# Patient Record
Sex: Female | Born: 1950 | Race: White | Hispanic: No | Smoking: Current every day smoker
Health system: Southern US, Community
[De-identification: ages and names within clinical notes are randomized; demographics above are authoritative.]

## PROBLEM LIST (undated history)

## (undated) ENCOUNTER — Emergency Department (HOSPITAL_COMMUNITY): Admission: EM | Payer: Medicare Other | Source: Home / Self Care

## (undated) DIAGNOSIS — Z9989 Dependence on other enabling machines and devices: Secondary | ICD-10-CM

## (undated) DIAGNOSIS — F319 Bipolar disorder, unspecified: Secondary | ICD-10-CM

## (undated) DIAGNOSIS — Z9289 Personal history of other medical treatment: Secondary | ICD-10-CM

## (undated) DIAGNOSIS — R413 Other amnesia: Secondary | ICD-10-CM

## (undated) DIAGNOSIS — G4733 Obstructive sleep apnea (adult) (pediatric): Secondary | ICD-10-CM

## (undated) DIAGNOSIS — F445 Conversion disorder with seizures or convulsions: Secondary | ICD-10-CM

## (undated) DIAGNOSIS — J189 Pneumonia, unspecified organism: Secondary | ICD-10-CM

## (undated) DIAGNOSIS — E039 Hypothyroidism, unspecified: Secondary | ICD-10-CM

## (undated) DIAGNOSIS — F329 Major depressive disorder, single episode, unspecified: Secondary | ICD-10-CM

## (undated) DIAGNOSIS — E785 Hyperlipidemia, unspecified: Secondary | ICD-10-CM

## (undated) DIAGNOSIS — M545 Low back pain, unspecified: Secondary | ICD-10-CM

## (undated) DIAGNOSIS — H3320 Serous retinal detachment, unspecified eye: Secondary | ICD-10-CM

## (undated) DIAGNOSIS — C55 Malignant neoplasm of uterus, part unspecified: Secondary | ICD-10-CM

## (undated) DIAGNOSIS — E119 Type 2 diabetes mellitus without complications: Secondary | ICD-10-CM

## (undated) DIAGNOSIS — F32A Depression, unspecified: Secondary | ICD-10-CM

## (undated) DIAGNOSIS — J42 Unspecified chronic bronchitis: Secondary | ICD-10-CM

## (undated) DIAGNOSIS — F419 Anxiety disorder, unspecified: Secondary | ICD-10-CM

## (undated) DIAGNOSIS — K219 Gastro-esophageal reflux disease without esophagitis: Secondary | ICD-10-CM

## (undated) DIAGNOSIS — B019 Varicella without complication: Secondary | ICD-10-CM

## (undated) DIAGNOSIS — G8929 Other chronic pain: Secondary | ICD-10-CM

## (undated) HISTORY — PX: EYE SURGERY: SHX253

## (undated) HISTORY — DX: Other amnesia: R41.3

## (undated) HISTORY — PX: VAGINAL HYSTERECTOMY: SUR661

## (undated) HISTORY — PX: DILATION AND CURETTAGE OF UTERUS: SHX78

## (undated) HISTORY — PX: BACK SURGERY: SHX140

## (undated) HISTORY — DX: Major depressive disorder, single episode, unspecified: F32.9

## (undated) HISTORY — PX: SALPINGOOPHORECTOMY: SHX82

## (undated) HISTORY — PX: TONSILLECTOMY: SUR1361

## (undated) HISTORY — DX: Varicella without complication: B01.9

## (undated) HISTORY — DX: Depression, unspecified: F32.A

---

## 2001-12-31 ENCOUNTER — Encounter: Admission: RE | Admit: 2001-12-31 | Discharge: 2001-12-31 | Payer: Self-pay | Admitting: Internal Medicine

## 2002-03-04 ENCOUNTER — Encounter: Admission: RE | Admit: 2002-03-04 | Discharge: 2002-03-04 | Payer: Self-pay | Admitting: Internal Medicine

## 2002-03-23 ENCOUNTER — Encounter: Admission: RE | Admit: 2002-03-23 | Discharge: 2002-03-23 | Payer: Self-pay | Admitting: Internal Medicine

## 2002-03-26 ENCOUNTER — Ambulatory Visit (HOSPITAL_COMMUNITY): Admission: RE | Admit: 2002-03-26 | Discharge: 2002-03-26 | Payer: Self-pay | Admitting: Internal Medicine

## 2002-03-26 ENCOUNTER — Encounter: Payer: Self-pay | Admitting: Internal Medicine

## 2002-03-30 ENCOUNTER — Encounter: Admission: RE | Admit: 2002-03-30 | Discharge: 2002-03-30 | Payer: Self-pay | Admitting: Internal Medicine

## 2002-04-15 ENCOUNTER — Encounter: Payer: Self-pay | Admitting: Neurosurgery

## 2002-04-19 ENCOUNTER — Inpatient Hospital Stay (HOSPITAL_COMMUNITY): Admission: RE | Admit: 2002-04-19 | Discharge: 2002-04-21 | Payer: Self-pay | Admitting: Neurosurgery

## 2002-04-19 ENCOUNTER — Encounter: Payer: Self-pay | Admitting: Neurosurgery

## 2002-05-02 ENCOUNTER — Encounter: Admission: RE | Admit: 2002-05-02 | Discharge: 2002-05-02 | Payer: Self-pay | Admitting: Internal Medicine

## 2002-05-02 ENCOUNTER — Ambulatory Visit (HOSPITAL_COMMUNITY): Admission: RE | Admit: 2002-05-02 | Discharge: 2002-05-02 | Payer: Self-pay | Admitting: Internal Medicine

## 2002-05-13 ENCOUNTER — Ambulatory Visit (HOSPITAL_COMMUNITY): Admission: RE | Admit: 2002-05-13 | Discharge: 2002-05-13 | Payer: Self-pay | Admitting: Internal Medicine

## 2002-05-13 ENCOUNTER — Encounter: Admission: RE | Admit: 2002-05-13 | Discharge: 2002-05-13 | Payer: Self-pay | Admitting: Internal Medicine

## 2002-05-16 ENCOUNTER — Encounter: Payer: Self-pay | Admitting: Internal Medicine

## 2002-05-16 ENCOUNTER — Ambulatory Visit (HOSPITAL_COMMUNITY): Admission: RE | Admit: 2002-05-16 | Discharge: 2002-05-16 | Payer: Self-pay | Admitting: Internal Medicine

## 2002-05-31 ENCOUNTER — Encounter: Admission: RE | Admit: 2002-05-31 | Discharge: 2002-08-25 | Payer: Self-pay | Admitting: Neurosurgery

## 2002-06-06 ENCOUNTER — Encounter: Admission: RE | Admit: 2002-06-06 | Discharge: 2002-06-06 | Payer: Self-pay | Admitting: Internal Medicine

## 2002-06-07 ENCOUNTER — Encounter: Admission: RE | Admit: 2002-06-07 | Discharge: 2002-06-07 | Payer: Self-pay | Admitting: Internal Medicine

## 2002-08-03 ENCOUNTER — Encounter: Admission: RE | Admit: 2002-08-03 | Discharge: 2002-08-03 | Payer: Self-pay | Admitting: Internal Medicine

## 2002-10-12 ENCOUNTER — Encounter: Admission: RE | Admit: 2002-10-12 | Discharge: 2002-10-12 | Payer: Self-pay | Admitting: Internal Medicine

## 2002-10-20 ENCOUNTER — Encounter: Admission: RE | Admit: 2002-10-20 | Discharge: 2002-10-20 | Payer: Self-pay | Admitting: Internal Medicine

## 2002-10-25 ENCOUNTER — Emergency Department (HOSPITAL_COMMUNITY): Admission: EM | Admit: 2002-10-25 | Discharge: 2002-10-26 | Payer: Self-pay | Admitting: Emergency Medicine

## 2002-10-26 ENCOUNTER — Encounter: Payer: Self-pay | Admitting: Emergency Medicine

## 2002-11-02 ENCOUNTER — Ambulatory Visit (HOSPITAL_COMMUNITY): Admission: RE | Admit: 2002-11-02 | Discharge: 2002-11-02 | Payer: Self-pay | Admitting: Internal Medicine

## 2002-11-02 ENCOUNTER — Encounter: Payer: Self-pay | Admitting: Cardiology

## 2002-11-02 ENCOUNTER — Encounter: Admission: RE | Admit: 2002-11-02 | Discharge: 2002-11-02 | Payer: Self-pay | Admitting: Internal Medicine

## 2002-11-16 ENCOUNTER — Emergency Department (HOSPITAL_COMMUNITY): Admission: EM | Admit: 2002-11-16 | Discharge: 2002-11-16 | Payer: Self-pay | Admitting: Emergency Medicine

## 2002-12-12 ENCOUNTER — Ambulatory Visit (HOSPITAL_BASED_OUTPATIENT_CLINIC_OR_DEPARTMENT_OTHER): Admission: RE | Admit: 2002-12-12 | Discharge: 2002-12-12 | Payer: Self-pay | Admitting: Internal Medicine

## 2002-12-16 ENCOUNTER — Encounter: Admission: RE | Admit: 2002-12-16 | Discharge: 2002-12-16 | Payer: Self-pay | Admitting: Internal Medicine

## 2003-02-20 ENCOUNTER — Emergency Department (HOSPITAL_COMMUNITY): Admission: EM | Admit: 2003-02-20 | Discharge: 2003-02-20 | Payer: Self-pay | Admitting: Emergency Medicine

## 2003-02-27 ENCOUNTER — Encounter: Admission: RE | Admit: 2003-02-27 | Discharge: 2003-02-27 | Payer: Self-pay | Admitting: Internal Medicine

## 2003-03-01 ENCOUNTER — Ambulatory Visit (HOSPITAL_COMMUNITY): Admission: RE | Admit: 2003-03-01 | Discharge: 2003-03-01 | Payer: Self-pay | Admitting: Internal Medicine

## 2003-03-08 ENCOUNTER — Encounter: Admission: RE | Admit: 2003-03-08 | Discharge: 2003-03-08 | Payer: Self-pay | Admitting: Internal Medicine

## 2003-03-09 ENCOUNTER — Ambulatory Visit (HOSPITAL_COMMUNITY): Admission: RE | Admit: 2003-03-09 | Discharge: 2003-03-09 | Payer: Self-pay | Admitting: Internal Medicine

## 2003-04-10 ENCOUNTER — Encounter: Admission: RE | Admit: 2003-04-10 | Discharge: 2003-04-10 | Payer: Self-pay | Admitting: Internal Medicine

## 2003-04-27 ENCOUNTER — Encounter: Admission: RE | Admit: 2003-04-27 | Discharge: 2003-04-27 | Payer: Self-pay | Admitting: Neurosurgery

## 2003-05-16 ENCOUNTER — Encounter: Admission: RE | Admit: 2003-05-16 | Discharge: 2003-05-16 | Payer: Self-pay | Admitting: Neurosurgery

## 2003-07-13 ENCOUNTER — Inpatient Hospital Stay (HOSPITAL_COMMUNITY): Admission: AD | Admit: 2003-07-13 | Discharge: 2003-07-16 | Payer: Self-pay | Admitting: Internal Medicine

## 2003-07-19 ENCOUNTER — Encounter: Admission: RE | Admit: 2003-07-19 | Discharge: 2003-07-19 | Payer: Self-pay | Admitting: Internal Medicine

## 2003-09-28 ENCOUNTER — Encounter: Admission: RE | Admit: 2003-09-28 | Discharge: 2003-09-28 | Payer: Self-pay | Admitting: Internal Medicine

## 2003-10-17 ENCOUNTER — Encounter: Admission: RE | Admit: 2003-10-17 | Discharge: 2003-10-17 | Payer: Self-pay | Admitting: Internal Medicine

## 2004-02-28 ENCOUNTER — Encounter: Admission: RE | Admit: 2004-02-28 | Discharge: 2004-02-28 | Payer: Self-pay | Admitting: Neurosurgery

## 2004-03-07 ENCOUNTER — Emergency Department (HOSPITAL_COMMUNITY): Admission: EM | Admit: 2004-03-07 | Discharge: 2004-03-07 | Payer: Self-pay | Admitting: Emergency Medicine

## 2004-03-23 ENCOUNTER — Ambulatory Visit: Payer: Self-pay | Admitting: Internal Medicine

## 2004-03-23 ENCOUNTER — Inpatient Hospital Stay (HOSPITAL_COMMUNITY): Admission: EM | Admit: 2004-03-23 | Discharge: 2004-03-26 | Payer: Self-pay | Admitting: Emergency Medicine

## 2004-05-14 ENCOUNTER — Ambulatory Visit: Payer: Self-pay | Admitting: Internal Medicine

## 2004-10-17 ENCOUNTER — Emergency Department (HOSPITAL_COMMUNITY): Admission: EM | Admit: 2004-10-17 | Discharge: 2004-10-18 | Payer: Self-pay | Admitting: Emergency Medicine

## 2004-10-22 ENCOUNTER — Ambulatory Visit: Payer: Self-pay | Admitting: Internal Medicine

## 2004-10-22 ENCOUNTER — Inpatient Hospital Stay (HOSPITAL_COMMUNITY): Admission: AD | Admit: 2004-10-22 | Discharge: 2004-10-23 | Payer: Self-pay | Admitting: Internal Medicine

## 2004-10-23 ENCOUNTER — Ambulatory Visit: Payer: Self-pay | Admitting: Internal Medicine

## 2004-10-23 ENCOUNTER — Ambulatory Visit: Payer: Self-pay | Admitting: Cardiovascular Disease

## 2004-10-29 ENCOUNTER — Ambulatory Visit: Payer: Self-pay | Admitting: Internal Medicine

## 2004-12-06 ENCOUNTER — Emergency Department (HOSPITAL_COMMUNITY): Admission: EM | Admit: 2004-12-06 | Discharge: 2004-12-06 | Payer: Self-pay | Admitting: Emergency Medicine

## 2005-02-18 ENCOUNTER — Encounter: Admission: RE | Admit: 2005-02-18 | Discharge: 2005-02-18 | Payer: Self-pay | Admitting: Neurosurgery

## 2005-03-07 ENCOUNTER — Inpatient Hospital Stay (HOSPITAL_COMMUNITY): Admission: EM | Admit: 2005-03-07 | Discharge: 2005-03-17 | Payer: Self-pay | Admitting: Psychiatry

## 2005-03-08 ENCOUNTER — Ambulatory Visit: Payer: Self-pay | Admitting: Psychiatry

## 2005-04-04 ENCOUNTER — Emergency Department (HOSPITAL_COMMUNITY): Admission: EM | Admit: 2005-04-04 | Discharge: 2005-04-04 | Payer: Self-pay | Admitting: Emergency Medicine

## 2005-04-18 ENCOUNTER — Emergency Department (HOSPITAL_COMMUNITY): Admission: EM | Admit: 2005-04-18 | Discharge: 2005-04-18 | Payer: Self-pay | Admitting: Family Medicine

## 2005-05-10 ENCOUNTER — Emergency Department (HOSPITAL_COMMUNITY): Admission: EM | Admit: 2005-05-10 | Discharge: 2005-05-11 | Payer: Self-pay | Admitting: Emergency Medicine

## 2005-07-02 ENCOUNTER — Emergency Department (HOSPITAL_COMMUNITY): Admission: EM | Admit: 2005-07-02 | Discharge: 2005-07-03 | Payer: Self-pay | Admitting: Emergency Medicine

## 2006-02-18 ENCOUNTER — Encounter: Admission: RE | Admit: 2006-02-18 | Discharge: 2006-02-18 | Payer: Self-pay | Admitting: Neurosurgery

## 2006-05-11 ENCOUNTER — Other Ambulatory Visit: Admission: RE | Admit: 2006-05-11 | Discharge: 2006-05-11 | Payer: Self-pay | Admitting: Obstetrics and Gynecology

## 2006-05-13 ENCOUNTER — Emergency Department (HOSPITAL_COMMUNITY): Admission: EM | Admit: 2006-05-13 | Discharge: 2006-05-14 | Payer: Self-pay | Admitting: Emergency Medicine

## 2006-05-22 ENCOUNTER — Ambulatory Visit: Payer: Self-pay | Admitting: *Deleted

## 2006-05-22 ENCOUNTER — Inpatient Hospital Stay (HOSPITAL_COMMUNITY): Admission: AD | Admit: 2006-05-22 | Discharge: 2006-05-26 | Payer: Self-pay | Admitting: *Deleted

## 2006-08-17 ENCOUNTER — Emergency Department (HOSPITAL_COMMUNITY): Admission: EM | Admit: 2006-08-17 | Discharge: 2006-08-17 | Payer: Self-pay | Admitting: Family Medicine

## 2007-06-14 ENCOUNTER — Encounter (INDEPENDENT_AMBULATORY_CARE_PROVIDER_SITE_OTHER): Payer: Self-pay | Admitting: Family Medicine

## 2007-06-14 ENCOUNTER — Ambulatory Visit: Payer: Self-pay | Admitting: Internal Medicine

## 2007-06-14 LAB — CONVERTED CEMR LAB
AST: 42 units/L — ABNORMAL HIGH (ref 0–37)
Albumin: 4.3 g/dL (ref 3.5–5.2)
Alkaline Phosphatase: 49 units/L (ref 39–117)
BUN: 25 mg/dL — ABNORMAL HIGH (ref 6–23)
Basophils Relative: 0 % (ref 0–1)
CO2: 23 meq/L (ref 19–32)
Cholesterol: 279 mg/dL — ABNORMAL HIGH (ref 0–200)
Creatinine, Ser: 0.66 mg/dL (ref 0.40–1.20)
Eosinophils Absolute: 0.1 10*3/uL (ref 0.0–0.7)
Glucose, Bld: 120 mg/dL — ABNORMAL HIGH (ref 70–99)
HCT: 38.8 % (ref 36.0–46.0)
Hemoglobin: 12.4 g/dL (ref 12.0–15.0)
LDL Cholesterol: 172 mg/dL — ABNORMAL HIGH (ref 0–99)
Lymphocytes Relative: 34 % (ref 12–46)
Monocytes Absolute: 0.5 10*3/uL (ref 0.1–1.0)
Neutro Abs: 4.4 10*3/uL (ref 1.7–7.7)
Neutrophils Relative %: 58 % (ref 43–77)
Platelets: 210 10*3/uL (ref 150–400)
RDW: 12.9 % (ref 11.5–15.5)
Sodium: 141 meq/L (ref 135–145)
Total Bilirubin: 0.3 mg/dL (ref 0.3–1.2)
Triglycerides: 276 mg/dL — ABNORMAL HIGH (ref <150)

## 2007-06-16 ENCOUNTER — Encounter (INDEPENDENT_AMBULATORY_CARE_PROVIDER_SITE_OTHER): Payer: Self-pay | Admitting: Family Medicine

## 2007-06-16 LAB — CONVERTED CEMR LAB
HCV Ab: NEGATIVE
Hepatitis B-Post: 348 milliintl units/mL

## 2007-06-18 ENCOUNTER — Encounter (INDEPENDENT_AMBULATORY_CARE_PROVIDER_SITE_OTHER): Payer: Self-pay | Admitting: Family Medicine

## 2007-08-03 ENCOUNTER — Ambulatory Visit: Payer: Self-pay | Admitting: Internal Medicine

## 2007-08-03 ENCOUNTER — Encounter (INDEPENDENT_AMBULATORY_CARE_PROVIDER_SITE_OTHER): Payer: Self-pay | Admitting: Family Medicine

## 2007-08-03 LAB — CONVERTED CEMR LAB
Total CHOL/HDL Ratio: 5.3
Triglycerides: 256 mg/dL — ABNORMAL HIGH (ref <150)

## 2007-08-18 ENCOUNTER — Encounter: Payer: Self-pay | Admitting: Family Medicine

## 2007-08-18 ENCOUNTER — Ambulatory Visit: Payer: Self-pay | Admitting: Internal Medicine

## 2007-08-18 LAB — CONVERTED CEMR LAB
Alkaline Phosphatase: 56 units/L (ref 39–117)
Basophils Relative: 0 % (ref 0–1)
CO2: 24 meq/L (ref 19–32)
Chloride: 102 meq/L (ref 96–112)
Creatinine, Ser: 0.84 mg/dL (ref 0.40–1.20)
Eosinophils Relative: 1 % (ref 0–5)
Glucose, Bld: 99 mg/dL (ref 70–99)
HCT: 42.7 % (ref 36.0–46.0)
MCV: 97.9 fL (ref 78.0–100.0)
Monocytes Relative: 10 % (ref 3–12)
RBC: 4.36 M/uL (ref 3.87–5.11)
Sed Rate: 2 mm/hr (ref 0–22)
Sodium: 140 meq/L (ref 135–145)
TSH: 2.571 microintl units/mL (ref 0.350–4.50)
Total Protein: 7.6 g/dL (ref 6.0–8.3)
Vitamin B-12: 550 pg/mL (ref 211–911)
WBC: 7 10*3/uL (ref 4.0–10.5)

## 2007-08-26 ENCOUNTER — Ambulatory Visit (HOSPITAL_COMMUNITY): Admission: RE | Admit: 2007-08-26 | Discharge: 2007-08-26 | Payer: Self-pay | Admitting: Family Medicine

## 2007-09-14 ENCOUNTER — Ambulatory Visit: Payer: Self-pay | Admitting: Internal Medicine

## 2007-09-14 ENCOUNTER — Encounter: Payer: Self-pay | Admitting: Family Medicine

## 2007-09-17 ENCOUNTER — Encounter: Admission: RE | Admit: 2007-09-17 | Discharge: 2007-09-17 | Payer: Self-pay | Admitting: Family Medicine

## 2008-01-30 ENCOUNTER — Inpatient Hospital Stay (HOSPITAL_COMMUNITY): Admission: EM | Admit: 2008-01-30 | Discharge: 2008-01-31 | Payer: Self-pay | Admitting: Emergency Medicine

## 2008-05-08 ENCOUNTER — Ambulatory Visit: Payer: Self-pay | Admitting: Internal Medicine

## 2008-05-08 ENCOUNTER — Encounter: Payer: Self-pay | Admitting: Family Medicine

## 2008-05-08 LAB — CONVERTED CEMR LAB
ALT: 42 units/L — ABNORMAL HIGH (ref 0–35)
AST: 29 units/L (ref 0–37)
BUN: 20 mg/dL (ref 6–23)
Basophils Relative: 0 % (ref 0–1)
Chloride: 105 meq/L (ref 96–112)
Creatinine, Ser: 0.65 mg/dL (ref 0.40–1.20)
Eosinophils Absolute: 0.1 10*3/uL (ref 0.0–0.7)
Eosinophils Relative: 2 % (ref 0–5)
HCT: 41.6 % (ref 36.0–46.0)
Lymphocytes Relative: 33 % (ref 12–46)
Lymphs Abs: 2.4 10*3/uL (ref 0.7–4.0)
MCV: 95.6 fL (ref 78.0–100.0)
Monocytes Absolute: 0.7 10*3/uL (ref 0.1–1.0)
Monocytes Relative: 9 % (ref 3–12)
Neutro Abs: 4.2 10*3/uL (ref 1.7–7.7)
Neutrophils Relative %: 57 % (ref 43–77)
Potassium: 4.8 meq/L (ref 3.5–5.3)
RDW: 13.5 % (ref 11.5–15.5)
Valproic Acid Lvl: 132.2 ug/mL — ABNORMAL HIGH (ref 50.0–100.0)

## 2008-07-21 ENCOUNTER — Ambulatory Visit: Payer: Self-pay | Admitting: Internal Medicine

## 2008-09-29 ENCOUNTER — Emergency Department (HOSPITAL_COMMUNITY): Admission: EM | Admit: 2008-09-29 | Discharge: 2008-09-29 | Payer: Self-pay | Admitting: Emergency Medicine

## 2009-03-19 ENCOUNTER — Emergency Department (HOSPITAL_COMMUNITY): Admission: EM | Admit: 2009-03-19 | Discharge: 2009-03-20 | Payer: Self-pay | Admitting: Emergency Medicine

## 2009-04-05 DIAGNOSIS — G4730 Sleep apnea, unspecified: Secondary | ICD-10-CM | POA: Insufficient documentation

## 2009-08-08 ENCOUNTER — Encounter: Admission: RE | Admit: 2009-08-08 | Discharge: 2009-08-08 | Payer: Self-pay | Admitting: Family Medicine

## 2009-09-13 DIAGNOSIS — H548 Legal blindness, as defined in USA: Secondary | ICD-10-CM | POA: Insufficient documentation

## 2010-03-10 ENCOUNTER — Encounter: Payer: Self-pay | Admitting: Obstetrics and Gynecology

## 2010-03-10 ENCOUNTER — Encounter: Payer: Self-pay | Admitting: Neurosurgery

## 2010-05-08 LAB — URINE CULTURE

## 2010-05-08 LAB — URINE MICROSCOPIC-ADD ON

## 2010-05-08 LAB — URINALYSIS, ROUTINE W REFLEX MICROSCOPIC
Hgb urine dipstick: NEGATIVE
Ketones, ur: 15 mg/dL — AB
Nitrite: NEGATIVE
Protein, ur: NEGATIVE mg/dL
Specific Gravity, Urine: 1.017 (ref 1.005–1.030)
Urobilinogen, UA: 0.2 mg/dL (ref 0.0–1.0)
pH: 6.5 (ref 5.0–8.0)
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
## 2010-07-05 NOTE — Op Note (Signed)
NAME:  Kelsey Hanna, Kelsey Hanna                         ACCOUNT NO.:  [id number]   MEDICAL RECORD NO.:  [id number]                   PATIENT TYPE:  INP   LOCATION:  2899                                 FACILITY:  [facility]   PHYSICIAN:  Hilda Lias, M.D.                DATE OF BIRTH:  Oct 08, 1950   DATE OF PROCEDURE:  04/19/2002  DATE OF DISCHARGE:                                 OPERATIVE REPORT   PREOPERATIVE DIAGNOSIS:  L5-S1 herniated disk bilaterally, left worse than  right with 1/5 to 2/5 weakness of dorsiflexion and plantar flexion of the  left foot.   POSTOPERATIVE DIAGNOSIS:  L5-S1 herniated disk bilaterally, left worse than  right with 1/5 to 2/5 weakness of dorsiflexion and plantar flexion of the  left foot.   PROCEDURE:  Bilateral L5-S1 diskectomy, foraminotomy, decompression of the  L4 and L5 nerve roots, microscope.   SURGEON:  Hilda Lias, M.D.   INDICATIONS FOR PROCEDURE:  The patient is admitted because of back pain  with radiation to both legs, left worse than right, associated with weakness  of dorsiflexion and plantar flexion.  MRI showed herniated disk compromising  the L5 and S1 nerve roots.  Surgery was advised and the risks were explained  in history and physical.   DESCRIPTION OF PROCEDURE:  The patient was taken to the OR and positioned in  a prone manner.  X-ray had to be taken because she is quite a heavy lady.  Indeed this showed that we were at the level of 5-1.  Then an incision in  the midline through the skin, subcutaneous tissue, and fascia was done.  Muscle was retracted bilaterally.  First on the right side, we were able to  see the L5-S1 space.  The procedure was done on the left side.  We brought  the drill and we drilled the lower lamina of L5 and the upper of S1.  Because the pain and weakness were worse on the right side, we started on  the right side.  The yellow ligament was also excised. This was done with  the microscope. We found that the  S1 nerve root and the L5 were conjoint.  Indeed there was a herniated disk at 5-1 compromising the takeoff of S1 and  also the takeoff of L5.  Retraction was done after lysis and incision was  made.  Large amounts of degenerative disk were removed. There was a lot of  osteophyte compromising the L5 nerve root.  Median and lateral diskectomy  was achieved. The same procedure was done on the left side when the disk was  also opened and total gross diskectomy was achieved. At the end we had good  decompression with plenty of room for the L5 and S1 nerve roots bilaterally.  Valsalva maneuver was negative.  From then on, the area was irrigated.  Depo-  Medrol was placed and the wound was closed with  Vicryl and Steri-Strips.                                               Hilda Lias, M.D.    EB/MEDQ  D:  04/19/2002  T:  04/19/2002  Job:  [id number]

## 2010-07-05 NOTE — H&P (Signed)
NAME:  Kelsey Hanna, Kelsey Hanna NO.:  [id number]   MEDICAL RECORD NO.:  [id number]          PATIENT TYPE:  IPS   LOCATION:  0401                          [facility]ILITY[facility]  [facility]   PHYSICIAN:  Jasmine Pang, M.D. DATE OF BIRTH:  April 09, 1950   DATE OF ADMISSION:  05/22/2006  DATE OF DISCHARGE:                       PSYCHIATRIC ADMISSION ASSESSMENT   IDENTIFYING INFORMATION:  This is a voluntary admission to the services  of Dr. Milford Cage.  This is a 60 year old divorced white female.  Apparently, she is at [facility] and was sent for a  medical clearance to the ED at [facility].  Apparently, she had been  calling her psychiatrist, Dr. Rosalia Hammers, stating that she could not rest, I  can hear everything, I can understand other languages with my hands.  The patient has a significant history for chronic back pain,  hypertension, hypothyroidism and is known to be bipolar.   PAST PSYCHIATRIC HISTORY:  She was last with [place] March 07, 2005 to  March 17, 2005.  At that time, she had become noncompliant with her  Depakote and was also manic at that time.   SOCIAL HISTORY:  She states that she went to the 12th grade.  She has  been married four times.  She has a son and a daughter.  She is not sure  of their ages.  She claims that she does not know anything.  According  to her past record here, it states that she had been living with her  daughter for up to three years and also had been living in a group home  and may be unable to continue living with her daughter and need to find  appropriate placement.  This was back in 2007.  Her hospitalization was  in [facility] at [facility] as far as we can tell and she was noted to be  allergic to LITHIUM.   FAMILY HISTORY:  She denies.   ALCOHOL/DRUG HISTORY:  She is in an assisted-living.  She does not have  access.   PRIMARY CARE PHYSICIAN:  Primary care Aadhya Bustamante is a Dr. Redmond School.  Her  psychiatrist is Dr. Rosalia Hammers.   MEDICAL  PROBLEMS:  Hypothyroidism and sleep apnea.   MEDICATIONS:  Numerous.  She is on tramadol 50 mg, 1 tablet t.i.d.,  meclizine 12.5 mg, 1 tablet t.i.d., Calcarb 600 with Vitamin D, 1 tablet  t.i.d., simvastatin 10 mg, 1 at h.s., Fiber-Lax 1 tab q.d. with 80  ounces of water, diphenhydramine 50 mg, take 1 tablet by mouth as needed  for nausea, fluoxetine 40 mg every morning, Levothroid 150 mcg, 1 tablet  by mouth q.d., Topamax 100 mg, take 1 b.i.d., daily-Vite 1 p.o. q.d.,  gabapentin 600 mg b.i.d., arthritis pain relief 650 mg, take 1 tablet by  mouth q.12h., diclofenac or Voltaren 75 mg b.i.d., docusate 100 mg  b.i.d.   ALLERGIES:  LITHIUM.   POSITIVE PHYSICAL FINDINGS:  She was medically cleared in the ED at  [facility].  There were no remarkable findings.  Her vital  signs on admission show she is 61 inches tall, she weighs 171  pounds,  temperature 97.3, blood pressure 149/92, pulse 99, respirations 20.  She  does have a lumbar area scar consistent with her history for back  surgery.   LABORATORY DATA:  Her labs were unremarkable.   MENTAL STATUS EXAM:  She is alert.  She is not oriented.  She is  appropriately groomed, dressed and nourished.  Her speech is rapid.  She  has some flight of ideas.  Her mood is labile.  Her thought processes  are not clear, rational and goal-oriented.  Judgment and insight are  poor.  Concentration and memory are poor.  Intelligence is at least  average.  She denies being suicidal or homicidal at the moment.  She is  denying any auditory or visual hallucinations.  She is not responding to  any hallucinations nor does she demonstrate tactile hallucinations at  the moment.   DIAGNOSES:  AXIS I:  Bipolar, manic with psychotic features, auditory  hallucinations and tactile hallucinations.  AXIS II:  Deferred.  AXIS III:  Hypothyroidism and sleep apnea.  AXIS IV:  Problems with primary support group.  AXIS V:  25.   PLAN:  We will adjust her  medications.  Toward that end, we will add  some Keppra as a mood stabilizer and continue with her Risperdal as  ordered.   ESTIMATED LENGTH OF STAY:  Four to five days.      Mickie Leonarda Salon, P.A.-C.      Jasmine Pang, M.D.  Electronically Signed    MD/MEDQ  D:  05/23/2006  T:  05/23/2006  Job:  [id number]

## 2010-07-29 ENCOUNTER — Emergency Department (HOSPITAL_COMMUNITY)
Admission: EM | Admit: 2010-07-29 | Discharge: 2010-07-29 | Disposition: A | Discharge status: Home / Self Care | Payer: Medicare Other | Attending: Emergency Medicine | Admitting: Emergency Medicine

## 2010-07-29 DIAGNOSIS — I10 Essential (primary) hypertension: Secondary | ICD-10-CM | POA: Insufficient documentation

## 2010-07-29 DIAGNOSIS — F3130 Bipolar disorder, current episode depressed, mild or moderate severity, unspecified: Secondary | ICD-10-CM | POA: Insufficient documentation

## 2010-07-29 DIAGNOSIS — IMO0001 Reserved for inherently not codable concepts without codable children: Secondary | ICD-10-CM | POA: Insufficient documentation

## 2010-07-29 DIAGNOSIS — Z8542 Personal history of malignant neoplasm of other parts of uterus: Secondary | ICD-10-CM | POA: Insufficient documentation

## 2010-07-29 DIAGNOSIS — G40909 Epilepsy, unspecified, not intractable, without status epilepticus: Secondary | ICD-10-CM | POA: Insufficient documentation

## 2010-07-29 DIAGNOSIS — E039 Hypothyroidism, unspecified: Secondary | ICD-10-CM | POA: Insufficient documentation

## 2010-07-29 LAB — POCT I-STAT, CHEM 8
BUN: 16 mg/dL (ref 6–23)
Creatinine, Ser: 0.6 mg/dL (ref 0.4–1.2)

## 2010-09-03 ENCOUNTER — Encounter: Payer: Medicare Other | Admitting: Vascular Surgery

## 2010-09-12 ENCOUNTER — Other Ambulatory Visit: Payer: Self-pay | Admitting: Neurology

## 2010-09-12 DIAGNOSIS — R209 Unspecified disturbances of skin sensation: Secondary | ICD-10-CM

## 2010-09-12 DIAGNOSIS — G319 Degenerative disease of nervous system, unspecified: Secondary | ICD-10-CM

## 2010-09-12 DIAGNOSIS — F319 Bipolar disorder, unspecified: Secondary | ICD-10-CM

## 2010-09-12 DIAGNOSIS — G252 Other specified forms of tremor: Secondary | ICD-10-CM

## 2010-09-24 ENCOUNTER — Other Ambulatory Visit: Payer: Medicare Other

## 2010-11-21 LAB — MAGNESIUM: Magnesium: 1.9 mg/dL (ref 1.5–2.5)

## 2010-11-21 LAB — RAPID URINE DRUG SCREEN, HOSP PERFORMED
Amphetamines: NOT DETECTED
Benzodiazepines: NOT DETECTED
Cocaine: NOT DETECTED
Opiates: NOT DETECTED
Tetrahydrocannabinol: NOT DETECTED

## 2010-11-21 LAB — BASIC METABOLIC PANEL
BUN: 17 mg/dL (ref 6–23)
BUN: 27 mg/dL — ABNORMAL HIGH (ref 6–23)
CO2: 26 mEq/L (ref 19–32)
CO2: 27 mEq/L (ref 19–32)
Calcium: 8.7 mg/dL (ref 8.4–10.5)
Chloride: 102 mEq/L (ref 96–112)
Creatinine, Ser: 0.76 mg/dL (ref 0.4–1.2)
Creatinine, Ser: 0.84 mg/dL (ref 0.4–1.2)
Creatinine, Ser: 0.99 mg/dL (ref 0.4–1.2)
GFR calc Af Amer: >60 mL/min (ref >60)
GFR calc non Af Amer: >60 mL/min (ref >60)
Glucose, Bld: 114 mg/dL — ABNORMAL HIGH (ref 70–99)
Potassium: 4.2 mEq/L (ref 3.5–5.1)

## 2010-11-21 LAB — CBC
HCT: 33.8 % — ABNORMAL LOW (ref 36.0–46.0)
Hemoglobin: 11.5 g/dL — ABNORMAL LOW (ref 12.0–15.0)
MCHC: 33.9 g/dL (ref 30.0–36.0)
MCV: 94.4 fL (ref 78.0–100.0)
Platelets: 210 10*3/uL (ref 150–400)
RBC: 3.58 MIL/uL — ABNORMAL LOW (ref 3.87–5.11)
WBC: 11.4 10*3/uL — ABNORMAL HIGH (ref 4.0–10.5)

## 2010-11-21 LAB — VALPROIC ACID LEVEL: Valproic Acid Lvl: 59.9 ug/mL (ref 50.0–100.0)

## 2010-11-21 LAB — CARDIAC PANEL(CRET KIN+CKTOT+MB+TROPI)
CK, MB: 1.2 ng/mL (ref 0.3–4.0)
CK, MB: 1.7 ng/mL (ref 0.3–4.0)
Relative Index: INVALID (ref 0.0–2.5)
Relative Index: INVALID (ref 0.0–2.5)
Relative Index: INVALID (ref 0.0–2.5)
Total CK: 48 U/L (ref 7–177)
Troponin I: <0.01 ng/mL (ref 0.00–0.06)
Troponin I: <0.01 ng/mL (ref 0.00–0.06)

## 2010-11-21 LAB — DIFFERENTIAL
Basophils Absolute: 0 10*3/uL (ref 0.0–0.1)
Eosinophils Absolute: 0 10*3/uL (ref 0.0–0.7)
Lymphocytes Relative: 19 % (ref 12–46)
Lymphs Abs: 2.2 10*3/uL (ref 0.7–4.0)
Neutrophils Relative %: 75 % (ref 43–77)

## 2010-11-21 LAB — VITAMIN B12: Vitamin B-12: 405 pg/mL (ref 211–911)

## 2010-11-21 LAB — CK TOTAL AND CKMB (NOT AT ARMC): CK, MB: 2 ng/mL (ref 0.3–4.0)

## 2010-12-01 ENCOUNTER — Emergency Department (HOSPITAL_COMMUNITY)
Admission: EM | Admit: 2010-12-01 | Discharge: 2010-12-02 | Disposition: A | Discharge status: Home / Self Care | Payer: Medicare Other | Attending: Emergency Medicine | Admitting: Emergency Medicine

## 2010-12-01 DIAGNOSIS — H548 Legal blindness, as defined in USA: Secondary | ICD-10-CM | POA: Insufficient documentation

## 2010-12-01 DIAGNOSIS — M545 Low back pain, unspecified: Secondary | ICD-10-CM | POA: Insufficient documentation

## 2010-12-01 DIAGNOSIS — F3130 Bipolar disorder, current episode depressed, mild or moderate severity, unspecified: Secondary | ICD-10-CM | POA: Insufficient documentation

## 2010-12-01 DIAGNOSIS — G40909 Epilepsy, unspecified, not intractable, without status epilepticus: Secondary | ICD-10-CM | POA: Insufficient documentation

## 2010-12-01 DIAGNOSIS — E039 Hypothyroidism, unspecified: Secondary | ICD-10-CM | POA: Insufficient documentation

## 2010-12-01 DIAGNOSIS — Z8542 Personal history of malignant neoplasm of other parts of uterus: Secondary | ICD-10-CM | POA: Insufficient documentation

## 2010-12-01 DIAGNOSIS — R109 Unspecified abdominal pain: Secondary | ICD-10-CM | POA: Insufficient documentation

## 2010-12-01 DIAGNOSIS — G8929 Other chronic pain: Secondary | ICD-10-CM | POA: Insufficient documentation

## 2010-12-02 ENCOUNTER — Emergency Department (HOSPITAL_COMMUNITY): Payer: Medicare Other

## 2010-12-02 LAB — URINALYSIS, ROUTINE W REFLEX MICROSCOPIC
Bilirubin Urine: NEGATIVE
Glucose, UA: NEGATIVE mg/dL
Ketones, ur: NEGATIVE mg/dL
Leukocytes, UA: NEGATIVE
Nitrite: NEGATIVE
Protein, ur: NEGATIVE mg/dL

## 2010-12-02 LAB — BASIC METABOLIC PANEL
BUN: 18 mg/dL (ref 6–23)
Calcium: 9.6 mg/dL (ref 8.4–10.5)
Chloride: 98 mEq/L (ref 96–112)
Creatinine, Ser: 0.59 mg/dL (ref 0.50–1.10)
GFR calc Af Amer: >90 mL/min (ref >90)
GFR calc non Af Amer: >90 mL/min (ref >90)

## 2010-12-02 LAB — DIFFERENTIAL
Eosinophils Absolute: 0.1 10*3/uL (ref 0.0–0.7)
Eosinophils Relative: 1 % (ref 0–5)
Lymphs Abs: 2.8 10*3/uL (ref 0.7–4.0)
Monocytes Absolute: 0.8 10*3/uL (ref 0.1–1.0)

## 2010-12-02 LAB — CBC
HCT: 38.7 % (ref 36.0–46.0)
MCHC: 32.3 g/dL (ref 30.0–36.0)
MCV: 93.5 fL (ref 78.0–100.0)
Platelets: 229 10*3/uL (ref 150–400)
RDW: 13.6 % (ref 11.5–15.5)
WBC: 12.9 10*3/uL — ABNORMAL HIGH (ref 4.0–10.5)

## 2011-01-01 ENCOUNTER — Other Ambulatory Visit: Payer: Self-pay | Admitting: Neurosurgery

## 2011-01-01 DIAGNOSIS — M549 Dorsalgia, unspecified: Secondary | ICD-10-CM

## 2011-01-06 ENCOUNTER — Other Ambulatory Visit: Payer: Medicare Other

## 2011-01-07 ENCOUNTER — Inpatient Hospital Stay
Admission: RE | Admit: 2011-01-07 | Discharge: 2011-01-07 | Payer: Medicare Other | Source: Ambulatory Visit | Attending: Neurosurgery | Admitting: Neurosurgery

## 2011-01-07 NOTE — Patient Instructions (Signed)

## 2011-03-05 ENCOUNTER — Encounter (HOSPITAL_COMMUNITY): Payer: Self-pay | Admitting: *Deleted

## 2011-03-05 ENCOUNTER — Emergency Department (HOSPITAL_COMMUNITY)
Admission: EM | Admit: 2011-03-05 | Discharge: 2011-03-06 | Disposition: A | Discharge status: Other Acute Inpatient Hospital | Payer: Medicare Other | Attending: Internal Medicine | Admitting: Internal Medicine

## 2011-03-05 DIAGNOSIS — E039 Hypothyroidism, unspecified: Secondary | ICD-10-CM | POA: Insufficient documentation

## 2011-03-05 DIAGNOSIS — F17200 Nicotine dependence, unspecified, uncomplicated: Secondary | ICD-10-CM | POA: Insufficient documentation

## 2011-03-05 DIAGNOSIS — E119 Type 2 diabetes mellitus without complications: Secondary | ICD-10-CM | POA: Insufficient documentation

## 2011-03-05 DIAGNOSIS — F312 Bipolar disorder, current episode manic severe with psychotic features: Secondary | ICD-10-CM | POA: Insufficient documentation

## 2011-03-05 DIAGNOSIS — E785 Hyperlipidemia, unspecified: Secondary | ICD-10-CM | POA: Insufficient documentation

## 2011-03-05 DIAGNOSIS — G4730 Sleep apnea, unspecified: Secondary | ICD-10-CM | POA: Insufficient documentation

## 2011-03-05 DIAGNOSIS — F309 Manic episode, unspecified: Secondary | ICD-10-CM

## 2011-03-05 HISTORY — DX: Hypothyroidism, unspecified: E03.9

## 2011-03-05 HISTORY — DX: Serous retinal detachment, unspecified eye: H33.20

## 2011-03-05 HISTORY — DX: Hyperlipidemia, unspecified: E78.5

## 2011-03-05 HISTORY — DX: Bipolar disorder, unspecified: F31.9

## 2011-03-05 LAB — COMPREHENSIVE METABOLIC PANEL
ALT: 54 U/L — ABNORMAL HIGH (ref 0–35)
AST: 66 U/L — ABNORMAL HIGH (ref 0–37)
Albumin: 4.5 g/dL (ref 3.5–5.2)
Alkaline Phosphatase: 82 U/L (ref 39–117)
BUN: 14 mg/dL (ref 6–23)
Chloride: 100 mEq/L (ref 96–112)
Potassium: 3.9 mEq/L (ref 3.5–5.1)
Sodium: 138 mEq/L (ref 135–145)
Total Bilirubin: 0.4 mg/dL (ref 0.3–1.2)
Total Protein: 8.1 g/dL (ref 6.0–8.3)

## 2011-03-05 LAB — CBC
HCT: 40.2 % (ref 36.0–46.0)
MCHC: 33.6 g/dL (ref 30.0–36.0)
Platelets: 286 10*3/uL (ref 150–400)
RDW: 14.1 % (ref 11.5–15.5)
WBC: 11.9 10*3/uL — ABNORMAL HIGH (ref 4.0–10.5)

## 2011-03-05 MED ORDER — LORAZEPAM 1 MG PO TABS
1.0000 mg | ORAL_TABLET | Freq: Three times a day (TID) | ORAL | Status: DC | PRN
  Administered 2011-03-05 – 2011-03-06 (×3): 1 mg via ORAL
  Filled 2011-03-05 (×3): qty 1

## 2011-03-05 MED ORDER — ZIPRASIDONE MESYLATE 20 MG IM SOLR
INTRAMUSCULAR | Status: AC
  Administered 2011-03-05: 20 mg via INTRAOCULAR
  Filled 2011-03-05: qty 20

## 2011-03-05 MED ORDER — ZIPRASIDONE MESYLATE 20 MG IM SOLR
20.0000 mg | Freq: Once | INTRAMUSCULAR | Status: AC
  Administered 2011-03-05: 20 mg via INTRAOCULAR

## 2011-03-05 NOTE — ED Notes (Signed)
Pt allowed administration of IM geodon 20 mg

## 2011-03-05 NOTE — BH Assessment (Signed)
Assessment Note   Kelsey Hanna is an 61 y.o. female. Presented to [facility] with her community ACT worker. Pt is manic and states she has not slept in about 4 days. Pt has been compliant with medications, but has grown more manic over the past week. Pt is able to answer some questions, but is tangential and has a flight of ideas. Pt reports that "the people in my community gossip and are two-faced, it's a [place] place" and then immediately began discussing how she is in the band. Pt reports no changes in her appetite. Denies SI or HI. Slight paranoia about others living in her assisted living. Pt does state she is not able to concentrate and is having difficulties focusing. Pt states she is also legally blind. Was able to perform ADLs independently with a few verbal directions. Pt not fully oriented, as she continued to ask to go to her room while being assessed in her room. Pt was cooperative with assessment.  Axis I: Bipolar, Manic Axis II: Deferred Axis III:  Past Medical History  Diagnosis Date  . Bipolar 1 disorder   . Hypothyroid   . Dyslipidemia   . Retinal detachment   . Sleep apnea   . Diabetes mellitus    Axis IV: other psychosocial or environmental problems Axis V: 21-30 behavior considerably influenced by delusions or hallucinations OR serious impairment in judgment, communication OR inability to function in almost all areas  Past Medical History:  Past Medical History  Diagnosis Date  . Bipolar 1 disorder   . Hypothyroid   . Dyslipidemia   . Retinal detachment   . Sleep apnea   . Diabetes mellitus     Past Surgical History  Procedure Date  . Abdominal hysterectomy   . Total abdominal hysterectomy w/ bilateral salpingoophorectomy     Family History: History reviewed. No pertinent family history.  Social History:  reports that she has been smoking Cigarettes.  She does not have any smokeless tobacco history on file. She reports that she drinks alcohol. She reports that  she does not use illicit drugs.  Additional Social History:  Alcohol / Drug Use Pain Medications: N/A Prescriptions: See PTA listing Over the Counter: n/a History of alcohol / drug use?: No history of alcohol / drug abuse Allergies:  Allergies  Allergen Reactions  . Lithium     Home Medications:  Medications Prior to Admission  Medication Dose Route Frequency Provider Last Rate Last Dose  . LORazepam (ATIVAN) tablet 1 mg  1 mg Oral Q8H PRN Suzi Roots, MD   1 mg at 03/05/11 1615   No current outpatient prescriptions on file as of 03/05/2011.    OB/GYN Status:  No LMP recorded. Patient has had a hysterectomy.  General Assessment Data Location of Assessment: [facility] Living Arrangements: Assisted Living Facility Can pt return to current living arrangement?: Yes Admission Status: Voluntary Is patient capable of signing voluntary admission?: Yes Transfer from: Other (Comment) (ALF) Referral Source: Other Arts development officer)  Education Status Is patient currently in school?: No  Risk to self Suicidal Ideation: No Suicidal Intent: No Is patient at risk for suicide?: No Suicidal Plan?: No Access to Means: No What has been your use of drugs/alcohol within the last 12 months?: pt denies Previous Attempts/Gestures: No How many times?: 0  Other Self Harm Risks: n/a Triggers for Past Attempts: None known Intentional Self Injurious Behavior: None Family Suicide History: Unknown Recent stressful life event(s): Other (Comment) (Manic - Rx changes) Persecutory  voices/beliefs?: Yes (Believes others in her living community talk about her) Depression: Yes Depression Symptoms: Insomnia Substance abuse history and/or treatment for substance abuse?: No Suicide prevention information given to non-admitted patients: Not applicable  Risk to Others Homicidal Ideation: No Thoughts of Harm to Others: No Current Homicidal Intent: No Current Homicidal Plan: No Access to Homicidal  Means: No Identified Victim: n/a History of harm to others?: No Assessment of Violence: None Noted Does patient have access to weapons?: No Criminal Charges Pending?: No Does patient have a court date: No  Psychosis Hallucinations: None noted Delusions: Persecutory  Mental Status Report Appear/Hygiene: Disheveled Eye Contact: Fair Motor Activity: Freedom of movement Speech: Tangential Level of Consciousness: Restless Mood: Irritable;Anxious;Labile Affect: Irritable;Labile Anxiety Level: Minimal Thought Processes: Tangential;Flight of Ideas Judgement: Impaired Orientation: Person (not fully oriented - kept asking to go to her room while in) Obsessive Compulsive Thoughts/Behaviors: None  Cognitive Functioning Concentration: Decreased Memory: Recent Impaired;Remote Impaired IQ: Average Insight: Poor Impulse Control: Poor Appetite: Good Weight Loss: 0  Weight Gain: 0  Sleep: Decreased (Not slept in 4 days) Total Hours of Sleep: 0  Vegetative Symptoms: None  Prior Inpatient Therapy Prior Inpatient Therapy: No (Unknown)  Prior Outpatient Therapy Prior Outpatient Therapy: Yes Prior Therapy Dates: Current Prior Therapy Facilty/Provider(s): Community [facility] - not sure of name Reason for Treatment: Bipolar  ADL Screening (condition at time of admission) Patient's cognitive ability adequate to safely complete daily activities?: Yes (Pt states she is legally blind) Patient able to express need for assistance with ADLs?: Yes Independently performs ADLs?: Yes (Pt states is legally blind - needs some assistance but mostl) Communication: Independent Dressing (OT): Independent Grooming: Independent Feeding: Independent Bathing: Independent Toileting: Independent In/Out Bed: Independent Walks in Home: Independent       Abuse/Neglect Assessment (Assessment to be complete while patient is alone) Physical Abuse: Denies Verbal Abuse: Denies Sexual Abuse: Denies Exploitation  of patient/patient's resources: Denies Self-Neglect: Denies Values / Beliefs Cultural Requests During Hospitalization: None Spiritual Requests During Hospitalization: None   Advance Directives (For Healthcare) Advance Directive: Patient does not have advance directive;Patient would not like information Pre-existing out of facility DNR order (yellow form or pink MOST form): No    Additional Information 1:1 In Past 12 Months?: No CIRT Risk: No Elopement Risk: No Does patient have medical clearance?: Yes     Disposition:  Disposition Disposition of Patient: Referred to [facility]) Patient referred to: Other (Comment) ([facility], [facility], [facility])  On Site Evaluation by:   Reviewed with Physician:     Romeo Apple 03/05/2011 6:34 PM

## 2011-03-05 NOTE — ED Notes (Addendum)
Pts home health Act Team: Kelsey Hanna- cell [phone] 24hr crisis [phone]
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
## 2011-03-05 NOTE — ED Notes (Signed)
Faxed assessment to the following hospitals for IPT review: [facility] [place] [facility]

## 2011-03-05 NOTE — ED Notes (Signed)
pts behavior starting to escalate, pt loud, disruptive, wandering, agitated, MD called Geodon 20 mg IM ordered

## 2011-03-05 NOTE — ED Notes (Signed)
Coming up to nursing station, asked to go back to her room, got upset and screamed she had hit her foot and it was "bleeding". No blood visible on the floor, asked to put her socks on to protect her feet, skin is ok on feet, no open areas, no bleeding noted

## 2011-03-05 NOTE — ED Provider Notes (Addendum)
History     CSN: [id number]  Arrival date & time 03/05/11  1359   First MD Initiated Contact with Patient 03/05/11 1411      Chief Complaint  Patient presents with  . Manic Behavior    Pts home health psych team member reports that pt has been acting manic. reports not sleeping at night and pressured sleep.     (Consider location/radiation/quality/duration/timing/severity/associated sxs/prior treatment) The history is provided by the patient.  pt w hx bipolar disorder with 'manic behavior' per beh health caregiver. Hx same x months/years. Symptoms worse in past week. Rapidly moves from one thought process to next. Says cant stay focused. Is very difficult historian. States is compliant w meds. Denies recent physical illness or other c/o. Is unaware of exacerbating or alleviating symptoms. Denies suicidal thoughts.   Past Medical History  Diagnosis Date  . Bipolar 1 disorder   . Hypothyroid   . Dyslipidemia   . Retinal detachment   . Sleep apnea   . Diabetes mellitus     Past Surgical History  Procedure Date  . Abdominal hysterectomy   . Total abdominal hysterectomy w/ bilateral salpingoophorectomy     History reviewed. No pertinent family history.  History  Substance Use Topics  . Smoking status: Current Everyday Smoker    Types: Cigarettes  . Smokeless tobacco: Not on file  . Alcohol Use: Yes     socially    OB History    Grav Para Term Preterm Abortions TAB SAB Ect Mult Living                  Review of Systems  Constitutional: Negative for fever and chills.  HENT: Negative for neck pain.   Eyes: Negative for redness.  Respiratory: Negative for shortness of breath.   Cardiovascular: Negative for chest pain.  Gastrointestinal: Negative for abdominal pain.  Genitourinary: Negative for flank pain.  Musculoskeletal: Negative for back pain.  Skin: Negative for rash.  Neurological: Negative for headaches.  Hematological: Does not bruise/bleed easily.    Psychiatric/Behavioral: Positive for agitation.    Allergies  Lithium  Home Medications  No current outpatient prescriptions on file.  BP 155/79  Pulse 95  Temp(Src) 97.4 F (36.3 C) (Oral)  Resp 20  SpO2 95%  Physical Exam  Nursing note and vitals reviewed. Constitutional: She appears well-developed and well-nourished. No distress.  HENT:  Head: Atraumatic.  Eyes: Conjunctivae are normal. Pupils are equal, round, and reactive to light. No scleral icterus.  Neck: Neck supple. No tracheal deviation present.  Cardiovascular: Normal rate, regular rhythm, normal heart sounds and intact distal pulses.   Pulmonary/Chest: Effort normal and breath sounds normal. No respiratory distress.  Abdominal: Soft. Normal appearance. She exhibits no distension. There is no tenderness.  Musculoskeletal: She exhibits no tenderness.  Neurological: She is alert.       Alert, fast, pressure speech, motor intact bil.   Skin: Skin is warm and dry. No rash noted.  Psychiatric:       Pt rapidly moves from one unconnected thought process to next. Pressure speech. Delusional.     ED Course  Procedures (including critical care time)   Labs Reviewed  CBC  COMPREHENSIVE METABOLIC PANEL  URINE RAPID DRUG SCREEN (HOSP PERFORMED)  ETHANOL  URINALYSIS, ROUTINE W REFLEX MICROSCOPIC  VALPROIC ACID LEVEL    Results for orders placed during the hospital encounter of 03/05/11  CBC      Component Value Range   WBC 11.9 (*) 4.0 -  10.5 (K/uL)   RBC 4.53  3.87 - 5.11 (MIL/uL)   Hemoglobin 13.5  12.0 - 15.0 (g/dL)   HCT 16.1  09.6 - 04.5 (%)   MCV 88.7  78.0 - 100.0 (fL)   MCH 29.8  26.0 - 34.0 (pg)   MCHC 33.6  30.0 - 36.0 (g/dL)   RDW 40.9  81.1 - 91.4 (%)   Platelets 286  150 - 400 (K/uL)  COMPREHENSIVE METABOLIC PANEL      Component Value Range   Sodium 138  135 - 145 (mEq/L)   Potassium 3.9  3.5 - 5.1 (mEq/L)   Chloride 100  96 - 112 (mEq/L)   CO2 23  19 - 32 (mEq/L)   Glucose, Bld 107 (*)  70 - 99 (mg/dL)   BUN 14  6 - 23 (mg/dL)   Creatinine, Ser 7.82  0.50 - 1.10 (mg/dL)   Calcium 95.6  8.4 - 10.5 (mg/dL)   Total Protein 8.1  6.0 - 8.3 (g/dL)   Albumin 4.5  3.5 - 5.2 (g/dL)   AST 66 (*) 0 - 37 (U/L)   ALT 54 (*) 0 - 35 (U/L)   Alkaline Phosphatase 82  39 - 117 (U/L)   Total Bilirubin 0.4  0.3 - 1.2 (mg/dL)   GFR calc non Af Amer >90  >90 (mL/min)   GFR calc Af Amer >90  >90 (mL/min)  ETHANOL      Component Value Range   Alcohol, Ethyl (B) <11  0 - 11 (mg/dL)        MDM  Labs. beh health team called.   Additional hx from pts act team caregiver.   Reviewed prior charts.   Mild elev ast/alt. Ndenies tylenol use. No od. Mild elev lft prior. Pt on lipitor, will hold.   Temp psych orders.   Signed out to Dr Ranae Palms that pt likely will need psych admit/placement. Also that lfts mildly elev, hold lipitor, labs back x depakote pending - also discussed need to f/u pending labs, and w telepsych and beh health eval. .  Will order telepsych consult.        Suzi Roots, MD 03/05/11 1549  Suzi Roots, MD 03/05/11 1551  [facility] considering accepting patient and is requesting a chest x-ray and EKG which were ordered. EKG reviewed as below   Date: 03/06/2011  Rate: 74  Rhythm: normal sinus rhythm  QRS Axis: normal  Intervals: normal  ST/T Wave abnormalities: nonspecific ST changes  Conduction Disutrbances:nonspecific intraventricular conduction delay  Narrative Interpretation:   Old EKG Reviewed: No significant changes    Sunnie Nielsen, MD 03/06/11 [id number]

## 2011-03-05 NOTE — ED Notes (Signed)
Care assumed

## 2011-03-05 NOTE — ED Notes (Signed)
Found in another room on the bed, assisted patient back to her room, put to bed and covered up for the night, sleeping soundly and snoring at times

## 2011-03-05 NOTE — ED Notes (Addendum)
Pt's O2 Sat 89% , finger probe reattached to obtain another assessment and pt asked to calm down and remain quiet, O2 sat went up to 95% however dropped back down to 88%. Dr.Yelverton informed who states d/t pt being talkative, restless, and all over the place he is not concerned at this time and that geodon 20 mg IM admin should help calm pt down. Writer continuing to monitor.

## 2011-03-05 NOTE — ED Notes (Signed)
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 Thanks nursing for the med and started working w/her remote control.
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
## 2011-03-06 ENCOUNTER — Other Ambulatory Visit: Payer: Self-pay

## 2011-03-06 ENCOUNTER — Emergency Department (HOSPITAL_COMMUNITY): Payer: Medicare Other

## 2011-03-06 MED ORDER — DIVALPROEX SODIUM ER 500 MG PO TB24
750.0000 mg | ORAL_TABLET | Freq: Every day | ORAL | Status: DC
  Filled 2011-03-06: qty 1

## 2011-03-06 MED ORDER — FUROSEMIDE 20 MG PO TABS
20.0000 mg | ORAL_TABLET | Freq: Every day | ORAL | Status: DC
  Administered 2011-03-06: 20 mg via ORAL
  Filled 2011-03-06: qty 1

## 2011-03-06 MED ORDER — BENZTROPINE MESYLATE 1 MG PO TABS
0.5000 mg | ORAL_TABLET | Freq: Every day | ORAL | Status: DC
  Administered 2011-03-06: 0.5 mg via ORAL
  Filled 2011-03-06: qty 1

## 2011-03-06 MED ORDER — LEVOTHYROXINE SODIUM 150 MCG PO TABS
150.0000 ug | ORAL_TABLET | Freq: Every day | ORAL | Status: DC
  Administered 2011-03-06: 150 ug via ORAL
  Filled 2011-03-06: qty 1

## 2011-03-06 MED ORDER — SERTRALINE HCL 50 MG PO TABS
100.0000 mg | ORAL_TABLET | ORAL | Status: DC
  Administered 2011-03-06: 100 mg via ORAL
  Filled 2011-03-06 (×2): qty 1

## 2011-03-06 MED ORDER — METFORMIN HCL 500 MG PO TABS
500.0000 mg | ORAL_TABLET | Freq: Two times a day (BID) | ORAL | Status: DC
  Administered 2011-03-06: 500 mg via ORAL
  Filled 2011-03-06 (×2): qty 1

## 2011-03-06 MED ORDER — ARIPIPRAZOLE 2 MG PO TABS
2.0000 mg | ORAL_TABLET | Freq: Every day | ORAL | Status: DC
  Administered 2011-03-06: 2 mg via ORAL
  Filled 2011-03-06: qty 1

## 2011-03-06 NOTE — ED Notes (Signed)
Pt remains restless, disorganized, confused, and needs constant redirection back to room, using purell and wiping all over her body including her mouth, constant structure required.

## 2011-03-06 NOTE — ED Notes (Signed)
Breakfast tray given. °

## 2011-03-06 NOTE — Discharge Planning (Signed)
Patient has been accepted to [facility], Dr. Betti Cruz. Patient is being made IVC due manic behaviors and lack of capacity to consent for treatment in that state.  EDP and patient's nurse notified.  Pt's nurse to call report to [phone]. Patient going to [employer].  Ileene Hutchinson , MSW, LCSWA 03/06/2011 9:50 AM [phone]

## 2011-03-06 NOTE — ED Notes (Signed)
Pt daughter called and pt came to window to talk to same.

## 2011-03-06 NOTE — ED Notes (Signed)
Per, [place], Pt denied at [facility] due to facility acuity.

## 2011-03-06 NOTE — ED Provider Notes (Signed)
Pt accepted to [facility].  Will transfer stable.   Laray Anger, DO 03/06/11 1019

## 2011-03-06 NOTE — ED Notes (Signed)
CBG 193. 

## 2011-04-16 ENCOUNTER — Other Ambulatory Visit: Payer: Self-pay | Admitting: Orthopedic Surgery

## 2011-04-16 DIAGNOSIS — M25572 Pain in left ankle and joints of left foot: Secondary | ICD-10-CM

## 2011-04-19 ENCOUNTER — Ambulatory Visit
Admission: RE | Admit: 2011-04-19 | Discharge: 2011-04-19 | Disposition: A | Discharge status: Home / Self Care | Payer: Medicare Other | Source: Ambulatory Visit | Attending: Orthopedic Surgery | Admitting: Orthopedic Surgery

## 2011-04-19 DIAGNOSIS — M25572 Pain in left ankle and joints of left foot: Secondary | ICD-10-CM

## 2011-05-28 ENCOUNTER — Emergency Department (HOSPITAL_COMMUNITY)
Admission: EM | Admit: 2011-05-28 | Discharge: 2011-05-28 | Disposition: A | Discharge status: Home / Self Care | Payer: Medicare Other | Attending: Emergency Medicine | Admitting: Emergency Medicine

## 2011-05-28 ENCOUNTER — Encounter (HOSPITAL_COMMUNITY): Payer: Self-pay

## 2011-05-28 ENCOUNTER — Emergency Department (HOSPITAL_COMMUNITY): Payer: Medicare Other

## 2011-05-28 DIAGNOSIS — E039 Hypothyroidism, unspecified: Secondary | ICD-10-CM | POA: Insufficient documentation

## 2011-05-28 DIAGNOSIS — M62838 Other muscle spasm: Secondary | ICD-10-CM | POA: Insufficient documentation

## 2011-05-28 DIAGNOSIS — IMO0002 Reserved for concepts with insufficient information to code with codable children: Secondary | ICD-10-CM | POA: Insufficient documentation

## 2011-05-28 DIAGNOSIS — T148XXA Other injury of unspecified body region, initial encounter: Secondary | ICD-10-CM

## 2011-05-28 DIAGNOSIS — M5416 Radiculopathy, lumbar region: Secondary | ICD-10-CM

## 2011-05-28 DIAGNOSIS — E119 Type 2 diabetes mellitus without complications: Secondary | ICD-10-CM | POA: Insufficient documentation

## 2011-05-28 DIAGNOSIS — Z79899 Other long term (current) drug therapy: Secondary | ICD-10-CM | POA: Insufficient documentation

## 2011-05-28 DIAGNOSIS — F319 Bipolar disorder, unspecified: Secondary | ICD-10-CM | POA: Insufficient documentation

## 2011-05-28 LAB — CBC
Hemoglobin: 13.8 g/dL (ref 12.0–15.0)
MCV: 89.8 fL (ref 78.0–100.0)
Platelets: 209 10*3/uL (ref 150–400)
RBC: 4.61 MIL/uL (ref 3.87–5.11)
WBC: 8.4 10*3/uL (ref 4.0–10.5)

## 2011-05-28 LAB — BASIC METABOLIC PANEL
CO2: 25 mEq/L (ref 19–32)
Chloride: 100 mEq/L (ref 96–112)
GFR calc non Af Amer: >90 mL/min (ref >90)
Glucose, Bld: 105 mg/dL — ABNORMAL HIGH (ref 70–99)
Potassium: 3.8 mEq/L (ref 3.5–5.1)
Sodium: 137 mEq/L (ref 135–145)

## 2011-05-28 LAB — DIFFERENTIAL
Eosinophils Relative: 0 % (ref 0–5)
Lymphocytes Relative: 20 % (ref 12–46)
Lymphs Abs: 1.7 10*3/uL (ref 0.7–4.0)

## 2011-05-28 MED ORDER — CYCLOBENZAPRINE HCL 10 MG PO TABS
5.0000 mg | ORAL_TABLET | Freq: Once | ORAL | Status: DC
  Filled 2011-05-28: qty 1

## 2011-05-28 MED ORDER — CYCLOBENZAPRINE HCL 10 MG PO TABS: 10.0000 mg | ORAL_TABLET | Freq: Two times a day (BID) | ORAL | Status: AC | PRN

## 2011-05-28 MED ORDER — HYDROCODONE-ACETAMINOPHEN 5-325 MG PO TABS
1.0000 | ORAL_TABLET | Freq: Once | ORAL | Status: AC
  Administered 2011-05-28: 1 via ORAL
  Filled 2011-05-28: qty 1

## 2011-05-28 MED ORDER — DIAZEPAM 5 MG PO TABS
5.0000 mg | ORAL_TABLET | Freq: Once | ORAL | Status: AC
  Administered 2011-05-28: 5 mg via ORAL
  Filled 2011-05-28: qty 1

## 2011-05-28 MED ORDER — OXYCODONE-ACETAMINOPHEN 5-325 MG PO TABS: 2.0000 | ORAL_TABLET | ORAL | Status: AC | PRN

## 2011-05-28 MED ORDER — OXYCODONE-ACETAMINOPHEN 5-325 MG PO TABS
1.0000 | ORAL_TABLET | Freq: Once | ORAL | Status: AC
  Administered 2011-05-28: 1 via ORAL
  Filled 2011-05-28: qty 1

## 2011-05-28 NOTE — ED Notes (Signed)
Patient transported to X-ray 

## 2011-05-28 NOTE — ED Notes (Signed)
Fell from standing position 2 days ago after "knee gave way". Has had constant pain and difficulty ambulating since fall.

## 2011-05-28 NOTE — Discharge Instructions (Signed)
Lumbosacral Radiculopathy Lumbosacral radiculopathy is a pinched nerve or nerves in the low back (lumbosacral area). When this happens you may have weakness in your legs and may not be able to stand on your toes. You may have pain going down into your legs. There may be difficulties with walking normally. There are many causes of this problem. Sometimes this may happen from an injury, or simply from arthritis or boney problems. It may also be caused by other illnesses such as diabetes. If there is no improvement after treatment, further studies may be done to find the exact cause. DIAGNOSIS  X-rays may be needed if the problems become long standing. Electromyograms may be done. This study is one in which the working of nerves and muscles is studied. HOME CARE INSTRUCTIONS   Applications of ice packs may be helpful. Ice can be used in a plastic bag with a towel around it to prevent frostbite to skin. This may be used every 2 hours for 20 to 30 minutes, or as needed, while awake, or as directed by your caregiver.   Only take over-the-counter or prescription medicines for pain, discomfort, or fever as directed by your caregiver.   If physical therapy was prescribed, follow your caregiver's directions.  SEEK IMMEDIATE MEDICAL CARE IF:   You have pain not controlled with medications.   You seem to be getting worse rather than better.   You develop increasing weakness in your legs.   You develop loss of bowel or bladder control.   You have difficulty with walking or balance, or develop clumsiness in the use of your legs.   You have a fever.  MAKE SURE YOU:   Understand these instructions.   Will watch your condition.   Will get help right away if you are not doing well or get worse.  Document Released: 02/03/2005 Document Revised: 01/23/2011 Document Reviewed: 09/24/2007 [facility] Patient Information 2012 [place], [place].Muscle Strain A muscle strain (pulled muscle) happens when a muscle is  over-stretched. Recovery usually takes 5 to 6 weeks.  HOME CARE   Put ice on the injured area.   Put ice in a plastic bag.   Place a towel between your skin and the bag.   Leave the ice on for 15 to 20 minutes at a time, every hour for the first 2 days.   Do not use the muscle for several days or until your doctor says you can. Do not use the muscle if you have pain.   Wrap the injured area with an elastic bandage for comfort. Do not put it on too tightly.   Only take medicine as told by your doctor.   Warm up before exercise. This helps prevent muscle strains.  GET HELP RIGHT AWAY IF:  There is increased pain or puffiness (swelling) in the affected area. MAKE SURE YOU:   Understand these instructions.   Will watch your condition.   Will get help right away if you are not doing well or get worse.  Document Released: 11/13/2007 Document Revised: 01/23/2011 Document Reviewed: 11/13/2007 [facility] Patient Information 2012 [place], [place].  Use the medicines prescribed for pain and muscle spasm.  Use caution as these medicines may make you drowsy.  Please call Dr. Jeral Fruit for recheck of your symptoms if not improving over the next several days.

## 2011-05-28 NOTE — ED Notes (Signed)
[id number]> Expected date:<BR> Expected time:<BR> Means of arrival:<BR> Comments:<BR> Ems/ hip pain fall for a few days ago 61

## 2011-05-28 NOTE — ED Provider Notes (Signed)
History     CSN: [id number]  Arrival date & time 05/28/11  4098   First MD Initiated Contact with Patient 05/28/11 1012      Chief Complaint  Patient presents with  . Fall    (Consider location/radiation/quality/duration/timing/severity/associated sxs/prior treatment) HPI Comments: Patient describes a 2 day history of muscle spasm that starts in her left hip and radiates to her left upper foot.  Was attempting to walk 2 days ago and her left knee collapsed and she sustained a controlled "gentle" fall,  Per patient.  She denies any new injury sustained in the fall.  She has persistent weakness in her left leg,  With shaking tremor when she attempts to weight bear.  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  Patient is a 61 y.o. female presenting with fall. The history is provided by the patient.  Fall The accident occurred 2 days ago. She landed on carpet. The pain is at a severity of 10/10. The pain is severe. Patient ambulatory at scene: Patient has been ambulatory after the event, but this has been difficult for her. Pertinent negatives include no fever, no numbness, no abdominal pain, no bowel incontinence, no nausea, no vomiting, no hematuria, no headaches, no loss of consciousness and no tingling. Associated symptoms comments: She has had no problems with urinary or bowel retention or incontinence.. The symptoms are aggravated by activity. She has tried nothing for the symptoms.    Past Medical History  Diagnosis Date  . Bipolar 1 disorder   . Hypothyroid   . Dyslipidemia   . Retinal detachment   . Sleep apnea   . Diabetes mellitus     Past Surgical History  Procedure Date  . Abdominal hysterectomy   . Total abdominal hysterectomy w/ bilateral salpingoophorectomy     No family history on file.  History  Substance Use Topics  . Smoking status: Current Everyday Smoker    Types: Cigarettes  . Smokeless tobacco: Not on file  . Alcohol Use: Yes     socially     OB History    Grav Para Term Preterm Abortions TAB SAB Ect Mult Living                  Review of Systems  Constitutional: Negative for fever.  HENT: Negative for congestion, sore throat and neck pain.   Eyes: Negative.   Respiratory: Negative for chest tightness and shortness of breath.   Cardiovascular: Negative for chest pain.  Gastrointestinal: Negative for nausea, vomiting, abdominal pain and bowel incontinence.  Genitourinary: Negative.  Negative for hematuria.  Musculoskeletal: Positive for myalgias, back pain, arthralgias and gait problem. Negative for joint swelling.  Skin: Negative.  Negative for rash and wound.  Neurological: Negative for dizziness, tingling, loss of consciousness, weakness, light-headedness, numbness and headaches.  Hematological: Negative.   Psychiatric/Behavioral: Negative.     Allergies  Lithium  Home Medications   Current Outpatient Rx  Name Route Sig Dispense Refill  . ARIPIPRAZOLE 2 MG PO TABS Oral Take 2 mg by mouth daily.    . ATORVASTATIN CALCIUM 40 MG PO TABS Oral Take 40 mg by mouth daily.    Marland Kitchen BENZTROPINE MESYLATE 0.5 MG PO TABS Oral Take 0.5 mg by mouth daily.    Marland Kitchen DIVALPROEX SODIUM ER 250 MG PO TB24 Oral Take 750 mg by mouth 2 (two) times daily.     Marland Kitchen ESZOPICLONE 1 MG PO TABS Oral Take 3 mg by mouth at bedtime.  Take immediately before bedtime    . FUROSEMIDE 20 MG PO TABS Oral Take 20 mg by mouth daily.    Marland Kitchen LEVOTHYROXINE SODIUM 150 MCG PO TABS Oral Take 150 mcg by mouth daily.    Marland Kitchen METFORMIN HCL 500 MG PO TABS Oral Take 500 mg by mouth 2 (two) times daily with a meal.    . NYSTATIN 100000 UNIT/GM EX CREA Topical Apply 1 application topically 3 (three) times daily as needed. For groin area    . SERTRALINE HCL 100 MG PO TABS Oral Take 100 mg by mouth every morning.    . CYCLOBENZAPRINE HCL 10 MG PO TABS Oral Take 1 tablet (10 mg total) by mouth 2 (two) times daily as needed for muscle spasms. 20 tablet 0  . OXYCODONE-ACETAMINOPHEN  5-325 MG PO TABS Oral Take 2 tablets by mouth every 4 (four) hours as needed for pain. 15 tablet 0    BP 126/67  Pulse 96  Temp(Src) 98.3 F (36.8 C) (Oral)  Resp 20  SpO2 95%  Physical Exam  Nursing note and vitals reviewed. Constitutional: She is oriented to person, place, and time. She appears well-developed and well-nourished.  HENT:  Head: Normocephalic and atraumatic.  Eyes: Conjunctivae are normal.  Neck: Normal range of motion. Neck supple.  Cardiovascular: Normal rate, regular rhythm, normal heart sounds and intact distal pulses.        Pedal pulses normal.  Pulmonary/Chest: Effort normal and breath sounds normal. She has no wheezes.  Abdominal: Soft. Bowel sounds are normal. She exhibits no distension and no mass. There is no tenderness.  Musculoskeletal: Normal range of motion. She exhibits no edema.       Lumbar back: She exhibits tenderness. She exhibits no swelling, no edema and no spasm.  Neurological: She is alert and oriented to person, place, and time. She has normal strength. She displays no atrophy and no tremor. No sensory deficit. Gait normal.  Reflex Scores:      Patellar reflexes are 1+ on the right side and 1+ on the left side.      Achilles reflexes are 1+ on the right side and 1+ on the left side.      No strength deficit noted in hip and knee flexor and extensor muscle groups.  Ankle flexion and extension intact.  Skin: Skin is warm and dry.  Psychiatric: She has a normal mood and affect.    ED Course  Procedures (including critical care time)  Labs Reviewed  BASIC METABOLIC PANEL - Abnormal; Notable for the following:    Glucose, Bld 105 (*)    All other components within normal limits  VALPROIC ACID LEVEL  CBC  DIFFERENTIAL   Dg Hip Complete Left  05/28/2011  *RADIOLOGY REPORT*  Clinical Data: Fall, left hip pain and the pain.  LEFT HIP - COMPLETE 2+ VIEW  Comparison: None.  Findings: Degenerative changes in the hips bilaterally. No acute bony  abnormality.  Specifically, no fracture, subluxation, or dislocation.  Soft tissues are intact.  IMPRESSION: No acute bony abnormality.  Degenerative changes in the hips.  Original Report Authenticated By: Cyndie Chime, M.D.   Dg Knee Complete 4 Views Left  05/28/2011  *RADIOLOGY REPORT*  Clinical Data: Pain, difficulty walking.  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 No acute bony abnormality.  Specifically, no fracture, subluxation,  or dislocation.  Soft tissues are intact. No joint effusion.  Joint spaces are maintained.  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  No acute findings.  Original Report Authenticated By: Cyndie Chime, M.D.     1. Lumbar radiculopathy   2. Muscle strain       MDM  Patient has received mild relief of pain with hydrocodone, reports Valium has not relieved the muscle spasm.  Reports she sees Dr. Jeral Fruit for her chronic back pain and Flexeril and Percocet has been helpful in the past.  We will give a dose of each of these medicines prior to discharge home.  Patient encouraged to followup with Dr. Jeral Fruit if her symptoms persist.    The patient appears reasonably screened and/or stabilized for discharge and I doubt any other medical condition or other [facility] requiring further screening, evaluation, or treatment in the ED at this time prior to discharge. No neuro deficit on exam or by history to suggest emergent or surgical presentation.          Candis Musa, PA 05/29/11 [phone]

## 2011-05-29 NOTE — ED Provider Notes (Signed)
Medical screening examination/treatment/procedure(s) were performed by non-physician practitioner and as supervising physician I was immediately available for consultation/collaboration.   Dannelle Rhymes, MD 05/29/11 1619 

## 2011-05-30 ENCOUNTER — Emergency Department (HOSPITAL_COMMUNITY)
Admission: EM | Admit: 2011-05-30 | Discharge: 2011-05-30 | Disposition: A | Discharge status: Home / Self Care | Payer: Medicare Other | Attending: Emergency Medicine | Admitting: Emergency Medicine

## 2011-05-30 ENCOUNTER — Encounter (HOSPITAL_COMMUNITY): Payer: Self-pay | Admitting: Emergency Medicine

## 2011-05-30 DIAGNOSIS — R42 Dizziness and giddiness: Secondary | ICD-10-CM | POA: Insufficient documentation

## 2011-05-30 DIAGNOSIS — M5432 Sciatica, left side: Secondary | ICD-10-CM

## 2011-05-30 DIAGNOSIS — M545 Low back pain, unspecified: Secondary | ICD-10-CM | POA: Insufficient documentation

## 2011-05-30 DIAGNOSIS — Z79899 Other long term (current) drug therapy: Secondary | ICD-10-CM | POA: Insufficient documentation

## 2011-05-30 DIAGNOSIS — F319 Bipolar disorder, unspecified: Secondary | ICD-10-CM | POA: Insufficient documentation

## 2011-05-30 DIAGNOSIS — E119 Type 2 diabetes mellitus without complications: Secondary | ICD-10-CM | POA: Insufficient documentation

## 2011-05-30 DIAGNOSIS — E785 Hyperlipidemia, unspecified: Secondary | ICD-10-CM | POA: Insufficient documentation

## 2011-05-30 DIAGNOSIS — E039 Hypothyroidism, unspecified: Secondary | ICD-10-CM | POA: Insufficient documentation

## 2011-05-30 DIAGNOSIS — M79609 Pain in unspecified limb: Secondary | ICD-10-CM | POA: Insufficient documentation

## 2011-05-30 DIAGNOSIS — F17200 Nicotine dependence, unspecified, uncomplicated: Secondary | ICD-10-CM | POA: Insufficient documentation

## 2011-05-30 DIAGNOSIS — M5430 Sciatica, unspecified side: Secondary | ICD-10-CM | POA: Insufficient documentation

## 2011-05-30 LAB — URINALYSIS, ROUTINE W REFLEX MICROSCOPIC
Glucose, UA: NEGATIVE mg/dL
Ketones, ur: 15 mg/dL — AB
Leukocytes, UA: NEGATIVE
pH: 6.5 (ref 5.0–8.0)

## 2011-05-30 LAB — POCT I-STAT, CHEM 8
BUN: 20 mg/dL (ref 6–23)
Chloride: 104 mEq/L (ref 96–112)
Sodium: 140 mEq/L (ref 135–145)

## 2011-05-30 MED ORDER — CYCLOBENZAPRINE HCL 10 MG PO TABS
5.0000 mg | ORAL_TABLET | Freq: Once | ORAL | Status: AC
  Administered 2011-05-30: 5 mg via ORAL
  Filled 2011-05-30: qty 1

## 2011-05-30 MED ORDER — CYCLOBENZAPRINE HCL 5 MG PO TABS: 5.0000 mg | ORAL_TABLET | Freq: Three times a day (TID) | ORAL | Status: AC | PRN

## 2011-05-30 MED ORDER — OXYCODONE-ACETAMINOPHEN 5-325 MG PO TABS
1.0000 | ORAL_TABLET | Freq: Once | ORAL | Status: AC
  Administered 2011-05-30: 1 via ORAL
  Filled 2011-05-30: qty 1

## 2011-05-30 MED ORDER — OXYCODONE-ACETAMINOPHEN 5-325 MG PO TABS: 1.0000 | ORAL_TABLET | Freq: Four times a day (QID) | ORAL | Status: AC | PRN

## 2011-05-30 MED ORDER — SODIUM CHLORIDE 0.9 % IV BOLUS (SEPSIS)
500.0000 mL | Freq: Once | INTRAVENOUS | Status: AC
  Administered 2011-05-30: 500 mL via INTRAVENOUS

## 2011-05-30 MED ORDER — OXYCODONE-ACETAMINOPHEN 5-325 MG PO TABS
2.0000 | ORAL_TABLET | Freq: Once | ORAL | Status: AC
  Administered 2011-05-30: 2 via ORAL
  Filled 2011-05-30: qty 2

## 2011-05-30 NOTE — Discharge Instructions (Signed)
Return to the ED with any concerns including weakness of leg, not able to urinate, loss of control of bowel or bladder, fever, fainting, chest pain, difficulty breathing, vomiting and not able to keep down liquids, or any other alarming symptoms

## 2011-05-30 NOTE — ED Notes (Signed)
Pt dtr called back, states will take 1-2 hours for her to arrive. Pt moved to hall G.

## 2011-05-30 NOTE — ED Notes (Signed)
JXB:[id number]<GN> Expected date:05/30/11<BR> Expected time:<BR> Means of arrival:<BR> Comments:<BR> EMS 51 GC - leg cramps/dizziness

## 2011-05-30 NOTE — ED Notes (Signed)
Per EMS, pt CNA took pt to MD for c/o L leg cramps x several days, upon transfer pt became dizzy and CNA called EMS. EMS reports CBG 109 and BP 150/100. Pt currently denies CP/SOB or dizziness. Pt states L leg pain is severe 8/10. During assess, staff sat pt up to assist with gown and pt became dizzy again.

## 2011-05-30 NOTE — ED Notes (Signed)
RN called pt dtr x 2, left message to return call. RN notified Emma at [facility] of pt d/c orders and of inability to reach pt dtr.

## 2011-05-30 NOTE — ED Provider Notes (Signed)
History     CSN: [id number]  Arrival date & time 05/30/11  1029   First MD Initiated Contact with Patient 05/30/11 1043      Chief Complaint  Patient presents with  . Leg Pain    (Consider location/radiation/quality/duration/timing/severity/associated sxs/prior treatment) HPI Pt presents with c/o pain in left leg- pt describes pain as a cramping feeling- starts in left lower back and radiates down to left lower leg.  She states that pain is briefly improved upon changing positions, worse with certain movements.  She was seen in the ED several days ago for same symptoms.  She was being taken to her PMD follow up appointment today by a CNA and was dizzy upon leaving the house.  Pt atttributes this to not eating yet today and feeling hungry.  She denies any weakness of legs, no urinary retention, no incontinence of bowel or bladder.  No fever.  No chest pain or shortness of breath, no fainting.  There are no other associated systemic symptoms, there are no other alleviating or modifying factors.   Past Medical History  Diagnosis Date  . Bipolar 1 disorder   . Hypothyroid   . Dyslipidemia   . Retinal detachment   . Sleep apnea   . Diabetes mellitus     Past Surgical History  Procedure Date  . Abdominal hysterectomy   . Total abdominal hysterectomy w/ bilateral salpingoophorectomy     No family history on file.  History  Substance Use Topics  . Smoking status: Current Everyday Smoker    Types: Cigarettes  . Smokeless tobacco: Not on file  . Alcohol Use: Yes     socially    OB History    Grav Para Term Preterm Abortions TAB SAB Ect Mult Living                  Review of Systems ROS reviewed and all otherwise negative except for mentioned in HPI  Allergies  Lithium  Home Medications   Current Outpatient Rx  Name Route Sig Dispense Refill  . ARIPIPRAZOLE 2 MG PO TABS Oral Take 2 mg by mouth daily.    . ATORVASTATIN CALCIUM 40 MG PO TABS Oral Take 40 mg by mouth  daily.    Marland Kitchen BENZTROPINE MESYLATE 0.5 MG PO TABS Oral Take 0.5 mg by mouth daily.    . CYCLOBENZAPRINE HCL 10 MG PO TABS Oral Take 1 tablet (10 mg total) by mouth 2 (two) times daily as needed for muscle spasms. 20 tablet 0  . DIVALPROEX SODIUM ER 250 MG PO TB24 Oral Take 750 mg by mouth 2 (two) times daily.     Marland Kitchen ESZOPICLONE 1 MG PO TABS Oral Take 3 mg by mouth at bedtime. Take immediately before bedtime    . FUROSEMIDE 20 MG PO TABS Oral Take 20 mg by mouth daily.    Marland Kitchen LEVOTHYROXINE SODIUM 150 MCG PO TABS Oral Take 150 mcg by mouth daily.    Marland Kitchen METFORMIN HCL 500 MG PO TABS Oral Take 500 mg by mouth 2 (two) times daily with a meal.    . NYSTATIN 100000 UNIT/GM EX CREA Topical Apply 1 application topically 3 (three) times daily as needed. For groin area    . OXYCODONE-ACETAMINOPHEN 5-325 MG PO TABS Oral Take 2 tablets by mouth every 4 (four) hours as needed for pain. 15 tablet 0  . SERTRALINE HCL 100 MG PO TABS Oral Take 100 mg by mouth every morning.    . CYCLOBENZAPRINE HCL  5 MG PO TABS Oral Take 1 tablet (5 mg total) by mouth 3 (three) times daily as needed for muscle spasms. 20 tablet 0  . OXYCODONE-ACETAMINOPHEN 5-325 MG PO TABS Oral Take 1-2 tablets by mouth every 6 (six) hours as needed for pain. 15 tablet 0    BP 120/83  Pulse 98  Temp(Src) 98 F (36.7 C) (Oral)  Resp 18 Vitals reviewed Physical Exam Physical Examination: General appearance - alert, well appearing, and in no distress Mental status - alert, oriented to person, place, and time Mouth - mucous membranes moist, pharynx normal without lesions Chest - clear to auscultation, no wheezes, rales or rhonchi, symmetric air entry Heart - normal rate, regular rhythm, normal S1, S2, no murmurs, rubs, clicks or gallops Abdomen - soft, nontender, nondistended, no masses or organomegaly, nabs Back exam - full range of motion, no tenderness, palpable spasm or pain on motion Neurological - alert, oriented, normal speech, strength 5/5  in extremities x 4, sensation intact Musculoskeletal - no joint tenderness, deformity or swelling Extremities - peripheral pulses normal, no pedal edema, no clubbing or cyanosis Skin - normal coloration and turgor, no rashes  ED Course  Procedures (including critical care time)   Date: 05/30/2011  Rate: 86  Rhythm: normal sinus rhythm  QRS Axis: normal  Intervals: normal  ST/T Wave abnormalities: normal  Conduction Disutrbances:none  Narrative Interpretation:   Old EKG Reviewed: none available    Labs Reviewed  URINALYSIS, ROUTINE W REFLEX MICROSCOPIC - Abnormal; Notable for the following:    Ketones, ur 15 (*)    All other components within normal limits  POCT I-STAT, CHEM 8 - Abnormal; Notable for the following:    Glucose, Bld 105 (*)    Hemoglobin 15.6 (*)    All other components within normal limits   No results found.   1. Low back pain   2. Sciatica of left side       MDM  Pt presents with pain in left leg radiating from left lower back.  She was on her way to PMD appointment today with a CNA and felt some lightheadedness, so CNA referred her to the ED for evaluation.  Pt states she has not eaten today, she is hungry and that is why she feels lightheaded.  States she feels that her leg pain is a muscle cramp that will not release.  No weakness of extremities, no fever or signs or symptoms of cauda equina. Labs reassuring, pt not orthostatic on vital signs.  Pain treated with percocet and flexeril in ED which is what she states has helped her pain in the past.  Discharged with strict return precautions.  Pt agreeable with plan.        Ethelda Chick, MD 06/02/11 [phone]

## 2011-06-18 ENCOUNTER — Other Ambulatory Visit: Payer: Self-pay | Admitting: Orthopedic Surgery

## 2011-06-26 ENCOUNTER — Encounter (HOSPITAL_COMMUNITY): Payer: Self-pay | Admitting: Respiratory Therapy

## 2011-07-01 ENCOUNTER — Inpatient Hospital Stay (HOSPITAL_COMMUNITY): Admission: RE | Admit: 2011-07-01 | Discharge: 2011-07-01 | Payer: Medicare Other | Source: Ambulatory Visit

## 2011-07-01 NOTE — Progress Notes (Signed)
I called pt's cas worker, Wynona Canes from Pscho/therpy services. I called Christine around 1950. Christine's number was on a post it note on the front of Ms. Wilkerson's chart  Wynona Canes said that she had been expecting a call from Short Stay department today before 1700.   Wynona Canes said she had called Carla at [facility] [employer] office several times and was assured that short stay had her number.  Wynona Canes reported that Ms Mom has short term memory and sounds clear on the phone, however she doesn't remember after she hangs up the phone.  Wynona Canes said the her company has worked with Ms Pfahler for years and that they are her trasnsportation to appointments.  "I don't know that she will be able to have surgery tomorrow now, we haven't made arrangements for her transportation and we do not begin work until 0900."  I asked Wynona Canes if I should call patient and she said that pt will not remember the conversation.  I paged PA for Dr Yevette Edwards to inform her of this situation and did not receive a call back from her.  I spoke with Crystal in OR and informed her of the situation.  I left information for [facility], care coordinator in Short Stay A.

## 2011-07-01 NOTE — Pre-Procedure Instructions (Signed)
20 Kelsey Hanna  07/01/2011   Your procedure is scheduled on:  07/02/11  Report to [facility] at 900 AM.  Call this number if you have problems the morning of surgery: [phone]   Remember:   Do not eat food:After Midnight.  May have clear liquids: up to 4 Hours before arrival.  Clear liquids include soda, tea, black coffee, apple or grape juice, broth.  Take these medicines the morning of surgery with A SIP OF WATER: depakote,synthroid,zoloft, cogentin   Do not wear jewelry, make-up or nail polish.  Do not wear lotions, powders, or perfumes. You may wear deodorant.  Do not shave 48 hours prior to surgery. Men may shave face and neck.  Do not bring valuables to the hospital.  Contacts, dentures or bridgework may not be worn into surgery.  Leave suitcase in the car. After surgery it may be brought to your room.  For patients admitted to the hospital, checkout time is 11:00 AM the day of discharge.   Patients discharged the day of surgery will not be allowed to drive home.  Name and phone number of your driver: family  Special Instructions: CHG Shower Use Special Wash: 1/2 bottle night before surgery and 1/2 bottle morning of surgery.   Please read over the following fact sheets that you were given: Pain Booklet, Coughing and Deep Breathing, Blood Transfusion Information, MRSA Information and Surgical Site Infection Prevention

## 2011-07-02 ENCOUNTER — Encounter (HOSPITAL_COMMUNITY): Payer: Self-pay | Admitting: Certified Registered"

## 2011-07-02 ENCOUNTER — Inpatient Hospital Stay (HOSPITAL_COMMUNITY): Admission: RE | Admit: 2011-07-02 | Payer: Medicare Other | Source: Ambulatory Visit | Admitting: Orthopedic Surgery

## 2011-07-02 ENCOUNTER — Encounter (HOSPITAL_COMMUNITY): Admission: RE | Payer: Self-pay | Source: Ambulatory Visit

## 2011-07-02 SURGERY — LUMBAR LAMINECTOMY/DECOMPRESSION MICRODISCECTOMY
Anesthesia: General | Laterality: Left

## 2011-07-02 NOTE — Anesthesia Preprocedure Evaluation (Deleted)
Anesthesia Evaluation    Airway       Dental   Pulmonary sleep apnea ,          Cardiovascular     Neuro/Psych Bipolar Disorder    GI/Hepatic   Endo/Other  Diabetes mellitus-Hypothyroidism   Renal/GU      Musculoskeletal   Abdominal   Peds  Hematology   Anesthesia Other Findings   Reproductive/Obstetrics                           Anesthesia Physical Anesthesia Plan Anesthesia Quick Evaluation

## 2011-07-02 NOTE — H&P (Signed)
PREOPERATIVE H&P  Chief Complaint: left leg pain  HPI: Kelsey Hanna is a 61 y.o. female who presents with left leg pain  Past Medical History  Diagnosis Date  . Bipolar 1 disorder   . Hypothyroid   . Dyslipidemia   . Retinal detachment   . Sleep apnea   . Diabetes mellitus    Past Surgical History  Procedure Date  . Abdominal hysterectomy   . Total abdominal hysterectomy w/ bilateral salpingoophorectomy    History   Social History  . Marital Status: Divorced    Spouse Name: N/A    Number of Children: N/A  . Years of Education: N/A   Social History Main Topics  . Smoking status: Current Everyday Smoker    Types: Cigarettes  . Smokeless tobacco: Not on file  . Alcohol Use: Yes     socially  . Drug Use: No  . Sexually Active:    Other Topics Concern  . Not on file   Social History Narrative  . No narrative on file   No family history on file. Allergies  Allergen Reactions  . Lithium     Reaction=per patient spaces me out   Prior to Admission medications   Medication Sig Start Date End Date Taking? Authorizing Provider  atorvastatin (LIPITOR) 40 MG tablet Take 40 mg by mouth daily.   Yes Historical Provider, MD  benztropine (COGENTIN) 0.5 MG tablet Take 0.5 mg by mouth daily.   Yes Historical Provider, MD  divalproex (DEPAKOTE) 500 MG DR tablet Take 500-1,000 mg by mouth 2 (two) times daily. Take 1 tablet in the morning and 2 tablets in the evening   Yes Historical Provider, MD  Eszopiclone (ESZOPICLONE) 3 MG TABS Take 3 mg by mouth at bedtime. Take immediately before bedtime   Yes Historical Provider, MD  furosemide (LASIX) 20 MG tablet Take 20 mg by mouth daily.   Yes Historical Provider, MD  levothyroxine (SYNTHROID, LEVOTHROID) 150 MCG tablet Take 150 mcg by mouth daily.   Yes Historical Provider, MD  metFORMIN (GLUCOPHAGE) 500 MG tablet Take 500 mg by mouth 2 (two) times daily with a meal.   Yes Historical Provider, MD  nystatin cream (MYCOSTATIN)  Apply 1 application topically 3 (three) times daily as needed. For groin area   Yes Historical Provider, MD  OVER THE COUNTER MEDICATION Take 200 mg by mouth daily. Sodium Chloride   Yes Historical Provider, MD  Paliperidone Palmitate (INVEGA SUSTENNA) 156 MG/ML SUSP Inject 156 mg into the muscle every 30 (thirty) days.   Yes Historical Provider, MD  potassium chloride (K-DUR) 10 MEQ tablet Take 10 mEq by mouth daily.   Yes Historical Provider, MD  sertraline (ZOLOFT) 100 MG tablet Take 100 mg by mouth every morning.   Yes Historical Provider, MD     All other systems have been reviewed and were otherwise negative with the exception of those mentioned in the HPI and as above.  Physical Exam: There were no vitals filed for this visit.  General: Alert, no acute distress Cardiovascular: No pedal edema Respiratory: No cyanosis, no use of accessory musculature GI: No organomegaly, abdomen is soft and non-tender Skin: No lesions in the area of chief complaint Neurologic: Sensation intact distally Psychiatric: Patient is competent for consent with normal mood and affect Lymphatic: No axillary or cervical lymphadenopathy  MUSCULOSKELETAL: + SLR on left  Assessment/Plan: Left leg pain Plan for Procedure(s): LUMBAR LAMINECTOMY/DECOMPRESSION MICRODISCECTOMY, left L4/5   Emilee Hero, MD 07/02/2011 7:24 AM

## 2011-07-02 NOTE — Preoperative (Signed)
Beta Blockers   Reason not to administer Beta Blockers:Not Applicable 

## 2011-07-09 ENCOUNTER — Inpatient Hospital Stay (HOSPITAL_COMMUNITY): Admission: RE | Admit: 2011-07-09 | Discharge: 2011-07-09 | Payer: Medicare Other | Source: Ambulatory Visit

## 2011-07-09 ENCOUNTER — Encounter (HOSPITAL_COMMUNITY): Payer: Self-pay

## 2011-07-09 NOTE — Progress Notes (Signed)
I  HAVE REQUESTED NOTES AND EKG N CXR  FROM HER PCP---?? (NOT SURE I'M RIGHT-- DR RANDALL HARRIS @ [facility])

## 2011-07-10 ENCOUNTER — Encounter (HOSPITAL_COMMUNITY): Payer: Self-pay | Admitting: Pharmacy Technician

## 2011-07-11 ENCOUNTER — Encounter (HOSPITAL_COMMUNITY)
Admission: RE | Admit: 2011-07-11 | Discharge: 2011-07-11 | Disposition: A | Discharge status: Home / Self Care | Payer: Medicare Other | Source: Ambulatory Visit | Attending: Orthopedic Surgery | Admitting: Orthopedic Surgery

## 2011-07-11 LAB — DIFFERENTIAL
Eosinophils Relative: 2 % (ref 0–5)
Lymphocytes Relative: 37 % (ref 12–46)
Lymphs Abs: 3.3 10*3/uL (ref 0.7–4.0)
Monocytes Relative: 8 % (ref 3–12)
Neutrophils Relative %: 53 % (ref 43–77)

## 2011-07-11 LAB — COMPREHENSIVE METABOLIC PANEL
ALT: 17 U/L (ref 0–35)
Albumin: 3.6 g/dL (ref 3.5–5.2)
Calcium: 9.8 mg/dL (ref 8.4–10.5)
GFR calc Af Amer: >90 mL/min (ref >90)
Glucose, Bld: 103 mg/dL — ABNORMAL HIGH (ref 70–99)
Sodium: 137 mEq/L (ref 135–145)
Total Protein: 7.2 g/dL (ref 6.0–8.3)

## 2011-07-11 LAB — URINE MICROSCOPIC-ADD ON

## 2011-07-11 LAB — CBC
Hemoglobin: 14 g/dL (ref 12.0–15.0)
MCH: 29.9 pg (ref 26.0–34.0)
MCHC: 33.5 g/dL (ref 30.0–36.0)
RDW: 13.8 % (ref 11.5–15.5)

## 2011-07-11 LAB — ABO/RH: ABO/RH(D): A POS

## 2011-07-11 LAB — TYPE AND SCREEN: Antibody Screen: NEGATIVE

## 2011-07-11 LAB — URINALYSIS, ROUTINE W REFLEX MICROSCOPIC
Glucose, UA: NEGATIVE mg/dL
Hgb urine dipstick: NEGATIVE
Protein, ur: NEGATIVE mg/dL
Urobilinogen, UA: 1 mg/dL (ref 0.0–1.0)

## 2011-07-11 LAB — PROTIME-INR
INR: 0.92 (ref 0.00–1.49)
Prothrombin Time: 12.6 seconds (ref 11.6–15.2)

## 2011-07-11 LAB — SURGICAL PCR SCREEN
MRSA, PCR: POSITIVE — AB
Staphylococcus aureus: POSITIVE — AB

## 2011-07-11 LAB — APTT: aPTT: 27 seconds (ref 24–37)

## 2011-07-11 NOTE — Pre-Procedure Instructions (Signed)
20 Kelsey Hanna  07/11/2011   Your procedure is scheduled on:  May 30  Report to [facility] at 1100 AM.  Call this number if you have problems the morning of surgery: [phone]   Remember:   Do not eat food:After Midnight.  May have clear liquids: up to 4 Hours before arrival.  Clear liquids include soda, tea, black coffee, apple or grape juice, broth.  Take these medicines the morning of surgery with A SIP OF WATER: depakote,synthroid,zoloft, cogentin   Do not wear jewelry, make-up or nail polish.  Do not wear lotions, powders, or perfumes. You may wear deodorant.  Do not shave 48 hours prior to surgery. Men may shave face and neck.  Do not bring valuables to the hospital.  Contacts, dentures or bridgework may not be worn into surgery.  Leave suitcase in the car. After surgery it may be brought to your room.  For patients admitted to the hospital, checkout time is 11:00 AM the day of discharge.   Patients discharged the day of surgery will not be allowed to drive home.  Name and phone number of your driver: family  Special Instructions: CHG Shower Use Special Wash: 1/2 bottle night before surgery and 1/2 bottle morning of surgery.   Please read over the following fact sheets that you were given: Pain Booklet, Coughing and Deep Breathing, Blood Transfusion Information, MRSA Information and Surgical Site Infection Prevention

## 2011-07-16 MED ORDER — POVIDONE-IODINE 7.5 % EX SOLN
Freq: Once | CUTANEOUS | Status: DC
  Filled 2011-07-16: qty 118

## 2011-07-17 ENCOUNTER — Encounter (HOSPITAL_COMMUNITY): Payer: Self-pay | Admitting: Anesthesiology

## 2011-07-17 ENCOUNTER — Encounter (HOSPITAL_COMMUNITY)
Admission: RE | Disposition: A | Discharge status: Home / Self Care | Payer: Self-pay | Source: Ambulatory Visit | Attending: Orthopedic Surgery

## 2011-07-17 ENCOUNTER — Inpatient Hospital Stay (HOSPITAL_COMMUNITY): Payer: Medicare Other | Admitting: Anesthesiology

## 2011-07-17 ENCOUNTER — Encounter (HOSPITAL_COMMUNITY): Payer: Self-pay | Admitting: *Deleted

## 2011-07-17 ENCOUNTER — Inpatient Hospital Stay (HOSPITAL_COMMUNITY): Payer: Medicare Other

## 2011-07-17 ENCOUNTER — Inpatient Hospital Stay (HOSPITAL_COMMUNITY)
Admission: RE | Admit: 2011-07-17 | Discharge: 2011-07-18 | DRG: 491 | Disposition: A | Discharge status: Home / Self Care | Payer: Medicare Other | Source: Ambulatory Visit | Attending: Orthopedic Surgery | Admitting: Orthopedic Surgery

## 2011-07-17 DIAGNOSIS — E119 Type 2 diabetes mellitus without complications: Secondary | ICD-10-CM | POA: Diagnosis present

## 2011-07-17 DIAGNOSIS — G4730 Sleep apnea, unspecified: Secondary | ICD-10-CM | POA: Diagnosis present

## 2011-07-17 DIAGNOSIS — E039 Hypothyroidism, unspecified: Secondary | ICD-10-CM | POA: Diagnosis present

## 2011-07-17 DIAGNOSIS — IMO0002 Reserved for concepts with insufficient information to code with codable children: Secondary | ICD-10-CM | POA: Diagnosis present

## 2011-07-17 DIAGNOSIS — Z87891 Personal history of nicotine dependence: Secondary | ICD-10-CM

## 2011-07-17 DIAGNOSIS — M5126 Other intervertebral disc displacement, lumbar region: Principal | ICD-10-CM | POA: Diagnosis present

## 2011-07-17 DIAGNOSIS — E785 Hyperlipidemia, unspecified: Secondary | ICD-10-CM | POA: Diagnosis present

## 2011-07-17 DIAGNOSIS — I959 Hypotension, unspecified: Secondary | ICD-10-CM | POA: Diagnosis present

## 2011-07-17 DIAGNOSIS — Z79899 Other long term (current) drug therapy: Secondary | ICD-10-CM

## 2011-07-17 DIAGNOSIS — F319 Bipolar disorder, unspecified: Secondary | ICD-10-CM | POA: Diagnosis present

## 2011-07-17 DIAGNOSIS — M5410 Radiculopathy, site unspecified: Secondary | ICD-10-CM

## 2011-07-17 DIAGNOSIS — Z888 Allergy status to other drugs, medicaments and biological substances status: Secondary | ICD-10-CM

## 2011-07-17 DIAGNOSIS — Z01812 Encounter for preprocedural laboratory examination: Secondary | ICD-10-CM

## 2011-07-17 DIAGNOSIS — Z8542 Personal history of malignant neoplasm of other parts of uterus: Secondary | ICD-10-CM

## 2011-07-17 HISTORY — PX: LUMBAR LAMINECTOMY/DECOMPRESSION MICRODISCECTOMY: SHX5026

## 2011-07-17 LAB — GLUCOSE, CAPILLARY: Glucose-Capillary: 98 mg/dL (ref 70–99)

## 2011-07-17 SURGERY — LUMBAR LAMINECTOMY/DECOMPRESSION MICRODISCECTOMY
Anesthesia: General | Site: Spine Lumbar | Laterality: Left | Wound class: Clean

## 2011-07-17 MED ORDER — PALIPERIDONE PALMITATE 156 MG/ML IM SUSP: 156.0000 mg | INTRAMUSCULAR | Status: DC

## 2011-07-17 MED ORDER — ATORVASTATIN CALCIUM 40 MG PO TABS
40.0000 mg | ORAL_TABLET | Freq: Every day | ORAL | Status: DC
  Administered 2011-07-18: 40 mg via ORAL
  Filled 2011-07-17: qty 1

## 2011-07-17 MED ORDER — ONDANSETRON HCL 4 MG/2ML IJ SOLN
INTRAMUSCULAR | Status: DC | PRN
  Administered 2011-07-17: 4 mg via INTRAVENOUS

## 2011-07-17 MED ORDER — ZOLPIDEM TARTRATE 5 MG PO TABS
5.0000 mg | ORAL_TABLET | Freq: Every evening | ORAL | Status: DC | PRN
  Administered 2011-07-17: 5 mg via ORAL
  Filled 2011-07-17: qty 1

## 2011-07-17 MED ORDER — ACETAMINOPHEN 650 MG RE SUPP: 650.0000 mg | RECTAL | Status: DC | PRN

## 2011-07-17 MED ORDER — PHENYLEPHRINE HCL 10 MG/ML IJ SOLN
INTRAMUSCULAR | Status: DC | PRN
  Administered 2011-07-17 (×2): 80 ug via INTRAVENOUS
  Administered 2011-07-17 (×3): 40 ug via INTRAVENOUS

## 2011-07-17 MED ORDER — POTASSIUM CHLORIDE IN NACL 20-0.9 MEQ/L-% IV SOLN
INTRAVENOUS | Status: DC
  Administered 2011-07-17: 17:00:00 via INTRAVENOUS
  Filled 2011-07-17 (×6): qty 1000

## 2011-07-17 MED ORDER — CEFAZOLIN SODIUM 1-5 GM-% IV SOLN
INTRAVENOUS | Status: DC | PRN
  Administered 2011-07-17 (×2): 1 g via INTRAVENOUS

## 2011-07-17 MED ORDER — THROMBIN 20000 UNITS EX KIT
PACK | CUTANEOUS | Status: DC | PRN
  Administered 2011-07-17: 14:00:00 via TOPICAL

## 2011-07-17 MED ORDER — MIDAZOLAM HCL 5 MG/5ML IJ SOLN
INTRAMUSCULAR | Status: DC | PRN
  Administered 2011-07-17: 2 mg via INTRAVENOUS

## 2011-07-17 MED ORDER — BACITRACIN ZINC 500 UNIT/GM EX OINT
TOPICAL_OINTMENT | CUTANEOUS | Status: DC | PRN
  Administered 2011-07-17: 1 via TOPICAL

## 2011-07-17 MED ORDER — PROMETHAZINE HCL 25 MG/ML IJ SOLN: 6.2500 mg | INTRAMUSCULAR | Status: DC | PRN

## 2011-07-17 MED ORDER — HYDROMORPHONE HCL PF 1 MG/ML IJ SOLN
0.2500 mg | INTRAMUSCULAR | Status: DC | PRN
  Administered 2011-07-17 (×4): 0.5 mg via INTRAVENOUS

## 2011-07-17 MED ORDER — MENTHOL 3 MG MT LOZG: 1.0000 | LOZENGE | OROMUCOSAL | Status: DC | PRN

## 2011-07-17 MED ORDER — ONDANSETRON HCL 4 MG/2ML IJ SOLN
INTRAMUSCULAR | Status: AC
  Administered 2011-07-17: 4 mg via INTRAVENOUS
  Filled 2011-07-17: qty 2

## 2011-07-17 MED ORDER — SODIUM CHLORIDE 0.9 % IV SOLN: 250.0000 mL | INTRAVENOUS | Status: DC

## 2011-07-17 MED ORDER — METFORMIN HCL 500 MG PO TABS
500.0000 mg | ORAL_TABLET | Freq: Two times a day (BID) | ORAL | Status: DC
  Administered 2011-07-18: 500 mg via ORAL
  Filled 2011-07-17 (×4): qty 1

## 2011-07-17 MED ORDER — ROCURONIUM BROMIDE 100 MG/10ML IV SOLN
INTRAVENOUS | Status: DC | PRN
  Administered 2011-07-17: 10 mg via INTRAVENOUS
  Administered 2011-07-17: 20 mg via INTRAVENOUS
  Administered 2011-07-17: 40 mg via INTRAVENOUS

## 2011-07-17 MED ORDER — LACTATED RINGERS IV SOLN
INTRAVENOUS | Status: DC | PRN
  Administered 2011-07-17 (×2): via INTRAVENOUS

## 2011-07-17 MED ORDER — DOCUSATE SODIUM 100 MG PO CAPS
100.0000 mg | ORAL_CAPSULE | Freq: Two times a day (BID) | ORAL | Status: DC
  Administered 2011-07-17 – 2011-07-18 (×2): 100 mg via ORAL
  Filled 2011-07-17 (×3): qty 1

## 2011-07-17 MED ORDER — BENZTROPINE MESYLATE 0.5 MG PO TABS
0.5000 mg | ORAL_TABLET | Freq: Every day | ORAL | Status: DC
  Administered 2011-07-18: 0.5 mg via ORAL
  Filled 2011-07-17 (×2): qty 1

## 2011-07-17 MED ORDER — MEPERIDINE HCL 25 MG/ML IJ SOLN: 6.2500 mg | INTRAMUSCULAR | Status: DC | PRN

## 2011-07-17 MED ORDER — OXYCODONE-ACETAMINOPHEN 5-325 MG PO TABS
1.0000 | ORAL_TABLET | ORAL | Status: DC | PRN
  Administered 2011-07-17 – 2011-07-18 (×4): 2 via ORAL
  Filled 2011-07-17 (×4): qty 2

## 2011-07-17 MED ORDER — BUPIVACAINE-EPINEPHRINE 0.25% -1:200000 IJ SOLN
INTRAMUSCULAR | Status: DC | PRN
  Administered 2011-07-17: 5 mL

## 2011-07-17 MED ORDER — NEOSTIGMINE METHYLSULFATE 1 MG/ML IJ SOLN
INTRAMUSCULAR | Status: DC | PRN
  Administered 2011-07-17: 4 mg via INTRAVENOUS

## 2011-07-17 MED ORDER — MORPHINE SULFATE 2 MG/ML IJ SOLN: 2.0000 mg | INTRAMUSCULAR | Status: DC | PRN

## 2011-07-17 MED ORDER — SERTRALINE HCL 100 MG PO TABS
100.0000 mg | ORAL_TABLET | Freq: Every day | ORAL | Status: DC
  Administered 2011-07-18: 100 mg via ORAL
  Filled 2011-07-17 (×2): qty 1

## 2011-07-17 MED ORDER — SODIUM CHLORIDE 0.9 % IJ SOLN: 3.0000 mL | Freq: Two times a day (BID) | INTRAMUSCULAR | Status: DC

## 2011-07-17 MED ORDER — SODIUM CHLORIDE 0.9 % IJ SOLN: 3.0000 mL | INTRAMUSCULAR | Status: DC | PRN

## 2011-07-17 MED ORDER — ONDANSETRON HCL 4 MG/2ML IJ SOLN
4.0000 mg | INTRAMUSCULAR | Status: DC | PRN
  Administered 2011-07-17 (×2): 4 mg via INTRAVENOUS
  Filled 2011-07-17: qty 2

## 2011-07-17 MED ORDER — METHYLPREDNISOLONE ACETATE 40 MG/ML IJ SUSP
INTRAMUSCULAR | Status: DC | PRN
  Administered 2011-07-17: 40 mg via INTRA_ARTICULAR

## 2011-07-17 MED ORDER — LIDOCAINE HCL (CARDIAC) 20 MG/ML IV SOLN
INTRAVENOUS | Status: DC | PRN
  Administered 2011-07-17: 100 mg via INTRAVENOUS

## 2011-07-17 MED ORDER — DIVALPROEX SODIUM 500 MG PO DR TAB
1000.0000 mg | DELAYED_RELEASE_TABLET | Freq: Every day | ORAL | Status: DC
  Administered 2011-07-17: 1000 mg via ORAL
  Filled 2011-07-17 (×2): qty 2

## 2011-07-17 MED ORDER — LACTATED RINGERS IV SOLN
INTRAVENOUS | Status: DC
  Administered 2011-07-17: 13:00:00 via INTRAVENOUS

## 2011-07-17 MED ORDER — FUROSEMIDE 20 MG PO TABS
20.0000 mg | ORAL_TABLET | Freq: Every day | ORAL | Status: DC
  Filled 2011-07-17 (×2): qty 1

## 2011-07-17 MED ORDER — GLYCOPYRROLATE 0.2 MG/ML IJ SOLN
INTRAMUSCULAR | Status: DC | PRN
  Administered 2011-07-17: .6 mg via INTRAVENOUS

## 2011-07-17 MED ORDER — SUFENTANIL CITRATE 50 MCG/ML IV SOLN
INTRAVENOUS | Status: DC | PRN
  Administered 2011-07-17: 5 ug via INTRAVENOUS
  Administered 2011-07-17 (×2): 10 ug via INTRAVENOUS

## 2011-07-17 MED ORDER — DIVALPROEX SODIUM 500 MG PO DR TAB
500.0000 mg | DELAYED_RELEASE_TABLET | Freq: Every day | ORAL | Status: DC
  Administered 2011-07-18: 500 mg via ORAL
  Filled 2011-07-17: qty 1

## 2011-07-17 MED ORDER — ACETAMINOPHEN 325 MG PO TABS: 650.0000 mg | ORAL_TABLET | ORAL | Status: DC | PRN

## 2011-07-17 MED ORDER — THROMBIN 20000 UNITS EX KIT
PACK | CUTANEOUS | Status: DC | PRN
  Administered 2011-07-17: 20000 [IU] via TOPICAL

## 2011-07-17 MED ORDER — PROPOFOL 10 MG/ML IV EMUL
INTRAVENOUS | Status: DC | PRN
  Administered 2011-07-17: 150 mg via INTRAVENOUS

## 2011-07-17 MED ORDER — CEFAZOLIN SODIUM 1-5 GM-% IV SOLN
INTRAVENOUS | Status: AC
  Filled 2011-07-17: qty 100

## 2011-07-17 MED ORDER — SENNA 8.6 MG PO TABS
1.0000 | ORAL_TABLET | Freq: Two times a day (BID) | ORAL | Status: DC
  Administered 2011-07-17 – 2011-07-18 (×2): 8.6 mg via ORAL
  Filled 2011-07-17 (×3): qty 1

## 2011-07-17 MED ORDER — PHENOL 1.4 % MT LIQD: 1.0000 | OROMUCOSAL | Status: DC | PRN

## 2011-07-17 MED ORDER — PROMETHAZINE HCL 25 MG/ML IJ SOLN
12.5000 mg | INTRAMUSCULAR | Status: DC | PRN
  Administered 2011-07-17: 12.5 mg via INTRAVENOUS
  Filled 2011-07-17 (×2): qty 1

## 2011-07-17 MED ORDER — DIAZEPAM 5 MG PO TABS
5.0000 mg | ORAL_TABLET | Freq: Four times a day (QID) | ORAL | Status: DC | PRN
  Administered 2011-07-17: 5 mg via ORAL
  Filled 2011-07-17: qty 1

## 2011-07-17 MED ORDER — ZOLPIDEM TARTRATE 10 MG PO TABS: 10.0000 mg | ORAL_TABLET | Freq: Every evening | ORAL | Status: DC | PRN

## 2011-07-17 MED ORDER — CEFAZOLIN SODIUM 1-5 GM-% IV SOLN
1.0000 g | Freq: Three times a day (TID) | INTRAVENOUS | Status: AC
  Administered 2011-07-17 – 2011-07-18 (×2): 1 g via INTRAVENOUS
  Filled 2011-07-17 (×2): qty 50

## 2011-07-17 MED ORDER — POTASSIUM CHLORIDE ER 10 MEQ PO TBCR
10.0000 meq | EXTENDED_RELEASE_TABLET | Freq: Every day | ORAL | Status: DC
  Administered 2011-07-18: 10 meq via ORAL
  Filled 2011-07-17 (×2): qty 1

## 2011-07-17 MED ORDER — LEVOTHYROXINE SODIUM 150 MCG PO TABS
150.0000 ug | ORAL_TABLET | Freq: Every day | ORAL | Status: DC
  Administered 2011-07-18: 150 ug via ORAL
  Filled 2011-07-17 (×2): qty 1

## 2011-07-17 MED ORDER — LACTATED RINGERS IV SOLN: INTRAVENOUS | Status: DC

## 2011-07-17 MED ORDER — ALUM & MAG HYDROXIDE-SIMETH 200-200-20 MG/5ML PO SUSP: 30.0000 mL | Freq: Four times a day (QID) | ORAL | Status: DC | PRN

## 2011-07-17 SURGICAL SUPPLY — 60 items
APL SKNCLS STERI-STRIP NONHPOA (GAUZE/BANDAGES/DRESSINGS)
BENZOIN TINCTURE PRP APPL 2/3 (GAUZE/BANDAGES/DRESSINGS) IMPLANT
BUR ROUND PRECISION 4.0 (BURR) ×2 IMPLANT
CANISTER SUCTION 2500CC (MISCELLANEOUS) ×2 IMPLANT
CLOTH BEACON ORANGE TIMEOUT ST (SAFETY) ×2 IMPLANT
CONT SPEC STER OR (MISCELLANEOUS) ×2 IMPLANT
CORDS BIPOLAR (ELECTRODE) ×2 IMPLANT
COVER SURGICAL LIGHT HANDLE (MISCELLANEOUS) ×2 IMPLANT
DRAIN CHANNEL 10F 3/8 F FF (DRAIN) IMPLANT
DRAPE POUCH INSTRU U-SHP 10X18 (DRAPES) ×4 IMPLANT
DRAPE SURG 17X23 STRL (DRAPES) ×8 IMPLANT
DURAPREP 26ML APPLICATOR (WOUND CARE) ×2 IMPLANT
ELECT BLADE 4.0 EZ CLEAN MEGAD (MISCELLANEOUS)
ELECT BLADE 6.5 EXT (BLADE) IMPLANT
ELECT CAUTERY BLADE 6.4 (BLADE) ×2 IMPLANT
ELECT REM PT RETURN 9FT ADLT (ELECTROSURGICAL) ×2
ELECTRODE BLDE 4.0 EZ CLN MEGD (MISCELLANEOUS) IMPLANT
ELECTRODE REM PT RTRN 9FT ADLT (ELECTROSURGICAL) ×1 IMPLANT
EVACUATOR SILICONE 100CC (DRAIN) IMPLANT
FILTER STRAW FLUID ASPIR (MISCELLANEOUS) ×2 IMPLANT
GAUZE SPONGE 4X4 16PLY XRAY LF (GAUZE/BANDAGES/DRESSINGS) ×4 IMPLANT
GLOVE BIO SURGEON STRL SZ8 (GLOVE) ×2 IMPLANT
GLOVE BIOGEL PI IND STRL 8 (GLOVE) ×1 IMPLANT
GLOVE BIOGEL PI INDICATOR 8 (GLOVE) ×1
GOWN STRL NON-REIN LRG LVL3 (GOWN DISPOSABLE) ×4 IMPLANT
IV CATH 14GX2 1/4 (CATHETERS) ×2 IMPLANT
KIT BASIN OR (CUSTOM PROCEDURE TRAY) ×2 IMPLANT
KIT ROOM TURNOVER OR (KITS) ×2 IMPLANT
NDL 18GX1X1/2 (RX/OR ONLY) (NEEDLE) ×1 IMPLANT
NDL SPNL 18GX3.5 QUINCKE PK (NEEDLE) ×2 IMPLANT
NEEDLE 18GX1X1/2 (RX/OR ONLY) (NEEDLE) ×2 IMPLANT
NEEDLE HYPO 25GX1X1/2 BEV (NEEDLE) ×2 IMPLANT
NEEDLE SPNL 18GX3.5 QUINCKE PK (NEEDLE) ×4 IMPLANT
NS IRRIG 1000ML POUR BTL (IV SOLUTION) ×2 IMPLANT
PACK LAMINECTOMY ORTHO (CUSTOM PROCEDURE TRAY) ×2 IMPLANT
PACK UNIVERSAL I (CUSTOM PROCEDURE TRAY) ×2 IMPLANT
PAD ARMBOARD 7.5X6 YLW CONV (MISCELLANEOUS) ×4 IMPLANT
PATTIES SURGICAL .5 X.5 (GAUZE/BANDAGES/DRESSINGS) IMPLANT
PATTIES SURGICAL .5 X1 (DISPOSABLE) ×2 IMPLANT
PATTIES SURGICAL 1X1 (DISPOSABLE) IMPLANT
SPONGE GAUZE 4X4 12PLY (GAUZE/BANDAGES/DRESSINGS) ×2 IMPLANT
STRIP CLOSURE SKIN 1/2X4 (GAUZE/BANDAGES/DRESSINGS) IMPLANT
SURGIFLO TRUKIT (HEMOSTASIS) IMPLANT
SUT ETHILON 3 0 FSL (SUTURE) IMPLANT
SUT VIC AB 0 CT1 27 (SUTURE)
SUT VIC AB 0 CT1 27XBRD ANBCTR (SUTURE) IMPLANT
SUT VIC AB 0 CT2 27 (SUTURE) ×2 IMPLANT
SUT VIC AB 1 CT1 18XCR BRD 8 (SUTURE) ×1 IMPLANT
SUT VIC AB 1 CT1 8-18 (SUTURE) ×2
SUT VIC AB 2-0 CT2 18 VCP726D (SUTURE) ×2 IMPLANT
SYR 20CC LL (SYRINGE) IMPLANT
SYR BULB IRRIGATION 50ML (SYRINGE) ×2 IMPLANT
SYR CONTROL 10ML LL (SYRINGE) ×2 IMPLANT
SYR TB 1ML 26GX3/8 SAFETY (SYRINGE) ×4 IMPLANT
SYR TB 1ML LUER SLIP (SYRINGE) ×4 IMPLANT
TAPE CLOTH SURG 4X10 WHT LF (GAUZE/BANDAGES/DRESSINGS) ×2 IMPLANT
TOWEL OR 17X24 6PK STRL BLUE (TOWEL DISPOSABLE) ×2 IMPLANT
TOWEL OR 17X26 10 PK STRL BLUE (TOWEL DISPOSABLE) ×2 IMPLANT
WATER STERILE IRR 1000ML POUR (IV SOLUTION) ×2 IMPLANT
YANKAUER SUCT BULB TIP NO VENT (SUCTIONS) ×2 IMPLANT

## 2011-07-17 NOTE — Plan of Care (Signed)
Problem: Consults Goal: Diagnosis - Spinal Surgery Outcome: Completed/Met Date Met:  07/17/11 Microdiscectomy

## 2011-07-17 NOTE — Transfer of Care (Signed)
Immediate Anesthesia Transfer of Care Note  Patient: Kelsey Hanna  Procedure(s) Performed: Procedure(s) (LRB): LUMBAR LAMINECTOMY/DECOMPRESSION MICRODISCECTOMY (Left)  Patient Location: PACU  Anesthesia Type: General  Level of Consciousness: awake and alert   Airway & Oxygen Therapy: Patient Spontanous Breathing and Patient connected to nasal cannula oxygen  Post-op Assessment: Report given to PACU RN, Post -op Vital signs reviewed and stable and Patient moving all extremities  Post vital signs: Reviewed and stable  Complications: No apparent anesthesia complications

## 2011-07-17 NOTE — Op Note (Signed)
NAME:  Kelsey Hanna, Kelsey Hanna NO.:  [id number]  MEDICAL RECORD NO.:  [id number]  LOCATION:  5015                         FACILITY:  [facility]  PHYSICIAN:  Estill Bamberg, MD      DATE OF BIRTH:  1950/03/18  DATE OF PROCEDURE:  07/17/2011 DATE OF DISCHARGE:                              OPERATIVE REPORT   PREOPERATIVE DIAGNOSES: 1. Left-sided L5 radiculopathy. 2. Left-sided L4-5 disk extrusion with migration behind the L4     vertebral body resulting in L5 radiculopathy on the left.  POSTOPERATIVE DIAGNOSES: 1. Left-sided L5 radiculopathy. 2. Left-sided L4-5 disk extrusion with migration behind the L4     vertebral body resulting in L5 radiculopathy on the left.  PROCEDURES:  Left-sided L4-5 laminotomy, partial facetectomy, foraminotomy with complex removal of herniated and extruded L4-5 disk fragment behind the L4 vertebral body.  SURGEON:  Estill Bamberg, MD  ASSISTANT:  None.  ANESTHESIA:  General endotracheal anesthesia.  COMPLICATIONS:  None.  DISPOSITION:  Stable.  ESTIMATED BLOOD LOSS:  Minimal.  INDICATIONS FOR PROCEDURE:  Briefly, Ms. Murley is a 61 year old female who presented to me with severe pain in the left leg.  I did ultimately review an MRI, which was notable for a left-sided extruded fragment located behind the L4 vertebral body.  The patient's pain was very much consistent with left-sided L5 radiculopathy.  Given the severe of the pain and her failure of nonoperative measures, the patient and myself did make a decision to go forward with a microdiskectomy procedure.  The patient fully understood the risks and limitations of the procedure as outlined in my preoperative note.  OPERATIVE DETAILS:  On Jul 17, 2011, the patient was brought to Surgery and general endotracheal anesthesia was administered.  The patient was placed prone on a well-padded flat Jackson bed with a Wilson frame.  All bony prominences were meticulously padded.   Antibiotics were given. Time-out procedure was performed.  I then made an incision just superior to the patient's previous incision for her L5-S1 decompression.  I then identified the L4 and L5 lamina.  I did obtain a lateral intraoperative radiograph to confirm the appropriate operative level.  I then used a high-speed bur to remove the medial and inferior aspect of the L4 lamina.  The ligamentum flavum was identified and removed.  The traversing L5 nerve was readily identified and noted to be under compression.  I was ultimately able to retract the nerve medially.  I then identified a protruded disk fragment at the L4-5 level and it was clear that there was an extruded fragment located superiorly behind the L4 vertebral body.  Removing the compression was a complex and rather involved procedure.  There was extensive epidural bleeding and the disk fragment was very much adherent to the bone, both above and below the actual disk itself.  With an assistant continuing to hold medial retraction, I did use a series of curettes, including reverse angled curettes to displace the adherent fragments into the intervertebral space.  The fragments were then removed using a series of micropituitary rongeurs.  This was a rather difficult and challenging aspect of the procedure.  Normally, this part of the procedure takes approximately 5-  10 minutes, but in this particular case, it took approximately 60 minutes, given the complex extrusion and the fact that it was very much adherent to the bone.  However, at the termination of the procedure, I did feel that an adequate decompression was performed.  I was able to mobilize the L5 nerve both medially and laterally, and there was no undue pressure noted.  I then went forward with exploring the epidural space to control epidural bleeding.  I did use both bipolar electrocautery in addition to FloSeal.  Period of bleeding was mostly controlled; however, I did  make a decision to place a #15 Blake drain in the epidural space to prevent the occurrence of cauda equina syndrome and an epidural hematoma.  The wound was then copiously irrigated.  I then infiltrated 40 mg Depo-Medrol about the epidural space in the area of the traversing L5 nerve.  The fascia was then closed using 0 Vicryl, and the deep subcutaneous layer was closed using #1 Vicryl.  The superficial subcutaneous layer was closed using 2-0 Vicryl and the skin was closed using 3-0 nylon.  All instrument counts were correct at the termination of the procedure.     Estill Bamberg, MD     MD/MEDQ  D:  07/17/2011  T:  07/17/2011  Job:  [id number]

## 2011-07-17 NOTE — Anesthesia Procedure Notes (Signed)
Procedure Name: Intubation Date/Time: 07/17/2011 1:05 PM Performed by: Brien Mates DOBSON Pre-anesthesia Checklist: Patient identified and Timeout performed Patient Re-evaluated:Patient Re-evaluated prior to inductionOxygen Delivery Method: Circle system utilized Preoxygenation: Pre-oxygenation with 100% oxygen Intubation Type: IV induction Ventilation: Mask ventilation without difficulty Laryngoscope Size: Miller and 2 Grade View: Grade I Tube type: Oral Tube size: 7.0 mm Number of attempts: 1 Airway Equipment and Method: Stylet Placement Confirmation: ETT inserted through vocal cords under direct vision,  positive ETCO2 and breath sounds checked- equal and bilateral Secured at: 21 cm Tube secured with: Tape Dental Injury: Teeth and Oropharynx as per pre-operative assessment

## 2011-07-17 NOTE — Anesthesia Postprocedure Evaluation (Signed)
  Anesthesia Post-op Note  Patient: Kelsey Hanna  Procedure(s) Performed: Procedure(s) (LRB): LUMBAR LAMINECTOMY/DECOMPRESSION MICRODISCECTOMY (Left)  Patient Location: PACU  Anesthesia Type: General  Level of Consciousness: awake  Airway and Oxygen Therapy: Patient Spontanous Breathing  Post-op Pain: mild  Post-op Assessment: Post-op Vital signs reviewed  Post-op Vital Signs: Reviewed  Complications: No apparent anesthesia complications

## 2011-07-17 NOTE — Preoperative (Signed)
Beta Blockers   Reason not to administer Beta Blockers:Not Applicable 

## 2011-07-17 NOTE — Progress Notes (Signed)
Support person accompanied pt. For [employer].  She reports pt. will not have any support person with her in her home Sat. Or Sun. 6/1 & 6/02

## 2011-07-17 NOTE — Progress Notes (Signed)
Pt having unrelieved N/V despite Zofran given at 1745.  Gus Puma, PA on-call notified and order received for phenergan PRN.

## 2011-07-17 NOTE — Anesthesia Preprocedure Evaluation (Addendum)
Anesthesia Evaluation  Patient identified by MRN, date of birth, ID band Patient awake and Patient confused    Reviewed: Allergy & Precautions, H&P , NPO status , Patient's Chart, lab work & pertinent test results, reviewed documented beta blocker date and time   Airway Mallampati: II TM Distance: >3 FB Neck ROM: Full    Dental No notable dental hx. (+) Edentulous Lower and Edentulous Upper   Pulmonary neg pulmonary ROS, sleep apnea ,  breath sounds clear to auscultation  Pulmonary exam normal       Cardiovascular negative cardio ROS  Rhythm:Regular Rate:Normal     Neuro/Psych Bipolar Disorder negative neurological ROS  negative psych ROS   GI/Hepatic negative GI ROS, Neg liver ROS,   Endo/Other  negative endocrine ROSDiabetes mellitus-, Type 2, Oral Hypoglycemic AgentsHypothyroidism   Renal/GU negative Renal ROS  negative genitourinary   Musculoskeletal negative musculoskeletal ROS (+)   Abdominal   Peds negative pediatric ROS (+)  Hematology negative hematology ROS (+)   Anesthesia Other Findings   Reproductive/Obstetrics negative OB ROS                         Anesthesia Physical Anesthesia Plan  ASA: III  Anesthesia Plan: General   Post-op Pain Management:    Induction: Intravenous  Airway Management Planned: Oral ETT  Additional Equipment:   Intra-op Plan:   Post-operative Plan: Extubation in OR  Informed Consent: I have reviewed the patients History and Physical, chart, labs and discussed the procedure including the risks, benefits and alternatives for the proposed anesthesia with the patient or authorized representative who has indicated his/her understanding and acceptance.   Dental advisory given  Plan Discussed with: CRNA, Anesthesiologist and Surgeon  Anesthesia Plan Comments:        Anesthesia Quick Evaluation

## 2011-07-17 NOTE — H&P (Signed)
PREOPERATIVE H&P  Chief Complaint: left leg pain  HPI: Kelsey Hanna is a 61 y.o. female who presents with left leg pain  Past Medical History  Diagnosis Date  . Bipolar 1 disorder   . Hypothyroid   . Dyslipidemia   . Retinal detachment   . Sleep apnea   . Diabetes mellitus     dx within last yr....just takes pills  . Cancer     cancer of uterus...no chemo or radiation   Past Surgical History  Procedure Date  . Abdominal hysterectomy   . Total abdominal hysterectomy w/ bilateral salpingoophorectomy   . Tonsillectomy    History   Social History  . Marital Status: Divorced    Spouse Name: N/A    Number of Children: N/A  . Years of Education: N/A   Social History Main Topics  . Smoking status: Former Smoker -- 2.0 packs/day for 35 years    Types: Cigarettes    Quit date: 07/09/1999  . Smokeless tobacco: Not on file  . Alcohol Use: Yes     socially  . Drug Use: No  . Sexually Active:    Other Topics Concern  . Not on file   Social History Narrative  . No narrative on file   No family history on file. Allergies  Allergen Reactions  . Lithium     Reaction=per patient spaces me out   Prior to Admission medications   Medication Sig Start Date End Date Taking? Authorizing Provider  atorvastatin (LIPITOR) 40 MG tablet Take 40 mg by mouth daily.    Historical Provider, MD  benztropine (COGENTIN) 0.5 MG tablet Take 0.5 mg by mouth daily.    Historical Provider, MD  divalproex (DEPAKOTE) 500 MG DR tablet Take 500-1,000 mg by mouth 2 (two) times daily. Take 1 tablet in the morning and 2 tablets in the evening    Historical Provider, MD  Eszopiclone (ESZOPICLONE) 3 MG TABS Take 3 mg by mouth at bedtime. Take immediately before bedtime    Historical Provider, MD  furosemide (LASIX) 20 MG tablet Take 20 mg by mouth daily.    Historical Provider, MD  levothyroxine (SYNTHROID, LEVOTHROID) 150 MCG tablet Take 150 mcg by mouth daily.    Historical Provider, MD  metFORMIN  (GLUCOPHAGE) 500 MG tablet Take 500 mg by mouth 2 (two) times daily with a meal.    Historical Provider, MD  nystatin cream (MYCOSTATIN) Apply 1 application topically 3 (three) times daily as needed. For groin area    Historical Provider, MD  OVER THE COUNTER MEDICATION Take 200 mg by mouth daily. Sodium Chloride    Historical Provider, MD  Paliperidone Palmitate (INVEGA SUSTENNA) 156 MG/ML SUSP Inject 156 mg into the muscle every 30 (thirty) days.    Historical Provider, MD  potassium chloride (K-DUR) 10 MEQ tablet Take 10 mEq by mouth daily.    Historical Provider, MD  sertraline (ZOLOFT) 100 MG tablet Take 100 mg by mouth every morning.    Historical Provider, MD     All other systems have been reviewed and were otherwise negative with the exception of those mentioned in the HPI and as above.  Physical Exam: There were no vitals filed for this visit.  General: Alert, no acute distress Cardiovascular: No pedal edema Respiratory: No cyanosis, no use of accessory musculature GI: No organomegaly, abdomen is soft and non-tender Skin: No lesions in the area of chief complaint Neurologic: Sensation intact distally Psychiatric: Patient is competent for consent with normal mood  and affect Lymphatic: No axillary or cervical lymphadenopathy  MUSCULOSKELETAL: + SLR on left  Assessment/Plan: Low back pain, left leg pain Plan for Procedure(s): LUMBAR LAMINECTOMY/DECOMPRESSION MICRODISCECTOMY   Emilee Hero, MD 07/17/2011 6:22 AM

## 2011-07-18 ENCOUNTER — Encounter (HOSPITAL_COMMUNITY): Payer: Self-pay | Admitting: Orthopedic Surgery

## 2011-07-18 LAB — GLUCOSE, CAPILLARY
Glucose-Capillary: 160 mg/dL — ABNORMAL HIGH (ref 70–99)
Glucose-Capillary: 85 mg/dL (ref 70–99)

## 2011-07-18 MED ORDER — SODIUM CHLORIDE 0.9 % IV BOLUS (SEPSIS)
1000.0000 mL | Freq: Once | INTRAVENOUS | Status: AC
  Administered 2011-07-18: 1000 mL via INTRAVENOUS

## 2011-07-18 NOTE — Progress Notes (Signed)
UR COMPLETED  

## 2011-07-18 NOTE — Discharge Summary (Signed)
NAME:  Kelsey Hanna, Kelsey Hanna             ACCOUNT NO.:  [id number]  MEDICAL RECORD NO.:  [id number]  LOCATION:  5015                         FACILITY:  [facility]  PHYSICIAN:  Estill Bamberg, MD      DATE OF BIRTH:  May 08, 1950  DATE OF ADMISSION:  07/17/2011 DATE OF DISCHARGE:  07/18/2011                              DISCHARGE SUMMARY   PREOPERATIVE DIAGNOSIS:  Left-sided L5 radiculopathy secondary to left- sided L4-5 disk extrusion, compressing the traversing L5 nerve on the left side.  DISCHARGE DIAGNOSIS:  Left-sided L5 radiculopathy secondary to left- sided L4-5 disk extrusion, compressing the traversing L5 nerve on the left side.  ADMISSION HISTORY:  Briefly, Ms. Valenta is a pleasant 61 year old female, who initially presented to me with severe pain in the left leg. I did review an MRI, which was clearly notable for an extruded large disk fragment which did correlate to the patient's symptomatology. Given the ongoing and long-standing pain, we did make a decision to go forward with a left-sided L5-S1 microdiskectomy procedure.  The patient was therefore admitted on Jul 17, 2011 for the procedure outlined above.  HOSPITAL COURSE:  On Jul 17, 2011, the patient was brought to surgery and underwent the procedure reflected above.  The patient tolerated the procedure well and was transferred to recovery in stable condition.  The patient was ultimately transferred to the floor and was evaluated by me on the morning of postoperative day #1.  On the morning of postop day #1, the patient's back pain was minimal.  She did note complete resolution of her previous left-sided leg pain and she was very pleased with the results.  The patient was therefore uneventfully discharged home on the morning of postoperative day #1.  DISCHARGE INSTRUCTIONS:  The patient will adhere to back precautions at all times.  She will take Percocet for pain and Valium for spasms.  She will follow up in my office in  approximately 2 weeks after her procedure for additional evaluation.     Estill Bamberg, MD     MD/MEDQ  D:  07/18/2011  T:  07/18/2011  Job:  [id number]

## 2011-07-18 NOTE — Progress Notes (Signed)
Patient reports resolved left leg pain.  States that she hasn't felt this great in "years".  BP 81/50  Pulse 74  Temp(Src) 98.2 F (36.8 C) (Oral)  Resp 18  Ht 5\' 4"  (1.626 m)  Wt 85.004 kg (187 lb 6.4 oz)  BMI 32.17 kg/m2  SpO2 100%  NVI Dressing CDI  POD #1 after L4/5 microdisc with postop hypotension  Will bolus 1 L NS and reassess vitals D/c drain Up ad lib Plan is to d/c home if BP increases to adequate level

## 2011-07-18 NOTE — Progress Notes (Signed)
Patient was discharged home with Dr. Marshell Levan own pre-printed/office instructions. Arnoldo Morale RN

## 2012-01-28 ENCOUNTER — Ambulatory Visit: Payer: Medicare Other | Admitting: Physical Therapy

## 2012-02-03 ENCOUNTER — Ambulatory Visit: Payer: Medicare Other | Attending: Pain Medicine | Admitting: Physical Therapy

## 2012-02-03 DIAGNOSIS — M25676 Stiffness of unspecified foot, not elsewhere classified: Secondary | ICD-10-CM | POA: Insufficient documentation

## 2012-02-03 DIAGNOSIS — M25673 Stiffness of unspecified ankle, not elsewhere classified: Secondary | ICD-10-CM | POA: Insufficient documentation

## 2012-02-03 DIAGNOSIS — M545 Low back pain, unspecified: Secondary | ICD-10-CM | POA: Insufficient documentation

## 2012-02-03 DIAGNOSIS — M25579 Pain in unspecified ankle and joints of unspecified foot: Secondary | ICD-10-CM | POA: Insufficient documentation

## 2012-02-03 DIAGNOSIS — IMO0001 Reserved for inherently not codable concepts without codable children: Secondary | ICD-10-CM | POA: Insufficient documentation

## 2012-11-14 ENCOUNTER — Emergency Department (HOSPITAL_COMMUNITY)
Admission: EM | Admit: 2012-11-14 | Discharge: 2012-11-14 | Disposition: A | Discharge status: Home / Self Care | Payer: PRIVATE HEALTH INSURANCE | Attending: Emergency Medicine | Admitting: Emergency Medicine

## 2012-11-14 ENCOUNTER — Emergency Department (HOSPITAL_COMMUNITY): Payer: PRIVATE HEALTH INSURANCE

## 2012-11-14 ENCOUNTER — Encounter (HOSPITAL_COMMUNITY): Payer: Self-pay | Admitting: *Deleted

## 2012-11-14 DIAGNOSIS — Y9301 Activity, walking, marching and hiking: Secondary | ICD-10-CM | POA: Diagnosis not present

## 2012-11-14 DIAGNOSIS — E785 Hyperlipidemia, unspecified: Secondary | ICD-10-CM | POA: Diagnosis not present

## 2012-11-14 DIAGNOSIS — F319 Bipolar disorder, unspecified: Secondary | ICD-10-CM | POA: Diagnosis not present

## 2012-11-14 DIAGNOSIS — Z79899 Other long term (current) drug therapy: Secondary | ICD-10-CM | POA: Insufficient documentation

## 2012-11-14 DIAGNOSIS — E119 Type 2 diabetes mellitus without complications: Secondary | ICD-10-CM | POA: Diagnosis not present

## 2012-11-14 DIAGNOSIS — Y9289 Other specified places as the place of occurrence of the external cause: Secondary | ICD-10-CM | POA: Insufficient documentation

## 2012-11-14 DIAGNOSIS — Z8542 Personal history of malignant neoplasm of other parts of uterus: Secondary | ICD-10-CM | POA: Diagnosis not present

## 2012-11-14 DIAGNOSIS — G8929 Other chronic pain: Secondary | ICD-10-CM | POA: Insufficient documentation

## 2012-11-14 DIAGNOSIS — E039 Hypothyroidism, unspecified: Secondary | ICD-10-CM | POA: Diagnosis not present

## 2012-11-14 DIAGNOSIS — S8990XA Unspecified injury of unspecified lower leg, initial encounter: Secondary | ICD-10-CM | POA: Diagnosis present

## 2012-11-14 DIAGNOSIS — Z87891 Personal history of nicotine dependence: Secondary | ICD-10-CM | POA: Insufficient documentation

## 2012-11-14 DIAGNOSIS — S93409A Sprain of unspecified ligament of unspecified ankle, initial encounter: Secondary | ICD-10-CM | POA: Insufficient documentation

## 2012-11-14 DIAGNOSIS — X500XXA Overexertion from strenuous movement or load, initial encounter: Secondary | ICD-10-CM | POA: Insufficient documentation

## 2012-11-14 DIAGNOSIS — S93401A Sprain of unspecified ligament of right ankle, initial encounter: Secondary | ICD-10-CM

## 2012-11-14 MED ORDER — NAPROXEN 500 MG PO TABS: 500.0000 mg | ORAL_TABLET | Freq: Two times a day (BID) | ORAL | Status: DC

## 2012-11-14 NOTE — ED Provider Notes (Signed)
Medical screening examination/treatment/procedure(s) were performed by non-physician practitioner and as supervising physician I was immediately available for consultation/collaboration.  Sunnie Nielsen, MD 11/14/12 [phone]

## 2012-11-14 NOTE — ED Notes (Signed)
Pt twisted right ankle, On Friday, pt also wrapped in ace bandage

## 2012-11-14 NOTE — ED Provider Notes (Signed)
CSN: [id number]     Arrival date & time 11/14/12  0108 History   First MD Initiated Contact with Patient 11/14/12 0117     Chief Complaint  Patient presents with  . Ankle Injury   HPI  History provided to patient. Patient is a 62 year old female with history of diabetes, bipolar disorder, left retinal detachment who presents with complaints of right ankle and foot pain and injury. Patient states that she was walking outside her to transport vehicle for her regular appointments in while going over the knee grass twisted and injured her right ankle and foot. This did not cause a complete fall to the ground. She was able to regain her balance but has had significant pain and discomfort since that time. Patient is on chronic pain medication and normally uses oxycodone 10 mg which she last took at 8 PM last evening. This seemed to help some but she is continued to be concerned of pain in her ankle and foot whenever she moves or bears weight. She has not used any additional treatments for the symptoms. Denies any weakness or numbness in the foot or toes. No other aggravating or alleviating factors. No other associated symptoms.     Past Medical History  Diagnosis Date  . Bipolar 1 disorder   . Hypothyroid   . Dyslipidemia   . Retinal detachment   . Sleep apnea   . Diabetes mellitus     dx within last yr....just takes pills  . Cancer     cancer of uterus...no chemo or radiation   Past Surgical History  Procedure Laterality Date  . Abdominal hysterectomy    . Total abdominal hysterectomy w/ bilateral salpingoophorectomy    . Tonsillectomy    . Lumbar laminectomy/decompression microdiscectomy  07/17/2011    Procedure: LUMBAR LAMINECTOMY/DECOMPRESSION MICRODISCECTOMY;  Surgeon: Emilee Hero, MD;  Location: [facility] OR;  Service: Orthopedics;  Laterality: Left;  Left sided lumbar 4-5 microdisectomy   History reviewed. No pertinent family history. History  Substance Use Topics  . Smoking  status: Former Smoker -- 2.00 packs/day for 35 years    Types: Cigarettes    Quit date: 07/09/1999  . Smokeless tobacco: Not on file  . Alcohol Use: Yes     Comment: socially   OB History   Grav Para Term Preterm Abortions TAB SAB Ect Mult Living                 Review of Systems  Neurological: Negative for weakness and numbness.  All other systems reviewed and are negative.    Allergies  Lithium  Home Medications   Current Outpatient Rx  Name  Route  Sig  Dispense  Refill  . atorvastatin (LIPITOR) 40 MG tablet   Oral   Take 40 mg by mouth every evening.          . benztropine (COGENTIN) 0.5 MG tablet   Oral   Take 0.5 mg by mouth every morning.          . cyclobenzaprine (FLEXERIL) 10 MG tablet   Oral   Take 10 mg by mouth 3 (three) times daily as needed for muscle spasms.         . divalproex (DEPAKOTE) 500 MG DR tablet   Oral   Take 500-1,000 mg by mouth 2 (two) times daily. Take 1 tablet in the morning and 2 tablets in the evening         . Eszopiclone (ESZOPICLONE) 3 MG TABS   Oral  Take 3 mg by mouth at bedtime as needed (sleep). Take immediately before bedtime         . furosemide (LASIX) 40 MG tablet   Oral   Take 40 mg by mouth 2 (two) times daily.         Marland Kitchen gabapentin (NEURONTIN) 300 MG capsule   Oral   Take 300 mg by mouth 3 (three) times daily.         . hydrochlorothiazide (HYDRODIURIL) 25 MG tablet   Oral   Take 25 mg by mouth every morning.         Marland Kitchen levothyroxine (SYNTHROID, LEVOTHROID) 150 MCG tablet   Oral   Take 150 mcg by mouth every morning.          . metFORMIN (GLUCOPHAGE) 500 MG tablet   Oral   Take 500 mg by mouth 2 (two) times daily with a meal.         . oxyCODONE-acetaminophen (PERCOCET) 10-325 MG per tablet   Oral   Take 1 tablet by mouth every 6 (six) hours as needed for pain.         . Paliperidone Palmitate (INVEGA SUSTENNA) 156 MG/ML SUSP   Intramuscular   Inject 156 mg into the muscle every  30 (thirty) days.         . potassium chloride (K-DUR) 10 MEQ tablet   Oral   Take 10 mEq by mouth every morning.           BP 129/84  Pulse 84  Temp(Src) 98.7 F (37.1 C) (Oral)  Resp 18  SpO2 100% Physical Exam  Nursing note and vitals reviewed. Constitutional: She is oriented to person, place, and time. She appears well-developed and well-nourished. No distress.  HENT:  Head: Normocephalic.  Eyes: Conjunctivae are normal.  Cardiovascular: Normal rate and regular rhythm.   Pulmonary/Chest: Effort normal and breath sounds normal. No respiratory distress. She has no wheezes.  Musculoskeletal: She exhibits edema.  Reduced range of motion of the right ankle secondary to pain and comfort.  Mild swelling of the right dorsal foot and ankle area. There is no significant tenderness over the lateral or medial malleolus. The proximal fifth metatarsal tenderness. Normal dorsal pedal pulses sensations to light touch and capillary refill.  Neurological: She is alert and oriented to person, place, and time.  Skin: Skin is warm and dry. No rash noted.  Psychiatric: She has a normal mood and affect. Her behavior is normal.    ED Course  Procedures    Imaging Review Dg Ankle Complete Right  11/14/2012   *RADIOLOGY REPORT*  Clinical Data: Pain  RIGHT ANKLE - COMPLETE 3+ VIEW  Comparison: None available at time of study interpretation.  Findings: No acute fracture deformity.  Ankle mortise appears congruent and tibiofibular syndesmosis intact.  No suspicious bony lesions. Soft tissue swelling without subcutaneous gas nor radiopaque foreign bodies.  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  IMPRESSION: No acute fracture deformity or dislocation.  Soft tissue swelling.   Original Report Authenticated By: Awilda Metro   Dg Foot Complete Right  11/14/2012   *RADIOLOGY REPORT*  Clinical Data: Pain at heel.  RIGHT FOOT COMPLETE - 3+ VIEW  Comparison: None  available at time of study interpretation.  Findings: Status post first metatarsal head osteotomy with wire fixation consistent with bunionectomy.  Postsurgical changes of the fifth ray.  No acute fracture deformity dislocation.  No destructive bony lesions.  Soft tissue swelling without subcutaneous  gas nor radiopaque foreign bodies.  IMPRESSION: No acute fracture deformity or dislocation.  Status post remote bunionectomy and postsurgical changes of the fifth ray.   Original Report Authenticated By: Awilda Metro    MDM   1. Ankle sprain, right, initial encounter       1:25 AM patient seen and evaluated. The patient resting in bed appears comfortable no acute distress. Mild swelling around the foot and ankle without gross deformity. Normal dorsal pedal pulses sensations. Plan to obtain x-rays apply ice and treat pain.  X-rays reviewed. No signs of broken bones other concerning injuries. I discussed findings with the patient. She has been instructed to continue RICE therapy. She will followup with her PCP for continued evaluation and treatment.  Angus Seller, PA-C 11/14/12 [phone]

## 2012-11-16 ENCOUNTER — Telehealth (HOSPITAL_COMMUNITY): Payer: Self-pay | Admitting: Emergency Medicine

## 2012-11-16 NOTE — ED Notes (Signed)
Pt called AVS summary given to pharmacy when she went to have prescriptions filled and left behind.  Pt wanting copy of AVS faxed to her.  AVS faxed to pts attn to [phone]

## 2013-07-20 ENCOUNTER — Other Ambulatory Visit: Payer: Self-pay | Admitting: Pain Medicine

## 2013-07-20 DIAGNOSIS — M545 Low back pain, unspecified: Secondary | ICD-10-CM

## 2013-07-28 ENCOUNTER — Ambulatory Visit
Admission: RE | Admit: 2013-07-28 | Discharge: 2013-07-28 | Disposition: A | Discharge status: Home / Self Care | Payer: Medicare Other | Source: Ambulatory Visit | Attending: Pain Medicine | Admitting: Pain Medicine

## 2013-07-28 DIAGNOSIS — M545 Low back pain, unspecified: Secondary | ICD-10-CM

## 2013-07-28 MED ORDER — GADOBENATE DIMEGLUMINE 529 MG/ML IV SOLN
17.0000 mL | Freq: Once | INTRAVENOUS | Status: AC | PRN
  Administered 2013-07-28: 17 mL via INTRAVENOUS

## 2013-08-14 ENCOUNTER — Emergency Department (HOSPITAL_COMMUNITY)
Admission: EM | Admit: 2013-08-14 | Discharge: 2013-08-14 | Disposition: A | Discharge status: Home / Self Care | Payer: Medicare Other | Attending: Emergency Medicine | Admitting: Emergency Medicine

## 2013-08-14 ENCOUNTER — Emergency Department (HOSPITAL_COMMUNITY): Payer: Medicare Other

## 2013-08-14 ENCOUNTER — Encounter (HOSPITAL_COMMUNITY): Payer: Self-pay | Admitting: Emergency Medicine

## 2013-08-14 DIAGNOSIS — Z79899 Other long term (current) drug therapy: Secondary | ICD-10-CM | POA: Insufficient documentation

## 2013-08-14 DIAGNOSIS — Z791 Long term (current) use of non-steroidal anti-inflammatories (NSAID): Secondary | ICD-10-CM | POA: Insufficient documentation

## 2013-08-14 DIAGNOSIS — Z8542 Personal history of malignant neoplasm of other parts of uterus: Secondary | ICD-10-CM | POA: Diagnosis not present

## 2013-08-14 DIAGNOSIS — E785 Hyperlipidemia, unspecified: Secondary | ICD-10-CM | POA: Diagnosis not present

## 2013-08-14 DIAGNOSIS — E039 Hypothyroidism, unspecified: Secondary | ICD-10-CM | POA: Diagnosis not present

## 2013-08-14 DIAGNOSIS — F319 Bipolar disorder, unspecified: Secondary | ICD-10-CM | POA: Diagnosis not present

## 2013-08-14 DIAGNOSIS — M109 Gout, unspecified: Secondary | ICD-10-CM | POA: Insufficient documentation

## 2013-08-14 DIAGNOSIS — Z87891 Personal history of nicotine dependence: Secondary | ICD-10-CM | POA: Diagnosis not present

## 2013-08-14 DIAGNOSIS — Z8669 Personal history of other diseases of the nervous system and sense organs: Secondary | ICD-10-CM | POA: Diagnosis not present

## 2013-08-14 DIAGNOSIS — M79609 Pain in unspecified limb: Secondary | ICD-10-CM

## 2013-08-14 DIAGNOSIS — M25579 Pain in unspecified ankle and joints of unspecified foot: Secondary | ICD-10-CM | POA: Diagnosis present

## 2013-08-14 DIAGNOSIS — E119 Type 2 diabetes mellitus without complications: Secondary | ICD-10-CM | POA: Diagnosis not present

## 2013-08-14 DIAGNOSIS — M7989 Other specified soft tissue disorders: Secondary | ICD-10-CM

## 2013-08-14 LAB — I-STAT CHEM 8, ED
BUN: 12 mg/dL (ref 6–23)
CALCIUM ION: 1.12 mmol/L — AB (ref 1.13–1.30)
CREATININE: 0.8 mg/dL (ref 0.50–1.10)
Chloride: 94 mEq/L — ABNORMAL LOW (ref 96–112)
GLUCOSE: 111 mg/dL — AB (ref 70–99)
HEMATOCRIT: 47 % — AB (ref 36.0–46.0)
HEMOGLOBIN: 16 g/dL — AB (ref 12.0–15.0)
Potassium: 3.7 mEq/L (ref 3.7–5.3)
Sodium: 137 mEq/L (ref 137–147)
TCO2: 31 mmol/L (ref 0–100)

## 2013-08-14 LAB — URIC ACID: Uric Acid, Serum: 11.9 mg/dL — ABNORMAL HIGH (ref 2.4–7.0)

## 2013-08-14 MED ORDER — NAPROXEN 500 MG PO TABS: 500.0000 mg | ORAL_TABLET | Freq: Two times a day (BID) | ORAL | Status: DC

## 2013-08-14 NOTE — ED Provider Notes (Signed)
CSN: [id number]     Arrival date & time 08/14/13  1431 History   First MD Initiated Contact with Patient 08/14/13 1433     Chief Complaint  Patient presents with  . Ankle Pain     Patient is a 63 y.o. female presenting with ankle pain. The history is provided by the patient. No language interpreter was used.  Ankle Pain Location:  Ankle and foot Time since incident:  4 days Injury: no   Ankle location:  R ankle Foot location:  R foot and dorsum of R foot Pain details:    Quality:  Aching, burning and sharp   Radiates to:  Does not radiate   Severity:  Severe   Onset quality:  Gradual   Duration:  5 days   Timing:  Constant   Progression:  Worsening Chronicity:  New Dislocation: no   Foreign body present:  No foreign bodies Tetanus status:  Up to date Prior injury to area:  Yes Relieved by:  Compression, ice, heat, movement, rest and elevation Worsened by:  Bearing weight, exercise, activity, flexion, extension and rotation Ineffective treatments:  Compression, elevation, heat, ice, immobilization, movement and rest Associated symptoms: decreased ROM, stiffness and swelling   Associated symptoms: no back pain, no fatigue, no fever, no itching, no muscle weakness, no neck pain, no numbness and no tingling   Risk factors: obesity   Risk factors: no concern for non-accidental trauma, no frequent fractures, no known bone disorder and no recent illness     Past Medical History  Diagnosis Date  . Bipolar 1 disorder   . Hypothyroid   . Dyslipidemia   . Retinal detachment   . Sleep apnea   . Diabetes mellitus     dx within last yr....just takes pills  . Cancer     cancer of uterus...no chemo or radiation   Past Surgical History  Procedure Laterality Date  . Abdominal hysterectomy    . Total abdominal hysterectomy w/ bilateral salpingoophorectomy    . Tonsillectomy    . Lumbar laminectomy/decompression microdiscectomy  07/17/2011    Procedure: LUMBAR  LAMINECTOMY/DECOMPRESSION MICRODISCECTOMY;  Surgeon: Sinclair Ship, MD;  Location: [place];  Service: Orthopedics;  Laterality: Left;  Left sided lumbar 4-5 microdisectomy   History reviewed. No pertinent family history. History  Substance Use Topics  . Smoking status: Former Smoker -- 2.00 packs/day for 35 years    Types: Cigarettes    Quit date: 07/09/1999  . Smokeless tobacco: Not on file  . Alcohol Use: No     Comment: socially   OB History   Grav Para Term Preterm Abortions TAB SAB Ect Mult Living                 Review of Systems  Constitutional: Negative for fever, chills, diaphoresis and fatigue.  Respiratory: Negative for chest tightness and shortness of breath.   Cardiovascular: Negative for chest pain, palpitations and leg swelling.  Musculoskeletal: Positive for arthralgias, gait problem, joint swelling and stiffness. Negative for back pain, myalgias and neck pain.  Skin: Negative for itching.  All other systems reviewed and are negative.     Allergies  Lithium  Home Medications   Prior to Admission medications   Medication Sig Start Date End Date Taking? Authorizing Chaley Castellanos  atorvastatin (LIPITOR) 40 MG tablet Take 40 mg by mouth every evening.    Yes Historical Marilea Gwynne, MD  cyclobenzaprine (FLEXERIL) 10 MG tablet Take 10 mg by mouth 3 (three) times daily as needed  for muscle spasms.   Yes Historical Camille Thau, MD  divalproex (DEPAKOTE) 500 MG DR tablet Take 500-1,000 mg by mouth 2 (two) times daily. Take 1 tablet in the morning and 2 tablets in the evening   Yes Historical Richie Vadala, MD  fentaNYL (DURAGESIC - DOSED MCG/HR) 25 MCG/HR patch Place 1 patch onto the skin every other day.  08/10/13  Yes Historical Garv Kuechle, MD  furosemide (LASIX) 80 MG tablet Take 80 mg by mouth daily.   Yes Historical Endiya Klahr, MD  gabapentin (NEURONTIN) 300 MG capsule Take 300 mg by mouth 3 (three) times daily.   Yes Historical Shelbee Apgar, MD  hydrochlorothiazide (HYDRODIURIL) 25  MG tablet Take 25 mg by mouth every morning.   Yes Historical Quill Grinder, MD  levothyroxine (SYNTHROID, LEVOTHROID) 175 MCG tablet Take 175 mcg by mouth daily before breakfast.   Yes Historical Amrutha Avera, MD  lidocaine (XYLOCAINE) 5 % ointment Apply 1 application topically daily. 06/29/13  Yes Historical Johntavious Francom, MD  metFORMIN (GLUCOPHAGE) 500 MG tablet Take 500 mg by mouth 2 (two) times daily with a meal.   Yes Historical Zunaira Lamy, MD  oxyCODONE-acetaminophen (PERCOCET) 10-325 MG per tablet Take 1 tablet by mouth every 6 (six) hours as needed for pain.   Yes Historical Damyn Weitzel, MD  potassium chloride (K-DUR) 10 MEQ tablet Take 10 mEq by mouth every morning.    Yes Historical Fabiana Dromgoole, MD  VOLTAREN 1 % GEL Apply 1 application topically daily. 06/29/13  Yes Historical Avary Eichenberger, MD  zolpidem (AMBIEN) 10 MG tablet Take 10 mg by mouth at bedtime as needed for sleep.   Yes Historical Lakeyn Dokken, MD  naproxen (NAPROSYN) 500 MG tablet Take 1 tablet (500 mg total) by mouth 2 (two) times daily with a meal. 08/14/13   Courtney A Forucci, PA-C   BP 100/46  Pulse 92  Temp(Src) 99.1 F (37.3 C) (Oral)  Resp 16  SpO2 94% Physical Exam  Nursing note and vitals reviewed. Constitutional: She appears well-developed and well-nourished. No distress.  HENT:  Head: Normocephalic and atraumatic.  Mouth/Throat: Oropharynx is clear and moist. No oropharyngeal exudate.  Eyes: No scleral icterus.  Left conjunctiva is opaque  Neck: Normal range of motion. Neck supple. No thyromegaly present.  Cardiovascular: Normal rate, regular rhythm, normal heart sounds and intact distal pulses.  Exam reveals no gallop and no friction rub.   No murmur heard. Pulmonary/Chest: Effort normal and breath sounds normal. No respiratory distress. She has no wheezes. She has no rales. She exhibits no tenderness.  Musculoskeletal:       Right ankle: She exhibits decreased range of motion and swelling. She exhibits no ecchymosis, no deformity  and normal pulse. Tenderness. No lateral malleolus, no medial malleolus, no AITFL, no CF ligament, no posterior TFL, no head of 5th metatarsal and no proximal fibula tenderness found. Achilles tendon exhibits normal Thompson's test results.  Right dorsum of the foot is edematous with minimal erythema.  Mildly warm to touch.  Patient has tenderness to palpation over the right 1st MTP joint, and pain with the anterior drawer.  No increased joint laxity.  Full passive ROM is demonstrated with pain.    Lymphadenopathy:    She has no cervical adenopathy.  Skin: Skin is warm and dry. She is not diaphoretic.  Psychiatric: She has a normal mood and affect. Her behavior is normal. Judgment and thought content normal.    ED Course  Procedures (including critical care time) Labs Review Labs Reviewed  URIC ACID - Abnormal; Notable for the following:  Uric Acid, Serum 11.9 (*)    All other components within normal limits  I-STAT CHEM 8, ED - Abnormal; Notable for the following:    Chloride 94 (*)    Glucose, Bld 111 (*)    Calcium, Ion 1.12 (*)    Hemoglobin 16.0 (*)    HCT 47.0 (*)    All other components within normal limits    Imaging Review Dg Ankle Complete Right  08/14/2013   CLINICAL DATA:  Diffuse right ankle pain and swelling for 5 days.  EXAM: RIGHT ANKLE - COMPLETE 3+ VIEW  COMPARISON:  11/14/2012  FINDINGS: There is moderate, diffuse soft tissue swelling about the ankle, less than on the prior study. Small sclerotic focus in the distal tibia is unchanged, likely a bone island. There is no evidence of acute fracture or dislocation. Mild-to-moderate tibiotalar spurring is unchanged. Fixation pin is noted in the first metatarsal.  IMPRESSION: Soft tissue swelling without acute osseous abnormality identified.   Electronically Signed   By: Logan Bores   On: 08/14/2013 16:10     EKG Interpretation None      MDM   Final diagnoses:  Acute gout of right ankle, unspecified cause    Patient presents to the ED for right ankle and leg pain x 5 days.  Physical exam shows an edematous right ankle.  Patient is afebrile here in the ED.  There is no history of injury to the ankle at this time.  Plain film x ray shows no acute bony abnormality.  Venous doppler of the leg which was ordered due to calf tenderness is also negative for DVT.  Patient does have an elevated Uric acid level at this time.  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  SCr at this time is 0.8.  Will treat with naproxen BID with food.  Patient is already taking percocet daily for another condition so I will not prescribe a narcotic at this time.  I have placed her in a CAM walker at this time.  Patient will follow-up with her PCP this week to check for clearing.  Doubt septic joint at this time.  Patient agrees to the above plan.  She was given strict return precautions of fever and a hot red warm joint.  She states understanding.      Kenard Gower, PA-C 08/14/13 1825

## 2013-08-14 NOTE — ED Notes (Addendum)
Ortho tech paged  

## 2013-08-14 NOTE — Discharge Instructions (Signed)

## 2013-08-14 NOTE — ED Notes (Signed)
Patient returned from radiology

## 2013-08-14 NOTE — Progress Notes (Signed)
VASCULAR LAB PRELIMINARY  PRELIMINARY  PRELIMINARY  PRELIMINARY  Right lower extremity venous Doppler completed.    Preliminary report:  There is no DVT or SVT noted in the right lower extremity.  Capri Raben, RVT 08/14/2013, 4:07 PM

## 2013-08-14 NOTE — ED Notes (Signed)
Right ankle pain shooting up right leg starting on Wednesday. Has tried ice, heat, ankle wrapping, and elevation, but pain increasing by the day and swelling increasing by the day.

## 2013-08-14 NOTE — Progress Notes (Signed)
Orthopedic Tech Progress Note Patient Details:  JANAL HAAK 1950/08/21 [id number]  Ortho Devices Type of Ortho Device: CAM walker Ortho Device/Splint Location: RLE Ortho Device/Splint Interventions: Ordered;Application   Braulio Bosch 08/14/2013, 5:54 PM

## 2013-08-14 NOTE — ED Notes (Signed)
Patient transported to X-ray 

## 2013-08-16 NOTE — ED Provider Notes (Signed)
Medical screening examination/treatment/procedure(s) were performed by non-physician practitioner and as supervising physician I was immediately available for consultation/collaboration.   EKG Interpretation None       Virgel Manifold, MD 08/16/13 [phone]

## 2013-08-25 ENCOUNTER — Emergency Department (HOSPITAL_COMMUNITY): Payer: Medicare Other

## 2013-08-25 ENCOUNTER — Emergency Department (HOSPITAL_COMMUNITY)
Admission: EM | Admit: 2013-08-25 | Discharge: 2013-08-25 | Disposition: A | Discharge status: Home / Self Care | Payer: Medicare Other | Attending: Emergency Medicine | Admitting: Emergency Medicine

## 2013-08-25 ENCOUNTER — Encounter (HOSPITAL_COMMUNITY): Payer: Self-pay | Admitting: Emergency Medicine

## 2013-08-25 DIAGNOSIS — E860 Dehydration: Secondary | ICD-10-CM

## 2013-08-25 DIAGNOSIS — G4730 Sleep apnea, unspecified: Secondary | ICD-10-CM | POA: Insufficient documentation

## 2013-08-25 DIAGNOSIS — E039 Hypothyroidism, unspecified: Secondary | ICD-10-CM | POA: Insufficient documentation

## 2013-08-25 DIAGNOSIS — Z8669 Personal history of other diseases of the nervous system and sense organs: Secondary | ICD-10-CM | POA: Diagnosis not present

## 2013-08-25 DIAGNOSIS — F319 Bipolar disorder, unspecified: Secondary | ICD-10-CM | POA: Diagnosis not present

## 2013-08-25 DIAGNOSIS — R4182 Altered mental status, unspecified: Secondary | ICD-10-CM | POA: Diagnosis present

## 2013-08-25 DIAGNOSIS — Z87891 Personal history of nicotine dependence: Secondary | ICD-10-CM | POA: Insufficient documentation

## 2013-08-25 DIAGNOSIS — E785 Hyperlipidemia, unspecified: Secondary | ICD-10-CM | POA: Diagnosis not present

## 2013-08-25 DIAGNOSIS — Z791 Long term (current) use of non-steroidal anti-inflammatories (NSAID): Secondary | ICD-10-CM | POA: Insufficient documentation

## 2013-08-25 DIAGNOSIS — Z79899 Other long term (current) drug therapy: Secondary | ICD-10-CM | POA: Insufficient documentation

## 2013-08-25 DIAGNOSIS — E876 Hypokalemia: Secondary | ICD-10-CM | POA: Diagnosis not present

## 2013-08-25 DIAGNOSIS — Z8542 Personal history of malignant neoplasm of other parts of uterus: Secondary | ICD-10-CM | POA: Diagnosis not present

## 2013-08-25 DIAGNOSIS — E119 Type 2 diabetes mellitus without complications: Secondary | ICD-10-CM | POA: Insufficient documentation

## 2013-08-25 DIAGNOSIS — M109 Gout, unspecified: Secondary | ICD-10-CM | POA: Insufficient documentation

## 2013-08-25 LAB — CBC WITH DIFFERENTIAL/PLATELET
BASOS ABS: 0 10*3/uL (ref 0.0–0.1)
BASOS PCT: 0 % (ref 0–1)
EOS ABS: 0.1 10*3/uL (ref 0.0–0.7)
Eosinophils Relative: 0 % (ref 0–5)
HCT: 36.8 % (ref 36.0–46.0)
Hemoglobin: 12.1 g/dL (ref 12.0–15.0)
Lymphocytes Relative: 19 % (ref 12–46)
Lymphs Abs: 2.4 10*3/uL (ref 0.7–4.0)
MCH: 29.7 pg (ref 26.0–34.0)
MCHC: 32.9 g/dL (ref 30.0–36.0)
MCV: 90.2 fL (ref 78.0–100.0)
Monocytes Absolute: 0.9 10*3/uL (ref 0.1–1.0)
Monocytes Relative: 7 % (ref 3–12)
NEUTROS ABS: 9.3 10*3/uL — AB (ref 1.7–7.7)
Neutrophils Relative %: 74 % (ref 43–77)
Platelets: 231 10*3/uL (ref 150–400)
RBC: 4.08 MIL/uL (ref 3.87–5.11)
RDW: 13.8 % (ref 11.5–15.5)
WBC: 12.6 10*3/uL — ABNORMAL HIGH (ref 4.0–10.5)

## 2013-08-25 LAB — ETHANOL

## 2013-08-25 LAB — COMPREHENSIVE METABOLIC PANEL
ALBUMIN: 3.9 g/dL (ref 3.5–5.2)
ALT: 25 U/L (ref 0–35)
ANION GAP: 17 — AB (ref 5–15)
AST: 26 U/L (ref 0–37)
Alkaline Phosphatase: 68 U/L (ref 39–117)
BILIRUBIN TOTAL: 0.2 mg/dL — AB (ref 0.3–1.2)
BUN: 31 mg/dL — AB (ref 6–23)
CALCIUM: 9.4 mg/dL (ref 8.4–10.5)
CHLORIDE: 91 meq/L — AB (ref 96–112)
CO2: 29 mEq/L (ref 19–32)
Creatinine, Ser: 0.92 mg/dL (ref 0.50–1.10)
GFR calc Af Amer: 75 mL/min — ABNORMAL LOW (ref >90)
GFR calc non Af Amer: 64 mL/min — ABNORMAL LOW (ref >90)
Glucose, Bld: 118 mg/dL — ABNORMAL HIGH (ref 70–99)
Potassium: 3.4 mEq/L — ABNORMAL LOW (ref 3.7–5.3)
Sodium: 137 mEq/L (ref 137–147)
Total Protein: 7.2 g/dL (ref 6.0–8.3)

## 2013-08-25 LAB — URINALYSIS, ROUTINE W REFLEX MICROSCOPIC
Bilirubin Urine: NEGATIVE
GLUCOSE, UA: NEGATIVE mg/dL
Hgb urine dipstick: NEGATIVE
Ketones, ur: NEGATIVE mg/dL
LEUKOCYTES UA: NEGATIVE
Nitrite: NEGATIVE
PH: 7 (ref 5.0–8.0)
Protein, ur: NEGATIVE mg/dL
Specific Gravity, Urine: 1.013 (ref 1.005–1.030)
Urobilinogen, UA: 0.2 mg/dL (ref 0.0–1.0)

## 2013-08-25 LAB — RAPID URINE DRUG SCREEN, HOSP PERFORMED
AMPHETAMINES: NOT DETECTED
Barbiturates: NOT DETECTED
Benzodiazepines: NOT DETECTED
COCAINE: NOT DETECTED
OPIATES: NOT DETECTED
Tetrahydrocannabinol: NOT DETECTED

## 2013-08-25 LAB — SALICYLATE LEVEL: Salicylate Lvl: <2 mg/dL — ABNORMAL LOW (ref 2.8–20.0)

## 2013-08-25 LAB — ACETAMINOPHEN LEVEL: Acetaminophen (Tylenol), Serum: <15 ug/mL (ref 10–30)

## 2013-08-25 MED ORDER — SODIUM CHLORIDE 0.9 % IV BOLUS (SEPSIS)
1000.0000 mL | Freq: Once | INTRAVENOUS | Status: AC
  Administered 2013-08-25: 1000 mL via INTRAVENOUS

## 2013-08-25 MED ORDER — POTASSIUM CHLORIDE CRYS ER 20 MEQ PO TBCR: 20.0000 meq | EXTENDED_RELEASE_TABLET | Freq: Two times a day (BID) | ORAL | Status: DC

## 2013-08-25 MED ORDER — POTASSIUM CHLORIDE CRYS ER 20 MEQ PO TBCR
40.0000 meq | EXTENDED_RELEASE_TABLET | Freq: Once | ORAL | Status: AC
  Administered 2013-08-25: 40 meq via ORAL
  Filled 2013-08-25: qty 2

## 2013-08-25 NOTE — ED Notes (Signed)
Pt presents via [facility] from DR office. Dr office reported that patient was having delusional thoughts. Pt a/o x 4 at this time. NAD noted. Pt reports that she thinks she is dehydrated. Reports feeling nauseous and faint. Denies any auditory or visual hallucinations. NAD noted.

## 2013-08-25 NOTE — ED Provider Notes (Signed)
CSN: [id number]     Arrival date & time 08/25/13  1630 History   First MD Initiated Contact with Patient 08/25/13 1706     Chief Complaint  Patient presents with  . Altered Mental Status     (Consider location/radiation/quality/duration/timing/severity/associated sxs/prior Treatment) HPI Patient states she had her regular 3 month appointment today with her physician. She states she is not had anything to eat or drink today and they thought she was dehydrated. She thought she was just going to have a medication check today. However they sent her to the ED. Per EMS she was "having delusional thoughts" She states she fractured her left ankle 3 years ago and is still having problems with it. She states she sprained her right ankle couple weeks ago and also was diagnosed with gout. She states she has a dry mouth. She states she feels short of breath because her mouth is dry. After a long time in our interview she then states she felt like she was passing out the whole time during her visit at her doctor's office. She denies nausea, vomiting, diarrhea, abdominal pain, or chest pain. She does speak about a neighbor who comes to her house daily. She states they use to be friendly neighbors but she felt like a neighbor stole some money from her. She states she has to call the police every day because the neighbor comes to her door. She however states that neighbor is being evicted so will be leaving soon. Patient complaining of has to urinate on arrival to the ED but states she can't urinate.   PCP Dr Luetta Nutting  [facility]  Past Medical History  Diagnosis Date  . Bipolar 1 disorder   . Hypothyroid   . Dyslipidemia   . Retinal detachment   . Sleep apnea   . Diabetes mellitus     dx within last yr....just takes pills  . Cancer     cancer of uterus...no chemo or radiation   Past Surgical History  Procedure Laterality Date  . Abdominal hysterectomy    . Total abdominal hysterectomy w/ bilateral  salpingoophorectomy    . Tonsillectomy    . Lumbar laminectomy/decompression microdiscectomy  07/17/2011    Procedure: LUMBAR LAMINECTOMY/DECOMPRESSION MICRODISCECTOMY;  Surgeon: Sinclair Ship, MD;  Location: [place];  Service: Orthopedics;  Laterality: Left;  Left sided lumbar 4-5 microdisectomy   History reviewed. No pertinent family history. History  Substance Use Topics  . Smoking status: Former Smoker -- 2.00 packs/day for 35 years    Types: Cigarettes    Quit date: 07/09/1999  . Smokeless tobacco: Not on file  . Alcohol Use: No     Comment: socially   Lives at home Lives alone Has a CNA come 7 days a week  OB History   Grav Para Term Preterm Abortions TAB SAB Ect Mult Living                 Review of Systems  All other systems reviewed and are negative.     Allergies  Lithium  Home Medications   Prior to Admission medications   Medication Sig Start Date End Date Taking? Authorizing Provider  atorvastatin (LIPITOR) 40 MG tablet Take 40 mg by mouth every evening.    Yes Historical Provider, MD  cyclobenzaprine (FLEXERIL) 10 MG tablet Take 10 mg by mouth 3 (three) times daily.   Yes Historical Provider, MD  divalproex (DEPAKOTE) 500 MG DR tablet Take 500-1,000 mg by mouth 2 (two) times daily. Take  1 tablet in the morning and 2 tablets in the evening   Yes Historical Provider, MD  docusate sodium (COLACE) 100 MG capsule Take 100 mg by mouth daily as needed for mild constipation.   Yes Historical Provider, MD  fentaNYL (DURAGESIC - DOSED MCG/HR) 25 MCG/HR patch Place 1 patch onto the skin every other day.  08/10/13  Yes Historical Provider, MD  furosemide (LASIX) 80 MG tablet Take 80 mg by mouth daily.   Yes Historical Provider, MD  gabapentin (NEURONTIN) 300 MG capsule Take 300 mg by mouth 3 (three) times daily.   Yes Historical Provider, MD  hydrochlorothiazide (HYDRODIURIL) 25 MG tablet Take 25 mg by mouth every morning.   Yes Historical Provider, MD   levothyroxine (SYNTHROID, LEVOTHROID) 175 MCG tablet Take 175 mcg by mouth daily before breakfast.   Yes Historical Provider, MD  lidocaine (XYLOCAINE) 5 % ointment Apply 1 application topically daily. Apply to ankles and heels 06/29/13  Yes Historical Provider, MD  metFORMIN (GLUCOPHAGE) 500 MG tablet Take 500 mg by mouth 2 (two) times daily with a meal.   Yes Historical Provider, MD  naproxen (NAPROSYN) 500 MG tablet Take 1 tablet (500 mg total) by mouth 2 (two) times daily with a meal. 08/14/13  Yes Courtney A Forcucci, PA-C  oxyCODONE-acetaminophen (PERCOCET) 10-325 MG per tablet Take 1 tablet by mouth 4 (four) times daily.   Yes Historical Provider, MD  potassium chloride (K-DUR) 10 MEQ tablet Take 10 mEq by mouth every morning.    Yes Historical Provider, MD  VOLTAREN 1 % GEL Apply 1 application topically daily. 06/29/13  Yes Historical Provider, MD  zolpidem (AMBIEN) 10 MG tablet Take 10 mg by mouth at bedtime.    Yes Historical Provider, MD   BP 106/60  Pulse 94  Temp(Src) 97.6 F (36.4 C) (Oral)  Resp 16  SpO2 95%  Vital signs normal   Physical Exam  Nursing note and vitals reviewed. Constitutional: She is oriented to person, place, and time. She appears well-developed and well-nourished.  Non-toxic appearance. She does not appear ill. No distress.  HENT:  Head: Normocephalic and atraumatic.  Right Ear: External ear normal.  Left Ear: External ear normal.  Nose: Nose normal. No mucosal edema or rhinorrhea.  Mouth/Throat: Oropharynx is clear and moist and mucous membranes are normal. No dental abscesses or uvula swelling.  Eyes: Conjunctivae and EOM are normal.  Patient has disconjugate gaze, her right eye deviates laterally. 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  Neck: Normal range of motion and full passive range of motion without pain. Neck supple.  Cardiovascular: Normal rate, regular rhythm and normal heart sounds.  Exam reveals no gallop and no  friction rub.   No murmur heard. Pulmonary/Chest: Effort normal and breath sounds normal. No respiratory distress. She has no wheezes. She has no rhonchi. She has no rales. She exhibits no tenderness and no crepitus.  Abdominal: Soft. Normal appearance and bowel sounds are normal. She exhibits no distension. There is no tenderness. There is no rebound and no guarding.  Musculoskeletal: Normal range of motion. She exhibits no edema and no tenderness.  Moves all extremities well.   Neurological: She is alert and oriented to person, place, and time. She has normal strength. No cranial nerve deficit.  Skin: Skin is warm, dry and intact. No rash noted. No erythema. No pallor.  Psychiatric: She has a normal mood and affect. Her speech is normal and behavior is normal.  Her mood appears not anxious.    ED Course  Procedures (including critical care time) Medications  potassium chloride SA (K-DUR,KLOR-CON) CR tablet 40 mEq (not administered)  sodium chloride 0.9 % bolus 1,000 mL (0 mLs Intravenous Stopped 08/25/13 2059)    Pt c/o needing to urinate, demanding to have a catheter done.  Bladder scan shows 500 cc or urine. In and out cath done. Pt is very particular with how her bed is arranged and the equipment attached to her.   19:30 Dr Georges Lynch, [place], states Dr Luetta Nutting has left the [facility] program and is now in practice in [facility], [place]  Pt has had no obvious delusions, hallucinations, SI, HI, or symptoms of depression. She has been able to drink fluids and is eating and pt seems more energetic and alert. Pt is now smiling. States she is ready to go home.    Labs Review Results for orders placed during the hospital encounter of 08/25/13  CBC WITH DIFFERENTIAL      Result Value Ref Range   WBC 12.6 (*) 4.0 - 10.5 K/uL   RBC 4.08  3.87 - 5.11 MIL/uL   Hemoglobin 12.1  12.0 - 15.0 g/dL   HCT 36.8  36.0 - 46.0 %   MCV 90.2  78.0 - 100.0 fL   MCH 29.7  26.0 - 34.0 pg   MCHC 32.9  30.0 -  36.0 g/dL   RDW 13.8  11.5 - 15.5 %   Platelets 231  150 - 400 K/uL   Neutrophils Relative % 74  43 - 77 %   Neutro Abs 9.3 (*) 1.7 - 7.7 K/uL   Lymphocytes Relative 19  12 - 46 %   Lymphs Abs 2.4  0.7 - 4.0 K/uL   Monocytes Relative 7  3 - 12 %   Monocytes Absolute 0.9  0.1 - 1.0 K/uL   Eosinophils Relative 0  0 - 5 %   Eosinophils Absolute 0.1  0.0 - 0.7 K/uL   Basophils Relative 0  0 - 1 %   Basophils Absolute 0.0  0.0 - 0.1 K/uL  COMPREHENSIVE METABOLIC PANEL      Result Value Ref Range   Sodium 137  137 - 147 mEq/L   Potassium 3.4 (*) 3.7 - 5.3 mEq/L   Chloride 91 (*) 96 - 112 mEq/L   CO2 29  19 - 32 mEq/L   Glucose, Bld 118 (*) 70 - 99 mg/dL   BUN 31 (*) 6 - 23 mg/dL   Creatinine, Ser 0.92  0.50 - 1.10 mg/dL   Calcium 9.4  8.4 - 10.5 mg/dL   Total Protein 7.2  6.0 - 8.3 g/dL   Albumin 3.9  3.5 - 5.2 g/dL   AST 26  0 - 37 U/L   ALT 25  0 - 35 U/L   Alkaline Phosphatase 68  39 - 117 U/L   Total Bilirubin 0.2 (*) 0.3 - 1.2 mg/dL   GFR calc non Af Amer 64 (*) >90 mL/min   GFR calc Af Amer 75 (*) >90 mL/min   Anion gap 17 (*) 5 - 15  ETHANOL      Result Value Ref Range   Alcohol, Ethyl (B) <11  0 - 11 mg/dL  URINE RAPID DRUG SCREEN (HOSP PERFORMED)      Result Value Ref Range   Opiates NONE DETECTED  NONE DETECTED   Cocaine NONE DETECTED  NONE DETECTED   Benzodiazepines NONE DETECTED  NONE DETECTED  Amphetamines NONE DETECTED  NONE DETECTED   Tetrahydrocannabinol NONE DETECTED  NONE DETECTED   Barbiturates NONE DETECTED  NONE DETECTED  URINALYSIS, ROUTINE W REFLEX MICROSCOPIC      Result Value Ref Range   Color, Urine YELLOW  YELLOW   APPearance CLEAR  CLEAR   Specific Gravity, Urine 1.013  1.005 - 1.030   pH 7.0  5.0 - 8.0   Glucose, UA NEGATIVE  NEGATIVE mg/dL   Hgb urine dipstick NEGATIVE  NEGATIVE   Bilirubin Urine NEGATIVE  NEGATIVE   Ketones, ur NEGATIVE  NEGATIVE mg/dL   Protein, ur NEGATIVE  NEGATIVE mg/dL   Urobilinogen, UA 0.2  0.0 - 1.0 mg/dL    Nitrite NEGATIVE  NEGATIVE   Leukocytes, UA NEGATIVE  NEGATIVE  ACETAMINOPHEN LEVEL      Result Value Ref Range   Acetaminophen (Tylenol), Serum <15.0  10 - 30 ug/mL  SALICYLATE LEVEL      Result Value Ref Range   Salicylate Lvl <6.2 (*) 2.8 - 20.0 mg/dL   Laboratory interpretation all normal except for leukocytosis, elevation of BUN consistent with dehydration   Dg Ankle Complete Right  08/14/2013   CLINICAL DATA:  Diffuse right ankle pain and swelling for 5 days.   IMPRESSION: Soft tissue swelling without acute osseous abnormality identified.   Electronically Signed   By: Logan Bores   On: 08/14/2013 16:10   Mr Lumbar Spine W Wo Contrast  07/28/2013   CLINICAL DATA:  Low back pain with leg tingling and numbness.    IMPRESSION: 1. Small disc protrusion central and to the left at L4-5 without neural impingement. Extruded disc fragment has been removed. 2. 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   Electronically Signed   By: Rozetta Nunnery M.D.   On: 07/28/2013 14:13      Imaging Review Dg Chest Portable 1 View  08/25/2013   CLINICAL DATA:  Altered mental status.  Former smoker.  EXAM: PORTABLE CHEST - 1 VIEW  COMPARISON:  07/11/2011  FINDINGS: Cardiac silhouette is normal in size and configuration. Normal mediastinal and hilar contours. Clear lungs. No pleural effusion or pneumothorax. Bony thorax is demineralized but intact.  IMPRESSION: No active disease.   Electronically Signed   By: Lajean Manes M.D.   On: 08/25/2013 18:52     EKG Interpretation   Date/Time:  Thursday August 25 2013 16:44:31 EDT Ventricular Rate:  91 PR Interval:  155 QRS Duration: 106 QT Interval:  375 QTC Calculation: 461 R Axis:   -4 Text Interpretation:  Age not entered, assumed to be  63 years old for  purpose of ECG interpretation Sinus rhythm Consider left atrial  enlargement RSR' in V1 or V2, right VCD or RVH Nonspecific T  abnormalities, lateral leads Confirmed by Lathon Adan  MD-I, Cecilia Nishikawa ([id number]) on   08/25/2013 6:17:58 PM      MDM   Final diagnoses:  Dehydration  Hypokalemia    New Prescriptions   POTASSIUM CHLORIDE SA (K-DUR,KLOR-CON) 20 MEQ TABLET    Take 1 tablet (20 mEq total) by mouth 2 (two) times daily.    Plan discharge  Rolland Porter, MD, Alanson Aly, MD 08/25/13 2150

## 2013-08-25 NOTE — Discharge Instructions (Signed)
Drink plenty of fluids. Take the potassium pills until gone. Contact Dr Zigmund Daniel office to have them refill your medications. Return to the ED if you feel worse.  Dehydration, Adult Dehydration means your body does not have as much fluid as it needs. Your kidneys, brain, and heart will not work properly without the right amount of fluids and salt.  HOME CARE  Ask your doctor how to replace body fluid losses (rehydrate).  Drink enough fluids to keep your pee (urine) clear or pale yellow.  Drink small amounts of fluids often if you feel sick to your stomach (nauseous) or throw up (vomit).  Eat like you normally do.  Avoid:  Foods or drinks high in sugar.  Bubbly (carbonated) drinks.  Juice.  Very hot or cold fluids.  Drinks with caffeine.  Fatty, greasy foods.  Alcohol.  Tobacco.  Eating too much.  Gelatin desserts.  Wash your hands to avoid spreading germs (bacteria, viruses).  Only take medicine as told by your doctor.  Keep all doctor visits as told. GET HELP RIGHT AWAY IF:   You cannot drink something without throwing up.  You get worse even with treatment.  Your vomit has blood in it or looks greenish.  Your poop (stool) has blood in it or looks black and tarry.  You have not peed in 6 to 8 hours.  You pee a small amount of very dark pee.  You have a fever.  You pass out (faint).  You have belly (abdominal) pain that gets worse or stays in one spot (localizes).  You have a rash, stiff neck, or bad headache.  You get easily annoyed, sleepy, or are hard to wake up.  You feel weak, dizzy, or very thirsty. MAKE SURE YOU:   Understand these instructions.  Will watch your condition.  Will get help right away if you are not doing well or get worse. Document Released: 11/30/2008 Document Revised: 04/28/2011 Document Reviewed: 09/23/2010 [facility] Patient Information 2015 [place], [place]. This information is not intended to replace advice given to you  by your health care provider. Make sure you discuss any questions you have with your health care provider.  Hypokalemia Hypokalemia means that the amount of potassium in the blood is lower than normal.Potassium is a chemical, called an electrolyte, that helps regulate the amount of fluid in the body. It also stimulates muscle contraction and helps nerves function properly.Most of the body's potassium is inside of cells, and only a very small amount is in the blood. Because the amount in the blood is so small, minor changes can be life-threatening. CAUSES  Antibiotics.  Diarrhea or vomiting.  Using laxatives too much, which can cause diarrhea.  Chronic kidney disease.  Water pills (diuretics).  Eating disorders (bulimia).  Low magnesium level.  Sweating a lot. SIGNS AND SYMPTOMS  Weakness.  Constipation.  Fatigue.  Muscle cramps.  Mental confusion.  Skipped heartbeats or irregular heartbeat (palpitations).  Tingling or numbness. DIAGNOSIS  Your health care provider can diagnose hypokalemia with blood tests. In addition to checking your potassium level, your health care provider may also check other lab tests. TREATMENT Hypokalemia can be treated with potassium supplements taken by mouth or adjustments in your current medicines. If your potassium level is very low, you may need to get potassium through a vein (IV) and be monitored in the hospital. A diet high in potassium is also helpful. Foods high in potassium are:  Nuts, such as peanuts and pistachios.  Seeds, such as  sunflower seeds and pumpkin seeds.  Peas, lentils, and lima beans.  Whole grain and bran cereals and breads.  Fresh fruit and vegetables, such as apricots, avocado, bananas, cantaloupe, kiwi, oranges, tomatoes, asparagus, and potatoes.  Orange and tomato juices.  Red meats.  Fruit yogurt. HOME CARE INSTRUCTIONS  Take all medicines as prescribed by your health care provider.  Maintain a  healthy diet by including nutritious food, such as fruits, vegetables, nuts, whole grains, and lean meats.  If you are taking a laxative, be sure to follow the directions on the label. SEEK MEDICAL CARE IF:  Your weakness gets worse.  You feel your heart pounding or racing.  You are vomiting or having diarrhea.  You are diabetic and having trouble keeping your blood glucose in the normal range. SEEK IMMEDIATE MEDICAL CARE IF:  You have chest pain, shortness of breath, or dizziness.  You are vomiting or having diarrhea for more than 2 days.  You faint. MAKE SURE YOU:   Understand these instructions.  Will watch your condition.  Will get help right away if you are not doing well or get worse. Document Released: 02/03/2005 Document Revised: 11/24/2012 Document Reviewed: 08/06/2012 [facility] Patient Information 2015 [place], [place]. This information is not intended to replace advice given to you by your health care provider. Make sure you discuss any questions you have with your health care provider.

## 2013-08-25 NOTE — ED Notes (Signed)
Pt given [place] sandwich, graham crackers, and milk.

## 2013-08-25 NOTE — ED Notes (Signed)
Put patient on nasal canula.

## 2013-08-25 NOTE — ED Notes (Signed)
Unable to obtain enough blood for bloodwork, Philippa Chester Phlebotomist made aware.

## 2013-08-25 NOTE — ED Notes (Signed)
X-ray at bedside

## 2013-11-24 ENCOUNTER — Emergency Department (HOSPITAL_COMMUNITY)
Admission: EM | Admit: 2013-11-24 | Discharge: 2013-11-24 | Disposition: A | Discharge status: Home / Self Care | Payer: Medicare Other | Attending: Emergency Medicine | Admitting: Emergency Medicine

## 2013-11-24 ENCOUNTER — Emergency Department (HOSPITAL_COMMUNITY): Payer: Medicare Other

## 2013-11-24 ENCOUNTER — Encounter (HOSPITAL_COMMUNITY): Payer: Self-pay | Admitting: Emergency Medicine

## 2013-11-24 DIAGNOSIS — E119 Type 2 diabetes mellitus without complications: Secondary | ICD-10-CM | POA: Diagnosis not present

## 2013-11-24 DIAGNOSIS — Z791 Long term (current) use of non-steroidal anti-inflammatories (NSAID): Secondary | ICD-10-CM | POA: Insufficient documentation

## 2013-11-24 DIAGNOSIS — Z8669 Personal history of other diseases of the nervous system and sense organs: Secondary | ICD-10-CM | POA: Diagnosis not present

## 2013-11-24 DIAGNOSIS — Z79899 Other long term (current) drug therapy: Secondary | ICD-10-CM | POA: Diagnosis not present

## 2013-11-24 DIAGNOSIS — R4182 Altered mental status, unspecified: Secondary | ICD-10-CM | POA: Diagnosis present

## 2013-11-24 DIAGNOSIS — Y929 Unspecified place or not applicable: Secondary | ICD-10-CM | POA: Insufficient documentation

## 2013-11-24 DIAGNOSIS — Z8542 Personal history of malignant neoplasm of other parts of uterus: Secondary | ICD-10-CM | POA: Insufficient documentation

## 2013-11-24 DIAGNOSIS — Z043 Encounter for examination and observation following other accident: Secondary | ICD-10-CM | POA: Insufficient documentation

## 2013-11-24 DIAGNOSIS — W01198A Fall on same level from slipping, tripping and stumbling with subsequent striking against other object, initial encounter: Secondary | ICD-10-CM | POA: Diagnosis not present

## 2013-11-24 DIAGNOSIS — E785 Hyperlipidemia, unspecified: Secondary | ICD-10-CM | POA: Diagnosis not present

## 2013-11-24 DIAGNOSIS — Y939 Activity, unspecified: Secondary | ICD-10-CM | POA: Diagnosis not present

## 2013-11-24 DIAGNOSIS — R42 Dizziness and giddiness: Secondary | ICD-10-CM | POA: Diagnosis not present

## 2013-11-24 DIAGNOSIS — E039 Hypothyroidism, unspecified: Secondary | ICD-10-CM | POA: Insufficient documentation

## 2013-11-24 LAB — CBC
HCT: 44 % (ref 36.0–46.0)
Hemoglobin: 14.2 g/dL (ref 12.0–15.0)
MCH: 30.3 pg (ref 26.0–34.0)
MCHC: 32.3 g/dL (ref 30.0–36.0)
MCV: 93.8 fL (ref 78.0–100.0)
Platelets: 247 10*3/uL (ref 150–400)
RBC: 4.69 MIL/uL (ref 3.87–5.11)
RDW: 14.7 % (ref 11.5–15.5)
WBC: 12.5 10*3/uL — ABNORMAL HIGH (ref 4.0–10.5)

## 2013-11-24 LAB — COMPREHENSIVE METABOLIC PANEL
ALT: 19 U/L (ref 0–35)
AST: 23 U/L (ref 0–37)
Albumin: 3.9 g/dL (ref 3.5–5.2)
Alkaline Phosphatase: 72 U/L (ref 39–117)
Anion gap: 16 — ABNORMAL HIGH (ref 5–15)
BUN: 11 mg/dL (ref 6–23)
CO2: 27 mEq/L (ref 19–32)
Calcium: 9.9 mg/dL (ref 8.4–10.5)
Chloride: 97 mEq/L (ref 96–112)
Creatinine, Ser: 0.75 mg/dL (ref 0.50–1.10)
GFR calc Af Amer: >90 mL/min (ref >90)
GFR calc non Af Amer: 88 mL/min — ABNORMAL LOW (ref >90)
Glucose, Bld: 92 mg/dL (ref 70–99)
Potassium: 4.4 mEq/L (ref 3.7–5.3)
Sodium: 140 mEq/L (ref 137–147)
Total Bilirubin: 0.3 mg/dL (ref 0.3–1.2)
Total Protein: 7.8 g/dL (ref 6.0–8.3)

## 2013-11-24 LAB — URINALYSIS, ROUTINE W REFLEX MICROSCOPIC
Bilirubin Urine: NEGATIVE
Glucose, UA: NEGATIVE mg/dL
Hgb urine dipstick: NEGATIVE
Ketones, ur: NEGATIVE mg/dL
Leukocytes, UA: NEGATIVE
Nitrite: NEGATIVE
Protein, ur: NEGATIVE mg/dL
Specific Gravity, Urine: 1.012 (ref 1.005–1.030)
Urobilinogen, UA: 0.2 mg/dL (ref 0.0–1.0)
pH: 6 (ref 5.0–8.0)

## 2013-11-24 MED ORDER — SODIUM CHLORIDE 0.9 % IV BOLUS (SEPSIS)
500.0000 mL | Freq: Once | INTRAVENOUS | Status: AC
  Administered 2013-11-24: 500 mL via INTRAVENOUS

## 2013-11-24 NOTE — ED Provider Notes (Signed)
CSN: [id number]     Arrival date & time 11/24/13  1445 History   First MD Initiated Contact with Patient 11/24/13 1508     Chief Complaint  Patient presents with  . Fall  . Altered Mental Status     (Consider location/radiation/quality/duration/timing/severity/associated sxs/prior Treatment) HPI  63yF presenting after fall. She reports fall 2 days ago, hit back of head on concrete. No LOC at time of fall, not on blood thinners. Pt tripped on curb. Pt states she woke up this am with nausea and tremors. Pt reports she is also very sleepy today. Also reports decreased urinary output over the past 2 days, no burning with urination, odor or strange color to urine.    Past Medical History  Diagnosis Date  . Bipolar 1 disorder   . Hypothyroid   . Dyslipidemia   . Retinal detachment   . Sleep apnea   . Diabetes mellitus     dx within last yr....just takes pills  . Cancer     cancer of uterus...no chemo or radiation   Past Surgical History  Procedure Laterality Date  . Abdominal hysterectomy    . Total abdominal hysterectomy w/ bilateral salpingoophorectomy    . Tonsillectomy    . Lumbar laminectomy/decompression microdiscectomy  07/17/2011    Procedure: LUMBAR LAMINECTOMY/DECOMPRESSION MICRODISCECTOMY;  Surgeon: Sinclair Ship, MD;  Location: [place];  Service: Orthopedics;  Laterality: Left;  Left sided lumbar 4-5 microdisectomy   History reviewed. No pertinent family history. History  Substance Use Topics  . Smoking status: Former Smoker -- 2.00 packs/day for 35 years    Types: Cigarettes    Quit date: 07/09/1999  . Smokeless tobacco: Not on file  . Alcohol Use: No     Comment: socially   OB History   Grav Para Term Preterm Abortions TAB SAB Ect Mult Living                 Review of Systems  All systems reviewed and negative, other than as noted in HPI.   Allergies  Lithium  Home Medications   Prior to Admission medications   Medication Sig Start Date End  Date Taking? Authorizing Provider  atorvastatin (LIPITOR) 40 MG tablet Take 40 mg by mouth every evening.     Historical Provider, MD  cyclobenzaprine (FLEXERIL) 10 MG tablet Take 10 mg by mouth 3 (three) times daily.    Historical Provider, MD  divalproex (DEPAKOTE) 500 MG DR tablet Take 500-1,000 mg by mouth 2 (two) times daily. Take 1 tablet in the morning and 2 tablets in the evening    Historical Provider, MD  docusate sodium (COLACE) 100 MG capsule Take 100 mg by mouth daily as needed for mild constipation.    Historical Provider, MD  fentaNYL (DURAGESIC - DOSED MCG/HR) 25 MCG/HR patch Place 1 patch onto the skin every other day.  08/10/13   Historical Provider, MD  furosemide (LASIX) 80 MG tablet Take 80 mg by mouth daily.    Historical Provider, MD  gabapentin (NEURONTIN) 300 MG capsule Take 300 mg by mouth 3 (three) times daily.    Historical Provider, MD  hydrochlorothiazide (HYDRODIURIL) 25 MG tablet Take 25 mg by mouth every morning.    Historical Provider, MD  levothyroxine (SYNTHROID, LEVOTHROID) 175 MCG tablet Take 175 mcg by mouth daily before breakfast.    Historical Provider, MD  lidocaine (XYLOCAINE) 5 % ointment Apply 1 application topically daily. Apply to ankles and heels 06/29/13   Historical Provider, MD  metFORMIN (GLUCOPHAGE) 500 MG tablet Take 500 mg by mouth 2 (two) times daily with a meal.    Historical Provider, MD  naproxen (NAPROSYN) 500 MG tablet Take 1 tablet (500 mg total) by mouth 2 (two) times daily with a meal. 08/14/13   Courtney A Forcucci, PA-C  oxyCODONE-acetaminophen (PERCOCET) 10-325 MG per tablet Take 1 tablet by mouth 4 (four) times daily.    Historical Provider, MD  potassium chloride (K-DUR) 10 MEQ tablet Take 10 mEq by mouth every morning.     Historical Provider, MD  potassium chloride SA (K-DUR,KLOR-CON) 20 MEQ tablet Take 1 tablet (20 mEq total) by mouth 2 (two) times daily. 08/25/13   Janice Norrie, MD  VOLTAREN 1 % GEL Apply 1 application topically  daily. 06/29/13   Historical Provider, MD  zolpidem (AMBIEN) 10 MG tablet Take 10 mg by mouth at bedtime.     Historical Provider, MD   BP 100/62  Pulse 88  Temp(Src) 97.8 F (36.6 C) (Oral)  Resp 20  SpO2 96% Physical Exam  Nursing note and vitals reviewed. Constitutional: She appears well-developed and well-nourished. No distress.  HENT:  Head: Normocephalic and atraumatic.  Eyes: Conjunctivae and EOM are normal. Pupils are equal, round, and reactive to light. Right eye exhibits no discharge. Left eye exhibits no discharge.  Neck: Neck supple.  Cardiovascular: Normal rate, regular rhythm and normal heart sounds.  Exam reveals no gallop and no friction rub.   No murmur heard. Pulmonary/Chest: Effort normal and breath sounds normal. No respiratory distress.  Abdominal: Soft. She exhibits no distension. There is no tenderness.  Musculoskeletal: She exhibits no edema and no tenderness.  No midline spinal tenderness  Neurological: She is alert. No cranial nerve deficit. She exhibits normal muscle tone. Coordination normal.  Good finger to nose b/l  Skin: Skin is warm and dry. She is not diaphoretic.  Psychiatric: She has a normal mood and affect. Her behavior is normal. Thought content normal.    ED Course  Procedures (including critical care time) Labs Review Labs Reviewed  COMPREHENSIVE METABOLIC PANEL  CBC  URINALYSIS, ROUTINE W REFLEX MICROSCOPIC    Imaging Review Ct Head Wo Contrast  11/24/2013   CLINICAL DATA:  Fall 2 days ago striking the back of the head on concrete. Nausea and tremors. Somnolence.  EXAM: CT HEAD WITHOUT CONTRAST  TECHNIQUE: Contiguous axial images were obtained from the base of the skull through the vertex without intravenous contrast.  COMPARISON:  Multiple exams, including 01/30/2008  FINDINGS: The brainstem, cerebellum, cerebral peduncles, thalamus, basal ganglia, basilar cisterns, and ventricular system appear within normal limits. No intracranial  hemorrhage, mass lesion, or acute CVA. Periventricular white matter and corona radiata hypodensities favor chronic ischemic microvascular white matter disease.  IMPRESSION: 1. No acute intracranial findings. 2. Periventricular white matter and corona radiata hypodensities favor chronic ischemic microvascular white matter disease.   Electronically Signed   By: Sherryl Barters M.D.   On: 11/24/2013 16:03     EKG Interpretation None      MDM   Final diagnoses:  Dizziness and giddiness        Virgel Manifold, MD 12/01/13 [phone]

## 2013-11-24 NOTE — ED Notes (Addendum)
Pt reports fall 2 days ago, hit back of head on concrete. No LOC at time of fall, not on blood thinners. Pt tripped on curb. Pt states she woke up this am with nausea and tremors. Pt reports she is also very sleepy today. Also reports decreased urinary output over the past 2 days, no burning with urination, odor or strange color to urine. Pt disoriented to time. Pt also reports L leg pain, pt ambulatory.

## 2013-11-24 NOTE — ED Notes (Signed)
Patient is alert and oriented x3.  She was given DC instructions and follow up visit instructions.  Patient gave verbal understanding. She was DC ambulatory under her own power to home.  V/S stable.  He was not showing any signs of distress on DC 

## 2013-11-24 NOTE — ED Notes (Signed)
Bed: WA10 Expected date:  Expected time:  Means of arrival:  Comments: ems 

## 2013-11-24 NOTE — Discharge Instructions (Signed)
Dizziness   Dizziness means you feel unsteady or lightheaded. You might feel like you are going to pass out (faint).  HOME CARE   · Drink enough fluids to keep your pee (urine) clear or pale yellow.  · Take your medicines exactly as told by your doctor. If you take blood pressure medicine, always stand up slowly from the lying or sitting position. Hold on to something to steady yourself.  · If you need to stand in one place for a long time, move your legs often. Tighten and relax your leg muscles.  · Have someone stay with you until you feel okay.  · Do not drive or use heavy machinery if you feel dizzy.  · Do not drink alcohol.  GET HELP RIGHT AWAY IF:   · You feel dizzy or lightheaded and it gets worse.  · You feel sick to your stomach (nauseous), or you throw up (vomit).  · You have trouble talking or walking.  · You feel weak or have trouble using your arms, hands, or legs.  · You cannot think clearly or have trouble forming sentences.  · You have chest pain, belly (abdominal) pain, sweating, or you are short of breath.  · Your vision changes.  · You are bleeding.  · You have problems from your medicine that seem to be getting worse.  MAKE SURE YOU:   · Understand these instructions.  · Will watch your condition.  · Will get help right away if you are not doing well or get worse.  Document Released: 01/23/2011 Document Revised: 04/28/2011 Document Reviewed: 01/23/2011  [employer]® Patient Information ©2015 [employer]. This information is not intended to replace advice given to you by your health care provider. Make sure you discuss any questions you have with your health care provider.

## 2013-11-24 NOTE — ED Notes (Signed)
Pt ambulated for 50 feet.  Pt showed no signs of respiratory distress.  Pt maintained a steady gait.   Pt vital signs were within normal limits.

## 2014-04-01 ENCOUNTER — Emergency Department (HOSPITAL_COMMUNITY): Payer: Medicare Other

## 2014-04-01 ENCOUNTER — Emergency Department (HOSPITAL_COMMUNITY)
Admission: EM | Admit: 2014-04-01 | Discharge: 2014-04-02 | Disposition: A | Discharge status: Home / Self Care | Payer: Medicare Other | Attending: Emergency Medicine | Admitting: Emergency Medicine

## 2014-04-01 ENCOUNTER — Encounter (HOSPITAL_COMMUNITY): Payer: Self-pay | Admitting: Emergency Medicine

## 2014-04-01 DIAGNOSIS — E039 Hypothyroidism, unspecified: Secondary | ICD-10-CM | POA: Diagnosis not present

## 2014-04-01 DIAGNOSIS — F319 Bipolar disorder, unspecified: Secondary | ICD-10-CM | POA: Diagnosis not present

## 2014-04-01 DIAGNOSIS — E785 Hyperlipidemia, unspecified: Secondary | ICD-10-CM | POA: Diagnosis not present

## 2014-04-01 DIAGNOSIS — Z8542 Personal history of malignant neoplasm of other parts of uterus: Secondary | ICD-10-CM | POA: Diagnosis not present

## 2014-04-01 DIAGNOSIS — Z791 Long term (current) use of non-steroidal anti-inflammatories (NSAID): Secondary | ICD-10-CM | POA: Diagnosis not present

## 2014-04-01 DIAGNOSIS — E119 Type 2 diabetes mellitus without complications: Secondary | ICD-10-CM | POA: Diagnosis not present

## 2014-04-01 DIAGNOSIS — L03115 Cellulitis of right lower limb: Secondary | ICD-10-CM | POA: Diagnosis not present

## 2014-04-01 DIAGNOSIS — Z79899 Other long term (current) drug therapy: Secondary | ICD-10-CM | POA: Diagnosis not present

## 2014-04-01 DIAGNOSIS — H539 Unspecified visual disturbance: Secondary | ICD-10-CM | POA: Insufficient documentation

## 2014-04-01 DIAGNOSIS — H578 Other specified disorders of eye and adnexa: Secondary | ICD-10-CM | POA: Insufficient documentation

## 2014-04-01 DIAGNOSIS — Z87891 Personal history of nicotine dependence: Secondary | ICD-10-CM | POA: Insufficient documentation

## 2014-04-01 DIAGNOSIS — R51 Headache: Secondary | ICD-10-CM | POA: Insufficient documentation

## 2014-04-01 DIAGNOSIS — R600 Localized edema: Secondary | ICD-10-CM

## 2014-04-01 DIAGNOSIS — H5711 Ocular pain, right eye: Secondary | ICD-10-CM

## 2014-04-01 DIAGNOSIS — R531 Weakness: Secondary | ICD-10-CM | POA: Insufficient documentation

## 2014-04-01 LAB — I-STAT TROPONIN, ED: Troponin i, poc: 0 ng/mL (ref 0.00–0.08)

## 2014-04-01 LAB — URINALYSIS, ROUTINE W REFLEX MICROSCOPIC
BILIRUBIN URINE: NEGATIVE
Glucose, UA: NEGATIVE mg/dL
HGB URINE DIPSTICK: NEGATIVE
Ketones, ur: NEGATIVE mg/dL
Nitrite: NEGATIVE
PH: 6.5 (ref 5.0–8.0)
Protein, ur: NEGATIVE mg/dL
Specific Gravity, Urine: 1.008 (ref 1.005–1.030)
UROBILINOGEN UA: 1 mg/dL (ref 0.0–1.0)

## 2014-04-01 LAB — BASIC METABOLIC PANEL
Anion gap: 6 (ref 5–15)
BUN: 11 mg/dL (ref 6–23)
CHLORIDE: 100 mmol/L (ref 96–112)
CO2: 31 mmol/L (ref 19–32)
Calcium: 9 mg/dL (ref 8.4–10.5)
Creatinine, Ser: 0.72 mg/dL (ref 0.50–1.10)
GFR calc Af Amer: >90 mL/min (ref >90)
GFR, EST NON AFRICAN AMERICAN: 88 mL/min — AB (ref >90)
GLUCOSE: 88 mg/dL (ref 70–99)
POTASSIUM: 4.5 mmol/L (ref 3.5–5.1)
SODIUM: 137 mmol/L (ref 135–145)

## 2014-04-01 LAB — CBC
HCT: 38.5 % (ref 36.0–46.0)
Hemoglobin: 12.3 g/dL (ref 12.0–15.0)
MCH: 31.1 pg (ref 26.0–34.0)
MCHC: 31.9 g/dL (ref 30.0–36.0)
MCV: 97.5 fL (ref 78.0–100.0)
PLATELETS: 304 10*3/uL (ref 150–400)
RBC: 3.95 MIL/uL (ref 3.87–5.11)
RDW: 14.7 % (ref 11.5–15.5)
WBC: 9.7 10*3/uL (ref 4.0–10.5)

## 2014-04-01 LAB — BRAIN NATRIURETIC PEPTIDE: B NATRIURETIC PEPTIDE 5: 37.7 pg/mL (ref 0.0–100.0)

## 2014-04-01 LAB — URINE MICROSCOPIC-ADD ON

## 2014-04-01 MED ORDER — HYDROCODONE-ACETAMINOPHEN 5-325 MG PO TABS: 1.0000 | ORAL_TABLET | Freq: Four times a day (QID) | ORAL | Status: DC | PRN

## 2014-04-01 MED ORDER — FLUORESCEIN SODIUM 1 MG OP STRP
1.0000 | ORAL_STRIP | Freq: Once | OPHTHALMIC | Status: DC
  Filled 2014-04-01: qty 1

## 2014-04-01 MED ORDER — CLINDAMYCIN HCL 150 MG PO CAPS: 450.0000 mg | ORAL_CAPSULE | Freq: Four times a day (QID) | ORAL | Status: DC

## 2014-04-01 MED ORDER — TETRACAINE HCL 0.5 % OP SOLN
1.0000 [drp] | Freq: Once | OPHTHALMIC | Status: DC
  Filled 2014-04-01: qty 2

## 2014-04-01 MED ORDER — CLINDAMYCIN PHOSPHATE 600 MG/50ML IV SOLN
600.0000 mg | Freq: Once | INTRAVENOUS | Status: AC
  Administered 2014-04-01: 600 mg via INTRAVENOUS
  Filled 2014-04-01: qty 50

## 2014-04-01 NOTE — Discharge Instructions (Signed)
Call for a follow up appointment with a Family or Primary Care Provider.  Call for a follow-up appointment with a eye specialist for further evaluation of your eye discomfort. Return if Symptoms worsen.   Take medication as prescribed.  Do not take extra tylenol.

## 2014-04-01 NOTE — ED Provider Notes (Signed)
CSN: [id number]     Arrival date & time 04/01/14  1810 History   First MD Initiated Contact with Patient 04/01/14 1854     Chief Complaint  Patient presents with  . Leg Swelling  . Eye Pain     (Consider location/radiation/quality/duration/timing/severity/associated sxs/prior Treatment) HPI Comments: The patient is a 64 year old female with a mass medical history of bipolar, hypothyroidism, dyslipidemia, legally blind, diabetes present emergency room chief complaint of bilateral feet edema for 7 days. She reports right lower extremity edema slightly greater than left. Patient denies chest pain or shortness of breath. She reports associated pain and weakness in the legs. The patient reports edema of abdomen, denies weight gain, reports losing weight. The patient also reports decreased urine output approximating it at 100 mL did not measure she also reports it is concentrated. She reports compliance with her Lasix, is unable to quantify urine output prior. Denies recent medication change. She reports a decreased oral intake of fluids and solids. The patient reports multiple falls over the last week due to lower extremity weakness. She reports hitting her head twice denies loss of consciousness. She reports last fall 2 days ago. The patient also complains of right eye pain she reports she scratched her right eye earlier today. She reports she is legally blind but has some sight remaining.  PCP: Horald Pollen  Patient is a 64 y.o. female presenting with eye pain. The history is provided by the patient. No language interpreter was used.  Eye Pain Associated symptoms include arthralgias, headaches and weakness. Pertinent negatives include no abdominal pain, chest pain, chills, fever or nausea.    Past Medical History  Diagnosis Date  . Bipolar 1 disorder   . Hypothyroid   . Dyslipidemia   . Retinal detachment   . Sleep apnea   . Diabetes mellitus     dx within last yr....just takes pills  .  Cancer     cancer of uterus...no chemo or radiation   Past Surgical History  Procedure Laterality Date  . Abdominal hysterectomy    . Total abdominal hysterectomy w/ bilateral salpingoophorectomy    . Tonsillectomy    . Lumbar laminectomy/decompression microdiscectomy  07/17/2011    Procedure: LUMBAR LAMINECTOMY/DECOMPRESSION MICRODISCECTOMY;  Surgeon: Sinclair Ship, MD;  Location: [facility];  Service: Orthopedics;  Laterality: Left;  Left sided lumbar 4-5 microdisectomy   History reviewed. No pertinent family history. History  Substance Use Topics  . Smoking status: Former Smoker -- 2.00 packs/day for 35 years    Types: Cigarettes    Quit date: 07/09/1999  . Smokeless tobacco: Not on file  . Alcohol Use: No     Comment: socially   OB History    No data available     Review of Systems  Constitutional: Negative for fever and chills.  Eyes: Positive for pain and visual disturbance. Negative for discharge and itching.  Respiratory: Negative for chest tightness and shortness of breath.   Cardiovascular: Positive for leg swelling. Negative for chest pain and palpitations.  Gastrointestinal: Negative for nausea and abdominal pain.  Musculoskeletal: Positive for arthralgias.  Skin: Positive for color change.  Neurological: Positive for weakness, light-headedness and headaches.      Allergies  Lithium  Home Medications   Prior to Admission medications   Medication Sig Start Date End Date Taking? Authorizing Provider  atorvastatin (LIPITOR) 40 MG tablet Take 40 mg by mouth every evening.     Historical Provider, MD  cyclobenzaprine (FLEXERIL) 10 MG tablet Take  10 mg by mouth 3 (three) times daily.    Historical Provider, MD  divalproex (DEPAKOTE) 500 MG DR tablet Take 500-1,000 mg by mouth 2 (two) times daily. Take 1 tablet in the morning and 2 tablets in the evening    Historical Provider, MD  docusate sodium (COLACE) 100 MG capsule Take 100 mg by mouth daily as needed for  mild constipation (constipation).     Historical Provider, MD  fentaNYL (DURAGESIC - DOSED MCG/HR) 25 MCG/HR patch Place 1 patch onto the skin every other day.  08/10/13   Historical Provider, MD  furosemide (LASIX) 80 MG tablet Take 80 mg by mouth daily.    Historical Provider, MD  gabapentin (NEURONTIN) 300 MG capsule Take 300 mg by mouth 3 (three) times daily.    Historical Provider, MD  hydrochlorothiazide (HYDRODIURIL) 25 MG tablet Take 25 mg by mouth every morning.    Historical Provider, MD  levothyroxine (SYNTHROID, LEVOTHROID) 175 MCG tablet Take 175 mcg by mouth daily before breakfast.    Historical Provider, MD  lidocaine (XYLOCAINE) 5 % ointment Apply 1 application topically daily. Apply to ankles and heels 06/29/13   Historical Provider, MD  metFORMIN (GLUCOPHAGE) 500 MG tablet Take 500 mg by mouth 2 (two) times daily with a meal.    Historical Provider, MD  naproxen (NAPROSYN) 500 MG tablet Take 1 tablet (500 mg total) by mouth 2 (two) times daily with a meal. 08/14/13   Courtney A Forcucci, PA-C  oxyCODONE-acetaminophen (PERCOCET) 10-325 MG per tablet Take 1 tablet by mouth 4 (four) times daily.    Historical Provider, MD  potassium chloride (K-DUR) 10 MEQ tablet Take 10 mEq by mouth every morning.     Historical Provider, MD  potassium chloride SA (K-DUR,KLOR-CON) 20 MEQ tablet Take 1 tablet (20 mEq total) by mouth 2 (two) times daily. 08/25/13   Janice Norrie, MD  VOLTAREN 1 % GEL Apply 1 application topically daily. 06/29/13   Historical Provider, MD  zolpidem (AMBIEN) 10 MG tablet Take 10 mg by mouth at bedtime.     Historical Provider, MD   BP 122/68 mmHg  Pulse 90  Temp(Src) 98.5 F (36.9 C) (Oral)  Resp 16  SpO2 96% Physical Exam  Constitutional: She is oriented to person, place, and time. She appears well-developed and well-nourished. No distress.  HENT:  Head: Normocephalic and atraumatic.  Eyes: EOM are normal. Left eye exhibits no chemosis and no discharge. Left conjunctiva  is not injected.  Slit lamp exam:      The right eye shows no fluorescein uptake.       The left eye shows no corneal flare, no corneal ulcer, no foreign body and no fluorescein uptake.  Left eye hazy appearance.  Neck: Normal range of motion. Neck supple.  Cardiovascular: Normal rate and regular rhythm.   No murmur heard. Bilateral pitting edema extending into proximal tibia.  Pulmonary/Chest: Effort normal. No respiratory distress. She has no wheezes. She has no rales.  Patient is able to speak in complete sentences.   Abdominal: Soft. Bowel sounds are normal. There is no tenderness. There is no rebound and no guarding.  Musculoskeletal: Normal range of motion.       Right foot: There is tenderness. There is no crepitus and no deformity.       Feet:  Small abrasion to second toe, no drainage. Abrasion to second 2. Dorsum of foot swollen, and erythema extending to distal tibia with associated increase in warmth to touch.  Neurological: She is alert and oriented to person, place, and time.  Normal sensation and strength to lower extremities.  Speech is clear and goal oriented, follows commands II-Visual fields were normal.   III/IV/VI- Extraocular movements were full and conjugate.  V/VII-no facial droop.   VIII-normal.   Motor: Strength 5/5 to upper and lower extremities bilaterally. Moves all 4 extremities equally. Sensory: normal sensation to upper and lower extremities.   Skin: Skin is warm and dry. She is not diaphoretic. There is erythema.  Psychiatric: She has a normal mood and affect. Her behavior is normal.  Nursing note and vitals reviewed.   ED Course  Procedures (including critical care time) Labs Review Labs Reviewed  BASIC METABOLIC PANEL - Abnormal; Notable for the following:    GFR calc non Af Amer 88 (*)    All other components within normal limits  URINALYSIS, ROUTINE W REFLEX MICROSCOPIC - Abnormal; Notable for the following:    Leukocytes, UA TRACE (*)     All other components within normal limits  URINE MICROSCOPIC-ADD ON - Abnormal; Notable for the following:    Bacteria, UA FEW (*)    All other components within normal limits  URINE CULTURE  CBC  BRAIN NATRIURETIC PEPTIDE  I-STAT TROPOININ, ED    Imaging Review Dg Chest 2 View  04/01/2014   CLINICAL DATA:  64 year old female with lower extremity swelling, fall, pain. Initial encounter.  EXAM: CHEST  2 VIEW  COMPARISON:  08/25/2013 and earlier.  FINDINGS: Upright AP and lateral views. Chronic multilevel left posterior rib deformities. Show low lung volumes on the lateral. Normal cardiac size and mediastinal contours. No pneumothorax, pulmonary edema, pleural effusion or consolidation. Basilar atelectasis. Please visible exostosis of the left humerus shaft. No acute osseous abnormality identified.  IMPRESSION: Low lung volumes with atelectasis, otherwise no acute cardiopulmonary abnormality.   Electronically Signed   By: Genevie Ann M.D.   On: 04/01/2014 19:43     EKG Interpretation   Date/Time:  Saturday April 01 2014 19:51:24 EST Ventricular Rate:  78 PR Interval:  144 QRS Duration: 100 QT Interval:  382 QTC Calculation: 435 R Axis:   -8 Text Interpretation:  Sinus rhythm Low voltage, precordial leads RSR' in  V1 or V2, probably normal variant Baseline wander in lead(s) V2 No  significant change since last tracing Confirmed by YAO  MD, DAVID ([id number])  on 04/01/2014 10:18:37 PM      MDM   Final diagnoses:  Cellulitis of foot, right  Bilateral leg edema  Eye discomfort, right   Patient presents with bilateral lower shin the edema, right greater than left with associated erythema and increase in warmth to touch. Likely cellulitis. Patient is afebrile patient had a negative chest x-ray, EKG without significant changes. No leukocytosis, negative troponin, negative BNP CBC without concerning abnormalities BMP without concerning abnormalities. Patient also complains of urinary symptoms,  UA negative for infection, culture sent. Patient also complains of right eye discomfort after a external scratch, no pleural uptake. Plan to discharge home with clindamycin and an eye specialist follow-up.  Re-eval pt resting in room, discussed results and treatment plan with the patient and patein's family. Discussed patient history, condition and workup with Dr. Darl Householder. After his encounter with the patient agrees with plan. Discussed lab results, imaging results, and treatment plan with the patient. Return precautions given. Reports understanding and no other concerns at this time.  Patient is stable for discharge at this time.     Harvie Heck, PA-C  04/01/14 [place] Yao, MD 04/01/14 [phone]

## 2014-04-01 NOTE — ED Notes (Addendum)
Pt reports bilateral leg swelling for the past 10 days along with generalized leg pain. Pt also reports she scratched her R eye today, pt legally blind. Pt reports she fell 10x today, complaining of pain all over. Pt reports dizziness for past three days that is different each day. Pt states "I feel like I could sleep forever." Pt states she did hit her head, no LOC. Pt drinking coffee and eating a muffin in the lobby against repeat instruction.

## 2014-04-01 NOTE — ED Notes (Addendum)
EKG given to EDP Miller for review 

## 2014-04-01 NOTE — ED Notes (Signed)
Pt ambulatory to bathroom with no assistance

## 2014-04-03 LAB — URINE CULTURE
CULTURE: NO GROWTH
Colony Count: NO GROWTH

## 2014-04-18 ENCOUNTER — Emergency Department (HOSPITAL_COMMUNITY): Payer: Medicare Other

## 2014-04-18 ENCOUNTER — Inpatient Hospital Stay (HOSPITAL_COMMUNITY): Payer: Medicare Other

## 2014-04-18 ENCOUNTER — Encounter (HOSPITAL_COMMUNITY): Payer: Self-pay

## 2014-04-18 ENCOUNTER — Inpatient Hospital Stay (HOSPITAL_COMMUNITY)
Admission: EM | Admit: 2014-04-18 | Discharge: 2014-04-24 | DRG: 871 | Disposition: A | Discharge status: Skilled Nursing Facility | Payer: Medicare Other | Attending: Internal Medicine | Admitting: Internal Medicine

## 2014-04-18 ENCOUNTER — Other Ambulatory Visit (HOSPITAL_COMMUNITY): Payer: Self-pay

## 2014-04-18 DIAGNOSIS — J9602 Acute respiratory failure with hypercapnia: Secondary | ICD-10-CM | POA: Diagnosis present

## 2014-04-18 DIAGNOSIS — I471 Supraventricular tachycardia: Secondary | ICD-10-CM | POA: Diagnosis present

## 2014-04-18 DIAGNOSIS — F419 Anxiety disorder, unspecified: Secondary | ICD-10-CM | POA: Diagnosis not present

## 2014-04-18 DIAGNOSIS — Z4659 Encounter for fitting and adjustment of other gastrointestinal appliance and device: Secondary | ICD-10-CM | POA: Insufficient documentation

## 2014-04-18 DIAGNOSIS — L03116 Cellulitis of left lower limb: Secondary | ICD-10-CM | POA: Diagnosis present

## 2014-04-18 DIAGNOSIS — T6591XA Toxic effect of unspecified substance, accidental (unintentional), initial encounter: Secondary | ICD-10-CM | POA: Diagnosis present

## 2014-04-18 DIAGNOSIS — I5020 Unspecified systolic (congestive) heart failure: Secondary | ICD-10-CM | POA: Diagnosis not present

## 2014-04-18 DIAGNOSIS — A419 Sepsis, unspecified organism: Principal | ICD-10-CM | POA: Diagnosis present

## 2014-04-18 DIAGNOSIS — I480 Paroxysmal atrial fibrillation: Secondary | ICD-10-CM | POA: Diagnosis present

## 2014-04-18 DIAGNOSIS — G9340 Encephalopathy, unspecified: Secondary | ICD-10-CM | POA: Diagnosis not present

## 2014-04-18 DIAGNOSIS — I10 Essential (primary) hypertension: Secondary | ICD-10-CM | POA: Diagnosis present

## 2014-04-18 DIAGNOSIS — Z79899 Other long term (current) drug therapy: Secondary | ICD-10-CM | POA: Diagnosis not present

## 2014-04-18 DIAGNOSIS — Z87891 Personal history of nicotine dependence: Secondary | ICD-10-CM

## 2014-04-18 DIAGNOSIS — R05 Cough: Secondary | ICD-10-CM | POA: Diagnosis not present

## 2014-04-18 DIAGNOSIS — G4730 Sleep apnea, unspecified: Secondary | ICD-10-CM | POA: Diagnosis present

## 2014-04-18 DIAGNOSIS — I272 Other secondary pulmonary hypertension: Secondary | ICD-10-CM | POA: Diagnosis present

## 2014-04-18 DIAGNOSIS — R404 Transient alteration of awareness: Secondary | ICD-10-CM | POA: Diagnosis present

## 2014-04-18 DIAGNOSIS — Z22322 Carrier or suspected carrier of Methicillin resistant Staphylococcus aureus: Secondary | ICD-10-CM

## 2014-04-18 DIAGNOSIS — E119 Type 2 diabetes mellitus without complications: Secondary | ICD-10-CM | POA: Diagnosis present

## 2014-04-18 DIAGNOSIS — R5381 Other malaise: Secondary | ICD-10-CM | POA: Diagnosis not present

## 2014-04-18 DIAGNOSIS — R0600 Dyspnea, unspecified: Secondary | ICD-10-CM | POA: Diagnosis not present

## 2014-04-18 DIAGNOSIS — D751 Secondary polycythemia: Secondary | ICD-10-CM | POA: Diagnosis present

## 2014-04-18 DIAGNOSIS — J690 Pneumonitis due to inhalation of food and vomit: Secondary | ICD-10-CM | POA: Diagnosis present

## 2014-04-18 DIAGNOSIS — G40909 Epilepsy, unspecified, not intractable, without status epilepticus: Secondary | ICD-10-CM | POA: Diagnosis present

## 2014-04-18 DIAGNOSIS — F319 Bipolar disorder, unspecified: Secondary | ICD-10-CM | POA: Diagnosis present

## 2014-04-18 DIAGNOSIS — J9600 Acute respiratory failure, unspecified whether with hypoxia or hypercapnia: Secondary | ICD-10-CM

## 2014-04-18 DIAGNOSIS — E876 Hypokalemia: Secondary | ICD-10-CM | POA: Diagnosis not present

## 2014-04-18 DIAGNOSIS — E785 Hyperlipidemia, unspecified: Secondary | ICD-10-CM | POA: Diagnosis present

## 2014-04-18 DIAGNOSIS — R059 Cough, unspecified: Secondary | ICD-10-CM

## 2014-04-18 DIAGNOSIS — Z8542 Personal history of malignant neoplasm of other parts of uterus: Secondary | ICD-10-CM | POA: Diagnosis not present

## 2014-04-18 DIAGNOSIS — J9601 Acute respiratory failure with hypoxia: Secondary | ICD-10-CM | POA: Insufficient documentation

## 2014-04-18 DIAGNOSIS — R401 Stupor: Secondary | ICD-10-CM

## 2014-04-18 DIAGNOSIS — R918 Other nonspecific abnormal finding of lung field: Secondary | ICD-10-CM

## 2014-04-18 DIAGNOSIS — R0602 Shortness of breath: Secondary | ICD-10-CM | POA: Insufficient documentation

## 2014-04-18 DIAGNOSIS — E039 Hypothyroidism, unspecified: Secondary | ICD-10-CM | POA: Diagnosis present

## 2014-04-18 DIAGNOSIS — I4891 Unspecified atrial fibrillation: Secondary | ICD-10-CM | POA: Diagnosis not present

## 2014-04-18 DIAGNOSIS — I959 Hypotension, unspecified: Secondary | ICD-10-CM | POA: Diagnosis present

## 2014-04-18 DIAGNOSIS — E872 Acidosis: Secondary | ICD-10-CM | POA: Diagnosis present

## 2014-04-18 DIAGNOSIS — R4182 Altered mental status, unspecified: Secondary | ICD-10-CM

## 2014-04-18 DIAGNOSIS — R4189 Other symptoms and signs involving cognitive functions and awareness: Secondary | ICD-10-CM | POA: Insufficient documentation

## 2014-04-18 DIAGNOSIS — Z87898 Personal history of other specified conditions: Secondary | ICD-10-CM

## 2014-04-18 LAB — CBC WITH DIFFERENTIAL/PLATELET
BASOS ABS: 0 10*3/uL (ref 0.0–0.1)
Basophils Relative: 0 % (ref 0–1)
EOS ABS: 0 10*3/uL (ref 0.0–0.7)
Eosinophils Relative: 0 % (ref 0–5)
HEMATOCRIT: 47.4 % — AB (ref 36.0–46.0)
HEMOGLOBIN: 15 g/dL (ref 12.0–15.0)
Lymphocytes Relative: 16 % (ref 12–46)
Lymphs Abs: 2 10*3/uL (ref 0.7–4.0)
MCH: 30.7 pg (ref 26.0–34.0)
MCHC: 31.6 g/dL (ref 30.0–36.0)
MCV: 96.9 fL (ref 78.0–100.0)
Monocytes Absolute: 1.6 10*3/uL — ABNORMAL HIGH (ref 0.1–1.0)
Monocytes Relative: 13 % — ABNORMAL HIGH (ref 3–12)
Neutro Abs: 9.1 10*3/uL — ABNORMAL HIGH (ref 1.7–7.7)
Neutrophils Relative %: 71 % (ref 43–77)
PLATELETS: 283 10*3/uL (ref 150–400)
RBC: 4.89 MIL/uL (ref 3.87–5.11)
RDW: 13.7 % (ref 11.5–15.5)
WBC: 12.8 10*3/uL — ABNORMAL HIGH (ref 4.0–10.5)

## 2014-04-18 LAB — I-STAT ARTERIAL BLOOD GAS, ED
Acid-base deficit: 3 mmol/L — ABNORMAL HIGH (ref 0.0–2.0)
Bicarbonate: 25.6 mEq/L — ABNORMAL HIGH (ref 20.0–24.0)
O2 Saturation: 100 %
TCO2: 27 mmol/L (ref 0–100)
pCO2 arterial: 57.9 mmHg (ref 35.0–45.0)
pH, Arterial: 7.253 — ABNORMAL LOW (ref 7.350–7.450)
pO2, Arterial: 332 mmHg — ABNORMAL HIGH (ref 80.0–100.0)

## 2014-04-18 LAB — RAPID URINE DRUG SCREEN, HOSP PERFORMED
Amphetamines: NOT DETECTED
BARBITURATES: NOT DETECTED
BENZODIAZEPINES: POSITIVE — AB
COCAINE: NOT DETECTED
OPIATES: POSITIVE — AB
Tetrahydrocannabinol: NOT DETECTED

## 2014-04-18 LAB — COMPREHENSIVE METABOLIC PANEL
ALK PHOS: 62 U/L (ref 39–117)
ALT: 17 U/L (ref 0–35)
AST: 26 U/L (ref 0–37)
Albumin: 3.6 g/dL (ref 3.5–5.2)
Anion gap: 10 (ref 5–15)
BILIRUBIN TOTAL: 0.4 mg/dL (ref 0.3–1.2)
BUN: 13 mg/dL (ref 6–23)
CO2: 24 mmol/L (ref 19–32)
Calcium: 9.7 mg/dL (ref 8.4–10.5)
Chloride: 105 mmol/L (ref 96–112)
Creatinine, Ser: 0.93 mg/dL (ref 0.50–1.10)
GFR, EST AFRICAN AMERICAN: 73 mL/min — AB (ref >90)
GFR, EST NON AFRICAN AMERICAN: 63 mL/min — AB (ref >90)
Glucose, Bld: 151 mg/dL — ABNORMAL HIGH (ref 70–99)
Potassium: 4.3 mmol/L (ref 3.5–5.1)
Sodium: 139 mmol/L (ref 135–145)
Total Protein: 7 g/dL (ref 6.0–8.3)

## 2014-04-18 LAB — URINALYSIS, ROUTINE W REFLEX MICROSCOPIC
Bilirubin Urine: NEGATIVE
Glucose, UA: NEGATIVE mg/dL
Hgb urine dipstick: NEGATIVE
KETONES UR: NEGATIVE mg/dL
Leukocytes, UA: NEGATIVE
Nitrite: NEGATIVE
PH: 6 (ref 5.0–8.0)
PROTEIN: NEGATIVE mg/dL
Specific Gravity, Urine: 1.01 (ref 1.005–1.030)
Urobilinogen, UA: 0.2 mg/dL (ref 0.0–1.0)

## 2014-04-18 LAB — MRSA PCR SCREENING: MRSA by PCR: POSITIVE — AB

## 2014-04-18 LAB — PROTIME-INR
INR: 1.02 (ref 0.00–1.49)
Prothrombin Time: 13.6 seconds (ref 11.6–15.2)

## 2014-04-18 LAB — I-STAT CHEM 8, ED
BUN: 16 mg/dL (ref 6–23)
CREATININE: 0.9 mg/dL (ref 0.50–1.10)
Calcium, Ion: 1.26 mmol/L (ref 1.13–1.30)
Chloride: 103 mmol/L (ref 96–112)
GLUCOSE: 155 mg/dL — AB (ref 70–99)
HCT: 52 % — ABNORMAL HIGH (ref 36.0–46.0)
HEMOGLOBIN: 17.7 g/dL — AB (ref 12.0–15.0)
POTASSIUM: 4.3 mmol/L (ref 3.5–5.1)
Sodium: 140 mmol/L (ref 135–145)
TCO2: 23 mmol/L (ref 0–100)

## 2014-04-18 LAB — PHOSPHORUS: Phosphorus: 2.9 mg/dL (ref 2.3–4.6)

## 2014-04-18 LAB — MAGNESIUM
MAGNESIUM: 1.8 mg/dL (ref 1.5–2.5)
Magnesium: 2 mg/dL (ref 1.5–2.5)

## 2014-04-18 LAB — I-STAT TROPONIN, ED: Troponin i, poc: 0 ng/mL (ref 0.00–0.08)

## 2014-04-18 LAB — CBG MONITORING, ED: Glucose-Capillary: 69 mg/dL — ABNORMAL LOW (ref 70–99)

## 2014-04-18 LAB — I-STAT CG4 LACTIC ACID, ED
Lactic Acid, Venous: 6.28 mmol/L (ref 0.5–2.0)
Lactic Acid, Venous: 3.24 mmol/L (ref 0.5–2.0)

## 2014-04-18 LAB — TROPONIN I: Troponin I: <0.03 ng/mL (ref <0.031)

## 2014-04-18 LAB — GLUCOSE, CAPILLARY
GLUCOSE-CAPILLARY: 90 mg/dL (ref 70–99)
GLUCOSE-CAPILLARY: 104 mg/dL — AB (ref 70–99)

## 2014-04-18 LAB — VALPROIC ACID LEVEL: Valproic Acid Lvl: 85.8 ug/mL (ref 50.0–100.0)

## 2014-04-18 LAB — SALICYLATE LEVEL: Salicylate Lvl: <4 mg/dL (ref 2.8–20.0)

## 2014-04-18 LAB — TSH: TSH: 0.03 u[IU]/mL — ABNORMAL LOW (ref 0.350–4.500)

## 2014-04-18 LAB — CK
CK TOTAL: 611 U/L — AB (ref 7–177)
Total CK: 2342 U/L — ABNORMAL HIGH (ref 7–177)

## 2014-04-18 LAB — BRAIN NATRIURETIC PEPTIDE: B Natriuretic Peptide: 146.4 pg/mL — ABNORMAL HIGH (ref 0.0–100.0)

## 2014-04-18 LAB — ACETAMINOPHEN LEVEL

## 2014-04-18 MED ORDER — BISACODYL 10 MG RE SUPP: 10.0000 mg | Freq: Every day | RECTAL | Status: DC | PRN

## 2014-04-18 MED ORDER — MIDAZOLAM HCL 2 MG/2ML IJ SOLN
INTRAMUSCULAR | Status: AC
  Administered 2014-04-18: 2 mg via INTRAVENOUS
  Filled 2014-04-18: qty 2

## 2014-04-18 MED ORDER — LEVOTHYROXINE SODIUM 175 MCG PO TABS
175.0000 ug | ORAL_TABLET | Freq: Every day | ORAL | Status: DC
  Administered 2014-04-19: 175 ug
  Filled 2014-04-18 (×2): qty 1

## 2014-04-18 MED ORDER — MIDAZOLAM HCL 2 MG/2ML IJ SOLN
INTRAMUSCULAR | Status: AC
  Filled 2014-04-18: qty 2

## 2014-04-18 MED ORDER — MIDAZOLAM HCL 2 MG/2ML IJ SOLN
INTRAMUSCULAR | Status: AC
  Filled 2014-04-18: qty 4

## 2014-04-18 MED ORDER — MIDAZOLAM HCL 2 MG/2ML IJ SOLN: 4.0000 mg | INTRAMUSCULAR | Status: DC

## 2014-04-18 MED ORDER — DILTIAZEM HCL 100 MG IV SOLR
5.0000 mg/h | INTRAVENOUS | Status: DC
  Administered 2014-04-18: 5 mg/h via INTRAVENOUS

## 2014-04-18 MED ORDER — SODIUM CHLORIDE 0.9 % IV SOLN
3.0000 g | Freq: Three times a day (TID) | INTRAVENOUS | Status: AC
  Administered 2014-04-19 – 2014-04-22 (×12): 3 g via INTRAVENOUS
  Filled 2014-04-18 (×12): qty 3

## 2014-04-18 MED ORDER — FENTANYL CITRATE 0.05 MG/ML IJ SOLN
50.0000 ug | INTRAMUSCULAR | Status: AC | PRN
  Administered 2014-04-18 (×3): 50 ug via INTRAVENOUS
  Filled 2014-04-18: qty 2

## 2014-04-18 MED ORDER — PIPERACILLIN-TAZOBACTAM 3.375 G IVPB 30 MIN
3.3750 g | INTRAVENOUS | Status: DC
  Filled 2014-04-18: qty 50

## 2014-04-18 MED ORDER — IOHEXOL 350 MG/ML SOLN
80.0000 mL | Freq: Once | INTRAVENOUS | Status: AC | PRN
  Administered 2014-04-18: 71 mL via INTRAVENOUS

## 2014-04-18 MED ORDER — PIPERACILLIN-TAZOBACTAM 3.375 G IVPB: 3.3750 g | Freq: Three times a day (TID) | INTRAVENOUS | Status: DC

## 2014-04-18 MED ORDER — ASPIRIN 300 MG RE SUPP: 300.0000 mg | RECTAL | Status: AC

## 2014-04-18 MED ORDER — PANTOPRAZOLE SODIUM 40 MG IV SOLR
40.0000 mg | Freq: Every day | INTRAVENOUS | Status: DC
  Administered 2014-04-19 – 2014-04-22 (×4): 40 mg via INTRAVENOUS
  Filled 2014-04-18 (×5): qty 40

## 2014-04-18 MED ORDER — FENTANYL CITRATE 0.05 MG/ML IJ SOLN
INTRAMUSCULAR | Status: AC
  Filled 2014-04-18: qty 2

## 2014-04-18 MED ORDER — SENNOSIDES 8.8 MG/5ML PO SYRP
5.0000 mL | ORAL_SOLUTION | Freq: Two times a day (BID) | ORAL | Status: DC | PRN
  Filled 2014-04-18: qty 5

## 2014-04-18 MED ORDER — BISACODYL 5 MG PO TBEC: 5.0000 mg | DELAYED_RELEASE_TABLET | Freq: Every day | ORAL | Status: DC | PRN

## 2014-04-18 MED ORDER — CETYLPYRIDINIUM CHLORIDE 0.05 % MT LIQD
7.0000 mL | Freq: Four times a day (QID) | OROMUCOSAL | Status: DC
  Administered 2014-04-19 – 2014-04-22 (×12): 7 mL via OROMUCOSAL

## 2014-04-18 MED ORDER — SODIUM CHLORIDE 0.9 % IV BOLUS (SEPSIS)
1000.0000 mL | INTRAVENOUS | Status: AC
  Administered 2014-04-18: 1000 mL via INTRAVENOUS

## 2014-04-18 MED ORDER — SODIUM CHLORIDE 0.9 % IV BOLUS (SEPSIS)
1000.0000 mL | Freq: Once | INTRAVENOUS | Status: AC
  Administered 2014-04-18: 1000 mL via INTRAVENOUS

## 2014-04-18 MED ORDER — SODIUM CHLORIDE 0.9 % IV BOLUS (SEPSIS)
500.0000 mL | Freq: Once | INTRAVENOUS | Status: AC
  Administered 2014-04-18: 500 mL via INTRAVENOUS

## 2014-04-18 MED ORDER — ASPIRIN 81 MG PO CHEW: 324.0000 mg | CHEWABLE_TABLET | ORAL | Status: AC

## 2014-04-18 MED ORDER — MIDAZOLAM HCL 2 MG/2ML IJ SOLN
INTRAMUSCULAR | Status: AC
Start: 2014-04-18 — End: 2014-04-18
  Filled 2014-04-18: qty 2

## 2014-04-18 MED ORDER — VALPROIC ACID 250 MG/5ML PO SYRP: 500.0000 mg | ORAL_SOLUTION | Freq: Two times a day (BID) | ORAL | Status: DC

## 2014-04-18 MED ORDER — VALPROIC ACID 250 MG/5ML PO SYRP
500.0000 mg | ORAL_SOLUTION | Freq: Every day | ORAL | Status: DC
  Filled 2014-04-18 (×2): qty 10

## 2014-04-18 MED ORDER — HEPARIN SODIUM (PORCINE) 5000 UNIT/ML IJ SOLN
5000.0000 [IU] | Freq: Three times a day (TID) | INTRAMUSCULAR | Status: DC
  Administered 2014-04-18 – 2014-04-22 (×11): 5000 [IU] via SUBCUTANEOUS
  Filled 2014-04-18 (×14): qty 1

## 2014-04-18 MED ORDER — INSULIN ASPART 100 UNIT/ML ~~LOC~~ SOLN
2.0000 [IU] | SUBCUTANEOUS | Status: DC
  Administered 2014-04-19 – 2014-04-21 (×3): 2 [IU] via SUBCUTANEOUS

## 2014-04-18 MED ORDER — SODIUM CHLORIDE 0.9 % IV SOLN
3.0000 g | INTRAVENOUS | Status: AC
  Administered 2014-04-18: 3 g via INTRAVENOUS
  Filled 2014-04-18: qty 3

## 2014-04-18 MED ORDER — VANCOMYCIN HCL 10 G IV SOLR
1250.0000 mg | INTRAVENOUS | Status: DC
  Filled 2014-04-18: qty 1250

## 2014-04-18 MED ORDER — DEXTROSE-NACL 5-0.45 % IV SOLN
INTRAVENOUS | Status: DC
  Administered 2014-04-18: 125 mL/h via INTRAVENOUS
  Administered 2014-04-19 – 2014-04-22 (×5): via INTRAVENOUS

## 2014-04-18 MED ORDER — VALPROIC ACID 250 MG/5ML PO SYRP
1000.0000 mg | ORAL_SOLUTION | Freq: Every day | ORAL | Status: DC
  Administered 2014-04-18: 1000 mg via ORAL
  Filled 2014-04-18 (×2): qty 20

## 2014-04-18 MED ORDER — MIDAZOLAM HCL 2 MG/2ML IJ SOLN
2.0000 mg | INTRAMUSCULAR | Status: DC | PRN
  Administered 2014-04-18: 1 mg via INTRAVENOUS

## 2014-04-18 MED ORDER — CHLORHEXIDINE GLUCONATE 0.12 % MT SOLN
15.0000 mL | Freq: Two times a day (BID) | OROMUCOSAL | Status: DC
  Administered 2014-04-18 – 2014-04-22 (×7): 15 mL via OROMUCOSAL
  Filled 2014-04-18 (×6): qty 15

## 2014-04-18 MED ORDER — HALOPERIDOL LACTATE 5 MG/ML IJ SOLN
2.0000 mg | INTRAMUSCULAR | Status: DC | PRN
  Administered 2014-04-18 – 2014-04-19 (×3): 2 mg via INTRAVENOUS
  Filled 2014-04-18 (×3): qty 1

## 2014-04-18 MED ORDER — VITAL HIGH PROTEIN PO LIQD
1000.0000 mL | ORAL | Status: DC
  Administered 2014-04-19: 1000 mL
  Filled 2014-04-18 (×5): qty 1000

## 2014-04-18 MED ORDER — MIDAZOLAM HCL 2 MG/2ML IJ SOLN
2.0000 mg | INTRAMUSCULAR | Status: DC | PRN
  Administered 2014-04-18 – 2014-04-20 (×12): 2 mg via INTRAVENOUS
  Filled 2014-04-18 (×12): qty 2

## 2014-04-18 MED ORDER — VANCOMYCIN HCL 10 G IV SOLR
1250.0000 mg | Freq: Two times a day (BID) | INTRAVENOUS | Status: DC
  Filled 2014-04-18: qty 1250

## 2014-04-18 MED ORDER — MIDAZOLAM HCL 2 MG/2ML IJ SOLN: 4.0000 mg | INTRAMUSCULAR | Status: DC | PRN

## 2014-04-18 MED ORDER — PRO-STAT SUGAR FREE PO LIQD
30.0000 mL | Freq: Two times a day (BID) | ORAL | Status: DC
  Administered 2014-04-19: 30 mL
  Filled 2014-04-18 (×5): qty 30

## 2014-04-18 MED ORDER — FENTANYL CITRATE 0.05 MG/ML IJ SOLN
50.0000 ug | INTRAMUSCULAR | Status: DC | PRN
  Administered 2014-04-18 – 2014-04-21 (×3): 50 ug via INTRAVENOUS
  Filled 2014-04-18 (×4): qty 2

## 2014-04-18 MED ORDER — FENTANYL CITRATE 0.05 MG/ML IJ SOLN
INTRAMUSCULAR | Status: AC
  Administered 2014-04-18: 50 ug via INTRAVENOUS
  Filled 2014-04-18: qty 2

## 2014-04-18 MED ORDER — FENTANYL CITRATE 0.05 MG/ML IJ SOLN
50.0000 ug | INTRAMUSCULAR | Status: DC | PRN
  Administered 2014-04-18 (×2): 50 ug via INTRAVENOUS

## 2014-04-18 MED ORDER — DILTIAZEM LOAD VIA INFUSION
20.0000 mg | Freq: Once | INTRAVENOUS | Status: AC
  Administered 2014-04-18: 20 mg via INTRAVENOUS
  Filled 2014-04-18: qty 20

## 2014-04-18 NOTE — ED Notes (Signed)
Critical Care paged re: d/c abx

## 2014-04-18 NOTE — ED Notes (Signed)
EEG being completed at this time

## 2014-04-18 NOTE — Progress Notes (Signed)
[facility] Progress Note Patient Name: Kelsey Hanna DOB: March 22, 1950 MRN: [id number]   Date of Service  04/18/2014  HPI/Events of Note  Called d/t patient anxiety in spite of Fentanyl IV PRN and BP = 94/67.   eICU Interventions  Will order: 1. 0.9 NaCl 500 mL IV over 30 minutes now.  2. Versed 2 mg IV Q 2 hours PRN anxiety.     Intervention Category Minor Interventions: Agitation / anxiety - evaluation and management  [name] 04/18/2014, 9:08 PM

## 2014-04-18 NOTE — ED Notes (Signed)
Last seen normal was last night.  Pt. S daughter spoke with her on the phone at 22:00 and pt. Was fine.  Pt. Was found this morning on the floor in her bedroom by staff of [employer].  Pt. Was oriented to her name en route, however her LOC progressively became worse.  Pt. Is unresponsive upon arrival to Trauma A.  SVT on the monitor.  Pt. Received 6mg  IV and 12mg  IV of Adenosine en route without any change in rate of 230s.  Pt. Also reported to Paramedics en route that both of her legs had pain.  No deformity noted or swelling.  Both legs arrived with splints intact.

## 2014-04-18 NOTE — ED Notes (Signed)
Lactic acid results given to Dr. Darl Householder

## 2014-04-18 NOTE — H&P (Signed)
PULMONARY / CRITICAL CARE MEDICINE   Name: Kelsey Hanna MRN: [id number] DOB: 03/19/1950    ADMISSION DATE:  04/18/2014 CONSULTATION DATE:  04/18/2014  REFERRING MD :  Ellender Hose   CHIEF COMPLAINT:    INITIAL PRESENTATION: 64 year old female with h/o bipolar disorder presented to the ED via EMS after being found on floor at home, last seen normal at 2200 on 2/29. En route to ED became unresponsive. On arrival to ED was intubated for airway protection, found to be in  SVT 230s. Converted w/ CCB, but hypotensive after. PCCM asked to admit.   STUDIES:  CT head 3/1 - chronic atrophic and ischemic changes without acute abnormality.  CT cervical spine 3/1 - multilevel degenerative changes without acute abnormality CTA chest 3/1 - no pulmonary embolus. Left lower lobe airspace consolidation concerning for aspiration. Small pleural effusions bilaterally. Mild right base atelectasis.  Left ankle x-ray 3/1 - chronic changes in talus. Generalized soft tissue swelling.    SIGNIFICANT EVENTS: Intubated 04/18/2014     HISTORY OF PRESENT ILLNESS:   64 year old female with h/o bipolar disorder presented to the ED via EMS after being found on floor at home by [facility] [facility] [employer] staff. Last seen normal per patient's daughter at 2200 on 2/29. Patient oriented to self in ambulance then progressively became more unresponsive. Upon arrival to ED, patient with SVT rate 230s - given Adenosine 6mg  and 12mg  without change in rate. Received a total of 40 mg diltiazem boluses followed by diltiazem infusion with a decrease in rate to 90s. Diltiazem stopped when became hypotensive. Patient intubated in ED, given 33ml/kg fluid bolus, and started on empiric antibiotics for possible sepsis    PAST MEDICAL HISTORY :   has a past medical history of Bipolar 1 disorder; Hypothyroid; Dyslipidemia; Retinal detachment; Sleep apnea; Diabetes mellitus; and Cancer.  has past surgical history that includes Abdominal hysterectomy; Total  abdominal hysterectomy w/ bilateral salpingoophorectomy; Tonsillectomy; and Lumbar laminectomy/decompression microdiscectomy (07/17/2011). Prior to Admission medications   Medication Sig Start Date End Date Taking? Authorizing Provider  atorvastatin (LIPITOR) 40 MG tablet Take 40 mg by mouth every evening.     Historical Provider, MD  clindamycin (CLEOCIN) 150 MG capsule Take 3 capsules (450 mg total) by mouth 4 (four) times daily. 04/01/14   Harvie Heck, PA-C  colchicine 0.6 MG tablet Take 0.6 mg by mouth daily.    Historical Provider, MD  cyclobenzaprine (FLEXERIL) 10 MG tablet Take 10 mg by mouth 3 (three) times daily.    Historical Provider, MD  divalproex (DEPAKOTE) 500 MG DR tablet Take 500-1,000 mg by mouth 2 (two) times daily. Take 1 tablet in the morning and 2 tablets in the evening    Historical Provider, MD  furosemide (LASIX) 80 MG tablet Take 80 mg by mouth daily.    Historical Provider, MD  gabapentin (NEURONTIN) 300 MG capsule Take 300 mg by mouth 3 (three) times daily.    Historical Provider, MD  hydrochlorothiazide (HYDRODIURIL) 25 MG tablet Take 25 mg by mouth every morning.    Historical Provider, MD  HYDROcodone-acetaminophen (NORCO/VICODIN) 5-325 MG per tablet Take 1 tablet by mouth every 6 (six) hours as needed for moderate pain or severe pain. 04/01/14   Harvie Heck, PA-C  levothyroxine (SYNTHROID, LEVOTHROID) 175 MCG tablet Take 175 mcg by mouth daily before breakfast.    Historical Provider, MD  lidocaine (XYLOCAINE) 5 % ointment Apply 1 application topically daily. Apply to ankles and heels 06/29/13   Historical Provider,  MD  metFORMIN (GLUCOPHAGE) 500 MG tablet Take 500 mg by mouth 2 (two) times daily with a meal.    Historical Provider, MD  oxyCODONE-acetaminophen (PERCOCET) 10-325 MG per tablet Take 1 tablet by mouth 4 (four) times daily.    Historical Provider, MD  potassium chloride (K-DUR) 10 MEQ tablet Take 10 mEq by mouth every morning.     Historical Provider, MD   VOLTAREN 1 % GEL Apply 1 application topically daily. 06/29/13   Historical Provider, MD  zolpidem (AMBIEN) 10 MG tablet Take 10 mg by mouth at bedtime.     Historical Provider, MD   Allergies  Allergen Reactions  . Lithium Other (See Comments)    "makes me feel spaced out" slurred speech    FAMILY HISTORY:  has no family status information on file.  SOCIAL HISTORY:  reports that she quit smoking about 14 years ago. Her smoking use included Cigarettes. She has a 70 pack-year smoking history. She does not have any smokeless tobacco history on file. She reports that she does not drink alcohol or use illicit drugs.  REVIEW OF SYSTEMS: unable to assess.   SUBJECTIVE: unable to assess.   VITAL SIGNS: Pulse Rate:  [74-98] 98 (03/01 1404) Resp:  [14-22] 22 (03/01 1510) BP: (84-133)/(42-109) 92/58 mmHg (03/01 1510) SpO2:  [78 %] 78 % (03/01 1155) FiO2 (%):  [50 %-100 %] 50 % (03/01 1236) Weight:  [86.183 kg (190 lb)-97.523 kg (215 lb)] 97.523 kg (215 lb) (03/01 1242) HEMODYNAMICS:   VENTILATOR SETTINGS: Vent Mode:  [-] PRVC FiO2 (%):  [50 %-100 %] 50 % Set Rate:  [16 bmp-22 bmp] 22 bmp Vt Set:  [490 mL] 490 mL PEEP:  [5 cmH20] 5 cmH20 Plateau Pressure:  [14 cmH20] 14 cmH20 INTAKE / OUTPUT:  Intake/Output Summary (Last 24 hours) at 04/18/14 1523 Last data filed at 04/18/14 1452  Gross per 24 hour  Intake      0 ml  Output    925 ml  Net   -925 ml    PHYSICAL EXAMINATION: General:  Chronically ill-appearing female, sedated.  Neuro:  Movement of BUE noted to pain, R>L  HEENT:  jvd low Cardiovascular:  S1S2, no MGR. Distal pulses intact.  Lungs:  Scattered rhonchi throughout.  Abdomen:  Soft, bowel sounds present.  Musculoskeletal: left ankle splinted.  Skin:  BUE mottled. BLE with chronic edema.   LABS:  CBC  Recent Labs Lab 04/18/14 1148 04/18/14 1151  WBC 12.8*  --   HGB 15.0 17.7*  HCT 47.4* 52.0*  PLT 283  --    Coag's  Recent Labs Lab 04/18/14 1148   INR 1.02   BMET  Recent Labs Lab 04/18/14 1148 04/18/14 1151  NA 139 140  K 4.3 4.3  CL 105 103  CO2 24  --   BUN 13 16  CREATININE 0.93 0.90  GLUCOSE 151* 155*   Electrolytes  Recent Labs Lab 04/18/14 1148  CALCIUM 9.7  MG 2.0   Sepsis Markers  Recent Labs Lab 04/18/14 1450  LATICACIDVEN 6.28*   ABG  Recent Labs Lab 04/18/14 1229  PHART 7.253*  PCO2ART 57.9*  PO2ART 332.0*   Liver Enzymes  Recent Labs Lab 04/18/14 1148  AST 26  ALT 17  ALKPHOS 62  BILITOT 0.4  ALBUMIN 3.6   Cardiac Enzymes  Recent Labs Lab 04/18/14 1148  TROPONINI <0.03   Glucose No results for input(s): GLUCAP in the last 168 hours.  Imaging No results found.   ASSESSMENT /  PLAN:  PULMONARY OETT 04/18/2014  A: Acute  hypercarbic respiratory failure: etiology unclear. D-dx: pharmacological vs also consider post-ictal. LLL airspace disease: presume aspiration PNA. Doubt this was cause of decompensation.  P:   44ml/kg TV; adjusted Ve; f/u ABG VAP prophylaxis  See ID section  CARDIOVASCULAR A:  SVT - resolved SIRS vs sepsis w/ elevated lactic acidosis  Hypotension   P:  Diltiazem infusion stopped.  D/c propofol. Cycle cardiac enzymes  Volume resuscitation-->30 ml/kg bolus then repeat lactic acid  RENAL A:  No active issues P:   Trend chem.  Trend CK -->would be at risk for rhabdo if indeed seizure related.   GASTROINTESTINAL A:  No active issues  P:   Stress ulcer prophylaxis - famotidine  Start tubefeeds  HEMATOLOGIC A:   Polycythemia  P:  Trend CBC DVT prophylaxis - Arthur heparin   INFECTIOUS A:   SIRS/sepsis  Aspiration PNA  Recent LLE cellulitis  P:  Trend procalcitonin.  BCx2 3/1 >> Sputum 3/1>>> Abx:  unasyn 3/1>>> vanc 3/1>>>  ENDOCRINE A:  hyperglycemia   H/o hypothyroidism P:   Trend CBG SSI  Evaluate TSH Continue home levothyroxine.   NEUROLOGIC A:   Acute Encephalopathy of unknown cause. ? Seizure, ? Drug overdose ?   P:   RASS goal: -1 EEG to r/o seizures Send tox screen.   Muscular skeletal  A: Chronic Left ankle fracture w/ malformation  P: Supportive   FAMILY  - Updates:   - Inter-disciplinary family meet or Palliative Care meeting due by: 3/8    TODAY'S SUMMARY:  64 year old female with h/o bipolar disorder now intubated in ED for decreased LOC. Did have SVT this has since converted. Etiology of her encephalopathy is unclear. Concerned that this may be medication related, also consider other toxic overdose or even possible seizure and post-ictal state. Also recently has cellulitis of LLE and is on clinda. ? Sepsis in general??? Valproic acid level within normal range. Looks like she has  Had LLL aspiration event. Doubt this was etiology of her decompensation.   Pulmonary and [place] Pager: [phone]  04/18/2014, 3:23 PM   STAFF NOTE: I, Merrie Roof, MD FACP have personally reviewed patient's available data, including medical history, events of note, physical examination and test results as part of my evaluation. I have discussed with resident/NP and other care providers such as pharmacist, RN and RRT. In addition, I personally evaluated patient and elicited key findings of: Unclear etiology of being found down, ensure 30 cc/kg, lactic, tox screen, eeg for seizure focus, may need neuro input, empiric unasyn, vanc, abg to follow The patient is critically ill with multiple organ systems failure and requires high complexity decision making for assessment and support, frequent evaluation and titration of therapies, application of advanced monitoring technologies and extensive interpretation of multiple databases.   Critical Care Time devoted to patient care services described in this note is 30  Minutes. This time reflects time of care of this signee: Merrie Roof, MD FACP. This critical care time does not reflect procedure time, or teaching time or  supervisory time of PA/NP/Med student/Med Resident etc but could involve care discussion time. Rest per NP/medical resident whose note is outlined above and that I agree with   Lavon Paganini. Titus Mould, MD, [place] Pgr: [facility] 04/18/2014 4:16 PM

## 2014-04-18 NOTE — ED Notes (Signed)
Critical Care repaged

## 2014-04-18 NOTE — ED Notes (Signed)
  Updated pt. On plan of care 

## 2014-04-18 NOTE — Progress Notes (Addendum)
ANTIBIOTIC CONSULT NOTE - INITIAL  Pharmacy Consult for Unasyn Indication: pneumonia  Allergies  Allergen Reactions  . Lithium Other (See Comments)    "makes me feel spaced out" slurred speech    Patient Measurements: Height: 5\' 7"  (170.2 cm) Weight: 215 lb (97.523 kg) IBW/kg (Calculated) : 61.6 Adjusted Body Weight:    Vital Signs: BP: 92/58 mmHg (03/01 1510) Pulse Rate: 98 (03/01 1404) Intake/Output from previous day:   Intake/Output from this shift: Total I/O In: -  Out: 925 [Urine:925]  Labs:  Recent Labs  04/18/14 1148 04/18/14 1151  WBC 12.8*  --   HGB 15.0 17.7*  PLT 283  --   CREATININE 0.93 0.90   Estimated Creatinine Clearance: 74.8 mL/min (by C-G formula based on Cr of 0.9). No results for input(s): VANCOTROUGH, VANCOPEAK, VANCORANDOM, GENTTROUGH, GENTPEAK, GENTRANDOM, TOBRATROUGH, TOBRAPEAK, TOBRARND, AMIKACINPEAK, AMIKACINTROU, AMIKACIN in the last 72 hours.   Microbiology: Recent Results (from the past 720 hour(s))  Urine culture     Status: None   Collection Time: 04/01/14  8:49 PM  Result Value Ref Range Status   Specimen Description URINE, CLEAN CATCH  Final   Special Requests NONE  Final   Colony Count NO GROWTH Performed at [facility]   Final   Culture NO GROWTH Performed at [facility]   Final   Report Status 04/03/2014 FINAL  Final    Medical History: Past Medical History  Diagnosis Date  . Bipolar 1 disorder   . Hypothyroid   . Dyslipidemia   . Retinal detachment   . Sleep apnea   . Diabetes mellitus     dx within last yr....just takes pills  . Cancer     cancer of uterus...no chemo or radiation    Medications:  See med rec  Assessment: AMS 64 y/o F found down in her home with AMS. Found in SVT. BMET WNL (Scr 0.9), WBC 12.8, proBNP 146, CK 611, LA 6.28 elevated.  Goal of Therapy:  Vancomycin trough level 15-20 mcg/ml  Plan:  Change Vanco/Zosyn to Unasyn 3g IV q8hr    Geraldyne Barraclough S. Alford Highland,  PharmD, BCPS Clinical Staff Pharmacist Pager [phone]  Eilene Ghazi Stillinger 04/18/2014,3:20 PM

## 2014-04-18 NOTE — ED Notes (Signed)
Kelsey Hanna, pts daughter & POA states, "she is to be resuscitated if something happens. I have a living will at home I have to go get."

## 2014-04-18 NOTE — Progress Notes (Signed)
EEG Completed; Results Pending  

## 2014-04-18 NOTE — Progress Notes (Signed)
[place] Progress Note Patient Name: CHUNDRA SAUERWEIN DOB: April 01, 1950 MRN: [id number]   Date of Service  04/18/2014  HPI/Events of Note  Review of Chest CT Angio r/o PE - 04/18/2014: No demonstrable pulmonary embolus.  Areas of airspace consolidation in the left lower lobe. Given the clinical history, aspiration must be of concern.  Small pleural effusions bilaterally. Mild right base atelectasis.  No adenopathy. Mild underlying emphysematous change. Old healed rib fractures on the left. Patient intubated with endotracheal tube tip above the carina.  eICU Interventions  Patient is currently on Unasyn which should cover to possible aspiration.      Intervention Category Intermediate Interventions: Diagnostic test evaluation  Lysle Dingwall 04/18/2014, 10:31 PM

## 2014-04-18 NOTE — Procedures (Signed)
ELECTROENCEPHALOGRAM REPORT  Patient: [name] #: [id number] EEG No. ID: [id number] Age: 64 y.o.        Sex: female Referring Physician: Titus Mould, D Report Date:  04/18/2014        Interpreting Physician: Anthony Sar  History: Kelsey Hanna is an 64 y.o. female with bipolar disorder intubated following onset of reduced level of consciousness and responsiveness. Etiology for patient's encephalopathic state is unclear.  Indications for study:  Assess severity of encephalopathy; rule out seizure activity.  Technique: This is an 18 channel routine scalp EEG performed at the bedside with bipolar and monopolar montages arranged in accordance to the international 10/20 system of electrode placement.   Description: Patient was intubated and on mechanical ventilation at the time of this EEG recording. She was responsive to verbal commands with eye opening. Predominant cerebral activity consisted of low amplitude diffuse 1-2 Hz symmetrical delta activity with superimposed 6-7 Hz diffuse theta activity. Photic stimulation and hyperventilation were not performed. No epileptiform discharges were recorded. There were no areas of disproportionate slowing of cerebral activity.  Interpretation: This EEG is abnormal with mild generalized continuous nonspecific slowing of cerebral activity. This pattern of slowing can be seen with metabolic as well as toxic etiology. No evidence of an epileptic disorder was demonstrated.   Rush Farmer M.D. [facility] [phone]

## 2014-04-18 NOTE — ED Notes (Signed)
Pt accompanied to CT scan with RN & RT

## 2014-04-18 NOTE — Progress Notes (Addendum)
[place] Progress Note Patient Name: Kelsey Hanna DOB: Jan 25, 1951 MRN: [id number]   Date of Service  04/18/2014  HPI/Events of Note  Remains anxious and agitated in spite of present Rx. BP =   Sats= 100% and HR = 98.  eICU Interventions  Will add Haldol 2 mg IV Q 4 hours PRN agitation/anxiety.      Intervention Category Minor Interventions: Agitation / anxiety - evaluation and management  Lysle Dingwall 04/18/2014, 9:51 PM

## 2014-04-18 NOTE — Progress Notes (Signed)
[place] Progress Note Patient Name: Kelsey Hanna DOB: 1950/02/24 MRN: [id number]   Date of Service  04/18/2014  HPI/Events of Note  Review of Head and C-spine CT Scans reveals: CT of the head: Chronic atrophic and ischemic changes without acute abnormality.  CT of the cervical spine: Multilevel degenerative change without acute abnormality.  eICU Interventions  Continue present management.     Intervention Category Intermediate Interventions: Diagnostic test evaluation  Aolanis Crispen Cornelia Copa 04/18/2014, 10:35 PM

## 2014-04-18 NOTE — ED Provider Notes (Signed)
CSN: [id number]     Arrival date & time 04/18/14  1126 History   First MD Initiated Contact with Patient 04/18/14 1147     Chief Complaint  Patient presents with  . Altered Mental Status    unresponsive/ SVT     (Consider location/radiation/quality/duration/timing/severity/associated sxs/prior Treatment) HPI  64 year old female with past medical history of bipolar disorder, hypothyroidism, hyperlipidemia, and diabetes who presents via EMS with altered mental status. Per EMS report, the patient was last seen normal last night at which time she spoke with her daughter at approximately 2200. The patient was then found down on her floor by the staff of [facility] this morning. She was confused and oriented to name only but has had progressive worsening of mental status. EMS was called to the scene and states the patient's mental status has deteriorated and she is now minimally responsive. She was placed on a nonrebreather. She was noted to have a narrow complex tachycardia to the rate of 220-230 by EMS and she received 6 and 12 mg of adenosine without any improvement. On arrival, patient is unresponsive.  Level 5 Exception: AMS, critical illness  Past Medical History  Diagnosis Date  . Bipolar 1 disorder   . Hypothyroid   . Dyslipidemia   . Retinal detachment   . Sleep apnea   . Diabetes mellitus     dx within last yr....just takes pills  . Cancer     cancer of uterus...no chemo or radiation   Past Surgical History  Procedure Laterality Date  . Abdominal hysterectomy    . Total abdominal hysterectomy w/ bilateral salpingoophorectomy    . Tonsillectomy    . Lumbar laminectomy/decompression microdiscectomy  07/17/2011    Procedure: LUMBAR LAMINECTOMY/DECOMPRESSION MICRODISCECTOMY;  Surgeon: Sinclair Ship, MD;  Location: [place];  Service: Orthopedics;  Laterality: Left;  Left sided lumbar 4-5 microdisectomy   No family history on file. History  Substance Use Topics  .  Smoking status: Former Smoker -- 2.00 packs/day for 35 years    Types: Cigarettes    Quit date: 07/09/1999  . Smokeless tobacco: Not on file  . Alcohol Use: No     Comment: socially   OB History    No data available     Review of Systems  Unable to perform ROS: Patient unresponsive      Allergies  Lithium  Home Medications   Prior to Admission medications   Medication Sig Start Date End Date Taking? Authorizing Provider  atorvastatin (LIPITOR) 40 MG tablet Take 40 mg by mouth every evening.     Historical Provider, MD  clindamycin (CLEOCIN) 150 MG capsule Take 3 capsules (450 mg total) by mouth 4 (four) times daily. 04/01/14   Harvie Heck, PA-C  colchicine 0.6 MG tablet Take 0.6 mg by mouth daily.    Historical Provider, MD  cyclobenzaprine (FLEXERIL) 10 MG tablet Take 10 mg by mouth 3 (three) times daily.    Historical Provider, MD  divalproex (DEPAKOTE) 500 MG DR tablet Take 500-1,000 mg by mouth 2 (two) times daily. Take 1 tablet in the morning and 2 tablets in the evening    Historical Provider, MD  furosemide (LASIX) 80 MG tablet Take 80 mg by mouth daily.    Historical Provider, MD  gabapentin (NEURONTIN) 300 MG capsule Take 300 mg by mouth 3 (three) times daily.    Historical Provider, MD  hydrochlorothiazide (HYDRODIURIL) 25 MG tablet Take 25 mg by mouth every morning.    Historical Provider,  MD  HYDROcodone-acetaminophen (NORCO/VICODIN) 5-325 MG per tablet Take 1 tablet by mouth every 6 (six) hours as needed for moderate pain or severe pain. 04/01/14   Harvie Heck, PA-C  levothyroxine (SYNTHROID, LEVOTHROID) 175 MCG tablet Take 175 mcg by mouth daily before breakfast.    Historical Provider, MD  lidocaine (XYLOCAINE) 5 % ointment Apply 1 application topically daily. Apply to ankles and heels 06/29/13   Historical Provider, MD  metFORMIN (GLUCOPHAGE) 500 MG tablet Take 500 mg by mouth 2 (two) times daily with a meal.    Historical Provider, MD  oxyCODONE-acetaminophen  (PERCOCET) 10-325 MG per tablet Take 1 tablet by mouth 4 (four) times daily.    Historical Provider, MD  potassium chloride (K-DUR) 10 MEQ tablet Take 10 mEq by mouth every morning.     Historical Provider, MD  VOLTAREN 1 % GEL Apply 1 application topically daily. 06/29/13   Historical Provider, MD  zolpidem (AMBIEN) 10 MG tablet Take 10 mg by mouth at bedtime.     Historical Provider, MD   BP 114/91 mmHg  Pulse 74  Resp 22  Ht 5\' 7"  (1.702 m)  Wt 215 lb (97.523 kg)  BMI 33.67 kg/m2  SpO2 78% Physical Exam  Constitutional: She appears lethargic. She has a sickly appearance. She appears ill. Face mask in place.  HENT:  Head: Normocephalic and atraumatic.  Mouth/Throat: No oropharyngeal exudate.  Eyes: Conjunctivae are normal. Pupils are equal, round, and reactive to light.  Neck: Normal range of motion. Neck supple.  Cardiovascular: Normal heart sounds and intact distal pulses.  An irregularly irregular rhythm present. Tachycardia present.  Exam reveals no friction rub.   No murmur heard. Pulmonary/Chest: Effort normal and breath sounds normal. No respiratory distress. She has no wheezes. She has no rales.  Abdominal: Soft. She exhibits no distension. There is no tenderness.  Musculoskeletal: She exhibits no edema.  Neurological: She appears lethargic. She is disoriented. GCS eye subscore is 3. GCS verbal subscore is 2. GCS motor subscore is 6.  CN limited 2/2 AMS. Face symmetric. PERRL. No nystagmus. MAE. Mild tremor noted throughout UE and LE.   Skin: Skin is warm. No rash noted.  Nursing note and vitals reviewed.   ED Course  INTUBATION Date/Time: 04/18/2014 8:08 PM Performed by: Duffy Bruce Authorized by: Duffy Bruce Consent: The procedure was performed in an emergent situation. Required items: required blood products, implants, devices, and special equipment available Time out: Immediately prior to procedure a "time out" was called to verify the correct patient,  procedure, equipment, support staff and site/side marked as required. Indications: respiratory distress,  respiratory failure and  airway protection Intubation method: video-assisted Patient status: paralyzed (RSI) Preoxygenation: nonrebreather mask and BVM Sedatives: etomidate Paralytic: rocuronium Laryngoscope size: Glidescope 3. Tube size: 7.5 mm Number of attempts: 1 Cricoid pressure: yes Cords visualized: yes Post-procedure assessment: chest rise,  ETCO2 monitor and CO2 detector Breath sounds: equal Cuff inflated: yes Tube secured with: ETT holder Chest x-ray interpreted by me, other physician and radiologist. Chest x-ray findings: endotracheal tube too low Tube repositioned: tube repositioned successfully Patient tolerance: Patient tolerated the procedure well with no immediate complications   (including critical care time)  Labs Review Labs Reviewed  CBC WITH DIFFERENTIAL/PLATELET - Abnormal; Notable for the following:    WBC 12.8 (*)    HCT 47.4 (*)    Neutro Abs 9.1 (*)    Monocytes Relative 13 (*)    Monocytes Absolute 1.6 (*)    All other components  within normal limits  COMPREHENSIVE METABOLIC PANEL - Abnormal; Notable for the following:    Glucose, Bld 151 (*)    GFR calc non Af Amer 63 (*)    GFR calc Af Amer 73 (*)    All other components within normal limits  BRAIN NATRIURETIC PEPTIDE - Abnormal; Notable for the following:    B Natriuretic Peptide 146.4 (*)    All other components within normal limits  ACETAMINOPHEN LEVEL - Abnormal; Notable for the following:    Acetaminophen (Tylenol), Serum <10.0 (*)    All other components within normal limits  CK - Abnormal; Notable for the following:    Total CK 611 (*)    All other components within normal limits  I-STAT CHEM 8, ED - Abnormal; Notable for the following:    Glucose, Bld 155 (*)    Hemoglobin 17.7 (*)    HCT 52.0 (*)    All other components within normal limits  I-STAT ARTERIAL BLOOD GAS, ED -  Abnormal; Notable for the following:    pH, Arterial 7.253 (*)    pCO2 arterial 57.9 (*)    pO2, Arterial 332.0 (*)    Bicarbonate 25.6 (*)    Acid-base deficit 3.0 (*)    All other components within normal limits  TROPONIN I  SALICYLATE LEVEL  URINALYSIS, ROUTINE W REFLEX MICROSCOPIC  MAGNESIUM  PROTIME-INR  TSH  VALPROIC ACID LEVEL  I-STAT TROPOININ, ED  I-STAT CG4 LACTIC ACID, ED    Imaging Review Dg Ankle Complete Left  04/18/2014   CLINICAL DATA:  Found unresponsive with redness surrounding the ankle and swelling  EXAM: LEFT ANKLE COMPLETE - 3+ VIEW  COMPARISON:  10/17/2004, 04/19/2011  FINDINGS: There are changes consistent with the large osteochondral defect an subchondral fractures in the talus seen on prior MRI examination. No definitive fracture in the talus or calcaneus is noted. Generalized soft tissue swelling is seen. No other bony abnormality is noted.  IMPRESSION: Chronic changes in the talus similar to that seen on the prior MRI  Generalized soft tissue swelling is noted.   Electronically Signed   By: Inez Catalina M.D.   On: 04/18/2014 12:53   Dg Chest Portable 1 View  04/18/2014   CLINICAL DATA:  Unresponsive, status post endotracheal intubation  EXAM: PORTABLE CHEST - 1 VIEW  COMPARISON:  04/01/2014  FINDINGS: Cardiac shadow is stable. An endotracheal tube is noted at the level of the carina and should be withdrawn 2 cm. The lungs are clear. No acute bony abnormality is noted.  IMPRESSION: No acute abnormality is noted. The endotracheal tube is at the level of the carina and should be withdrawn 2 cm.  These results were called by telephone at the time of interpretation on 04/18/2014 at 12:39 pm to Dr. Shirlyn Goltz , who verbally acknowledged these results.   Electronically Signed   By: Inez Catalina M.D.   On: 04/18/2014 12:40     EKG Interpretation   Date/Time:  Tuesday April 18 2014 11:29:14 EST Ventricular Rate:  230 PR Interval:    QRS Duration: 94 QT Interval:   255 QTC Calculation: 499 R Axis:   -85 Text Interpretation:  Atrial fibrillation with rapid V-rate Ventricular  premature complex Left anterior fascicular block ST depression, probably  rate related afib new since previous Confirmed by YAO  MD, DAVID ([id number])  on 04/18/2014 12:01:06 PM       Angiocath insertion Performed by: Duffy Bruce  Consent: Verbal consent obtained. Risks and benefits:  risks, benefits and alternatives were discussed Time out: Immediately prior to procedure a "time out" was called to verify the correct patient, procedure, equipment, support staff and site/side marked as required.  Preparation: Patient was prepped and draped in the usual sterile fashion.  Vein Location: Left brachial  Ultrasound Guided  Gauge: 20  Normal blood return and flush without difficulty Patient tolerance: Patient tolerated the procedure well with no immediate complications.  MDM   65 year old female with past medical history of bipolar disorder, hypothyroidism, hyperlipidemia, and diabetes who presents via EMS with altered mental status. Pt with HR 230s per EMS, s/p adenosine 6 mg then 12 mg without improvement. On arrival, airway guarded with poor GCS, but satting well. Breath sounds equal BL. Left EJ placed by Dr. Darl Householder at bedside. BP 585I systoilic/80s, but with HR 180s. Pulses weak but symmetric. GCS 9-10. EKG suggestive of AFIb with RVR.  Myself and Dr. Darl Householder immediately at bedside. Diltiazem 10 mg x 2 then 20 mg given with improvement in HR to 140s, BP stable to 120s. GCS remains poor and pt subsequently intubated for airway protection as above. Second PIV placed by myself at bedside under U/S guidance.  DDx is broad. Primary consideration includes PE, primary AFib with RVR, ACS, medication effect, acute renal failure with acidosis, hyperK, or other metabolic derangement. No PTX on CXR; ET tube withdrawn 2 cm per Radiology. No obvious PNA on CXR. Pt AF, but sepsis must also be  considered. Will give gentle fluids, continue dilt gtt, and send for stat CT Head and CT Angio. Broad labs and cultures sent.   CBC with WBC 12.8, o/w unremarkable. CMP largely normal. Normal LFTs. BNP minimally elevated. Troponin negative. Tox labs unremarkable with neg APAP, salicylate. CK mildly elevated at 611, will continue IVF. CT head and Angio pending. HR improving on dilt, now 120s.  HR now 90s with conversion to NSR on dilt gtt. Will hold dilt gtt given BP 77-824M systolic and conversion. CT Head negative. CTA PE neg for PE but c/f PNA. WIll start broad spectrum ABX. Lactate 6.3, likely 2/2 poor perfusion in setting of AFib with RVR but will continue fluids and trend. TSH pending. Critical care has been consulted and will admit. VSS. Family updated at bedside.  Clinical Impression: 1. Rapid atrial fibrillation   2. Shortness of breath   3. Altered mental status, unspecified altered mental status type   4. Acute respiratory failure     Disposition: Admit  Condition: Critical  Pt seen in conjunction with Dr. Geraldo Pitter, MD 04/18/14 3536  Duffy Bruce, MD 04/18/14 1443  Wandra Arthurs, MD 04/20/14 [phone]

## 2014-04-19 ENCOUNTER — Inpatient Hospital Stay (HOSPITAL_COMMUNITY): Payer: Medicare Other

## 2014-04-19 DIAGNOSIS — R4182 Altered mental status, unspecified: Secondary | ICD-10-CM

## 2014-04-19 LAB — GLUCOSE, CAPILLARY
GLUCOSE-CAPILLARY: 108 mg/dL — AB (ref 70–99)
GLUCOSE-CAPILLARY: 126 mg/dL — AB (ref 70–99)
GLUCOSE-CAPILLARY: 21 mg/dL — AB (ref 70–99)
Glucose-Capillary: 53 mg/dL — ABNORMAL LOW (ref 70–99)
Glucose-Capillary: 75 mg/dL (ref 70–99)
Glucose-Capillary: 88 mg/dL (ref 70–99)
Glucose-Capillary: 117 mg/dL — ABNORMAL HIGH (ref 70–99)
Glucose-Capillary: 123 mg/dL — ABNORMAL HIGH (ref 70–99)

## 2014-04-19 LAB — POCT I-STAT 3, ART BLOOD GAS (G3+)
Acid-base deficit: 3 mmol/L — ABNORMAL HIGH (ref 0.0–2.0)
BICARBONATE: 23.3 meq/L (ref 20.0–24.0)
O2 SAT: 98 %
PO2 ART: 112 mmHg — AB (ref 80.0–100.0)
TCO2: 25 mmol/L (ref 0–100)
pCO2 arterial: 46 mmHg — ABNORMAL HIGH (ref 35.0–45.0)
pH, Arterial: 7.312 — ABNORMAL LOW (ref 7.350–7.450)

## 2014-04-19 LAB — BASIC METABOLIC PANEL
Anion gap: 4 — ABNORMAL LOW (ref 5–15)
BUN: 7 mg/dL (ref 6–23)
CHLORIDE: 113 mmol/L — AB (ref 96–112)
CO2: 23 mmol/L (ref 19–32)
Calcium: 8.5 mg/dL (ref 8.4–10.5)
Creatinine, Ser: 0.61 mg/dL (ref 0.50–1.10)
GFR calc Af Amer: >90 mL/min (ref >90)
GFR calc non Af Amer: >90 mL/min (ref >90)
GLUCOSE: 110 mg/dL — AB (ref 70–99)
POTASSIUM: 3.8 mmol/L (ref 3.5–5.1)
Sodium: 140 mmol/L (ref 135–145)

## 2014-04-19 LAB — CK
CK TOTAL: 3741 U/L — AB (ref 7–177)
CK TOTAL: 3912 U/L — AB (ref 7–177)

## 2014-04-19 LAB — CBC
HCT: 34.9 % — ABNORMAL LOW (ref 36.0–46.0)
Hemoglobin: 11.3 g/dL — ABNORMAL LOW (ref 12.0–15.0)
MCH: 30.5 pg (ref 26.0–34.0)
MCHC: 32.4 g/dL (ref 30.0–36.0)
MCV: 94.3 fL (ref 78.0–100.0)
PLATELETS: 193 10*3/uL (ref 150–400)
RBC: 3.7 MIL/uL — ABNORMAL LOW (ref 3.87–5.11)
RDW: 13.9 % (ref 11.5–15.5)
WBC: 8.8 10*3/uL (ref 4.0–10.5)

## 2014-04-19 LAB — PHOSPHORUS
PHOSPHORUS: 2.1 mg/dL — AB (ref 2.3–4.6)
Phosphorus: 4.9 mg/dL — ABNORMAL HIGH (ref 2.3–4.6)

## 2014-04-19 LAB — MAGNESIUM
Magnesium: 1.8 mg/dL (ref 1.5–2.5)
Magnesium: 2 mg/dL (ref 1.5–2.5)

## 2014-04-19 LAB — PROCALCITONIN: Procalcitonin: <0.1 ng/mL

## 2014-04-19 MED ORDER — MAGNESIUM SULFATE 2 GM/50ML IV SOLN
2.0000 g | Freq: Once | INTRAVENOUS | Status: AC
  Administered 2014-04-19: 2 g via INTRAVENOUS
  Filled 2014-04-19: qty 50

## 2014-04-19 MED ORDER — POTASSIUM PHOSPHATES 15 MMOLE/5ML IV SOLN
10.0000 mmol | Freq: Once | INTRAVENOUS | Status: AC
  Administered 2014-04-19: 10 mmol via INTRAVENOUS
  Filled 2014-04-19: qty 3.33

## 2014-04-19 MED ORDER — VALPROATE SODIUM 500 MG/5ML IV SOLN
500.0000 mg | Freq: Every day | INTRAVENOUS | Status: DC
  Administered 2014-04-19 – 2014-04-22 (×4): 500 mg via INTRAVENOUS
  Filled 2014-04-19 (×4): qty 5

## 2014-04-19 MED ORDER — LEVOTHYROXINE SODIUM 100 MCG PO TABS
100.0000 ug | ORAL_TABLET | Freq: Every day | ORAL | Status: DC
  Filled 2014-04-19 (×2): qty 1

## 2014-04-19 MED ORDER — DEXMEDETOMIDINE HCL IN NACL 200 MCG/50ML IV SOLN
0.4000 ug/kg/h | INTRAVENOUS | Status: DC
  Administered 2014-04-19: 0.4 ug/kg/h via INTRAVENOUS
  Administered 2014-04-19: 0.9 ug/kg/h via INTRAVENOUS
  Administered 2014-04-19: 0.4 ug/kg/h via INTRAVENOUS
  Administered 2014-04-19: 0.7 ug/kg/h via INTRAVENOUS
  Administered 2014-04-20: 0.5 ug/kg/h via INTRAVENOUS
  Administered 2014-04-20: 0.7 ug/kg/h via INTRAVENOUS
  Administered 2014-04-20 (×2): 0.8 ug/kg/h via INTRAVENOUS
  Administered 2014-04-21: 0.5 ug/kg/h via INTRAVENOUS
  Filled 2014-04-19 (×12): qty 50

## 2014-04-19 MED ORDER — VALPROATE SODIUM 500 MG/5ML IV SOLN
1000.0000 mg | Freq: Every day | INTRAVENOUS | Status: DC
  Administered 2014-04-19 – 2014-04-21 (×3): 1000 mg via INTRAVENOUS
  Filled 2014-04-19 (×6): qty 10

## 2014-04-19 NOTE — Progress Notes (Signed)
Pt blood sugar was taken multiple times due to differing results. The blood sugar taken at 0431 was 75 previous blood sugar results were much lower. Pt's hands were cold during previous blood sugar attempts. Pt did not present with signs of hypoglycemia. MD Nelda Marseille was notified about situation. Pt also is receiving dextrose in fluids and also tube feedings. MD Nelda Marseille ordered to continue to monitor and assess patient.

## 2014-04-19 NOTE — Care Management Note (Signed)
  Page 2 of 2   04/21/2014     2:06:48 PM CARE MANAGEMENT NOTE 04/21/2014  Patient:  ADDELINE, CALARCO   Account Number:  [id number]  Date Initiated:  04/19/2014  Documentation initiated by:  [facility]  Subjective/Objective Assessment:   Admitted with AMS - found down, SVT - cardioverted, septic, intubated on arrival.     Action/Plan:   Anticipated DC Date:  04/26/2014   Anticipated DC Plan:  [place]  CM consult      [facility] Choice  Resumption Of Svcs/PTA Provider   Choice offered to / List presented to:          [facility] arranged  [place]      [facility] agency  [place]   Status of service:  In process, will continue to follow Medicare Important Message given?  YES (If response is "NO", the following Medicare IM given date fields will be blank) Date Medicare IM given:  04/21/2014 Medicare IM given by:  Elissa Hefty Date Additional Medicare IM given:   Additional Medicare IM given by:    Discharge Disposition:    Per UR Regulation:  Reviewed for med. necessity/level of care/duration of stay  If discussed at Long Length of Stay Meetings, dates discussed:    Comments:  Contact:  Felt,Michelle Daughter [phone] 3/4 1405 debbie Tanveer Dobberstein rn,bsn pt act w [employer] private duty for nsg assist at home. they will resume care at disch. Jonte tucker w [employer] to ck on hours that pt gets services.  04-19-14 4pm Luz Lex, RNBSN [phone] Talked with daughter on phone on above listed number. Patient has sitter, is restrained and agitated.  Patient lives in [place] apts.  These are apt for certain people that her case worker got her into from [facility].  They have bathroom pulls and initially checked her daily and assisted her with meds, but as she got better, she now does not have a daily person checking meds but 3x/week CNA with maybe a weekly nurse that comes in.  daughter feels that patient is not taking her meds  correctly.  Also states that has had increased pain in L foot/ankle and has needed increased pain meds and has been less mobile.  Would like PT her when able, aware that may need rehab prior to going back to this apt on her own.  Also would like to have a daily nurse or assistant that could help with her meds.  CM will consult with SW on any outside sources and possible rehab on discharge.  CM will continue to follow.

## 2014-04-19 NOTE — H&P (Signed)
PULMONARY / CRITICAL CARE MEDICINE   Name: Kelsey Hanna MRN: [id number] DOB: April 29, 1950    ADMISSION DATE:  04/18/2014 CONSULTATION DATE:  04/18/2014  REFERRING MD :  Ellender Hose   CHIEF COMPLAINT:    INITIAL PRESENTATION: 64 year old female with h/o bipolar disorder presented to the ED via EMS after being found on floor at home, last seen normal at 2200 on 2/29. En route to ED became unresponsive. On arrival to ED was intubated for airway protection, found to be in  SVT 230s. Converted w/ CCB, but hypotensive after. PCCM asked to admit.   STUDIES:  CT head 3/1 - chronic atrophic and ischemic changes without acute abnormality.  CT cervical spine 3/1 - multilevel degenerative changes without acute abnormality CTA chest 3/1 - no pulmonary embolus. Left lower lobe airspace consolidation concerning for aspiration. Small pleural effusions bilaterally. Mild right base atelectasis.  Left ankle x-ray 3/1 - chronic changes in talus. Generalized soft tissue swelling.    SIGNIFICANT EVENTS: Intubated 04/18/2014   3/2- improved neuro status  SUBJECTIVE: cam pos , but more awake  VITAL SIGNS: Temp:  [99.2 F (37.3 C)-101.2 F (38.4 C)] 101 F (38.3 C) (03/02 0830) Pulse Rate:  [41-117] 102 (03/02 0830) Resp:  [14-40] 20 (03/02 0830) BP: (71-133)/(24-109) 92/53 mmHg (03/02 0830) SpO2:  [78 %-100 %] 100 % (03/02 0830) FiO2 (%):  [50 %-100 %] 50 % (03/02 0821) Weight:  [85.821 kg (189 lb 3.2 oz)-97.523 kg (215 lb)] 85.821 kg (189 lb 3.2 oz) (03/02 0500) HEMODYNAMICS:   VENTILATOR SETTINGS: Vent Mode:  [-] PSV;CPAP FiO2 (%):  [50 %-100 %] 50 % Set Rate:  [16 bmp-22 bmp] 22 bmp Vt Set:  [490 mL] 490 mL PEEP:  [5 cmH20] 5 cmH20 Pressure Support:  [5 cmH20] 5 cmH20 Plateau Pressure:  [14 cmH20-17 cmH20] 17 cmH20 INTAKE / OUTPUT:  Intake/Output Summary (Last 24 hours) at 04/19/14 0848 Last data filed at 04/19/14 0800  Gross per 24 hour  Intake 2202.91 ml  Output   1375 ml  Net 827.91 ml     PHYSICAL EXAMINATION: General:  Chronically ill-appearing female, more awake Neuro:  Cam pos, follows commands, moves all ext equal HEENT:  jvd wnl Cardiovascular:  S1S2, no MGR. Distal pulses intact.  Lungs:  CTa anterior Abdomen:  Soft, bowel sounds present.  Musculoskeletal: left ankle splinted.  Skin:  BUE mottled. BLE with chronic edema.   LABS:  CBC  Recent Labs Lab 04/18/14 1148 04/18/14 1151  WBC 12.8*  --   HGB 15.0 17.7*  HCT 47.4* 52.0*  PLT 283  --    Coag's  Recent Labs Lab 04/18/14 1148  INR 1.02   BMET  Recent Labs Lab 04/18/14 1148 04/18/14 1151  NA 139 140  K 4.3 4.3  CL 105 103  CO2 24  --   BUN 13 16  CREATININE 0.93 0.90  GLUCOSE 151* 155*   Electrolytes  Recent Labs Lab 04/18/14 1148 04/18/14 1655 04/19/14 0300  CALCIUM 9.7  --   --   MG 2.0 1.8 1.8  PHOS  --  2.9 2.1*   Sepsis Markers  Recent Labs Lab 04/18/14 1450 04/18/14 1711  LATICACIDVEN 6.28* 3.24*   ABG  Recent Labs Lab 04/18/14 1229  PHART 7.253*  PCO2ART 57.9*  PO2ART 332.0*   Liver Enzymes  Recent Labs Lab 04/18/14 1148  AST 26  ALT 17  ALKPHOS 62  BILITOT 0.4  ALBUMIN 3.6   Cardiac Enzymes  Recent Labs Lab  04/18/14 1148  TROPONINI <0.03   Glucose  Recent Labs Lab 04/18/14 1823 04/18/14 1937 04/19/14 0005 04/19/14 0424 04/19/14 0429 04/19/14 0431  GLUCAP 90 104* 126* 53* 21* 75    Imaging Dg Ankle Complete Left  04/18/2014   CLINICAL DATA:  Found unresponsive with redness surrounding the ankle and swelling  EXAM: LEFT ANKLE COMPLETE - 3+ VIEW  COMPARISON:  10/17/2004, 04/19/2011  FINDINGS: There are changes consistent with the large osteochondral defect an subchondral fractures in the talus seen on prior MRI examination. No definitive fracture in the talus or calcaneus is noted. Generalized soft tissue swelling is seen. No other bony abnormality is noted.  IMPRESSION: Chronic changes in the talus similar to that seen on the  prior MRI  Generalized soft tissue swelling is noted.   Electronically Signed   By: Inez Catalina M.D.   On: 04/18/2014 12:53   Ct Head Wo Contrast  04/18/2014   CLINICAL DATA:  Found on floor, decreasing level of consciousness  EXAM: CT HEAD WITHOUT CONTRAST  CT CERVICAL SPINE WITHOUT CONTRAST  TECHNIQUE: Multidetector CT imaging of the head and cervical spine was performed following the standard protocol without intravenous contrast. Multiplanar CT image reconstructions of the cervical spine were also generated.  COMPARISON:  11/24/2013  FINDINGS: CT HEAD FINDINGS  The bony calvarium is intact. No gross soft tissue abnormality is noted. Atrophic changes are seen similar to that noted on the prior exam. Mild chronic ischemic changes are noted as well. A cavum septum pellucidum is.7 no findings to suggest acute hemorrhage, acute infarction or space-occupying mass lesion are noted.  CT CERVICAL SPINE FINDINGS  Seven cervical segments are well visualized. Vertebral body height is well maintained. Osteophytic changes are noted from C3-C7. No acute fracture or acute facet abnormality is noted. An endotracheal tube is noted in place. Some fluid is noted in the posterior oropharynx which may be related to the tube placement.  IMPRESSION: CT of the head: Chronic atrophic and ischemic changes without acute abnormality.  CT of the cervical spine: Multilevel degenerative change without acute abnormality.   Electronically Signed   By: Inez Catalina M.D.   On: 04/18/2014 14:31   Ct Angio Chest Pe W/cm &/or Wo Cm  04/18/2014   CLINICAL DATA:  Difficulty breathing and altered mental status  EXAM: CT ANGIOGRAPHY CHEST WITH CONTRAST  TECHNIQUE: Multidetector CT imaging of the chest was performed using the standard protocol during bolus administration of intravenous contrast. Multiplanar CT image reconstructions and MIPs were obtained to evaluate the vascular anatomy.  CONTRAST:  44mL OMNIPAQUE IOHEXOL 350 MG/ML SOLN  COMPARISON:   Chest radiograph April 18, 2014  FINDINGS: There is no demonstrable pulmonary embolus. There is no thoracic aortic aneurysm or dissection.  Endotracheal tube tip is above carina.  No pneumothorax.  There is underlying central emphysematous change. There is airspace consolidation in the superior segment of the left lower lobe. There is also airspace consolidation in portions the lateral and posterior segments of the left lower lobe. There are small pleural effusions bilaterally with mild right base atelectatic change.  There is no appreciable thoracic adenopathy. Pericardium is not thickened.  In the visualized upper abdomen, there is atherosclerotic change in the aorta. There are several old healed posterior left rib fractures. No blastic or lytic bone lesions are identified.  Review of the MIP images confirms the above findings.  IMPRESSION: No demonstrable pulmonary embolus.  Areas of airspace consolidation in the left lower lobe. Given the clinical  history, aspiration must be of concern.  Small pleural effusions bilaterally.  Mild right base atelectasis.  No adenopathy. Mild underlying emphysematous change. Old healed rib fractures on the left. Patient intubated with endotracheal tube tip above the carina.   Electronically Signed   By: Lowella Grip III M.D.   On: 04/18/2014 14:32   Ct Cervical Spine Wo Contrast  04/18/2014   CLINICAL DATA:  Found on floor, decreasing level of consciousness  EXAM: CT HEAD WITHOUT CONTRAST  CT CERVICAL SPINE WITHOUT CONTRAST  TECHNIQUE: Multidetector CT imaging of the head and cervical spine was performed following the standard protocol without intravenous contrast. Multiplanar CT image reconstructions of the cervical spine were also generated.  COMPARISON:  11/24/2013  FINDINGS: CT HEAD FINDINGS  The bony calvarium is intact. No gross soft tissue abnormality is noted. Atrophic changes are seen similar to that noted on the prior exam. Mild chronic ischemic changes are noted  as well. A cavum septum pellucidum is.7 no findings to suggest acute hemorrhage, acute infarction or space-occupying mass lesion are noted.  CT CERVICAL SPINE FINDINGS  Seven cervical segments are well visualized. Vertebral body height is well maintained. Osteophytic changes are noted from C3-C7. No acute fracture or acute facet abnormality is noted. An endotracheal tube is noted in place. Some fluid is noted in the posterior oropharynx which may be related to the tube placement.  IMPRESSION: CT of the head: Chronic atrophic and ischemic changes without acute abnormality.  CT of the cervical spine: Multilevel degenerative change without acute abnormality.   Electronically Signed   By: Inez Catalina M.D.   On: 04/18/2014 14:31   Dg Chest Portable 1 View  04/18/2014   CLINICAL DATA:  Unresponsive, status post endotracheal intubation  EXAM: PORTABLE CHEST - 1 VIEW  COMPARISON:  04/01/2014  FINDINGS: Cardiac shadow is stable. An endotracheal tube is noted at the level of the carina and should be withdrawn 2 cm. The lungs are clear. No acute bony abnormality is noted.  IMPRESSION: No acute abnormality is noted. The endotracheal tube is at the level of the carina and should be withdrawn 2 cm.  These results were called by telephone at the time of interpretation on 04/18/2014 at 12:39 pm to Dr. Shirlyn Goltz , who verbally acknowledged these results.   Electronically Signed   By: Inez Catalina M.D.   On: 04/18/2014 12:40   Dg Abd Portable 1v  04/18/2014   CLINICAL DATA:  Feeding tube placement  EXAM: PORTABLE ABDOMEN - 1 VIEW  COMPARISON:  None.  FINDINGS: There is a nasogastric tube with tip overlapping the proximal stomach, with side port near the GE junction.  Bowel gas pattern is nonobstructive.  Retrocardiac atelectasis, known from chest CT earlier the same day.  IMPRESSION: Nasogastric tube tip at the proximal stomach, with side port near the GE junction.   Electronically Signed   By: Monte Fantasia M.D.   On:  04/18/2014 23:05     ASSESSMENT / PLAN:  PULMONARY OETT 04/18/2014  A: Acute  hypercarbic respiratory failure: etiology unclear. D-dx: pharmacological vs also consider post-ictal. LLL airspace disease: presume aspiration PNA. Doubt this was cause of decompensation.  P:   60ml/kg TV; adjusted Ve; f/u ABG once back on rest if not extubated VAP prophylaxis  See ID section Wean this am noted, cpap 5 ps 5, goal 30 min , will require ABg assessment CT chest unimpressive  CARDIOVASCULAR A:  SVT - resolved SIRS vs sepsis w/ elevated lactic acidosis  P:  Allow pos balance Correct al lytes Tele maintain Echo assessment See endo, reduce synthroid  RENAL A:  Hypophos, hypomag P:   Chem in am replace phos once k or NA known  Mag supp to goal greater 2 Reduce volume slight  GASTROINTESTINAL A:  No active issues  P:   Stress ulcer prophylaxis - famotidine  Start tubefeeds if not extubated  HEMATOLOGIC A:   Polycythemia  P:  Trend CBC this am awaited DVT prophylaxis - Lithium heparin   INFECTIOUS A:   SIRS/sepsis  Aspiration PNA (unimpressed) Recent LLE cellulitis  P:  Trend procalcitonin to limit abx BCx2 3/1 >> Sputum 3/1>>> Abx:  unasyn 3/1>>> vanc 3/1>>>3/2  Maintain unasyn, pcxr in am   ENDOCRINE A:  hyperglycemia   H/o hypothyroidism P:   Trend CBG SSI  Evaluate TSH - .03 reduce synthroid   NEUROLOGIC A:   Acute Encephalopathy of unknown cause. ? Seizure, ? Drug overdose ?  P:   RASS goal: -1 EEG negative Send tox screen - reviewed, unsure if was on home benzo Continued to support Valproic wnl  Muscular skeletal  A: Chronic Left ankle fracture w/ malformation  P: Supportive   FAMILY  - Updates:   - Inter-disciplinary family meet or Palliative Care meeting due by: 3/8  Ccm time 30 min   Lavon Paganini. Titus Mould, MD, [place] Pgr: [facility]

## 2014-04-19 NOTE — Progress Notes (Signed)
[facility] Progress Note Patient Name: Kelsey Hanna DOB: 16-Apr-1950 MRN: [id number]   Date of Service  04/19/2014  HPI/Events of Note  Patient unable to void since foley removed earlier today. Bladder scan reveals a residual = 371 mL.   eICU Interventions  Will order I and O cath X 1.      Intervention Category Minor Interventions: Routine modifications to care plan (e.g. PRN medications for pain, fever)  Kelsey Hanna 04/19/2014, 8:52 PM

## 2014-04-19 NOTE — Progress Notes (Signed)
Nutrition Consult Brief Note  Received MD Consult for TF initiation and management. Patient was extubated this morning. TF off and no enteral access at this time. No plans to resume TF. No nutrition interventions indicated at this time. Please re-consult dietitian if needed.  Molli Barrows, RD, LDN, [place] Pager [phone] After Hours Pager [phone]

## 2014-04-19 NOTE — Progress Notes (Signed)
Pt became increasingly agitated trying to pull lines, ET tube, and trying to get out of bed. Given Fentanyl 50 mcg IV pushes, but patient still agitated. Notified MD Boone Master and ordered Versed 2mg  IV push.

## 2014-04-19 NOTE — Progress Notes (Addendum)
Pt still agitated, and trying to get out of bed. Notified MD Boone Master, and placed ordered for Haldol IV 2mg . Will continue to monitor and assess.

## 2014-04-19 NOTE — Progress Notes (Signed)
UR Completed.  [phone]  

## 2014-04-19 NOTE — Procedures (Signed)
Extubation Procedure Note  Patient Details:   Name: SHUNTEL FISHBURN DOB: 1951-02-02 MRN: [id number]   Airway Documentation:   patient had an audible leak prior to extubation.  Tolerated well. Good cough and talking. Placed on 6lpm [place] sat 97%.  Evaluation  O2 sats: stable throughout Complications: No apparent complications Patient did tolerate procedure well. Bilateral Breath Sounds: Clear, Diminished Suctioning: Airway Yes   Ned Grace 04/19/2014, 9:33 AM

## 2014-04-19 NOTE — Progress Notes (Signed)
Pt still agitated and notified MD for obtaining a safety sitter. MD Nelda Marseille placed order for sitter. Sitter now at bedside with patient.

## 2014-04-19 NOTE — Progress Notes (Signed)
[facility] Progress Note Patient Name: Kelsey Hanna DOB: 03/22/1950 MRN: [id number]   Date of Service  04/19/2014  HPI/Events of Note  Called d/t blood glucose = 123. Patient on Novolog SSI coverage. However is no longer on enteral nutrition.   eICU Interventions  Hold insulin coverage for blood glucose = 123.     Intervention Category Minor Interventions: Routine modifications to care plan (e.g. PRN medications for pain, fever)  Sommer,Steven Eugene 04/19/2014, 8:50 PM

## 2014-04-20 DIAGNOSIS — F319 Bipolar disorder, unspecified: Secondary | ICD-10-CM

## 2014-04-20 DIAGNOSIS — R0600 Dyspnea, unspecified: Secondary | ICD-10-CM

## 2014-04-20 LAB — PHOSPHORUS
Phosphorus: 4.4 mg/dL (ref 2.3–4.6)
Phosphorus: 4.4 mg/dL (ref 2.3–4.6)

## 2014-04-20 LAB — CBC WITH DIFFERENTIAL/PLATELET
BASOS ABS: 0 10*3/uL (ref 0.0–0.1)
BASOS PCT: 0 % (ref 0–1)
Eosinophils Absolute: 0.1 10*3/uL (ref 0.0–0.7)
Eosinophils Relative: 1 % (ref 0–5)
HEMATOCRIT: 38.6 % (ref 36.0–46.0)
Hemoglobin: 12.3 g/dL (ref 12.0–15.0)
Lymphocytes Relative: 26 % (ref 12–46)
Lymphs Abs: 2.1 10*3/uL (ref 0.7–4.0)
MCH: 30.1 pg (ref 26.0–34.0)
MCHC: 31.9 g/dL (ref 30.0–36.0)
MCV: 94.4 fL (ref 78.0–100.0)
Monocytes Absolute: 1 10*3/uL (ref 0.1–1.0)
Monocytes Relative: 13 % — ABNORMAL HIGH (ref 3–12)
Neutro Abs: 4.9 10*3/uL (ref 1.7–7.7)
Neutrophils Relative %: 60 % (ref 43–77)
Platelets: 179 10*3/uL (ref 150–400)
RBC: 4.09 MIL/uL (ref 3.87–5.11)
RDW: 13.5 % (ref 11.5–15.5)
WBC: 8.2 10*3/uL (ref 4.0–10.5)

## 2014-04-20 LAB — COMPREHENSIVE METABOLIC PANEL
ALK PHOS: 47 U/L (ref 39–117)
ALT: 39 U/L — ABNORMAL HIGH (ref 0–35)
AST: 75 U/L — AB (ref 0–37)
Albumin: 2.5 g/dL — ABNORMAL LOW (ref 3.5–5.2)
Anion gap: 6 (ref 5–15)
BILIRUBIN TOTAL: 0.5 mg/dL (ref 0.3–1.2)
CO2: 27 mmol/L (ref 19–32)
CREATININE: 0.46 mg/dL — AB (ref 0.50–1.10)
Calcium: 8.5 mg/dL (ref 8.4–10.5)
Chloride: 107 mmol/L (ref 96–112)
GFR calc Af Amer: >90 mL/min (ref >90)
GFR calc non Af Amer: >90 mL/min (ref >90)
Glucose, Bld: 107 mg/dL — ABNORMAL HIGH (ref 70–99)
Potassium: 3.8 mmol/L (ref 3.5–5.1)
Sodium: 140 mmol/L (ref 135–145)
Total Protein: 5.2 g/dL — ABNORMAL LOW (ref 6.0–8.3)

## 2014-04-20 LAB — URINALYSIS, ROUTINE W REFLEX MICROSCOPIC
Bilirubin Urine: NEGATIVE
Glucose, UA: NEGATIVE mg/dL
Ketones, ur: NEGATIVE mg/dL
LEUKOCYTES UA: NEGATIVE
Nitrite: NEGATIVE
Protein, ur: NEGATIVE mg/dL
Specific Gravity, Urine: 1.018 (ref 1.005–1.030)
Urobilinogen, UA: 0.2 mg/dL (ref 0.0–1.0)
pH: 6 (ref 5.0–8.0)

## 2014-04-20 LAB — GLUCOSE, CAPILLARY
GLUCOSE-CAPILLARY: 96 mg/dL (ref 70–99)
Glucose-Capillary: 147 mg/dL — ABNORMAL HIGH (ref 70–99)
Glucose-Capillary: 102 mg/dL — ABNORMAL HIGH (ref 70–99)
Glucose-Capillary: 121 mg/dL — ABNORMAL HIGH (ref 70–99)
Glucose-Capillary: 119 mg/dL — ABNORMAL HIGH (ref 70–99)
Glucose-Capillary: 126 mg/dL — ABNORMAL HIGH (ref 70–99)

## 2014-04-20 LAB — URINE MICROSCOPIC-ADD ON

## 2014-04-20 LAB — PROCALCITONIN: Procalcitonin: <0.1 ng/mL

## 2014-04-20 LAB — MAGNESIUM
Magnesium: 1.7 mg/dL (ref 1.5–2.5)
Magnesium: 2.3 mg/dL (ref 1.5–2.5)

## 2014-04-20 MED ORDER — QUETIAPINE FUMARATE 50 MG PO TABS
50.0000 mg | ORAL_TABLET | Freq: Two times a day (BID) | ORAL | Status: DC
  Administered 2014-04-20 – 2014-04-21 (×2): 50 mg via ORAL
  Filled 2014-04-20 (×4): qty 1

## 2014-04-20 MED ORDER — HALOPERIDOL LACTATE 5 MG/ML IJ SOLN
2.0000 mg | Freq: Four times a day (QID) | INTRAMUSCULAR | Status: DC
  Administered 2014-04-20 – 2014-04-21 (×5): 2 mg via INTRAVENOUS
  Filled 2014-04-20: qty 1
  Filled 2014-04-20: qty 0.4
  Filled 2014-04-20 (×3): qty 1
  Filled 2014-04-20 (×3): qty 0.4
  Filled 2014-04-20: qty 1

## 2014-04-20 MED ORDER — LEVOTHYROXINE SODIUM 100 MCG IV SOLR
50.0000 ug | Freq: Every day | INTRAVENOUS | Status: DC
  Administered 2014-04-20 – 2014-04-22 (×3): 50 ug via INTRAVENOUS
  Filled 2014-04-20 (×3): qty 5

## 2014-04-20 MED ORDER — NICOTINE 14 MG/24HR TD PT24
14.0000 mg | MEDICATED_PATCH | Freq: Every day | TRANSDERMAL | Status: DC
  Administered 2014-04-20 – 2014-04-24 (×5): 14 mg via TRANSDERMAL
  Filled 2014-04-20 (×5): qty 1

## 2014-04-20 MED ORDER — MAGNESIUM SULFATE 2 GM/50ML IV SOLN
2.0000 g | Freq: Once | INTRAVENOUS | Status: AC
  Administered 2014-04-20: 2 g via INTRAVENOUS
  Filled 2014-04-20: qty 50

## 2014-04-20 NOTE — Progress Notes (Signed)
  Echocardiogram 2D Echocardiogram has been performed.  Kelsey Hanna 04/20/2014, 10:12 AM

## 2014-04-20 NOTE — Evaluation (Signed)
Clinical/Bedside Swallow Evaluation Patient Details  Name: Kelsey Hanna MRN: [id number] Date of Birth: 01/05/1951  Today's Date: 04/20/2014 Time: SLP Start Time (ACUTE ONLY): 1505 SLP Stop Time (ACUTE ONLY): 1529 SLP Time Calculation (min) (ACUTE ONLY): 24 min  Past Medical History:  Past Medical History  Diagnosis Date  . Bipolar 1 disorder   . Hypothyroid   . Dyslipidemia   . Retinal detachment   . Sleep apnea   . Diabetes mellitus     dx within last yr....just takes pills  . Cancer     cancer of uterus...no chemo or radiation   Past Surgical History:  Past Surgical History  Procedure Laterality Date  . Abdominal hysterectomy    . Total abdominal hysterectomy w/ bilateral salpingoophorectomy    . Tonsillectomy    . Lumbar laminectomy/decompression microdiscectomy  07/17/2011    Procedure: LUMBAR LAMINECTOMY/DECOMPRESSION MICRODISCECTOMY;  Surgeon: Sinclair Ship, MD;  Location: [facility];  Service: Orthopedics;  Laterality: Left;  Left sided lumbar 4-5 microdisectomy   HPI:  64 year old female with h/o bipolar disorder presented to the ED via EMS after being found on floor at home, last seen normal at 2200 on 2/29. En route to ED became unresponsive. On arrival to ED was intubated for airway protection, found to be in SVT 230s. CT Head without acute abnormality, and CT Chest with LLL consolidation concerning for aspiration. Pt was extubated 3/2.   Assessment / Plan / Recommendation Clinical Impression  SLP provided Mod cues and stimulation to increase level of alertness for safe PO intake. Pt is edentulous, with missing dentition impacting her ability to masticate solids in a comfortable and timely manner. No overt signs of difficulty or aspiration are observed with Dys 2 textures or thin liquids across challenging. Will initiate Dys 2 diet and thin liquids with full supervision given pt's current mentation.    Aspiration Risk  Moderate    Diet Recommendation Dysphagia  2 (Fine chop);Thin liquid   Liquid Administration via: Cup;Straw Medication Administration: Whole meds with puree Supervision: Patient able to self feed;Full supervision/cueing for compensatory strategies Compensations: Slow rate;Small sips/bites Postural Changes and/or Swallow Maneuvers: Seated upright 90 degrees    Other  Recommendations Oral Care Recommendations: Oral care BID   Follow Up Recommendations   (tba (none anticipated for swallow))    Frequency and Duration min 2x/week  1 week   Pertinent Vitals/Pain [facility]    SLP Swallow Goals     Swallow Study Prior Functional Status       General Date of Onset: 04/18/14 HPI: 64 year old female with h/o bipolar disorder presented to the ED via EMS after being found on floor at home, last seen normal at 2200 on 2/29. En route to ED became unresponsive. On arrival to ED was intubated for airway protection, found to be in SVT 230s. CT Head without acute abnormality, and CT Chest with LLL consolidation concerning for aspiration. Pt was extubated 3/2. Type of Study: Bedside swallow evaluation Previous Swallow Assessment: none in chart Diet Prior to this Study: NPO Temperature Spikes Noted: Yes Respiratory Status: Nasal cannula History of Recent Intubation: Yes Length of Intubations (days): 1 days Date extubated: 04/19/14 Behavior/Cognition: Alert;Cooperative;Pleasant mood;Confused;Requires cueing Oral Cavity - Dentition: Edentulous Patient Positioning: Upright in bed Baseline Vocal Quality: Other (comment) (mild rough quality) Volitional Cough: Strong    Oral/Motor/Sensory Function Overall Oral Motor/Sensory Function: Appears within functional limits for tasks assessed   Ice Chips Ice chips: Within functional limits Presentation: Spoon  Thin Liquid Thin Liquid: Within functional limits Presentation: Cup;Straw    Nectar Thick Nectar Thick Liquid: Not tested   Honey Thick Honey Thick Liquid: Not tested   Puree Puree: Within  functional limits Presentation: Spoon   Solid    Solid: Impaired Oral Phase Impairments: Impaired mastication (secondary to missing dentition)      Germain Osgood, M.A. CCC-SLP [phone]  Germain Osgood 04/20/2014,3:45 PM

## 2014-04-20 NOTE — Progress Notes (Signed)
Pt at 0700 was trying to get out of bed, pulling at IV tubing and being verbally aggressive. This was witnessed by the day shift nurse as well. 2 mg of versed was given. Will continue to monitor and assess.

## 2014-04-20 NOTE — Consult Note (Signed)
[facility] Face-to-Face Psychiatry Consult   Reason for Consult:  Questionable overdose on medication. Bipolar disorder, seizure and AMS Referring Physician:  Dr. Titus Mould Patient Identification: Kelsey Hanna MRN:  [id number] Principal Diagnosis: Bipolar I disorder, most recent episode (or current) unspecified Diagnosis:   Patient Active Problem List   Diagnosis Date Noted  . Bipolar I disorder, most recent episode (or current) unspecified [F31.9] 04/20/2014  . Acute encephalopathy [G93.40] 04/18/2014  . Altered mental status [R41.82]   . Acute respiratory failure [J96.00]   . Encounter for feeding tube placement [Z87.898]   . Rapid atrial fibrillation [I48.91]   . Shortness of breath [R06.02]     Total Time spent with patient: 45 minutes  Subjective:   Kelsey Hanna is a 64 y.o. female patient admitted with Bipolar disorder, seizure and AMS.  HPI:  Kelsey Hanna is a 64 year old female seen and chart reviewed for psychiatric consultation and evaluation. Patient was admitted to [facility] intensive care unit secondary to altered mental status. Patient has been diagnosed with bipolar disorder and also history of seizure disorder. Patient was not able to participate in psychiatric evaluation today because of increased sedation secondary to her current medication management. Case was discussed with the Dr. Titus Mould and and staff RN recommended appropriate medication regimen for controlling agitation and aggressive behaviors. Staff RN reported that patient received intramuscular medication for increased agitation and aggressive behavior this morning. Patient was required intubation for airway protection in emergency department and also has SVT 230's which required correction. Patient has no family members at bedside will ask psychiatric social service to contact family members when possible. Patient urine drug screen is positive for benzodiazepines and opiates. Blood alcohol level is not  significant. Patient previous acute psychiatric hospitalization at behavioral health hospitalist 2007 and 2008.  Medical history: Patient with h/o bipolar disorder presented to the ED via EMS after being found on floor at home, last seen normal at 2200 on 2/29. En route to ED became unresponsive. On arrival to ED was intubated for airway protection, found to be in SVT 230s. Converted w/ CCB, but hypotensive after. PCCM asked to admit  Past Medical History:  Past Medical History  Diagnosis Date  . Bipolar 1 disorder   . Hypothyroid   . Dyslipidemia   . Retinal detachment   . Sleep apnea   . Diabetes mellitus     dx within last yr....just takes pills  . Cancer     cancer of uterus...no chemo or radiation    Past Surgical History  Procedure Laterality Date  . Abdominal hysterectomy    . Total abdominal hysterectomy w/ bilateral salpingoophorectomy    . Tonsillectomy    . Lumbar laminectomy/decompression microdiscectomy  07/17/2011    Procedure: LUMBAR LAMINECTOMY/DECOMPRESSION MICRODISCECTOMY;  Surgeon: Sinclair Ship, MD;  Location: [facility];  Service: Orthopedics;  Laterality: Left;  Left sided lumbar 4-5 microdisectomy   Family History: No family history on file. Social History:  History  Alcohol Use No    Comment: socially     History  Drug Use No    History   Social History  . Marital Status: Divorced    Spouse Name: N/A  . Number of Children: N/A  . Years of Education: N/A   Social History Main Topics  . Smoking status: Former Smoker -- 2.00 packs/day for 35 years    Types: Cigarettes    Quit date: 07/09/1999  . Smokeless tobacco: Not on file  . Alcohol Use:  No     Comment: socially  . Drug Use: No  . Sexual Activity: Not on file   Other Topics Concern  . None   Social History Narrative   Additional Social History:   Allergies:   Allergies  Allergen Reactions  . Lithium Other (See Comments)    "makes me feel spaced out" slurred speech     Vitals: Blood pressure 165/74, pulse 65, temperature 98.4 F (36.9 C), temperature source Oral, resp. rate 28, height 5\' 7"  (1.702 m), weight 85.6 kg (188 lb 11.4 oz), SpO2 100 %.  Risk to Self:   Risk to Others:   Prior Inpatient Therapy:   Prior Outpatient Therapy:    Current Facility-Administered Medications  Medication Dose Route Frequency Provider Last Rate Last Dose  . Ampicillin-Sulbactam (UNASYN) 3 g in sodium chloride 0.9 % 100 mL IVPB  3 g Intravenous [address] [place], RPH   3 g at 04/20/14 0737  . antiseptic oral rinse (CPC / CETYLPYRIDINIUM CHLORIDE 0.05%) solution 7 mL  7 mL Mouth Rinse QID Erick Colace, NP   7 mL at 04/20/14 0400  . bisacodyl (DULCOLAX) EC tablet 5 mg  5 mg Oral Daily PRN Erick Colace, NP       Or  . bisacodyl (DULCOLAX) suppository 10 mg  10 mg Rectal Daily PRN Erick Colace, NP      . chlorhexidine (PERIDEX) 0.12 % solution 15 mL  15 mL Mouth Rinse BID Erick Colace, NP   15 mL at 04/19/14 1941  . dexmedetomidine (PRECEDEX) 200 MCG/50ML (4 mcg/mL) infusion  0.4-1.2 mcg/kg/hr Intravenous Titrated Raylene Miyamoto, MD 15 mL/hr at 04/20/14 0915 0.7 mcg/kg/hr at 04/20/14 0915  . dextrose 5 %-0.45 % sodium chloride infusion   Intravenous Continuous Raylene Miyamoto, MD 75 mL/hr at 04/19/14 2333    . feeding supplement (PRO-STAT SUGAR FREE 64) liquid 30 mL  30 mL Per Tube BID Erick Colace, NP   30 mL at 04/19/14 0900  . feeding supplement (VITAL HIGH PROTEIN) liquid 1,000 mL  1,000 mL Per Tube Q24H Erick Colace, NP   1,000 mL at 04/19/14 0102  . fentaNYL (SUBLIMAZE) injection 50 mcg  50 mcg Intravenous Q2H PRN Erick Colace, NP   50 mcg at 04/19/14 0450  . haloperidol lactate (HALDOL) injection 2 mg  2 mg Intravenous Q4H PRN Lysle Dingwall, MD   2 mg at 04/19/14 1123  . heparin injection 5,000 Units  5,000 Units Subcutaneous 3 times per day Erick Colace, NP   5,000 Units at 04/20/14 0540  . insulin aspart (novoLOG)  injection 2-6 Units  2-6 Units Subcutaneous 6 times per day Erick Colace, NP   2 Units at 04/20/14 0048  . levothyroxine (SYNTHROID, LEVOTHROID) tablet 100 mcg  100 mcg Per Tube QAC breakfast Raylene Miyamoto, MD      . midazolam (VERSED) injection 2 mg  2 mg Intravenous Q2H PRN Lysle Dingwall, MD   2 mg at 04/20/14 0943  . pantoprazole (PROTONIX) injection 40 mg  40 mg Intravenous Daily Erick Colace, NP   40 mg at 04/20/14 0943  . sennosides (SENOKOT) 8.8 MG/5ML syrup 5 mL  5 mL Oral BID PRN Erick Colace, NP       Or  . sennosides (SENOKOT) 8.8 MG/5ML syrup 5 mL  5 mL Per Tube BID PRN Erick Colace, NP      . valproate (DEPACON) 1,000 mg  in dextrose 5 % 50 mL IVPB  1,000 mg Intravenous QHS Rande Lawman Rumbarger, RPH   1,000 mg at 04/19/14 2330  . valproate (DEPACON) 500 mg in dextrose 5 % 50 mL IVPB  500 mg Intravenous Daily Rande Lawman Rumbarger, RPH   500 mg at 04/20/14 7824    Musculoskeletal: Strength & Muscle Tone: decreased Gait & Station: unable to stand Patient leans: N/A  Psychiatric Specialty Exam: Physical Exam  ROS  Blood pressure 165/74, pulse 65, temperature 98.4 F (36.9 C), temperature source Oral, resp. rate 28, height 5\' 7"  (1.702 m), weight 85.6 kg (188 lb 11.4 oz), SpO2 100 %.Body mass index is 29.55 kg/(m^2).  General Appearance: Guarded  Eye Contact::  NA  Speech:  NA  Volume:  Decreased  Mood:  NA  Affect:  NA  Thought Process:  NA  Orientation:  NA  Thought Content:  NA  Suicidal Thoughts:  No  Homicidal Thoughts:  No  Memory:  NA  Judgement:  NA  Insight:  NA  Psychomotor Activity:  NA  Concentration:  NA  Recall:  NA  Fund of Knowledge:NA  Language: NA  Akathisia:  NA  Handed:  Right  AIMS (if indicated):     Assets:  Others:  Unable to assess at this time  ADL's:  Impaired  Cognition: Impaired,  Moderate  Sleep:      Medical Decision Making: New problem, with additional work up planned, Review of Psycho-Social Stressors  (1), Review or order clinical lab tests (1), Established Problem, Worsening (2), Review or order medicine tests (1), Review of Medication Regimen & Side Effects (2) and Review of New Medication or Change in Dosage (2)  Treatment Plan Summary: Daily contact with patient to assess and evaluate symptoms and progress in treatment and Medication management  Plan:  May use Haldol 2 mg IM every 6 hours for acute agitation and aggressive behavior with psychosis May use Seroquel 50 mg twice daily if she is cooperative to to swallow the medication  Patient does not meet criteria for psychiatric inpatient admission. Supportive therapy provided about ongoing stressors. Appreciate psychiatric consultation and follow up as clinically required Please contact [phone] or [phone] if needs further assistance  Disposition: We will reevaluate when patient is able to participate in psychiatric evaluation.  Tineka Uriegas,JANARDHAHA R. 04/20/2014 10:22 AM

## 2014-04-20 NOTE — H&P (Signed)
PULMONARY / CRITICAL CARE MEDICINE   Name: Kelsey Hanna MRN: [id number] DOB: 1950/05/27    ADMISSION DATE:  04/18/2014 CONSULTATION DATE:  04/18/2014  REFERRING MD :  Ellender Hose   CHIEF COMPLAINT:    INITIAL PRESENTATION: 64 year old female with h/o bipolar disorder presented to the ED via EMS after being found on floor at home, last seen normal at 2200 on 2/29. En route to ED became unresponsive. On arrival to ED was intubated for airway protection, found to be in  SVT 230s. Converted w/ CCB, but hypotensive after. PCCM asked to admit.   STUDIES:  CT head 3/1 - chronic atrophic and ischemic changes without acute abnormality.  CT cervical spine 3/1 - multilevel degenerative changes without acute abnormality CTA chest 3/1 - no pulmonary embolus. Left lower lobe airspace consolidation concerning for aspiration. Small pleural effusions bilaterally. Mild right base atelectasis.  Left ankle x-ray 3/1 - chronic changes in talus. Generalized soft tissue swelling.    SIGNIFICANT EVENTS: Intubated 04/18/2014   3/2- improved neuro status 3/2- extubated  SUBJECTIVE: cam pos , but more awake  VITAL SIGNS: Temp:  [97.7 F (36.5 C)-99.4 F (37.4 C)] 98.4 F (36.9 C) (03/03 0833) Pulse Rate:  [43-107] 65 (03/03 0930) Resp:  [17-33] 28 (03/03 0930) BP: (84-165)/(40-111) 165/74 mmHg (03/03 0930) SpO2:  [84 %-100 %] 100 % (03/03 0930) Weight:  [85.6 kg (188 lb 11.4 oz)] 85.6 kg (188 lb 11.4 oz) (03/03 0435) HEMODYNAMICS:   VENTILATOR SETTINGS:   INTAKE / OUTPUT:  Intake/Output Summary (Last 24 hours) at 04/20/14 1209 Last data filed at 04/20/14 0915  Gross per 24 hour  Intake 2303.86 ml  Output   1200 ml  Net 1103.86 ml    PHYSICAL EXAMINATION: General:  Chronically ill-appearing female, awake, agitation better Neuro:  Cam pos, follows commands HEENT:  jvd wnl Cardiovascular:  S1S2, no MGR. Distal pulses intact.  Lungs:  CTA Abdomen:  Soft, bowel sounds present.  Musculoskeletal:  left ankle splinted.  Skin:  BUE mottled. BLE with chronic edema.   LABS:  CBC  Recent Labs Lab 04/18/14 1148 04/18/14 1151 04/19/14 0945 04/20/14 0449  WBC 12.8*  --  8.8 8.2  HGB 15.0 17.7* 11.3* 12.3  HCT 47.4* 52.0* 34.9* 38.6  PLT 283  --  193 179   Coag's  Recent Labs Lab 04/18/14 1148  INR 1.02   BMET  Recent Labs Lab 04/18/14 1148 04/18/14 1151 04/19/14 0945 04/20/14 0449  NA 139 140 140 140  K 4.3 4.3 3.8 3.8  CL 105 103 113* 107  CO2 24  --  23 27  BUN 13 16 7  <5*  CREATININE 0.93 0.90 0.61 0.46*  GLUCOSE 151* 155* 110* 107*   Electrolytes  Recent Labs Lab 04/18/14 1148  04/19/14 0300 04/19/14 0945 04/19/14 1753 04/20/14 0449  CALCIUM 9.7  --   --  8.5  --  8.5  MG 2.0  < > 1.8  --  2.0 1.7  PHOS  --   < > 2.1*  --  4.9* 4.4  < > = values in this interval not displayed. Sepsis Markers  Recent Labs Lab 04/18/14 1450 04/18/14 1711 04/19/14 0945 04/20/14 0449  LATICACIDVEN 6.28* 3.24*  --   --   PROCALCITON  --   --  <0.10 <0.10   ABG  Recent Labs Lab 04/18/14 1229 04/19/14 0921  PHART 7.253* 7.312*  PCO2ART 57.9* 46.0*  PO2ART 332.0* 112.0*   Liver Enzymes  Recent Labs Lab  04/18/14 1148 04/20/14 0449  AST 26 75*  ALT 17 39*  ALKPHOS 62 47  BILITOT 0.4 0.5  ALBUMIN 3.6 2.5*   Cardiac Enzymes  Recent Labs Lab 04/18/14 1148  TROPONINI <0.03   Glucose  Recent Labs Lab 04/19/14 1149 04/19/14 1607 04/19/14 1939 04/20/14 0011 04/20/14 0430 04/20/14 0836  GLUCAP 88 117* 123* 147* 102* 121*    Imaging Portable Chest Xray  04/19/2014   CLINICAL DATA:  Respiratory failure.  EXAM: PORTABLE CHEST - 1 VIEW  COMPARISON:  CT 04/18/2014.  Chest x-ray 04/18/2014 .  FINDINGS: Endotracheal tube and NG tube in good anatomic position. Mediastinum and hilar structures are normal. Left lower lobe atelectasis and/or infiltrate. Small left pleural effusion cannot be excluded. Stable mild cardiomegaly. Pulmonary vascularity  normal. No acute osseous abnormality .  IMPRESSION: 1. Lines and tubes in good anatomic position. 2. Left lower lobe atelectasis and/or infiltrate again noted. Small left pleural effusion cannot be excluded.   Electronically Signed   By: Marcello Moores  Register   On: 04/19/2014 07:14     ASSESSMENT / PLAN:  PULMONARY OETT 04/18/2014  A: Acute  hypercarbic respiratory failure: etiology unclear. D-dx: pharmacological vs also consider post-ictal. LLL airspace disease: presume aspiration PNA. Doubt this was cause of decompensation.  P:  IS when able  CARDIOVASCULAR A:  SVT - resolved SIRS vs sepsis w/ elevated lactic acidosis   P:  Tele maintain Echo assessment -pending  RENAL A:  Hypomagnesemia P:   Chem in am  Replace mag kvo  GASTROINTESTINAL A:  R/o dysphagia P:   Stress ulcer prophylaxis - famotidine  Will need slp  HEMATOLOGIC A:   Polycythemia, dvt prevention P:  DVT prophylaxis - [place] heparin   INFECTIOUS A:   SIRS/sepsis  Aspiration PNA (unimpressed) Recent LLE cellulitis  P:  Trend procalcitonin to limit abx BCx2 3/1 >> Sputum 3/1>>> Abx:  unasyn 3/1>>> vanc 3/1>>>3/2  Add stop date unasyn 5 days  ENDOCRINE A:  hyperglycemia   H/o hypothyroidism P:   Trend CBG SSI  Evaluate TSH - .03 reduce synthroid   NEUROLOGIC A:   Acute Encephalopathy of unknown cause. ? Seizure, ? Drug overdose ?  P:   RASS goal: -1 EEG negative Continued to support Valproic wnl, maintain precedex on going, goal to dc by adding haldol 2 mg scheduled, may need to increase dose Add Seroquel  pscyh appreciated   Muscular skeletal  A: Chronic Left ankle fracture w/ malformation  P: Supportive    Social work active  FAMILY  - Updates:   - Inter-disciplinary family meet or Palliative Care meeting due by: 3/8  Ccm time 30 min   Lavon Paganini. Titus Mould, MD, [place] Pgr: [facility]

## 2014-04-20 NOTE — Clinical Social Work Note (Signed)
CSW consult acknowledged:  Clinical Social Worker has received a consult for SNF placement. CSW awaiting PT/OT evaluation once patient is appropriate to participate. CSW will continue to follow for PT/OT recommendations.   Glendon Axe, MSW, LCSWA [phone] 04/20/2014 2:34 PM

## 2014-04-20 NOTE — Progress Notes (Signed)
Pt original foley had been removed on 04/19/11 at 1500. Pt did not void within 6 hours. Pt had approximately 371 mls of urine in bladder resulted upon bladder scanning. Notified MD Sommers and ordered in and out cath. Within the following 6 hours, the pt did not void again. Pt had approximately 400-600 mls of urine in bladder at 0200. Notified MD Nelda Marseille and ordered to place a foley cath. Will continue to assess and monitor.

## 2014-04-21 LAB — GLUCOSE, CAPILLARY
GLUCOSE-CAPILLARY: 139 mg/dL — AB (ref 70–99)
GLUCOSE-CAPILLARY: 66 mg/dL — AB (ref 70–99)
GLUCOSE-CAPILLARY: 112 mg/dL — AB (ref 70–99)
Glucose-Capillary: 111 mg/dL — ABNORMAL HIGH (ref 70–99)
Glucose-Capillary: 58 mg/dL — ABNORMAL LOW (ref 70–99)
Glucose-Capillary: 75 mg/dL (ref 70–99)
Glucose-Capillary: 76 mg/dL (ref 70–99)
Glucose-Capillary: 77 mg/dL (ref 70–99)
Glucose-Capillary: 84 mg/dL (ref 70–99)

## 2014-04-21 LAB — BASIC METABOLIC PANEL
ANION GAP: 9 (ref 5–15)
CALCIUM: 8.7 mg/dL (ref 8.4–10.5)
CO2: 30 mmol/L (ref 19–32)
CREATININE: 0.48 mg/dL — AB (ref 0.50–1.10)
Chloride: 102 mmol/L (ref 96–112)
Glucose, Bld: 122 mg/dL — ABNORMAL HIGH (ref 70–99)
Potassium: 3.7 mmol/L (ref 3.5–5.1)
Sodium: 141 mmol/L (ref 135–145)

## 2014-04-21 LAB — PROCALCITONIN

## 2014-04-21 LAB — MAGNESIUM: Magnesium: 1.8 mg/dL (ref 1.5–2.5)

## 2014-04-21 LAB — PHOSPHORUS: Phosphorus: 4.2 mg/dL (ref 2.3–4.6)

## 2014-04-21 MED ORDER — MUPIROCIN 2 % EX OINT
1.0000 | TOPICAL_OINTMENT | Freq: Two times a day (BID) | CUTANEOUS | Status: DC
Start: 2014-04-21 — End: 2014-04-24
  Administered 2014-04-21 – 2014-04-24 (×7): 1 via NASAL
  Filled 2014-04-21 (×4): qty 22

## 2014-04-21 MED ORDER — MAGNESIUM SULFATE 2 GM/50ML IV SOLN
2.0000 g | Freq: Once | INTRAVENOUS | Status: AC
  Administered 2014-04-21: 2 g via INTRAVENOUS
  Filled 2014-04-21: qty 50

## 2014-04-21 MED ORDER — CHLORHEXIDINE GLUCONATE CLOTH 2 % EX PADS
6.0000 | MEDICATED_PAD | Freq: Every day | CUTANEOUS | Status: DC
Start: 2014-04-21 — End: 2014-04-24
  Administered 2014-04-21 – 2014-04-24 (×3): 6 via TOPICAL

## 2014-04-21 MED ORDER — POTASSIUM CHLORIDE CRYS ER 20 MEQ PO TBCR
40.0000 meq | EXTENDED_RELEASE_TABLET | Freq: Once | ORAL | Status: AC
  Administered 2014-04-21: 40 meq via ORAL
  Filled 2014-04-21: qty 2

## 2014-04-21 MED ORDER — LISINOPRIL 5 MG PO TABS
5.0000 mg | ORAL_TABLET | Freq: Every day | ORAL | Status: DC
  Administered 2014-04-21 – 2014-04-24 (×4): 5 mg via ORAL
  Filled 2014-04-21 (×4): qty 1

## 2014-04-21 MED ORDER — QUETIAPINE FUMARATE 100 MG PO TABS
100.0000 mg | ORAL_TABLET | Freq: Two times a day (BID) | ORAL | Status: DC
  Administered 2014-04-21 – 2014-04-24 (×6): 100 mg via ORAL
  Filled 2014-04-21 (×6): qty 1

## 2014-04-21 NOTE — Consult Note (Signed)
Psychiatry Consult follow-up note  Reason for Consult:  Questionable overdose on medication. Bipolar disorder, seizure and AMS Referring Physician:  Dr. Titus Mould Patient Identification: Kelsey Hanna MRN:  [id number] Principal Diagnosis: Bipolar I disorder, most recent episode (or current) unspecified Diagnosis:   Patient Active Problem List   Diagnosis Date Noted  . Bipolar I disorder, most recent episode (or current) unspecified [F31.9] 04/20/2014  . Acute encephalopathy [G93.40] 04/18/2014  . Altered mental status [R41.82]   . Acute respiratory failure [J96.00]   . Encounter for feeding tube placement [Z87.898]   . Rapid atrial fibrillation [I48.91]   . Shortness of breath [R06.02]     Total Time spent with patient: 30 minutes  Subjective:   Kelsey Hanna is a 64 y.o. female patient admitted with Bipolar disorder, seizure and AMS.  HPI:  Kelsey Hanna is a 64 year old female seen and chart reviewed for psychiatric consultation and evaluation. Patient was admitted to [facility] intensive care unit secondary to altered mental status. Patient has been diagnosed with bipolar disorder and also history of seizure disorder. Patient was not able to participate in psychiatric evaluation today because of increased sedation secondary to her current medication management. Case was discussed with the Dr. Titus Mould and and staff RN recommended appropriate medication regimen for controlling agitation and aggressive behaviors. Staff RN reported that patient received intramuscular medication for increased agitation and aggressive behavior this morning. Patient was required intubation for airway protection in emergency department and also has SVT 230's which required correction. Patient has no family members at bedside will ask psychiatric social service to contact family members when possible. Patient urine drug screen is positive for benzodiazepines and opiates. Blood alcohol level is not  significant. Patient previous acute psychiatric hospitalization at behavioral health hospitalist 2007 and 2008.  Interval history: Patient daughter was at bedside stated that she was receiving outpatient psychiatric services from the [facility] with [facility] services, which were transferred to the [facility]. Patient lives alone in an independent apartment and CNA will check on her medication monitoring from time to time. Patient daughter is also supportive and check on her from time to time. Patient is suicidal medication and a bubble pack. Reportedly she was receiving a lot of pain medication which may have confused her and might have taken overdose unintentionally. Patient does not endorse suicidal, homicidal ideation, intentions or plans. Patient is a poor historian and does not report any specific symptoms but agrees that she needed to stay in hospital for care. Patient daughter is also stated patient has some kind of personality issues. Will review patient home medications and restart when she is more stable patient daughter does not want to change her psychiatric medication because she is doing well at home before coming to the hospital. Case discussed with the psychiatric social service who will follow up with them regarding disposition plans.  Medical history: Patient with h/o bipolar disorder presented to the ED via EMS after being found on floor at home, last seen normal at 2200 on 2/29. En route to ED became unresponsive. On arrival to ED was intubated for airway protection, found to be in SVT 230s. Converted w/ CCB, but hypotensive after. PCCM asked to admit  Past Medical History:  Past Medical History  Diagnosis Date  . Bipolar 1 disorder   . Hypothyroid   . Dyslipidemia   . Retinal detachment   . Sleep apnea   . Diabetes mellitus     dx within  last yr....just takes pills  . Cancer     cancer of uterus...no chemo or radiation    Past Surgical History  Procedure  Laterality Date  . Abdominal hysterectomy    . Total abdominal hysterectomy w/ bilateral salpingoophorectomy    . Tonsillectomy    . Lumbar laminectomy/decompression microdiscectomy  07/17/2011    Procedure: LUMBAR LAMINECTOMY/DECOMPRESSION MICRODISCECTOMY;  Surgeon: Sinclair Ship, MD;  Location: [place];  Service: Orthopedics;  Laterality: Left;  Left sided lumbar 4-5 microdisectomy   Family History: No family history on file. Social History:  History  Alcohol Use No    Comment: socially     History  Drug Use No    History   Social History  . Marital Status: Divorced    Spouse Name: N/A  . Number of Children: N/A  . Years of Education: N/A   Social History Main Topics  . Smoking status: Former Smoker -- 2.00 packs/day for 35 years    Types: Cigarettes    Quit date: 07/09/1999  . Smokeless tobacco: Not on file  . Alcohol Use: No     Comment: socially  . Drug Use: No  . Sexual Activity: Not on file   Other Topics Concern  . None   Social History Narrative   Additional Social History:   Allergies:   Allergies  Allergen Reactions  . Lithium Other (See Comments)    "makes me feel spaced out" slurred speech    Vitals: Blood pressure 109/57, pulse 31, temperature 97.7 F (36.5 C), temperature source Oral, resp. rate 26, height 5\' 7"  (1.702 m), weight 88.6 kg (195 lb 5.2 oz), SpO2 99 %.  Risk to Self:   Risk to Others:   Prior Inpatient Therapy:   Prior Outpatient Therapy:    Current Facility-Administered Medications  Medication Dose Route Frequency Provider Last Rate Last Dose  . Ampicillin-Sulbactam (UNASYN) 3 g in sodium chloride 0.9 % 100 mL IVPB  3 g Intravenous Q8H Raylene Miyamoto, MD   3 g at 04/21/14 0749  . antiseptic oral rinse (CPC / CETYLPYRIDINIUM CHLORIDE 0.05%) solution 7 mL  7 mL Mouth Rinse QID Erick Colace, NP   7 mL at 04/21/14 0400  . bisacodyl (DULCOLAX) EC tablet 5 mg  5 mg Oral Daily PRN Erick Colace, NP       Or  .  bisacodyl (DULCOLAX) suppository 10 mg  10 mg Rectal Daily PRN Erick Colace, NP      . chlorhexidine (PERIDEX) 0.12 % solution 15 mL  15 mL Mouth Rinse BID Erick Colace, NP   15 mL at 04/21/14 0752  . Chlorhexidine Gluconate Cloth 2 % PADS 6 each  6 each Topical Q0600 Raylene Miyamoto, MD   6 each at 04/21/14 [phone]  . dexmedetomidine (PRECEDEX) 200 MCG/50ML (4 mcg/mL) infusion  0.4-1.2 mcg/kg/hr Intravenous Titrated Raylene Miyamoto, MD   Stopped at 04/21/14 0745  . dextrose 5 %-0.45 % sodium chloride infusion   Intravenous Continuous Raylene Miyamoto, MD 75 mL/hr at 04/21/14 0259    . fentaNYL (SUBLIMAZE) injection 50 mcg  50 mcg Intravenous Q2H PRN Erick Colace, NP   50 mcg at 04/21/14 0109  . haloperidol lactate (HALDOL) injection 2 mg  2 mg Intravenous Q6H Corky Sox, MD   2 mg at 04/21/14 3235  . heparin injection 5,000 Units  5,000 Units Subcutaneous 3 times per day Erick Colace, NP   5,000 Units at 04/21/14 [phone]  .  insulin aspart (novoLOG) injection 2-6 Units  2-6 Units Subcutaneous 6 times per day Erick Colace, NP   2 Units at 04/21/14 [phone]  . levothyroxine (SYNTHROID, LEVOTHROID) injection 50 mcg  50 mcg Intravenous Daily Rande Lawman Rumbarger, RPH   50 mcg at 04/21/14 9604  . midazolam (VERSED) injection 2 mg  2 mg Intravenous Q2H PRN Lysle Dingwall, MD   2 mg at 04/20/14 1928  . mupirocin ointment (BACTROBAN) 2 % 1 application  1 application Nasal BID Raylene Miyamoto, MD   1 application at 54/09/81 [phone]  . nicotine (NICODERM CQ - dosed in mg/24 hours) patch 14 mg  14 mg Transdermal Daily Corky Sox, MD   14 mg at 04/21/14 0930  . pantoprazole (PROTONIX) injection 40 mg  40 mg Intravenous Daily Erick Colace, NP   40 mg at 04/21/14 0930  . QUEtiapine (SEROQUEL) tablet 50 mg  50 mg Oral BID Corky Sox, MD   50 mg at 04/21/14 0930  . sennosides (SENOKOT) 8.8 MG/5ML syrup 5 mL  5 mL Oral BID PRN Erick Colace, NP       Or  . sennosides (SENOKOT) 8.8  MG/5ML syrup 5 mL  5 mL Per Tube BID PRN Erick Colace, NP      . valproate (DEPACON) 1,000 mg in dextrose 5 % 50 mL IVPB  1,000 mg Intravenous QHS Rande Lawman Rumbarger, RPH   1,000 mg at 04/20/14 2159  . valproate (DEPACON) 500 mg in dextrose 5 % 50 mL IVPB  500 mg Intravenous Daily Rande Lawman Rumbarger, RPH   500 mg at 04/21/14 0932    Musculoskeletal: Strength & Muscle Tone: decreased Gait & Station: unable to stand Patient leans: N/A  Psychiatric Specialty Exam: Physical Exam as per history and physical :  ROS: Mood swings, agitation and both physical and verbal aggression   Blood pressure 109/57, pulse 31, temperature 97.7 F (36.5 C), temperature source Oral, resp. rate 26, height 5\' 7"  (1.702 m), weight 88.6 kg (195 lb 5.2 oz), SpO2 99 %.Body mass index is 30.59 kg/(m^2).  General Appearance: Guarded  Eye Contact::  Good  Speech:  Clear and Coherent and Slow  Volume:  Decreased  Mood:  Anxious and Depressed  Affect:  Appropriate and Congruent  Thought Process:  Coherent and Goal Directed  Orientation:  Full (Time, Place, and Person)  Thought Content:  WDL  Suicidal Thoughts:  No  Homicidal Thoughts:  No  Memory:  Immediate;   Fair Recent;   Poor  Judgement:  Impaired  Insight:  Lacking  Psychomotor Activity:  Decreased  Concentration:  Fair  Recall:  [employer] of Knowledge:Fair  Language: Good  Akathisia:  Negative  Handed:  Right  AIMS (if indicated):     Assets:  Communication Skills Desire for Improvement Financial Resources/Insurance Housing Intimacy Leisure Time Resilience Social Support  ADL's:  Impaired  Cognition: Impaired,  Mild  Sleep:      Medical Decision Making: New problem, with additional work up planned, Review of Psycho-Social Stressors (1), Review or order clinical lab tests (1), Established Problem, Worsening (2), Review or order medicine tests (1), Review of Medication Regimen & Side Effects (2) and Review of New Medication or Change  in Dosage (2)  Treatment Plan Summary: Daily contact with patient to assess and evaluate symptoms and progress in treatment and Medication management  Plan:  Continue Depacon 500 mg daily morning and thousand milligrams at bedtime which can be  switched to oral medication and she is able to swallow Continue Haldol 2 mg IM every 6 hours for acute agitation and aggressive behavior with psychosis Continue Seroquel 50 mg twice daily if she is cooperative to to swallow the medication  Patient does not meet criteria for psychiatric inpatient admission. Supportive therapy provided about ongoing stressors. Appreciate psychiatric consultation and follow up as clinically required Please contact [phone] or [phone] if needs further assistance  Disposition: We will reevaluate when patient is able to participate in psychiatric evaluation.  Kelsey Hanna,JANARDHAHA R. 04/21/2014 11:59 AM

## 2014-04-21 NOTE — Progress Notes (Signed)
Speech Language Pathology Treatment: Dysphagia  Patient Details Name: Kelsey Hanna MRN: [id number] DOB: [birth year] Today's Date: 04/21/2014 Time: 1683-8706 SLP Time Calculation (min) (ACUTE ONLY): 12 min  Assessment / Plan / Recommendation Clinical Impression  Pt very alert today, and required Min cues for redirection to consume PO trials of Dys 2 textures and thin liquids with no overt s/s of aspiration observed. Sitter and RN both report no overt difficulties with meals and/or medications today. She is at her baseline diet level per patient report given lack of dentition. No further acute SLP needs identified, will sign off at this time.    HPI HPI: 64 year old female with h/o bipolar disorder presented to the ED via EMS after being found on floor at home, last seen normal at 2200 on 2/29. En route to ED became unresponsive. On arrival to ED was intubated for airway protection, found to be in SVT 230s. CT Head without acute abnormality, and CT Chest with LLL consolidation concerning for aspiration. Pt was extubated 3/2.   Pertinent Vitals Pain Assessment: No/denies pain  SLP Plan  All goals met    Recommendations Diet recommendations: Dysphagia 2 (fine chop);Thin liquid Liquids provided via: Cup;Straw Medication Administration: Whole meds with puree Supervision: Patient able to self feed;Full supervision/cueing for compensatory strategies Compensations: Slow rate;Small sips/bites Postural Changes and/or Swallow Maneuvers: Seated upright 90 degrees       Oral Care Recommendations: Oral care BID Follow up Recommendations: None Plan: All goals met       Germain Osgood, M.A. CCC-SLP [phone]  Germain Osgood 04/21/2014, 2:54 PM

## 2014-04-21 NOTE — Progress Notes (Signed)
[place] Physician Progress Note and Electrolyte Replacement  Patient Name: Kelsey Hanna DOB: [birth year] MRN: [id number]  Date of Service  04/21/2014   HPI/Events of Note    Recent Labs Lab 04/18/14 1148 04/18/14 1151  04/19/14 0300 04/19/14 0945 04/19/14 1753 04/20/14 0449 04/20/14 1655 04/21/14 0243  NA 139 140  --   --  140  --  140  --  141  K 4.3 4.3  --   --  3.8  --  3.8  --  3.7  CL 105 103  --   --  113*  --  107  --  102  CO2 24  --   --   --  23  --  27  --  30  GLUCOSE 151* 155*  --   --  110*  --  107*  --  122*  BUN 13 16  --   --  7  --  <5*  --  <5*  CREATININE 0.93 0.90  --   --  0.61  --  0.46*  --  0.48*  CALCIUM 9.7  --   --   --  8.5  --  8.5  --  8.7  MG 2.0  --   < > 1.8  --  2.0 1.7 2.3 1.8  PHOS  --   --   < > 2.1*  --  4.9* 4.4 4.4 4.2  < > = values in this interval not displayed.  Estimated Creatinine Clearance: 80.1 mL/min (by C-G formula based on Cr of 0.48).  Intake/Output      03/03 0701 - 03/04 0700   I.V. (mL/kg) 1158.6 (13.1)   IV Piggyback 160   Total Intake(mL/kg) 1318.6 (14.9)   Urine (mL/kg/hr) 1830 (0.9)   Total Output 1830   Net -511.4        - I/O DETAILED x 24h    Total I/O In: 1126.2 [I.V.:966.2; IV Piggyback:160] Out: 1550 [Urine:1550] - I/O THIS SHIFT    ASSESSMENT Low mag Low K  eICURN Interventions  kcl 40 Mag 2gm   ASSESSMENT: MAJOR ELECTROLYTE      Dr. Brand Males, M.D., [facility]C.P Pulmonary and Critical Care Medicine Staff Physician [place] [facility] Pager: [phone], If no answer or between  15:00h - 7:00h: call [phone]  04/21/2014 5:33 AM

## 2014-04-21 NOTE — Evaluation (Signed)
Physical Therapy Evaluation Patient Details Name: Kelsey Hanna MRN: [id number] DOB: 1950/10/13 Today's Date: 04/21/2014   History of Present Illness  64 year old female with h/o bipolar disorder presented to the ED via EMS after being found on floor at home, last seen normal at 2200 on 2/29. En route to ED became unresponsive. On arrival to ED was intubated for airway protection, found to be in SVT 230s. Converted w/ CCB, but hypotensive after.  Clinical Impression  Pt admitted with the above complications. Pt currently with functional limitations due to the deficits listed below (see PT Problem List). Currently requires min assist for mobility, ambulating up to 65 feet with a rolling walker, SpO2 down to 89-91% on room air HR 120s while ambulating. Needs cues for safety and currently lives alone. Pt will benefit from skilled PT to increase their independence and safety with mobility to allow discharge to the venue listed below.       Follow Up Recommendations SNF    Equipment Recommendations  None recommended by PT    Recommendations for Other Services       Precautions / Restrictions Precautions Precautions: Fall Restrictions Weight Bearing Restrictions: No      Mobility  Bed Mobility Overal bed mobility: Needs Assistance Bed Mobility: Supine to Sit     Supine to sit: Supervision;HOB elevated     General bed mobility comments: Supervision for safety. Cues for technique. Relies heavily on rail, requires exra time  Transfers Overall transfer level: Needs assistance Equipment used: Rolling walker (2 wheeled) Transfers: Sit to/from Stand Sit to Stand: Min assist         General transfer comment: Min assist for stability to rise from lowest bed setting. Cues for hand placement. Performed x2. Leans posteriorly upon standing.  Ambulation/Gait Ambulation/Gait assistance: Min assist Ambulation Distance (Feet): 65 Feet Assistive device: Rolling walker (2  wheeled) Gait Pattern/deviations: Step-through pattern;Decreased stride length;Shuffle;Trunk flexed;Drifts right/left;Narrow base of support   Gait velocity interpretation: Below normal speed for age/gender General Gait Details: Very slow and guarded. Cues to increase stride length. Pt shuffling steps. Min assist for walker control as she drifts into objects in hall. No buckling noted. Pt reported dizziness during bout. HR to 122, SpO2 down to 89-91% on room air.  Stairs            Wheelchair Mobility    Modified Rankin (Stroke Patients Only)       Balance Overall balance assessment: Needs assistance Sitting-balance support: No upper extremity supported;Feet supported Sitting balance-Leahy Scale: Fair     Standing balance support: Bilateral upper extremity supported Standing balance-Leahy Scale: Poor                               Pertinent Vitals/Pain Pain Assessment: No/denies pain    Home Living Family/patient expects to be discharged to:: Private residence Living Arrangements: Alone Available Help at Discharge:  (States she lives alone, does not have help available) Type of Home: House Home Access: Stairs to enter Entrance Stairs-Rails: Left Entrance Stairs-Number of Steps: 4 Home Layout: Two level;Able to live on main level with bedroom/bathroom Home Equipment: Walker - 4 wheels;Walker - standard;Cane - quad;Bedside commode;Shower seat;Wheelchair - manual      Prior Function Level of Independence: Independent               Hand Dominance   Dominant Hand: Left    Extremity/Trunk Assessment   Upper Extremity Assessment:  Defer to OT evaluation           Lower Extremity Assessment: Generalized weakness         Communication   Communication: No difficulties  Cognition Arousal/Alertness: Awake/alert Behavior During Therapy: WFL for tasks assessed/performed Overall Cognitive Status: Impaired/Different from baseline Area of  Impairment: Orientation Orientation Level: Disoriented to;Time;Place             General Comments: Unsure of location, thinks it is june. Re-oriented patient.    General Comments General comments (skin integrity, edema, etc.): start of therapy BP 146/66 HR 90s. End of therapy HR 96, SpO2 91% on room air, BP 147/75. - Ambulating HR into 120s, SpO2 89% on room air.    Exercises General Exercises - Lower Extremity Long Arc Quad: AROM;Strengthening;Both;10 reps;Seated      Assessment/Plan    PT Assessment Patient needs continued PT services  PT Diagnosis Difficulty walking;Abnormality of gait;Generalized weakness   PT Problem List Decreased strength;Decreased activity tolerance;Decreased balance;Decreased mobility;Decreased cognition;Decreased knowledge of use of DME;Decreased safety awareness;Cardiopulmonary status limiting activity  PT Treatment Interventions DME instruction;Gait training;Functional mobility training;Therapeutic activities;Therapeutic exercise;Balance training;Neuromuscular re-education;Cognitive remediation;Patient/family education   PT Goals (Current goals can be found in the Care Plan section) Acute Rehab PT Goals Patient Stated Goal: none stated PT Goal Formulation: With patient Potential to Achieve Goals: Good    Frequency Min 3X/week   Barriers to discharge Decreased caregiver support lives alone    Co-evaluation               End of Session Equipment Utilized During Treatment: Gait belt Activity Tolerance: Patient tolerated treatment well Patient left: in chair;with call bell/phone within reach;with nursing/sitter in room Nurse Communication: Mobility status         Time: 6195-0932 PT Time Calculation (min) (ACUTE ONLY): 25 min   Charges:   PT Evaluation $Initial PT Evaluation Tier I: 1 Procedure PT Treatments $Gait Training: 8-22 mins   PT G CodesEllouise Newer 04/21/2014, 4:56 PM Camille Bal [place],  [place]

## 2014-04-21 NOTE — Progress Notes (Signed)
Report given to Darla RN 6N. PT transported via wheelchair to 6N. RN at bedside to receive pt at handoff.

## 2014-04-21 NOTE — Clinical Social Work Psych Note (Signed)
Psychiatry has been consulted regarding questionable overdose attempt.  Patient is alert, though only oriented to self at present time.   Psych CSW attempted to contact patient's daughter, Adrian Prows at [phone].  Psych CSW left message and is awaiting a return call.  Nonnie Done, [place] [phone]  Psychiatric & Orthopedics (5N 1-16) Clinical Social Worker

## 2014-04-21 NOTE — Progress Notes (Signed)
ANTIBIOTIC CONSULT NOTE - Follow-up  Pharmacy Consult for Unasyn Indication: pneumonia  Allergies  Allergen Reactions  . Lithium Other (See Comments)    "makes me feel spaced out" slurred speech    Patient Measurements: Height: 5\' 7"  (170.2 cm) Weight: 195 lb 5.2 oz (88.6 kg) IBW/kg (Calculated) : 61.6   Vital Signs: Temp: 97.7 F (36.5 C) (03/04 0754) Temp Source: Oral (03/04 0754) BP: 148/71 mmHg (03/04 0800) Pulse Rate: 69 (03/04 0800) Intake/Output from previous day: 03/03 0701 - 03/04 0700 In: 1462.9 [I.V.:1252.9; IV Piggyback:210] Out: 2130 [Urine:2130] Intake/Output from this shift:    Labs:  Recent Labs  04/18/14 1148 04/18/14 1151 04/19/14 0945 04/20/14 0449 04/21/14 0243  WBC 12.8*  --  8.8 8.2  --   HGB 15.0 17.7* 11.3* 12.3  --   PLT 283  --  193 179  --   CREATININE 0.93 0.90 0.61 0.46* 0.48*   Estimated Creatinine Clearance: 80.1 mL/min (by C-G formula based on Cr of 0.48). No results for input(s): VANCOTROUGH, VANCOPEAK, VANCORANDOM, GENTTROUGH, GENTPEAK, GENTRANDOM, TOBRATROUGH, TOBRAPEAK, TOBRARND, AMIKACINPEAK, AMIKACINTROU, AMIKACIN in the last 72 hours.   Microbiology: Recent Results (from the past 720 hour(s))  Urine culture     Status: None   Collection Time: 04/01/14  8:49 PM  Result Value Ref Range Status   Specimen Description URINE, CLEAN CATCH  Final   Special Requests NONE  Final   Colony Count NO GROWTH Performed at [facility]   Final   Culture NO GROWTH Performed at [facility]   Final   Report Status 04/03/2014 FINAL  Final  Culture, blood (routine x 2)     Status: None (Preliminary result)   Collection Time: 04/18/14  3:42 PM  Result Value Ref Range Status   Specimen Description BLOOD LEFT FOREARM  Final   Special Requests BOTTLES DRAWN AEROBIC AND ANAEROBIC 3CC  Final   Culture   Final           BLOOD CULTURE RECEIVED NO GROWTH TO DATE CULTURE WILL BE HELD FOR 5 DAYS BEFORE ISSUING A FINAL NEGATIVE  REPORT Performed at [facility]    Report Status PENDING  Incomplete  Culture, blood (routine x 2)     Status: None (Preliminary result)   Collection Time: 04/18/14  3:59 PM  Result Value Ref Range Status   Specimen Description BLOOD HAND LEFT  Final   Special Requests BOTTLES DRAWN AEROBIC ONLY 2CC  Final   Culture   Final           BLOOD CULTURE RECEIVED NO GROWTH TO DATE CULTURE WILL BE HELD FOR 5 DAYS BEFORE ISSUING A FINAL NEGATIVE REPORT Performed at [facility]    Report Status PENDING  Incomplete  MRSA PCR Screening     Status: Abnormal   Collection Time: 04/18/14  6:38 PM  Result Value Ref Range Status   MRSA by PCR POSITIVE (A) NEGATIVE Final    Comment:        The GeneXpert MRSA Assay (FDA approved for NASAL specimens only), is one component of a comprehensive MRSA colonization surveillance program. It is not intended to diagnose MRSA infection nor to guide or monitor treatment for MRSA infections. RESULT CALLED TO, READ BACK BY AND VERIFIED WITH: Bethann Humble RN [id number] 04/18/14 A BROWNING     Assessment: AMS 64 y/o F found down in her home with AMS. Initiated on unasyn for possible aspiration pneumonia. Pt is currently afebrile and WBC is  WNL. SCr is also stable and WNL. Dose remains appropriate. Unasyn scheduled to stop tomorrow after 5 days of therapy.   Unasyn 3/1>> (3/5)  3/1 Blood - NGTD  Goal of Therapy:  Eradication of infection  Plan:  1. Continue unasyn 3gm IV Q8H through tomorrow 2. F/u renal fxn, C&S, clinical status  Salome Arnt, PharmD, BCPS Pager # [phone] 04/21/2014 8:14 AM

## 2014-04-21 NOTE — Progress Notes (Signed)
PULMONARY / CRITICAL CARE MEDICINE   Name: Kelsey Hanna MRN: [id number] DOB: Apr 07, 1950    ADMISSION DATE:  04/18/2014 CONSULTATION DATE:  04/18/2014  REFERRING MD :  Ellender Hose   CHIEF COMPLAINT:    INITIAL PRESENTATION: 64 year old female with h/o bipolar disorder presented to the ED via EMS after being found on floor at home, last seen normal at 2200 on 2/29. En route to ED became unresponsive. On arrival to ED was intubated for airway protection, found to be in  SVT 230s. Converted w/ CCB, but hypotensive after. PCCM asked to admit.   STUDIES:  CT head 3/1 - chronic atrophic and ischemic changes without acute abnormality.  CT cervical spine 3/1 - multilevel degenerative changes without acute abnormality CTA chest 3/1 - no pulmonary embolus. Left lower lobe airspace consolidation concerning for aspiration. Small pleural effusions bilaterally. Mild right base atelectasis.  Left ankle x-ray 3/1 - chronic changes in talus. Generalized soft tissue swelling.   SIGNIFICANT EVENTS: Intubated 04/18/2014   3/2- improved neuro status 3/2- extubated 3/3- haldol, Seroquel added, off precedex  SUBJECTIVE: cam neg  VITAL SIGNS: Temp:  [97.4 F (36.3 C)-98.5 F (36.9 C)] 97.9 F (36.6 C) (03/04 1208) Pulse Rate:  [31-89] 89 (03/04 1400) Resp:  [17-33] 20 (03/04 1400) BP: (103-166)/(54-122) 107/54 mmHg (03/04 1400) SpO2:  [92 %-100 %] 100 % (03/04 1400) Weight:  [88.6 kg (195 lb 5.2 oz)] 88.6 kg (195 lb 5.2 oz) (03/04 0438) HEMODYNAMICS:   VENTILATOR SETTINGS:   INTAKE / OUTPUT:  Intake/Output Summary (Last 24 hours) at 04/21/14 1450 Last data filed at 04/21/14 1100  Gross per 24 hour  Intake 2291.73 ml  Output   2400 ml  Net -108.27 ml    PHYSICAL EXAMINATION: General:  Chronically ill-appearing female, awake, agitation resolved Neuro:  Cam pneg, follows commands HEENT:  jvd wnl Cardiovascular:  S1S2, no MGR. Distal pulses intact.  Lungs:  CTA Abdomen:  Soft, bowel sounds  present.  Musculoskeletal: left ankle splinted.  Skin:  BUE mottled. BLE with chronic edema.   LABS:  CBC  Recent Labs Lab 04/18/14 1148 04/18/14 1151 04/19/14 0945 04/20/14 0449  WBC 12.8*  --  8.8 8.2  HGB 15.0 17.7* 11.3* 12.3  HCT 47.4* 52.0* 34.9* 38.6  PLT 283  --  193 179   Coag's  Recent Labs Lab 04/18/14 1148  INR 1.02   BMET  Recent Labs Lab 04/19/14 0945 04/20/14 0449 04/21/14 0243  NA 140 140 141  K 3.8 3.8 3.7  CL 113* 107 102  CO2 23 27 30   BUN 7 <5* <5*  CREATININE 0.61 0.46* 0.48*  GLUCOSE 110* 107* 122*   Electrolytes  Recent Labs Lab 04/19/14 0945  04/20/14 0449 04/20/14 1655 04/21/14 0243  CALCIUM 8.5  --  8.5  --  8.7  MG  --   < > 1.7 2.3 1.8  PHOS  --   < > 4.4 4.4 4.2  < > = values in this interval not displayed. Sepsis Markers  Recent Labs Lab 04/18/14 1450 04/18/14 1711 04/19/14 0945 04/20/14 0449 04/21/14 0243  LATICACIDVEN 6.28* 3.24*  --   --   --   PROCALCITON  --   --  <0.10 <0.10 <0.10   ABG  Recent Labs Lab 04/18/14 1229 04/19/14 0921  PHART 7.253* 7.312*  PCO2ART 57.9* 46.0*  PO2ART 332.0* 112.0*   Liver Enzymes  Recent Labs Lab 04/18/14 1148 04/20/14 0449  AST 26 75*  ALT 17 39*  ALKPHOS 62 47  BILITOT 0.4 0.5  ALBUMIN 3.6 2.5*   Cardiac Enzymes  Recent Labs Lab 04/18/14 1148  TROPONINI <0.03   Glucose  Recent Labs Lab 04/20/14 1607 04/20/14 1929 04/21/14 0012 04/21/14 0423 04/21/14 0720 04/21/14 1128  GLUCAP 96 126* 111* 139* 77 84    Imaging No results found.   ASSESSMENT / PLAN:  PULMONARY OETT 04/18/2014  A: Acute  hypercarbic respiratory failure: etiology unclear. D-dx: pharmacological vs also consider post-ictal. LLL airspace disease: presume aspiration PNA. Doubt this was cause of decompensation.  P:  IS when able ambulate PT keep negalance  CARDIOVASCULAR A:  SVT - resolved SIRS vs sepsis w/ elevated lactic acidosis  SysCHF Pa hTN P:  Tele dc Echo  45%, MR mod, PA 48 Allow neg balance on own Will need cardiology follow up Start low dose ACEI BB in future  RENAL A:  Hypomagnesemia, HypoK P:   Chem in am  Replace mag, k with EF noted kvo  GASTROINTESTINAL A:  R/o dysphagia P:   Stress ulcer prophylaxis - famotidine  diet  HEMATOLOGIC A:   Polycythemia, dvt prevention autodiuresis - hemoconcetration P:  DVT prophylaxis - [place] heparin keep until walking  INFECTIOUS A:   SIRS/sepsis  Aspiration PNA (unimpressed) Recent LLE cellulitis  P:  Trend procalcitonin to limit abx BCx2 3/1 >> Sputum 3/1>>> Abx:  unasyn 3/1>>>stop date 3/5 vanc 3/1>>>3/2  ENDOCRINE A:  hyperglycemia   H/o hypothyroidism P:   Trend CBG SSI  Evaluate TSH - .03 reduce synthroid   NEUROLOGIC A:   Acute Encephalopathy of unknown cause. ? Seizure, ? Drug overdose ?  P:   RASS goal:0 Continued to support Valproic wnl, maintain Did well with haldol dc and increase Seroquel. maintain  pscyh following  Muscular skeletal  A: Chronic Left ankle fracture w/ malformation  P: Supportive    Social work active  FAMILY  - Updates:   - Inter-disciplinary family meet or Palliative Care meeting due by: 3/8  To [facility]ad, med floor, sitter  Lavon Paganini. Titus Mould, MD, [place] Pgr: [facility]

## 2014-04-22 ENCOUNTER — Inpatient Hospital Stay (HOSPITAL_COMMUNITY): Payer: Medicare Other

## 2014-04-22 DIAGNOSIS — I10 Essential (primary) hypertension: Secondary | ICD-10-CM

## 2014-04-22 DIAGNOSIS — R413 Other amnesia: Secondary | ICD-10-CM

## 2014-04-22 DIAGNOSIS — R5381 Other malaise: Secondary | ICD-10-CM

## 2014-04-22 DIAGNOSIS — R05 Cough: Secondary | ICD-10-CM

## 2014-04-22 LAB — GLUCOSE, CAPILLARY
GLUCOSE-CAPILLARY: 56 mg/dL — AB (ref 70–99)
GLUCOSE-CAPILLARY: 57 mg/dL — AB (ref 70–99)
GLUCOSE-CAPILLARY: 80 mg/dL (ref 70–99)
Glucose-Capillary: 63 mg/dL — ABNORMAL LOW (ref 70–99)
Glucose-Capillary: 75 mg/dL (ref 70–99)
Glucose-Capillary: 71 mg/dL (ref 70–99)
Glucose-Capillary: 83 mg/dL (ref 70–99)
Glucose-Capillary: 96 mg/dL (ref 70–99)

## 2014-04-22 MED ORDER — INSULIN ASPART 100 UNIT/ML ~~LOC~~ SOLN: 2.0000 [IU] | Freq: Three times a day (TID) | SUBCUTANEOUS | Status: DC

## 2014-04-22 MED ORDER — DIVALPROEX SODIUM 500 MG PO DR TAB: 500.0000 mg | DELAYED_RELEASE_TABLET | Freq: Two times a day (BID) | ORAL | Status: DC

## 2014-04-22 MED ORDER — DIVALPROEX SODIUM 500 MG PO DR TAB
500.0000 mg | DELAYED_RELEASE_TABLET | Freq: Every morning | ORAL | Status: DC
  Administered 2014-04-23 – 2014-04-24 (×2): 500 mg via ORAL
  Filled 2014-04-22 (×2): qty 1

## 2014-04-22 MED ORDER — ASPIRIN EC 325 MG PO TBEC
325.0000 mg | DELAYED_RELEASE_TABLET | Freq: Every day | ORAL | Status: DC
  Administered 2014-04-22 – 2014-04-24 (×3): 325 mg via ORAL
  Filled 2014-04-22 (×3): qty 1

## 2014-04-22 MED ORDER — METOPROLOL TARTRATE 12.5 MG HALF TABLET
12.5000 mg | ORAL_TABLET | Freq: Two times a day (BID) | ORAL | Status: DC
  Administered 2014-04-22: 12.5 mg via ORAL
  Filled 2014-04-22: qty 1

## 2014-04-22 MED ORDER — IPRATROPIUM-ALBUTEROL 0.5-2.5 (3) MG/3ML IN SOLN: 3.0000 mL | Freq: Four times a day (QID) | RESPIRATORY_TRACT | Status: DC

## 2014-04-22 MED ORDER — GUAIFENESIN ER 600 MG PO TB12
600.0000 mg | ORAL_TABLET | Freq: Two times a day (BID) | ORAL | Status: DC
  Administered 2014-04-22 – 2014-04-24 (×5): 600 mg via ORAL
  Filled 2014-04-22 (×5): qty 1

## 2014-04-22 MED ORDER — IPRATROPIUM-ALBUTEROL 0.5-2.5 (3) MG/3ML IN SOLN
3.0000 mL | Freq: Three times a day (TID) | RESPIRATORY_TRACT | Status: DC
  Filled 2014-04-22 (×2): qty 3

## 2014-04-22 MED ORDER — DIVALPROEX SODIUM 500 MG PO DR TAB
1000.0000 mg | DELAYED_RELEASE_TABLET | Freq: Every day | ORAL | Status: DC
  Administered 2014-04-22 – 2014-04-23 (×2): 1000 mg via ORAL
  Filled 2014-04-22 (×3): qty 2

## 2014-04-22 MED ORDER — METOPROLOL TARTRATE 25 MG PO TABS
25.0000 mg | ORAL_TABLET | Freq: Two times a day (BID) | ORAL | Status: DC
  Administered 2014-04-23 (×2): 25 mg via ORAL
  Filled 2014-04-22 (×3): qty 1

## 2014-04-22 NOTE — Progress Notes (Signed)
Hypoglycemic Event  CBG: 63  Treatment: 15 GM carbohydrate snack  Symptoms: None  Follow-up CBG: Time:0346 CBG Result:75  Possible Reasons for Event: Inadequate meal intake  Comments/MD notified:M.Meriel Flavors, Leroy Libman C  Remember to initiate Hypoglycemia Order Set & complete

## 2014-04-22 NOTE — Progress Notes (Signed)
PROGRESS NOTE  Kelsey Hanna TSV:[id number] DOB: 1950-10-14 DOA: 04/18/2014 PCP: Pcp Not In System  HPI/Recap of past 24 hours: Patient is poor historian, confabulate, only oriented to person, but very pleasant, refer the female sitting in room is her "boy friend" reported feel "fabulous". Denies pain.  Noted intermittent nonproductive cough during encounter.  Assessment/Plan: Principal Problem:   Bipolar I disorder, most recent episode (or current) unspecified Active Problems:   Acute encephalopathy   Altered mental status   Acute respiratory failure   Encounter for feeding tube placement   Rapid atrial fibrillation   Shortness of breath  Brief summary of hospital stay prior to transfer to hospitalist service: patient was found on floor for unclear duration on 3/1, intubated by EMS, send to ER found to be in rapid afib vs SVT, converted to sinus rhythm after cardizem. however has persistent hypotension, patient was admitted to ICU. Patient was extubated on 3/2, remain stayed in ICU due to difficulty to control agitation. Psychiatry consulted in the ICU recommended add seroquel/haldol. Patient agitation seems improved and patient is transferred to hospitalist service.  Unresponsiveness:  Resolved. likely secondary to drug overdose. Patient was intubated on 3/1 and extubated on 3/2. EEG no seizure. Diet modified to dysphagia diet.   Cough: nonproductive. aspiration pna? Chronic smoker. Lung exam diminished left base. CTA on admission with LLL infiltrate and mild bilateral pleural effusion, patient received vancx1day? Per icu note, unasyn x 4days from admission. Will repeat cxr pa/lateral on 3/5, add mucinex/duoneb for now. No fever, no leukocytosis. May consider lasix if pleural effusion. On exam, no edema. Recent echo adequate ef.  Brief Afib vs SVT? CHADsVASC score 4. Will consult cardiology for recommendation of anticoagulation. Start lopressor for now. Low tsh, will check free t4,  hold synthyroid for now. Keep K>4, mag>2. Start high dose asa for now.  NIDDM2, a1c pending. Hold metformin. On ssi here.  HTN: now on lopressor and lisinopril.   Hypothyroidism: tsh low, free t 4 pending, hold synthroid supplement for now.  Psych: h/o bipolar, currently confabulate , oriented only to person, but no agitation this am, denies SI/HI. Sitter in room. Change to oral depakote at home dose, now that patient able to eat. Continue Seroquel. Monitor QTc.  Deconditioning: continue PT/OT/speech. SNF placement.  Smoker: on nicotine patch.  MRSA colonization: contact precuation, bactroban/chlorhexidine    Code Status: full  Family Communication: patient  Disposition Plan: SNF   Consultants:  Cardiology  Psychiatry  PCCM  Procedures:  Intubated 3/1, extubation 3/1  Antibiotics:  vanc on admissionx1  unasyn from 3/1 to 3/5   Objective: BP 151/47 mmHg  Pulse 88  Temp(Src) 97.8 F (36.6 C) (Oral)  Resp 18  Ht 5\' 7"  (1.702 m)  Wt 83.008 kg (183 lb)  BMI 28.66 kg/m2  SpO2 99%  Intake/Output Summary (Last 24 hours) at 04/22/14 1203 Last data filed at 04/22/14 0300  Gross per 24 hour  Intake   1135 ml  Output   2800 ml  Net  -1665 ml   Filed Weights   04/20/14 0435 04/21/14 0438 04/22/14 0514  Weight: 85.6 kg (188 lb 11.4 oz) 88.6 kg (195 lb 5.2 oz) 83.008 kg (183 lb)    Exam:  General: Chronically ill-appearing female, awake, agitation resolved Neuro: oriented to person only, follows commands, no focal deficit HEENT: jvd wnl Cardiovascular: S1S2, no MGR. Distal pulses intact.  Lungs: decreased breath sound, more at left base. Abdomen: Soft, bowel sounds present.  Musculoskeletal: left ankle  splinted.  Skin: BUE mottled. BLE with chronic edema? nonpitting    Data Reviewed: Basic Metabolic Panel:  Recent Labs Lab 04/18/14 1148 04/18/14 1151  04/19/14 0300 04/19/14 0945 04/19/14 1753 04/20/14 0449 04/20/14 1655  04/21/14 0243  NA 139 140  --   --  140  --  140  --  141  K 4.3 4.3  --   --  3.8  --  3.8  --  3.7  CL 105 103  --   --  113*  --  107  --  102  CO2 24  --   --   --  23  --  27  --  30  GLUCOSE 151* 155*  --   --  110*  --  107*  --  122*  BUN 13 16  --   --  7  --  <5*  --  <5*  CREATININE 0.93 0.90  --   --  0.61  --  0.46*  --  0.48*  CALCIUM 9.7  --   --   --  8.5  --  8.5  --  8.7  MG 2.0  --   < > 1.8  --  2.0 1.7 2.3 1.8  PHOS  --   --   < > 2.1*  --  4.9* 4.4 4.4 4.2  < > = values in this interval not displayed. Liver Function Tests:  Recent Labs Lab 04/18/14 1148 04/20/14 0449  AST 26 75*  ALT 17 39*  ALKPHOS 62 47  BILITOT 0.4 0.5  PROT 7.0 5.2*  ALBUMIN 3.6 2.5*   No results for input(s): LIPASE, AMYLASE in the last 168 hours. No results for input(s): AMMONIA in the last 168 hours. CBC:  Recent Labs Lab 04/18/14 1148 04/18/14 1151 04/19/14 0945 04/20/14 0449  WBC 12.8*  --  8.8 8.2  NEUTROABS 9.1*  --   --  4.9  HGB 15.0 17.7* 11.3* 12.3  HCT 47.4* 52.0* 34.9* 38.6  MCV 96.9  --  94.3 94.4  PLT 283  --  193 179   Cardiac Enzymes:    Recent Labs Lab 04/18/14 1148 04/18/14 2016 04/19/14 0300 04/19/14 0945  CKTOTAL 611* 6283* 3741* 3912*  TROPONINI <0.03  --   --   --    BNP (last 3 results)  Recent Labs  04/01/14 2018 04/18/14 1148  BNP 37.7 146.4*    ProBNP (last 3 results) No results for input(s): PROBNP in the last 8760 hours.  CBG:  Recent Labs Lab 04/22/14 0745 04/22/14 0817 04/22/14 0854 04/22/14 0931 04/22/14 1146  GLUCAP 56* 57* 71 80 83    Recent Results (from the past 240 hour(s))  Culture, blood (routine x 2)     Status: None (Preliminary result)   Collection Time: 04/18/14  3:42 PM  Result Value Ref Range Status   Specimen Description BLOOD LEFT FOREARM  Final   Special Requests BOTTLES DRAWN AEROBIC AND ANAEROBIC 3CC  Final   Culture   Final           BLOOD CULTURE RECEIVED NO GROWTH TO DATE CULTURE WILL BE  HELD FOR 5 DAYS BEFORE ISSUING A FINAL NEGATIVE REPORT Performed at [facility]    Report Status PENDING  Incomplete  Culture, blood (routine x 2)     Status: None (Preliminary result)   Collection Time: 04/18/14  3:59 PM  Result Value Ref Range Status   Specimen Description BLOOD HAND LEFT  Final  Special Requests BOTTLES DRAWN AEROBIC ONLY 2CC  Final   Culture   Final           BLOOD CULTURE RECEIVED NO GROWTH TO DATE CULTURE WILL BE HELD FOR 5 DAYS BEFORE ISSUING A FINAL NEGATIVE REPORT Performed at [facility]    Report Status PENDING  Incomplete  MRSA PCR Screening     Status: Abnormal   Collection Time: 04/18/14  6:38 PM  Result Value Ref Range Status   MRSA by PCR POSITIVE (A) NEGATIVE Final    Comment:        The GeneXpert MRSA Assay (FDA approved for NASAL specimens only), is one component of a comprehensive MRSA colonization surveillance program. It is not intended to diagnose MRSA infection nor to guide or monitor treatment for MRSA infections. RESULT CALLED TO, READ BACK BY AND VERIFIED WITH: Bethann Humble RN [id number] 04/18/14 A BROWNING      Studies: No results found.  Scheduled Meds: . ampicillin-sulbactam (UNASYN) IV  3 g Intravenous Q8H  . Chlorhexidine Gluconate Cloth  6 each Topical Q0600  . guaiFENesin  600 mg Oral BID  . insulin aspart  2-6 Units Subcutaneous TID WC  . ipratropium-albuterol  3 mL Nebulization Q6H  . lisinopril  5 mg Oral Daily  . metoprolol tartrate  12.5 mg Oral BID WC  . mupirocin ointment  1 application Nasal BID  . nicotine  14 mg Transdermal Daily  . QUEtiapine  100 mg Oral BID    Continuous Infusions:  stopped on 3/5, patient now passed swallow eval, able to eat.     Otilio Groleau  [facility] Pager [phone]. If 7PM-7AM, please contact night-coverage at [email or web address], password [facility] 04/22/2014, 12:03 PM  LOS: 4 days

## 2014-04-22 NOTE — Clinical Social Work Psychosocial (Signed)
Clinical Social Work Department BRIEF PSYCHOSOCIAL ASSESSMENT 04/22/2014  Patient:  Kelsey Hanna, Kelsey Hanna     Account Number:  [id number]     Admit date:  04/18/2014  Clinical Social Worker:  Hubert Azure  Date/Time:  04/22/2014 06:11 PM  Referred by:  Physician  Date Referred:  04/22/2014 Referred for  SNF Placement   Other Referral:   Interview type:  Family Other interview type:   CSW spoke with patient's daughter Adrian Prows by phone.    PSYCHOSOCIAL DATA Living Status:  OTHER Admitted from facility:   Level of care:   Primary support name:  Adrian Prows ([phone]) Primary support relationship to patient:  CHILD, ADULT Degree of support available:   Good    CURRENT CONCERNS Current Concerns  Post-Acute Placement  Other - See comment   Other Concerns:   Assistance with resources or services assisting with medication management.    SOCIAL WORK ASSESSMENT / PLAN CSW contacted Cl[place] by phone. CSW introduced self and explained role. CSW explained SNF placement process. Per Sharyn Lull, she does not desire patient to d/c to a SNF. Sharyn Lull states patient resides in an apartment and she does not want to risk her losing her spot if she has to go to a SNF. 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 Sharyn Lull states she really needs support with making sure patient "takes all of her medication on time, at the right time." Sharyn Lull states medication compliance is not the issue with patient, it is making sure she takes it at the right time and does not take more or less than she should. CSW educated daughter on private duty RN who could provide the supervision patient needs. Sharyn Lull states she cannot afford a Tax inspector and has conducted researched on a medication box that is locked and comes with compartments that only unlock at scheduled times. Sharyn Lull states her fear is patient  will not take all of the medication in the compartments by accident. Sharyn Lull states she really "wished there was something out there to help." CSW acknowledged daughter's frustration and educated her that [facility] agencies can only provide a limited amount of CNA and RN hours a week, in order for her to guarantee supervision, she would need additional assistance. Sharyn Lull states she believes patient is able to manage and would not want to limit her independence. CSW attempted to discuss benefits of SNF with Sharyn Lull, but Sharyn Lull declined SNF discussion.   Assessment/plan status:  No Further Intervention Required Other assessment/ plan:   RNCM to be made aware of daughter's request for PT services with [facility]. No further needs. CSW signing off.   Information/referral to community resources:    PATIENT'S/FAMILY'S RESPONSE TO PLAN OF CARE: Sharyn Lull has declined SNF placement and wants to ensure patient can receive PT once she is discharged home with [facility].    Rossie, [place] [facility] Clinical Social Worker [phone]

## 2014-04-22 NOTE — Plan of Care (Signed)
Problem: Phase I Progression Outcomes Goal: OOB as tolerated unless otherwise ordered Outcome: Completed/Met Date Met:  04/22/14 Up in room for several hours today.

## 2014-04-22 NOTE — Evaluation (Signed)
Occupational Therapy Evaluation and Discharge Patient Details Name: Kelsey Hanna MRN: [id number] DOB: Dec 30, 1950 Today's Date: 04/22/2014    History of Present Illness 64 year old female with h/o bipolar disorder presented to the ED via EMS after being found on floor at home, last seen normal at 2200 on 2/29. En route to ED became unresponsive. On arrival to ED was intubated for airway protection, found to be in SVT 230s. Converted w/ CCB, but hypotensive after.   Clinical Impression   This 64 yo female admitted with above presents to acute OT at a min guard A level when up on her feet and a set/S level when she is seated. She will benefit from a short SNF stay. Acute OT will sign off.    Follow Up Recommendations  SNF    Equipment Recommendations   (TBD next venue)       Precautions / Restrictions Precautions Precautions: Fall Restrictions Weight Bearing Restrictions: No      Mobility Bed Mobility Overal bed mobility: Needs Assistance Bed Mobility: Supine to Sit     Supine to sit: Supervision        Transfers Overall transfer level: Needs assistance Equipment used: None Transfers: Sit to/from Stand Sit to Stand: Min guard                   ADL Overall ADL's : Needs assistance/impaired Eating/Feeding: Independent;Sitting   Grooming: Supervision/safety;Set up;Sitting   Upper Body Bathing: Set up;Supervision/ safety;Sitting   Lower Body Bathing: Min guard;Sit to/from stand   Upper Body Dressing : Set up;Supervision/safety;Sitting   Lower Body Dressing: Min guard;Sit to/from stand   Toilet Transfer: Min guard;Ambulation;Comfort height toilet;Grab bars   Toileting- Clothing Manipulation and Hygiene: Min guard;Sit to/from stand         General ADL Comments: overall at set up/S with min guard A when she is up on her feet for BADLs     Vision Additional Comments: Left eye cataract--blind          Pertinent Vitals/Pain Pain Assessment:  No/denies pain     Hand Dominance Left   Extremity/Trunk Assessment Upper Extremity Assessment Upper Extremity Assessment: Overall WFL for tasks assessed           Communication Communication Communication: No difficulties   Cognition Arousal/Alertness: Awake/alert Behavior During Therapy: WFL for tasks assessed/performed Overall Cognitive Status: No family/caregiver present to determine baseline cognitive functioning                                Home Living Family/patient expects to be discharged to:: Skilled nursing facility Living Arrangements: Alone Available Help at Discharge:  (none; although says some family lives near by) Type of Home: House Home Access: Stairs to enter [employer] of Steps: 4 Entrance Stairs-Rails: Left Home Layout: Two level;Able to live on main level with bedroom/bathroom               Home Equipment: Walker - 4 wheels;Walker - standard;Cane - quad;Bedside commode;Shower seat;Wheelchair - manual          Prior Functioning/Environment Level of Independence: Independent             OT Diagnosis: Generalized weakness;Cognitive deficits   OT Problem List: Decreased cognition;Impaired balance (sitting and/or standing)      OT Goals(Current goals can be found in the care plan section) Acute Rehab OT Goals Patient Stated Goal: agreeable to short term rehab then  home  OT Frequency:                End of Session    Activity Tolerance: Patient tolerated treatment well Patient left: in chair;with call bell/phone within reach;with nursing/sitter in room   Time: 1121-1143 OT Time Calculation (min): 22 min Charges:  OT General Charges $OT Visit: 1 Procedure OT Evaluation $Initial OT Evaluation Tier I: 1 Procedure  Almon Register [phone] 04/22/2014, 11:48 AM

## 2014-04-22 NOTE — Consult Note (Signed)
Primary cardiologist: Dr Eden Emms  HPI: 64 yo female with PMH of bipolar disorder, diabetes mellitus, uterine cancer for evaluation of atrial fibrillation. Patient apparently presented on March 1 after being found down on the floor. She was in atrial fibrillation and converted with calcium channel blocker. She was intubated for airway protection. Patient treated for SIRS. Patient at present denies dyspnea, chest pain, palpitations, syncope. She is somewhat confused.  Medications Prior to Admission  Medication Sig Dispense Refill  . cyclobenzaprine (FLEXERIL) 10 MG tablet Take 10 mg by mouth 3 (three) times daily.    . divalproex (DEPAKOTE) 500 MG DR tablet Take 500-1,000 mg by mouth 2 (two) times daily. Take 1 tablet in the morning and 2 tablets in the evening    . DULoxetine (CYMBALTA) 30 MG capsule Take 30 mg by mouth daily.    Marland Kitchen gabapentin (NEURONTIN) 300 MG capsule Take 300 mg by mouth 3 (three) times daily.    Marland Kitchen levothyroxine (SYNTHROID, LEVOTHROID) 175 MCG tablet Take 175 mcg by mouth daily before breakfast.    . metFORMIN (GLUCOPHAGE) 500 MG tablet Take 500 mg by mouth 2 (two) times daily with a meal.    . morphine (MSIR) 15 MG tablet Take 15 mg by mouth every 6 (six) hours as needed for severe pain.    Marland Kitchen morphine (MSIR) 30 MG tablet Take 30 mg by mouth every 6 (six) hours as needed for severe pain.    Marland Kitchen PRESCRIPTION MEDICATION Take 1 tablet by mouth at bedtime as needed (for sleep).    . tapentadol (NUCYNTA) 50 MG TABS tablet Take 50 mg by mouth every 6 (six) hours as needed.    . VOLTAREN 1 % GEL Apply 1 application topically daily.    Marland Kitchen atorvastatin (LIPITOR) 40 MG tablet Take 40 mg by mouth every evening.     . clindamycin (CLEOCIN) 150 MG capsule Take 3 capsules (450 mg total) by mouth 4 (four) times daily. 60 capsule 0  . colchicine 0.6 MG tablet Take 0.6 mg by mouth daily.    . furosemide (LASIX) 80 MG tablet Take 80 mg by mouth daily.    . hydrochlorothiazide (HYDRODIURIL) 25  MG tablet Take 25 mg by mouth every morning.    Marland Kitchen HYDROcodone-acetaminophen (NORCO/VICODIN) 5-325 MG per tablet Take 1 tablet by mouth every 6 (six) hours as needed for moderate pain or severe pain. 10 tablet 0    Allergies  Allergen Reactions  . Lithium Other (See Comments)    "makes me feel spaced out" slurred speech    Past Medical History  Diagnosis Date  . Bipolar 1 disorder   . Hypothyroid   . Dyslipidemia   . Retinal detachment   . Sleep apnea   . Diabetes mellitus     dx within last yr....just takes pills  . Cancer     cancer of uterus...no chemo or radiation    Past Surgical History  Procedure Laterality Date  . Abdominal hysterectomy    . Total abdominal hysterectomy w/ bilateral salpingoophorectomy    . Tonsillectomy    . Lumbar laminectomy/decompression microdiscectomy  07/17/2011    Procedure: LUMBAR LAMINECTOMY/DECOMPRESSION MICRODISCECTOMY;  Surgeon: Emilee Hero, MD;  Location: [facility] OR;  Service: Orthopedics;  Laterality: Left;  Left sided lumbar 4-5 microdisectomy    History   Social History  . Marital Status: Divorced    Spouse Name: N/A  . Number of Children: N/A  . Years of Education: N/A   Occupational History  .  Not on file.   Social History Main Topics  . Smoking status: Former Smoker -- 2.00 packs/day for 35 years    Types: Cigarettes    Quit date: 07/09/1999  . Smokeless tobacco: Not on file  . Alcohol Use: No     Comment: socially  . Drug Use: No  . Sexual Activity: Not on file   Other Topics Concern  . Not on file   Social History Narrative    No family history on file.  ROS:  no fevers or chills, productive cough, hemoptysis, dysphasia, odynophagia, melena, hematochezia, dysuria, hematuria, rash, seizure activity, orthopnea, PND, pedal edema, claudication. Remaining systems are negative.  Physical Exam:   Blood pressure 140/65, pulse 96, temperature 98.7 F (37.1 C), temperature source Oral, resp. rate 19, height 5'  7" (1.702 m), weight 183 lb (83.008 kg), SpO2 100 %.  General:  Well developed/obese in NAD Skin warm/dry Patient not depressed No peripheral clubbing Back-normal HEENT-normal/normal eyelids Neck supple/normal carotid upstroke bilaterally; no bruits; no JVD; no thyromegaly chest - CTA/ normal expansion CV - RRR/normal S1 and S2; no murmurs, rubs or gallops;  PMI nondisplaced Abdomen -NT/ND, no HSM, no mass, + bowel sounds, no bruit 2+ femoral pulses, no bruits Ext-no edema, chords, 2+ DP Neuro-grossly nonfocal  ECG on arrival atrial fibrillation with a rapid ventricular response. Follow-up electrocardiogram showed sinus rhythm with no ST changes.  Results for orders placed or performed during the hospital encounter of 04/18/14 (from the past 48 hour(s))  Glucose, capillary     Status: Abnormal   Collection Time: 04/20/14  7:29 PM  Result Value Ref Range   Glucose-Capillary 126 (H) 70 - 99 mg/dL  Glucose, capillary     Status: Abnormal   Collection Time: 04/21/14 12:12 AM  Result Value Ref Range   Glucose-Capillary 111 (H) 70 - 99 mg/dL   Comment 1 Notify RN    Comment 2 Documented in Char   Magnesium     Status: None   Collection Time: 04/21/14  2:43 AM  Result Value Ref Range   Magnesium 1.8 1.5 - 2.5 mg/dL  Phosphorus     Status: None   Collection Time: 04/21/14  2:43 AM  Result Value Ref Range   Phosphorus 4.2 2.3 - 4.6 mg/dL  Procalcitonin     Status: None   Collection Time: 04/21/14  2:43 AM  Result Value Ref Range   Procalcitonin <0.10 ng/mL    Comment:        Interpretation: PCT (Procalcitonin) <= 0.5 ng/mL: Systemic infection (sepsis) is not likely. Local bacterial infection is possible. (NOTE)         ICU PCT Algorithm               Non ICU PCT Algorithm    ----------------------------     ------------------------------         PCT < 0.25 ng/mL                 PCT < 0.1 ng/mL     Stopping of antibiotics            Stopping of antibiotics       strongly  encouraged.               strongly encouraged.    ----------------------------     ------------------------------       PCT level decrease by               PCT < 0.25 ng/mL       >=  80% from peak PCT       OR PCT 0.25 - 0.5 ng/mL          Stopping of antibiotics                                             encouraged.     Stopping of antibiotics           encouraged.    ----------------------------     ------------------------------       PCT level decrease by              PCT >= 0.25 ng/mL       < 80% from peak PCT        AND PCT >= 0.5 ng/mL            Continuin g antibiotics                                              encouraged.       Continuing antibiotics            encouraged.    ----------------------------     ------------------------------     PCT level increase compared          PCT > 0.5 ng/mL         with peak PCT AND          PCT >= 0.5 ng/mL             Escalation of antibiotics                                          strongly encouraged.      Escalation of antibiotics        strongly encouraged.   Basic metabolic panel     Status: Abnormal   Collection Time: 04/21/14  2:43 AM  Result Value Ref Range   Sodium 141 135 - 145 mmol/L   Potassium 3.7 3.5 - 5.1 mmol/L   Chloride 102 96 - 112 mmol/L   CO2 30 19 - 32 mmol/L   Glucose, Bld 122 (H) 70 - 99 mg/dL   BUN <5 (L) 6 - 23 mg/dL   Creatinine, Ser 9.70 (L) 0.50 - 1.10 mg/dL   Calcium 8.7 8.4 - 45.3 mg/dL   GFR calc non Af Amer >90 >90 mL/min   GFR calc Af Amer >90 >90 mL/min    Comment: (NOTE) The eGFR has been calculated using the CKD EPI equation. This calculation has not been validated in all clinical situations. eGFR's persistently <90 mL/min signify possible Chronic Kidney Disease.    Anion gap 9 5 - 15  Glucose, capillary     Status: Abnormal   Collection Time: 04/21/14  4:23 AM  Result Value Ref Range   Glucose-Capillary 139 (H) 70 - 99 mg/dL  Glucose, capillary     Status: None   Collection Time:  04/21/14  7:20 AM  Result Value Ref Range   Glucose-Capillary 77 70 - 99 mg/dL  Glucose, capillary     Status: None   Collection Time: 04/21/14 11:28 AM  Result Value Ref Range  Glucose-Capillary 84 70 - 99 mg/dL  Glucose, capillary     Status: Abnormal   Collection Time: 04/21/14  3:52 PM  Result Value Ref Range   Glucose-Capillary 66 (L) 70 - 99 mg/dL  Glucose, capillary     Status: Abnormal   Collection Time: 04/21/14  4:09 PM  Result Value Ref Range   Glucose-Capillary 58 (L) 70 - 99 mg/dL  Glucose, capillary     Status: None   Collection Time: 04/21/14  4:54 PM  Result Value Ref Range   Glucose-Capillary 75 70 - 99 mg/dL  Glucose, capillary     Status: None   Collection Time: 04/21/14  8:12 PM  Result Value Ref Range   Glucose-Capillary 76 70 - 99 mg/dL  Glucose, capillary     Status: Abnormal   Collection Time: 04/21/14 11:20 PM  Result Value Ref Range   Glucose-Capillary 112 (H) 70 - 99 mg/dL  Glucose, capillary     Status: Abnormal   Collection Time: 04/22/14  3:19 AM  Result Value Ref Range   Glucose-Capillary 63 (L) 70 - 99 mg/dL  Glucose, capillary     Status: None   Collection Time: 04/22/14  3:46 AM  Result Value Ref Range   Glucose-Capillary 75 70 - 99 mg/dL  Glucose, capillary     Status: Abnormal   Collection Time: 04/22/14  7:45 AM  Result Value Ref Range   Glucose-Capillary 56 (L) 70 - 99 mg/dL  Glucose, capillary     Status: Abnormal   Collection Time: 04/22/14  8:17 AM  Result Value Ref Range   Glucose-Capillary 57 (L) 70 - 99 mg/dL  Glucose, capillary     Status: None   Collection Time: 04/22/14  8:54 AM  Result Value Ref Range   Glucose-Capillary 71 70 - 99 mg/dL  Glucose, capillary     Status: None   Collection Time: 04/22/14  9:31 AM  Result Value Ref Range   Glucose-Capillary 80 70 - 99 mg/dL  Glucose, capillary     Status: None   Collection Time: 04/22/14 11:46 AM  Result Value Ref Range   Glucose-Capillary 83 70 - 99 mg/dL    No  results found.  Assessment/Plan 1 paroxysmal atrial fibrillation- CHADs vasc 4. Her initial atrial fibrillation may have been related to the stress of her fall. Certainly she would benefit from anticoagulation given her multiple embolic risk factors. However she is bipolar and is confused. It is not clear to me whether she would be a good candidate. I would not begin anticoagulation at this point but instead continue aspirin. I will see her back in the office and we can make a decision when she recovers from this event. TSH was low. Question contribution to atrial fibrillation. They also have been euthyroid sick. Would check free T3 and free T4. If hyperthyroid she would need therapy. Change metoprolol to 25 mg by mouth twice a day. 2 mildly reduced LV function-continue beta blocker. We will plan to repeat in the future when she recovers from her present event. 3 bipolar disorder-management per primary care. 4 hypertension-increase metoprolol to 25 mg by mouth twice a day. Patient can follow-up with me following discharge for cardiac issues. We will sign off. Please call with questions. Kirk Ruths MD 04/22/2014, 5:09 PM

## 2014-04-22 NOTE — Plan of Care (Signed)
Problem: Phase I Progression Outcomes Goal: Voiding-avoid urinary catheter unless indicated Outcome: Completed/Met Date Met:  04/22/14 Voiding frequently today.

## 2014-04-23 ENCOUNTER — Inpatient Hospital Stay (HOSPITAL_COMMUNITY): Payer: Medicare Other

## 2014-04-23 LAB — COMPREHENSIVE METABOLIC PANEL
ALT: 40 U/L — ABNORMAL HIGH (ref 0–35)
AST: 33 U/L (ref 0–37)
Albumin: 2.7 g/dL — ABNORMAL LOW (ref 3.5–5.2)
Alkaline Phosphatase: 56 U/L (ref 39–117)
Anion gap: 11 (ref 5–15)
BILIRUBIN TOTAL: 0.5 mg/dL (ref 0.3–1.2)
BUN: 5 mg/dL — ABNORMAL LOW (ref 6–23)
CO2: 24 mmol/L (ref 19–32)
Calcium: 9.4 mg/dL (ref 8.4–10.5)
Chloride: 109 mmol/L (ref 96–112)
Creatinine, Ser: 0.57 mg/dL (ref 0.50–1.10)
GFR calc Af Amer: >90 mL/min (ref >90)
Glucose, Bld: 97 mg/dL (ref 70–99)
POTASSIUM: 3.3 mmol/L — AB (ref 3.5–5.1)
Sodium: 144 mmol/L (ref 135–145)
Total Protein: 6 g/dL (ref 6.0–8.3)

## 2014-04-23 LAB — GLUCOSE, CAPILLARY
GLUCOSE-CAPILLARY: 82 mg/dL (ref 70–99)
Glucose-Capillary: 91 mg/dL (ref 70–99)
Glucose-Capillary: 87 mg/dL (ref 70–99)
Glucose-Capillary: 106 mg/dL — ABNORMAL HIGH (ref 70–99)

## 2014-04-23 LAB — CBC
HCT: 37.9 % (ref 36.0–46.0)
HEMOGLOBIN: 12.4 g/dL (ref 12.0–15.0)
MCH: 30 pg (ref 26.0–34.0)
MCHC: 32.7 g/dL (ref 30.0–36.0)
MCV: 91.5 fL (ref 78.0–100.0)
Platelets: 278 10*3/uL (ref 150–400)
RBC: 4.14 MIL/uL (ref 3.87–5.11)
RDW: 13.5 % (ref 11.5–15.5)
WBC: 7 10*3/uL (ref 4.0–10.5)

## 2014-04-23 LAB — T4, FREE: FREE T4: 1.43 ng/dL (ref 0.80–1.80)

## 2014-04-23 MED ORDER — POTASSIUM CHLORIDE CRYS ER 20 MEQ PO TBCR
40.0000 meq | EXTENDED_RELEASE_TABLET | Freq: Once | ORAL | Status: AC
  Administered 2014-04-23: 40 meq via ORAL
  Filled 2014-04-23: qty 2

## 2014-04-23 MED ORDER — IPRATROPIUM-ALBUTEROL 0.5-2.5 (3) MG/3ML IN SOLN: 3.0000 mL | RESPIRATORY_TRACT | Status: DC | PRN

## 2014-04-23 MED ORDER — FUROSEMIDE 10 MG/ML IJ SOLN
40.0000 mg | Freq: Once | INTRAMUSCULAR | Status: AC
  Administered 2014-04-23: 40 mg via INTRAVENOUS
  Filled 2014-04-23: qty 4

## 2014-04-23 MED ORDER — LEVOTHYROXINE SODIUM 50 MCG PO TABS
175.0000 ug | ORAL_TABLET | Freq: Every day | ORAL | Status: DC
  Administered 2014-04-24: 175 ug
  Filled 2014-04-23 (×3): qty 1

## 2014-04-23 NOTE — Plan of Care (Signed)
Problem: Phase II Progression Outcomes Goal: Progress activity as tolerated unless otherwise ordered Outcome: Progressing Walking with assistance well.

## 2014-04-23 NOTE — Plan of Care (Signed)
Problem: Phase II Progression Outcomes Goal: IV changed to normal saline lock Outcome: Completed/Met Date Met:  04/23/14 Changed to saline lock.

## 2014-04-23 NOTE — Progress Notes (Signed)
CSW discussed the patient with the Attending who stated patient was not ready for discharge, but patient recommended by PT and OT for SNF and then return home.  CSW contacted the patient's daughter who was dubious about a SNF placement.  CSW explained what a SNF was and that it would only be a short-term rehab placement in order to get her mother stronger so she could return to independent living.  Patient's daughter visited with patient and admitted that her mother was not strong enough to return home currently, but has concerns about where the placement would be.    CSW completed and faxed off FL-2 to [place] requested but not returned at this time.  Due to being the weekend and the patient having a Bipolar I diagnosis the [facility] will not be returned until Monday.  Also, due to patient having [facility] Medicare a prior authorization will be needed that will not be granted till Monday as well.  CSW let patient's daughter know that as soon as bed offers are available they will be shared with her and the patient.  CSW available as needed.  [facility] Berda Shelvin [facility] CSW [phone]

## 2014-04-23 NOTE — Progress Notes (Signed)
PROGRESS NOTE  Kelsey Hanna GYF:[id number] DOB: 12/28/1950 DOA: 04/18/2014 PCP: Pcp Not In System  HPI/Recap of past 24 hours: Patient is poor historian, confabulate, only oriented to person, denies pain.  seems to have paranoia, does report intermittent ankle edema, when asked what she takes for edema, she replies "any thing" when pressed for answer of specific medicine , she replies she "take tylenol" for ankle edema.   Again Noted intermittent nonproductive cough during encounter.  Assessment/Plan: Principal Problem:   Bipolar I disorder, most recent episode (or current) unspecified Active Problems:   Acute encephalopathy   Altered mental status   Acute respiratory failure   Encounter for feeding tube placement   Rapid atrial fibrillation   Shortness of breath  Brief summary of hospital stay prior to transfer to hospitalist service: patient was found on floor for unclear duration on 3/1, intubated by EMS, send to ER found to be in rapid afib vs SVT, converted to sinus rhythm after cardizem. however has persistent hypotension, patient was admitted to ICU. Patient was extubated on 3/2, remain stayed in ICU due to difficulty to control agitation. Psychiatry consulted in the ICU recommended add seroquel/haldol. Patient agitation seems improved and patient is transferred to hospitalist service.  Unresponsiveness:  Resolved. likely secondary to drug overdose. Patient was intubated on 3/1 and extubated on 3/2. EEG no seizure. Diet modified to dysphagia diet.   Cough: nonproductive. aspiration pna? Chronic smoker. Lung exam diminished left base. CTA on admission with LLL infiltrate and mild bilateral pleural effusion, patient received vancx1day? Per icu note, unasyn x 4days from admission. Will repeat cxr pa/lateral on 3/5, add mucinex/duoneb for now. No fever, no leukocytosis.  Ct chest shower mild bilateral pleural effusion, iv lasix today, will need to be discharged on oral  lasix.  Brief Afib vs SVT? CHADsVASC score 4. Appreciate cardiology recommendation. Continue lopressor/high dose asa.  Free t4 wnl. Keep K>4, mag>2. Cardiology signed off, recommend Outpatient cardiology follow up.  NIDDM2, a1c pending. Hold metformin. On ssi here.  HTN:  Titrate lopressor and lisinopril. Start lasix on 3/6  Hypothyroidism: tsh low, free t 4 wnl, resume synthroid supplement.  Psych: h/o bipolar, currently confabulate , oriented only to person, but no agitation this am, denies SI/HI. Sitter in room. Change to oral depakote at home dose, now that patient able to eat. Continue Seroquel. Monitor QTc.  Deconditioning: continue PT/OT/speech. SNF placement.  Smoker: on nicotine patch.  MRSA colonization: contact precuation, bactroban/chlorhexidine  Hypok, replace k.    Code Status: full  Family Communication: patient  Disposition Plan: SNF   Consultants:  Cardiology  Psychiatry  PCCM  Procedures:  Intubated 3/1, extubation 3/1  Antibiotics:  vanc on admissionx1  unasyn from 3/1 to 3/5   Objective: BP 170/80 mmHg  Pulse 85  Temp(Src) 98.2 F (36.8 C) (Oral)  Resp 18  Ht 5\' 7"  (1.702 m)  Wt 80.74 kg (178 lb)  BMI 27.87 kg/m2  SpO2 93%  Intake/Output Summary (Last 24 hours) at 04/23/14 1455 Last data filed at 04/23/14 1418  Gross per 24 hour  Intake    360 ml  Output   1000 ml  Net   -640 ml   Filed Weights   04/21/14 0438 04/22/14 0514 04/23/14 0500  Weight: 88.6 kg (195 lb 5.2 oz) 83.008 kg (183 lb) 80.74 kg (178 lb)    Exam:  General: Chronically ill-appearing female, awake, agitation resolved Neuro: oriented to person only, follows commands, no focal deficit HEENT: jvd wnl  Cardiovascular: S1S2, no MGR. Distal pulses intact.  Lungs: decreased breath sound, more at left base. Abdomen: Soft, bowel sounds present.  Musculoskeletal: left ankle splinted.  Skin: BUE mottled. BLE with chronic edema? nonpitting    Data  Reviewed: Basic Metabolic Panel:  Recent Labs Lab 04/18/14 1148 04/18/14 1151  04/19/14 0300 04/19/14 0945 04/19/14 1753 04/20/14 0449 04/20/14 1655 04/21/14 0243 04/23/14 0400  NA 139 140  --   --  140  --  140  --  141 144  K 4.3 4.3  --   --  3.8  --  3.8  --  3.7 3.3*  CL 105 103  --   --  113*  --  107  --  102 109  CO2 24  --   --   --  23  --  27  --  30 24  GLUCOSE 151* 155*  --   --  110*  --  107*  --  122* 97  BUN 13 16  --   --  7  --  <5*  --  <5* 5*  CREATININE 0.93 0.90  --   --  0.61  --  0.46*  --  0.48* 0.57  CALCIUM 9.7  --   --   --  8.5  --  8.5  --  8.7 9.4  MG 2.0  --   < > 1.8  --  2.0 1.7 2.3 1.8  --   PHOS  --   --   < > 2.1*  --  4.9* 4.4 4.4 4.2  --   < > = values in this interval not displayed. Liver Function Tests:  Recent Labs Lab 04/18/14 1148 04/20/14 0449 04/23/14 0400  AST 26 75* 33  ALT 17 39* 40*  ALKPHOS 62 47 56  BILITOT 0.4 0.5 0.5  PROT 7.0 5.2* 6.0  ALBUMIN 3.6 2.5* 2.7*   No results for input(s): LIPASE, AMYLASE in the last 168 hours. No results for input(s): AMMONIA in the last 168 hours. CBC:  Recent Labs Lab 04/18/14 1148 04/18/14 1151 04/19/14 0945 04/20/14 0449 04/23/14 0400  WBC 12.8*  --  8.8 8.2 7.0  NEUTROABS 9.1*  --   --  4.9  --   HGB 15.0 17.7* 11.3* 12.3 12.4  HCT 47.4* 52.0* 34.9* 38.6 37.9  MCV 96.9  --  94.3 94.4 91.5  PLT 283  --  193 179 278   Cardiac Enzymes:    Recent Labs Lab 04/18/14 1148 04/18/14 2016 04/19/14 0300 04/19/14 0945  CKTOTAL 611* 1610* 3741* 3912*  TROPONINI <0.03  --   --   --    BNP (last 3 results)  Recent Labs  04/01/14 2018 04/18/14 1148  BNP 37.7 146.4*    ProBNP (last 3 results) No results for input(s): PROBNP in the last 8760 hours.  CBG:  Recent Labs Lab 04/22/14 0931 04/22/14 1146 04/22/14 1620 04/23/14 0734 04/23/14 1315  GLUCAP 80 83 96 91 87    Recent Results (from the past 240 hour(s))  Culture, blood (routine x 2)     Status: None  (Preliminary result)   Collection Time: 04/18/14  3:42 PM  Result Value Ref Range Status   Specimen Description BLOOD LEFT FOREARM  Final   Special Requests BOTTLES DRAWN AEROBIC AND ANAEROBIC 3CC  Final   Culture   Final           BLOOD CULTURE RECEIVED NO GROWTH TO DATE CULTURE WILL BE HELD FOR  5 DAYS BEFORE ISSUING A FINAL NEGATIVE REPORT Performed at [facility]    Report Status PENDING  Incomplete  Culture, blood (routine x 2)     Status: None (Preliminary result)   Collection Time: 04/18/14  3:59 PM  Result Value Ref Range Status   Specimen Description BLOOD HAND LEFT  Final   Special Requests BOTTLES DRAWN AEROBIC ONLY 2CC  Final   Culture   Final           BLOOD CULTURE RECEIVED NO GROWTH TO DATE CULTURE WILL BE HELD FOR 5 DAYS BEFORE ISSUING A FINAL NEGATIVE REPORT Performed at [facility]    Report Status PENDING  Incomplete  MRSA PCR Screening     Status: Abnormal   Collection Time: 04/18/14  6:38 PM  Result Value Ref Range Status   MRSA by PCR POSITIVE (A) NEGATIVE Final    Comment:        The GeneXpert MRSA Assay (FDA approved for NASAL specimens only), is one component of a comprehensive MRSA colonization surveillance program. It is not intended to diagnose MRSA infection nor to guide or monitor treatment for MRSA infections. RESULT CALLED TO, READ BACK BY AND VERIFIED WITH: Bethann Humble RN [id number] 04/18/14 A BROWNING      Studies: Dg Chest 2 View  04/22/2014   CLINICAL DATA:  Dry cough for 2 weeks.  EXAM: CHEST  2 VIEW  COMPARISON:  04/19/2014 and multiple prior chest radiographs. 04/18/2014 chest CT.  FINDINGS: The cardiomediastinal silhouette is unchanged.  Increasing left lower lung atelectasis/ airspace disease/ consolidation noted.  There is no evidence of pneumothorax or pulmonary edema.  No other significant changes identified.  IMPRESSION: Increasing left lower lobe atelectasis versus airspace disease/ consolidation.   Electronically Signed   By:  Margarette Canada M.D.   On: 04/22/2014 18:50    Scheduled Meds: . aspirin EC  325 mg Oral Daily  . Chlorhexidine Gluconate Cloth  6 each Topical Q0600  . divalproex  1,000 mg Oral QHS  . divalproex  500 mg Oral q morning - 10a  . furosemide  40 mg Intravenous Once  . guaiFENesin  600 mg Oral BID  . insulin aspart  2-6 Units Subcutaneous TID WC  . [START ON 04/24/2014] levothyroxine  175 mcg Per Tube QAC breakfast  . lisinopril  5 mg Oral Daily  . metoprolol tartrate  25 mg Oral BID WC  . mupirocin ointment  1 application Nasal BID  . nicotine  14 mg Transdermal Daily  . potassium chloride  40 mEq Oral Once  . QUEtiapine  100 mg Oral BID    Continuous Infusions:  stopped on 3/5, patient now passed swallow eval, able to eat.     Forney Kleinpeter  [facility] Pager [phone]. If 7PM-7AM, please contact night-coverage at [email or web address], password [facility] 04/23/2014, 2:55 PM  LOS: 5 days

## 2014-04-24 LAB — CULTURE, BLOOD (ROUTINE X 2)
Culture: NO GROWTH
Culture: NO GROWTH

## 2014-04-24 LAB — CBC
HCT: 39.5 % (ref 36.0–46.0)
Hemoglobin: 13.1 g/dL (ref 12.0–15.0)
MCH: 30.3 pg (ref 26.0–34.0)
MCHC: 33.2 g/dL (ref 30.0–36.0)
MCV: 91.2 fL (ref 78.0–100.0)
PLATELETS: 304 10*3/uL (ref 150–400)
RBC: 4.33 MIL/uL (ref 3.87–5.11)
RDW: 13.5 % (ref 11.5–15.5)
WBC: 7.3 10*3/uL (ref 4.0–10.5)

## 2014-04-24 LAB — COMPREHENSIVE METABOLIC PANEL
ALT: 33 U/L (ref 0–35)
AST: 23 U/L (ref 0–37)
Albumin: 2.8 g/dL — ABNORMAL LOW (ref 3.5–5.2)
Alkaline Phosphatase: 54 U/L (ref 39–117)
Anion gap: 7 (ref 5–15)
BILIRUBIN TOTAL: 0.5 mg/dL (ref 0.3–1.2)
BUN: 10 mg/dL (ref 6–23)
CO2: 25 mmol/L (ref 19–32)
Calcium: 9.3 mg/dL (ref 8.4–10.5)
Chloride: 110 mmol/L (ref 96–112)
Creatinine, Ser: 0.66 mg/dL (ref 0.50–1.10)
GFR calc Af Amer: >90 mL/min (ref >90)
GFR calc non Af Amer: >90 mL/min (ref >90)
GLUCOSE: 91 mg/dL (ref 70–99)
Potassium: 3.5 mmol/L (ref 3.5–5.1)
Sodium: 142 mmol/L (ref 135–145)
Total Protein: 6.2 g/dL (ref 6.0–8.3)

## 2014-04-24 LAB — GLUCOSE, CAPILLARY
Glucose-Capillary: 73 mg/dL (ref 70–99)
Glucose-Capillary: 82 mg/dL (ref 70–99)
Glucose-Capillary: 93 mg/dL (ref 70–99)

## 2014-04-24 LAB — HEMOGLOBIN A1C
HEMOGLOBIN A1C: 5.4 % (ref 4.8–5.6)
MEAN PLASMA GLUCOSE: 108 mg/dL

## 2014-04-24 LAB — MAGNESIUM: Magnesium: 1.7 mg/dL (ref 1.5–2.5)

## 2014-04-24 MED ORDER — POTASSIUM CHLORIDE ER 10 MEQ PO TBCR: 20.0000 meq | EXTENDED_RELEASE_TABLET | Freq: Every day | ORAL | Status: DC

## 2014-04-24 MED ORDER — CHLORHEXIDINE GLUCONATE CLOTH 2 % EX PADS: 6.0000 | MEDICATED_PAD | Freq: Every day | CUTANEOUS | Status: AC

## 2014-04-24 MED ORDER — METOPROLOL TARTRATE 50 MG PO TABS: 50.0000 mg | ORAL_TABLET | Freq: Two times a day (BID) | ORAL | Status: DC

## 2014-04-24 MED ORDER — ASPIRIN 325 MG PO TBEC: 325.0000 mg | DELAYED_RELEASE_TABLET | Freq: Every day | ORAL | Status: DC

## 2014-04-24 MED ORDER — METOPROLOL TARTRATE 50 MG PO TABS
50.0000 mg | ORAL_TABLET | Freq: Two times a day (BID) | ORAL | Status: DC
  Administered 2014-04-24: 50 mg via ORAL
  Filled 2014-04-24: qty 1

## 2014-04-24 MED ORDER — DULOXETINE HCL 30 MG PO CPEP: 30.0000 mg | ORAL_CAPSULE | Freq: Every day | ORAL | Status: DC

## 2014-04-24 MED ORDER — MUPIROCIN 2 % EX OINT
1.0000 | TOPICAL_OINTMENT | Freq: Two times a day (BID) | CUTANEOUS | Status: DC
Start: 2014-04-24 — End: 2014-08-30

## 2014-04-24 MED ORDER — QUETIAPINE FUMARATE 100 MG PO TABS: 100.0000 mg | ORAL_TABLET | Freq: Two times a day (BID) | ORAL | Status: DC

## 2014-04-24 MED ORDER — LISINOPRIL 5 MG PO TABS: 5.0000 mg | ORAL_TABLET | Freq: Every day | ORAL | Status: DC

## 2014-04-24 MED ORDER — CYCLOBENZAPRINE HCL 10 MG PO TABS: 10.0000 mg | ORAL_TABLET | Freq: Three times a day (TID) | ORAL | Status: DC | PRN

## 2014-04-24 NOTE — Consult Note (Signed)
Psychiatry Consult follow-up note  Reason for Consult:  Bipolar disorder, seizure and AMS Referring Physician:  Dr. Erlinda Hong Patient Identification: Kelsey Hanna MRN:  [id number] Principal Diagnosis: Bipolar I disorder, most recent episode (or current) unspecified Diagnosis:   Patient Active Problem List   Diagnosis Date Noted  . Bipolar I disorder, most recent episode (or current) unspecified [F31.9] 04/20/2014  . Acute encephalopathy [G93.40] 04/18/2014  . Altered mental status [R41.82]   . Acute respiratory failure [J96.00]   . Encounter for feeding tube placement [Z87.898]   . Rapid atrial fibrillation [I48.91]   . Shortness of breath [R06.02]     Total Time spent with patient: 30 minutes  Subjective:   Kelsey Hanna is a 64 y.o. female patient admitted with Bipolar disorder, seizure and AMS.  HPI:  Kelsey Hanna is a 64 year old female seen and chart reviewed for psychiatric consultation and evaluation. Patient was admitted to [facility] intensive care unit secondary to altered mental status. Patient has been diagnosed with bipolar disorder and also history of seizure disorder. Patient was not able to participate in psychiatric evaluation today because of increased sedation secondary to her current medication management. Case was discussed with the Dr. Titus Mould and and staff RN recommended appropriate medication regimen for controlling agitation and aggressive behaviors. Staff RN reported that patient received intramuscular medication for increased agitation and aggressive behavior this morning. Patient was required intubation for airway protection in emergency department and also has SVT 230's which required correction. Patient has no family members at bedside will ask psychiatric social service to contact family members when possible. Patient urine drug screen is positive for benzodiazepines and opiates. Blood alcohol level is not significant. Patient previous acute psychiatric  hospitalization at behavioral health hospitalist 2007 and 2008.  04/24/14 Interval history: Patient seen, chart reviewed for psychiatric consultation follow-up. Patient daughter daughter was not at bedside today. Patient stated she wasn't happy and denies symptoms of depression and anxiety today. Patient has a poor cognitions and unable to understand that she was in hospital and she needed a treatment. Patient is able to eat and drink fine without problems and also reportedly sleeping good. Patient appeared somewhat grandiose during this evaluation and talks about school and education. She was receiving outpatient psychiatric services from the [facility] with [facility], which were transferred to the [facility]. Patient lives alone in an independent apartment and CNA will check on her medication monitoring from time to time. Patient daughter is also supportive and check on her from time to time. Reportedly she was receiving a lot of pain medication which may have confused her. Patient denied suicidal, homicidal ideation, intentions or plans. Patient is a poor historian. Patient daughter is also stated patient has some kind of personality issues. Patient daughter does not want to change her home psychiatric medication because she is doing well at home before coming to the hospital.   Medical history: Patient with h/o bipolar disorder presented to the ED via EMS after being found on floor at home, last seen normal at 2200 on 2/29. En route to ED became unresponsive. On arrival to ED was intubated for airway protection, found to be in SVT 230s. Converted w/ CCB, but hypotensive after. PCCM asked to admit  Past Medical History:  Past Medical History  Diagnosis Date  . Bipolar 1 disorder   . Hypothyroid   . Dyslipidemia   . Retinal detachment   . Sleep apnea   . Diabetes mellitus  dx within last yr....just takes pills  . Cancer     cancer of uterus...no chemo or radiation     Past Surgical History  Procedure Laterality Date  . Abdominal hysterectomy    . Total abdominal hysterectomy w/ bilateral salpingoophorectomy    . Tonsillectomy    . Lumbar laminectomy/decompression microdiscectomy  07/17/2011    Procedure: LUMBAR LAMINECTOMY/DECOMPRESSION MICRODISCECTOMY;  Surgeon: Sinclair Ship, MD;  Location: [facility];  Service: Orthopedics;  Laterality: Left;  Left sided lumbar 4-5 microdisectomy   Family History: No family history on file. Social History:  History  Alcohol Use No    Comment: socially     History  Drug Use No    History   Social History  . Marital Status: Divorced    Spouse Name: N/A  . Number of Children: N/A  . Years of Education: N/A   Social History Main Topics  . Smoking status: Former Smoker -- 2.00 packs/day for 35 years    Types: Cigarettes    Quit date: 07/09/1999  . Smokeless tobacco: Not on file  . Alcohol Use: No     Comment: socially  . Drug Use: No  . Sexual Activity: Not on file   Other Topics Concern  . None   Social History Narrative   Additional Social History:   Allergies:   Allergies  Allergen Reactions  . Lithium Other (See Comments)    "makes me feel spaced out" slurred speech    Vitals: Blood pressure 146/101, pulse 88, temperature 97.7 F (36.5 C), temperature source Oral, resp. rate 24, height 5\' 7"  (1.702 m), weight 80.74 kg (178 lb), SpO2 100 %.  Risk to Self:   Risk to Others:   Prior Inpatient Therapy:   Prior Outpatient Therapy:    Current Facility-Administered Medications  Medication Dose Route Frequency Provider Last Rate Last Dose  . aspirin EC tablet 325 mg  325 mg Oral Daily Florencia Reasons, MD   325 mg at 04/24/14 0941  . Chlorhexidine Gluconate Cloth 2 % PADS 6 each  6 each Topical Q0600 Raylene Miyamoto, MD   6 each at 04/24/14 0645  . divalproex (DEPAKOTE) DR tablet 1,000 mg  1,000 mg Oral QHS Florencia Reasons, MD   1,000 mg at 04/23/14 2156  . divalproex (DEPAKOTE) DR tablet 500 mg  500  mg Oral q morning - 10a Florencia Reasons, MD   500 mg at 04/24/14 0939  . guaiFENesin (MUCINEX) 12 hr tablet 600 mg  600 mg Oral BID Florencia Reasons, MD   600 mg at 04/24/14 0941  . insulin aspart (novoLOG) injection 2-6 Units  2-6 Units Subcutaneous TID WC Florencia Reasons, MD   2 Units at 04/22/14 1200  . ipratropium-albuterol (DUONEB) 0.5-2.5 (3) MG/3ML nebulizer solution 3 mL  3 mL Nebulization Q4H PRN Florencia Reasons, MD      . levothyroxine (SYNTHROID, LEVOTHROID) tablet 175 mcg  175 mcg Per Tube QAC breakfast Florencia Reasons, MD   175 mcg at 04/24/14 0806  . lisinopril (PRINIVIL,ZESTRIL) tablet 5 mg  5 mg Oral Daily Corky Sox, MD   5 mg at 04/24/14 0941  . metoprolol (LOPRESSOR) tablet 50 mg  50 mg Oral BID WC Florencia Reasons, MD   50 mg at 04/24/14 0807  . mupirocin ointment (BACTROBAN) 2 % 1 application  1 application Nasal BID Raylene Miyamoto, MD   1 application at 09/[birth year]/45 [phone]  . nicotine (NICODERM CQ - dosed in mg/24 hours) patch 14 mg  14  mg Transdermal Daily Corky Sox, MD   14 mg at 04/24/14 0940  . QUEtiapine (SEROQUEL) tablet 100 mg  100 mg Oral BID Corky Sox, MD   100 mg at 04/24/14 0941    Musculoskeletal: Strength & Muscle Tone: decreased Gait & Station: unable to stand Patient leans: N/A  Psychiatric Specialty Exam: Physical Exam as per history and physical :  ROS: Mood swings, agitation and both physical and verbal aggression   Blood pressure 146/101, pulse 88, temperature 97.7 F (36.5 C), temperature source Oral, resp. rate 24, height 5\' 7"  (1.702 m), weight 80.74 kg (178 lb), SpO2 100 %.Body mass index is 27.87 kg/(m^2).  General Appearance: Guarded  Eye Contact::  Good  Speech:  Clear and Coherent and Slow  Volume:  Decreased  Mood:  Anxious and Depressed  Affect:  Appropriate and Congruent  Thought Process:  Coherent and Goal Directed  Orientation:  Full (Time, Place, and Person)  Thought Content:  WDL  Suicidal Thoughts:  No  Homicidal Thoughts:  No  Memory:  Immediate;   Fair Recent;   Poor   Judgement:  Impaired  Insight:  Lacking  Psychomotor Activity:  Decreased  Concentration:  Fair  Recall:  [place] of Knowledge:Fair  Language: Good  Akathisia:  Negative  Handed:  Right  AIMS (if indicated):     Assets:  Communication Skills Desire for Improvement Financial Resources/Insurance Housing Intimacy Leisure Time Resilience Social Support  ADL's:  Impaired  Cognition: Impaired,  Mild  Sleep:      Medical Decision Making: New problem, with additional work up planned, Review of Psycho-Social Stressors (1), Review or order clinical lab tests (1), Established Problem, Worsening (2), Review or order medicine tests (1), Review of Medication Regimen & Side Effects (2) and Review of New Medication or Change in Dosage (2)  Treatment Plan Summary: Daily contact with patient to assess and evaluate symptoms and progress in treatment and Medication management  Plan:  Continue Depakote ER 500 mg daily morning and 1000 mgs at bedtime  Continue Seroquel 100 mg twice daily We will start Cymbalta 30 mg daily by mouth  Patient does not meet criteria for psychiatric inpatient admission. Supportive therapy provided about ongoing stressors. Appreciate psychiatric consultation and follow up as clinically required Please contact [phone] or [phone] if needs further assistance  Disposition: Patient benefit from skilled nursing facility and medically stable  Eveline Sauve,JANARDHAHA R. 04/24/2014 12:17 PM

## 2014-04-24 NOTE — Clinical Social Work Placement (Signed)
Clinical Social Work Department CLINICAL SOCIAL WORK PLACEMENT NOTE 04/24/2014  Patient:  Kelsey Hanna, Kelsey Hanna  Account Number:  [id number] Admit date:  04/18/2014  Clinical Social Worker:  Pang Robers, LCSWA  Date/time:  04/24/2014 05:27 PM  Clinical Social Work is seeking post-discharge placement for this patient at the following level of care:   SKILLED NURSING   (*CSW will update this form in Epic as items are completed)   04/24/2014  Patient/family provided with [place] Department of Clinical Social Work's list of facilities offering this level of care within the geographic area requested by the patient (or if unable, by the patient's family).  04/24/2014  Patient/family informed of their freedom to choose among providers that offer the needed level of care, that participate in Medicare, Medicaid or managed care program needed by the patient, have an available bed and are willing to accept the patient.  04/24/2014  Patient/family informed of [facility]' ownership interest in [facility], as well as of the fact that they are under no obligation to receive care at this facility.  PASARR submitted to EDS on 04/22/2014 PASARR number received on 04/24/2014  FL2 transmitted to all facilities in geographic area requested by pt/family on  04/24/2014 FL2 transmitted to all facilities within larger geographic area on 04/24/2014  Patient informed that his/her managed care company has contracts with or will negotiate with  certain facilities, including the following:     Patient/family informed of bed offers received:  04/24/2014 Patient chooses bed at [facility] Physician recommends and patient chooses bed at    Patient to be transferred to [place] on  04/24/2014 Patient to be transferred to facility by [facility] Patient and family notified of transfer on 04/24/2014 Name of family member notified:  Daughter Adrian Prows  The following physician request were entered in Epic:   Additional Comments:   Jones Broom. [place], MSW, [place] 04/24/2014 5:29 PM

## 2014-04-24 NOTE — Clinical Social Work Note (Signed)
Patient to be d/c'ed today to [facility].  Patient and family agreeable to plans will transport via ems RN to call report.  Notified patient, her daughter and facility.  Evette Cristal, MSW, [place]

## 2014-04-24 NOTE — Progress Notes (Signed)
Physical Therapy Treatment Patient Details Name: Kelsey Hanna MRN: [id number] DOB: Oct 19, 1950 Today's Date: 04/24/2014    History of Present Illness 64 year old female with h/o bipolar disorder presented to the ED via EMS after being found on floor at home, last seen normal at 2200 on 2/29. En route to ED became unresponsive. On arrival to ED was intubated for airway protection, found to be in SVT 230s. Converted w/ CCB, but hypotensive after.    PT Comments    Patient progressing well with mobility. Improved ambulation distance. Continues to require Min A for ambulation and RW management. Fatigues easily however no reports of dizziness or knee buckling. Continues to be confused. Appropriate for ST SNF as pt not safe to be home alone. Will continue to follow per POC.   Follow Up Recommendations  SNF     Equipment Recommendations  None recommended by PT    Recommendations for Other Services       Precautions / Restrictions Precautions Precautions: Fall Restrictions Weight Bearing Restrictions: No    Mobility  Bed Mobility               General bed mobility comments: Received sitting in chair upon PT arrival.   Transfers Overall transfer level: Needs assistance Equipment used: Rolling walker (2 wheeled) Transfers: Sit to/from Stand Sit to Stand: Min guard         General transfer comment: Min guard for safety. Stood from Youth worker.  Ambulation/Gait Ambulation/Gait assistance: Min assist Ambulation Distance (Feet): 150 Feet Assistive device: Rolling walker (2 wheeled) Gait Pattern/deviations: Step-through pattern;Decreased stride length;Trunk flexed;Shuffle;Staggering right;Narrow base of support   Gait velocity interpretation: Below normal speed for age/gender General Gait Details: Cues to increase stride length and for RW management as pt running into objects/doors on right side. Difficulty problem solving how to free RW. No dizziness reported.  Unsteady.   Stairs            Wheelchair Mobility    Modified Rankin (Stroke Patients Only)       Balance Overall balance assessment: Needs assistance Sitting-balance support: Feet supported;No upper extremity supported Sitting balance-Leahy Scale: Good     Standing balance support: During functional activity Standing balance-Leahy Scale: Poor                      Cognition Arousal/Alertness: Awake/alert Behavior During Therapy: WFL for tasks assessed/performed Overall Cognitive Status: No family/caregiver present to determine baseline cognitive functioning Area of Impairment: Orientation Orientation Level: Disoriented to;Place;Time;Situation                  Exercises      General Comments        Pertinent Vitals/Pain Pain Assessment: Faces Pain Score: 3  Pain Location: hip Pain Descriptors / Indicators: Sore Pain Intervention(s): Monitored during session    Home Living                      Prior Function            PT Goals (current goals can now be found in the care plan section) Progress towards PT goals: Progressing toward goals    Frequency  Min 3X/week    PT Plan Current plan remains appropriate    Co-evaluation             End of Session Equipment Utilized During Treatment: Gait belt Activity Tolerance: Patient tolerated treatment well Patient left: in chair;with call bell/phone within  reach;with chair alarm set     Time: 7614-7092 PT Time Calculation (min) (ACUTE ONLY): 17 min  Charges:  $Gait Training: 8-22 mins                    G CodesCandy Sledge A 05/15/14, 11:17 AM  Candy Sledge, PT, DPT [phone]

## 2014-04-24 NOTE — Progress Notes (Signed)
Called report to [facility]. Pt will be ready for discharge when non-emergent ambulance transport arrives.

## 2014-04-24 NOTE — Discharge Summary (Addendum)
Discharge Summary  Kelsey Hanna XLK:[id number] DOB: Sep 26, 1950  PCP: Pcp Not In System  Admit date: 04/18/2014 Discharge date: 04/24/2014  Time spent: >2mins  Recommendations for Outpatient Follow-up:  1. F/u with pcp Dr. Horald Pollen in one week, pcp to repeat cbc/bmp, further adjust bp meds/lasix dose. pcp to f/u on a1c result which is pending at time of discharge. 2. F/u with cardiology Dr. Stanford Breed for proxysmal Afib 3. F/u with psychiatry Dr. Louretta Shorten in one week for bipolar disorder.  Discharge Diagnoses:  Active Hospital Problems   Diagnosis Date Noted  . Bipolar I disorder, most recent episode (or current) unspecified 04/20/2014  . Acute encephalopathy 04/18/2014  . Altered mental status   . Acute respiratory failure   . Encounter for feeding tube placement   . Rapid atrial fibrillation   . Shortness of breath     Resolved Hospital Problems   Diagnosis Date Noted Date Resolved  No resolved problems to display.    Discharge Condition: stable, improved  Diet recommendation: dysphagia 2 diet/heart healthy   Filed Weights   04/21/14 0438 04/22/14 0514 04/23/14 0500  Weight: 88.6 kg (195 lb 5.2 oz) 83.008 kg (183 lb) 80.74 kg (178 lb)    History of present illness:  64 year old female with h/o bipolar disorder presented to the ED via EMS after being found on floor at home, last seen normal at 2200 on 2/29. En route to ED became unresponsive. On arrival to ED was intubated for airway protection, found to be in SVT/afibRVR 230s. Converted w/ CCB, but hypotensive after. PCCM asked to admit.   Hospital Course:  Principal Problem:   Bipolar I disorder, most recent episode (or current) unspecified Active Problems:   Acute encephalopathy   Altered mental status   Acute respiratory failure   Encounter for feeding tube placement   Rapid atrial fibrillation   Shortness of breath  Brief summary of hospital stay prior to transfer to hospitalist service: patient  was found on floor for unclear duration on 3/1, intubated by EMS, send to ER found to be in rapid afib vs SVT, converted to sinus rhythm after cardizem. however has persistent hypotension, patient was admitted to ICU. Patient was extubated on 3/2, remain stayed in ICU due to difficulty to control agitation. Psychiatry consulted in the ICU recommended add seroquel/haldol. Patient agitation seems improved and patient is transferred to hospitalist service.  Unresponsiveness: Resolved. likely secondary to drug overdose. Patient was intubated on 3/1 and extubated on 3/2. EEG no seizure. Diet modified to dysphagia diet.   nonproductive Cough:  -- CTA on admission with LLL infiltrate and mild bilateral pleural effusion, patient received vancx1day? Per icu note, unasyn x 4days from admission for possible aspiration pneumonia, patient did have mild fever, mild leukocytosis and elevated lactic acid, which could be due to sepsis secondary to possible aspiration pneumonia.  --No fever, no leukocytosis at time of discharge. Repeat Ct chest 3/6 showed mild bilateral pleural effusion, iv lasix on 3/6/ 3/7, discharged on oral lasix. Cough resolved at time of discharge.  Brief Afib vs SVT? CHADsVASC score 4. Appreciate cardiology recommendation. Continue lopressor/high dose asa. Free t4 wnl. Keep K>4, mag>2. Outpatient cardiology follow up.  NIDDM2, a1c pending. On ssi in the hospital, metformin held in the hospital. Resumed at time of discharge.  HTN: Titrate lopressor and lisinopril. Start lasix on 3/6  Hypothyroidism: tsh low, free t 4 wnl, resume synthroid supplement.  Psych: h/o bipolar,  confabulate , oriented only to person, very  poor insight. but no agitation this am, denies SI/HI.  Continue oral depakote at home dose,  Seroquel started from this admission per psychiatry recommendation. Monitor QTc. Continue outpatient psychiatry follow up.  Deconditioning: continue PT/OT/speech. SNF  placement.  Smoker: on nicotine patch in the hospital  MRSA colonization: contact precuation, bactroban/chlorhexidine  Hypok, replace k.     Code Status: full  Family Communication: patient  Disposition Plan: SNF   Consultants:  Cardiology  Psychiatry  PCCM  Procedures:  Intubated 3/1, extubation 3/1  Antibiotics:  vanc on admissionx1  unasyn from 3/1 to 3/5  Discharge Exam: BP 146/101 mmHg  Pulse 88  Temp(Src) 97.7 F (36.5 C) (Oral)  Resp 24  Ht 5\' 7"  (1.702 m)  Wt 80.74 kg (178 lb)  BMI 27.87 kg/m2  SpO2 100%  General: Chronically ill-appearing female, awake, agitation resolved Neuro: oriented to person only, follows commands, no focal deficit HEENT: jvd wnl Cardiovascular: S1S2, no MGR. Distal pulses intact.  Lungs: decreased breath sound, more at left base. Abdomen: Soft, bowel sounds present.  Musculoskeletal: left ankle splinted.  Skin: BUE mottled. BLE with chronic edema? nonpitting    Discharge Instructions You were cared for by a hospitalist during your hospital stay. If you have any questions about your discharge medications or the care you received while you were in the hospital after you are discharged, you can call the unit and asked to speak with the hospitalist on call if the hospitalist that took care of you is not available. Once you are discharged, your primary care physician will handle any further medical issues. Please note that NO REFILLS for any discharge medications will be authorized once you are discharged, as it is imperative that you return to your primary care physician (or establish a relationship with a primary care physician if you do not have one) for your aftercare needs so that they can reassess your need for medications and monitor your lab values.      Discharge Instructions    Diet - low sodium heart healthy    Complete by:  As directed      Increase activity slowly    Complete by:  As directed              Medication List    STOP taking these medications        clindamycin 150 MG capsule  Commonly known as:  CLEOCIN     hydrochlorothiazide 25 MG tablet  Commonly known as:  HYDRODIURIL     HYDROcodone-acetaminophen 5-325 MG per tablet  Commonly known as:  NORCO/VICODIN     morphine 15 MG tablet  Commonly known as:  MSIR     morphine 30 MG tablet  Commonly known as:  MSIR      TAKE these medications        aspirin 325 MG EC tablet  Take 1 tablet (325 mg total) by mouth daily.     atorvastatin 40 MG tablet  Commonly known as:  LIPITOR  Take 40 mg by mouth every evening.     Chlorhexidine Gluconate Cloth 2 % Pads  Apply 6 each topically daily at 6 (six) AM.     colchicine 0.6 MG tablet  Take 0.6 mg by mouth daily.     cyclobenzaprine 10 MG tablet  Commonly known as:  FLEXERIL  Take 1 tablet (10 mg total) by mouth 3 (three) times daily as needed for muscle spasms.     divalproex 500 MG DR tablet  Commonly  known as:  DEPAKOTE  Take 500-1,000 mg by mouth 2 (two) times daily. Take 1 tablet in the morning and 2 tablets in the evening     DULoxetine 30 MG capsule  Commonly known as:  CYMBALTA  Take 30 mg by mouth daily.     furosemide 80 MG tablet  Commonly known as:  LASIX  Take 80 mg by mouth daily.     gabapentin 300 MG capsule  Commonly known as:  NEURONTIN  Take 300 mg by mouth 3 (three) times daily.     levothyroxine 175 MCG tablet  Commonly known as:  SYNTHROID, LEVOTHROID  Take 175 mcg by mouth daily before breakfast.     lisinopril 5 MG tablet  Commonly known as:  PRINIVIL,ZESTRIL  Take 1 tablet (5 mg total) by mouth daily.     metFORMIN 500 MG tablet  Commonly known as:  GLUCOPHAGE  Take 500 mg by mouth 2 (two) times daily with a meal.     metoprolol 50 MG tablet  Commonly known as:  LOPRESSOR  Take 1 tablet (50 mg total) by mouth 2 (two) times daily with a meal.     mupirocin ointment 2 %  Commonly known as:  BACTROBAN  Place 1  application into the nose 2 (two) times daily.     potassium chloride 10 MEQ tablet  Commonly known as:  K-DUR  Take 2 tablets (20 mEq total) by mouth daily.     PRESCRIPTION MEDICATION  Take 1 tablet by mouth at bedtime as needed (for sleep).     QUEtiapine 100 MG tablet  Commonly known as:  SEROQUEL  Take 1 tablet (100 mg total) by mouth 2 (two) times daily.     tapentadol 50 MG Tabs tablet  Commonly known as:  NUCYNTA  Take 50 mg by mouth every 6 (six) hours as needed.     VOLTAREN 1 % Gel  Generic drug:  diclofenac sodium  Apply 1 application topically daily.       Allergies  Allergen Reactions  . Lithium Other (See Comments)    "makes me feel spaced out" slurred speech   Follow-up Information    Follow up with Kirk Ruths, MD.   Specialty:  Cardiology   Why:  the office will call with date and time-  Cardiology   Contact information:   [address] [place] [address] [phone]       Follow up with Hayden Rasmussen., MD In 1 week.   Specialty:  Family Medicine   Contact information:   [place] [address] [phone]       Follow up with Durward Parcel., MD In 1 week.   Specialty:  Psychiatry   Contact information:   [address] [place] [address] [phone]        The results of significant diagnostics from this hospitalization (including imaging, microbiology, ancillary and laboratory) are listed below for reference.    Significant Diagnostic Studies: Dg Chest 2 View  04/22/2014   CLINICAL DATA:  Dry cough for 2 weeks.  EXAM: CHEST  2 VIEW  COMPARISON:  04/19/2014 and multiple prior chest radiographs. 04/18/2014 chest CT.  FINDINGS: The cardiomediastinal silhouette is unchanged.  Increasing left lower lung atelectasis/ airspace disease/ consolidation noted.  There is no evidence of pneumothorax or pulmonary edema.  No other significant changes identified.  IMPRESSION: Increasing left  lower lobe atelectasis versus airspace disease/ consolidation.   Electronically Signed   By:  Margarette Canada M.D.   On: 04/22/2014 18:50   Dg Chest 2 View  04/01/2014   CLINICAL DATA:  64 year old female with lower extremity swelling, fall, pain. Initial encounter.  EXAM: CHEST  2 VIEW  COMPARISON:  08/25/2013 and earlier.  FINDINGS: Upright AP and lateral views. Chronic multilevel left posterior rib deformities. Show low lung volumes on the lateral. Normal cardiac size and mediastinal contours. No pneumothorax, pulmonary edema, pleural effusion or consolidation. Basilar atelectasis. Please visible exostosis of the left humerus shaft. No acute osseous abnormality identified.  IMPRESSION: Low lung volumes with atelectasis, otherwise no acute cardiopulmonary abnormality.   Electronically Signed   By: Genevie Ann M.D.   On: 04/01/2014 19:43   Dg Ankle Complete Left  04/18/2014   CLINICAL DATA:  Found unresponsive with redness surrounding the ankle and swelling  EXAM: LEFT ANKLE COMPLETE - 3+ VIEW  COMPARISON:  10/17/2004, 04/19/2011  FINDINGS: There are changes consistent with the large osteochondral defect an subchondral fractures in the talus seen on prior MRI examination. No definitive fracture in the talus or calcaneus is noted. Generalized soft tissue swelling is seen. No other bony abnormality is noted.  IMPRESSION: Chronic changes in the talus similar to that seen on the prior MRI  Generalized soft tissue swelling is noted.   Electronically Signed   By: Inez Catalina M.D.   On: 04/18/2014 12:53   Ct Head Wo Contrast  04/18/2014   CLINICAL DATA:  Found on floor, decreasing level of consciousness  EXAM: CT HEAD WITHOUT CONTRAST  CT CERVICAL SPINE WITHOUT CONTRAST  TECHNIQUE: Multidetector CT imaging of the head and cervical spine was performed following the standard protocol without intravenous contrast. Multiplanar CT image reconstructions of the cervical spine were also generated.  COMPARISON:  11/24/2013   FINDINGS: CT HEAD FINDINGS  The bony calvarium is intact. No gross soft tissue abnormality is noted. Atrophic changes are seen similar to that noted on the prior exam. Mild chronic ischemic changes are noted as well. A cavum septum pellucidum is.7 no findings to suggest acute hemorrhage, acute infarction or space-occupying mass lesion are noted.  CT CERVICAL SPINE FINDINGS  Seven cervical segments are well visualized. Vertebral body height is well maintained. Osteophytic changes are noted from C3-C7. No acute fracture or acute facet abnormality is noted. An endotracheal tube is noted in place. Some fluid is noted in the posterior oropharynx which may be related to the tube placement.  IMPRESSION: CT of the head: Chronic atrophic and ischemic changes without acute abnormality.  CT of the cervical spine: Multilevel degenerative change without acute abnormality.   Electronically Signed   By: Inez Catalina M.D.   On: 04/18/2014 14:31   Ct Chest Wo Contrast  04/23/2014   CLINICAL DATA:  Short of breath. Lung infiltrates. Initial encounter.  EXAM: CT CHEST WITHOUT CONTRAST  TECHNIQUE: Multidetector CT imaging of the chest was performed following the standard protocol without IV contrast.  COMPARISON:  Chest radiograph 04/22/2014.  FINDINGS: Musculoskeletal: No aggressive osseous lesions. Thoracic vertebral body height preserved. Faint sclerotic lesion is present in the T5 vertebra, likely representing hemangioma or bone island. Old LEFT-sided rib fractures.  Lungs: Atelectasis is present associated with pleural effusions. Collapse/ consolidation in the posterior LEFT lower lobe. Scattered areas of atelectasis. No convincing evidence of pneumonia.  Central airways: Patent.  Vasculature: Atherosclerosis. No acute abnormality accounting for noncontrast technique. Coronary artery atherosclerosis is present. If office based assessment of coronary risk factors has not been performed, it is  now recommended.  Effusions: Small  bilateral dependently layering pleural effusions.  Lymphadenopathy: No axillary adenopathy. No mediastinal adenopathy. Hilar adenopathy poorly evaluated in the absence of contrast.  Esophagus: Normal.  Upper abdomen: Irregular contour or of the LEFT kidney which may be due to respiratory motion or renal scarring. This appears similar to the prior pulmonary embolism study. Renal mass is difficult to exclude. Consider follow-up ultrasound.  Other: None.  IMPRESSION: 1. Small bilateral pleural effusions with associated atelectasis. 2. Atherosclerosis and coronary artery disease.   Electronically Signed   By: Dereck Ligas M.D.   On: 04/23/2014 09:50   Ct Angio Chest Pe W/cm &/or Wo Cm  04/18/2014   CLINICAL DATA:  Difficulty breathing and altered mental status  EXAM: CT ANGIOGRAPHY CHEST WITH CONTRAST  TECHNIQUE: Multidetector CT imaging of the chest was performed using the standard protocol during bolus administration of intravenous contrast. Multiplanar CT image reconstructions and MIPs were obtained to evaluate the vascular anatomy.  CONTRAST:  63mL OMNIPAQUE IOHEXOL 350 MG/ML SOLN  COMPARISON:  Chest radiograph April 18, 2014  FINDINGS: There is no demonstrable pulmonary embolus. There is no thoracic aortic aneurysm or dissection.  Endotracheal tube tip is above carina.  No pneumothorax.  There is underlying central emphysematous change. There is airspace consolidation in the superior segment of the left lower lobe. There is also airspace consolidation in portions the lateral and posterior segments of the left lower lobe. There are small pleural effusions bilaterally with mild right base atelectatic change.  There is no appreciable thoracic adenopathy. Pericardium is not thickened.  In the visualized upper abdomen, there is atherosclerotic change in the aorta. There are several old healed posterior left rib fractures. No blastic or lytic bone lesions are identified.  Review of the MIP images confirms the above  findings.  IMPRESSION: No demonstrable pulmonary embolus.  Areas of airspace consolidation in the left lower lobe. Given the clinical history, aspiration must be of concern.  Small pleural effusions bilaterally.  Mild right base atelectasis.  No adenopathy. Mild underlying emphysematous change. Old healed rib fractures on the left. Patient intubated with endotracheal tube tip above the carina.   Electronically Signed   By: Lowella Grip III M.D.   On: 04/18/2014 14:32   Ct Cervical Spine Wo Contrast  04/18/2014   CLINICAL DATA:  Found on floor, decreasing level of consciousness  EXAM: CT HEAD WITHOUT CONTRAST  CT CERVICAL SPINE WITHOUT CONTRAST  TECHNIQUE: Multidetector CT imaging of the head and cervical spine was performed following the standard protocol without intravenous contrast. Multiplanar CT image reconstructions of the cervical spine were also generated.  COMPARISON:  11/24/2013  FINDINGS: CT HEAD FINDINGS  The bony calvarium is intact. No gross soft tissue abnormality is noted. Atrophic changes are seen similar to that noted on the prior exam. Mild chronic ischemic changes are noted as well. A cavum septum pellucidum is.7 no findings to suggest acute hemorrhage, acute infarction or space-occupying mass lesion are noted.  CT CERVICAL SPINE FINDINGS  Seven cervical segments are well visualized. Vertebral body height is well maintained. Osteophytic changes are noted from C3-C7. No acute fracture or acute facet abnormality is noted. An endotracheal tube is noted in place. Some fluid is noted in the posterior oropharynx which may be related to the tube placement.  IMPRESSION: CT of the head: Chronic atrophic and ischemic changes without acute abnormality.  CT of the cervical spine: Multilevel degenerative change without acute abnormality.   Electronically Signed   By:  Inez Catalina M.D.   On: 04/18/2014 14:31   Portable Chest Xray  04/19/2014   CLINICAL DATA:  Respiratory failure.  EXAM: PORTABLE CHEST  - 1 VIEW  COMPARISON:  CT 04/18/2014.  Chest x-ray 04/18/2014 .  FINDINGS: Endotracheal tube and NG tube in good anatomic position. Mediastinum and hilar structures are normal. Left lower lobe atelectasis and/or infiltrate. Small left pleural effusion cannot be excluded. Stable mild cardiomegaly. Pulmonary vascularity normal. No acute osseous abnormality .  IMPRESSION: 1. Lines and tubes in good anatomic position. 2. Left lower lobe atelectasis and/or infiltrate again noted. Small left pleural effusion cannot be excluded.   Electronically Signed   By: Marcello Moores  Register   On: 04/19/2014 07:14   Dg Chest Portable 1 View  04/18/2014   CLINICAL DATA:  Unresponsive, status post endotracheal intubation  EXAM: PORTABLE CHEST - 1 VIEW  COMPARISON:  04/01/2014  FINDINGS: Cardiac shadow is stable. An endotracheal tube is noted at the level of the carina and should be withdrawn 2 cm. The lungs are clear. No acute bony abnormality is noted.  IMPRESSION: No acute abnormality is noted. The endotracheal tube is at the level of the carina and should be withdrawn 2 cm.  These results were called by telephone at the time of interpretation on 04/18/2014 at 12:39 pm to Dr. Shirlyn Goltz , who verbally acknowledged these results.   Electronically Signed   By: Inez Catalina M.D.   On: 04/18/2014 12:40   Dg Abd Portable 1v  04/18/2014   CLINICAL DATA:  Feeding tube placement  EXAM: PORTABLE ABDOMEN - 1 VIEW  COMPARISON:  None.  FINDINGS: There is a nasogastric tube with tip overlapping the proximal stomach, with side port near the GE junction.  Bowel gas pattern is nonobstructive.  Retrocardiac atelectasis, known from chest CT earlier the same day.  IMPRESSION: Nasogastric tube tip at the proximal stomach, with side port near the GE junction.   Electronically Signed   By: Monte Fantasia M.D.   On: 04/18/2014 23:05    Microbiology: Recent Results (from the past 240 hour(s))  Culture, blood (routine x 2)     Status: None   Collection  Time: 04/18/14  3:42 PM  Result Value Ref Range Status   Specimen Description BLOOD LEFT FOREARM  Final   Special Requests BOTTLES DRAWN AEROBIC AND ANAEROBIC 3CC  Final   Culture   Final    NO GROWTH 5 DAYS Performed at [facility]    Report Status 04/24/2014 FINAL  Final  Culture, blood (routine x 2)     Status: None   Collection Time: 04/18/14  3:59 PM  Result Value Ref Range Status   Specimen Description BLOOD HAND LEFT  Final   Special Requests BOTTLES DRAWN AEROBIC ONLY 2CC  Final   Culture   Final    NO GROWTH 5 DAYS Performed at [facility]    Report Status 04/24/2014 FINAL  Final  MRSA PCR Screening     Status: Abnormal   Collection Time: 04/18/14  6:38 PM  Result Value Ref Range Status   MRSA by PCR POSITIVE (A) NEGATIVE Final    Comment:        The GeneXpert MRSA Assay (FDA approved for NASAL specimens only), is one component of a comprehensive MRSA colonization surveillance program. It is not intended to diagnose MRSA infection nor to guide or monitor treatment for MRSA infections. RESULT CALLED TO, READ BACK BY AND VERIFIED WITH: Bethann Humble RN  2020 04/18/14 A BROWNING      Labs: Basic Metabolic Panel:  Recent Labs Lab 04/19/14 0300 04/19/14 0945 04/19/14 1753 04/20/14 0449 04/20/14 1655 04/21/14 0243 04/23/14 0400 04/24/14 0530  NA  --  140  --  140  --  141 144 142  K  --  3.8  --  3.8  --  3.7 3.3* 3.5  CL  --  113*  --  107  --  102 109 110  CO2  --  23  --  27  --  30 24 25   GLUCOSE  --  110*  --  107*  --  122* 97 91  BUN  --  7  --  <5*  --  <5* 5* 10  CREATININE  --  0.61  --  0.46*  --  0.48* 0.57 0.66  CALCIUM  --  8.5  --  8.5  --  8.7 9.4 9.3  MG 1.8  --  2.0 1.7 2.3 1.8  --  1.7  PHOS 2.1*  --  4.9* 4.4 4.4 4.2  --   --    Liver Function Tests:  Recent Labs Lab 04/18/14 1148 04/20/14 0449 04/23/14 0400 04/24/14 0530  AST 26 75* 33 23  ALT 17 39* 40* 33  ALKPHOS 62 47 56 54  BILITOT 0.4 0.5 0.5 0.5  PROT  7.0 5.2* 6.0 6.2  ALBUMIN 3.6 2.5* 2.7* 2.8*   No results for input(s): LIPASE, AMYLASE in the last 168 hours. No results for input(s): AMMONIA in the last 168 hours. CBC:  Recent Labs Lab 04/18/14 1148 04/18/14 1151 04/19/14 0945 04/20/14 0449 04/23/14 0400 04/24/14 0530  WBC 12.8*  --  8.8 8.2 7.0 7.3  NEUTROABS 9.1*  --   --  4.9  --   --   HGB 15.0 17.7* 11.3* 12.3 12.4 13.1  HCT 47.4* 52.0* 34.9* 38.6 37.9 39.5  MCV 96.9  --  94.3 94.4 91.5 91.2  PLT 283  --  193 179 278 304   Cardiac Enzymes:  Recent Labs Lab 04/18/14 1148 04/18/14 2016 04/19/14 0300 04/19/14 0945  CKTOTAL 611* 9381* 3741* 3912*  TROPONINI <0.03  --   --   --    BNP: BNP (last 3 results)  Recent Labs  04/01/14 2018 04/18/14 1148  BNP 37.7 146.4*    ProBNP (last 3 results) No results for input(s): PROBNP in the last 8760 hours.  CBG:  Recent Labs Lab 04/23/14 0734 04/23/14 1315 04/23/14 1738 04/23/14 2150 04/24/14 0739  GLUCAP 91 87 82 106* 73       Signed:  Adil Tugwell  [facility] 04/24/2014, 9:48 AM

## 2014-04-24 NOTE — Clinical Social Work Note (Addendum)
Spoke to patient and her daughter to discuss bed offers.  Bed offers were presented to her, the daughter is deciding which SNF she would like patient to go and will inform Education officer, museum.  Awaiting call back from patient's daughter.  Patient's daughter returned call and she agreed to have patient go to [facility], informed patient and SNF.  Jones Broom. [place], MSW, [place] 04/24/2014 12:42 PM

## 2014-04-25 ENCOUNTER — Non-Acute Institutional Stay (SKILLED_NURSING_FACILITY): Payer: Medicare Other | Admitting: Internal Medicine

## 2014-04-25 DIAGNOSIS — I4891 Unspecified atrial fibrillation: Secondary | ICD-10-CM | POA: Diagnosis not present

## 2014-04-25 DIAGNOSIS — J690 Pneumonitis due to inhalation of food and vomit: Secondary | ICD-10-CM | POA: Diagnosis not present

## 2014-04-25 DIAGNOSIS — E119 Type 2 diabetes mellitus without complications: Secondary | ICD-10-CM

## 2014-04-25 DIAGNOSIS — E038 Other specified hypothyroidism: Secondary | ICD-10-CM

## 2014-04-25 DIAGNOSIS — R404 Transient alteration of awareness: Secondary | ICD-10-CM

## 2014-04-25 DIAGNOSIS — Z720 Tobacco use: Secondary | ICD-10-CM

## 2014-04-25 DIAGNOSIS — I10 Essential (primary) hypertension: Secondary | ICD-10-CM | POA: Diagnosis not present

## 2014-04-25 DIAGNOSIS — F319 Bipolar disorder, unspecified: Secondary | ICD-10-CM | POA: Diagnosis not present

## 2014-04-25 DIAGNOSIS — E034 Atrophy of thyroid (acquired): Secondary | ICD-10-CM

## 2014-04-25 DIAGNOSIS — R4189 Other symptoms and signs involving cognitive functions and awareness: Secondary | ICD-10-CM

## 2014-04-25 NOTE — Progress Notes (Signed)
MRN: [id number] Name: Kelsey Hanna  Sex: female Age: 64 y.o. DOB: 14-Feb-1951  [place] #: [facility] Facility/Room:107 Level Of Care: SNF Provider: Inocencio Homes D Emergency Contacts: Extended Emergency Contact Information Primary Emergency Contact: Felt,Michelle Address: [address], [place] [address] [place] Phone: [phone] Relation: Daughter  Code Status: FULL  Allergies: Lithium  Chief Complaint  Patient presents with  . New Admit To SNF    HPI: Patient is 64 y.o. female with h/o bipolar who was admitted to [facility] for unresponsive hypotension felt 2/2 Afib with RVR and drug OD (narcotics and benzodiazapines) admitted to SNF for OT/PT for generalized weakness.  Past Medical History  Diagnosis Date  . Bipolar 1 disorder   . Hypothyroid   . Dyslipidemia   . Retinal detachment   . Sleep apnea   . Diabetes mellitus     dx within last yr....just takes pills  . Cancer     cancer of uterus...no chemo or radiation    Past Surgical History  Procedure Laterality Date  . Abdominal hysterectomy    . Total abdominal hysterectomy w/ bilateral salpingoophorectomy    . Tonsillectomy    . Lumbar laminectomy/decompression microdiscectomy  07/17/2011    Procedure: LUMBAR LAMINECTOMY/DECOMPRESSION MICRODISCECTOMY;  Surgeon: Sinclair Ship, MD;  Location: [place];  Service: Orthopedics;  Laterality: Left;  Left sided lumbar 4-5 microdisectomy      Medication List       This list is accurate as of: 04/25/14 11:59 PM.  Always use your most recent med list.               aspirin 325 MG EC tablet  Take 1 tablet (325 mg total) by mouth daily.     atorvastatin 40 MG tablet  Commonly known as:  LIPITOR  Take 40 mg by mouth every evening.     Chlorhexidine Gluconate Cloth 2 % Pads  Apply 6 each topically daily at 6 (six) AM.     colchicine 0.6 MG tablet  Take 0.6 mg by mouth daily.     cyclobenzaprine 10 MG tablet  Commonly  known as:  FLEXERIL  Take 1 tablet (10 mg total) by mouth 3 (three) times daily as needed for muscle spasms.     divalproex 500 MG DR tablet  Commonly known as:  DEPAKOTE  Take 500-1,000 mg by mouth 2 (two) times daily. Take 1 tablet in the morning and 2 tablets in the evening     DULoxetine 30 MG capsule  Commonly known as:  CYMBALTA  Take 30 mg by mouth daily.     furosemide 80 MG tablet  Commonly known as:  LASIX  Take 80 mg by mouth daily.     gabapentin 300 MG capsule  Commonly known as:  NEURONTIN  Take 300 mg by mouth 3 (three) times daily.     levothyroxine 175 MCG tablet  Commonly known as:  SYNTHROID, LEVOTHROID  Take 175 mcg by mouth daily before breakfast.     lisinopril 5 MG tablet  Commonly known as:  PRINIVIL,ZESTRIL  Take 1 tablet (5 mg total) by mouth daily.     metFORMIN 500 MG tablet  Commonly known as:  GLUCOPHAGE  Take 500 mg by mouth 2 (two) times daily with a meal.     metoprolol 50 MG tablet  Commonly known as:  LOPRESSOR  Take 1 tablet (50 mg total) by mouth 2 (two) times  daily with a meal.     mupirocin ointment 2 %  Commonly known as:  BACTROBAN  Place 1 application into the nose 2 (two) times daily.     potassium chloride 10 MEQ tablet  Commonly known as:  K-DUR  Take 2 tablets (20 mEq total) by mouth daily.     PRESCRIPTION MEDICATION  Take 1 tablet by mouth at bedtime as needed (for sleep).     QUEtiapine 100 MG tablet  Commonly known as:  SEROQUEL  Take 1 tablet (100 mg total) by mouth 2 (two) times daily.     tapentadol 50 MG Tabs tablet  Commonly known as:  NUCYNTA  Take 50 mg by mouth every 6 (six) hours as needed.     VOLTAREN 1 % Gel  Generic drug:  diclofenac sodium  Apply 1 application topically daily.        No orders of the defined types were placed in this encounter.     There is no immunization history on file for this patient.  History  Substance Use Topics  . Smoking status: Former Smoker -- 2.00  packs/day for 35 years    Types: Cigarettes    Quit date: 07/09/1999  . Smokeless tobacco: Not on file  . Alcohol Use: No     Comment: socially    Family history is noncontributory    Review of Systems  - unable to get meaningful from pt; nursing voice no concerns other than pt is confused    Filed Vitals:   04/25/14 2212  BP: 156/96  Pulse: 82  Temp: 97.9 F (36.6 C)  Resp: 20    Physical Exam  GENERAL APPEARANCE: Alert, conversant,  No acute distress.  SKIN: No diaphoresis rash HEAD: Normocephalic, atraumatic  EYES: Conjunctiva/lids clear. Pupils round, reactive. EOMs intact.  EARS: External exam WNL, canals clear. Hearing grossly normal.  NOSE: No deformity or discharge.  MOUTH/THROAT: Lips w/o lesions  RESPIRATORY: Breathing is even, unlabored. Lung sounds are clear   CARDIOVASCULAR: Heart RRR no murmurs, rubs or gallops. 1+ peripheral edema.   GASTROINTESTINAL: Abdomen is soft, non-tender, not distended w/ normal bowel sounds. GENITOURINARY: Bladder non tender, not distended  MUSCULOSKELETAL: No abnormal joints or musculature NEUROLOGIC:  Cranial nerves 2-12 grossly intact. Moves all extremities  PSYCHIATRIC: oriented to self, no behavioral issues  Patient Active Problem List   Diagnosis Date Noted  . PNA (pneumonia) 04/27/2014  . Type 2 diabetes mellitus 04/27/2014  . Essential hypertension 04/27/2014  . Hypothyroidism 04/27/2014  . Tobacco abuse 04/27/2014  . Bipolar I disorder, most recent episode (or current) unspecified 04/20/2014  . Acute encephalopathy 04/18/2014  . Unresponsiveness   . Acute respiratory failure   . Encounter for feeding tube placement   . Rapid atrial fibrillation   . Shortness of breath     CBC    Component Value Date/Time   WBC 7.3 04/24/2014 0530   RBC 4.33 04/24/2014 0530   HGB 13.1 04/24/2014 0530   HCT 39.5 04/24/2014 0530   PLT 304 04/24/2014 0530   MCV 91.2 04/24/2014 0530   LYMPHSABS 2.1 04/20/2014 0449   MONOABS  1.0 04/20/2014 0449   EOSABS 0.1 04/20/2014 0449   BASOSABS 0.0 04/20/2014 0449    CMP     Component Value Date/Time   NA 142 04/24/2014 0530   K 3.5 04/24/2014 0530   CL 110 04/24/2014 0530   CO2 25 04/24/2014 0530   GLUCOSE 91 04/24/2014 0530   BUN 10 04/24/2014 0530  CREATININE 0.66 04/24/2014 0530   CALCIUM 9.3 04/24/2014 0530   PROT 6.2 04/24/2014 0530   ALBUMIN 2.8* 04/24/2014 0530   AST 23 04/24/2014 0530   ALT 33 04/24/2014 0530   ALKPHOS 54 04/24/2014 0530   BILITOT 0.5 04/24/2014 0530   GFRNONAA >90 04/24/2014 0530   GFRAA >90 04/24/2014 0530    Assessment and Plan  Rapid atrial fibrillation patient was found on floor for unclear duration on 3/1, intubated by EMS, send to ER found to be in rapid afib vs SVT, converted to sinus rhythm after cardizem. however has persistent hypotension   Unresponsiveness Unresponsiveness;: Resolved. likely secondary to drug overdose, as pt continued hypotension after chemical cardioversion; Pt was + for opiates and benzodiazepines; Patient was intubated on 3/1 and extubated on 3/2. EEG no seizure.     PNA (pneumonia) CTA on admission with LLL infiltrate and mild bilateral pleural effusion, patient received vancx1day? Per icu note, unasyn x 4days from admission for possible aspiration pneumonia, patient did have mild fever, mild leukocytosis and elevated lactic acid, which could be due to sepsis secondary to possible aspiration pneumonia.  --No fever, no leukocytosis at time of discharge. Repeat Ct chest 3/6 showed mild bilateral pleural effusion, iv lasix on 3/6/ 3/7, discharged on oral lasix. Cough resolved at time of discharge.   Type 2 diabetes mellitus On ssi in the hospital, metformin held in the hospital. Resumed at time of discharge.    Bipolar I disorder, most recent episode (or current) unspecified , confabulate , oriented only to person, very poor insight.stayed in ICU due to difficulty to control agitation.  Psychiatry consulted in the ICU recommended add seroquel/haldol; improved, not gone   Essential hypertension Not controlled on metoprolol, lisinopril and lasix;Plan -Increase lisinopril to 10 mg   Hypothyroidism Free T4 1.43 in 175 mcg synthroid; TSH very low at 0.03; source of tachycardia? Trial dec to 150 mg with TSH in 6 weeks   Tobacco abuse Nicotine patch     Hennie Duos, MD

## 2014-04-27 ENCOUNTER — Encounter: Payer: Self-pay | Admitting: Internal Medicine

## 2014-04-27 DIAGNOSIS — J189 Pneumonia, unspecified organism: Secondary | ICD-10-CM | POA: Insufficient documentation

## 2014-04-27 DIAGNOSIS — E039 Hypothyroidism, unspecified: Secondary | ICD-10-CM | POA: Insufficient documentation

## 2014-04-27 DIAGNOSIS — I10 Essential (primary) hypertension: Secondary | ICD-10-CM | POA: Insufficient documentation

## 2014-04-27 DIAGNOSIS — Z720 Tobacco use: Secondary | ICD-10-CM | POA: Insufficient documentation

## 2014-04-27 DIAGNOSIS — E119 Type 2 diabetes mellitus without complications: Secondary | ICD-10-CM | POA: Insufficient documentation

## 2014-04-27 NOTE — Assessment & Plan Note (Signed)
CTA on admission with LLL infiltrate and mild bilateral pleural effusion, patient received vancx1day? Per icu note, unasyn x 4days from admission for possible aspiration pneumonia, patient did have mild fever, mild leukocytosis and elevated lactic acid, which could be due to sepsis secondary to possible aspiration pneumonia.  --No fever, no leukocytosis at time of discharge. Repeat Ct chest 3/6 showed mild bilateral pleural effusion, iv lasix on 3/6/ 3/7, discharged on oral lasix. Cough resolved at time of discharge.

## 2014-04-27 NOTE — Assessment & Plan Note (Signed)
,   confabulate , oriented only to person, very poor insight.stayed in ICU due to difficulty to control agitation. Psychiatry consulted in the ICU recommended add seroquel/haldol; improved, not gone

## 2014-04-27 NOTE — Assessment & Plan Note (Signed)
On ssi in the hospital, metformin held in the hospital. Resumed at time of discharge.

## 2014-04-27 NOTE — Assessment & Plan Note (Signed)
patient was found on floor for unclear duration on 3/1, intubated by EMS, send to ER found to be in rapid afib vs SVT, converted to sinus rhythm after cardizem. however has persistent hypotension

## 2014-04-27 NOTE — Assessment & Plan Note (Signed)
-  Nicotine patch 

## 2014-04-27 NOTE — Assessment & Plan Note (Signed)
Free T4 1.43 in 175 mcg synthroid; TSH very low at 0.03; source of tachycardia? Trial dec to 150 mg with TSH in 6 weeks

## 2014-04-27 NOTE — Assessment & Plan Note (Addendum)
Not controlled on metoprolol, lisinopril and lasix;Plan -Increase lisinopril to 10 mg

## 2014-04-27 NOTE — Assessment & Plan Note (Addendum)
Unresponsiveness;: Resolved. likely secondary to drug overdose, as pt continued hypotension after chemical cardioversion; Pt was + for opiates and benzodiazepines; Patient was intubated on 3/1 and extubated on 3/2. EEG no seizure.

## 2014-04-28 ENCOUNTER — Telehealth: Payer: Self-pay | Admitting: Cardiology

## 2014-04-28 NOTE — Telephone Encounter (Signed)
Closed encounter °

## 2014-05-10 ENCOUNTER — Non-Acute Institutional Stay (SKILLED_NURSING_FACILITY): Payer: Medicare Other | Admitting: Internal Medicine

## 2014-05-10 ENCOUNTER — Encounter: Payer: Self-pay | Admitting: Internal Medicine

## 2014-05-10 DIAGNOSIS — I4891 Unspecified atrial fibrillation: Secondary | ICD-10-CM | POA: Diagnosis not present

## 2014-05-10 DIAGNOSIS — E034 Atrophy of thyroid (acquired): Secondary | ICD-10-CM | POA: Diagnosis not present

## 2014-05-10 DIAGNOSIS — E038 Other specified hypothyroidism: Secondary | ICD-10-CM | POA: Diagnosis not present

## 2014-05-10 DIAGNOSIS — Z720 Tobacco use: Secondary | ICD-10-CM

## 2014-05-10 DIAGNOSIS — Z4659 Encounter for fitting and adjustment of other gastrointestinal appliance and device: Secondary | ICD-10-CM

## 2014-05-10 DIAGNOSIS — I10 Essential (primary) hypertension: Secondary | ICD-10-CM

## 2014-05-10 DIAGNOSIS — F319 Bipolar disorder, unspecified: Secondary | ICD-10-CM | POA: Diagnosis not present

## 2014-05-10 DIAGNOSIS — R4189 Other symptoms and signs involving cognitive functions and awareness: Secondary | ICD-10-CM

## 2014-05-10 DIAGNOSIS — Z87898 Personal history of other specified conditions: Secondary | ICD-10-CM | POA: Diagnosis not present

## 2014-05-10 DIAGNOSIS — R404 Transient alteration of awareness: Secondary | ICD-10-CM

## 2014-05-10 DIAGNOSIS — E119 Type 2 diabetes mellitus without complications: Secondary | ICD-10-CM

## 2014-05-10 NOTE — Progress Notes (Signed)
MRN: [id number] Name: Kelsey Hanna  Sex: female Age: 64 y.o. DOB: 30-Jul-1950  [place] [facility]: [facility] Facility/Room:107 Level Of Care: SNF Provider: Inocencio Homes D Emergency Contacts: Extended Emergency Contact Information Primary Emergency Contact: Felt,Michelle Address: [address], [place] [address] [place] Phone: [phone] Relation: Daughter  Code Status: FULL  Allergies: Lithium  Chief Complaint  Patient presents with  . Discharge Note    HPI: Patient is 64 y.o. female who was admitted to SNF for generalized weakness after being hospitalized for Afib with RVR and drug OD (benzo and narcotics) who is now ready to be d/c to home.  Past Medical History  Diagnosis Date  . Bipolar 1 disorder   . Hypothyroid   . Dyslipidemia   . Retinal detachment   . Sleep apnea   . Diabetes mellitus     dx within last yr....just takes pills  . Cancer     cancer of uterus...no chemo or radiation    Past Surgical History  Procedure Laterality Date  . Abdominal hysterectomy    . Total abdominal hysterectomy w/ bilateral salpingoophorectomy    . Tonsillectomy    . Lumbar laminectomy/decompression microdiscectomy  07/17/2011    Procedure: LUMBAR LAMINECTOMY/DECOMPRESSION MICRODISCECTOMY;  Surgeon: Sinclair Ship, MD;  Location: [place];  Service: Orthopedics;  Laterality: Left;  Left sided lumbar 4-5 microdisectomy      Medication List       This list is accurate as of: 05/10/14  2:40 PM.  Always use your most recent med list.               aspirin 325 MG EC tablet  Take 1 tablet (325 mg total) by mouth daily.     atorvastatin 40 MG tablet  Commonly known as:  LIPITOR  Take 40 mg by mouth every evening.     colchicine 0.6 MG tablet  Take 0.6 mg by mouth daily.     cyclobenzaprine 10 MG tablet  Commonly known as:  FLEXERIL  Take 1 tablet (10 mg total) by mouth 3 (three) times daily as needed for muscle spasms.      divalproex 500 MG DR tablet  Commonly known as:  DEPAKOTE  Take 500-1,000 mg by mouth 2 (two) times daily. Take 1 tablet in the morning and 2 tablets in the evening     DULoxetine 30 MG capsule  Commonly known as:  CYMBALTA  Take 30 mg by mouth daily.     furosemide 80 MG tablet  Commonly known as:  LASIX  Take 80 mg by mouth daily.     gabapentin 300 MG capsule  Commonly known as:  NEURONTIN  Take 300 mg by mouth 3 (three) times daily.     levothyroxine 175 MCG tablet  Commonly known as:  SYNTHROID, LEVOTHROID  Take 175 mcg by mouth daily before breakfast.     lisinopril 5 MG tablet  Commonly known as:  PRINIVIL,ZESTRIL  Take 1 tablet (5 mg total) by mouth daily.     metFORMIN 500 MG tablet  Commonly known as:  GLUCOPHAGE  Take 500 mg by mouth 2 (two) times daily with a meal.     metoprolol 50 MG tablet  Commonly known as:  LOPRESSOR  Take 1 tablet (50 mg total) by mouth 2 (two) times daily with a meal.     mupirocin ointment 2 %  Commonly known as:  BACTROBAN  Place 1  application into the nose 2 (two) times daily.     potassium chloride 10 MEQ tablet  Commonly known as:  K-DUR  Take 2 tablets (20 mEq total) by mouth daily.     PRESCRIPTION MEDICATION  Take 1 tablet by mouth at bedtime as needed (for sleep).     QUEtiapine 100 MG tablet  Commonly known as:  SEROQUEL  Take 1 tablet (100 mg total) by mouth 2 (two) times daily.     tapentadol 50 MG Tabs tablet  Commonly known as:  NUCYNTA  Take 50 mg by mouth every 6 (six) hours as needed.     VOLTAREN 1 % Gel  Generic drug:  diclofenac sodium  Apply 1 application topically daily.        No orders of the defined types were placed in this encounter.     There is no immunization history on file for this patient.  History  Substance Use Topics  . Smoking status: Former Smoker -- 2.00 packs/day for 35 years    Types: Cigarettes    Quit date: 07/09/1999  . Smokeless tobacco: Not on file  . Alcohol Use:  No     Comment: socially    Filed Vitals:   05/10/14 1434  BP: 118/65  Pulse: 83  Temp: 96.8 F (36 C)  Resp: 18    Physical Exam  GENERAL APPEARANCE: Alert, conversant. No acute distress.  HEENT: Unremarkable. RESPIRATORY: Breathing is even, unlabored. Lung sounds are clear   CARDIOVASCULAR: Heart RRR no murmurs, rubs or gallops. No peripheral edema.  GASTROINTESTINAL: Abdomen is soft, non-tender, not distended w/ normal bowel sounds.  NEUROLOGIC: Cranial nerves 2-12 grossly intact  Patient Active Problem List   Diagnosis Date Noted  . PNA (pneumonia) 04/27/2014  . Type 2 diabetes mellitus 04/27/2014  . Essential hypertension 04/27/2014  . Hypothyroidism 04/27/2014  . Tobacco abuse 04/27/2014  . Bipolar I disorder, most recent episode (or current) unspecified 04/20/2014  . Acute encephalopathy 04/18/2014  . Unresponsiveness   . Acute respiratory failure   . Encounter for feeding tube placement   . Rapid atrial fibrillation   . Shortness of breath     CBC    Component Value Date/Time   WBC 7.3 04/24/2014 0530   RBC 4.33 04/24/2014 0530   HGB 13.1 04/24/2014 0530   HCT 39.5 04/24/2014 0530   PLT 304 04/24/2014 0530   MCV 91.2 04/24/2014 0530   LYMPHSABS 2.1 04/20/2014 0449   MONOABS 1.0 04/20/2014 0449   EOSABS 0.1 04/20/2014 0449   BASOSABS 0.0 04/20/2014 0449    CMP     Component Value Date/Time   NA 142 04/24/2014 0530   K 3.5 04/24/2014 0530   CL 110 04/24/2014 0530   CO2 25 04/24/2014 0530   GLUCOSE 91 04/24/2014 0530   BUN 10 04/24/2014 0530   CREATININE 0.66 04/24/2014 0530   CALCIUM 9.3 04/24/2014 0530   PROT 6.2 04/24/2014 0530   ALBUMIN 2.8* 04/24/2014 0530   AST 23 04/24/2014 0530   ALT 33 04/24/2014 0530   ALKPHOS 54 04/24/2014 0530   BILITOT 0.5 04/24/2014 0530   GFRNONAA >90 04/24/2014 0530   GFRAA >90 04/24/2014 0530    Assessment and Plan  Pt is ready to be d/c to home with HH/OT/PT/CNA/nursing. DME-rolling  walker.  Hennie Duos, MD

## 2014-05-17 NOTE — Progress Notes (Signed)
HPI: 64 year old female for follow-up of atrial fibrillation. Patient recently found down and on admission noted to be in atrial fibrillation. She converted to sinus rhythm with a calcium blocker. Echocardiogram March 2016 showed an ejection fraction of 45-50%. There was mild to moderate mitral regurgitation and mild to moderate elevation in pulmonary pressures. She was treated for SIRS. TSH was low but free T4 was normal. Anticoagulation was not administered because of recent fall and she was confused as well. Since discharge,   Current Outpatient Prescriptions  Medication Sig Dispense Refill  . aspirin EC 325 MG EC tablet Take 1 tablet (325 mg total) by mouth daily. 30 tablet 0  . atorvastatin (LIPITOR) 40 MG tablet Take 40 mg by mouth every evening.     . Chlorhexidine Gluconate Cloth 2 % PADS Apply 6 each topically daily at 6 (six) AM.    . colchicine 0.6 MG tablet Take 0.6 mg by mouth daily.    . cyclobenzaprine (FLEXERIL) 10 MG tablet Take 1 tablet (10 mg total) by mouth 3 (three) times daily as needed for muscle spasms. 30 tablet 0  . divalproex (DEPAKOTE) 500 MG DR tablet Take 500-1,000 mg by mouth 2 (two) times daily. Take 1 tablet in the morning and 2 tablets in the evening    . DULoxetine (CYMBALTA) 30 MG capsule Take 30 mg by mouth daily.    . furosemide (LASIX) 80 MG tablet Take 80 mg by mouth daily.    Marland Kitchen gabapentin (NEURONTIN) 300 MG capsule Take 300 mg by mouth 3 (three) times daily.    Marland Kitchen levothyroxine (SYNTHROID, LEVOTHROID) 175 MCG tablet Take 175 mcg by mouth daily before breakfast.    . lisinopril (PRINIVIL,ZESTRIL) 5 MG tablet Take 1 tablet (5 mg total) by mouth daily. (Patient taking differently: Take 10 mg by mouth daily. ) 30 tablet 0  . metFORMIN (GLUCOPHAGE) 500 MG tablet Take 500 mg by mouth 2 (two) times daily with a meal.    . metoprolol (LOPRESSOR) 50 MG tablet Take 1 tablet (50 mg total) by mouth 2 (two) times daily with a meal. 60 tablet 0  . mupirocin  ointment (BACTROBAN) 2 % Place 1 application into the nose 2 (two) times daily. 22 g 0  . potassium chloride (K-DUR) 10 MEQ tablet Take 2 tablets (20 mEq total) by mouth daily. 60 tablet 0  . PRESCRIPTION MEDICATION Take 1 tablet by mouth at bedtime as needed (for sleep).    . QUEtiapine (SEROQUEL) 100 MG tablet Take 1 tablet (100 mg total) by mouth 2 (two) times daily. 60 tablet 0  . tapentadol (NUCYNTA) 50 MG TABS tablet Take 50 mg by mouth every 6 (six) hours as needed.    . VOLTAREN 1 % GEL Apply 1 application topically daily.     No current facility-administered medications for this visit.     Past Medical History  Diagnosis Date  . Bipolar 1 disorder   . Hypothyroid   . Dyslipidemia   . Retinal detachment   . Sleep apnea   . Diabetes mellitus     dx within last yr....just takes pills  . Cancer     cancer of uterus...no chemo or radiation    Past Surgical History  Procedure Laterality Date  . Abdominal hysterectomy    . Total abdominal hysterectomy w/ bilateral salpingoophorectomy    . Tonsillectomy    . Lumbar laminectomy/decompression microdiscectomy  07/17/2011    Procedure: LUMBAR LAMINECTOMY/DECOMPRESSION MICRODISCECTOMY;  Surgeon: Sinclair Ship,  MD;  Location: [place];  Service: Orthopedics;  Laterality: Left;  Left sided lumbar 4-5 microdisectomy    History   Social History  . Marital Status: Divorced    Spouse Name: N/A  . Number of Children: N/A  . Years of Education: N/A   Occupational History  . Not on file.   Social History Main Topics  . Smoking status: Former Smoker -- 2.00 packs/day for 35 years    Types: Cigarettes    Quit date: 07/09/1999  . Smokeless tobacco: Not on file  . Alcohol Use: No     Comment: socially  . Drug Use: No  . Sexual Activity: Not on file   Other Topics Concern  . Not on file   Social History Narrative    ROS: no fevers or chills, productive cough, hemoptysis, dysphasia, odynophagia, melena, hematochezia,  dysuria, hematuria, rash, seizure activity, orthopnea, PND, pedal edema, claudication. Remaining systems are negative.  Physical Exam: Well-developed well-nourished in no acute distress.  Skin is warm and dry.  HEENT is normal.  Neck is supple.  Chest is clear to auscultation with normal expansion.  Cardiovascular exam is regular rate and rhythm.  Abdominal exam nontender or distended. No masses palpated. Extremities show no edema. neuro grossly intact  ECG     This encounter was created in error - please disregard.

## 2014-05-19 ENCOUNTER — Telehealth: Payer: Self-pay | Admitting: Cardiology

## 2014-05-19 NOTE — Telephone Encounter (Signed)
Patient needs to speak with you regarding her condition.

## 2014-05-19 NOTE — Telephone Encounter (Signed)
This encounter was created in error - please disregard.

## 2014-05-19 NOTE — Addendum Note (Signed)
Addended by: Cristopher Estimable on: 05/19/2014 01:40 PM   Modules accepted: Level of Service, SmartSet

## 2014-05-22 ENCOUNTER — Encounter: Payer: Self-pay | Admitting: Cardiology

## 2014-05-23 DIAGNOSIS — I480 Paroxysmal atrial fibrillation: Secondary | ICD-10-CM | POA: Insufficient documentation

## 2014-05-23 DIAGNOSIS — I10 Essential (primary) hypertension: Secondary | ICD-10-CM | POA: Insufficient documentation

## 2014-06-06 ENCOUNTER — Other Ambulatory Visit: Payer: Self-pay | Admitting: Internal Medicine

## 2014-06-20 ENCOUNTER — Ambulatory Visit: Payer: Self-pay | Admitting: Cardiology

## 2014-07-04 ENCOUNTER — Emergency Department (HOSPITAL_COMMUNITY): Payer: Medicare Other

## 2014-07-04 ENCOUNTER — Emergency Department (HOSPITAL_COMMUNITY)
Admission: EM | Admit: 2014-07-04 | Discharge: 2014-07-04 | Disposition: A | Discharge status: Home / Self Care | Payer: Medicare Other | Attending: Emergency Medicine | Admitting: Emergency Medicine

## 2014-07-04 ENCOUNTER — Encounter (HOSPITAL_COMMUNITY): Payer: Self-pay | Admitting: *Deleted

## 2014-07-04 DIAGNOSIS — Z792 Long term (current) use of antibiotics: Secondary | ICD-10-CM | POA: Diagnosis not present

## 2014-07-04 DIAGNOSIS — Z79899 Other long term (current) drug therapy: Secondary | ICD-10-CM | POA: Insufficient documentation

## 2014-07-04 DIAGNOSIS — H5442 Blindness, left eye, normal vision right eye: Secondary | ICD-10-CM | POA: Insufficient documentation

## 2014-07-04 DIAGNOSIS — E039 Hypothyroidism, unspecified: Secondary | ICD-10-CM | POA: Insufficient documentation

## 2014-07-04 DIAGNOSIS — Z87891 Personal history of nicotine dependence: Secondary | ICD-10-CM | POA: Insufficient documentation

## 2014-07-04 DIAGNOSIS — R42 Dizziness and giddiness: Secondary | ICD-10-CM | POA: Insufficient documentation

## 2014-07-04 DIAGNOSIS — Z7982 Long term (current) use of aspirin: Secondary | ICD-10-CM | POA: Diagnosis not present

## 2014-07-04 DIAGNOSIS — E785 Hyperlipidemia, unspecified: Secondary | ICD-10-CM | POA: Insufficient documentation

## 2014-07-04 DIAGNOSIS — R1110 Vomiting, unspecified: Secondary | ICD-10-CM | POA: Insufficient documentation

## 2014-07-04 DIAGNOSIS — E119 Type 2 diabetes mellitus without complications: Secondary | ICD-10-CM | POA: Insufficient documentation

## 2014-07-04 DIAGNOSIS — Z8542 Personal history of malignant neoplasm of other parts of uterus: Secondary | ICD-10-CM | POA: Insufficient documentation

## 2014-07-04 LAB — COMPREHENSIVE METABOLIC PANEL
ALT: 25 U/L (ref 14–54)
AST: 29 U/L (ref 15–41)
Albumin: 4.5 g/dL (ref 3.5–5.0)
Alkaline Phosphatase: 73 U/L (ref 38–126)
Anion gap: 13 (ref 5–15)
BUN: 16 mg/dL (ref 6–20)
CO2: 23 mmol/L (ref 22–32)
Calcium: 9.6 mg/dL (ref 8.9–10.3)
Chloride: 103 mmol/L (ref 101–111)
Creatinine, Ser: 0.76 mg/dL (ref 0.44–1.00)
Glucose, Bld: 114 mg/dL — ABNORMAL HIGH (ref 65–99)
Potassium: 4.8 mmol/L (ref 3.5–5.1)
SODIUM: 139 mmol/L (ref 135–145)
Total Bilirubin: 1.7 mg/dL — ABNORMAL HIGH (ref 0.3–1.2)
Total Protein: 7.3 g/dL (ref 6.5–8.1)

## 2014-07-04 LAB — CBC WITH DIFFERENTIAL/PLATELET
BASOS ABS: 0 10*3/uL (ref 0.0–0.1)
BASOS PCT: 0 % (ref 0–1)
Eosinophils Absolute: 0.1 10*3/uL (ref 0.0–0.7)
Eosinophils Relative: 1 % (ref 0–5)
HCT: 42.7 % (ref 36.0–46.0)
Hemoglobin: 14.1 g/dL (ref 12.0–15.0)
Lymphocytes Relative: 30 % (ref 12–46)
Lymphs Abs: 3.2 10*3/uL (ref 0.7–4.0)
MCH: 30.4 pg (ref 26.0–34.0)
MCHC: 33 g/dL (ref 30.0–36.0)
MCV: 92 fL (ref 78.0–100.0)
MONOS PCT: 6 % (ref 3–12)
Monocytes Absolute: 0.7 10*3/uL (ref 0.1–1.0)
Neutro Abs: 6.8 10*3/uL (ref 1.7–7.7)
Neutrophils Relative %: 63 % (ref 43–77)
Platelets: 304 10*3/uL (ref 150–400)
RBC: 4.64 MIL/uL (ref 3.87–5.11)
RDW: 14.2 % (ref 11.5–15.5)
WBC: 10.8 10*3/uL — ABNORMAL HIGH (ref 4.0–10.5)

## 2014-07-04 LAB — I-STAT TROPONIN, ED: TROPONIN I, POC: 0 ng/mL (ref 0.00–0.08)

## 2014-07-04 MED ORDER — LORAZEPAM 2 MG/ML IJ SOLN
1.0000 mg | Freq: Once | INTRAMUSCULAR | Status: AC
Start: 2014-07-04 — End: 2014-07-04
  Administered 2014-07-04: 1 mg via INTRAVENOUS
  Filled 2014-07-04: qty 1

## 2014-07-04 MED ORDER — MECLIZINE HCL 12.5 MG PO TABS: 12.5000 mg | ORAL_TABLET | Freq: Three times a day (TID) | ORAL | Status: DC | PRN

## 2014-07-04 MED ORDER — PROMETHAZINE HCL 25 MG/ML IJ SOLN
12.5000 mg | Freq: Once | INTRAMUSCULAR | Status: AC
  Administered 2014-07-04: 12.5 mg via INTRAVENOUS
  Filled 2014-07-04: qty 1

## 2014-07-04 MED ORDER — MECLIZINE HCL 25 MG PO TABS
25.0000 mg | ORAL_TABLET | Freq: Once | ORAL | Status: AC
  Administered 2014-07-04: 25 mg via ORAL
  Filled 2014-07-04: qty 1

## 2014-07-04 MED ORDER — SODIUM CHLORIDE 0.9 % IV BOLUS (SEPSIS)
1000.0000 mL | Freq: Once | INTRAVENOUS | Status: AC
  Administered 2014-07-04: 1000 mL via INTRAVENOUS

## 2014-07-04 NOTE — Discharge Instructions (Signed)
Take meclizine as needed for dizziness.  Follow up with a neurologist.   Return to ER if you have worse dizziness, vomiting, headaches.

## 2014-07-04 NOTE — ED Provider Notes (Signed)
CSN: [id number]     Arrival date & time 07/04/14  1506 History   First MD Initiated Contact with Patient 07/04/14 1536     Chief Complaint  Patient presents with  . Dizziness     (Consider location/radiation/quality/duration/timing/severity/associated sxs/prior Treatment) The history is provided by the patient.  Kelsey Hanna is a 64 y.o. female hx of bipolar, diabetes, uterine cancer here presenting with dizziness, headache, hypersensitivity. Patient states that she has frequent episodes of vertigo. She has not been taking meclizine recently. She also noticed that she has some hypersensitivity where her senses are heightened. Denies any weakness. States that she's been in the ED several times for the same complaint but nobody ever finds out why she has vertigo. Denies any headaches but does have some vomiting. She states that she feels like she is having a migraine without the headache. f note, she was admitted 2 months ago for drug overdose and was intubated and went to the ICU. Denies any drug overdose this time. In particular, she denies taking more of her depakote.   Past Medical History  Diagnosis Date  . Bipolar 1 disorder   . Hypothyroid   . Dyslipidemia   . Retinal detachment   . Sleep apnea   . Diabetes mellitus     dx within last yr....just takes pills  . Cancer     cancer of uterus...no chemo or radiation   Past Surgical History  Procedure Laterality Date  . Abdominal hysterectomy    . Total abdominal hysterectomy w/ bilateral salpingoophorectomy    . Tonsillectomy    . Lumbar laminectomy/decompression microdiscectomy  07/17/2011    Procedure: LUMBAR LAMINECTOMY/DECOMPRESSION MICRODISCECTOMY;  Surgeon: Sinclair Ship, MD;  Location: [place];  Service: Orthopedics;  Laterality: Left;  Left sided lumbar 4-5 microdisectomy   No family history on file. History  Substance Use Topics  . Smoking status: Former Smoker -- 2.00 packs/day for 35 years    Types:  Cigarettes    Quit date: 07/09/1999  . Smokeless tobacco: Not on file  . Alcohol Use: No     Comment: socially   OB History    No data available     Review of Systems  Gastrointestinal: Positive for vomiting.  All other systems reviewed and are negative.     Allergies  Lithium  Home Medications   Prior to Admission medications   Medication Sig Start Date End Date Taking? Authorizing Provider  aspirin EC 325 MG EC tablet Take 1 tablet (325 mg total) by mouth daily. 04/24/14  Yes Florencia Reasons, MD  atorvastatin (LIPITOR) 40 MG tablet Take 40 mg by mouth every evening.    Yes Historical Provider, MD  colchicine 0.6 MG tablet Take 0.6 mg by mouth daily.   Yes Historical Provider, MD  cyclobenzaprine (FLEXERIL) 10 MG tablet Take 1 tablet (10 mg total) by mouth 3 (three) times daily as needed for muscle spasms. 04/24/14  Yes Florencia Reasons, MD  divalproex (DEPAKOTE) 500 MG DR tablet Take 500 mg by mouth every morning. Take 1 tablet in the morning and 2 tablets in the evening   Yes Historical Provider, MD  DULoxetine (CYMBALTA) 30 MG capsule Take 30 mg by mouth daily.   Yes Historical Provider, MD  furosemide (LASIX) 80 MG tablet Take 80 mg by mouth daily.   Yes Historical Provider, MD  gabapentin (NEURONTIN) 300 MG capsule Take 300 mg by mouth 3 (three) times daily.   Yes Historical Provider, MD  levothyroxine (SYNTHROID, LEVOTHROID)  137 MCG tablet Take 137 mcg by mouth daily before breakfast.   Yes Historical Provider, MD  lisinopril (PRINIVIL,ZESTRIL) 10 MG tablet Take 10 mg by mouth daily. 05/11/14  Yes Historical Provider, MD  metFORMIN (GLUCOPHAGE) 500 MG tablet Take 500 mg by mouth 2 (two) times daily with a meal.   Yes Historical Provider, MD  metoprolol (LOPRESSOR) 50 MG tablet Take 1 tablet (50 mg total) by mouth 2 (two) times daily with a meal. 04/24/14  Yes Florencia Reasons, MD  morphine (MSIR) 15 MG tablet Take 1 tablet by mouth 4 (four) times daily as needed. 07/03/14  Yes Historical Provider, MD   potassium chloride (K-DUR) 10 MEQ tablet Take 2 tablets (20 mEq total) by mouth daily. 04/24/14  Yes Florencia Reasons, MD  QUEtiapine (SEROQUEL) 100 MG tablet Take 1 tablet (100 mg total) by mouth 2 (two) times daily. 04/24/14  Yes Florencia Reasons, MD  Chlorhexidine Gluconate Cloth 2 % PADS Apply 6 each topically daily at 6 (six) AM.    Historical Provider, MD  levothyroxine (SYNTHROID, LEVOTHROID) 175 MCG tablet Take 175 mcg by mouth daily before breakfast.    Historical Provider, MD  lisinopril (PRINIVIL,ZESTRIL) 5 MG tablet Take 1 tablet (5 mg total) by mouth daily. Patient not taking: Reported on 07/04/2014 04/24/14   Florencia Reasons, MD  mupirocin ointment (BACTROBAN) 2 % Place 1 application into the nose 2 (two) times daily. Patient not taking: Reported on 07/04/2014 04/24/14   Florencia Reasons, MD  PRESCRIPTION MEDICATION Take 1 tablet by mouth at bedtime as needed (for sleep).    Historical Provider, MD  tapentadol (NUCYNTA) 50 MG TABS tablet Take 50 mg by mouth every 6 (six) hours as needed.    Historical Provider, MD  VOLTAREN 1 % GEL Apply 1 application topically daily. 06/29/13   Historical Provider, MD   BP 148/85 mmHg  Pulse 67  Temp(Src) 98.5 F (36.9 C) (Oral)  Resp 14  SpO2 98% Physical Exam  Constitutional: She is oriented to person, place, and time.  Anxious   HENT:  Head: Normocephalic.  Mouth/Throat: Oropharynx is clear and moist.  Eyes:  L eye blind (chronic). No obvious nystagmus   Neck: Normal range of motion. Neck supple.  Cardiovascular: Normal rate, regular rhythm and normal heart sounds.   Pulmonary/Chest: Effort normal and breath sounds normal. No respiratory distress. She has no wheezes. She exhibits no tenderness.  Abdominal: Soft. Bowel sounds are normal. She exhibits no distension. There is no tenderness. There is no rebound.  Musculoskeletal: Normal range of motion. She exhibits no edema or tenderness.  Neurological: She is alert and oriented to person, place, and time. No cranial nerve  deficit. Coordination normal.  CN 2-12 intact. Nl strength and sensation throughout.   Skin: Skin is warm and dry.  Psychiatric: She has a normal mood and affect. Her behavior is normal. Judgment and thought content normal.  Nursing note and vitals reviewed.   ED Course  Procedures (including critical care time) Labs Review Labs Reviewed  CBC WITH DIFFERENTIAL/PLATELET - Abnormal; Notable for the following:    WBC 10.8 (*)    All other components within normal limits  COMPREHENSIVE METABOLIC PANEL - Abnormal; Notable for the following:    Glucose, Bld 114 (*)    Total Bilirubin 1.7 (*)    All other components within normal limits  I-STAT TROPOININ, ED    Imaging Review Ct Head Wo Contrast  07/04/2014   CLINICAL DATA:  worsening dizziness and nausea for the  past 2 weeks. Pt denies headache, emesis. Pt states she has a history of vertigo.  EXAM: CT HEAD WITHOUT CONTRAST  TECHNIQUE: Contiguous axial images were obtained from the base of the skull through the vertex without intravenous contrast.  COMPARISON:  04/18/2014  FINDINGS: Atherosclerotic and physiologic intracranial calcifications. Cavum septum pellucidum. Diffuse parenchymal atrophy. Patchy areas of hypoattenuation in deep and periventricular white matter bilaterally. Negative for acute intracranial hemorrhage, mass lesion, acute infarction, midline shift, or mass-effect. Acute infarct may be inapparent on noncontrast CT. Ventricles and sulci symmetric. Bone windows demonstrate no focal lesion.  IMPRESSION: 1. Negative for bleed or other acute intracranial process. 2. Atrophy and nonspecific white matter changes.   Electronically Signed   By: Lucrezia Europe M.D.   On: 07/04/2014 17:38     EKG Interpretation   Date/Time:  Tuesday Jul 04 2014 15:52:50 EDT Ventricular Rate:  63 PR Interval:  149 QRS Duration: 93 QT Interval:  450 QTC Calculation: 461 R Axis:   1 Text Interpretation:  Sinus rhythm Probable left atrial enlargement  RSR'  in V1 or V2, probably normal variant No significant change since last  tracing Confirmed by Marjarie Irion  MD, Umar Patmon ([id number]) on 07/04/2014 3:56:53 PM      MDM   Final diagnoses:  None   ANUREET BRUINGTON is a 64 y.o. female here with hypertensitivity, vomiting. Can be migraine. Denies overdose. Will check basic labs. Will get CT head. I think likely peripheral vertigo exacerbated by anxiety. Will give migraine cocktail, ativan.   5:49 PM Labs and CT head unremarkable. Vertigo improved with migraine cocktail, ativan, and meclizine. Will dc home with prn meclizine, neuro referral.      Wandra Arthurs, MD 07/04/14 1750

## 2014-07-04 NOTE — Progress Notes (Signed)
CSW was notified by Nurse CM that the pt has issues with transportation.  CSW met with pt at bedside, who confirms that she does not have transportation home. CSW provided the pt with a taxi voucher and called [employer]. CSW made nurse aware.  Willette Brace [phone] ED CSW 07/04/2014 6:54 PM

## 2014-07-04 NOTE — ED Notes (Signed)
RN at bedside attempting to start IV, will draw blood with start.

## 2014-07-04 NOTE — ED Notes (Signed)
Pt complains of worsening dizziness and nausea for the past 2 weeks. Pt denies headache, emesis. Pt states she has a history of vertigo.

## 2014-07-27 DIAGNOSIS — R42 Dizziness and giddiness: Secondary | ICD-10-CM | POA: Insufficient documentation

## 2014-07-28 ENCOUNTER — Telehealth: Payer: Self-pay | Admitting: Cardiology

## 2014-07-28 NOTE — Telephone Encounter (Signed)
Received records from [facility] for appointment with Dr Stanford Breed on 09/3014.  Records given to [facility] (medical records) for Dr Jacalyn Lefevre schedule on 09/3014. lp

## 2014-08-03 ENCOUNTER — Other Ambulatory Visit: Payer: Self-pay | Admitting: Internal Medicine

## 2014-08-09 ENCOUNTER — Telehealth: Payer: Self-pay | Admitting: *Deleted

## 2014-08-09 ENCOUNTER — Ambulatory Visit: Payer: Medicare Other | Admitting: Neurology

## 2014-08-09 NOTE — Telephone Encounter (Signed)
No showed new patient appt 

## 2014-08-10 ENCOUNTER — Encounter: Payer: Self-pay | Admitting: Neurology

## 2014-08-16 ENCOUNTER — Ambulatory Visit: Payer: Medicare Other | Admitting: Neurology

## 2014-08-23 ENCOUNTER — Other Ambulatory Visit: Payer: Self-pay | Admitting: Internal Medicine

## 2014-08-23 ENCOUNTER — Encounter (HOSPITAL_COMMUNITY): Payer: Self-pay | Admitting: Emergency Medicine

## 2014-08-23 ENCOUNTER — Emergency Department (HOSPITAL_COMMUNITY)
Admission: EM | Admit: 2014-08-23 | Discharge: 2014-08-23 | Disposition: A | Discharge status: Home / Self Care | Payer: Medicare Other | Attending: Emergency Medicine | Admitting: Emergency Medicine

## 2014-08-23 DIAGNOSIS — Z79899 Other long term (current) drug therapy: Secondary | ICD-10-CM | POA: Insufficient documentation

## 2014-08-23 DIAGNOSIS — Z7982 Long term (current) use of aspirin: Secondary | ICD-10-CM | POA: Insufficient documentation

## 2014-08-23 DIAGNOSIS — Z87891 Personal history of nicotine dependence: Secondary | ICD-10-CM | POA: Diagnosis not present

## 2014-08-23 DIAGNOSIS — E785 Hyperlipidemia, unspecified: Secondary | ICD-10-CM | POA: Insufficient documentation

## 2014-08-23 DIAGNOSIS — F419 Anxiety disorder, unspecified: Secondary | ICD-10-CM | POA: Insufficient documentation

## 2014-08-23 DIAGNOSIS — E039 Hypothyroidism, unspecified: Secondary | ICD-10-CM | POA: Insufficient documentation

## 2014-08-23 DIAGNOSIS — Z8669 Personal history of other diseases of the nervous system and sense organs: Secondary | ICD-10-CM | POA: Insufficient documentation

## 2014-08-23 DIAGNOSIS — F319 Bipolar disorder, unspecified: Secondary | ICD-10-CM | POA: Insufficient documentation

## 2014-08-23 DIAGNOSIS — E119 Type 2 diabetes mellitus without complications: Secondary | ICD-10-CM | POA: Insufficient documentation

## 2014-08-23 DIAGNOSIS — Z8541 Personal history of malignant neoplasm of cervix uteri: Secondary | ICD-10-CM | POA: Diagnosis not present

## 2014-08-23 NOTE — ED Notes (Signed)
Per EMS- c/o anxiety. Says "I'm on a bunch of medications and I don't think I should be on them." Takes Metoprolol (she thinks) which is making her feel anxious and nauseous. Pt is angry at her doctor for prescribing this medication. Is legally blind and unable to make it to the PCP office. VS: BP 120/70 HR 78. Has not taken morning meds. Recently taken off Flexeril, Cymbalta, Aspirin, Lasix, Gabapentin and she would like to be back on these medications. Concerned she is taking Lisinopril and Metoprolol today. Patient reports excessive diaphoresis. Skin clean dry intact upon arrival. Ambulatory with steady gait.

## 2014-08-23 NOTE — ED Provider Notes (Signed)
CSN: [id number]     Arrival date & time 08/23/14  1417 History   First MD Initiated Contact with Patient 08/23/14 1459     Chief Complaint  Patient presents with  . Anxiety     (Consider location/radiation/quality/duration/timing/severity/associated sxs/prior Treatment) HPI  Kelsey Hanna is a 64 y.o. female presents for evaluation of anxiety. She is concerned that her primary care doctor put her on metoprolol in an anxiety medicine. This upsets her. She is not suicidal or homicidal. She saw her psychiatrist at [facility] several times within the last week. Yesterday she was prescribed Depakote, but has not started taking yet. She denies recent illnesses, such as cough, shortness of breath, chest pain, or dizziness. There are no other known modifying factors.   Past Medical History  Diagnosis Date  . Bipolar 1 disorder   . Hypothyroid   . Dyslipidemia   . Retinal detachment   . Sleep apnea   . Diabetes mellitus     dx within last yr....just takes pills  . Cancer     cancer of uterus...no chemo or radiation   Past Surgical History  Procedure Laterality Date  . Abdominal hysterectomy    . Total abdominal hysterectomy w/ bilateral salpingoophorectomy    . Tonsillectomy    . Lumbar laminectomy/decompression microdiscectomy  07/17/2011    Procedure: LUMBAR LAMINECTOMY/DECOMPRESSION MICRODISCECTOMY;  Surgeon: Sinclair Ship, MD;  Location: [place];  Service: Orthopedics;  Laterality: Left;  Left sided lumbar 4-5 microdisectomy   History reviewed. No pertinent family history. History  Substance Use Topics  . Smoking status: Former Smoker -- 2.00 packs/day for 35 years    Types: Cigarettes    Quit date: 07/09/1999  . Smokeless tobacco: Not on file  . Alcohol Use: No     Comment: socially   OB History    No data available     Review of Systems  All other systems reviewed and are negative.     Allergies  Lithium  Home Medications   Prior to Admission medications    Medication Sig Start Date End Date Taking? Authorizing Provider  aspirin EC 325 MG EC tablet Take 1 tablet (325 mg total) by mouth daily. 04/24/14   Florencia Reasons, MD  atorvastatin (LIPITOR) 40 MG tablet Take 40 mg by mouth every evening.     Historical Provider, MD  Chlorhexidine Gluconate Cloth 2 % PADS Apply 6 each topically daily at 6 (six) AM.    Historical Provider, MD  colchicine 0.6 MG tablet Take 0.6 mg by mouth daily.    Historical Provider, MD  cyclobenzaprine (FLEXERIL) 10 MG tablet Take 1 tablet (10 mg total) by mouth 3 (three) times daily as needed for muscle spasms. 04/24/14   Florencia Reasons, MD  divalproex (DEPAKOTE) 500 MG DR tablet Take 500 mg by mouth every morning. Take 1 tablet in the morning and 2 tablets in the evening    Historical Provider, MD  DULoxetine (CYMBALTA) 30 MG capsule Take 30 mg by mouth daily.    Historical Provider, MD  furosemide (LASIX) 80 MG tablet Take 80 mg by mouth daily.    Historical Provider, MD  gabapentin (NEURONTIN) 300 MG capsule Take 300 mg by mouth 3 (three) times daily.    Historical Provider, MD  levothyroxine (SYNTHROID, LEVOTHROID) 137 MCG tablet Take 137 mcg by mouth daily before breakfast.    Historical Provider, MD  levothyroxine (SYNTHROID, LEVOTHROID) 175 MCG tablet Take 175 mcg by mouth daily before breakfast.    Historical  Provider, MD  lisinopril (PRINIVIL,ZESTRIL) 10 MG tablet Take 10 mg by mouth daily. 05/11/14   Historical Provider, MD  lisinopril (PRINIVIL,ZESTRIL) 5 MG tablet Take 1 tablet (5 mg total) by mouth daily. Patient not taking: Reported on 07/04/2014 04/24/14   Florencia Reasons, MD  meclizine (ANTIVERT) 12.5 MG tablet Take 1 tablet (12.5 mg total) by mouth 3 (three) times daily as needed for dizziness. 07/04/14   Wandra Arthurs, MD  metFORMIN (GLUCOPHAGE) 500 MG tablet Take 500 mg by mouth 2 (two) times daily with a meal.    Historical Provider, MD  metoprolol (LOPRESSOR) 50 MG tablet Take 1 tablet (50 mg total) by mouth 2 (two) times daily with a  meal. 04/24/14   Florencia Reasons, MD  morphine (MSIR) 15 MG tablet Take 1 tablet by mouth 4 (four) times daily as needed. 07/03/14   Historical Provider, MD  mupirocin ointment (BACTROBAN) 2 % Place 1 application into the nose 2 (two) times daily. Patient not taking: Reported on 07/04/2014 04/24/14   Florencia Reasons, MD  potassium chloride (K-DUR) 10 MEQ tablet Take 2 tablets (20 mEq total) by mouth daily. 04/24/14   Florencia Reasons, MD  PRESCRIPTION MEDICATION Take 1 tablet by mouth at bedtime as needed (for sleep).    Historical Provider, MD  QUEtiapine (SEROQUEL) 100 MG tablet Take 1 tablet (100 mg total) by mouth 2 (two) times daily. 04/24/14   Florencia Reasons, MD  tapentadol (NUCYNTA) 50 MG TABS tablet Take 50 mg by mouth every 6 (six) hours as needed.    Historical Provider, MD  VOLTAREN 1 % GEL Apply 1 application topically daily. 06/29/13   Historical Provider, MD   BP 148/78 mmHg  Pulse 80  Temp(Src) 98.2 F (36.8 C) (Oral)  Resp 16  SpO2 98% Physical Exam  Constitutional: She is oriented to person, place, and time. She appears well-developed and well-nourished.  HENT:  Head: Normocephalic and atraumatic.  Right Ear: External ear normal.  Left Ear: External ear normal.  Eyes: Conjunctivae and EOM are normal. Pupils are equal, round, and reactive to light.  Neck: Normal range of motion and phonation normal. Neck supple.  Cardiovascular: Normal rate, regular rhythm and normal heart sounds.   Pulmonary/Chest: Effort normal and breath sounds normal. She exhibits no bony tenderness.  Abdominal: Soft. There is no tenderness.  Musculoskeletal: Normal range of motion.  Neurological: She is alert and oriented to person, place, and time. No cranial nerve deficit or sensory deficit. She exhibits normal muscle tone. Coordination normal.  Skin: Skin is warm, dry and intact.  Psychiatric: Her behavior is normal.  Anxious. Rambling thought process. She is able to understand and appreciate the reasons she is here, and the type of care  that we can give her.  Nursing note and vitals reviewed.   ED Course  Procedures (including critical care time)  Findings discussed with patient, all questions answered.  Labs Review Labs Reviewed - No data to display  Imaging Review No results found.   EKG Interpretation None      MDM   Final diagnoses:  Anxiety    Anxiety without acute delusions or psychosis. No indication for further intervention at this time. The patient is well connected at the local  psychiatric treatment facility where she can get further care, as needed. There is no indication for acute infectious or metabolic processes.  Nursing Notes Reviewed/ Care Coordinated Applicable Imaging Reviewed Interpretation of Laboratory Data incorporated into ED treatment  The patient appears reasonably screened  and/or stabilized for discharge and I doubt any other medical condition or other [facility] requiring further screening, evaluation, or treatment in the ED at this time prior to discharge.  Plan: Home Medications- usual; Home Treatments- rest; return here if the recommended treatment, does not improve the symptoms; Recommended follow up- PCP prn   Daleen Bo, MD 08/23/14 1526

## 2014-08-27 ENCOUNTER — Emergency Department (HOSPITAL_COMMUNITY): Payer: Medicare Other

## 2014-08-27 ENCOUNTER — Emergency Department (HOSPITAL_COMMUNITY)
Admission: EM | Admit: 2014-08-27 | Discharge: 2014-08-30 | Disposition: A | Discharge status: Home / Self Care | Payer: Medicare Other | Attending: Emergency Medicine | Admitting: Emergency Medicine

## 2014-08-27 ENCOUNTER — Encounter (HOSPITAL_COMMUNITY): Payer: Self-pay | Admitting: Emergency Medicine

## 2014-08-27 DIAGNOSIS — E785 Hyperlipidemia, unspecified: Secondary | ICD-10-CM | POA: Diagnosis not present

## 2014-08-27 DIAGNOSIS — Z01818 Encounter for other preprocedural examination: Secondary | ICD-10-CM | POA: Insufficient documentation

## 2014-08-27 DIAGNOSIS — F329 Major depressive disorder, single episode, unspecified: Secondary | ICD-10-CM | POA: Diagnosis not present

## 2014-08-27 DIAGNOSIS — F319 Bipolar disorder, unspecified: Secondary | ICD-10-CM | POA: Diagnosis present

## 2014-08-27 DIAGNOSIS — Z859 Personal history of malignant neoplasm, unspecified: Secondary | ICD-10-CM | POA: Insufficient documentation

## 2014-08-27 DIAGNOSIS — E119 Type 2 diabetes mellitus without complications: Secondary | ICD-10-CM | POA: Insufficient documentation

## 2014-08-27 DIAGNOSIS — R45851 Suicidal ideations: Secondary | ICD-10-CM | POA: Diagnosis not present

## 2014-08-27 DIAGNOSIS — Z8669 Personal history of other diseases of the nervous system and sense organs: Secondary | ICD-10-CM | POA: Diagnosis not present

## 2014-08-27 DIAGNOSIS — E039 Hypothyroidism, unspecified: Secondary | ICD-10-CM | POA: Diagnosis not present

## 2014-08-27 DIAGNOSIS — F309 Manic episode, unspecified: Secondary | ICD-10-CM | POA: Diagnosis not present

## 2014-08-27 DIAGNOSIS — F419 Anxiety disorder, unspecified: Secondary | ICD-10-CM | POA: Diagnosis present

## 2014-08-27 LAB — URINALYSIS W MICROSCOPIC (NOT AT ARMC)
BILIRUBIN URINE: NEGATIVE
Glucose, UA: NEGATIVE mg/dL
Hgb urine dipstick: NEGATIVE
Ketones, ur: NEGATIVE mg/dL
Leukocytes, UA: NEGATIVE
Nitrite: NEGATIVE
Protein, ur: NEGATIVE mg/dL
SPECIFIC GRAVITY, URINE: 1.008 (ref 1.005–1.030)
UROBILINOGEN UA: 1 mg/dL (ref 0.0–1.0)
pH: 6 (ref 5.0–8.0)

## 2014-08-27 LAB — CBC WITH DIFFERENTIAL/PLATELET
Basophils Absolute: 0 10*3/uL (ref 0.0–0.1)
Basophils Relative: 0 % (ref 0–1)
Eosinophils Absolute: 0 10*3/uL (ref 0.0–0.7)
Eosinophils Relative: 0 % (ref 0–5)
HCT: 37.7 % (ref 36.0–46.0)
HEMOGLOBIN: 12.4 g/dL (ref 12.0–15.0)
Lymphocytes Relative: 47 % — ABNORMAL HIGH (ref 12–46)
Lymphs Abs: 4.4 10*3/uL — ABNORMAL HIGH (ref 0.7–4.0)
MCH: 31.2 pg (ref 26.0–34.0)
MCHC: 32.9 g/dL (ref 30.0–36.0)
MCV: 94.7 fL (ref 78.0–100.0)
MONOS PCT: 8 % (ref 3–12)
Monocytes Absolute: 0.8 10*3/uL (ref 0.1–1.0)
Neutro Abs: 4.2 10*3/uL (ref 1.7–7.7)
Neutrophils Relative %: 45 % (ref 43–77)
Platelets: 250 10*3/uL (ref 150–400)
RBC: 3.98 MIL/uL (ref 3.87–5.11)
RDW: 13.3 % (ref 11.5–15.5)
WBC: 9.4 10*3/uL (ref 4.0–10.5)

## 2014-08-27 LAB — BASIC METABOLIC PANEL
ANION GAP: 12 (ref 5–15)
BUN: 18 mg/dL (ref 6–20)
CALCIUM: 9.3 mg/dL (ref 8.9–10.3)
CO2: 22 mmol/L (ref 22–32)
Chloride: 105 mmol/L (ref 101–111)
Creatinine, Ser: 0.69 mg/dL (ref 0.44–1.00)
GFR calc Af Amer: >60 mL/min (ref >60)
Glucose, Bld: 90 mg/dL (ref 65–99)
Potassium: 4.3 mmol/L (ref 3.5–5.1)
SODIUM: 139 mmol/L (ref 135–145)

## 2014-08-27 LAB — T4, FREE: Free T4: 2.1 ng/dL — ABNORMAL HIGH (ref 0.61–1.12)

## 2014-08-27 LAB — TSH: TSH: 0.078 u[IU]/mL — AB (ref 0.350–4.500)

## 2014-08-27 LAB — VALPROIC ACID LEVEL: VALPROIC ACID LVL: 34 ug/mL — AB (ref 50.0–100.0)

## 2014-08-27 MED ORDER — METOPROLOL TARTRATE 25 MG PO TABS
50.0000 mg | ORAL_TABLET | Freq: Two times a day (BID) | ORAL | Status: DC
  Administered 2014-08-27 – 2014-08-28 (×4): 50 mg via ORAL
  Filled 2014-08-27 (×7): qty 2

## 2014-08-27 MED ORDER — METFORMIN HCL 500 MG PO TABS
500.0000 mg | ORAL_TABLET | Freq: Two times a day (BID) | ORAL | Status: DC
  Administered 2014-08-27 – 2014-08-29 (×5): 500 mg via ORAL
  Filled 2014-08-27 (×9): qty 1

## 2014-08-27 MED ORDER — QUETIAPINE FUMARATE 100 MG PO TABS
100.0000 mg | ORAL_TABLET | Freq: Two times a day (BID) | ORAL | Status: DC
  Administered 2014-08-27 – 2014-08-30 (×7): 100 mg via ORAL
  Filled 2014-08-27 (×7): qty 1

## 2014-08-27 MED ORDER — LISINOPRIL 5 MG PO TABS
5.0000 mg | ORAL_TABLET | Freq: Every day | ORAL | Status: DC
  Administered 2014-08-27 – 2014-08-29 (×3): 5 mg via ORAL
  Filled 2014-08-27 (×4): qty 1

## 2014-08-27 MED ORDER — LORAZEPAM 1 MG PO TABS
1.0000 mg | ORAL_TABLET | Freq: Three times a day (TID) | ORAL | Status: DC | PRN
  Administered 2014-08-27 – 2014-08-30 (×4): 1 mg via ORAL
  Filled 2014-08-27 (×4): qty 1

## 2014-08-27 MED ORDER — CYCLOBENZAPRINE HCL 10 MG PO TABS
10.0000 mg | ORAL_TABLET | Freq: Three times a day (TID) | ORAL | Status: DC | PRN
  Administered 2014-08-27 – 2014-08-30 (×9): 10 mg via ORAL
  Filled 2014-08-27 (×8): qty 1

## 2014-08-27 MED ORDER — MORPHINE SULFATE 15 MG PO TABS
15.0000 mg | ORAL_TABLET | Freq: Four times a day (QID) | ORAL | Status: DC | PRN
  Administered 2014-08-27 – 2014-08-30 (×7): 15 mg via ORAL
  Filled 2014-08-27 (×7): qty 1

## 2014-08-27 MED ORDER — ACETAMINOPHEN 325 MG PO TABS
650.0000 mg | ORAL_TABLET | ORAL | Status: DC | PRN
  Filled 2014-08-27: qty 2

## 2014-08-27 MED ORDER — ZOLPIDEM TARTRATE 5 MG PO TABS
5.0000 mg | ORAL_TABLET | Freq: Every evening | ORAL | Status: DC | PRN
  Administered 2014-08-28 (×2): 5 mg via ORAL
  Filled 2014-08-27 (×3): qty 1

## 2014-08-27 MED ORDER — ONDANSETRON HCL 4 MG PO TABS: 4.0000 mg | ORAL_TABLET | Freq: Three times a day (TID) | ORAL | Status: DC | PRN

## 2014-08-27 MED ORDER — ASPIRIN EC 325 MG PO TBEC
325.0000 mg | DELAYED_RELEASE_TABLET | Freq: Every day | ORAL | Status: DC
  Administered 2014-08-27 – 2014-08-30 (×4): 325 mg via ORAL
  Filled 2014-08-27 (×4): qty 1

## 2014-08-27 MED ORDER — DIVALPROEX SODIUM ER 500 MG PO TB24
500.0000 mg | ORAL_TABLET | Freq: Every day | ORAL | Status: DC
  Administered 2014-08-27 – 2014-08-29 (×3): 500 mg via ORAL
  Filled 2014-08-27 (×4): qty 1

## 2014-08-27 NOTE — ED Notes (Signed)
Daughter's number is [phone].  Patient has signed releases of information to her Sharyn Lull Felt).

## 2014-08-27 NOTE — BH Assessment (Signed)
0630:  Consulted with Dr. Kathrynn Humble about Patient.  Reports Patient has a history of Bipolar is manic and has not slept in days, no appetite, weight loss, and pressured speech.  Patient is seen at [facility], was not seen by Psychiatrist at last visit.    3888:  Start assessment  0645:  Complete assessment.  7579:  Consulted with Extender Serena Colonel, NP.  Per Extender; Patient be evaluated by psychiatry this morning to start medication regimen and schedule follow up appointment at [facility].  0652:  Provided Patient disposition to Dr. Kathrynn Humble.

## 2014-08-27 NOTE — Consult Note (Signed)
[place] Psychiatry Consult   Reason for Consult: Depressed, suicidal ideation, manic  Referring Physician:  EDP Patient Identification: Kelsey Hanna MRN:  [id number] Principal Diagnosis: <principal problem not specified> Diagnosis:   Patient Active Problem List   Diagnosis Date Noted  . PNA (pneumonia) [J18.9] 04/27/2014  . Type 2 diabetes mellitus [E11.9] 04/27/2014  . Essential hypertension [I10] 04/27/2014  . Hypothyroidism [E03.9] 04/27/2014  . Tobacco abuse [Z72.0] 04/27/2014  . Bipolar I disorder, most recent episode (or current) unspecified [F31.9] 04/20/2014  . Acute encephalopathy [G93.40] 04/18/2014  . Unresponsiveness [R40.4]   . Acute respiratory failure [J96.00]   . Encounter for feeding tube placement [Z87.898]   . Rapid atrial fibrillation [I48.91]   . Shortness of breath [R06.02]     Total Time spent with patient: 30 minutes  Subjective:   Kelsey Hanna is a 64 y.o. female patient admitted with depression and manic behavior.  HPI:  This patient is a 64 year old white female with history of bipolar disorder. She states that she lives in an retirement community in her own home. She claims that her family doctor change a lot of her medications but there is no evidence for this in the record. She states she hasn't slept for days that she is very depressed and is thinking about dying. She does not have any specific plan to kill herself but feels that she would be better off dead. She states that she has no purpose for living. She also claims that her thoughts are racing.  In reviewing the old records indicates that she is supposed to be on Depakote and Seroquel. She was hospitalized in March and saw Dr. Christinia Gully in consultation and these medications were continued then. She did not receive outpatient follow-up until last week when she went to [facility] and Depakote was reinitiated. She is very anxious today and still feels quite depressed. HPI Elements:    Location:  global. Quality:  severe. Severity:  severe. Timing:  several weeks. Duration:  years. Context:  medication noncompliance.  Past Medical History:  Past Medical History  Diagnosis Date  . Bipolar 1 disorder   . Hypothyroid   . Dyslipidemia   . Retinal detachment   . Sleep apnea   . Diabetes mellitus     dx within last yr....just takes pills  . Cancer     cancer of uterus...no chemo or radiation    Past Surgical History  Procedure Laterality Date  . Abdominal hysterectomy    . Total abdominal hysterectomy w/ bilateral salpingoophorectomy    . Tonsillectomy    . Lumbar laminectomy/decompression microdiscectomy  07/17/2011    Procedure: LUMBAR LAMINECTOMY/DECOMPRESSION MICRODISCECTOMY;  Surgeon: Sinclair Ship, MD;  Location: [place];  Service: Orthopedics;  Laterality: Left;  Left sided lumbar 4-5 microdisectomy   Family History: No family history on file. Social History:  History  Alcohol Use No    Comment: socially     History  Drug Use No    History   Social History  . Marital Status: Divorced    Spouse Name: N/A  . Number of Children: N/A  . Years of Education: N/A   Social History Main Topics  . Smoking status: Former Smoker -- 2.00 packs/day for 35 years    Types: Cigarettes    Quit date: 07/09/1999  . Smokeless tobacco: Not on file  . Alcohol Use: No     Comment: socially  . Drug Use: No  . Sexual Activity: Not on file  Other Topics Concern  . None   Social History Narrative   Additional Social History:                          Allergies:   Allergies  Allergen Reactions  . Lithium Other (See Comments)    "makes me feel spaced out" slurred speech    Labs:  Results for orders placed or performed during the hospital encounter of 08/27/14 (from the past 48 hour(s))  Valproic acid level     Status: Abnormal   Collection Time: 08/27/14  4:21 AM  Result Value Ref Range   Valproic Acid Lvl 34 (L) 50.0 - 100.0 ug/mL   Basic metabolic panel     Status: None   Collection Time: 08/27/14  4:32 AM  Result Value Ref Range   Sodium 139 135 - 145 mmol/L   Potassium 4.3 3.5 - 5.1 mmol/L   Chloride 105 101 - 111 mmol/L   CO2 22 22 - 32 mmol/L   Glucose, Bld 90 65 - 99 mg/dL   BUN 18 6 - 20 mg/dL   Creatinine, Ser 0.69 0.44 - 1.00 mg/dL   Calcium 9.3 8.9 - 10.3 mg/dL   GFR calc non Af Amer >60 >60 mL/min   GFR calc Af Amer >60 >60 mL/min    Comment: (NOTE) The eGFR has been calculated using the CKD EPI equation. This calculation has not been validated in all clinical situations. eGFR's persistently <60 mL/min signify possible Chronic Kidney Disease.    Anion gap 12 5 - 15  CBC with Differential/Platelet     Status: Abnormal   Collection Time: 08/27/14  6:00 AM  Result Value Ref Range   WBC 9.4 4.0 - 10.5 K/uL   RBC 3.98 3.87 - 5.11 MIL/uL   Hemoglobin 12.4 12.0 - 15.0 g/dL   HCT 37.7 36.0 - 46.0 %   MCV 94.7 78.0 - 100.0 fL   MCH 31.2 26.0 - 34.0 pg   MCHC 32.9 30.0 - 36.0 g/dL   RDW 13.3 11.5 - 15.5 %   Platelets 250 150 - 400 K/uL   Neutrophils Relative % 45 43 - 77 %   Neutro Abs 4.2 1.7 - 7.7 K/uL   Lymphocytes Relative 47 (H) 12 - 46 %   Lymphs Abs 4.4 (H) 0.7 - 4.0 K/uL   Monocytes Relative 8 3 - 12 %   Monocytes Absolute 0.8 0.1 - 1.0 K/uL   Eosinophils Relative 0 0 - 5 %   Eosinophils Absolute 0.0 0.0 - 0.7 K/uL   Basophils Relative 0 0 - 1 %   Basophils Absolute 0.0 0.0 - 0.1 K/uL  TSH     Status: Abnormal   Collection Time: 08/27/14  9:59 AM  Result Value Ref Range   TSH 0.078 (L) 0.350 - 4.500 uIU/mL    Vitals: Blood pressure 153/90, pulse 91, temperature 98.8 F (37.1 C), temperature source Oral, resp. rate 18, height $RemoveBe'5\' 3"'fxuYDVjck$  (1.6 m), SpO2 95 %.  Risk to Self: Suicidal Ideation: No Suicidal Intent: No-Not Currently/Within Last 6 Months Is patient at risk for suicide?: No Suicidal Plan?: No Access to Means: No What has been your use of drugs/alcohol within the last 12  months?: Patient denied How many times?: 1 Other Self Harm Risks: No Triggers for Past Attempts: Other (Comment) (Living situation and mental health issues) Intentional Self Injurious Behavior: None Risk to Others: Homicidal Ideation: No Thoughts of Harm to Others: No Current  Homicidal Intent: No Current Homicidal Plan: No Access to Homicidal Means: No Identified Victim: N/A History of harm to others?: No Assessment of Violence: None Noted Violent Behavior Description: N/A Does patient have access to weapons?: No Criminal Charges Pending?: No Does patient have a court date: No Prior Inpatient Therapy: Prior Inpatient Therapy: Yes Prior Therapy Dates: March 2016 Prior Therapy Facilty/Provider(s): [facility] Reason for Treatment: Overdose Prior Outpatient Therapy: Prior Outpatient Therapy: Yes Prior Therapy Dates: Current Prior Therapy Facilty/Provider(s): [facility] Reason for Treatment: Bipolar Does patient have an ACCT team?: No Does patient have Intensive In-House Services?  : No Does patient have [facility] services? : Yes Does patient have P4CC services?: No  Current Facility-Administered Medications  Medication Dose Route Frequency Provider Last Rate Last Dose  . acetaminophen (TYLENOL) tablet 650 mg  650 mg Oral Q4H PRN Varney Biles, MD      . aspirin EC tablet 325 mg  325 mg Oral Daily Ankit Nanavati, MD   325 mg at 08/27/14 1004  . lisinopril (PRINIVIL,ZESTRIL) tablet 5 mg  5 mg Oral Daily Ankit Nanavati, MD   5 mg at 08/27/14 1004  . LORazepam (ATIVAN) tablet 1 mg  1 mg Oral Q8H PRN Varney Biles, MD   1 mg at 08/27/14 0829  . metFORMIN (GLUCOPHAGE) tablet 500 mg  500 mg Oral BID WC Ankit Nanavati, MD   500 mg at 08/27/14 1003  . metoprolol tartrate (LOPRESSOR) tablet 50 mg  50 mg Oral BID WC Varney Biles, MD   50 mg at 08/27/14 0829  . ondansetron (ZOFRAN) tablet 4 mg  4 mg Oral Q8H PRN Varney Biles, MD      . QUEtiapine (SEROQUEL) tablet 100 mg  100 mg Oral BID Varney Biles, MD   100 mg at 08/27/14 1022  . zolpidem (AMBIEN) tablet 5 mg  5 mg Oral QHS PRN Varney Biles, MD       Current Outpatient Prescriptions  Medication Sig Dispense Refill  . acetic acid-hydrocortisone (VOSOL-HC) otic solution Place 3 drops into the left ear daily as needed. For dizziness  0  . atorvastatin (LIPITOR) 40 MG tablet Take 40 mg by mouth every evening.     . colchicine 0.6 MG tablet Take 0.6 mg by mouth daily.    . divalproex (DEPAKOTE) 500 MG DR tablet Take 500 mg by mouth.     . fluticasone (FLONASE) 50 MCG/ACT nasal spray Place 2 sprays into both nostrils 2 (two) times daily.    Marland Kitchen levothyroxine (SYNTHROID, LEVOTHROID) 137 MCG tablet Take 137 mcg by mouth daily before breakfast.    . meclizine (ANTIVERT) 12.5 MG tablet Take 1 tablet (12.5 mg total) by mouth 3 (three) times daily as needed for dizziness. 20 tablet 0  . metFORMIN (GLUCOPHAGE) 500 MG tablet Take 500 mg by mouth 2 (two) times daily with a meal.    . morphine (MSIR) 15 MG tablet Take 1 tablet by mouth 4 (four) times daily as needed (for pain.).     Marland Kitchen oxyCODONE-acetaminophen (PERCOCET) 10-325 MG per tablet Take 1 tablet by mouth 4 (four) times daily as needed. For pain.    . potassium chloride (K-DUR) 10 MEQ tablet Take 2 tablets (20 mEq total) by mouth daily. 60 tablet 0  . QUEtiapine (SEROQUEL) 100 MG tablet Take 1 tablet (100 mg total) by mouth 2 (two) times daily. 60 tablet 0  . VOLTAREN 1 % GEL Apply 1 application topically daily.    Marland Kitchen aspirin EC 325 MG EC tablet Take  1 tablet (325 mg total) by mouth daily. (Patient not taking: Reported on 08/27/2014) 30 tablet 0  . cyclobenzaprine (FLEXERIL) 10 MG tablet Take 1 tablet (10 mg total) by mouth 3 (three) times daily as needed for muscle spasms. 30 tablet 0  . gabapentin (NEURONTIN) 300 MG capsule Take 300 mg by mouth 2 (two) times daily.     Marland Kitchen lisinopril (PRINIVIL,ZESTRIL) 5 MG tablet Take 1 tablet (5 mg total) by mouth daily. (Patient not taking: Reported on  07/04/2014) 30 tablet 0  . metoprolol (LOPRESSOR) 50 MG tablet Take 1 tablet (50 mg total) by mouth 2 (two) times daily with a meal. (Patient not taking: Reported on 08/27/2014) 60 tablet 0  . mupirocin ointment (BACTROBAN) 2 % Place 1 application into the nose 2 (two) times daily. (Patient not taking: Reported on 07/04/2014) 22 g 0    Musculoskeletal: Strength & Muscle Tone: decreased Gait & Station: unsteady Patient leans: N/A  Psychiatric Specialty Exam: Physical Exam  ROS  Blood pressure 153/90, pulse 91, temperature 98.8 F (37.1 C), temperature source Oral, resp. rate 18, height $RemoveBe'5\' 3"'vSdGENHVE$  (1.6 m), SpO2 95 %.There is no weight on file to calculate BMI.  General Appearance: Disheveled  Eye Sport and exercise psychologist::  Fair  Speech:  Pressured  Volume:  Normal  Mood:  Anxious, Depressed and Hopeless  Affect:  Constricted, Depressed and Labile  Thought Process:  Circumstantial and Disorganized  Orientation:  Full (Time, Place, and Person)  Thought Content:  Obsessions and Rumination  Suicidal Thoughts:  Yes.  without intent/plan  Homicidal Thoughts:  No  Memory:  Immediate;   Fair Recent;   Poor Remote;   Poor  Judgement:  Poor  Insight:  Lacking  Psychomotor Activity:  Decreased  Concentration:  Poor  Recall:  [place] of Knowledge:Good  Language: Good  Akathisia:  No  Handed:  Right  AIMS (if indicated):     Assets:  Communication Skills Desire for Improvement Social Support  ADL's:  Impaired  Cognition: WNL  Sleep:   poor   Medical Decision Making: Review of Psycho-Social Stressors (1), Review or order clinical lab tests (1), Review and summation of old records (2), Established Problem, Worsening (2), Review or order medicine tests (1) and Review of Medication Regimen & Side Effects (2)  Treatment Plan Summary: Daily contact with patient to assess and evaluate symptoms and progress in treatment and Medication management  Plan:  Recommend psychiatric Inpatient admission when medically  cleared. Disposition: Patient will be referred to inpatient geropsychiatry unit. In the interim her Seroquel and Depakote will be reinitiated.  Elis Rawlinson, [facility] 08/27/2014 11:51 AM

## 2014-08-27 NOTE — BH Assessment (Signed)
Assessment Note  Kelsey Hanna is an 64 y.o. female who was brought to [facility] by EMS.  The Patient presents orientated x3, mood "depressed", affect manic with pressured speech.  The Patient denied SI, HI, and AVH.  Patient reports not being medication adherent to all psychotropic medications.  Patient reports not sleeping in days and poor appetite and losing 30 lbs.  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  Patient also reports not receiving any services for the blind.  Patient reports needing assistance with ADLs and not having any adaptive equipment to read her mail, make telephone calls, and other sight needed activities.  Patient denied any substance use.  Patient reports receiving services from [facility], but not being able to get all psychotropic medications.  Patient reports previously having her Ambien precription filled by her PCP at [facility].  Patient reports being back on Depakote for the past week.  Patient was seen by [facility] in March and May 2016.          Axis I: Bipolar, Manic Axis II: Deferred Axis IV: other psychosocial or environmental problems, problems with access to health care services and problems with primary support group Axis V: 41-50 serious symptoms  Past Medical History:  Past Medical History  Diagnosis Date  . Bipolar 1 disorder   . Hypothyroid   . Dyslipidemia   . Retinal detachment   . Sleep apnea   . Diabetes mellitus     dx within last yr....just takes pills  . Cancer     cancer of uterus...no chemo or radiation    Past Surgical History  Procedure Laterality Date  . Abdominal hysterectomy    . Total abdominal hysterectomy w/ bilateral salpingoophorectomy    . Tonsillectomy    . Lumbar laminectomy/decompression microdiscectomy  07/17/2011    Procedure: LUMBAR LAMINECTOMY/DECOMPRESSION MICRODISCECTOMY;  Surgeon: Sinclair Ship,  MD;  Location: [place];  Service: Orthopedics;  Laterality: Left;  Left sided lumbar 4-5 microdisectomy    Family History: No family history on file.  Social History:  reports that she quit smoking about 15 years ago. Her smoking use included Cigarettes. She has a 70 pack-year smoking history. She does not have any smokeless tobacco history on file. She reports that she does not drink alcohol or use illicit drugs.  Additional Social History:     CIWA: CIWA-Ar BP: 153/90 mmHg Pulse Rate: 91 COWS:    Allergies:  Allergies  Allergen Reactions  . Lithium Other (See Comments)    "makes me feel spaced out" slurred speech    Home Medications:  (Not in a hospital admission)  OB/GYN Status:  No LMP recorded. Patient has had a hysterectomy.  General Assessment Data Location of Assessment: [facility] TTS Assessment: In system Is this a Tele or Face-to-Face Assessment?: Face-to-Face Is this an Initial Assessment or a Re-assessment for this encounter?: Initial Assessment Marital status: Single Maiden name: Sirmons Is patient pregnant?: No Pregnancy Status: No Living Arrangements: Alone Can pt return to current living arrangement?: Yes Admission Status: Voluntary Is patient capable of signing voluntary admission?: Yes Referral Source: Self/Family/Friend Insurance type: [place] Screening Exam ([place]) Medical Exam completed: Yes  Crisis Care Plan Living Arrangements: Alone Name of Psychiatrist: [name] Name of Therapist: [name]  Education Status Is patient currently in school?: No Current Grade: N/A Highest grade of school patient has completed:  12th Name of school: N/A Contact person: N/A  Risk to self with the past 6 months Suicidal Ideation: No Has patient been a risk to self within the past 6 months prior to admission? : Yes Suicidal Intent: No-Not Currently/Within Last 6 Months Has patient had any suicidal intent within the past 6 months  prior to admission? : No Is patient at risk for suicide?: No Suicidal Plan?: No Has patient had any suicidal plan within the past 6 months prior to admission? : No Access to Means: No What has been your use of drugs/alcohol within the last 12 months?: Patient denied Previous Attempts/Gestures: Yes How many times?: 1 Other Self Harm Risks: No Triggers for Past Attempts: Other (Comment) (Living situation and mental health issues) Intentional Self Injurious Behavior: None Family Suicide History: Unknown Recent stressful life event(s): Conflict (Comment), Trauma (Comment) (Estranged from adult children, not receiving in home service) Persecutory voices/beliefs?: No Depression: Yes Depression Symptoms: Loss of interest in usual pleasures (Does not ha e needed in-home services) Substance abuse history and/or treatment for substance abuse?: No Suicide prevention information given to non-admitted patients: Not applicable  Risk to Others within the past 6 months Homicidal Ideation: No Does patient have any lifetime risk of violence toward others beyond the six months prior to admission? : No Thoughts of Harm to Others: No Current Homicidal Intent: No Current Homicidal Plan: No Access to Homicidal Means: No Identified Victim: N/A History of harm to others?: No Assessment of Violence: None Noted Violent Behavior Description: N/A Does patient have access to weapons?: No Criminal Charges Pending?: No Does patient have a court date: No Is patient on probation?: No  Psychosis Hallucinations: None noted Delusions: None noted  Mental Status Report Appearance/Hygiene: In hospital gown, Disheveled Eye Contact: Poor Motor Activity: Restlessness Speech: Pressured, Logical/coherent Level of Consciousness: Alert Mood: Depressed Affect: Other (Comment) (manic) Anxiety Level: Moderate Thought Processes: Coherent, Relevant, Circumstantial Judgement: Partial Orientation: Person, Place,  Time Obsessive Compulsive Thoughts/Behaviors: None  Cognitive Functioning Concentration: Decreased Memory: Recent Intact, Remote Intact IQ: Average Insight: Fair Impulse Control: Poor Appetite: Poor Weight Loss: 30 Weight Gain: 0 Sleep: Decreased Total Hours of Sleep: 2 Vegetative Symptoms: Decreased grooming (Need assistance for ADLs)  ADLScreening [facility] Assessment Services) Patient's cognitive ability adequate to safely complete daily activities?: Yes Patient able to express need for assistance with ADLs?: Yes Independently performs ADLs?: No  Prior Inpatient Therapy Prior Inpatient Therapy: Yes Prior Therapy Dates: March 2016 Prior Therapy Facilty/Provider([facility]): [facility] Reason for Treatment: Overdose  Prior Outpatient Therapy Prior Outpatient Therapy: Yes Prior Therapy Dates: Current Prior Therapy Facilty/Provider(s): [facility] Reason for Treatment: Bipolar Does patient have an ACCT team?: No Does patient have Intensive In-House Services?  : No Does patient have [facility] services? 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, pin point vision.) Does the patient have difficulty concentrating, remembering, or making decisions?: No Patient able to express need for assistance with ADLs?: Yes Does the patient have difficulty dressing or bathing?: Yes (Patient has difficulty seeing.) Independently performs ADLs?: No Communication: Independent Dressing (OT): Needs assistance Grooming: Needs assistance Feeding: Needs assistance Bathing: Needs assistance Toileting: Needs assistance In/Out Bed: Needs assistance Walks in Home: Needs assistance Does the patient have difficulty walking or  climbing stairs?: Yes (  Patient reports using a cane and walker.) Weakness of Legs: Both Weakness of Arms/Hands: None  Home Assistive Devices/Equipment Home Assistive Devices/Equipment: Cane (specify quad or straight), Walker (specify type)    Abuse/Neglect Assessment (Assessment to be complete while patient is alone) Physical Abuse: Denies Verbal Abuse: Denies Sexual Abuse: Denies Exploitation of patient/patient's resources: Denies Self-Neglect: Denies Values / Beliefs Cultural Requests During Hospitalization: None Spiritual Requests During Hospitalization: None   Advance Directives (For Healthcare) Does patient have an advance directive?: No Would patient like information on creating an advanced directive?: No - patient declined information    Additional Information 1:1 In Past 12 Months?: Yes CIRT Risk: No Elopement Risk: No Does patient have medical clearance?: Yes     Disposition:  Disposition Initial Assessment Completed for this Encounter: Yes Disposition of Patient: Inpatient treatment program, Other dispositions (Evaluate by psychiatry) Type of inpatient treatment program: Adult Other disposition(s): Referred to outside facility (follow up appt at [facility])  On Site Evaluation by:   Reviewed with Physician:    Dey-Johnson,Jeanne Diefendorf 08/27/2014 7:13 AM

## 2014-08-27 NOTE — ED Notes (Addendum)
Pt from home via EMS. States she has "palpitations and pain all over.  Pt states she also has "dizziness, profuse diaphoresis, and burning in her feet". Pt did not appear diaphoretic to this RN She currently denies pain in her chest.

## 2014-08-27 NOTE — ED Notes (Signed)
Bed: LG49 Expected date:  Expected time:  Means of arrival:  Comments: EMS anxiety

## 2014-08-27 NOTE — ED Notes (Signed)
Bed: WA30 Expected date:  Expected time:  Means of arrival:  Comments: 

## 2014-08-27 NOTE — Progress Notes (Signed)
Disposition CSW completed referrals to the following inpatient Geri-Psych facilities:  [facility]  CSW will continue to follow patient for placement needs.  [facility] Disposition CSW [phone]

## 2014-08-27 NOTE — ED Notes (Signed)
Pt arrived to TCU cooperative but slightly anxious. Pt awaiting consult.

## 2014-08-27 NOTE — ED Notes (Signed)
Call to provider for requested pain medications.  Instructed to call back.

## 2014-08-27 NOTE — ED Notes (Signed)
Pt sleeping. Awaiting Metphormin from pharmacy.

## 2014-08-27 NOTE — ED Notes (Signed)
Patient refused lopressor.  States "It will make me too dizzy."

## 2014-08-28 ENCOUNTER — Ambulatory Visit: Payer: Medicare Other | Admitting: Neurology

## 2014-08-28 ENCOUNTER — Telehealth: Payer: Self-pay | Admitting: *Deleted

## 2014-08-28 DIAGNOSIS — F319 Bipolar disorder, unspecified: Secondary | ICD-10-CM | POA: Diagnosis not present

## 2014-08-28 DIAGNOSIS — Z01818 Encounter for other preprocedural examination: Secondary | ICD-10-CM | POA: Diagnosis not present

## 2014-08-28 NOTE — ED Notes (Signed)
Update POC to daughter Sharyn Lull).

## 2014-08-28 NOTE — Progress Notes (Signed)
Barbara at [facility] states pt is declined due to "not meeting inpatient criteria, needs do not fall under acute psych umbrella."  Sharren Bridge, MSW, [place] Work, Disposition  08/28/2014 [phone]

## 2014-08-28 NOTE — Progress Notes (Signed)
Seeking gero psych placement for pt.  Referral made: [facility]- per Vivien Rota- per [facility]- per [facility] -per Lattie Haw (for waitlist, no beds currently) [facility]- per Zollie Scale- per Carlyon Shadow  All other gero psych facilities contacted were at capacity today 08/28/14.  Sharren Bridge, MSW, [place] Clinical Social Work, Disposition  08/28/2014 [phone]

## 2014-08-28 NOTE — Consult Note (Signed)
[place] Psychiatry Consult   Reason for Consult: Depressed, suicidal ideation, manic  Referring Physician:  EDP Patient Identification: TALLI KIMMER MRN:  [id number] Principal Diagnosis: Bipolar I disorder, most recent episode (or current) unspecified Diagnosis:   Patient Active Problem List   Diagnosis Date Noted  . Bipolar I disorder, most recent episode (or current) unspecified [F31.9] 04/20/2014    Priority: High  . PNA (pneumonia) [J18.9] 04/27/2014  . Type 2 diabetes mellitus [E11.9] 04/27/2014  . Essential hypertension [I10] 04/27/2014  . Hypothyroidism [E03.9] 04/27/2014  . Tobacco abuse [Z72.0] 04/27/2014  . Acute encephalopathy [G93.40] 04/18/2014  . Unresponsiveness [R40.4]   . Acute respiratory failure [J96.00]   . Encounter for feeding tube placement [Z87.898]   . Rapid atrial fibrillation [I48.91]   . Shortness of breath [R06.02]     Total Time spent with patient: 25 minutes  Subjective:   KAMILYA WAKEMAN is a 64 y.o. female patient admitted with depression and manic behavior.  On 08/28/14, pt seen and chart reviewed with this NP and Dr. Darleene Cleaver. Pt continues to present as manic, tangential, and unstable. Pt reports that she ran out of 7 of her 14 meds about 4 months ago and that she feels this has contributed to her decline. Pt denies suicidal and homicidal ideation but is clearly manic; warrants inpatient admission.   HPI:  This patient is a 64 year old white female with history of bipolar disorder. She states that she lives in an [place] community in her own home. She claims that her family doctor change a lot of her medications but there is no evidence for this in the record. She states she hasn't slept for days that she is very depressed and is thinking about dying. She does not have any specific plan to kill herself but feels that she would be better off dead. She states that she has no purpose for living. She also claims that her thoughts are  racing.  In reviewing the old records indicates that she is supposed to be on Depakote and Seroquel. She was hospitalized in March and saw Dr. Christinia Gully in consultation and these medications were continued then. She did not receive outpatient follow-up until last week when she went to [facility] and Depakote was reinitiated. She is very anxious today and still feels quite depressed.  HPI Elements:   Location:  global. Quality:  severe. Severity:  severe. Timing:  several weeks. Duration:  years. Context:  medication noncompliance.  Past Medical History:  Past Medical History  Diagnosis Date  . Bipolar 1 disorder   . Hypothyroid   . Dyslipidemia   . Retinal detachment   . Sleep apnea   . Diabetes mellitus     dx within last yr....just takes pills  . Cancer     cancer of uterus...no chemo or radiation    Past Surgical History  Procedure Laterality Date  . Abdominal hysterectomy    . Total abdominal hysterectomy w/ bilateral salpingoophorectomy    . Tonsillectomy    . Lumbar laminectomy/decompression microdiscectomy  07/17/2011    Procedure: LUMBAR LAMINECTOMY/DECOMPRESSION MICRODISCECTOMY;  Surgeon: Sinclair Ship, MD;  Location: [place];  Service: Orthopedics;  Laterality: Left;  Left sided lumbar 4-5 microdisectomy   Family History: No family history on file. Social History:  History  Alcohol Use No    Comment: socially     History  Drug Use No    History   Social History  . Marital Status: Divorced    Spouse Name:  N/A  . Number of Children: N/A  . Years of Education: N/A   Social History Main Topics  . Smoking status: Former Smoker -- 2.00 packs/day for 35 years    Types: Cigarettes    Quit date: 07/09/1999  . Smokeless tobacco: Not on file  . Alcohol Use: No     Comment: socially  . Drug Use: No  . Sexual Activity: Not on file   Other Topics Concern  . None   Social History Narrative   Additional Social History:                           Allergies:   Allergies  Allergen Reactions  . Lithium Other (See Comments)    "makes me feel spaced out" slurred speech    Labs:  Results for orders placed or performed during the hospital encounter of 08/27/14 (from the past 48 hour(s))  Valproic acid level     Status: Abnormal   Collection Time: 08/27/14  4:21 AM  Result Value Ref Range   Valproic Acid Lvl 34 (L) 50.0 - 100.0 ug/mL  Basic metabolic panel     Status: None   Collection Time: 08/27/14  4:32 AM  Result Value Ref Range   Sodium 139 135 - 145 mmol/L   Potassium 4.3 3.5 - 5.1 mmol/L   Chloride 105 101 - 111 mmol/L   CO2 22 22 - 32 mmol/L   Glucose, Bld 90 65 - 99 mg/dL   BUN 18 6 - 20 mg/dL   Creatinine, Ser 0.69 0.44 - 1.00 mg/dL   Calcium 9.3 8.9 - 10.3 mg/dL   GFR calc non Af Amer >60 >60 mL/min   GFR calc Af Amer >60 >60 mL/min    Comment: (NOTE) The eGFR has been calculated using the CKD EPI equation. This calculation has not been validated in all clinical situations. eGFR's persistently <60 mL/min signify possible Chronic Kidney Disease.    Anion gap 12 5 - 15  CBC with Differential/Platelet     Status: Abnormal   Collection Time: 08/27/14  6:00 AM  Result Value Ref Range   WBC 9.4 4.0 - 10.5 K/uL   RBC 3.98 3.87 - 5.11 MIL/uL   Hemoglobin 12.4 12.0 - 15.0 g/dL   HCT 37.7 36.0 - 46.0 %   MCV 94.7 78.0 - 100.0 fL   MCH 31.2 26.0 - 34.0 pg   MCHC 32.9 30.0 - 36.0 g/dL   RDW 13.3 11.5 - 15.5 %   Platelets 250 150 - 400 K/uL   Neutrophils Relative % 45 43 - 77 %   Neutro Abs 4.2 1.7 - 7.7 K/uL   Lymphocytes Relative 47 (H) 12 - 46 %   Lymphs Abs 4.4 (H) 0.7 - 4.0 K/uL   Monocytes Relative 8 3 - 12 %   Monocytes Absolute 0.8 0.1 - 1.0 K/uL   Eosinophils Relative 0 0 - 5 %   Eosinophils Absolute 0.0 0.0 - 0.7 K/uL   Basophils Relative 0 0 - 1 %   Basophils Absolute 0.0 0.0 - 0.1 K/uL  TSH     Status: Abnormal   Collection Time: 08/27/14  9:59 AM  Result Value Ref Range   TSH 0.078 (L) 0.350 -  4.500 uIU/mL  T4, free     Status: Abnormal   Collection Time: 08/27/14 12:26 PM  Result Value Ref Range   Free T4 2.10 (H) 0.61 - 1.12 ng/dL    Comment:  Performed at [facility]  Urinalysis with microscopic     Status: None   Collection Time: 08/27/14  6:05 PM  Result Value Ref Range   Color, Urine YELLOW YELLOW   APPearance CLEAR CLEAR   Specific Gravity, Urine 1.008 1.005 - 1.030   pH 6.0 5.0 - 8.0   Glucose, UA NEGATIVE NEGATIVE mg/dL   Hgb urine dipstick NEGATIVE NEGATIVE   Bilirubin Urine NEGATIVE NEGATIVE   Ketones, ur NEGATIVE NEGATIVE mg/dL   Protein, ur NEGATIVE NEGATIVE mg/dL   Urobilinogen, UA 1.0 0.0 - 1.0 mg/dL   Nitrite NEGATIVE NEGATIVE   Leukocytes, UA NEGATIVE NEGATIVE   WBC, UA 3-6 <3 WBC/hpf    Vitals: Blood pressure 121/73, pulse 80, temperature 97.7 F (36.5 C), temperature source Oral, resp. rate 16, height _0  (1.6 m), SpO2 100 %.  Risk to Self: Suicidal Ideation: No Suicidal Intent: No-Not Currently/Within Last 6 Months Is patient at risk for suicide?: No Suicidal Plan?: No Access to Means: No What has been your use of drugs/alcohol within the last 12 months?: Patient denied How many times?: 1 Other Self Harm Risks: No Triggers for Past Attempts: Other (Comment) (Living situation and mental health issues) Intentional Self Injurious Behavior: None Risk to Others: Homicidal Ideation: No Thoughts of Harm to Others: No Current Homicidal Intent: No Current Homicidal Plan: No Access to Homicidal Means: No Identified Victim: N/A History of harm to others?: No Assessment of Violence: None Noted Violent Behavior Description: N/A Does patient have access to weapons?: No Criminal Charges Pending?: No Does patient have a court date: No Prior Inpatient Therapy: Prior Inpatient Therapy: Yes Prior Therapy Dates: March 2016 Prior Therapy Facilty/Provider([facility] Reason for Treatment: Overdose Prior Outpatient Therapy: Prior Outpatient Therapy:  Yes Prior Therapy Dates: Current Prior Therapy Facilty/Provider(s): [facility] Reason for Treatment: Bipolar Does patient have an ACCT team?: No Does patient have Intensive In-House Services?  : No Does patient have [facility] services? : Yes Does patient have P4[facility] services?: No  Current Facility-Administered Medications  Medication Dose Route Frequency Provider Last Rate Last Dose  . acetaminophen (TYLENOL) tablet 650 mg  650 mg Oral Q4H PRN Varney Biles, MD      . aspirin EC tablet 325 mg  325 mg Oral Daily Varney Biles, MD   325 mg at 08/28/14 0915  . cyclobenzaprine (FLEXERIL) tablet 10 mg  10 mg Oral TID PRN Ernestina Patches, MD   10 mg at 08/28/14 1535  . divalproex (DEPAKOTE ER) 24 hr tablet 500 mg  500 mg Oral QHS Cloria Spring, MD   500 mg at 08/27/14 2121  . lisinopril (PRINIVIL,ZESTRIL) tablet 5 mg  5 mg Oral Daily Varney Biles, MD   5 mg at 08/28/14 0915  . LORazepam (ATIVAN) tablet 1 mg  1 mg Oral Q8H PRN Varney Biles, MD   1 mg at 08/27/14 0829  . metFORMIN (GLUCOPHAGE) tablet 500 mg  500 mg Oral BID WC Varney Biles, MD   500 mg at 08/28/14 0724  . metoprolol tartrate (LOPRESSOR) tablet 50 mg  50 mg Oral BID WC Varney Biles, MD   50 mg at 08/28/14 0724  . morphine (MSIR) tablet 15 mg  15 mg Oral QID PRN Ernestina Patches, MD   15 mg at 08/28/14 1415  . ondansetron (ZOFRAN) tablet 4 mg  4 mg Oral Q8H PRN Ankit Nanavati, MD      . QUEtiapine (SEROQUEL) tablet 100 mg  100 mg Oral BID Varney Biles, MD   100  mg at 08/28/14 0915  . zolpidem (AMBIEN) tablet 5 mg  5 mg Oral QHS PRN Varney Biles, MD   5 mg at 08/28/14 0008   Current Outpatient Prescriptions  Medication Sig Dispense Refill  . acetic acid-hydrocortisone (VOSOL-HC) otic solution Place 3 drops into the left ear daily as needed. For dizziness  0  . atorvastatin (LIPITOR) 40 MG tablet Take 40 mg by mouth every evening.     . colchicine 0.6 MG tablet Take 0.6 mg by mouth daily.    . divalproex (DEPAKOTE) 500 MG DR  tablet Take 500 mg by mouth.     . fluticasone (FLONASE) 50 MCG/ACT nasal spray Place 2 sprays into both nostrils 2 (two) times daily.    Marland Kitchen levothyroxine (SYNTHROID, LEVOTHROID) 137 MCG tablet Take 137 mcg by mouth daily before breakfast.    . meclizine (ANTIVERT) 12.5 MG tablet Take 1 tablet (12.5 mg total) by mouth 3 (three) times daily as needed for dizziness. 20 tablet 0  . metFORMIN (GLUCOPHAGE) 500 MG tablet Take 500 mg by mouth 2 (two) times daily with a meal.    . morphine (MSIR) 15 MG tablet Take 1 tablet by mouth 4 (four) times daily as needed (for pain.).     Marland Kitchen oxyCODONE-acetaminophen (PERCOCET) 10-325 MG per tablet Take 1 tablet by mouth 4 (four) times daily as needed. For pain.    . potassium chloride (K-DUR) 10 MEQ tablet Take 2 tablets (20 mEq total) by mouth daily. 60 tablet 0  . QUEtiapine (SEROQUEL) 100 MG tablet Take 1 tablet (100 mg total) by mouth 2 (two) times daily. 60 tablet 0  . VOLTAREN 1 % GEL Apply 1 application topically daily.    Marland Kitchen aspirin EC 325 MG EC tablet Take 1 tablet (325 mg total) by mouth daily. (Patient not taking: Reported on 08/27/2014) 30 tablet 0  . cyclobenzaprine (FLEXERIL) 10 MG tablet Take 1 tablet (10 mg total) by mouth 3 (three) times daily as needed for muscle spasms. 30 tablet 0  . gabapentin (NEURONTIN) 300 MG capsule Take 300 mg by mouth 2 (two) times daily.     Marland Kitchen lisinopril (PRINIVIL,ZESTRIL) 5 MG tablet Take 1 tablet (5 mg total) by mouth daily. (Patient not taking: Reported on 07/04/2014) 30 tablet 0  . metoprolol (LOPRESSOR) 50 MG tablet Take 1 tablet (50 mg total) by mouth 2 (two) times daily with a meal. (Patient not taking: Reported on 08/27/2014) 60 tablet 0  . mupirocin ointment (BACTROBAN) 2 % Place 1 application into the nose 2 (two) times daily. (Patient not taking: Reported on 07/04/2014) 22 g 0    Musculoskeletal: Strength & Muscle Tone: decreased Gait & Station: unsteady Patient leans: N/A  Psychiatric Specialty Exam: Physical  Exam  Review of Systems  Psychiatric/Behavioral: Positive for depression and hallucinations (manic at this time). Negative for suicidal ideas. The patient is nervous/anxious and has insomnia.   All other systems reviewed and are negative.   Blood pressure 121/73, pulse 80, temperature 97.7 F (36.5 C), temperature source Oral, resp. rate 16, height _0  (1.6 m), SpO2 100 %.There is no weight on file to calculate BMI.  General Appearance: Disheveled  Eye Sport and exercise psychologist::  Fair  Speech:  Pressured  Volume:  Normal  Mood:  Anxious, Depressed and Hopeless  Affect:  Constricted, Depressed and Labile  Thought Process:  Circumstantial and Disorganized  Orientation:  Full (Time, Place, and Person)  Thought Content:  Obsessions and Rumination  Suicidal Thoughts:  No  Homicidal Thoughts:  No  Memory:  Immediate;   Fair Recent;   Poor Remote;   Poor  Judgement:  Poor  Insight:  Lacking  Psychomotor Activity:  Decreased  Concentration:  Poor  Recall:  [place] of Knowledge:Good  Language: Good  Akathisia:  No  Handed:  Right  AIMS (if indicated):     Assets:  Communication Skills Desire for Improvement Social Support  ADL's:  Impaired  Cognition: WNL  Sleep:   poor   Medical Decision Making: Review of Psycho-Social Stressors (1), Review or order clinical lab tests (1), Review and summation of old records (2), Established Problem, Worsening (2), Review or order medicine tests (1) and Review of Medication Regimen & Side Effects (2)  Treatment Plan Summary: Daily contact with patient to assess and evaluate symptoms and progress in treatment and Medication management  Plan:  Recommend psychiatric Inpatient admission when medically cleared.  Disposition:  -Seek inpatient psychiatric hospitalization for safety and stabilization  Benjamine Mola, FNP-BC 08/28/2014 3:40 PM Patient seen face-to-face for psychiatric evaluation, chart reviewed and case discussed with the physician extender and  developed treatment plan. Reviewed the information documented and agree with the treatment plan. Corena Pilgrim, MD

## 2014-08-28 NOTE — Telephone Encounter (Signed)
No showed new patient appointment. 

## 2014-08-29 ENCOUNTER — Ambulatory Visit: Payer: Self-pay | Admitting: Internal Medicine

## 2014-08-29 ENCOUNTER — Encounter: Payer: Self-pay | Admitting: Neurology

## 2014-08-29 DIAGNOSIS — F319 Bipolar disorder, unspecified: Secondary | ICD-10-CM

## 2014-08-29 DIAGNOSIS — Z01818 Encounter for other preprocedural examination: Secondary | ICD-10-CM | POA: Diagnosis not present

## 2014-08-29 LAB — CBG MONITORING, ED
Glucose-Capillary: 75 mg/dL (ref 65–99)
Glucose-Capillary: 85 mg/dL (ref 65–99)

## 2014-08-29 MED ORDER — FLUTICASONE PROPIONATE 50 MCG/ACT NA SUSP
2.0000 | Freq: Two times a day (BID) | NASAL | Status: DC
  Administered 2014-08-29 – 2014-08-30 (×3): 2 via NASAL
  Filled 2014-08-29: qty 16

## 2014-08-29 NOTE — BH Assessment (Signed)
Per Wyatt Portela, pt is declined at [facility] due to needing assistance with ADLs.  Lear Ng, [facility] Triage Specialist 08/29/2014 4:00 AM

## 2014-08-29 NOTE — BHH Counselor (Signed)
[place] Assessment Progress Note  Pt information faxed to [place], [place] for IP consideration, as they all said they had geri beds available today.   Kenna Gilbert. Lovena Le, [place], [place] Disposition Counselor

## 2014-08-29 NOTE — ED Notes (Signed)
Patient repeatedly comes out of room requesting assistance with one task or another (reading lotion bottles, getting something to drink).  She demands that medications be given at specific times even when rationale explained for scheduled times.  Redirected frequently by staff.

## 2014-08-29 NOTE — Progress Notes (Signed)
Followed up on efforts to secure inpatient gero psych placement.  [facility]- per Carlyon Shadow, possibly one gero bed, working through referrals presently [place] per Francesca Jewett, reviewing referrals presently, unsure of number of beds available today  At capacity for gero psych units: [facility]- per Roderic Palau, at capacity today [facility]- per Nunzio Cory, at [place]- per [place]- per [facility]- per Add[facility]- per [place]- per [facility]- per [facility] Litter- per [facility]- per [place]  Declined: [facility]- due to needing assistance with ADLS and legally blind status- "too medically acute for our unit" [facility]- per Pamala Hurry, due to "lack of admission criteria"  Sharren Bridge, MSW, [place] Work, Disposition  08/29/2014 [phone]

## 2014-08-29 NOTE — ED Notes (Signed)
Patient found to be standing nude in the bathroom, flushing milk and broccoli down the toilet.  She refuses offers for staff to throw any cups away that are currently on her bedside table.  She becomes defensive when the offer is made.

## 2014-08-29 NOTE — ED Notes (Signed)
Pt c/o that she was miss treated by [facility].  Pt requesting to file a complaint and to "speak with the D.O.N."!   Made Charge RN aware of request, who referred me to contact the [facility].  Spoke with [facility], unable to come speak with pt at this time, and advised by her to get Santiago Glad or Corene Cornea to come speak with pt "since they are in their offices".

## 2014-08-29 NOTE — ED Notes (Signed)
Ralene Bathe MD at bedside.  Per Dr Ralene Bathe pt doesn't need a sling for her right shoulder pain.

## 2014-08-29 NOTE — ED Provider Notes (Signed)
Pt complaining of 6 wks of right shoulder pain.  Pt able to fully range shoulder without difficulty, can cross midline without difficulty.  Pt does not need imaging or sling at this time.    Quintella Reichert, MD 08/29/14 1100

## 2014-08-29 NOTE — ED Notes (Signed)
Pt has water basin filled with water soaking her feet as she sits in recliner.

## 2014-08-29 NOTE — Consult Note (Signed)
[place] [facility] Consult   Reason for Consult: Depressed, suicidal ideation, manic  Referring Physician:  EDP Patient Identification: Kelsey Hanna MRN:  [id number] Principal Diagnosis: Bipolar I disorder, most recent episode (or current) unspecified Diagnosis:   Patient Active Problem List   Diagnosis Date Noted  . PNA (pneumonia) [J18.9] 04/27/2014  . Type 2 diabetes mellitus [E11.9] 04/27/2014  . Essential hypertension [I10] 04/27/2014  . Hypothyroidism [E03.9] 04/27/2014  . Tobacco abuse [Z72.0] 04/27/2014  . Bipolar I disorder, most recent episode (or current) unspecified [F31.9] 04/20/2014  . Acute encephalopathy [G93.40] 04/18/2014  . Unresponsiveness [R40.4]   . Acute respiratory failure [J96.00]   . Encounter for feeding tube placement [Z87.898]   . Rapid atrial fibrillation [I48.91]   . Shortness of breath [R06.02]     Total Time spent with patient: 25 minutes  Subjective:   Kelsey Hanna is a 64 y.o. female patient admitted with depression and manic behavior.  On 08/28/14, pt seen and chart reviewed with this NP and Dr. Darleene Cleaver. Pt continues to present as manic, tangential, and unstable. Pt reports that she ran out of 7 of her 14 meds about 4 months ago and that she feels this has contributed to her decline. Pt denies suicidal and homicidal ideation but is clearly manic; warrants inpatient admission.   HPI:  This patient is a 64 year old white female with history of bipolar disorder. She states that she lives in an retirement community in her own home. She claims that her family doctor change a lot of her medications but there is no evidence for this in the record. She states she hasn't slept for days that she is very depressed and is thinking about dying. She does not have any specific plan to kill herself but feels that she would be better off dead. She states that she has no purpose for living. She also claims that her thoughts are racing.  In  reviewing the old records indicates that she is supposed to be on Depakote and Seroquel. She was hospitalized in March and saw Dr. Christinia Gully in consultation and these medications were continued then. She did not receive outpatient follow-up until last week when she went to [facility] and Depakote was reinitiated. She is very anxious today and still feels quite depressed.  Reviewed above note with update from today.  Patient remains disorganized but pleasant at times.  Patient is compliant with her medications. We will continue with our plan of care, seeking placement at a gero-psychiatric unit.  HPI Elements:   Location:  global. Quality:  severe. Severity:  severe. Timing:  several weeks. Duration:  years. Context:  medication noncompliance.  Past Medical History:  Past Medical History  Diagnosis Date  . Bipolar 1 disorder   . Hypothyroid   . Dyslipidemia   . Retinal detachment   . Sleep apnea   . Diabetes mellitus     dx within last yr....just takes pills  . Cancer     cancer of uterus...no chemo or radiation    Past Surgical History  Procedure Laterality Date  . Abdominal hysterectomy    . Total abdominal hysterectomy w/ bilateral salpingoophorectomy    . Tonsillectomy    . Lumbar laminectomy/decompression microdiscectomy  07/17/2011    Procedure: LUMBAR LAMINECTOMY/DECOMPRESSION MICRODISCECTOMY;  Surgeon: Sinclair Ship, MD;  Location: [facility];  Service: Orthopedics;  Laterality: Left;  Left sided lumbar 4-5 microdisectomy   Family History: No family history on file. Social History:  History  Alcohol Use No  Comment: socially     History  Drug Use No    History   Social History  . Marital Status: Divorced    Spouse Name: N/A  . Number of Children: N/A  . Years of Education: N/A   Social History Main Topics  . Smoking status: Former Smoker -- 2.00 packs/day for 35 years    Types: Cigarettes    Quit date: 07/09/1999  . Smokeless tobacco: Not on file  .  Alcohol Use: No     Comment: socially  . Drug Use: No  . Sexual Activity: Not on file   Other Topics Concern  . None   Social History Narrative   Additional Social History:                          Allergies:   Allergies  Allergen Reactions  . Lithium Other (See Comments)    "makes me feel spaced out" slurred speech    Labs:  Results for orders placed or performed during the hospital encounter of 08/27/14 (from the past 48 hour(s))  Urinalysis with microscopic     Status: None   Collection Time: 08/27/14  6:05 PM  Result Value Ref Range   Color, Urine YELLOW YELLOW   APPearance CLEAR CLEAR   Specific Gravity, Urine 1.008 1.005 - 1.030   pH 6.0 5.0 - 8.0   Glucose, UA NEGATIVE NEGATIVE mg/dL   Hgb urine dipstick NEGATIVE NEGATIVE   Bilirubin Urine NEGATIVE NEGATIVE   Ketones, ur NEGATIVE NEGATIVE mg/dL   Protein, ur NEGATIVE NEGATIVE mg/dL   Urobilinogen, UA 1.0 0.0 - 1.0 mg/dL   Nitrite NEGATIVE NEGATIVE   Leukocytes, UA NEGATIVE NEGATIVE   WBC, UA 3-6 <3 WBC/hpf  CBG monitoring, ED     Status: None   Collection Time: 08/29/14  8:53 AM  Result Value Ref Range   Glucose-Capillary 75 65 - 99 mg/dL    Vitals: Blood pressure 146/84, pulse 94, temperature 98.6 F (37 C), temperature source Oral, resp. rate 18, height 5\' 3"  (1.6 m), SpO2 94 %.  Risk to Self: Suicidal Ideation: No Suicidal Intent: No-Not Currently/Within Last 6 Months Is patient at risk for suicide?: No Suicidal Plan?: No Access to Means: No What has been your use of drugs/alcohol within the last 12 months?: Patient denied How many times?: 1 Other Self Harm Risks: No Triggers for Past Attempts: Other (Comment) (Living situation and mental health issues) Intentional Self Injurious Behavior: None Risk to Others: Homicidal Ideation: No Thoughts of Harm to Others: No Current Homicidal Intent: No Current Homicidal Plan: No Access to Homicidal Means: No Identified Victim: N/A History of  harm to others?: No Assessment of Violence: None Noted Violent Behavior Description: N/A Does patient have access to weapons?: No Criminal Charges Pending?: No Does patient have a court date: No Prior Inpatient Therapy: Prior Inpatient Therapy: Yes Prior Therapy Dates: March 2016 Prior Therapy Facilty/Provider(s): [facility] Reason for Treatment: Overdose Prior Outpatient Therapy: Prior Outpatient Therapy: Yes Prior Therapy Dates: Current Prior Therapy Facilty/Provider(s): [facility] Reason for Treatment: Bipolar Does patient have an ACCT team?: No Does patient have Intensive In-House Services?  : No Does patient have [facility] services? : Yes Does patient have P4CC services?: No  Current Facility-Administered Medications  Medication Dose Route Frequency Provider Last Rate Last Dose  . acetaminophen (TYLENOL) tablet 650 mg  650 mg Oral Q4H PRN Varney Biles, MD      . aspirin EC tablet 325 mg  325 mg Oral Daily Varney Biles, MD   325 mg at 08/29/14 0948  . cyclobenzaprine (FLEXERIL) tablet 10 mg  10 mg Oral TID PRN Ernestina Patches, MD   10 mg at 08/29/14 0947  . divalproex (DEPAKOTE ER) 24 hr tablet 500 mg  500 mg Oral QHS Cloria Spring, MD   500 mg at 08/28/14 2138  . fluticasone (FLONASE) 50 MCG/ACT nasal spray 2 spray  2 spray Each Nare BID Quintella Reichert, MD   2 spray at 08/29/14 0950  . lisinopril (PRINIVIL,ZESTRIL) tablet 5 mg  5 mg Oral Daily Varney Biles, MD   5 mg at 08/29/14 0948  . LORazepam (ATIVAN) tablet 1 mg  1 mg Oral Q8H PRN Varney Biles, MD   1 mg at 08/28/14 1804  . metFORMIN (GLUCOPHAGE) tablet 500 mg  500 mg Oral BID WC Varney Biles, MD   500 mg at 08/29/14 0749  . metoprolol tartrate (LOPRESSOR) tablet 50 mg  50 mg Oral BID WC Varney Biles, MD   50 mg at 08/28/14 1804  . morphine (MSIR) tablet 15 mg  15 mg Oral QID PRN Ernestina Patches, MD   15 mg at 08/29/14 1358  . ondansetron (ZOFRAN) tablet 4 mg  4 mg Oral Q8H PRN Ankit Nanavati, MD      . QUEtiapine (SEROQUEL)  tablet 100 mg  100 mg Oral BID Varney Biles, MD   100 mg at 08/29/14 0948  . zolpidem (AMBIEN) tablet 5 mg  5 mg Oral QHS PRN Varney Biles, MD   5 mg at 08/28/14 2346   Current Outpatient Prescriptions  Medication Sig Dispense Refill  . acetic acid-hydrocortisone (VOSOL-HC) otic solution Place 3 drops into the left ear daily as needed. For dizziness  0  . atorvastatin (LIPITOR) 40 MG tablet Take 40 mg by mouth every evening.     . colchicine 0.6 MG tablet Take 0.6 mg by mouth daily.    . divalproex (DEPAKOTE) 500 MG DR tablet Take 500 mg by mouth.     . fluticasone (FLONASE) 50 MCG/ACT nasal spray Place 2 sprays into both nostrils 2 (two) times daily.    Marland Kitchen levothyroxine (SYNTHROID, LEVOTHROID) 137 MCG tablet Take 137 mcg by mouth daily before breakfast.    . meclizine (ANTIVERT) 12.5 MG tablet Take 1 tablet (12.5 mg total) by mouth 3 (three) times daily as needed for dizziness. 20 tablet 0  . metFORMIN (GLUCOPHAGE) 500 MG tablet Take 500 mg by mouth 2 (two) times daily with a meal.    . morphine (MSIR) 15 MG tablet Take 1 tablet by mouth 4 (four) times daily as needed (for pain.).     Marland Kitchen oxyCODONE-acetaminophen (PERCOCET) 10-325 MG per tablet Take 1 tablet by mouth 4 (four) times daily as needed. For pain.    . potassium chloride (K-DUR) 10 MEQ tablet Take 2 tablets (20 mEq total) by mouth daily. 60 tablet 0  . QUEtiapine (SEROQUEL) 100 MG tablet Take 1 tablet (100 mg total) by mouth 2 (two) times daily. 60 tablet 0  . VOLTAREN 1 % GEL Apply 1 application topically daily.    Marland Kitchen aspirin EC 325 MG EC tablet Take 1 tablet (325 mg total) by mouth daily. (Patient not taking: Reported on 08/27/2014) 30 tablet 0  . cyclobenzaprine (FLEXERIL) 10 MG tablet Take 1 tablet (10 mg total) by mouth 3 (three) times daily as needed for muscle spasms. 30 tablet 0  . gabapentin (NEURONTIN) 300 MG capsule Take 300 mg by mouth 2 (  two) times daily.     Marland Kitchen lisinopril (PRINIVIL,ZESTRIL) 5 MG tablet Take 1 tablet (5 mg  total) by mouth daily. (Patient not taking: Reported on 07/04/2014) 30 tablet 0  . metoprolol (LOPRESSOR) 50 MG tablet Take 1 tablet (50 mg total) by mouth 2 (two) times daily with a meal. (Patient not taking: Reported on 08/27/2014) 60 tablet 0  . mupirocin ointment (BACTROBAN) 2 % Place 1 application into the nose 2 (two) times daily. (Patient not taking: Reported on 07/04/2014) 22 g 0    Musculoskeletal: Strength & Muscle Tone: decreased Gait & Station: unsteady Patient leans: N/A  Psychiatric Specialty Exam: Physical Exam  Review of Systems  Psychiatric/Behavioral: Positive for depression and hallucinations (manic at this time). Negative for suicidal ideas. The patient is nervous/anxious and has insomnia.   All other systems reviewed and are negative.   Blood pressure 146/84, pulse 94, temperature 98.6 F (37 C), temperature source Oral, resp. rate 18, height 5\' 3"  (1.6 m), SpO2 94 %.There is no weight on file to calculate BMI.  General Appearance: Casual  Eye Contact::  Fair  Speech:  Pressured  Volume:  Normal  Mood:  Anxious, Depressed and Hopeless  Affect:  Constricted, Depressed and Labile  Thought Process:  Circumstantial and Disorganized  Orientation:  Full (Time, Place, and Person)  Thought Content:  Obsessions and Rumination  Suicidal Thoughts:  No  Homicidal Thoughts:  No  Memory:  Immediate;   Fair Recent;   Poor Remote;   Poor  Judgement:  Poor  Insight:  Lacking  Psychomotor Activity:  Decreased  Concentration:  Poor  Recall:  [place] of Knowledge:Good  Language: Good  Akathisia:  No  Handed:  Right  AIMS (if indicated):     Assets:  Communication Skills Desire for Improvement Social Support  ADL's:  Impaired  Cognition: WNL  Sleep:   poor   Medical Decision Making: Review of Psycho-Social Stressors (1), Review or order clinical lab tests (1), Review and summation of old records (2), Established Problem, Worsening (2), Review or order medicine tests  (1) and Review of Medication Regimen & Side Effects (2)  Treatment Plan Summary: Daily contact with patient to assess and evaluate symptoms and progress in treatment and Medication management  Plan:  Continue all home medications while we wait for bed placement at a Gero-psychiatric unit  Disposition:  -Seek inpatient psychiatric hospitalization for safety and stabilization  Delfin Gant, PMHNP-BC 08/29/2014 4:44 PM  Patient seen face-to-face for psychiatric evaluation, chart reviewed and case discussed with the physician extender and developed treatment plan. Reviewed the information documented and agree with the treatment plan. Corena Pilgrim, MD

## 2014-08-29 NOTE — Progress Notes (Signed)
Pt's united [employer] care response hx states pcp is Dr Colletta [place] powers EPI updated

## 2014-08-29 NOTE — ED Provider Notes (Signed)
Pt here with anxiety, paranoia.  She has chronic right shoulder pain.  She denies any chest pain, vomiting.  Pt has been medically cleared for psychiatric placement.    Quintella Reichert, MD 08/29/14 1601

## 2014-08-29 NOTE — ED Notes (Signed)
Kelsey Hanna took pt copy of her letter she wrote regarding pt's request to file complaint.

## 2014-08-29 NOTE — Progress Notes (Signed)
Pt cont to ask for large quantities of snacks, sodas, and coffee and requests to go to cafeteria to order food as well.  Pt has been provided with a significant amount of snacks throughout the evening.  Oriented pt to the time and encouraged pt that now is the time to rest not eat.  Discussed with pt need to stay on a good sleep/wake schedule.  Set limits with pt. Dimmed lights in room and informed pt that breakfast was usually served around 8am.  Pt requested a sign be made for her room to remind her of when breakfast is served.  Sign placed as pt requested.  Pt states she will try and go to sleep now.  Will cont to monitor. ~ Petra Kuba, RN

## 2014-08-29 NOTE — ED Notes (Signed)
Kelsey Hanna was making round through the department, so made aware of pt's complaint.  [name] at bedside with pt at this time.

## 2014-08-29 NOTE — ED Notes (Signed)
Pt come out to nursing desk stating that she needs a chopped plate bc she has trouble and that is what her diet is at home. Made Kelsey Hanna aware.

## 2014-08-30 DIAGNOSIS — Z01818 Encounter for other preprocedural examination: Secondary | ICD-10-CM | POA: Insufficient documentation

## 2014-08-30 LAB — CBG MONITORING, ED: Glucose-Capillary: 106 mg/dL — ABNORMAL HIGH (ref 65–99)

## 2014-08-30 MED ORDER — DIVALPROEX SODIUM ER 500 MG PO TB24: 500.0000 mg | ORAL_TABLET | Freq: Every day | ORAL | Status: DC

## 2014-08-30 MED ORDER — LISINOPRIL 5 MG PO TABS: 5.0000 mg | ORAL_TABLET | Freq: Every day | ORAL | Status: DC

## 2014-08-30 NOTE — ED Notes (Signed)
Patient repeatedly opens her door and asks for things.  She seems to forget what she asked for initially before she launches into a new round of questions.  Redirected frequently and encouraged to get some sleep.

## 2014-08-30 NOTE — Progress Notes (Signed)
CSW received phone call from daughter, Adrian Prows [phone]. Per Sharyn Lull, patient use to receive actt team services, and was discharged a few years ago for doing so well. Pt  Has been to [facility] for assessment and seen psychiatrist who stated that patient would benefit from actt. Pt daughter has talked to Herbie Baltimore from actt team regarding referral. Per daughter, Herbie Baltimore went to pt apartment last Friday to assess and needs an referral from the hosptial in order to get actt services authorized. Herbie Baltimore [phone]. CSW to discuss with patient to consent to release information to daughter and actt if interested. Pt lives in senior living apartment, independent. recievings meals on wheels. 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 Pt states she feels safe returning home but needs assistance. Pt states her daughter can come pick her up or she can go home by cab. Pt daughter in training well after 5pm. CSW to assist with cab voucher.   Belia Heman, [place] Work  [employer] [phone]

## 2014-08-30 NOTE — [facility] Suicide Risk Assessment (Cosign Needed)
Suicide Risk Assessment  Discharge Assessment   [facility] Discharge Suicide Risk Assessment   Demographic Factors:  Age 65 or older, Caucasian, Low socioeconomic status, Living alone and Unemployed  Total Time spent with patient: 20 minutes  Musculoskeletal: Strength & Muscle Tone: within normal limits Gait & Station: normal Patient leans: N/A  Psychiatric Specialty Exam:     Blood pressure 126/78, pulse 109, temperature 98.1 F (36.7 Hanna), temperature source Oral, resp. rate 18, height 5\' 3"  (1.6 m), SpO2 98 %.There is no weight on file to calculate BMI.  General Appearance: Casual and Fairly Groomed  Engineer, water::  Good  Speech:  Clear and Coherent and Normal Rate  Volume:  Normal  Mood:  Anxious  Affect:  Congruent  Thought Process:  Coherent  Orientation:  Full (Time, Place, and Person)  Thought Content:  Obsessions  Suicidal Thoughts:  No  Homicidal Thoughts:  No  Memory:  Immediate;   Good Recent;   Fair Remote;   Fair  Judgement:  Poor  Insight:  Fair  Psychomotor Activity:  Normal  Concentration:  Fair  Recall:  [employer] of Knowledge:Fair  Language: Good  Akathisia:  No  Handed:  Right  AIMS (if indicated):     Assets:  Communication Skills Desire for Improvement Social Support  ADL's:  Impaired  Cognition: WNL  Sleep:   poor         Has this patient used any form of tobacco in the last 30 days? (Cigarettes, Smokeless Tobacco, Cigars, and/or Pipes) N/A  Mental Status Per Nursing Assessment::   On Admission:     Current Mental Status by Physician: NA  Loss Factors: NA  Historical Factors: NA  Risk Reduction Factors:   Religious beliefs about death, Positive social support and Positive coping skills or problem solving skills  Continued Clinical Symptoms:  Bipolar Disorder:   Bipolar II Mixed State  Cognitive Features That Contribute To Risk:  Polarized thinking    Suicide Risk:  Minimal: No identifiable suicidal ideation.  Patients  presenting with no risk factors but with morbid ruminations; may be classified as minimal risk based on the severity of the depressive symptoms  Principal Problem: Bipolar I disorder, most recent episode (or current) unspecified Discharge Diagnoses:  Patient Active Problem List   Diagnosis Date Noted  . PNA (pneumonia) [J18.9] 04/27/2014  . Type 2 diabetes mellitus [E11.9] 04/27/2014  . Essential hypertension [I10] 04/27/2014  . Hypothyroidism [E03.9] 04/27/2014  . Tobacco abuse [Z72.0] 04/27/2014  . Bipolar I disorder, most recent episode (or current) unspecified [F31.9] 04/20/2014  . Acute encephalopathy [G93.40] 04/18/2014  . Unresponsiveness [R40.4]   . Acute respiratory failure [J96.00]   . Encounter for feeding tube placement [Z87.898]   . Rapid atrial fibrillation [I48.91]   . Shortness of breath [R06.02]       Plan Of Care/Follow-up recommendations:  Activity:  as tolerated Diet:  regular  Is patient on multiple antipsychotic therapies at discharge:  No   Has Patient had three or more failed trials of antipsychotic monotherapy by history:  No  Recommended Plan for Multiple Antipsychotic Therapies: NA    Kelsey Hanna   PMHNP-BC 08/30/2014, 3:08 PM
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
## 2014-08-30 NOTE — ED Notes (Signed)
Patient continues to be awake, shuffling around room.  She won't allow staff to discard any of her cups, though she has seven on her bedside table.  She becomes irritable when staff asks her to put a gown on

## 2014-08-30 NOTE — Consult Note (Signed)
[facility] Consult   Reason for Consult: Depressed, suicidal ideation, manic  Referring Physician:  EDP Patient Identification: DELPHA PERKO MRN:  [id number] Principal Diagnosis: Bipolar I disorder, most recent episode (or current) unspecified Diagnosis:   Patient Active Problem List   Diagnosis Date Noted  . PNA (pneumonia) [J18.9] 04/27/2014  . Type 2 diabetes mellitus [E11.9] 04/27/2014  . Essential hypertension [I10] 04/27/2014  . Hypothyroidism [E03.9] 04/27/2014  . Tobacco abuse [Z72.0] 04/27/2014  . Bipolar I disorder, most recent episode (or current) unspecified [F31.9] 04/20/2014  . Acute encephalopathy [G93.40] 04/18/2014  . Unresponsiveness [R40.4]   . Acute respiratory failure [J96.00]   . Encounter for feeding tube placement [Z87.898]   . Rapid atrial fibrillation [I48.91]   . Shortness of breath [R06.02]     Total Time spent with patient: 25 minutes  Subjective:   ELLORIE KINDALL is a 64 y.o. female patient admitted with depression and manic behavior.  On 08/28/14, pt seen and chart reviewed with this NP and Dr. Darleene Cleaver. Pt continues to present as manic, tangential, and unstable. Pt reports that she ran out of 7 of her 14 meds about 4 months ago and that she feels this has contributed to her decline. Pt denies suicidal and homicidal ideation but is clearly manic; warrants inpatient admission.   HPI:  This patient is a 64 year old white female with history of bipolar disorder. She states that she lives in an retirement community in her own home. She claims that her family doctor change a lot of her medications but there is no evidence for this in the record. She states she hasn't slept for days that she is very depressed and is thinking about dying. She does not have any specific plan to kill herself but feels that she would be better off dead. She states that she has no purpose for living. She also claims that her thoughts are racing.  In  reviewing the old records indicates that she is supposed to be on Depakote and Seroquel. She was hospitalized in March and saw Dr. Christinia Gully in consultation and these medications were continued then. She did not receive outpatient follow-up until last week when she went to [facility] and Depakote was reinitiated. She is very anxious today and still feels quite depressed.  Reviewed above note with update from today.  Patient remains disorganized but pleasant at times.  Patient is compliant with her medications. We will continue with our plan of care, seeking placement at a gero-psychiatric unit.  Today patient denies SI/HI/AVH.  She is eating, drinking and sleeping well.  Patient is at [place] at this time.  She is alert and oriented x 3.  Patient states that she does have home healthcare staff for 2 hours a day for 7 days.   Patient is discharged home now. HPI Elements:   Location:  global. Quality:  severe. Severity:  severe. Timing:  several weeks. Duration:  years. Context:  medication noncompliance.  Past Medical History:  Past Medical History  Diagnosis Date  . Bipolar 1 disorder   . Hypothyroid   . Dyslipidemia   . Retinal detachment   . Sleep apnea   . Diabetes mellitus     dx within last yr....just takes pills  . Cancer     cancer of uterus...no chemo or radiation    Past Surgical History  Procedure Laterality Date  . Abdominal hysterectomy    . Total abdominal hysterectomy w/ bilateral salpingoophorectomy    . Tonsillectomy    .  Lumbar laminectomy/decompression microdiscectomy  07/17/2011    Procedure: LUMBAR LAMINECTOMY/DECOMPRESSION MICRODISCECTOMY;  Surgeon: Sinclair Ship, MD;  Location: [place];  Service: Orthopedics;  Laterality: Left;  Left sided lumbar 4-5 microdisectomy   Family History: No family history on file. Social History:  History  Alcohol Use No    Comment: socially     History  Drug Use No    History   Social History  . Marital Status:  Divorced    Spouse Name: N/A  . Number of Children: N/A  . Years of Education: N/A   Social History Main Topics  . Smoking status: Former Smoker -- 2.00 packs/day for 35 years    Types: Cigarettes    Quit date: 07/09/1999  . Smokeless tobacco: Not on file  . Alcohol Use: No     Comment: socially  . Drug Use: No  . Sexual Activity: Not on file   Other Topics Concern  . None   Social History Narrative   Additional Social History:                          Allergies:   Allergies  Allergen Reactions  . Lithium Other (See Comments)    "makes me feel spaced out" slurred speech    Labs:  Results for orders placed or performed during the hospital encounter of 08/27/14 (from the past 48 hour(s))  CBG monitoring, ED     Status: None   Collection Time: 08/29/14  8:53 AM  Result Value Ref Range   Glucose-Capillary 75 65 - 99 mg/dL  CBG monitoring, ED     Status: None   Collection Time: 08/29/14  5:27 PM  Result Value Ref Range   Glucose-Capillary 85 65 - 99 mg/dL  CBG monitoring, ED     Status: Abnormal   Collection Time: 08/30/14  2:24 AM  Result Value Ref Range   Glucose-Capillary 106 (H) 65 - 99 mg/dL    Vitals: Blood pressure 126/78, pulse 109, temperature 98.1 F (36.7 C), temperature source Oral, resp. rate 18, height 5\' 3"  (1.6 m), SpO2 98 %.  Risk to Self: Suicidal Ideation: No Suicidal Intent: No-Not Currently/Within Last 6 Months Is patient at risk for suicide?: No Suicidal Plan?: No Access to Means: No What has been your use of drugs/alcohol within the last 12 months?: Patient denied How many times?: 1 Other Self Harm Risks: No Triggers for Past Attempts: Other (Comment) (Living situation and mental health issues) Intentional Self Injurious Behavior: None Risk to Others: Homicidal Ideation: No Thoughts of Harm to Others: No Current Homicidal Intent: No Current Homicidal Plan: No Access to Homicidal Means: No Identified Victim: N/A History of  harm to others?: No Assessment of Violence: None Noted Violent Behavior Description: N/A Does patient have access to weapons?: No Criminal Charges Pending?: No Does patient have a court date: No Prior Inpatient Therapy: Prior Inpatient Therapy: Yes Prior Therapy Dates: March 2016 Prior Therapy Facilty/Provider(s): [facility] Reason for Treatment: Overdose Prior Outpatient Therapy: Prior Outpatient Therapy: Yes Prior Therapy Dates: Current Prior Therapy Facilty/Provider(s): [facility] Reason for Treatment: Bipolar Does patient have an ACCT team?: No Does patient have Intensive In-House Services?  : No Does patient have [facility] services? : Yes Does patient have P4CC services?: No  Current Facility-Administered Medications  Medication Dose Route Frequency Provider Last Rate Last Dose  . acetaminophen (TYLENOL) tablet 650 mg  650 mg Oral Q4H PRN Varney Biles, MD      .  aspirin EC tablet 325 mg  325 mg Oral Daily Varney Biles, MD   325 mg at 08/30/14 0814  . cyclobenzaprine (FLEXERIL) tablet 10 mg  10 mg Oral TID PRN Ernestina Patches, MD   10 mg at 08/30/14 1329  . divalproex (DEPAKOTE ER) 24 hr tablet 500 mg  500 mg Oral QHS Cloria Spring, MD   500 mg at 08/29/14 2113  . fluticasone (FLONASE) 50 MCG/ACT nasal spray 2 spray  2 spray Each Nare BID Quintella Reichert, MD   2 spray at 08/30/14 [phone]  . lisinopril (PRINIVIL,ZESTRIL) tablet 5 mg  5 mg Oral Daily Varney Biles, MD   5 mg at 08/29/14 0948  . LORazepam (ATIVAN) tablet 1 mg  1 mg Oral Q8H PRN Varney Biles, MD   1 mg at 08/30/14 1329  . metFORMIN (GLUCOPHAGE) tablet 500 mg  500 mg Oral BID WC Varney Biles, MD   500 mg at 08/29/14 0749  . metoprolol tartrate (LOPRESSOR) tablet 50 mg  50 mg Oral BID WC Varney Biles, MD   50 mg at 08/28/14 1804  . morphine (MSIR) tablet 15 mg  15 mg Oral QID PRN Ernestina Patches, MD   15 mg at 08/30/14 0813  . ondansetron (ZOFRAN) tablet 4 mg  4 mg Oral Q8H PRN Ankit Nanavati, MD      . QUEtiapine (SEROQUEL)  tablet 100 mg  100 mg Oral BID Varney Biles, MD   100 mg at 08/30/14 0814  . zolpidem (AMBIEN) tablet 5 mg  5 mg Oral QHS PRN Varney Biles, MD   5 mg at 08/28/14 2346   Current Outpatient Prescriptions  Medication Sig Dispense Refill  . acetic acid-hydrocortisone (VOSOL-HC) otic solution Place 3 drops into the left ear daily as needed. For dizziness  0  . atorvastatin (LIPITOR) 40 MG tablet Take 40 mg by mouth every evening.     . colchicine 0.6 MG tablet Take 0.6 mg by mouth daily.    . divalproex (DEPAKOTE) 500 MG DR tablet Take 500 mg by mouth.     . fluticasone (FLONASE) 50 MCG/ACT nasal spray Place 2 sprays into both nostrils 2 (two) times daily.    Marland Kitchen levothyroxine (SYNTHROID, LEVOTHROID) 137 MCG tablet Take 137 mcg by mouth daily before breakfast.    . meclizine (ANTIVERT) 12.5 MG tablet Take 1 tablet (12.5 mg total) by mouth 3 (three) times daily as needed for dizziness. 20 tablet 0  . metFORMIN (GLUCOPHAGE) 500 MG tablet Take 500 mg by mouth 2 (two) times daily with a meal.    . morphine (MSIR) 15 MG tablet Take 1 tablet by mouth 4 (four) times daily as needed (for pain.).     Marland Kitchen oxyCODONE-acetaminophen (PERCOCET) 10-325 MG per tablet Take 1 tablet by mouth 4 (four) times daily as needed. For pain.    . potassium chloride (K-DUR) 10 MEQ tablet Take 2 tablets (20 mEq total) by mouth daily. 60 tablet 0  . QUEtiapine (SEROQUEL) 100 MG tablet Take 1 tablet (100 mg total) by mouth 2 (two) times daily. 60 tablet 0  . VOLTAREN 1 % GEL Apply 1 application topically daily.    Marland Kitchen aspirin EC 325 MG EC tablet Take 1 tablet (325 mg total) by mouth daily. (Patient not taking: Reported on 08/27/2014) 30 tablet 0  . cyclobenzaprine (FLEXERIL) 10 MG tablet Take 1 tablet (10 mg total) by mouth 3 (three) times daily as needed for muscle spasms. 30 tablet 0  . gabapentin (NEURONTIN) 300 MG capsule  Take 300 mg by mouth 2 (two) times daily.     Marland Kitchen lisinopril (PRINIVIL,ZESTRIL) 5 MG tablet Take 1 tablet (5 mg  total) by mouth daily. (Patient not taking: Reported on 07/04/2014) 30 tablet 0  . metoprolol (LOPRESSOR) 50 MG tablet Take 1 tablet (50 mg total) by mouth 2 (two) times daily with a meal. (Patient not taking: Reported on 08/27/2014) 60 tablet 0  . mupirocin ointment (BACTROBAN) 2 % Place 1 application into the nose 2 (two) times daily. (Patient not taking: Reported on 07/04/2014) 22 g 0    Musculoskeletal: Strength & Muscle Tone: within normal limits Gait & Station: normal Patient leans: N/A  Psychiatric Specialty Exam: Physical Exam  Review of Systems  Psychiatric/Behavioral: Positive for depression and hallucinations (manic at this time). Negative for suicidal ideas. The patient is nervous/anxious and has insomnia.   All other systems reviewed and are negative.   Blood pressure 126/78, pulse 109, temperature 98.1 F (36.7 C), temperature source Oral, resp. rate 18, height 5\' 3"  (1.6 m), SpO2 98 %.There is no weight on file to calculate BMI.  General Appearance: Casual and Fairly Groomed  Engineer, water::  Good  Speech:  Clear and Coherent and Normal Rate  Volume:  Normal  Mood:  Anxious  Affect:  Congruent  Thought Process:  Coherent  Orientation:  Full (Time, Place, and Person)  Thought Content:  Obsessions  Suicidal Thoughts:  No  Homicidal Thoughts:  No  Memory:  Immediate;   Good Recent;   Fair Remote;   Fair  Judgement:  Poor  Insight:  Fair  Psychomotor Activity:  Normal  Concentration:  Fair  Recall:  [employer] of Knowledge:Fair  Language: Good  Akathisia:  No  Handed:  Right  AIMS (if indicated):     Assets:  Communication Skills Desire for Improvement Social Support  ADL's:  Impaired  Cognition: WNL  Sleep:   poor   Medical Decision Making: Established Problem, Stable/Improving (1)  Disposition: Discharge home  Delfin Gant, PMHNP-BC 08/30/2014 2:32 PM  Patient seen face-to-face for psychiatric evaluation, chart reviewed and case discussed with the  physician extender and developed treatment plan. Reviewed the information documented and agree with the treatment plan. Corena Pilgrim, MD

## 2014-09-03 ENCOUNTER — Emergency Department (HOSPITAL_COMMUNITY)
Admission: EM | Admit: 2014-09-03 | Discharge: 2014-09-05 | Disposition: A | Discharge status: Other Acute Inpatient Hospital | Payer: Medicare Other | Attending: Emergency Medicine | Admitting: Emergency Medicine

## 2014-09-03 ENCOUNTER — Emergency Department (HOSPITAL_COMMUNITY): Payer: Medicare Other

## 2014-09-03 ENCOUNTER — Encounter (HOSPITAL_COMMUNITY): Payer: Self-pay | Admitting: Emergency Medicine

## 2014-09-03 DIAGNOSIS — Z79899 Other long term (current) drug therapy: Secondary | ICD-10-CM | POA: Diagnosis not present

## 2014-09-03 DIAGNOSIS — F3110 Bipolar disorder, current episode manic without psychotic features, unspecified: Secondary | ICD-10-CM | POA: Diagnosis not present

## 2014-09-03 DIAGNOSIS — Z8542 Personal history of malignant neoplasm of other parts of uterus: Secondary | ICD-10-CM | POA: Insufficient documentation

## 2014-09-03 DIAGNOSIS — Z049 Encounter for examination and observation for unspecified reason: Secondary | ICD-10-CM

## 2014-09-03 DIAGNOSIS — Z7951 Long term (current) use of inhaled steroids: Secondary | ICD-10-CM | POA: Insufficient documentation

## 2014-09-03 DIAGNOSIS — Z87891 Personal history of nicotine dependence: Secondary | ICD-10-CM | POA: Insufficient documentation

## 2014-09-03 DIAGNOSIS — E785 Hyperlipidemia, unspecified: Secondary | ICD-10-CM | POA: Insufficient documentation

## 2014-09-03 DIAGNOSIS — E039 Hypothyroidism, unspecified: Secondary | ICD-10-CM | POA: Diagnosis not present

## 2014-09-03 DIAGNOSIS — Z791 Long term (current) use of non-steroidal anti-inflammatories (NSAID): Secondary | ICD-10-CM | POA: Insufficient documentation

## 2014-09-03 DIAGNOSIS — F309 Manic episode, unspecified: Secondary | ICD-10-CM | POA: Diagnosis present

## 2014-09-03 DIAGNOSIS — E119 Type 2 diabetes mellitus without complications: Secondary | ICD-10-CM | POA: Insufficient documentation

## 2014-09-03 DIAGNOSIS — Z8669 Personal history of other diseases of the nervous system and sense organs: Secondary | ICD-10-CM | POA: Diagnosis not present

## 2014-09-03 DIAGNOSIS — Z9114 Patient's other noncompliance with medication regimen: Secondary | ICD-10-CM | POA: Diagnosis not present

## 2014-09-03 DIAGNOSIS — G4700 Insomnia, unspecified: Secondary | ICD-10-CM

## 2014-09-03 DIAGNOSIS — Z7982 Long term (current) use of aspirin: Secondary | ICD-10-CM | POA: Insufficient documentation

## 2014-09-03 DIAGNOSIS — R45851 Suicidal ideations: Secondary | ICD-10-CM | POA: Diagnosis not present

## 2014-09-03 LAB — CBG MONITORING, ED: Glucose-Capillary: 77 mg/dL (ref 65–99)

## 2014-09-03 MED ORDER — LORAZEPAM 2 MG/ML IJ SOLN
1.0000 mg | Freq: Once | INTRAMUSCULAR | Status: AC
  Administered 2014-09-03: 1 mg via INTRAMUSCULAR

## 2014-09-03 MED ORDER — POTASSIUM CHLORIDE ER 10 MEQ PO TBCR
20.0000 meq | EXTENDED_RELEASE_TABLET | Freq: Every day | ORAL | Status: DC
  Administered 2014-09-04 – 2014-09-05 (×2): 20 meq via ORAL
  Filled 2014-09-03 (×3): qty 2

## 2014-09-03 MED ORDER — MORPHINE SULFATE 15 MG PO TABS: 15.0000 mg | ORAL_TABLET | Freq: Four times a day (QID) | ORAL | Status: DC | PRN

## 2014-09-03 MED ORDER — COLCHICINE 0.6 MG PO TABS
0.6000 mg | ORAL_TABLET | Freq: Every day | ORAL | Status: DC
  Administered 2014-09-04 – 2014-09-05 (×2): 0.6 mg via ORAL
  Filled 2014-09-03 (×2): qty 1

## 2014-09-03 MED ORDER — MORPHINE SULFATE 15 MG PO TABS
15.0000 mg | ORAL_TABLET | ORAL | Status: DC | PRN
  Administered 2014-09-03 – 2014-09-04 (×3): 15 mg via ORAL
  Filled 2014-09-03 (×3): qty 1

## 2014-09-03 MED ORDER — MECLIZINE HCL 25 MG PO TABS: 12.5000 mg | ORAL_TABLET | Freq: Three times a day (TID) | ORAL | Status: DC | PRN

## 2014-09-03 MED ORDER — LORAZEPAM 0.5 MG PO TABS
1.0000 mg | ORAL_TABLET | Freq: Once | ORAL | Status: DC
  Filled 2014-09-03: qty 1

## 2014-09-03 MED ORDER — ATORVASTATIN CALCIUM 40 MG PO TABS
40.0000 mg | ORAL_TABLET | Freq: Every evening | ORAL | Status: DC
  Administered 2014-09-03 – 2014-09-05 (×3): 40 mg via ORAL
  Filled 2014-09-03 (×3): qty 1

## 2014-09-03 MED ORDER — QUETIAPINE FUMARATE 100 MG PO TABS
100.0000 mg | ORAL_TABLET | Freq: Two times a day (BID) | ORAL | Status: DC
  Administered 2014-09-03 – 2014-09-05 (×4): 100 mg via ORAL
  Filled 2014-09-03 (×5): qty 1

## 2014-09-03 MED ORDER — DIVALPROEX SODIUM ER 500 MG PO TB24
500.0000 mg | ORAL_TABLET | Freq: Every day | ORAL | Status: DC
  Administered 2014-09-03 – 2014-09-04 (×2): 500 mg via ORAL
  Filled 2014-09-03 (×3): qty 1

## 2014-09-03 MED ORDER — HALOPERIDOL LACTATE 5 MG/ML IJ SOLN
5.0000 mg | Freq: Once | INTRAMUSCULAR | Status: AC
  Administered 2014-09-03: 5 mg via INTRAMUSCULAR
  Filled 2014-09-03: qty 1

## 2014-09-03 MED ORDER — FLUTICASONE PROPIONATE 50 MCG/ACT NA SUSP
2.0000 | Freq: Two times a day (BID) | NASAL | Status: DC
  Administered 2014-09-03 – 2014-09-05 (×4): 2 via NASAL
  Filled 2014-09-03: qty 16

## 2014-09-03 MED ORDER — LEVOTHYROXINE SODIUM 137 MCG PO TABS
137.0000 ug | ORAL_TABLET | Freq: Every day | ORAL | Status: DC
  Administered 2014-09-04 – 2014-09-05 (×2): 137 ug via ORAL
  Filled 2014-09-03 (×3): qty 1

## 2014-09-03 MED ORDER — LISINOPRIL 5 MG PO TABS
5.0000 mg | ORAL_TABLET | Freq: Every day | ORAL | Status: DC
  Administered 2014-09-04 – 2014-09-05 (×2): 5 mg via ORAL
  Filled 2014-09-03 (×2): qty 1

## 2014-09-03 MED ORDER — METOPROLOL TARTRATE 25 MG PO TABS
50.0000 mg | ORAL_TABLET | Freq: Two times a day (BID) | ORAL | Status: DC
  Administered 2014-09-04 – 2014-09-05 (×3): 50 mg via ORAL
  Filled 2014-09-03 (×5): qty 2

## 2014-09-03 MED ORDER — LORAZEPAM 2 MG/ML IJ SOLN
INTRAMUSCULAR | Status: AC
  Filled 2014-09-03: qty 1

## 2014-09-03 MED ORDER — METFORMIN HCL 500 MG PO TABS
500.0000 mg | ORAL_TABLET | Freq: Two times a day (BID) | ORAL | Status: DC
  Filled 2014-09-03 (×6): qty 1

## 2014-09-03 MED ORDER — ASPIRIN EC 325 MG PO TBEC
325.0000 mg | DELAYED_RELEASE_TABLET | Freq: Every day | ORAL | Status: DC
  Administered 2014-09-04 – 2014-09-05 (×2): 325 mg via ORAL
  Filled 2014-09-03 (×2): qty 1
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
## 2014-09-03 NOTE — Progress Notes (Signed)
Seeking inpatient geriatric psychiatric placement for pt:  Referrals made: [place] per Nunzio Cory- will need to fax EKG results once complete, currently ordered [facility]- per Maudie Mercury  All other gero-psych facilities contacted ([place], [place], [place], [place], [place], [place], [facility], [place], [place], [place], [place], [place], [place]) at capacity for females.   Sharren Bridge, MSW, [place] [facility] Social Work, Disposition  09/03/2014 [phone]

## 2014-09-03 NOTE — ED Notes (Signed)
Report received from [place]. Pt. Alert in no distress denies SI, HI, AVH and pain.  Pt. Instructed to come to me with problems or concerns.Will continue to monitor for safety via security cameras and Q 15 minute checks.

## 2014-09-03 NOTE — BH Assessment (Signed)
[facility] Assessment Progress Note   Clinician called Alvina Chou, PA regarding need for TTS.  Patient reports not being able to sleep for 6 weeks.  Pt has tunnel vision, complains of arm pain from picking up dead animals.    Clinician requested to have teleassessment cart brought out and patient placed in conference room.  Was informed by nurse that Kriste Basque was doing face to face assessment of patient.

## 2014-09-03 NOTE — ED Notes (Signed)
Pt has been up wondering around, found in bathroom cleaning toilet,  Pt has had sheet tied around neck says she is trying to make a sling.
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
## 2014-09-03 NOTE — ED Notes (Signed)
Bed: [facility] Expected date:  Expected time:  Means of arrival:  Comments: Hold tcu 28
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
## 2014-09-03 NOTE — ED Notes (Addendum)
Pt began accusing sitter of various complaints such as not setting up her meal which sitter had done upon meal arrival. Pt had bacon crumbs in bed from when she was eating breakfast earlier. Pt became verbally abusive to sitter. Spoke with MD and PO ativan ordered. Charge nurse spoke with pt and security at bedside. Pt refused PO ativan because she said she was nauseous from getting upset. Changed order to IM. Gave pt medication. Returned side rails to upright position after medication administration. Pt then accused RN of trying to hurt her hand and pt began have a threatening posture in addition to continuing be verbally abusive.

## 2014-09-03 NOTE — BH Assessment (Signed)
Assessment completed. Consulted Darlyne Russian, PA-C who recommended inpatient treatment. TTS will contact other facilities for placement.

## 2014-09-03 NOTE — ED Notes (Signed)
Pt. Noted in hall. No complaints or concerns voiced. No distress or abnormal behavior noted. Will continue to monitor with security cameras. Q 15 minute rounds continue.

## 2014-09-03 NOTE — ED Notes (Signed)
Pt states she needs an arm sling,  She picks up dead animals and makes a pet cemetary, she hasn't slept in 6 weeks, talks about childhood and riding a merry go round and vomiting,  Pt is very irritable and demanding.

## 2014-09-03 NOTE — ED Notes (Signed)
Pt. Noted in room. No complaints or concerns voiced. No distress or abnormal behavior noted. Will continue to monitor with security cameras. Q 15 minute rounds continue. 

## 2014-09-03 NOTE — ED Notes (Signed)
Pt reports that she walks with a walker normally and will need assistance to get up to walk to the bathroom. EDP charge notified.  Per charge patient has been up and ambulatory w/o assistance and has not required assistance.  Pt ambulatory from TCU w/o assistance.

## 2014-09-03 NOTE — ED Notes (Signed)
Pt arrived to the ED with a complaint of being unable to sleep.  Pt states it has been six weeks since she has slept.  Pt has multiple complaints to include shoulder injury, cellulitis, hypotension and pain.  Pt was on ambien but she is not taking it anymore.
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
## 2014-09-03 NOTE — ED Notes (Signed)
Pt. Noted sleeping in room. No complaints or concerns voiced. No distress or abnormal behavior noted. Will continue to monitor with security cameras. Q 15 minute rounds continue. 

## 2014-09-04 DIAGNOSIS — R45851 Suicidal ideations: Secondary | ICD-10-CM

## 2014-09-04 MED ORDER — CYCLOBENZAPRINE HCL 10 MG PO TABS
10.0000 mg | ORAL_TABLET | Freq: Three times a day (TID) | ORAL | Status: DC | PRN
Start: 2014-09-04 — End: 2014-09-05
  Administered 2014-09-04 – 2014-09-05 (×3): 10 mg via ORAL
  Filled 2014-09-04 (×3): qty 1

## 2014-09-04 NOTE — ED Notes (Addendum)
Pt resting at present, no distress noted, watching TV at present.  Monitoring for safety, Q 15 min checks in effect. Pt ambulatory intermittently about the floor.

## 2014-09-04 NOTE — ED Notes (Signed)
Pt. Noted sleeping in room. No complaints or concerns voiced. No distress or abnormal behavior noted. Will continue to monitor with security cameras. Q 15 minute rounds continue. 

## 2014-09-04 NOTE — ED Notes (Signed)
Pt. Noted in room. No complaints or concerns voiced. No distress or abnormal behavior noted. Will continue to monitor with security cameras. Q 15 minute rounds continue. 
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
## 2014-09-04 NOTE — BH Assessment (Addendum)
[facility] Assessment Progress Note  The following facilities have been contacted to seek placement for this pt, with results as noted:  Beds available, information sent, decision pending:  [facility]   At capacity:  [facility] [place], [place] Triage Specialist [phone]

## 2014-09-04 NOTE — ED Notes (Signed)
Pt. Noted in room. No complaints or concerns voiced. No acute distress noted. Will continue to monitor with security cameras. Q 15 minute rounds continue.

## 2014-09-04 NOTE — ED Notes (Signed)
Pt is alert and oriented.  Denies SI, HI, and AVH.  She is very manic and wanting a lot of attention.  She has written a list of phone numbers from [employer] and wants the nurses to call them.  She is pleasant and cooperative.  Denies SI, HI, and AVH.  15 minute checks in place.

## 2014-09-05 ENCOUNTER — Encounter (HOSPITAL_COMMUNITY): Payer: Self-pay | Admitting: *Deleted

## 2014-09-05 ENCOUNTER — Inpatient Hospital Stay (HOSPITAL_COMMUNITY)
Admission: AD | Admit: 2014-09-05 | Discharge: 2014-09-12 | DRG: 885 | Disposition: A | Discharge status: Home / Self Care | Payer: 59 | Source: Intra-hospital | Attending: Psychiatry | Admitting: Psychiatry

## 2014-09-05 DIAGNOSIS — E039 Hypothyroidism, unspecified: Secondary | ICD-10-CM | POA: Diagnosis present

## 2014-09-05 DIAGNOSIS — F4310 Post-traumatic stress disorder, unspecified: Secondary | ICD-10-CM | POA: Diagnosis present

## 2014-09-05 DIAGNOSIS — G8929 Other chronic pain: Secondary | ICD-10-CM | POA: Diagnosis present

## 2014-09-05 DIAGNOSIS — I4891 Unspecified atrial fibrillation: Secondary | ICD-10-CM | POA: Diagnosis present

## 2014-09-05 DIAGNOSIS — F17210 Nicotine dependence, cigarettes, uncomplicated: Secondary | ICD-10-CM | POA: Diagnosis present

## 2014-09-05 DIAGNOSIS — E119 Type 2 diabetes mellitus without complications: Secondary | ICD-10-CM | POA: Diagnosis present

## 2014-09-05 DIAGNOSIS — F3113 Bipolar disorder, current episode manic without psychotic features, severe: Secondary | ICD-10-CM | POA: Diagnosis not present

## 2014-09-05 DIAGNOSIS — I10 Essential (primary) hypertension: Secondary | ICD-10-CM | POA: Diagnosis present

## 2014-09-05 DIAGNOSIS — F3110 Bipolar disorder, current episode manic without psychotic features, unspecified: Principal | ICD-10-CM | POA: Diagnosis present

## 2014-09-05 DIAGNOSIS — F419 Anxiety disorder, unspecified: Secondary | ICD-10-CM | POA: Diagnosis present

## 2014-09-05 DIAGNOSIS — F312 Bipolar disorder, current episode manic severe with psychotic features: Secondary | ICD-10-CM | POA: Diagnosis present

## 2014-09-05 DIAGNOSIS — G4700 Insomnia, unspecified: Secondary | ICD-10-CM | POA: Diagnosis present

## 2014-09-05 DIAGNOSIS — E780 Pure hypercholesterolemia: Secondary | ICD-10-CM | POA: Diagnosis present

## 2014-09-05 DIAGNOSIS — F319 Bipolar disorder, unspecified: Secondary | ICD-10-CM | POA: Diagnosis present

## 2014-09-05 DIAGNOSIS — E785 Hyperlipidemia, unspecified: Secondary | ICD-10-CM | POA: Diagnosis present

## 2014-09-05 DIAGNOSIS — M1990 Unspecified osteoarthritis, unspecified site: Secondary | ICD-10-CM | POA: Diagnosis present

## 2014-09-05 DIAGNOSIS — E038 Other specified hypothyroidism: Secondary | ICD-10-CM | POA: Diagnosis not present

## 2014-09-05 DIAGNOSIS — R600 Localized edema: Secondary | ICD-10-CM | POA: Diagnosis not present

## 2014-09-05 DIAGNOSIS — E034 Atrophy of thyroid (acquired): Secondary | ICD-10-CM | POA: Diagnosis not present

## 2014-09-05 DIAGNOSIS — Z720 Tobacco use: Secondary | ICD-10-CM | POA: Diagnosis present

## 2014-09-05 LAB — GLUCOSE, CAPILLARY: Glucose-Capillary: 110 mg/dL — ABNORMAL HIGH (ref 65–99)

## 2014-09-05 MED ORDER — LISINOPRIL 5 MG PO TABS: 5.0000 mg | ORAL_TABLET | Freq: Every day | ORAL | Status: DC

## 2014-09-05 MED ORDER — MECLIZINE HCL 12.5 MG PO TABS
12.5000 mg | ORAL_TABLET | Freq: Three times a day (TID) | ORAL | Status: DC | PRN
  Filled 2014-09-05: qty 1

## 2014-09-05 MED ORDER — ATORVASTATIN CALCIUM 40 MG PO TABS
40.0000 mg | ORAL_TABLET | Freq: Every evening | ORAL | Status: DC
  Administered 2014-09-06 – 2014-09-11 (×6): 40 mg via ORAL
  Filled 2014-09-05 (×9): qty 1

## 2014-09-05 MED ORDER — DIVALPROEX SODIUM ER 500 MG PO TB24: 500.0000 mg | ORAL_TABLET | Freq: Every day | ORAL | Status: DC

## 2014-09-05 MED ORDER — COLCHICINE 0.6 MG PO TABS: 0.6000 mg | ORAL_TABLET | Freq: Every day | ORAL | Status: DC

## 2014-09-05 MED ORDER — ALUM & MAG HYDROXIDE-SIMETH 200-200-20 MG/5ML PO SUSP
30.0000 mL | ORAL | Status: DC | PRN
Start: 2014-09-05 — End: 2014-09-12

## 2014-09-05 MED ORDER — MAGNESIUM HYDROXIDE 400 MG/5ML PO SUSP
30.0000 mL | Freq: Every day | ORAL | Status: DC | PRN
  Administered 2014-09-12: 30 mL via ORAL
  Filled 2014-09-05: qty 30

## 2014-09-05 MED ORDER — METFORMIN HCL 500 MG PO TABS: 500.0000 mg | ORAL_TABLET | Freq: Two times a day (BID) | ORAL | Status: DC

## 2014-09-05 MED ORDER — ATORVASTATIN CALCIUM 40 MG PO TABS: 40.0000 mg | ORAL_TABLET | Freq: Every evening | ORAL | Status: DC

## 2014-09-05 MED ORDER — QUETIAPINE FUMARATE 100 MG PO TABS: 100.0000 mg | ORAL_TABLET | Freq: Two times a day (BID) | ORAL | Status: DC

## 2014-09-05 MED ORDER — LEVOTHYROXINE SODIUM 137 MCG PO TABS: 137.0000 ug | ORAL_TABLET | Freq: Every day | ORAL | Status: DC

## 2014-09-05 MED ORDER — LEVOTHYROXINE SODIUM 137 MCG PO TABS
137.0000 ug | ORAL_TABLET | Freq: Every day | ORAL | Status: DC
  Administered 2014-09-06: 137 ug via ORAL
  Filled 2014-09-05 (×3): qty 1

## 2014-09-05 MED ORDER — QUETIAPINE FUMARATE 100 MG PO TABS
100.0000 mg | ORAL_TABLET | Freq: Two times a day (BID) | ORAL | Status: DC
  Administered 2014-09-05 – 2014-09-06 (×3): 100 mg via ORAL
  Filled 2014-09-05 (×7): qty 1

## 2014-09-05 MED ORDER — FLUTICASONE PROPIONATE 50 MCG/ACT NA SUSP
2.0000 | Freq: Two times a day (BID) | NASAL | Status: DC
  Administered 2014-09-06 – 2014-09-12 (×12): 2 via NASAL
  Filled 2014-09-05: qty 16

## 2014-09-05 MED ORDER — METOPROLOL TARTRATE 50 MG PO TABS: 50.0000 mg | ORAL_TABLET | Freq: Two times a day (BID) | ORAL | Status: DC

## 2014-09-05 MED ORDER — DIVALPROEX SODIUM ER 500 MG PO TB24
500.0000 mg | ORAL_TABLET | Freq: Every day | ORAL | Status: DC
  Administered 2014-09-05 – 2014-09-06 (×2): 500 mg via ORAL
  Filled 2014-09-05 (×5): qty 1

## 2014-09-05 MED ORDER — ZOLPIDEM TARTRATE 5 MG PO TABS
5.0000 mg | ORAL_TABLET | Freq: Once | ORAL | Status: AC
  Administered 2014-09-05: 5 mg via ORAL
  Filled 2014-09-05: qty 1

## 2014-09-05 MED ORDER — NICOTINE POLACRILEX 2 MG MT GUM: 2.0000 mg | CHEWING_GUM | OROMUCOSAL | Status: DC | PRN

## 2014-09-05 MED ORDER — POTASSIUM CHLORIDE ER 10 MEQ PO TBCR: 20.0000 meq | EXTENDED_RELEASE_TABLET | Freq: Every day | ORAL | Status: DC

## 2014-09-05 MED ORDER — DICLOFENAC SODIUM 1 % TD GEL
2.0000 g | Freq: Every day | TRANSDERMAL | Status: DC
  Administered 2014-09-06 – 2014-09-12 (×7): 2 g via TOPICAL
  Filled 2014-09-05 (×2): qty 100

## 2014-09-05 MED ORDER — IBUPROFEN 200 MG PO TABS
600.0000 mg | ORAL_TABLET | Freq: Four times a day (QID) | ORAL | Status: DC | PRN
  Administered 2014-09-05: 600 mg via ORAL
  Filled 2014-09-05: qty 3

## 2014-09-05 MED ORDER — FLUTICASONE PROPIONATE 50 MCG/ACT NA SUSP: 2.0000 | Freq: Two times a day (BID) | NASAL | Status: DC

## 2014-09-05 MED ORDER — CYCLOBENZAPRINE HCL 10 MG PO TABS
10.0000 mg | ORAL_TABLET | Freq: Three times a day (TID) | ORAL | Status: DC | PRN
  Administered 2014-09-05 – 2014-09-12 (×18): 10 mg via ORAL
  Filled 2014-09-05: qty 1
  Filled 2014-09-05 (×15): qty 2

## 2014-09-05 MED ORDER — COLCHICINE 0.6 MG PO TABS
0.6000 mg | ORAL_TABLET | Freq: Every day | ORAL | Status: DC
  Administered 2014-09-06 – 2014-09-12 (×7): 0.6 mg via ORAL
  Filled 2014-09-05 (×9): qty 1

## 2014-09-05 MED ORDER — ASPIRIN EC 325 MG PO TBEC
325.0000 mg | DELAYED_RELEASE_TABLET | Freq: Every day | ORAL | Status: DC
  Administered 2014-09-06 – 2014-09-12 (×7): 325 mg via ORAL
  Filled 2014-09-05 (×9): qty 1

## 2014-09-05 MED ORDER — POTASSIUM CHLORIDE ER 10 MEQ PO TBCR
20.0000 meq | EXTENDED_RELEASE_TABLET | Freq: Every day | ORAL | Status: DC
  Administered 2014-09-06 – 2014-09-12 (×7): 20 meq via ORAL
  Filled 2014-09-05 (×9): qty 2

## 2014-09-05 MED ORDER — METFORMIN HCL 500 MG PO TABS
500.0000 mg | ORAL_TABLET | Freq: Two times a day (BID) | ORAL | Status: DC
  Administered 2014-09-07 – 2014-09-08 (×3): 500 mg via ORAL
  Filled 2014-09-05 (×14): qty 1

## 2014-09-05 MED ORDER — ACETAMINOPHEN 325 MG PO TABS: 650.0000 mg | ORAL_TABLET | Freq: Four times a day (QID) | ORAL | Status: DC | PRN

## 2014-09-05 MED ORDER — CYCLOBENZAPRINE HCL 10 MG PO TABS: 10.0000 mg | ORAL_TABLET | Freq: Three times a day (TID) | ORAL | Status: DC | PRN

## 2014-09-05 MED ORDER — ALUM & MAG HYDROXIDE-SIMETH 200-200-20 MG/5ML PO SUSP: 30.0000 mL | ORAL | Status: DC | PRN

## 2014-09-05 MED ORDER — ACETAMINOPHEN 325 MG PO TABS
650.0000 mg | ORAL_TABLET | Freq: Four times a day (QID) | ORAL | Status: DC | PRN
  Filled 2014-09-05: qty 2

## 2014-09-05 MED ORDER — TRAZODONE HCL 50 MG PO TABS
50.0000 mg | ORAL_TABLET | Freq: Every evening | ORAL | Status: DC | PRN
Start: 2014-09-05 — End: 2014-09-06
  Administered 2014-09-05: 50 mg via ORAL
  Filled 2014-09-05 (×7): qty 1

## 2014-09-05 MED ORDER — NICOTINE 7 MG/24HR TD PT24
7.0000 mg | MEDICATED_PATCH | Freq: Once | TRANSDERMAL | Status: DC
  Administered 2014-09-05: 7 mg via TRANSDERMAL
  Filled 2014-09-05: qty 1

## 2014-09-05 MED ORDER — ASPIRIN EC 325 MG PO TBEC: 325.0000 mg | DELAYED_RELEASE_TABLET | Freq: Every day | ORAL | Status: DC

## 2014-09-05 MED ORDER — LORAZEPAM 1 MG PO TABS
1.0000 mg | ORAL_TABLET | Freq: Once | ORAL | Status: AC
  Administered 2014-09-05: 1 mg via ORAL
  Filled 2014-09-05: qty 1

## 2014-09-05 MED ORDER — INSULIN ASPART 100 UNIT/ML ~~LOC~~ SOLN: 0.0000 [IU] | Freq: Every day | SUBCUTANEOUS | Status: DC

## 2014-09-05 MED ORDER — LISINOPRIL 5 MG PO TABS
5.0000 mg | ORAL_TABLET | Freq: Every day | ORAL | Status: DC
  Administered 2014-09-06 – 2014-09-11 (×5): 5 mg via ORAL
  Filled 2014-09-05 (×11): qty 1

## 2014-09-05 MED ORDER — MAGNESIUM HYDROXIDE 400 MG/5ML PO SUSP: 30.0000 mL | Freq: Every day | ORAL | Status: DC | PRN

## 2014-09-05 MED ORDER — INSULIN ASPART 100 UNIT/ML ~~LOC~~ SOLN
0.0000 [IU] | Freq: Three times a day (TID) | SUBCUTANEOUS | Status: DC
  Administered 2014-09-06: 7 [IU] via SUBCUTANEOUS
  Administered 2014-09-06: 3 [IU] via SUBCUTANEOUS

## 2014-09-05 MED ORDER — IBUPROFEN 600 MG PO TABS
600.0000 mg | ORAL_TABLET | Freq: Four times a day (QID) | ORAL | Status: DC | PRN
  Administered 2014-09-05 – 2014-09-12 (×5): 600 mg via ORAL
  Filled 2014-09-05 (×5): qty 1

## 2014-09-05 MED ORDER — METOPROLOL TARTRATE 50 MG PO TABS
50.0000 mg | ORAL_TABLET | Freq: Two times a day (BID) | ORAL | Status: DC
  Administered 2014-09-06 – 2014-09-08 (×5): 50 mg via ORAL
  Filled 2014-09-05 (×13): qty 1

## 2014-09-05 NOTE — ED Notes (Signed)
Pt is hyperactive,  Asking for many items and hoarding them in her room.  She denies SI, HI, and AVH.  She has multiple medical complaints including constipation and shoulder and ankle pain which she reports as chronic. She doesn't appear to be in pain as she is up mingling in milieu.  15 minute checks remain in place.

## 2014-09-05 NOTE — Progress Notes (Signed)
64 year old female pt admitted on voluntary basis. Pt reports that she came into the hospital because she is unable to sleep. Pt reports that she has a PCP but currently looking for a new one as her current one will not prescribe her anything for sleep. Pt also reports various medical issues and cites problems with her ribs, her feet, her left eye and reports that she is in pain constantly. The pain prevents her from sleeping and this is causing her to have these suicidal thoughts. Pt reports that she did go to [facility] but they closed her case as she had not been there in the past 3 years so she presented as a walk-in recently and said she was prescribed medication but does not recall what it was. Pt also reports she did have a CNA check on her but it's been 6 weeks since that last happened and also she is working on getting an ACT team. Pt reports that she does not have a good support person but reports that she wants to get a condo that is close to her daughter when she gets discharged from here. Pt does report some passive SI on admission due in large part to her medical issues but able to contract for safety on the unit. Pt was oriented to unit and safety maintained.

## 2014-09-05 NOTE — ED Notes (Signed)
Patient appears calm, cooperative. Denies SI, HI, AVH. Reports having chronic pain.   Encouragement offered. Environment adjusted.  Q 15 safety checks continue.

## 2014-09-05 NOTE — Tx Team (Signed)
Initial Interdisciplinary Treatment Plan   PATIENT STRESSORS: Health problems   PATIENT STRENGTHS: Ability for insight Average or above average intelligence Communication skills General fund of knowledge Motivation for treatment/growth   PROBLEM LIST: Problem List/Patient Goals Date to be addressed Date deferred Reason deferred Estimated date of resolution  Depression 09/05/14     Suicidal Ideation 09/05/14     insomnia 09/05/14     "I wish to be in heaven" 09/05/14     "I don't like my existence" 09/05/14     "I want to feel like my old self" 09/05/14                        DISCHARGE CRITERIA:  Ability to meet basic life and health needs Improved stabilization in mood, thinking, and/or behavior Verbal commitment to aftercare and medication compliance  PRELIMINARY DISCHARGE PLAN: Attend aftercare/continuing care group  PATIENT/FAMIILY INVOLVEMENT: This treatment plan has been presented to and reviewed with the patient, RAND BOLLER, and/or family member, .  The patient and family have been given the opportunity to ask questions and make suggestions.  Hecker, Wink 09/05/2014, 9:34 PM

## 2014-09-05 NOTE — Consult Note (Signed)
[facility] Consult   Reason for Consult:  Mania Referring Physician:  EDP Patient Identification: Kelsey Hanna MRN:  [id number] Principal Diagnosis: Bipolar affective disorder, manic Diagnosis:   Patient Active Problem List   Diagnosis Date Noted  . Bipolar affective disorder, manic [F31.10] 09/03/2014    Priority: High  . Encounter for preadmission testing [Z01.818]   . PNA (pneumonia) [J18.9] 04/27/2014  . Type 2 diabetes mellitus [E11.9] 04/27/2014  . Essential hypertension [I10] 04/27/2014  . Hypothyroidism [E03.9] 04/27/2014  . Tobacco abuse [Z72.0] 04/27/2014  . Bipolar I disorder, most recent episode (or current) unspecified [F31.9] 04/20/2014  . Acute encephalopathy [G93.40] 04/18/2014  . Unresponsiveness [R40.4]   . Acute respiratory failure [J96.00]   . Encounter for feeding tube placement [Z87.898]   . Rapid atrial fibrillation [I48.91]   . Shortness of breath [R06.02]     Total Time spent with patient: 30 minutes  Subjective:   Kelsey Hanna is a 64 y.o. female patient admitted with mania.  HPI:  The patient remains manic with pressured speech and tangential though process.  Intrusive at times with frequent staff interruptions.  Pleasant and cooperative. HPI Elements:   Location:  generalized. Quality:  acute. Severity:  severe. Timing:  constant. Duration:  few days. Context:  medication adjustment.  Past Medical History:  Past Medical History  Diagnosis Date  . Bipolar 1 disorder   . Hypothyroid   . Dyslipidemia   . Retinal detachment   . Sleep apnea   . Diabetes mellitus     dx within last yr....just takes pills  . Cancer     cancer of uterus...no chemo or radiation    Past Surgical History  Procedure Laterality Date  . Abdominal hysterectomy    . Total abdominal hysterectomy w/ bilateral salpingoophorectomy    . Tonsillectomy    . Lumbar laminectomy/decompression microdiscectomy  07/17/2011    Procedure: LUMBAR  LAMINECTOMY/DECOMPRESSION MICRODISCECTOMY;  Surgeon: Sinclair Ship, MD;  Location: [facility];  Service: Orthopedics;  Laterality: Left;  Left sided lumbar 4-5 microdisectomy   Family History: History reviewed. No pertinent family history. Social History:  History  Alcohol Use No    Comment: socially     History  Drug Use No    History   Social History  . Marital Status: Divorced    Spouse Name: N/A  . Number of Children: N/A  . Years of Education: N/A   Social History Main Topics  . Smoking status: Former Smoker -- 2.00 packs/day for 35 years    Types: Cigarettes    Quit date: 07/09/1999  . Smokeless tobacco: Not on file  . Alcohol Use: No     Comment: socially  . Drug Use: No  . Sexual Activity: Not on file   Other Topics Concern  . None   Social History Narrative   Additional Social History:    History of alcohol / drug use?: No history of alcohol / drug abuse                     Allergies:   Allergies  Allergen Reactions  . Lithium Other (See Comments)    "makes me feel spaced out" slurred speech    Labs: No results found for this or any previous visit (from the past 48 hour(s)).  Vitals: Blood pressure 146/81, pulse 78, temperature 98.9 F (37.2 C), temperature source Oral, resp. rate 16, SpO2 98 %.  Risk to Self: Suicidal Ideation: No Suicidal Intent:  No Is patient at risk for suicide?: No Suicidal Plan?: No Access to Means: No What has been your use of drugs/alcohol within the last 12 months?: No drugs/alcohol use reported.  How many times?: 1 Other Self Harm Risks: No self harm risk identified.  Triggers for Past Attempts: Other (Comment) (Living situation and mental health issues) Intentional Self Injurious Behavior: None Risk to Others: Homicidal Ideation: No Thoughts of Harm to Others: No Current Homicidal Intent: No Current Homicidal Plan: No Access to Homicidal Means: No Identified Victim: N/A History of harm to others?:  No Assessment of Violence: None Noted Violent Behavior Description: No violent behaviors observed. Pt is calm and cooperative.  Does patient have access to weapons?: No Criminal Charges Pending?: No Does patient have a court date: No Prior Inpatient Therapy: Prior Inpatient Therapy: Yes Prior Therapy Dates: 2007, 2008 Prior Therapy Facilty/Provider(s): [facility] Reason for Treatment: Overdose Prior Outpatient Therapy: Prior Outpatient Therapy: Yes Prior Therapy Dates: Current Prior Therapy Facilty/Provider(s): [facility] Reason for Treatment: Bipolar Does patient have an ACCT team?: No Does patient have Intensive In-House Services?  : No Does patient have [facility] services? : Yes Does patient have [facility] services?: No  Current Facility-Administered Medications  Medication Dose Route Frequency Provider Last Rate Last Dose  . aspirin EC tablet 325 mg  325 mg Oral Daily Virgel Manifold, MD   325 mg at 09/05/14 1001  . atorvastatin (LIPITOR) tablet 40 mg  40 mg Oral QPM Virgel Manifold, MD   40 mg at 09/04/14 1822  . colchicine tablet 0.6 mg  0.6 mg Oral Daily Virgel Manifold, MD   0.6 mg at 09/05/14 1002  . cyclobenzaprine (FLEXERIL) tablet 10 mg  10 mg Oral TID PRN Noemi Chapel, MD   10 mg at 09/05/14 1554  . divalproex (DEPAKOTE ER) 24 hr tablet 500 mg  500 mg Oral QHS Virgel Manifold, MD   500 mg at 09/04/14 2159  . fluticasone (FLONASE) 50 MCG/ACT nasal spray 2 spray  2 spray Each Nare BID Virgel Manifold, MD   2 spray at 09/05/14 1001  . ibuprofen (ADVIL,MOTRIN) tablet 600 mg  600 mg Oral Q6H PRN Patrecia Pour, NP   600 mg at 09/05/14 1554  . levothyroxine (SYNTHROID, LEVOTHROID) tablet 137 mcg  137 mcg Oral QAC breakfast Virgel Manifold, MD   137 mcg at 09/05/14 0746  . lisinopril (PRINIVIL,ZESTRIL) tablet 5 mg  5 mg Oral Daily Virgel Manifold, MD   5 mg at 09/05/14 1002  . LORazepam (ATIVAN) tablet 1 mg  1 mg Oral Once Virgel Manifold, MD   1 mg at 09/03/14 0947  . meclizine (ANTIVERT) tablet 12.5 mg  12.5  mg Oral TID PRN Virgel Manifold, MD      . metFORMIN (GLUCOPHAGE) tablet 500 mg  500 mg Oral BID WC Virgel Manifold, MD   500 mg at 09/03/14 1812  . metoprolol tartrate (LOPRESSOR) tablet 50 mg  50 mg Oral BID WC Silvana Holecek   50 mg at 09/05/14 0746  . morphine (MSIR) tablet 15 mg  15 mg Oral Q4H PRN Janayia Burggraf   15 mg at 09/04/14 2200  . nicotine (NICODERM CQ - dosed in mg/24 hr) patch 7 mg  7 mg Transdermal Once Patrecia Pour, NP   7 mg at 09/05/14 1415  . potassium chloride (K-DUR) CR tablet 20 mEq  20 mEq Oral Daily Virgel Manifold, MD   20 mEq at 09/05/14 1002  . QUEtiapine (SEROQUEL) tablet 100 mg  100 mg Oral  BID Virgel Manifold, MD   100 mg at 09/05/14 1002   Current Outpatient Prescriptions  Medication Sig Dispense Refill  . acetic acid-hydrocortisone (VOSOL-HC) otic solution Place 3 drops into the left ear daily as needed. For dizziness  0  . aspirin EC 325 MG EC tablet Take 1 tablet (325 mg total) by mouth daily. 30 tablet 0  . atorvastatin (LIPITOR) 40 MG tablet Take 40 mg by mouth every evening.     . colchicine 0.6 MG tablet Take 0.6 mg by mouth daily.    . cyclobenzaprine (FLEXERIL) 10 MG tablet Take 1 tablet (10 mg total) by mouth 3 (three) times daily as needed for muscle spasms. 30 tablet 0  . divalproex (DEPAKOTE ER) 500 MG 24 hr tablet Take 1 tablet (500 mg total) by mouth at bedtime. 30 tablet 0  . fluticasone (FLONASE) 50 MCG/ACT nasal spray Place 2 sprays into both nostrils 2 (two) times daily.    Marland Kitchen levothyroxine (SYNTHROID, LEVOTHROID) 137 MCG tablet Take 137 mcg by mouth daily before breakfast.    . lisinopril (PRINIVIL,ZESTRIL) 5 MG tablet Take 1 tablet (5 mg total) by mouth daily. 30 tablet 0  . meclizine (ANTIVERT) 12.5 MG tablet Take 1 tablet (12.5 mg total) by mouth 3 (three) times daily as needed for dizziness. 20 tablet 0  . Melatonin 3 MG CAPS Take 6 mg by mouth at bedtime.    . metFORMIN (GLUCOPHAGE) 500 MG tablet Take 500 mg by mouth 2 (two) times daily with  a meal.    . metoprolol (LOPRESSOR) 50 MG tablet Take 1 tablet (50 mg total) by mouth 2 (two) times daily with a meal. 60 tablet 0  . morphine (MSIR) 15 MG tablet Take 1 tablet by mouth 4 (four) times daily as needed for moderate pain (for pain.).     Marland Kitchen oxyCODONE-acetaminophen (PERCOCET) 10-325 MG per tablet Take 1 tablet by mouth 4 (four) times daily as needed for pain. For pain.    . potassium chloride (K-DUR) 10 MEQ tablet Take 2 tablets (20 mEq total) by mouth daily. 60 tablet 0  . QUEtiapine (SEROQUEL) 100 MG tablet Take 1 tablet (100 mg total) by mouth 2 (two) times daily. 60 tablet 0  . VOLTAREN 1 % GEL Apply 1 application topically daily.      Musculoskeletal: Strength & Muscle Tone: within normal limits Gait & Station: normal Patient leans: N/A  Psychiatric Specialty Exam: Physical Exam  Review of Systems  Constitutional: Negative.   HENT: Negative.   Eyes: Negative.   Respiratory: Negative.   Cardiovascular: Negative.   Gastrointestinal: Negative.   Genitourinary: Negative.   Musculoskeletal: Negative.   Skin: Negative.   Neurological: Negative.   Endo/Heme/Allergies: Negative.   Psychiatric/Behavioral: The patient is nervous/anxious.        Manic behaviors    Blood pressure 146/81, pulse 78, temperature 98.9 F (37.2 C), temperature source Oral, resp. rate 16, SpO2 98 %.There is no weight on file to calculate BMI.  General Appearance: Casual  Eye Contact::  Fair  Speech:  Pressured  Volume:  Normal  Mood:  Euphoric  Affect:  Congruent  Thought Process:  Tangential  Orientation:  Full (Time, Place, and Person)  Thought Content:  WDL  Suicidal Thoughts:  No  Homicidal Thoughts:  No  Memory:  Immediate;   Fair Recent;   Fair Remote;   Fair  Judgement:  Impaired  Insight:  Lacking  Psychomotor Activity:  Increased  Concentration:  Poor  Recall:  Fair  Fund of Knowledge:Good  Language: Good  Akathisia:  No  Handed:  Right  AIMS (if indicated):      Assets:  Housing Leisure Time Resilience Social Support  ADL's:  Intact  Cognition: WNL  Sleep:      Medical Decision Making: Review of Psycho-Social Stressors (1) and Review of Medication Regimen & Side Effects (2)  Treatment Plan Summary: Bipolar Affective Disorder, current episode, manic Daily contact with patient to assess and evaluate symptoms and progress in treatment, Medication management and Plan continue current medications, admit to [facility] for stabilization  Plan:  Recommend psychiatric Inpatient admission when medically cleared. Disposition: Transfer to [facility]  Waylan Boga, [place] 09/05/2014 4:38 PM Patient seen face-to-face for psychiatric evaluation, chart reviewed and case discussed with the physician extender and developed treatment plan. Reviewed the information documented and agree with the treatment plan. Corena Pilgrim, MD

## 2014-09-06 DIAGNOSIS — F3113 Bipolar disorder, current episode manic without psychotic features, severe: Secondary | ICD-10-CM

## 2014-09-06 LAB — HEPATIC FUNCTION PANEL
ALT: 30 U/L (ref 14–54)
AST: 21 U/L (ref 15–41)
Albumin: 4.1 g/dL (ref 3.5–5.0)
Alkaline Phosphatase: 68 U/L (ref 38–126)
BILIRUBIN TOTAL: 0.4 mg/dL (ref 0.3–1.2)
Bilirubin, Direct: <0.1 mg/dL — ABNORMAL LOW (ref 0.1–0.5)
Total Protein: 6.8 g/dL (ref 6.5–8.1)

## 2014-09-06 LAB — GLUCOSE, CAPILLARY
GLUCOSE-CAPILLARY: 114 mg/dL — AB (ref 65–99)
Glucose-Capillary: 203 mg/dL — ABNORMAL HIGH (ref 65–99)
Glucose-Capillary: 138 mg/dL — ABNORMAL HIGH (ref 65–99)
Glucose-Capillary: 92 mg/dL (ref 65–99)

## 2014-09-06 LAB — LIPID PANEL
Cholesterol: 113 mg/dL (ref 0–200)
HDL: 52 mg/dL (ref >40)
LDL CALC: 38 mg/dL (ref 0–99)
TRIGLYCERIDES: 117 mg/dL (ref <150)
Total CHOL/HDL Ratio: 2.2 RATIO
VLDL: 23 mg/dL (ref 0–40)

## 2014-09-06 MED ORDER — QUETIAPINE FUMARATE 50 MG PO TABS
150.0000 mg | ORAL_TABLET | Freq: Every day | ORAL | Status: DC
  Administered 2014-09-06: 150 mg via ORAL
  Filled 2014-09-06 (×3): qty 1

## 2014-09-06 MED ORDER — LORAZEPAM 0.5 MG PO TABS
0.5000 mg | ORAL_TABLET | Freq: Four times a day (QID) | ORAL | Status: DC | PRN
  Administered 2014-09-06 – 2014-09-09 (×3): 0.5 mg via ORAL
  Filled 2014-09-06 (×4): qty 1
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
## 2014-09-06 NOTE — BHH Group Notes (Signed)
[facility] Mental Health Association Group Therapy  09/06/2014 , 1:29 PM    Type of Therapy:  Mental Health Association Presentation  Participation Level:  Active  Participation Quality:  Attentive  Affect:  Blunted  Cognitive:  Oriented  Insight:  Limited  Engagement in Therapy:  Engaged  Modes of Intervention:  Discussion, Education and Socialization  Summary of Progress/Problems:  Shanon Brow from [place] came to present his recovery story and play the guitar.  Came in and out several times during group.  Missed large sections.  Returned at the end and engaged with the speaker.  Roque Lias B 09/06/2014 , 1:29 PM

## 2014-09-06 NOTE — Progress Notes (Signed)
Pt was up most of the night   She looked like she was sleepy but she said she wasn't sleepy   She kept walking down the hall saying I cant sleep   As of present time pt has finally fallen asleep   She is intrusive has pressured speech

## 2014-09-06 NOTE — Clinical Social Work Note (Signed)
Patient has been referred to [facility] for ACTT services, referral has been sent to [place] from internal source at [facility] - will come to [facility] tomorrow for admission to ACT program w [name].  Edwyna Shell, LCSW Clinical Social Worker

## 2014-09-06 NOTE — Clinical Social Work Note (Signed)
Per daughter, patient has morphine in apartment, medications filled at [facility], did not give name of prescriber.  Concerned that patient may be abusing morphine.  Edwyna Shell, LCSW Clinical Social Worker

## 2014-09-06 NOTE — BHH Suicide Risk Assessment (Signed)
[facility] Admission Suicide Risk Assessment   Nursing information obtained from:    Demographic factors:   64 year old woman, lives alone , on disability Current Mental Status:   see below  Loss Factors:   poor social support system  Historical Factors:   history of bipolar disorder  Risk Reduction Factors:   resilience  Total Time spent with patient: 45 minutes Principal Problem: Bipolar affective disorder, current episode manic without psychotic symptoms Diagnosis:   Patient Active Problem List   Diagnosis Date Noted  . Bipolar affective disorder, current episode manic without psychotic symptoms [F31.10] 09/05/2014  . Bipolar affective disorder, current episode manic with psychotic symptoms [F31.2] 09/05/2014  . Bipolar affective disorder, manic [F31.10] 09/03/2014  . Encounter for preadmission testing [Z01.818]   . PNA (pneumonia) [J18.9] 04/27/2014  . Type 2 diabetes mellitus [E11.9] 04/27/2014  . Essential hypertension [I10] 04/27/2014  . Hypothyroidism [E03.9] 04/27/2014  . Tobacco abuse [Z72.0] 04/27/2014  . Bipolar I disorder, most recent episode (or current) unspecified [F31.9] 04/20/2014  . Acute encephalopathy [G93.40] 04/18/2014  . Unresponsiveness [R40.4]   . Acute respiratory failure [J96.00]   . Encounter for feeding tube placement [Z87.898]   . Rapid atrial fibrillation [I48.91]   . Shortness of breath [R06.02]      Continued Clinical Symptoms:  Alcohol Use Disorder Identification Test Final Score (AUDIT): 1 The "Alcohol Use Disorders Identification Test", Guidelines for Use in Primary Care, Second Edition.  World Pharmacologist [facility]). Score between 0-7:  no or low risk or alcohol related problems. Score between 8-15:  moderate risk of alcohol related problems. Score between 16-19:  high risk of alcohol related problems. Score 20 or above:  warrants further diagnostic evaluation for alcohol dependence and treatment.   CLINICAL FACTORS:  Patient is a 64 year  old female, lives alone, and states she has very little social support, due to which she mostly stays inside her house .  She has a history of Bipolar Disorder, and states " I am always manic, I am pretty up there right now". She presents with pressured speech, is jovial, and dances/ imitates [employer],  and jokes throughout the session. She states she has " hardly slept" for several days. She reports decreased PO intacke and significant weight loss. Denies alcohol/drug abuse. 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 Denies falls .  She has a history of " doing very well on Depakote", but had been off it for several months, until she restarted it a few days ago. She also has  A history of good response to Lithium in the past. She is not presenting with psychotic symptoms, denies hallucinaitons, no delusions expressed .  Dx- Bipolar Disorder , Manic   Plan-  On Depakote ER 500 mgrs QHS- obtain VAlproic Acid Serum level. On Seroquel- will change to 150 mgrs QHS, consider restarting LiC03, as she reports history of good response to this medication. Obtain Hospitalist consultation to address possible hyperthyroidism.  Musculoskeletal: Strength & Muscle Tone: within normal limits Gait & Station: normal Patient leans: N/A  Psychiatric Specialty Exam: Physical Exam  ROS  Blood pressure 142/79, pulse 81, temperature 97.9 F (36.6 C), temperature source Oral, resp. rate 16, height 5\' 1"  (1.549 m), weight 165 lb (74.844 kg).Body mass index is 31.19 kg/(m^2).  General Appearance: Fairly Groomed  Engineer, water::  Good  Speech:  Pressured  Volume:  Normal  Mood:  Euphoric and expansive   Affect:  expansive, jovial  Thought Process:  no flight of ideations , but does describe subjective sense of racing thoughts  Orientation:  Full (Time, Place, and Person)  Thought Content:  denies hallucinations, no delusions  Suicidal Thoughts:  No  Homicidal Thoughts:   No  Memory:  recent and remote grossly intact   Judgement:  Fair  Insight:  Fair  Psychomotor Activity:  Increased  Concentration:  Good  Recall:  Good  Fund of Knowledge:Good  Language: Good  Akathisia:  Negative  Handed:  Right  AIMS (if indicated):     Assets:  Desire for Improvement Resilience  Sleep:  Number of Hours: 0.75  Cognition: WNL  ADL's:  Impaired     COGNITIVE FEATURES THAT CONTRIBUTE TO RISK:  Closed-mindedness and Loss of executive function    SUICIDE RISK:   Moderate:  Frequent suicidal ideation with limited intensity, and duration, some specificity in terms of plans, no associated intent, good self-control, limited dysphoria/symptomatology, some risk factors present, and identifiable protective factors, including available and accessible social support.  PLAN OF CARE: Patient will be admitted to inpatient psychiatric unit for stabilization and safety. Will provide and encourage milieu participation. Provide medication management and maked adjustments as needed.  Will follow daily.      I certify that inpatient services furnished can reasonably be expected to improve the patient's condition.   COBOS, FERNANDO 09/06/2014, 7:09 PM

## 2014-09-06 NOTE — BHH Suicide Risk Assessment (Signed)
[facility] INPATIENT:  Family/Significant Other Suicide Prevention Education  Suicide Prevention Education:  Education Completed; Kelsey Hanna (son) [phone],  (name of family member/significant other) has been identified by the patient as the family member/significant other with whom the patient will be residing, and identified as the person(s) who will aid the patient in the event of a mental health crisis (suicidal ideations/suicide attempt).  With written consent from the patient, the family member/significant other has been provided the following suicide prevention education, prior to the and/or following the discharge of the patient.  The suicide prevention education provided includes the following:  Suicide risk factors  Suicide prevention and interventions  National Suicide Hotline telephone number  [facility] assessment telephone number  [facility] Emergency Assistance [place] and/or Residential Mobile Crisis Unit telephone number  Request made of family/significant other to:  Remove weapons (e.g., guns, rifles, knives), all items previously/currently identified as safety concern.    Remove drugs/medications (over-the-counter, prescriptions, illicit drugs), all items previously/currently identified as a safety concern.  The family member/significant other verbalizes understanding of the suicide prevention education information provided.  The family member/significant other agrees to remove the items of safety concern listed above.  Son expressed concern about patient overusing prescribed pain medications and possible access to unprescribed morphine.  Patient was hospitalized in March 2016 after being found unresponsive at home, MDs think that patient took unintentional overdose of pain medications in attempt to treat her chronic pain. .   Patient does not have access to firearms in home per son.  Beverely Pace 09/06/2014, 4:37 PM

## 2014-09-06 NOTE — Tx Team (Addendum)
Interdisciplinary Treatment Plan Update (Adult)  Date:  09/06/2014   Time Reviewed:  8:39 AM   Progress in Treatment: Attending groups: Yes. Participating in groups:  Yes. Taking medication as prescribed:  Yes. Tolerating medication:  Yes. Family/Significant othe contact made:  Yes Patient understands diagnosis:  No Limited insight Discussing patient identified problems/goals with staff:  Yes, see initial care plan. Medical problems stabilized or resolved:  Yes. Denies suicidal/homicidal ideation: Yes. Issues/concerns per patient self-inventory:  No. Other:  New problem(s) identified:  Discharge Plan or Barriers: return home, follow up outpt  Reason for Continuation of Hospitalization: Mania Medication stabilization  Comments:  64 year old female pt admitted on voluntary basis. Pt reports that she came into the hospital because she is unable to sleep. Pt reports that she has a PCP but currently looking for a new one as her current one will not prescribe her anything for sleep. Pt also reports various medical issues and cites problems with her ribs, her feet, her left eye and reports that she is in pain constantly. The pain prevents her from sleeping and this is causing her to have these suicidal thoughts. Pt reports that she did go to [facility] but they closed her case as she had not been there in the past 3 years so she presented as a walk-in recently and said she was prescribed medication but does not recall what it was. Pt also reports she did have a CNA check on her but it's been 6 weeks since that last happened and also she is working on getting an ACT team. Pt reports that she does not have a good support person but reports that she wants to get a condo that is close to her daughter when she gets discharged from here. Pt does report some passive SI on admission due in large part to her medical issues but able to contract for safety on the unit.  Depakote, Seroquel trial  Estimated  length of stay: 4-5 days  New goal(s):  Review of initial/current patient goals per problem list:     Attendees: Patient:  09/06/2014 8:39 AM   Family:   09/06/2014 8:39 AM   Physician:  Jake Bathe, MD 09/06/2014 8:39 AM   Nursing:   Eben Burow, RN 09/06/2014 8:39 AM   CSW:    Roque Lias, LCSW   09/06/2014 8:39 AM   Other:  09/06/2014 8:39 AM   Other:   09/06/2014 8:39 AM   Other:  Lars Pinks, Nurse CM 09/06/2014 8:39 AM   Other:  Lucinda Dell, [facility] 09/06/2014 8:39 AM   Other:  Norberto Sorenson, [place]  09/06/2014 8:39 AM   Other:  09/06/2014 8:39 AM   Other:  09/06/2014 8:39 AM   Other:  09/06/2014 8:39 AM   Other:  09/06/2014 8:39 AM   Other:  09/06/2014 8:39 AM   Other:   09/06/2014 8:39 AM    Scribe for Treatment Team:   Trish Mage, 09/06/2014 8:39 AM

## 2014-09-06 NOTE — Progress Notes (Signed)
D    Pt has been intrusive and demanding   She has loose boundaries and requested a massage this evening    She needs frequent redirection  A   Verbal support given   Medications administered and effectiveness monitored   Q 15 min checks  R   Pt safe at present

## 2014-09-06 NOTE — Clinical Social Work Note (Signed)
CSW spoke w son, Neria Procter [phone]).  Says sister has done the most for mother and has most information.  When on her medications, patient is usually "pretty quiet", been diagnosed w bipolar disorder, has history of this throughout her life, medication seems to "level everything out."  Pt goes through periods of time when medications are "off" - patient is stubborn, has "baby voice."  Has been to MDs re broken ankle, has had multiple somatic complaints.   Home health aide was discontinued approx 4 months ago - feels that patient may have been "demanding" and may have had unrealistic expectations of aide and complained about care received.  Per son, daughter is working on getting home health aide reinstated.  Son says daughter is very supportive but also making sure patient is doing all she can for herself.  Patient has refused help from daughter in past when medications need adjustment, feels that patient will allow contact w daughter when medications are adjusted.  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  Has been married and divorced 5 times during her lifetime.  Family is concerned about patient having access to medications, including morphine, patient was hospitalized in March 2016 due to potential accidental overdose of pain medications.  States she is vision impaired and does not receives services for this.  Per record, SNF placement was recommended at last hospitalization and pt and daughter declined placement, desiring return to home and independence. Son and daughter are both supportive of mother but cannot manage medications for mother - want her to be able to do this herself.     Edwyna Shell, LCSW Clinical Social Worker
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
## 2014-09-06 NOTE — Clinical Social Work Note (Signed)
Patient is newly working w ACT Team and Herbie [facility] [phone]), previously did well w ACT Team in past. Has declined since that time, daughter took patient to walk in clinic and got services reinstated.  Hopeful that once stabilized, ACT services will be in place and CNA will also be in place.  Gets [employer] and [employer] for transportation.  PCP has been difficult as patient has not reliably kept appts in past due to lack of transport, has not had any reliable long term medical or psychiatric care since August 2016.  Daughter feels that once ACT team services are in place, patient will have what she needs to remain independent and return home.  Edwyna Shell, LCSW Clinical Social Worker

## 2014-09-07 DIAGNOSIS — E034 Atrophy of thyroid (acquired): Secondary | ICD-10-CM

## 2014-09-07 DIAGNOSIS — E038 Other specified hypothyroidism: Secondary | ICD-10-CM

## 2014-09-07 LAB — GLUCOSE, CAPILLARY
GLUCOSE-CAPILLARY: 115 mg/dL — AB (ref 65–99)
Glucose-Capillary: 87 mg/dL (ref 65–99)
Glucose-Capillary: 86 mg/dL (ref 65–99)
Glucose-Capillary: 157 mg/dL — ABNORMAL HIGH (ref 65–99)

## 2014-09-07 LAB — COMPREHENSIVE METABOLIC PANEL
ALBUMIN: 4.3 g/dL (ref 3.5–5.0)
ALK PHOS: 70 U/L (ref 38–126)
ALT: 33 U/L (ref 14–54)
AST: 27 U/L (ref 15–41)
Anion gap: 9 (ref 5–15)
BILIRUBIN TOTAL: 0.4 mg/dL (ref 0.3–1.2)
BUN: 12 mg/dL (ref 6–20)
CHLORIDE: 108 mmol/L (ref 101–111)
CO2: 24 mmol/L (ref 22–32)
Calcium: 9.7 mg/dL (ref 8.9–10.3)
Creatinine, Ser: 0.64 mg/dL (ref 0.44–1.00)
GFR calc Af Amer: >60 mL/min (ref >60)
GFR calc non Af Amer: >60 mL/min (ref >60)
Glucose, Bld: 115 mg/dL — ABNORMAL HIGH (ref 65–99)
Potassium: 4.1 mmol/L (ref 3.5–5.1)
SODIUM: 141 mmol/L (ref 135–145)
Total Protein: 7.2 g/dL (ref 6.5–8.1)

## 2014-09-07 LAB — CBC
HCT: 38.4 % (ref 36.0–46.0)
Hemoglobin: 12.6 g/dL (ref 12.0–15.0)
MCH: 30.4 pg (ref 26.0–34.0)
MCHC: 32.8 g/dL (ref 30.0–36.0)
MCV: 92.5 fL (ref 78.0–100.0)
PLATELETS: 317 10*3/uL (ref 150–400)
RBC: 4.15 MIL/uL (ref 3.87–5.11)
RDW: 13.1 % (ref 11.5–15.5)
WBC: 10.1 10*3/uL (ref 4.0–10.5)

## 2014-09-07 LAB — VALPROIC ACID LEVEL: Valproic Acid Lvl: 39 ug/mL — ABNORMAL LOW (ref 50.0–100.0)

## 2014-09-07 LAB — PROLACTIN: PROLACTIN: 11.8 ng/mL (ref 4.8–23.3)

## 2014-09-07 LAB — HEMOGLOBIN A1C
HEMOGLOBIN A1C: 5.9 % — AB (ref 4.8–5.6)
Mean Plasma Glucose: 123 mg/dL

## 2014-09-07 LAB — MAGNESIUM: MAGNESIUM: 1.9 mg/dL (ref 1.7–2.4)

## 2014-09-07 LAB — PHOSPHORUS: Phosphorus: 4 mg/dL (ref 2.5–4.6)

## 2014-09-07 MED ORDER — QUETIAPINE FUMARATE 200 MG PO TABS
200.0000 mg | ORAL_TABLET | Freq: Every day | ORAL | Status: DC
  Administered 2014-09-07 – 2014-09-11 (×5): 200 mg via ORAL
  Filled 2014-09-07 (×3): qty 1
  Filled 2014-09-07: qty 3
  Filled 2014-09-07 (×5): qty 1

## 2014-09-07 MED ORDER — LEVOTHYROXINE SODIUM 100 MCG IV SOLR
100.0000 ug | Freq: Every day | INTRAVENOUS | Status: DC
Start: 2014-09-07 — End: 2014-09-08
  Filled 2014-09-07 (×3): qty 5

## 2014-09-07 MED ORDER — DIVALPROEX SODIUM ER 500 MG PO TB24
750.0000 mg | ORAL_TABLET | Freq: Every day | ORAL | Status: DC
  Administered 2014-09-07 – 2014-09-10 (×4): 750 mg via ORAL
  Filled 2014-09-07 (×6): qty 1

## 2014-09-07 MED ORDER — TRAMADOL HCL 50 MG PO TABS
100.0000 mg | ORAL_TABLET | Freq: Four times a day (QID) | ORAL | Status: DC | PRN
  Administered 2014-09-07 – 2014-09-12 (×11): 100 mg via ORAL
  Filled 2014-09-07 (×13): qty 2

## 2014-09-07 MED ORDER — LORAZEPAM 0.5 MG PO TABS
0.5000 mg | ORAL_TABLET | Freq: Three times a day (TID) | ORAL | Status: DC
  Administered 2014-09-07 – 2014-09-11 (×13): 0.5 mg via ORAL
  Filled 2014-09-07 (×12): qty 1

## 2014-09-07 MED ORDER — ZOLPIDEM TARTRATE 5 MG PO TABS
5.0000 mg | ORAL_TABLET | Freq: Every evening | ORAL | Status: DC | PRN
  Administered 2014-09-08 – 2014-09-11 (×4): 5 mg via ORAL
  Filled 2014-09-07 (×4): qty 1

## 2014-09-07 NOTE — Progress Notes (Signed)
Kelsey Hanna is tolerating being in the hospital fairly well. Her manic behaviors are evidenced by  Her hoarding  In her room ( evidence of this is the towels, the kitchen plastic utensils, napkins, crackers, patient gowns and papers ) found in her room. Her emotional status cont to be fragile, consistent with flight of ideas, racing, pressured speech  And thought process.    A She has axis 2 tendencies that present themselves and she responds well to redirection and boundary - setting from staff . She will attempt to staff split.Marland Kitchenand seeks staff to staff to staff to find the answer she wants and that is acceptable to her.    R Safety is in place and poc cont.

## 2014-09-07 NOTE — Progress Notes (Signed)
The patient attended this evening's Karaoke group and was appropriate.  
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
## 2014-09-07 NOTE — Progress Notes (Addendum)
Pt has been in the hallway -Pt requested tramadol for pain all over. Pt was given this at 6:50p for pain a 10/10. Pt was also given flexeril earlier for muscle spasms. Pt was informed we need a urine specimen from her. Pt stated that her urine is cloudy and dark. She denies any burning or bleeding.

## 2014-09-07 NOTE — Consult Note (Signed)
[facility] Medical Consultation  Kelsey Hanna XBJ:[id number] DOB: 1950/03/13 DOA: 09/05/2014 PCP: Saralyn Pilar   Requesting physician: Jeris Penta Date of consultation: 09/07/14 Reason for consultation: Medical CoManagement of DM2/Hypothyroidism/afib/essential Htn  Assessment Kelsey Hanna is a pleasant 64 year old female with multiple medical problems including atrial fibrillation on aspirin/DM 2/essential hypertension/hypothyroidism/tobacco dependency/bipolar disorder, admitted with pressured speech felt to be due to a manic episode of her bipolar disorder and the hospitalists service is asked to see patient for medical management of DM 2/hypothyroidism/A. fib/essential hypertension/tobacco dependency, among other medical problems. She has multiple complaints including the fact that she has not taken some of her medications for the past 6 months as she says that her PCP did not give refills for some of the medications. However, she does not remember which medications she hasn't been taking for a while. In any case, her medical problems seem to be generally stable and review of her medical record shows abnormal thyroid function tests, tachycardia on and off, otherwise no other acute findings. TSH was 0.078 and free T4 2.10 earlier this month. Her hemoglobin A1c is 5.9. EKG was normal on 09/03/2014, so was a chest x-ray. We'll therefore recommend to resume all her home medications as you're doing but reduce the dose of Synthroid 100 g daily with plans to recheck thyroid function tests 6 weeks from now. Patient also complains of insomnia and chronic ankle pain. I have therefore added Ambien/Ultram as needed. Hanna await UA results, obtain CBC/CMP for further adjustments to her medications. Recommendations Rapid atrial fibrillation/Essential hypertension  Monitor rate on Lopressor, continue lisinopril and aspirin.   Patient not anticoagulated long-term, probably due to poor  compliance. Type 2 diabetes mellitus  Generally controlled  Continue metformin  CBC/CMP/UA Hypothyroidism  Reduce Synthroid to 100 g daily Tobacco abuse  Continue nicotine patch Bipolar affective disorder, current episode manic with psychotic symptoms  Deferred to psychiatry Insomnia/arthritis  Ambien  Ultram/colchicine  Thank you for this consultation. We Hanna followup again tomorrow. Please contact me if I can be of assistance in the meanwhile.   Chief Complaint  Medical management  HPI:  The hospitalist service was asked to see Kelsey Hanna by Dr. Parke Poisson for medical management, especially abnormal thyroid function tests of this patient who is being managed for for a manic episode of her bipolar disorder. She is a poor historian with his pressured speech but tells me that she hasn't taken some of her medications in the last 6 months "because her PCP didn't continue the medications", however, she does not remember which of the medications she has been taking-says she has been taking 7 out of the 14 regular medications. She is all over the place in terms of history but complains of ankle pain and insomnia. She continues to have pressured speech and flight of ideas. She denies chest pain, shortness of breath, diarrhea or dysuria. Her TSH was noted to be low at the leading of the month. Patient normally takes Synthroid 137 g daily.  Review of Systems:  As in the hospital course summary above.  Past Medical History  Diagnosis Date  . Bipolar 1 disorder   . Hypothyroid   . Dyslipidemia   . Retinal detachment   . Sleep apnea   . Diabetes mellitus     dx within last yr....just takes pills  . Cancer     cancer of uterus...no chemo or radiation   Past Surgical History  Procedure Laterality Date  . Abdominal hysterectomy    .  Total abdominal hysterectomy w/ bilateral salpingoophorectomy    . Tonsillectomy    . Lumbar laminectomy/decompression microdiscectomy  07/17/2011     Procedure: LUMBAR LAMINECTOMY/DECOMPRESSION MICRODISCECTOMY;  Surgeon: Sinclair Ship, MD;  Location: [facility];  Service: Orthopedics;  Laterality: Left;  Left sided lumbar 4-5 microdisectomy   Social History:  reports that she has been smoking Cigarettes.  She has a 8.75 pack-year smoking history. She does not have any smokeless tobacco history on file. She reports that she does not drink alcohol or use illicit drugs.  Allergies  Allergen Reactions  . Lithium Other (See Comments)    "makes me feel spaced out" slurred speech   History reviewed. No pertinent family history.  Prior to Admission medications   Medication Sig Start Date End Date Taking? Authorizing Provider  acetic acid-hydrocortisone (VOSOL-HC) otic solution Place 3 drops into the left ear daily as needed. For dizziness 07/27/14   Historical Provider, MD  aspirin EC 325 MG EC tablet Take 1 tablet (325 mg total) by mouth daily. 04/24/14   Florencia Reasons, MD  atorvastatin (LIPITOR) 40 MG tablet Take 40 mg by mouth every evening.     Historical Provider, MD  colchicine 0.6 MG tablet Take 0.6 mg by mouth daily.    Historical Provider, MD  cyclobenzaprine (FLEXERIL) 10 MG tablet Take 1 tablet (10 mg total) by mouth 3 (three) times daily as needed for muscle spasms. 04/24/14   Florencia Reasons, MD  divalproex (DEPAKOTE ER) 500 MG 24 hr tablet Take 1 tablet (500 mg total) by mouth at bedtime. 08/30/14   Delfin Gant, NP  fluticasone (FLONASE) 50 MCG/ACT nasal spray Place 2 sprays into both nostrils 2 (two) times daily. 08/11/14   Historical Provider, MD  levothyroxine (SYNTHROID, LEVOTHROID) 137 MCG tablet Take 137 mcg by mouth daily before breakfast.    Historical Provider, MD  lisinopril (PRINIVIL,ZESTRIL) 5 MG tablet Take 1 tablet (5 mg total) by mouth daily. 08/30/14   Delfin Gant, NP  meclizine (ANTIVERT) 12.5 MG tablet Take 1 tablet (12.5 mg total) by mouth 3 (three) times daily as needed for dizziness. 07/04/14   Wandra Arthurs, MD  Melatonin  3 MG CAPS Take 6 mg by mouth at bedtime.    Historical Provider, MD  metFORMIN (GLUCOPHAGE) 500 MG tablet Take 500 mg by mouth 2 (two) times daily with a meal.    Historical Provider, MD  metoprolol (LOPRESSOR) 50 MG tablet Take 1 tablet (50 mg total) by mouth 2 (two) times daily with a meal. 04/24/14   Florencia Reasons, MD  potassium chloride (K-DUR) 10 MEQ tablet Take 2 tablets (20 mEq total) by mouth daily. 04/24/14   Florencia Reasons, MD  QUEtiapine (SEROQUEL) 100 MG tablet Take 1 tablet (100 mg total) by mouth 2 (two) times daily. 04/24/14   Florencia Reasons, MD  VOLTAREN 1 % GEL Apply 1 application topically daily. 06/29/13   Historical Provider, MD   Physical Exam: Blood pressure 134/85, pulse 106, temperature 97.8 F (36.6 C), temperature source Oral, resp. rate 18, height 5\' 1"  (1.549 m), weight 74.844 kg (165 lb). Filed Vitals:   09/07/14 1619  BP: 134/85  Pulse: 106  Temp:   Resp:      General:  Comfortable at rest  Eyes: Patch left eye  ENT: Normal  Neck: No JVD  Cardiovascular: S1-S2 normal. No murmurs. RRR.  Respiratory: Good air entry bilaterally. No rhonchi or rails.  Abdomen: Soft and nontender. Normal bowel sounds. No organomegaly.  Skin:  Intact  Musculoskeletal: No pedal edema.  Psychiatric: Normal  Neurologic: Grossly intact.  Labs on Admission:  Basic Metabolic Panel: No results for input(s): NA, K, CL, CO2, GLUCOSE, BUN, CREATININE, CALCIUM, MG, PHOS in the last 168 hours. Liver Function Tests:  Recent Labs Lab 09/06/14 0630  AST 21  ALT 30  ALKPHOS 68  BILITOT 0.4  PROT 6.8  ALBUMIN 4.1   No results for input(s): LIPASE, AMYLASE in the last 168 hours. No results for input(s): AMMONIA in the last 168 hours. CBC: No results for input(s): WBC, NEUTROABS, HGB, HCT, MCV, PLT in the last 168 hours. Cardiac Enzymes: No results for input(s): CKTOTAL, CKMB, CKMBINDEX, TROPONINI in the last 168 hours. BNP: Invalid input(s): POCBNP CBG:  Recent Labs Lab 09/06/14 1704  09/06/14 2057 09/07/14 0602 09/07/14 1156 09/07/14 1648  GLUCAP 138* 92 115* 87 86    Radiological Exams on Admission: No results found.  EKG: Independently reviewed.   Time spent: 55 minutes  Loyed Wilmes [facility] Pager [phone]  If 7PM-7AM, please contact night-coverage [email or web address] Password [facility] 09/07/2014, 6:05 PM

## 2014-09-07 NOTE — Progress Notes (Addendum)
[facility] MD Progress Note  09/07/2014 3:00 PM Kelsey Hanna  MRN:  [id number] Subjective:   Patient states " I continue to feel manic ". She reports poor sleep. She states medications are well tolerated . Denies medication side effects. Objective : I have discussed case with treatment team and have met with patient. She remains manic, although today less pressured, less restless, and better organized .  She states that due to her living alone, her worsening eye sight, and her limited support network, " I need a lot of social services.I will talk to the social worker about that ".  She denies medication side effects at present. As discussed with staff, continues to be somewhat loud, intrusive, intermittently demanding, having difficulty with boundaries, but generally redirectable . We discussed lab findings, particularly suppressed TSH, elevated T4- patient does have a history of hypothyroidism , is prescribed Synthroid- states she has been taking it- unclear if she has been taking more than prescribed at times (?). She is currently not presenting with delirium or confusion- she is fully alert, attentive , and 0x3.  Valproic Acid below therapeutic levels - 39. HgbA1C 5.9. Lipid panel unremarkable . Principal Problem: Bipolar affective disorder, current episode manic without psychotic symptoms Diagnosis:   Patient Active Problem List   Diagnosis Date Noted  . Bipolar affective disorder, current episode manic without psychotic symptoms [F31.10] 09/05/2014  . Bipolar affective disorder, current episode manic with psychotic symptoms [F31.2] 09/05/2014  . Bipolar affective disorder, manic [F31.10] 09/03/2014  . Encounter for preadmission testing [Z01.818]   . PNA (pneumonia) [J18.9] 04/27/2014  . Type 2 diabetes mellitus [E11.9] 04/27/2014  . Essential hypertension [I10] 04/27/2014  . Hypothyroidism [E03.9] 04/27/2014  . Tobacco abuse [Z72.0] 04/27/2014  . Bipolar I disorder, most recent episode (or  current) unspecified [F31.9] 04/20/2014  . Acute encephalopathy [G93.40] 04/18/2014  . Unresponsiveness [R40.4]   . Acute respiratory failure [J96.00]   . Encounter for feeding tube placement [Z87.898]   . Rapid atrial fibrillation [I48.91]   . Shortness of breath [R06.02]    Total Time spent with patient: 25 minutes    Past Medical History:  Past Medical History  Diagnosis Date  . Bipolar 1 disorder   . Hypothyroid   . Dyslipidemia   . Retinal detachment   . Sleep apnea   . Diabetes mellitus     dx within last yr....just takes pills  . Cancer     cancer of uterus...no chemo or radiation    Past Surgical History  Procedure Laterality Date  . Abdominal hysterectomy    . Total abdominal hysterectomy w/ bilateral salpingoophorectomy    . Tonsillectomy    . Lumbar laminectomy/decompression microdiscectomy  07/17/2011    Procedure: LUMBAR LAMINECTOMY/DECOMPRESSION MICRODISCECTOMY;  Surgeon: Sinclair Ship, MD;  Location: [facility];  Service: Orthopedics;  Laterality: Left;  Left sided lumbar 4-5 microdisectomy   Family History: History reviewed. No pertinent family history. Social History:  History  Alcohol Use No    Comment: socially     History  Drug Use No    History   Social History  . Marital Status: Divorced    Spouse Name: N/A  . Number of Children: N/A  . Years of Education: N/A   Social History Main Topics  . Smoking status: Current Every Day Smoker -- 0.25 packs/day for 35 years    Types: Cigarettes    Last Attempt to Quit: 07/09/1999  . Smokeless tobacco: Not on file  . Alcohol Use: No  Comment: socially  . Drug Use: No  . Sexual Activity: Not on file   Other Topics Concern  . None   Social History Narrative   Additional History:    Sleep: Fair  Appetite:  Good   Assessment:   Musculoskeletal: Strength & Muscle Tone: within normal limits Gait & Station: normal Patient leans: N/A   Psychiatric Specialty Exam: Physical Exam   ROS denies SOB at rest , states urine has " strong odor", but does not endorse dysuria.  Blood pressure 139/81, pulse 118, temperature 97.8 F (36.6 C), temperature source Oral, resp. rate 18, height _0  (1.549 m), weight 165 lb (74.844 kg).Body mass index is 31.19 kg/(m^2).  General Appearance: Fairly Groomed  Engineer, water::  Good  Speech:  less pressured   Volume:  Normal  Mood:  still manic, but partially improved   Affect:  less expanisve   Thought Process:  less disorganized,  Orientation:  Full (Time, Place, and Person)  Thought Content:  denies hallucinations, no delusions expresed today  Suicidal Thoughts:  No  Homicidal Thoughts:  No  Memory:  recent and remote grossly intact   Judgement:  Fair  Insight:  Present  Psychomotor Activity:  less restless today  Concentration:  Fair  Recall:  Good  Fund of Knowledge:Good  Language: Good  Akathisia:  Negative  Handed:  Right  AIMS (if indicated):     Assets:  Desire for Improvement Resilience  ADL's:  Impaired  Cognition: WNL  Sleep:  Number of Hours: 3     Current Medications: Current Facility-Administered Medications  Medication Dose Route Frequency Provider Last Rate Last Dose  . acetaminophen (TYLENOL) tablet 650 mg  650 mg Oral Q6H PRN Patrecia Pour, NP      . alum & mag hydroxide-simeth (MAALOX/MYLANTA) 200-200-20 MG/5ML suspension 30 mL  30 mL Oral Q4H PRN Patrecia Pour, NP      . aspirin EC tablet 325 mg  325 mg Oral Daily Patrecia Pour, NP   325 mg at 09/07/14 0858  . atorvastatin (LIPITOR) tablet 40 mg  40 mg Oral QPM Patrecia Pour, NP   40 mg at 09/06/14 1809  . colchicine tablet 0.6 mg  0.6 mg Oral Daily Patrecia Pour, NP   0.6 mg at 09/07/14 0858  . cyclobenzaprine (FLEXERIL) tablet 10 mg  10 mg Oral TID PRN Patrecia Pour, NP   10 mg at 09/07/14 1148  . diclofenac sodium (VOLTAREN) 1 % transdermal gel 2 g  2 g Topical Daily Laverle Hobby, PA-C   2 g at 09/07/14 0857  . divalproex (DEPAKOTE ER) 24  hr tablet 500 mg  500 mg Oral QHS Patrecia Pour, NP   500 mg at 09/06/14 2158  . fluticasone (FLONASE) 50 MCG/ACT nasal spray 2 spray  2 spray Each Nare BID Patrecia Pour, NP   2 spray at 09/07/14 0857  . ibuprofen (ADVIL,MOTRIN) tablet 600 mg  600 mg Oral Q6H PRN Patrecia Pour, NP   600 mg at 09/06/14 2158  . insulin aspart (novoLOG) injection 0-20 Units  0-20 Units Subcutaneous TID WC Laverle Hobby, PA-C   3 Units at 09/06/14 1722  . insulin aspart (novoLOG) injection 0-5 Units  0-5 Units Subcutaneous QHS Laverle Hobby, PA-C   0 Units at 09/05/14 2200  . lisinopril (PRINIVIL,ZESTRIL) tablet 5 mg  5 mg Oral Daily Patrecia Pour, NP   5 mg at 09/07/14 0858  . LORazepam (  ATIVAN) tablet 0.5 mg  0.5 mg Oral Q6H PRN Jenne Campus, MD   0.5 mg at 09/06/14 2158  . magnesium hydroxide (MILK OF MAGNESIA) suspension 30 mL  30 mL Oral Daily PRN Patrecia Pour, NP      . meclizine (ANTIVERT) tablet 12.5 mg  12.5 mg Oral TID PRN Patrecia Pour, NP      . metFORMIN (GLUCOPHAGE) tablet 500 mg  500 mg Oral BID WC Patrecia Pour, NP   500 mg at 09/07/14 0858  . metoprolol (LOPRESSOR) tablet 50 mg  50 mg Oral BID WC Patrecia Pour, NP   50 mg at 09/07/14 0858  . nicotine polacrilex (NICORETTE) gum 2 mg  2 mg Oral PRN Ursula Alert, MD      . potassium chloride (K-DUR) CR tablet 20 mEq  20 mEq Oral Daily Patrecia Pour, NP   20 mEq at 09/07/14 0857  . QUEtiapine (SEROQUEL) tablet 150 mg  150 mg Oral QHS Jenne Campus, MD   150 mg at 09/06/14 2339    Lab Results:  Results for orders placed or performed during the hospital encounter of 09/05/14 (from the past 48 hour(s))  Glucose, capillary     Status: Abnormal   Collection Time: 09/05/14  9:30 PM  Result Value Ref Range   Glucose-Capillary 110 (H) 65 - 99 mg/dL  Glucose, capillary     Status: Abnormal   Collection Time: 09/06/14  6:14 AM  Result Value Ref Range   Glucose-Capillary 114 (H) 65 - 99 mg/dL   Comment 1 Notify RN   Hemoglobin A1c      Status: Abnormal   Collection Time: 09/06/14  6:30 AM  Result Value Ref Range   Hgb A1c MFr Bld 5.9 (H) 4.8 - 5.6 %    Comment: (NOTE)         Pre-diabetes: 5.7 - 6.4         Diabetes: >6.4         Glycemic control for adults with diabetes: <7.0    Mean Plasma Glucose 123 mg/dL    Comment: (NOTE) Performed At: [facility] [place], [place] [id number] Lindon Romp MD NF:[id number] Performed at [facility]   Lipid panel     Status: None   Collection Time: 09/06/14  6:30 AM  Result Value Ref Range   Cholesterol 113 0 - 200 mg/dL   Triglycerides 117 <150 mg/dL   HDL 52 >40 mg/dL   Total CHOL/HDL Ratio 2.2 RATIO   VLDL 23 0 - 40 mg/dL   LDL Cholesterol 38 0 - 99 mg/dL    Comment:        Total Cholesterol/HDL:CHD Risk Coronary Heart Disease Risk Table                     Men   Women  1/2 Average Risk   3.4   3.3  Average Risk       5.0   4.4  2 X Average Risk   9.6   7.1  3 X Average Risk  23.4   11.0        Use the calculated Patient Ratio above and the CHD Risk Table to determine the patient's CHD Risk.        ATP III CLASSIFICATION (LDL):  <100     mg/dL   Optimal  100-129  mg/dL   Near or Above  Optimal  130-159  mg/dL   Borderline  160-189  mg/dL   High  >190     mg/dL   Very High Performed at [facility]   Hepatic function panel     Status: Abnormal   Collection Time: 09/06/14  6:30 AM  Result Value Ref Range   Total Protein 6.8 6.5 - 8.1 g/dL   Albumin 4.1 3.5 - 5.0 g/dL   AST 21 15 - 41 U/L   ALT 30 14 - 54 U/L   Alkaline Phosphatase 68 38 - 126 U/L   Total Bilirubin 0.4 0.3 - 1.2 mg/dL   Bilirubin, Direct <0.1 (L) 0.1 - 0.5 mg/dL   Indirect Bilirubin NOT CALCULATED 0.3 - 0.9 mg/dL    Comment: Performed at [facility]  Prolactin     Status: None   Collection Time: 09/06/14  6:30 AM  Result Value Ref Range   Prolactin 11.8 4.8 - 23.3 ng/mL    Comment:  (NOTE) Performed At: [facility] [place], [place] [id number] Lindon Romp MD TO:[id number] Performed at [facility]   Glucose, capillary     Status: Abnormal   Collection Time: 09/06/14 11:53 AM  Result Value Ref Range   Glucose-Capillary 203 (H) 65 - 99 mg/dL  Glucose, capillary     Status: Abnormal   Collection Time: 09/06/14  5:04 PM  Result Value Ref Range   Glucose-Capillary 138 (H) 65 - 99 mg/dL  Glucose, capillary     Status: None   Collection Time: 09/06/14  8:57 PM  Result Value Ref Range   Glucose-Capillary 92 65 - 99 mg/dL  Glucose, capillary     Status: Abnormal   Collection Time: 09/07/14  6:02 AM  Result Value Ref Range   Glucose-Capillary 115 (H) 65 - 99 mg/dL  Valproic acid level     Status: Abnormal   Collection Time: 09/07/14  6:15 AM  Result Value Ref Range   Valproic Acid Lvl 39 (L) 50.0 - 100.0 ug/mL    Comment: Performed at [facility]  Glucose, capillary     Status: None   Collection Time: 09/07/14 11:56 AM  Result Value Ref Range   Glucose-Capillary 87 65 - 99 mg/dL    Physical Findings: AIMS: Facial and Oral Movements Muscles of Facial Expression: None, normal Lips and Perioral Area: None, normal Jaw: None, normal Tongue: None, normal,Extremity Movements Upper (arms, wrists, hands, fingers): None, normal Lower (legs, knees, ankles, toes): None, normal, Trunk Movements Neck, shoulders, hips: None, normal, Overall Severity Severity of abnormal movements (highest score from questions above): None, normal Incapacitation due to abnormal movements: None, normal Patient's awareness of abnormal movements (rate only patient's report): No Awareness, Dental Status Current problems with teeth and/or dentures?: No Does patient usually wear dentures?: No  CIWA:    COWS:      Assessment- patient remains manic, presenting with ongoing increased rate of speech, tendency to be intrusive,  disorganized. However, there is some improvement compared to her admission presentation and she does not seem quite as manic or elated today. She is tolerating medications well- denies side effects from medications . Valproic Acid Serum level low . She is reporting significant difficulties getting her needs met at home due to her limited support network. Of note, patient states she  Prefers lithium over Seroquel, which she states has been more effective in the past. However, lithium is currently listed as a medication patient is allergic to-  patient unsure why. However, based on potential for serious side effects, particularly if allergic , will avoid this medication at present and continue Seroquel management.  She reports some strong odor from her urine but does not endorse fever, chills or other symptoms of UTI .   Treatment Plan Summary: Daily contact with patient to assess and evaluate symptoms and progress in treatment, Medication management, Plan ongoing inpatient treatment and medication management as below  Increase Depakote ER 750 mgrs QHS , for mood disorder,- increase due to low serum level. Increase Seroquel to  200 mgrs QHS , To address mood disorder  Ativan 0.5 mgrs TID for management of agitation and mood disorder, to be held if sleepy or sedated .Marland Kitchen  Continue Ativan 0.5 mgrs Q 6 hours PRN agitation . Mania, anxiety.  Obtain UA , U Culture to follow up on her report of urinary symptoms. Obtain hospitalist consult regarding medical management and management of history of hypothyroidism.    Medical Decision Making:  Established Problem, Stable/Improving (1), Review of Psycho-Social Stressors (1), Review or order clinical lab tests (1) and Review of New Medication or Change in Dosage (2)     COBOS, FERNANDO 09/07/2014, 3:00 PM

## 2014-09-07 NOTE — Progress Notes (Signed)
Pt has been up and down the hall tonight  She looks very sleepy but states she is unable to go to sleep and want [place] to call the doctor and get Kelsey Hanna   Encouraged pt to speak with her doctor in the morning and told her it was too late to get a sleeping pill   Pt was very argumentative about her medications and staffs attempt to redirect her   She demanded to go into the dayroom and said she did not care what other people did she wants to do what she wants to do

## 2014-09-07 NOTE — Clinical Social Work Note (Signed)
Kelsey Hanna, [name] [phone]) called, states he met w patient and is submitting paperwork for authorization to begin ACT services.   Edwyna Shell, LCSW Clinical Social Worker

## 2014-09-07 NOTE — Progress Notes (Signed)
Patient ID: Kelsey Hanna, female   DOB: February 03, 1951, 64 y.o.   MRN: [id number]  D: Patient pleasant on approach tonight. Reports she needed to come here to get her medications to help stabilize her. Patient reports mood much improved. Patient demanding at times with some concrete thinking. No false accusations toward staff or other patients observed. Will leave note and tell other shift about synthroid ordered IV. A: Staff will monitor on q 15 minute checks, follow treatment plan, and give medications as ordered. R: Appropriate on the unit. Medication increased tonight and patient became drowsy early so she went to bed.

## 2014-09-08 ENCOUNTER — Ambulatory Visit: Payer: Self-pay | Admitting: Family

## 2014-09-08 DIAGNOSIS — F312 Bipolar disorder, current episode manic severe with psychotic features: Secondary | ICD-10-CM

## 2014-09-08 DIAGNOSIS — I4891 Unspecified atrial fibrillation: Secondary | ICD-10-CM

## 2014-09-08 DIAGNOSIS — I10 Essential (primary) hypertension: Secondary | ICD-10-CM

## 2014-09-08 LAB — URINALYSIS W MICROSCOPIC (NOT AT ARMC)
BILIRUBIN URINE: NEGATIVE
Glucose, UA: NEGATIVE mg/dL
Hgb urine dipstick: NEGATIVE
Ketones, ur: NEGATIVE mg/dL
Nitrite: NEGATIVE
PROTEIN: NEGATIVE mg/dL
Specific Gravity, Urine: 1.027 (ref 1.005–1.030)
Urobilinogen, UA: 0.2 mg/dL (ref 0.0–1.0)
pH: 5.5 (ref 5.0–8.0)

## 2014-09-08 LAB — GLUCOSE, CAPILLARY
GLUCOSE-CAPILLARY: 100 mg/dL — AB (ref 65–99)
GLUCOSE-CAPILLARY: 102 mg/dL — AB (ref 65–99)
GLUCOSE-CAPILLARY: 112 mg/dL — AB (ref 65–99)
Glucose-Capillary: 95 mg/dL (ref 65–99)
Glucose-Capillary: 84 mg/dL (ref 65–99)
Glucose-Capillary: 98 mg/dL (ref 65–99)

## 2014-09-08 MED ORDER — LEVOTHYROXINE SODIUM 100 MCG PO TABS
100.0000 ug | ORAL_TABLET | Freq: Every day | ORAL | Status: DC
  Administered 2014-09-08 – 2014-09-12 (×5): 100 ug via ORAL
  Filled 2014-09-08 (×7): qty 1

## 2014-09-08 NOTE — [facility] Group Notes (Signed)
[facility]     Type of Therapy:  Group Therapy  Participation Level:  Active  Participation Quality:  Attentive  Affect:  Excited  Cognitive:  Appropriate and Oriented  Insight:  Developing/Improving  Engagement in Therapy:  Engaged  Modes of Intervention:  Discussion and Support  Summary of Progress/Problems:  Finding Balance in Life. Today's group focused on defining balance in one's own words, identifying things that can knock one off balance, and exploring healthy ways to maintain balance in life. Group members were asked to provide an example of a time when they felt off balance, describe how they handled that situation, and process healthier ways to regain balance in the future. Group members were asked to share the most important tool for maintaining balance that they learned while at [facility] and how they plan to apply this method after discharge.  Patient states her life is "unbalanced", "always."  Refers to chronic pain issues, lack of equilibrium.  Patient participated in group w great delight, often on topic, sometimes responding concretely to issues raised.  Offered appropriate support and redirection to peer, appeared to appreciate support from others.  Stated that her ways to maintain/regain balance include music and her sense of humor.   Beverely Pace 09/08/2014 3:21 PM

## 2014-09-08 NOTE — Progress Notes (Signed)
[facility] MD Progress Note  09/08/2014 5:35 PM Kelsey Hanna  MRN:  [id number] Subjective:   Patient states being in a good mood. She reports poor sleep, because she reports not receiving ambien here in hospital. She states medications are well tolerated . Denies medication side effects. Objective : I have discussed case with treatment team and have met with patient. She is less manic, although today less pressured, less restless, and better organized .  She states that due to her living alone, her worsening eye sight, and her limited support network, " I need a lot of social services.I will talk to the social worker about that ". 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  She denies medication side effects at present. As discussed with staff, continues to be somewhat loud, intrusive, intermittently demanding, having difficulty with boundaries, but generally redirectable . We discussed lab findings from yesterday, including elevated glucose. She is currently not presenting with delirium or confusion- she is fully alert, attentive , and 0x3.  Valproic Acid below therapeutic levels - 39. HgbA1C 5.9. Lipid panel unremarkable . Principal Problem: Bipolar affective disorder, current episode manic without psychotic symptoms Diagnosis:   Patient Active Problem List   Diagnosis Date Noted  . Bipolar affective disorder, current episode manic without psychotic symptoms [F31.10] 09/05/2014  . Bipolar affective disorder, current episode manic with psychotic symptoms [F31.2] 09/05/2014  . Bipolar affective disorder, manic [F31.10] 09/03/2014  . Encounter for preadmission testing [Z01.818]   . PNA (pneumonia) [J18.9] 04/27/2014  . Type 2 diabetes mellitus [E11.9] 04/27/2014  . Essential hypertension [I10] 04/27/2014  . Hypothyroidism [E03.9] 04/27/2014  . Tobacco abuse [Z72.0] 04/27/2014  . Bipolar I disorder, most recent episode (or current) unspecified [F31.9] 04/20/2014  . Acute encephalopathy  [G93.40] 04/18/2014  . Unresponsiveness [R40.4]   . Acute respiratory failure [J96.00]   . Encounter for feeding tube placement [Z87.898]   . Rapid atrial fibrillation [I48.91]   . Shortness of breath [R06.02]    Total Time spent with patient: 25 minutes    Past Medical History:  Past Medical History  Diagnosis Date  . Bipolar 1 disorder   . Hypothyroid   . Dyslipidemia   . Retinal detachment   . Sleep apnea   . Diabetes mellitus     dx within last yr....just takes pills  . Cancer     cancer of uterus...no chemo or radiation    Past Surgical History  Procedure Laterality Date  . Abdominal hysterectomy    . Total abdominal hysterectomy w/ bilateral salpingoophorectomy    . Tonsillectomy    . Lumbar laminectomy/decompression microdiscectomy  07/17/2011    Procedure: LUMBAR LAMINECTOMY/DECOMPRESSION MICRODISCECTOMY;  Surgeon: Sinclair Ship, MD;  Location: [facility];  Service: Orthopedics;  Laterality: Left;  Left sided lumbar 4-5 microdisectomy   Family History: History reviewed. No pertinent family history. Social History:  History  Alcohol Use No    Comment: socially     History  Drug Use No    History   Social History  . Marital Status: Divorced    Spouse Name: N/A  . Number of Children: N/A  . Years of Education: N/A   Social History Main Topics  . Smoking status: Current Every Day Smoker -- 0.25 packs/day for 35 years    Types: Cigarettes    Last Attempt to Quit: 07/09/1999  . Smokeless tobacco: Not on file  . Alcohol Use: No     Comment: socially  .  Drug Use: No  . Sexual Activity: Not on file   Other Topics Concern  . None   Social History Narrative   Additional History:    Sleep: Poor  Appetite:  Poor   Assessment:   Musculoskeletal: Strength & Muscle Tone: within normal limits Gait & Station: normal Patient leans: N/A   Psychiatric Specialty Exam: Physical Exam  ROS denies SOB at rest , states urine has " strong odor", but does  not endorse dysuria.  Blood pressure 118/66, pulse 79, temperature 98.3 F (36.8 C), temperature source Oral, resp. rate 20, height $RemoveBe'5\' 1"'[id number]$  (1.549 m), weight 74.844 kg (165 lb).Body mass index is 31.19 kg/(m^2).  General Appearance: Fairly Groomed  Engineer, water::  Good  Speech:  less pressured   Volume:  Normal  Mood:  still manic, but partially improved   Affect:  less expanisve   Thought Process:  less disorganized,  Orientation:  Full (Time, Place, and Person)  Thought Content:  denies hallucinations, no delusions expresed today  Suicidal Thoughts:  No  Homicidal Thoughts:  No  Memory:  recent and remote grossly intact   Judgement:  Fair  Insight:  Present  Psychomotor Activity:  less restless today  Concentration:  Fair  Recall:  Good  Fund of Knowledge:Good  Language: Good  Akathisia:  Negative  Handed:  Right  AIMS (if indicated):     Assets:  Desire for Improvement Resilience  ADL's:  Impaired  Cognition: WNL  Sleep:  Number of Hours: 2.75     Current Medications: Current Facility-Administered Medications  Medication Dose Route Frequency Provider Last Rate Last Dose  . acetaminophen (TYLENOL) tablet 650 mg  650 mg Oral Q6H PRN Patrecia Pour, NP      . alum & mag hydroxide-simeth (MAALOX/MYLANTA) 200-200-20 MG/5ML suspension 30 mL  30 mL Oral Q4H PRN Patrecia Pour, NP      . aspirin EC tablet 325 mg  325 mg Oral Daily Patrecia Pour, NP   325 mg at 09/08/14 0744  . atorvastatin (LIPITOR) tablet 40 mg  40 mg Oral QPM Patrecia Pour, NP   40 mg at 09/08/14 1651  . colchicine tablet 0.6 mg  0.6 mg Oral Daily Patrecia Pour, NP   0.6 mg at 09/08/14 0744  . cyclobenzaprine (FLEXERIL) tablet 10 mg  10 mg Oral TID PRN Patrecia Pour, NP   10 mg at 09/08/14 1656  . diclofenac sodium (VOLTAREN) 1 % transdermal gel 2 g  2 g Topical Daily Laverle Hobby, PA-C   2 g at 09/08/14 0744  . divalproex (DEPAKOTE ER) 24 hr tablet 750 mg  750 mg Oral QHS Jenne Campus, MD   750 mg at  09/07/14 2153  . fluticasone (FLONASE) 50 MCG/ACT nasal spray 2 spray  2 spray Each Nare BID Patrecia Pour, NP   2 spray at 09/08/14 0744  . ibuprofen (ADVIL,MOTRIN) tablet 600 mg  600 mg Oral Q6H PRN Patrecia Pour, NP   600 mg at 09/06/14 2158  . insulin aspart (novoLOG) injection 0-20 Units  0-20 Units Subcutaneous TID WC Laverle Hobby, PA-C   3 Units at 09/06/14 1722  . insulin aspart (novoLOG) injection 0-5 Units  0-5 Units Subcutaneous QHS Laverle Hobby, PA-C   0 Units at 09/05/14 2200  . levothyroxine (SYNTHROID, LEVOTHROID) tablet 100 mcg  100 mcg Oral QAC breakfast Ursula Alert, MD   100 mcg at 09/08/14 0804  . lisinopril (PRINIVIL,ZESTRIL) tablet 5  mg  5 mg Oral Daily Charm Rings, NP   5 mg at 09/08/14 0743  . LORazepam (ATIVAN) tablet 0.5 mg  0.5 mg Oral Q6H PRN Craige Cotta, MD   0.5 mg at 09/07/14 2153  . LORazepam (ATIVAN) tablet 0.5 mg  0.5 mg Oral TID Craige Cotta, MD   0.5 mg at 09/08/14 1656  . magnesium hydroxide (MILK OF MAGNESIA) suspension 30 mL  30 mL Oral Daily PRN Charm Rings, NP      . meclizine (ANTIVERT) tablet 12.5 mg  12.5 mg Oral TID PRN Charm Rings, NP      . metFORMIN (GLUCOPHAGE) tablet 500 mg  500 mg Oral BID WC Charm Rings, NP   500 mg at 09/08/14 1651  . metoprolol (LOPRESSOR) tablet 50 mg  50 mg Oral BID WC Charm Rings, NP   50 mg at 09/08/14 1651  . nicotine polacrilex (NICORETTE) gum 2 mg  2 mg Oral PRN Jomarie Longs, MD      . potassium chloride (K-DUR) CR tablet 20 mEq  20 mEq Oral Daily Charm Rings, NP   20 mEq at 09/08/14 0742  . QUEtiapine (SEROQUEL) tablet 200 mg  200 mg Oral QHS Craige Cotta, MD   200 mg at 09/07/14 2154  . traMADol (ULTRAM) tablet 100 mg  100 mg Oral Q6H PRN Simbiso Ranga, MD   100 mg at 09/08/14 1656  . zolpidem (AMBIEN) tablet 5 mg  5 mg Oral QHS PRN Conley Canal, MD        Lab Results:  Results for orders placed or performed during the hospital encounter of 09/05/14 (from the past 48  hour(s))  Glucose, capillary     Status: None   Collection Time: 09/06/14  8:57 PM  Result Value Ref Range   Glucose-Capillary 92 65 - 99 mg/dL  Glucose, capillary     Status: Abnormal   Collection Time: 09/07/14  6:02 AM  Result Value Ref Range   Glucose-Capillary 115 (H) 65 - 99 mg/dL  Valproic acid level     Status: Abnormal   Collection Time: 09/07/14  6:15 AM  Result Value Ref Range   Valproic Acid Lvl 39 (L) 50.0 - 100.0 ug/mL    Comment: Performed at [facility]  Glucose, capillary     Status: None   Collection Time: 09/07/14 11:56 AM  Result Value Ref Range   Glucose-Capillary 87 65 - 99 mg/dL  Glucose, capillary     Status: None   Collection Time: 09/07/14  4:48 PM  Result Value Ref Range   Glucose-Capillary 86 65 - 99 mg/dL  CBC     Status: None   Collection Time: 09/07/14  7:34 PM  Result Value Ref Range   WBC 10.1 4.0 - 10.5 K/uL   RBC 4.15 3.87 - 5.11 MIL/uL   Hemoglobin 12.6 12.0 - 15.0 g/dL   HCT 31.5 94.5 - 85.9 %   MCV 92.5 78.0 - 100.0 fL   MCH 30.4 26.0 - 34.0 pg   MCHC 32.8 30.0 - 36.0 g/dL   RDW 29.2 44.6 - 28.6 %   Platelets 317 150 - 400 K/uL    Comment: Performed at [facility]  Comprehensive metabolic panel     Status: Abnormal   Collection Time: 09/07/14  7:34 PM  Result Value Ref Range   Sodium 141 135 - 145 mmol/L   Potassium 4.1 3.5 - 5.1 mmol/L  Chloride 108 101 - 111 mmol/L   CO2 24 22 - 32 mmol/L   Glucose, Bld 115 (H) 65 - 99 mg/dL   BUN 12 6 - 20 mg/dL   Creatinine, Ser 0.64 0.44 - 1.00 mg/dL   Calcium 9.7 8.9 - 10.3 mg/dL   Total Protein 7.2 6.5 - 8.1 g/dL   Albumin 4.3 3.5 - 5.0 g/dL   AST 27 15 - 41 U/L   ALT 33 14 - 54 U/L   Alkaline Phosphatase 70 38 - 126 U/L   Total Bilirubin 0.4 0.3 - 1.2 mg/dL   GFR calc non Af Amer >60 >60 mL/min   GFR calc Af Amer >60 >60 mL/min    Comment: (NOTE) The eGFR has been calculated using the CKD EPI equation. This calculation has not been validated in  all clinical situations. eGFR's persistently <60 mL/min signify possible Chronic Kidney Disease.    Anion gap 9 5 - 15    Comment: Performed at [facility]  Magnesium     Status: None   Collection Time: 09/07/14  7:34 PM  Result Value Ref Range   Magnesium 1.9 1.7 - 2.4 mg/dL    Comment: Performed at [facility]  Phosphorus     Status: None   Collection Time: 09/07/14  7:34 PM  Result Value Ref Range   Phosphorus 4.0 2.5 - 4.6 mg/dL    Comment: Performed at [facility]  Glucose, capillary     Status: Abnormal   Collection Time: 09/07/14  9:43 PM  Result Value Ref Range   Glucose-Capillary 157 (H) 65 - 99 mg/dL   Comment 1 Notify RN   Glucose, capillary     Status: Abnormal   Collection Time: 09/08/14  3:43 AM  Result Value Ref Range   Glucose-Capillary 100 (H) 65 - 99 mg/dL  Glucose, capillary     Status: None   Collection Time: 09/08/14  6:00 AM  Result Value Ref Range   Glucose-Capillary 95 65 - 99 mg/dL  Glucose, capillary     Status: Abnormal   Collection Time: 09/08/14  9:41 AM  Result Value Ref Range   Glucose-Capillary 102 (H) 65 - 99 mg/dL  Glucose, capillary     Status: None   Collection Time: 09/08/14 11:59 AM  Result Value Ref Range   Glucose-Capillary 84 65 - 99 mg/dL  Glucose, capillary     Status: Abnormal   Collection Time: 09/08/14  4:45 PM  Result Value Ref Range   Glucose-Capillary 112 (H) 65 - 99 mg/dL    Physical Findings: AIMS: Facial and Oral Movements Muscles of Facial Expression: None, normal Lips and Perioral Area: None, normal Jaw: None, normal Tongue: None, normal,Extremity Movements Upper (arms, wrists, hands, fingers): None, normal Lower (legs, knees, ankles, toes): None, normal, Trunk Movements Neck, shoulders, hips: None, normal, Overall Severity Severity of abnormal movements (highest score from questions above): None, normal Incapacitation due to abnormal movements: None,  normal Patient's awareness of abnormal movements (rate only patient's report): No Awareness, Dental Status Current problems with teeth and/or dentures?: No Does patient usually wear dentures?: No  CIWA:    COWS:      Assessment- patient remains  Less manic, presenting with ongoing increased rate of speech, tendency to be intrusive, disorganized. However, there is some improvement compared to her admission presentation and she does not seem quite as manic or elated today. She is tolerating medications well- denies side effects from medications . Valproic  Acid Serum level low . She is reporting significant difficulties getting her needs met at home due to her limited support network. Of note, patient states she  Prefers lithium over Seroquel, which she states has been more effective in the past. However, lithium is currently listed as a medication patient is allergic to- patient unsure why. However, based on potential for serious side effects, particularly if allergic , will avoid this medication at present and continue Seroquel management.  She reports some strong odor from her urine but does not endorse fever, chills or other symptoms of UTI .   Treatment Plan Summary: Daily contact with patient to assess and evaluate symptoms and progress in treatment, Medication management, Plan ongoing inpatient treatment and medication management as below  Continue Depakote ER 750 mgrs QHS , for mood disorder,- increased yesterday due to low serum level. Will need to check level in 5 days.  Continue Seroquel 200 mgrs QHS , To address mood disorder  Ativan 0.5 mgrs TID for management of agitation and mood disorder, to be held if sleepy or sedated.  Continue Ativan 0.5 mgrs Q 6 hours PRN agitation . Mania, anxiety.  Obtain UA , U Culture to follow up on her report of urinary symptoms. Hospitalist has signed off case, input appreciated. Consider discharge tomorrow, if pt continues to stabilize.   Medical  Decision Making:  Established Problem, Stable/Improving (1), Review of Psycho-Social Stressors (1), Review or order clinical lab tests (1) and Review of New Medication or Change in Dosage (2)     Misa Fedorko 09/08/2014, 5:35 PM

## 2014-09-08 NOTE — Progress Notes (Addendum)
Pt at times can be very intrusive. She does contract for safety. Pt has multiple somatic complaints and has asked for massages. Pt was instructed that massages are not given . Pt stated she has pain all the time with muscle cramping. She has received medication for this. Pt did go outdoors with the other pts and has been socializing in the dayroom. She requested to speak to the DON about employment here. Letitia Libra, Ac was notified and will speak with the pt today. 12noon-Pts FSBS was 84 today. Pt did not receive coverage for her sugar. Dr. Wyline Copas phoned to check on the pt.

## 2014-09-08 NOTE — Progress Notes (Signed)
[facility] PROGRESS NOTE  Kelsey Hanna TMH:[id number] DOB: 05/07/1950 DOA: 09/05/2014 PCP: Kelsey Hanna  Assessment/Plan: Rapid atrial fibrillation/Essential hypertension  BP stable, controlled  HR rate controlled  Continue Lopressor, continue lisinopril and aspirin.   Patient not anticoagulated long-term, probably due to poor compliance. Type 2 diabetes mellitus  Generally controlled  Continue metformin  CBC/CMP/UA Hypothyroidism  Reduced Synthroid to 100 g daily Tobacco abuse  Continue nicotine patch Bipolar affective disorder, current episode manic with psychotic symptoms  Deferred to psychiatry Insomnia/arthritis  Ambien  Ultram/colchicine   HPI/Subjective: Chart reviewed. Discussed case with staff. Vital signs have remained stable and improved since yesterday. Pt's only concerns are about starting lithium.  Patient seems medically stable at this time. Would continue current regimen as already ordered. Will sign off at this time. Please do not hesitate to call for questions.  Objective: Filed Vitals:   09/07/14 0827 09/07/14 1619 09/08/14 0610 09/08/14 0611  BP: 139/81 134/85 103/87 133/85  Pulse: 118 106 64 93  Temp:   98.3 F (36.8 C)   TempSrc:   Oral   Resp:   20   Height:      Weight:       No intake or output data in the 24 hours ending 09/08/14 1505 Filed Weights   09/05/14 2112  Weight: 74.844 kg (165 lb)    Data Reviewed: Basic Metabolic Panel:  Recent Labs Lab 09/07/14 1934  NA 141  K 4.1  CL 108  CO2 24  GLUCOSE 115*  BUN 12  CREATININE 0.64  CALCIUM 9.7  MG 1.9  PHOS 4.0   Liver Function Tests:  Recent Labs Lab 09/06/14 0630 09/07/14 1934  AST 21 27  ALT 30 33  ALKPHOS 68 70  BILITOT 0.4 0.4  PROT 6.8 7.2  ALBUMIN 4.1 4.3   No results for input(s): LIPASE, AMYLASE in the last 168 hours. No results for input(s): AMMONIA in the last 168 hours. CBC:  Recent Labs Lab 09/07/14 1934  WBC  10.1  HGB 12.6  HCT 38.4  MCV 92.5  PLT 317   Cardiac Enzymes: No results for input(s): CKTOTAL, CKMB, CKMBINDEX, TROPONINI in the last 168 hours. BNP (last 3 results)  Recent Labs  04/01/14 2018 04/18/14 1148  BNP 37.7 146.4*    ProBNP (last 3 results) No results for input(s): PROBNP in the last 8760 hours.  CBG:  Recent Labs Lab 09/07/14 2143 09/08/14 0343 09/08/14 0600 09/08/14 0941 09/08/14 1159  GLUCAP 157* 100* 95 102* 84    No results found for this or any previous visit (from the past 240 hour(s)).   Studies: No results found.  Scheduled Meds: . aspirin EC  325 mg Oral Daily  . atorvastatin  40 mg Oral QPM  . colchicine  0.6 mg Oral Daily  . diclofenac sodium  2 g Topical Daily  . divalproex  750 mg Oral QHS  . fluticasone  2 spray Each Nare BID  . insulin aspart  0-20 Units Subcutaneous TID WC  . insulin aspart  0-5 Units Subcutaneous QHS  . levothyroxine  100 mcg Oral QAC breakfast  . lisinopril  5 mg Oral Daily  . LORazepam  0.5 mg Oral TID  . metFORMIN  500 mg Oral BID WC  . metoprolol  50 mg Oral BID WC  . potassium chloride  20 mEq Oral Daily  . QUEtiapine  200 mg Oral QHS   Continuous Infusions:   Principal Problem:   Bipolar affective disorder,  current episode manic without psychotic symptoms Active Problems:   Rapid atrial fibrillation   Type 2 diabetes mellitus   Essential hypertension   Hypothyroidism   Tobacco abuse   Bipolar affective disorder, current episode manic with psychotic symptoms   Kelsey Hanna, [facility] Pager [phone]. If 7PM-7AM, please contact night-coverage at [email or web address], password [facility] 09/08/2014, 3:05 PM  LOS: 3 days

## 2014-09-09 LAB — GLUCOSE, CAPILLARY
Glucose-Capillary: 77 mg/dL (ref 65–99)
Glucose-Capillary: 61 mg/dL — ABNORMAL LOW (ref 65–99)
Glucose-Capillary: 137 mg/dL — ABNORMAL HIGH (ref 65–99)
Glucose-Capillary: 75 mg/dL (ref 65–99)
Glucose-Capillary: 71 mg/dL (ref 65–99)

## 2014-09-09 NOTE — Progress Notes (Signed)
Patient ID: Kelsey Hanna, female   DOB: 10-17-1950, 64 y.o.   MRN: [id number]   Pts blood sugar rechecked, result was 137.

## 2014-09-09 NOTE — Progress Notes (Signed)
D   Pt is intrusive and has some disorganized thinking at times   She brightens on approach and has been cooperative   She can be demanding at times  Her mood has improved since hospitalization and her affect is more normal   She has some periods of confusion and forgetfullness A   Verbal support given   Medications administered and effectiveness monitored  Continue to closely monitor her blood sugar and blood pressure   Educate on medications  Redirect as needed R   Pt safe at present and verbalizes understanding

## 2014-09-09 NOTE — Progress Notes (Signed)
Patient ID: Kelsey Hanna, female   DOB: November 21, 1950, 64 y.o.   MRN: [id number] [facility] MD Progress Note  09/09/2014 12:58 PM AZALIA NEUBERGER  MRN:  [id number] Subjective: " I have been doing well except for occasional muscle spasm.''   Objective : Patient seen, interviewed, chart reviewed and case discussed with the treatment team. She is endorsing decreased bipolar symptoms, reporting decreased racing thoughts, sleeping better, a more organized thought process and mood stability. However, patient remains hyper verbal and demanding, she denies HI/SI, AVH or delusional thinking. Patient has been compliant with her medications, unit milieu and other necessary activities, she is looking forward to being discharged home tomorrow. Lab reviewed with the patient: Valproic Acid below therapeutic levels - 39. HgbA1C 5.9. Lipid panel unremarkable .  Principal Problem: Bipolar affective disorder, current episode manic without psychotic symptoms Diagnosis:   Patient Active Problem List   Diagnosis Date Noted  . Bipolar affective disorder, manic [F31.10] 09/03/2014    Priority: High  . Bipolar affective disorder, current episode manic without psychotic symptoms [F31.10] 09/05/2014  . Bipolar affective disorder, current episode manic with psychotic symptoms [F31.2] 09/05/2014  . Encounter for preadmission testing [Z01.818]   . PNA (pneumonia) [J18.9] 04/27/2014  . Type 2 diabetes mellitus [E11.9] 04/27/2014  . Essential hypertension [I10] 04/27/2014  . Hypothyroidism [E03.9] 04/27/2014  . Tobacco abuse [Z72.0] 04/27/2014  . Bipolar I disorder, most recent episode (or current) unspecified [F31.9] 04/20/2014  . Acute encephalopathy [G93.40] 04/18/2014  . Unresponsiveness [R40.4]   . Acute respiratory failure [J96.00]   . Encounter for feeding tube placement [Z87.898]   . Rapid atrial fibrillation [I48.91]   . Shortness of breath [R06.02]    Total Time spent with patient: 25 minutes    Past Medical  History:  Past Medical History  Diagnosis Date  . Bipolar 1 disorder   . Hypothyroid   . Dyslipidemia   . Retinal detachment   . Sleep apnea   . Diabetes mellitus     dx within last yr....just takes pills  . Cancer     cancer of uterus...no chemo or radiation    Past Surgical History  Procedure Laterality Date  . Abdominal hysterectomy    . Total abdominal hysterectomy w/ bilateral salpingoophorectomy    . Tonsillectomy    . Lumbar laminectomy/decompression microdiscectomy  07/17/2011    Procedure: LUMBAR LAMINECTOMY/DECOMPRESSION MICRODISCECTOMY;  Surgeon: Sinclair Ship, MD;  Location: [place];  Service: Orthopedics;  Laterality: Left;  Left sided lumbar 4-5 microdisectomy   Family History: History reviewed. No pertinent family history. Social History:  History  Alcohol Use No    Comment: socially     History  Drug Use No    History   Social History  . Marital Status: Divorced    Spouse Name: N/A  . Number of Children: N/A  . Years of Education: N/A   Social History Main Topics  . Smoking status: Current Every Day Smoker -- 0.25 packs/day for 35 years    Types: Cigarettes    Last Attempt to Quit: 07/09/1999  . Smokeless tobacco: Not on file  . Alcohol Use: No     Comment: socially  . Drug Use: No  . Sexual Activity: Not on file   Other Topics Concern  . None   Social History Narrative   Additional History:    Sleep: good  Appetite:  good   Assessment:   Musculoskeletal: Strength & Muscle Tone: within normal limits Gait & Station: normal Patient  leans: N/A   Psychiatric Specialty Exam: Physical Exam  ROS denies SOB at rest , states urine has " strong odor", but does not endorse dysuria.  Blood pressure 98/59, pulse 82, temperature 97.7 F (36.5 C), temperature source Oral, resp. rate 16, height _0  (1.549 m), weight 74.844 kg (165 lb).Body mass index is 31.19 kg/(m^2).  General Appearance: Fairly Groomed  Engineer, water::  Good  Speech:   Clear and Coherent and Normal Rate  Volume:  Normal  Mood:  Euphoric  Affect:  Appropriate and less expanisve   Thought Process:  Goal Directed and Linear  Orientation:  Full (Time, Place, and Person)  Thought Content:  Negative and denies hallucinations, no delusions expresed today  Suicidal Thoughts:  No  Homicidal Thoughts:  No  Memory:  Immediate;   Fair Recent;   Fair Remote;   Fair  Judgement:  Fair  Insight:  Shallow  Psychomotor Activity:  Normal  Concentration:  Fair  Recall:  Good  Fund of Knowledge:Good  Language: Good  Akathisia:  Negative  Handed:  Right  AIMS (if indicated):     Assets:  Desire for Improvement Resilience  ADL's:  Impaired  Cognition: WNL  Sleep:  Number of Hours: 6.5     Current Medications: Current Facility-Administered Medications  Medication Dose Route Frequency Provider Last Rate Last Dose  . acetaminophen (TYLENOL) tablet 650 mg  650 mg Oral Q6H PRN Patrecia Pour, NP      . alum & mag hydroxide-simeth (MAALOX/MYLANTA) 200-200-20 MG/5ML suspension 30 mL  30 mL Oral Q4H PRN Patrecia Pour, NP      . aspirin EC tablet 325 mg  325 mg Oral Daily Patrecia Pour, NP   325 mg at 09/09/14 0940  . atorvastatin (LIPITOR) tablet 40 mg  40 mg Oral QPM Patrecia Pour, NP   40 mg at 09/08/14 1651  . colchicine tablet 0.6 mg  0.6 mg Oral Daily Patrecia Pour, NP   0.6 mg at 09/09/14 0941  . cyclobenzaprine (FLEXERIL) tablet 10 mg  10 mg Oral TID PRN Patrecia Pour, NP   10 mg at 09/09/14 0940  . diclofenac sodium (VOLTAREN) 1 % transdermal gel 2 g  2 g Topical Daily Laverle Hobby, PA-C   2 g at 09/09/14 0941  . divalproex (DEPAKOTE ER) 24 hr tablet 750 mg  750 mg Oral QHS Jenne Campus, MD   750 mg at 09/08/14 2136  . fluticasone (FLONASE) 50 MCG/ACT nasal spray 2 spray  2 spray Each Nare BID Patrecia Pour, NP   2 spray at 09/09/14 0941  . ibuprofen (ADVIL,MOTRIN) tablet 600 mg  600 mg Oral Q6H PRN Patrecia Pour, NP   600 mg at 09/06/14 2158  .  insulin aspart (novoLOG) injection 0-20 Units  0-20 Units Subcutaneous TID WC Laverle Hobby, PA-C   3 Units at 09/06/14 1722  . insulin aspart (novoLOG) injection 0-5 Units  0-5 Units Subcutaneous QHS Laverle Hobby, PA-C   0 Units at 09/05/14 2200  . levothyroxine (SYNTHROID, LEVOTHROID) tablet 100 mcg  100 mcg Oral QAC breakfast Ursula Alert, MD   100 mcg at 09/09/14 0647  . lisinopril (PRINIVIL,ZESTRIL) tablet 5 mg  5 mg Oral Daily Patrecia Pour, NP   5 mg at 09/08/14 0743  . LORazepam (ATIVAN) tablet 0.5 mg  0.5 mg Oral Q6H PRN Jenne Campus, MD   0.5 mg at 09/07/14 2153  . LORazepam (ATIVAN) tablet  0.5 mg  0.5 mg Oral TID Jenne Campus, MD   0.5 mg at 09/09/14 0940  . magnesium hydroxide (MILK OF MAGNESIA) suspension 30 mL  30 mL Oral Daily PRN Patrecia Pour, NP      . meclizine (ANTIVERT) tablet 12.5 mg  12.5 mg Oral TID PRN Patrecia Pour, NP      . metFORMIN (GLUCOPHAGE) tablet 500 mg  500 mg Oral BID WC Patrecia Pour, NP   500 mg at 09/08/14 1651  . metoprolol (LOPRESSOR) tablet 50 mg  50 mg Oral BID WC Patrecia Pour, NP   50 mg at 09/08/14 1651  . nicotine polacrilex (NICORETTE) gum 2 mg  2 mg Oral PRN Ursula Alert, MD      . potassium chloride (K-DUR) CR tablet 20 mEq  20 mEq Oral Daily Patrecia Pour, NP   20 mEq at 09/09/14 0940  . QUEtiapine (SEROQUEL) tablet 200 mg  200 mg Oral QHS Jenne Campus, MD   200 mg at 09/08/14 2135  . traMADol (ULTRAM) tablet 100 mg  100 mg Oral Q6H PRN Simbiso Ranga, MD   100 mg at 09/09/14 0939  . zolpidem (AMBIEN) tablet 5 mg  5 mg Oral QHS PRN Nat Math, MD   5 mg at 09/08/14 2251    Lab Results:  Results for orders placed or performed during the hospital encounter of 09/05/14 (from the past 48 hour(s))  Glucose, capillary     Status: None   Collection Time: 09/07/14  4:48 PM  Result Value Ref Range   Glucose-Capillary 86 65 - 99 mg/dL  CBC     Status: None   Collection Time: 09/07/14  7:34 PM  Result Value Ref Range    WBC 10.1 4.0 - 10.5 K/uL   RBC 4.15 3.87 - 5.11 MIL/uL   Hemoglobin 12.6 12.0 - 15.0 g/dL   HCT 38.4 36.0 - 46.0 %   MCV 92.5 78.0 - 100.0 fL   MCH 30.4 26.0 - 34.0 pg   MCHC 32.8 30.0 - 36.0 g/dL   RDW 13.1 11.5 - 15.5 %   Platelets 317 150 - 400 K/uL    Comment: Performed at [facility]  Comprehensive metabolic panel     Status: Abnormal   Collection Time: 09/07/14  7:34 PM  Result Value Ref Range   Sodium 141 135 - 145 mmol/L   Potassium 4.1 3.5 - 5.1 mmol/L   Chloride 108 101 - 111 mmol/L   CO2 24 22 - 32 mmol/L   Glucose, Bld 115 (H) 65 - 99 mg/dL   BUN 12 6 - 20 mg/dL   Creatinine, Ser 0.64 0.44 - 1.00 mg/dL   Calcium 9.7 8.9 - 10.3 mg/dL   Total Protein 7.2 6.5 - 8.1 g/dL   Albumin 4.3 3.5 - 5.0 g/dL   AST 27 15 - 41 U/L   ALT 33 14 - 54 U/L   Alkaline Phosphatase 70 38 - 126 U/L   Total Bilirubin 0.4 0.3 - 1.2 mg/dL   GFR calc non Af Amer >60 >60 mL/min   GFR calc Af Amer >60 >60 mL/min    Comment: (NOTE) The eGFR has been calculated using the CKD EPI equation. This calculation has not been validated in all clinical situations. eGFR's persistently <60 mL/min signify possible Chronic Kidney Disease.    Anion gap 9 5 - 15    Comment: Performed at [facility]  Magnesium  Status: None   Collection Time: 09/07/14  7:34 PM  Result Value Ref Range   Magnesium 1.9 1.7 - 2.4 mg/dL    Comment: Performed at [facility]  Phosphorus     Status: None   Collection Time: 09/07/14  7:34 PM  Result Value Ref Range   Phosphorus 4.0 2.5 - 4.6 mg/dL    Comment: Performed at [facility]  Glucose, capillary     Status: Abnormal   Collection Time: 09/07/14  9:43 PM  Result Value Ref Range   Glucose-Capillary 157 (H) 65 - 99 mg/dL   Comment 1 Notify RN   Glucose, capillary     Status: Abnormal   Collection Time: 09/08/14  3:43 AM  Result Value Ref Range   Glucose-Capillary 100 (H) 65 - 99 mg/dL   Glucose, capillary     Status: None   Collection Time: 09/08/14  6:00 AM  Result Value Ref Range   Glucose-Capillary 95 65 - 99 mg/dL  Glucose, capillary     Status: Abnormal   Collection Time: 09/08/14  9:41 AM  Result Value Ref Range   Glucose-Capillary 102 (H) 65 - 99 mg/dL  Glucose, capillary     Status: None   Collection Time: 09/08/14 11:59 AM  Result Value Ref Range   Glucose-Capillary 84 65 - 99 mg/dL  Glucose, capillary     Status: Abnormal   Collection Time: 09/08/14  4:45 PM  Result Value Ref Range   Glucose-Capillary 112 (H) 65 - 99 mg/dL  Urinalysis with microscopic     Status: Abnormal   Collection Time: 09/08/14  7:28 PM  Result Value Ref Range   Color, Urine AMBER (A) YELLOW    Comment: BIOCHEMICALS MAY BE AFFECTED BY COLOR   APPearance CLEAR CLEAR   Specific Gravity, Urine 1.027 1.005 - 1.030   pH 5.5 5.0 - 8.0   Glucose, UA NEGATIVE NEGATIVE mg/dL   Hgb urine dipstick NEGATIVE NEGATIVE   Bilirubin Urine NEGATIVE NEGATIVE   Ketones, ur NEGATIVE NEGATIVE mg/dL   Protein, ur NEGATIVE NEGATIVE mg/dL   Urobilinogen, UA 0.2 0.0 - 1.0 mg/dL   Nitrite NEGATIVE NEGATIVE   Leukocytes, UA SMALL (A) NEGATIVE   WBC, UA 3-6 <3 WBC/hpf   Bacteria, UA FEW (A) RARE   Squamous Epithelial / LPF FEW (A) RARE   Urine-Other MUCOUS PRESENT     Comment: Performed at [facility]  Urine culture     Status: None (Preliminary result)   Collection Time: 09/08/14  7:28 PM  Result Value Ref Range   Specimen Description      URINE, CLEAN CATCH Performed at [facility]      Normal Performed at [place] < 12 HOURS Performed at [facility]    Report Status PENDING   Glucose, capillary     Status: None   Collection Time: 09/08/14  8:31 PM  Result Value Ref Range   Glucose-Capillary 98 65 - 99 mg/dL  Glucose, capillary     Status: None   Collection Time: 09/09/14   6:14 AM  Result Value Ref Range   Glucose-Capillary 77 65 - 99 mg/dL  Glucose, capillary     Status: Abnormal   Collection Time: 09/09/14 12:12 PM  Result Value Ref Range   Glucose-Capillary 61 (L) 65 - 99 mg/dL    Physical Findings: AIMS:  Facial and Oral Movements Muscles of Facial Expression: None, normal Lips and Perioral Area: None, normal Jaw: None, normal Tongue: None, normal,Extremity Movements Upper (arms, wrists, hands, fingers): None, normal Lower (legs, knees, ankles, toes): None, normal, Trunk Movements Neck, shoulders, hips: None, normal, Overall Severity Severity of abnormal movements (highest score from questions above): None, normal Incapacitation due to abnormal movements: None, normal Patient's awareness of abnormal movements (rate only patient's report): No Awareness, Dental Status Current problems with teeth and/or dentures?: No Does patient usually wear dentures?: No  CIWA:    COWS:      Assessment- patient remains  Less manic but still hyper verbal, has  tendency to be intrusive, disorganized. However, there is some improvement compared to her admission presentation and she does not seem quite as manic or elated today. She is tolerating medications well- denies side effects from medications . Valproic Acid Serum level low . She is reporting significant difficulties getting her needs met at home due to her limited support network. Of note, patient states she  Prefers lithium over Seroquel, which she states has been more effective in the past. However, lithium is currently listed as a medication patient is allergic to- patient unsure why. However, based on potential for serious side effects, particularly if allergic , will avoid this medication at present and continue Seroquel management.  She reports some strong odor from her urine but does not endorse fever, chills or other symptoms of UTI .   Treatment Plan Summary: Daily contact with patient to assess and  evaluate symptoms and progress in treatment, Medication management, Plan ongoing inpatient treatment and medication management as below  Continue Depakote ER 750 mgrs QHS , for mood disorder,- increased yesterday due to low serum level. Will need to check level in 5 days.  Continue Seroquel 200 mgrs QHS , To address mood disorder  Ativan 0.5 mgrs TID for management of agitation and mood disorder.  Valproic acid level, Comprehensive metabolic panel on 62/56/38 Possible discharge on 09/10/14.  Medical Decision Making:  Established Problem, Stable/Improving (1), Review or order clinical lab tests (1) and Review of Medication Regimen & Side Effects (2)   Corena Pilgrim, MD 09/09/2014, 12:58 PM

## 2014-09-09 NOTE — Progress Notes (Signed)
Pt had a cbg at 2100 of 71   She was encouraged to eat a snack before bedtime and no insulin was given according to order parameters

## 2014-09-09 NOTE — Progress Notes (Signed)
Adult Psychoeducational Group Note  Date:  09/09/2014 Time:  9:26 PM  Group Topic/Focus:  Wrap-Up Group:   The focus of this group is to help patients review their daily goal of treatment and discuss progress on daily workbooks.  Participation Level:  Active  Participation Quality:  Appropriate  Affect:  Appropriate  Cognitive:  Appropriate  Insight: Appropriate  Engagement in Group:  Engaged  Modes of Intervention:  Discussion  Additional Comments: The patient expressed that she attended group today.The patient also attended wrap up group to review the rules at [facility].  Nash Shearer 09/09/2014, 9:26 PM

## 2014-09-09 NOTE — Progress Notes (Signed)
Patient ID: Kelsey Hanna, female   DOB: 02-02-1951, 64 y.o.   MRN: [id number]   D: pt has been very intrusive on the unit, patient has also been very demanding. Pt has requested help continuously, and gets upset when she does not have her way. 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 Several times on the unit today, staff has had to involve the [facility] due to the patient being demanding. Pt reported that her depression was a 8, her hopelessness was a 5, and her anxiety was a 10. Pt reported that her goal was to get a motorized wheelchair. Pt reported being negative SI/HI, no AH/VH noted. A: 15 min checks continued for patient safety. R: Pt safety maintained.

## 2014-09-09 NOTE — Progress Notes (Signed)
Patient ID: Kelsey Hanna, female   DOB: 03/14/1950, 64 y.o.   MRN: [id number]  D: Patient reports she will be discharged tomorrow. Some irritability tonight and her usual demanding demeanor. She may reacting to the loud milieu tonight. Reports feeling better and denies any SI/HI or a/v hallucinations. A: Staff will monitor on q 15 minute checks, follow treatment plan, and give meds as ordered. R: Cooperative on the unit.

## 2014-09-09 NOTE — [facility] Group Notes (Signed)
[facility] Group Notes:  (Clinical Social Work)  09/09/2014  11:15-12:00PM  Summary of Progress/Problems:   The main focus of today's process group was to discuss patients' feelings related to being hospitalized, as well as the difference between "being" and "having" a mental health diagnosis.  It was agreed in general by the group that it would be preferable to avoid future hospitalizations, and we discussed means of doing that.  As a follow-up, problems with adhering to medication recommendations were discussed.  The patient expressed their primary feeling about being hospitalized is that she is getting the best treatment available.  This came just after patient was very irritated with clinician for not turning back on the TV while people were coming into the room for group, at least until group actually started.  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  Type of Therapy:  Group Therapy - Process  Participation Level:  Active  Participation Quality:  Appropriate  Affect:  Blunted and Irritable  Cognitive:  Disorganized  Insight:  Developing/Improving  Engagement in Therapy:  Engaged  Modes of Intervention:  Exploration, Discussion  Selmer Dominion, LCSW 09/09/2014, 1:09 PM

## 2014-09-09 NOTE — Progress Notes (Signed)
Patient ID: Kelsey Hanna, female   DOB: February 12, 1951, 64 y.o.   MRN: [id number]  Pt's blood sugar was checked at lunch it was 61, hypoglycemic protocol initiated. Pt was given a tube of glucose, for which she only took half of the tube. Will recheck blood sugar when patient back form lunch.

## 2014-09-10 LAB — COMPREHENSIVE METABOLIC PANEL
ALK PHOS: 66 U/L (ref 38–126)
ALT: 28 U/L (ref 14–54)
AST: 22 U/L (ref 15–41)
Albumin: 3.8 g/dL (ref 3.5–5.0)
Anion gap: 7 (ref 5–15)
BUN: 16 mg/dL (ref 6–20)
CALCIUM: 9.4 mg/dL (ref 8.9–10.3)
CO2: 28 mmol/L (ref 22–32)
CREATININE: 0.85 mg/dL (ref 0.44–1.00)
Chloride: 105 mmol/L (ref 101–111)
GFR calc Af Amer: >60 mL/min (ref >60)
GFR calc non Af Amer: >60 mL/min (ref >60)
Glucose, Bld: 92 mg/dL (ref 65–99)
Potassium: 4.5 mmol/L (ref 3.5–5.1)
SODIUM: 140 mmol/L (ref 135–145)
Total Bilirubin: 0.5 mg/dL (ref 0.3–1.2)
Total Protein: 6.5 g/dL (ref 6.5–8.1)

## 2014-09-10 LAB — URINE CULTURE: Special Requests: NORMAL

## 2014-09-10 LAB — VALPROIC ACID LEVEL: VALPROIC ACID LVL: 40 ug/mL — AB (ref 50.0–100.0)

## 2014-09-10 LAB — GLUCOSE, CAPILLARY
GLUCOSE-CAPILLARY: 91 mg/dL (ref 65–99)
GLUCOSE-CAPILLARY: 90 mg/dL (ref 65–99)
Glucose-Capillary: 86 mg/dL (ref 65–99)
Glucose-Capillary: 128 mg/dL — ABNORMAL HIGH (ref 65–99)

## 2014-09-10 MED ORDER — METFORMIN HCL 500 MG PO TABS
500.0000 mg | ORAL_TABLET | Freq: Every day | ORAL | Status: DC
  Administered 2014-09-11: 500 mg via ORAL
  Filled 2014-09-10 (×5): qty 1

## 2014-09-10 MED ORDER — METOPROLOL TARTRATE 50 MG PO TABS
50.0000 mg | ORAL_TABLET | Freq: Every day | ORAL | Status: DC
  Administered 2014-09-10: 50 mg via ORAL
  Filled 2014-09-10 (×4): qty 1
  Filled 2014-09-10: qty 2

## 2014-09-10 NOTE — Progress Notes (Signed)
D. Pt has been intrusive in the unit. Pt has been demanding to speak to DON and got mad if her demand was not met. Pt had some episode of confusion and forgetfulness. Pt had a good night sleep last night after taking sleep medication. Pt said her depression today is 2/10, her hopelessness today is 0/10, her anxiety is 10/10, pt has no withdrawal, denies SI/HI. Pt reported 10/10 pain. Pt reported her goal for today is "taking care of my affairs". A. Pt medicated as ordered, 13min checks maintained for safety. R. Pt safety maintained.

## 2014-09-10 NOTE — Progress Notes (Signed)
Pt continues to snore softly her breathing is normal and unlabored  Pt skin is warm and dry   No distress noted

## 2014-09-10 NOTE — Progress Notes (Signed)
Patient ID: Kelsey Hanna, female   DOB: 09-Nov-1950, 64 y.o.   MRN: [id number]  Pt for the last two days has been having low blood sugars and low blood pressures, May NP made aware orders noted to change blood pressure medications and blood sugar medication to once a day.

## 2014-09-10 NOTE — Progress Notes (Signed)
Adult Psychoeducational Group Note  Date:  09/10/2014 Time:  9:37 PM  Group Topic/Focus:  Wrap-Up Group:   The focus of this group is to help patients review their daily goal of treatment and discuss progress on daily workbooks.  Participation Level:  Active  Participation Quality:  Appropriate  Affect:  Appropriate  Cognitive:  Appropriate  Insight: Appropriate  Engagement in Group:  Engaged  Modes of Intervention:  Discussion  Additional Comments:  The patient attended group.  Nash Shearer 09/10/2014, 9:37 PM

## 2014-09-10 NOTE — Progress Notes (Signed)
[facility] Progress Note  09/10/2014 5:51 PM Kelsey Hanna  MRN:  [id number] Subjective: " I wan to know when I can go home.''   Objective : Patient seen, interviewed, chart reviewed and case discussed with the treatment team.  Patient is visible in milieu and appears to be less intrusive.  Per her primary nurse, patient is sleeping better, a more organized thought process and mood stability.  She denies HI/SI, AVH or delusional thinking. Patient has been compliant with her medications and no adverse drug reactions noted.  She participated in groups and no disruptive behaviors noted.  She is looking forward to being discharged home tomorrow. Lab reviewed with the patient: Valproic Acid below therapeutic levels - 39. HgbA1C 5.9. Lipid panel unremarkable .  Principal Problem: Bipolar affective disorder, current episode manic without psychotic symptoms Diagnosis:   Patient Active Problem List   Diagnosis Date Noted  . Bipolar affective disorder, current episode manic without psychotic symptoms [F31.10] 09/05/2014  . Bipolar affective disorder, current episode manic with psychotic symptoms [F31.2] 09/05/2014  . Bipolar affective disorder, manic [F31.10] 09/03/2014  . Encounter for preadmission testing [Z01.818]   . PNA (pneumonia) [J18.9] 04/27/2014  . Type 2 diabetes mellitus [E11.9] 04/27/2014  . Essential hypertension [I10] 04/27/2014  . Hypothyroidism [E03.9] 04/27/2014  . Tobacco abuse [Z72.0] 04/27/2014  . Bipolar I disorder, most recent episode (or current) unspecified [F31.9] 04/20/2014  . Acute encephalopathy [G93.40] 04/18/2014  . Unresponsiveness [R40.4]   . Acute respiratory failure [J96.00]   . Encounter for feeding tube placement [Z87.898]   . Rapid atrial fibrillation [I48.91]   . Shortness of breath [R06.02]    Total Time spent with patient: 25 minutes    Past Medical History:  Past Medical History  Diagnosis Date  . Bipolar 1 disorder   . Hypothyroid   . Dyslipidemia    . Retinal detachment   . Sleep apnea   . Diabetes mellitus     dx within last yr....just takes pills  . Cancer     cancer of uterus...no chemo or radiation    Past Surgical History  Procedure Laterality Date  . Abdominal hysterectomy    . Total abdominal hysterectomy w/ bilateral salpingoophorectomy    . Tonsillectomy    . Lumbar laminectomy/decompression microdiscectomy  07/17/2011    Procedure: LUMBAR LAMINECTOMY/DECOMPRESSION MICRODISCECTOMY;  Surgeon: Sinclair Ship, MD;  Location: [place];  Service: Orthopedics;  Laterality: Left;  Left sided lumbar 4-5 microdisectomy   Family History: History reviewed. No pertinent family history. Social History:  History  Alcohol Use No    Comment: socially     History  Drug Use No    History   Social History  . Marital Status: Divorced    Spouse Name: N/A  . Number of Children: N/A  . Years of Education: N/A   Social History Main Topics  . Smoking status: Current Every Day Smoker -- 0.25 packs/day for 35 years    Types: Cigarettes    Last Attempt to Quit: 07/09/1999  . Smokeless tobacco: Not on file  . Alcohol Use: No     Comment: socially  . Drug Use: No  . Sexual Activity: Not on file   Other Topics Concern  . None   Social History Narrative   Additional History:    Sleep: good  Appetite:  good   Assessment:   Musculoskeletal: Strength & Muscle Tone: within normal limits Gait & Station: normal Patient leans: N/A   Psychiatric Specialty Exam: Physical Exam  Vitals reviewed.   ROS denies SOB at rest , states urine has " strong odor", but does not endorse dysuria.  Blood pressure 142/76, pulse 105, temperature 97.8 F (36.6 C), temperature source Oral, resp. rate 18, height $RemoveBe'5\' 1"'FxXhUCttn$  (1.549 m), weight 74.844 kg (165 lb).Body mass index is 31.19 kg/(m^2).  General Appearance: Fairly Groomed  Engineer, water::  Good  Speech:  Clear and Coherent and Normal Rate  Volume:  Normal  Mood:  Euphoric  Affect:   Appropriate and less expanisve   Thought Process:  Goal Directed and Linear  Orientation:  Full (Time, Place, and Person)  Thought Content:  Negative and denies hallucinations, no delusions expresed today  Suicidal Thoughts:  No  Homicidal Thoughts:  No  Memory:  Immediate;   Fair Recent;   Fair Remote;   Fair  Judgement:  Fair  Insight:  Shallow  Psychomotor Activity:  Normal  Concentration:  Fair  Recall:  Good  Fund of Knowledge:Good  Language: Good  Akathisia:  Negative  Handed:  Right  AIMS (if indicated):     Assets:  Desire for Improvement Resilience  ADL's:  Impaired  Cognition: WNL  Sleep:  Number of Hours: 5.75     Current Medications: Current Facility-Administered Medications  Medication Dose Route Frequency Provider Last Rate Last Dose  . acetaminophen (TYLENOL) tablet 650 mg  650 mg Oral Q6H PRN Patrecia Pour, NP      . alum & mag hydroxide-simeth (MAALOX/MYLANTA) 200-200-20 MG/5ML suspension 30 mL  30 mL Oral Q4H PRN Patrecia Pour, NP      . aspirin EC tablet 325 mg  325 mg Oral Daily Patrecia Pour, NP   325 mg at 09/10/14 5170  . atorvastatin (LIPITOR) tablet 40 mg  40 mg Oral QPM Patrecia Pour, NP   40 mg at 09/10/14 1645  . colchicine tablet 0.6 mg  0.6 mg Oral Daily Patrecia Pour, NP   0.6 mg at 09/10/14 0174  . cyclobenzaprine (FLEXERIL) tablet 10 mg  10 mg Oral TID PRN Patrecia Pour, NP   10 mg at 09/10/14 1105  . diclofenac sodium (VOLTAREN) 1 % transdermal gel 2 g  2 g Topical Daily Laverle Hobby, PA-C   2 g at 09/10/14 9449  . divalproex (DEPAKOTE ER) 24 hr tablet 750 mg  750 mg Oral QHS Jenne Campus, MD   750 mg at 09/09/14 2106  . fluticasone (FLONASE) 50 MCG/ACT nasal spray 2 spray  2 spray Each Nare BID Patrecia Pour, NP   2 spray at 09/10/14 1647  . ibuprofen (ADVIL,MOTRIN) tablet 600 mg  600 mg Oral Q6H PRN Patrecia Pour, NP   600 mg at 09/10/14 1710  . insulin aspart (novoLOG) injection 0-20 Units  0-20 Units Subcutaneous TID WC  Laverle Hobby, PA-C   3 Units at 09/06/14 1722  . insulin aspart (novoLOG) injection 0-5 Units  0-5 Units Subcutaneous QHS Laverle Hobby, PA-C   0 Units at 09/05/14 2200  . levothyroxine (SYNTHROID, LEVOTHROID) tablet 100 mcg  100 mcg Oral QAC breakfast Ursula Alert, MD   100 mcg at 09/10/14 0608  . lisinopril (PRINIVIL,ZESTRIL) tablet 5 mg  5 mg Oral Daily Kerrie Buffalo, NP   5 mg at 09/10/14 1646  . LORazepam (ATIVAN) tablet 0.5 mg  0.5 mg Oral Q6H PRN Jenne Campus, MD   0.5 mg at 09/09/14 2310  . LORazepam (ATIVAN) tablet 0.5 mg  0.5 mg Oral TID  Jenne Campus, MD   0.5 mg at 09/10/14 1646  . magnesium hydroxide (MILK OF MAGNESIA) suspension 30 mL  30 mL Oral Daily PRN Patrecia Pour, NP      . meclizine (ANTIVERT) tablet 12.5 mg  12.5 mg Oral TID PRN Patrecia Pour, NP      . Derrill Memo ON 09/11/2014] metFORMIN (GLUCOPHAGE) tablet 500 mg  500 mg Oral Q breakfast Kerrie Buffalo, NP      . Derrill Memo ON 09/11/2014] metoprolol (LOPRESSOR) tablet 50 mg  50 mg Oral QAC breakfast Kerrie Buffalo, NP   50 mg at 09/10/14 1646  . nicotine polacrilex (NICORETTE) gum 2 mg  2 mg Oral PRN Ursula Alert, MD      . potassium chloride (K-DUR) CR tablet 20 mEq  20 mEq Oral Daily Patrecia Pour, NP   20 mEq at 09/10/14 2458  . QUEtiapine (SEROQUEL) tablet 200 mg  200 mg Oral QHS Jenne Campus, MD   200 mg at 09/09/14 2106  . traMADol (ULTRAM) tablet 100 mg  100 mg Oral Q6H PRN Simbiso Ranga, MD   100 mg at 09/10/14 1105  . zolpidem (AMBIEN) tablet 5 mg  5 mg Oral QHS PRN Nat Math, MD   5 mg at 09/09/14 2311    Lab Results:  Results for orders placed or performed during the hospital encounter of 09/05/14 (from the past 48 hour(s))  Urinalysis with microscopic     Status: Abnormal   Collection Time: 09/08/14  7:28 PM  Result Value Ref Range   Color, Urine AMBER (A) YELLOW    Comment: BIOCHEMICALS MAY BE AFFECTED BY COLOR   APPearance CLEAR CLEAR   Specific Gravity, Urine 1.027 1.005 - 1.030   pH  5.5 5.0 - 8.0   Glucose, UA NEGATIVE NEGATIVE mg/dL   Hgb urine dipstick NEGATIVE NEGATIVE   Bilirubin Urine NEGATIVE NEGATIVE   Ketones, ur NEGATIVE NEGATIVE mg/dL   Protein, ur NEGATIVE NEGATIVE mg/dL   Urobilinogen, UA 0.2 0.0 - 1.0 mg/dL   Nitrite NEGATIVE NEGATIVE   Leukocytes, UA SMALL (A) NEGATIVE   WBC, UA 3-6 <3 WBC/hpf   Bacteria, UA FEW (A) RARE   Squamous Epithelial / LPF FEW (A) RARE   Urine-Other MUCOUS PRESENT     Comment: Performed at [facility]  Urine culture     Status: None   Collection Time: 09/08/14  7:28 PM  Result Value Ref Range   Specimen Description      URINE, CLEAN CATCH Performed at [facility]    Special Requests      Normal Performed at [place], SUGGEST RECOLLECTION Performed at [facility]    Report Status 09/10/2014 FINAL   Glucose, capillary     Status: None   Collection Time: 09/08/14  8:31 PM  Result Value Ref Range   Glucose-Capillary 98 65 - 99 mg/dL  Glucose, capillary     Status: None   Collection Time: 09/09/14  6:14 AM  Result Value Ref Range   Glucose-Capillary 77 65 - 99 mg/dL  Glucose, capillary     Status: Abnormal   Collection Time: 09/09/14 12:12 PM  Result Value Ref Range   Glucose-Capillary 61 (L) 65 - 99 mg/dL  Glucose, capillary     Status: Abnormal   Collection Time: 09/09/14  1:02 PM  Result Value Ref Range   Glucose-Capillary 137 (H) 65 -  99 mg/dL  Glucose, capillary     Status: None   Collection Time: 09/09/14  4:43 PM  Result Value Ref Range   Glucose-Capillary 75 65 - 99 mg/dL  Glucose, capillary     Status: None   Collection Time: 09/09/14  9:03 PM  Result Value Ref Range   Glucose-Capillary 71 65 - 99 mg/dL  Glucose, capillary     Status: None   Collection Time: 09/10/14  6:03 AM  Result Value Ref Range   Glucose-Capillary 91 65 - 99 mg/dL  Glucose, capillary     Status: None   Collection  Time: 09/10/14 12:05 PM  Result Value Ref Range   Glucose-Capillary 86 65 - 99 mg/dL  Glucose, capillary     Status: None   Collection Time: 09/10/14  4:36 PM  Result Value Ref Range   Glucose-Capillary 90 65 - 99 mg/dL    Physical Findings: AIMS: Facial and Oral Movements Muscles of Facial Expression: None, normal Lips and Perioral Area: None, normal Jaw: None, normal Tongue: None, normal,Extremity Movements Upper (arms, wrists, hands, fingers): None, normal Lower (legs, knees, ankles, toes): None, normal, Trunk Movements Neck, shoulders, hips: None, normal, Overall Severity Severity of abnormal movements (highest score from questions above): None, normal Incapacitation due to abnormal movements: None, normal Patient's awareness of abnormal movements (rate only patient's report): No Awareness, Dental Status Current problems with teeth and/or dentures?: No Does patient usually wear dentures?: No  CIWA:    COWS:      Assessment- patient remains  Less manic but still hyper verbal, has  tendency to be intrusive, disorganized. However, there is some improvement compared to her admission presentation and she does not seem quite as manic or elated today. She is tolerating medications well- denies side effects from medications . Valproic Acid Serum level low . She is reporting significant difficulties getting her needs met at home due to her limited support network. Of note, patient states she  Prefers lithium over Seroquel, which she states has been more effective in the past. However, lithium is currently listed as a medication patient is allergic to- patient unsure why. However, based on potential for serious side effects, particularly if allergic , will avoid this medication at present and continue Seroquel management.  She reports some strong odor from her urine but does not endorse fever, chills or other symptoms of UTI .   Treatment Plan Summary: Daily contact with patient to assess  and evaluate symptoms and progress in treatment, Medication management, Plan ongoing inpatient treatment and medication management as below  Continue Depakote ER 750 mgrs QHS , for mood disorder,- increased yesterday due to low serum level. Will need to check level in 5 days.  Continue Seroquel 200 mgrs QHS , To address mood disorder  Ativan 0.5 mgrs TID for management of agitation and mood disorder.  Metroprolol, Lisinopril and Glucotrol decrease frequency to once daily.  BP were low and also glucose check at bedside.  Medical Decision Making:  Established Problem, Stable/Improving (1), Review or order clinical lab tests (1) and Review of Medication Regimen & Side Effects (2)   Pierson, AGNP-BC 09/10/2014, 5:51 PM Patient seen face-to-face for psychiatric evaluation, chart reviewed and case discussed with the physician extender and developed treatment plan. Reviewed the information documented and agree with the treatment plan. Corena Pilgrim, MD

## 2014-09-10 NOTE — BHH Group Notes (Signed)
[place] Group Notes:  (Clinical Social Work)  09/10/2014  [place] Group Notes:  (Clinical Social Work)  09/10/2014  11:00AM-12:00PM  Summary of Progress/Problems:  The main focus of today's process group was to listen to a variety of genres of music and to identify that different types of music provoke different responses.  The patient then was able to identify personally what was soothing for them, as well as energizing.    The patient expressed understanding of concepts, as well as knowledge of how each type of music affected her and how this can be used at home as a wellness/recovery tool.  She danced a great deal, sang along with the music, and interacted regardless of whether the music was familiar to her or not.  She later explained to CSW that the reason she dances like she does is because she used to take dance classes and she sometimes acts in a theatre company.  Type of Therapy:  Music Therapy   Participation Level:  Active  Participation Quality:  Attentive   Affect:  Blunted  Cognitive:  Disorganized  Insight:  Improving  Engagement in Therapy:  Engaged  Modes of Intervention:   Activity, Exploration  Selmer Dominion, LCSW 09/10/2014

## 2014-09-10 NOTE — Progress Notes (Signed)
D   Pt is mildly confused and is having difficulty with her memory   She has to be shown where her room is    She can be irritable and has disorganized thoughts   Pt has poor boundaries  A   Verbal support given   Medications administered and effectiveness monitored   Q 15 min checks   Redirect as needed R   Pt safe at present

## 2014-09-11 DIAGNOSIS — F3113 Bipolar disorder, current episode manic without psychotic features, severe: Secondary | ICD-10-CM | POA: Insufficient documentation

## 2014-09-11 LAB — GLUCOSE, CAPILLARY
GLUCOSE-CAPILLARY: 105 mg/dL — AB (ref 65–99)
GLUCOSE-CAPILLARY: 103 mg/dL — AB (ref 65–99)
Glucose-Capillary: 112 mg/dL — ABNORMAL HIGH (ref 65–99)
Glucose-Capillary: 109 mg/dL — ABNORMAL HIGH (ref 65–99)

## 2014-09-11 MED ORDER — LORAZEPAM 0.5 MG PO TABS
0.5000 mg | ORAL_TABLET | Freq: Two times a day (BID) | ORAL | Status: DC
  Administered 2014-09-12: 0.5 mg via ORAL
  Filled 2014-09-11 (×2): qty 1

## 2014-09-11 MED ORDER — DIVALPROEX SODIUM ER 500 MG PO TB24
1000.0000 mg | ORAL_TABLET | Freq: Every day | ORAL | Status: DC
  Administered 2014-09-11: 1000 mg via ORAL
  Filled 2014-09-11: qty 6
  Filled 2014-09-11 (×4): qty 2

## 2014-09-11 NOTE — BHH Group Notes (Signed)
[facility] LCSW Aftercare Discharge Planning Group Note   09/11/2014 2:50 PM  Participation Quality:  Engaged  Mood/Affect:  Appropriate  Depression Rating:  5  Anxiety Rating:  8  Thoughts of Suicide:  No Will you contract for safety?   NA  Current AVH:  No  Plan for Discharge/Comments:  "I feel like I am wired to work an 11P to 7A shift."  C/O lack of sleep.  "I'm only getting 3-4 hours."  States that it feels like she cannot slow her mind down, and also that her pain is not allowing her to sleep.   Transportation Means:   Supports:  Roque Lias B

## 2014-09-11 NOTE — Progress Notes (Signed)
[place] Group Notes:  (Nursing/MHT/Case Management/Adjunct)  Date:  09/11/2014  Time:  11:09 PM  Type of Therapy:  Psychoeducational Skills  Participation Level:  Active  Participation Quality:  Attentive and Monopolizing  Affect:  Appropriate  Cognitive:  Appropriate  Insight:  Improving  Engagement in Group:  Improving  Modes of Intervention:  Education  Summary of Progress/Problems: The patient spoke at great length about her day in group this evening. The patient mentioned that she had a very positive visit with her daughter this evening. She states that she has lived with her daughter for the past two years and that she pays her $700/month to live there while she only collects $738/month in benefits.Patient eluded to the fact that her daughter is doing quite well financially and that she should not have to pay to live with her.  In addition, she also touched upon the issue that she normally does not get along with her daughter and that she was able to express her illness this evening to her. She feels that her daughter expressed more compassion towards this evening as she normally does not come across in that manner. In terms of the theme for the day, the patient indicated that her wellness regimen will include bathing more independently (taking a shower versus a "bird bath") rather than relying upon the C.N.A. At her home who only allows her fifteen minutes to complete the bathing process. She understands that she can become light headed but can hold on to the shower so as not to fall.   Suhaib Guzzo S 09/11/2014, 11:09 PM

## 2014-09-11 NOTE — Plan of Care (Signed)
Problem: Alteration in mood Goal: LTG-Patient reports reduction in suicidal thoughts (Patient reports reduction in suicidal thoughts and is able to verbalize a safety plan for whenever patient is feeling suicidal)  Outcome: Progressing Pt denies SI at time when assessed. Verbally contracted to approach staff if / when she does have urge to harm self, to promote safety. Safety maintained on Q 15 minutes checks as ordered without gestures or incident of self harm to note at present.

## 2014-09-11 NOTE — Progress Notes (Signed)
D   Pt is mildly confused and is having difficulty with her memory   She has to be shown where her room is    She can be irritable and has disorganized thoughts   Pt has poor boundaries  A   Verbal support given   Medications administered and effectiveness monitored   Q 15 min checks   Redirect as needed R   Pt safe at present

## 2014-09-11 NOTE — Progress Notes (Signed)
Patient ID: Kelsey Hanna, female   DOB: Oct 28, 1950, 64 y.o.   MRN: [id number] [facility] Progress Note  09/11/2014 4:53 PM Kelsey Hanna  MRN:  [id number] Subjective:  Patient states she is doing well and is hoping to go home soon. Denies medication side effects.  Objective :  I have discussed case with treatment team and have met with patient. She is currently reporting much improvement compared to admission. She is hoping for discharge soon.  She denies any depression at this time and currently presents pleasant and euthymic. Of note, staff reports that patient seems confused at times, particularly at night time. At this time patient is oriented x 3, and does not appear confused or delirious. She does state " my memory is not what it used to be ", and her recall is 3/3 immediate and 1/3 at 3 minutes with cues . She is aware of disposition planning issues, and states she is looking forward to returning home, and states she is happy that she will have ACT team support, and that she plans to continue getting her meals through meals on wheels type program. Denies medication side effects. Reports chronic pain on L foot , following bunion surgery. She report she takes opiate at home ( morphine ? )- at this time is not on opiate medications and does not currently appear uncomfortable or in any acute distress. We have reviewed potential risks of opiate use, to include addictive properties as well as potential for excessive sedation and increased cognitive issues . She reports her sleep remains poor- states " I have tried everything, the only thing that works is Ambien".  BMP unremarkable, Valproic Acid level subtherapeutic - 40 .    Principal Problem: Bipolar affective disorder, current episode manic without psychotic symptoms Diagnosis:   Patient Active Problem List   Diagnosis Date Noted  . Bipolar affective disorder, current episode manic without psychotic symptoms [F31.10] 09/05/2014  . Bipolar  affective disorder, current episode manic with psychotic symptoms [F31.2] 09/05/2014  . Bipolar affective disorder, manic [F31.10] 09/03/2014  . Encounter for preadmission testing [Z01.818]   . PNA (pneumonia) [J18.9] 04/27/2014  . Type 2 diabetes mellitus [E11.9] 04/27/2014  . Essential hypertension [I10] 04/27/2014  . Hypothyroidism [E03.9] 04/27/2014  . Tobacco abuse [Z72.0] 04/27/2014  . Bipolar I disorder, most recent episode (or current) unspecified [F31.9] 04/20/2014  . Acute encephalopathy [G93.40] 04/18/2014  . Unresponsiveness [R40.4]   . Acute respiratory failure [J96.00]   . Encounter for feeding tube placement [Z87.898]   . Rapid atrial fibrillation [I48.91]   . Shortness of breath [R06.02]    Total Time spent with patient: 25 minutes    Past Medical History:  Past Medical History  Diagnosis Date  . Bipolar 1 disorder   . Hypothyroid   . Dyslipidemia   . Retinal detachment   . Sleep apnea   . Diabetes mellitus     dx within last yr....just takes pills  . Cancer     cancer of uterus...no chemo or radiation    Past Surgical History  Procedure Laterality Date  . Abdominal hysterectomy    . Total abdominal hysterectomy w/ bilateral salpingoophorectomy    . Tonsillectomy    . Lumbar laminectomy/decompression microdiscectomy  07/17/2011    Procedure: LUMBAR LAMINECTOMY/DECOMPRESSION MICRODISCECTOMY;  Surgeon: Emilee Hero, MD;  Location: [facility] OR;  Service: Orthopedics;  Laterality: Left;  Left sided lumbar 4-5 microdisectomy   Family History: History reviewed. No pertinent family history. Social History:  History  Alcohol Use No    Comment: socially     History  Drug Use No    History   Social History  . Marital Status: Divorced    Spouse Name: N/A  . Number of Children: N/A  . Years of Education: N/A   Social History Main Topics  . Smoking status: Current Every Day Smoker -- 0.25 packs/day for 35 years    Types: Cigarettes    Last Attempt to  Quit: 07/09/1999  . Smokeless tobacco: Not on file  . Alcohol Use: No     Comment: socially  . Drug Use: No  . Sexual Activity: Not on file   Other Topics Concern  . None   Social History Narrative   Additional History:    Sleep:  Remains fair   Appetite:  good   Assessment:   Musculoskeletal: Strength & Muscle Tone: within normal limits Gait & Station: normal Patient leans: N/A   Psychiatric Specialty Exam: Physical Exam  Vitals reviewed.   ROS  No SOB, no chest pain, L foot pain  Blood pressure 116/65, pulse 86, temperature 97.7 F (36.5 C), temperature source Oral, resp. rate 18, height $RemoveBe'5\' 1"'AFmUSYNyM$  (1.549 m), weight 165 lb (74.844 kg).Body mass index is 31.19 kg/(m^2).  General Appearance: Fairly Groomed, but improved compared to admission  Eye Contact::  Good  Speech:  Clear and Coherent and Normal Rate  Volume:  Normal  Mood:  Euthymic  Affect:   Less expansive, not irritable, reactive, pleasant  Thought Process:  Goal Directed and Linear  Orientation:  Full (Time, Place, and Person)  Thought Content:  Negative and denies hallucinations, no delusions expresed today  Suicidal Thoughts:  No denies any thoughts of SI or any self injurious ideations  Homicidal Thoughts:  No  Memory: recall is 3/3 immediate and 1/3 at 3 minutes with cues   Judgement:  Fair  Insight:  Shallow  Psychomotor Activity:  Normal- not restless or agitated at this time  Concentration:  Fair  Recall:  Good  Fund of Knowledge:Good  Language: Good  Akathisia:  Negative  Handed:  Right  AIMS (if indicated):     Assets:  Desire for Improvement Resilience  ADL's:  Impaired  Cognition: WNL  Sleep:  Number of Hours: 3.5     Current Medications: Current Facility-Administered Medications  Medication Dose Route Frequency Provider Last Rate Last Dose  . acetaminophen (TYLENOL) tablet 650 mg  650 mg Oral Q6H PRN Patrecia Pour, NP      . alum & mag hydroxide-simeth (MAALOX/MYLANTA) 200-200-20  MG/5ML suspension 30 mL  30 mL Oral Q4H PRN Patrecia Pour, NP      . aspirin EC tablet 325 mg  325 mg Oral Daily Patrecia Pour, NP   325 mg at 09/11/14 0859  . atorvastatin (LIPITOR) tablet 40 mg  40 mg Oral QPM Patrecia Pour, NP   40 mg at 09/10/14 1645  . colchicine tablet 0.6 mg  0.6 mg Oral Daily Patrecia Pour, NP   0.6 mg at 09/11/14 0859  . cyclobenzaprine (FLEXERIL) tablet 10 mg  10 mg Oral TID PRN Patrecia Pour, NP   10 mg at 09/11/14 0956  . diclofenac sodium (VOLTAREN) 1 % transdermal gel 2 g  2 g Topical Daily Laverle Hobby, PA-C   2 g at 09/11/14 0854  . divalproex (DEPAKOTE ER) 24 hr tablet 750 mg  750 mg Oral QHS Myer Peer Cobos, MD   750 mg at  09/10/14 2203  . fluticasone (FLONASE) 50 MCG/ACT nasal spray 2 spray  2 spray Each Nare BID Charm Rings, NP   2 spray at 09/11/14 [phone]  . ibuprofen (ADVIL,MOTRIN) tablet 600 mg  600 mg Oral Q6H PRN Charm Rings, NP   600 mg at 09/11/14 0953  . insulin aspart (novoLOG) injection 0-20 Units  0-20 Units Subcutaneous TID WC Kerry Hough, PA-C   3 Units at 09/06/14 1722  . insulin aspart (novoLOG) injection 0-5 Units  0-5 Units Subcutaneous QHS Kerry Hough, PA-C   0 Units at 09/05/14 2200  . levothyroxine (SYNTHROID, LEVOTHROID) tablet 100 mcg  100 mcg Oral QAC breakfast Jomarie Longs, MD   100 mcg at 09/11/14 0612  . lisinopril (PRINIVIL,ZESTRIL) tablet 5 mg  5 mg Oral Daily Adonis Brook, NP   5 mg at 09/11/14 0859  . LORazepam (ATIVAN) tablet 0.5 mg  0.5 mg Oral Q6H PRN Craige Cotta, MD   0.5 mg at 09/09/14 2310  . LORazepam (ATIVAN) tablet 0.5 mg  0.5 mg Oral TID Craige Cotta, MD   0.5 mg at 09/11/14 1257  . magnesium hydroxide (MILK OF MAGNESIA) suspension 30 mL  30 mL Oral Daily PRN Charm Rings, NP      . meclizine (ANTIVERT) tablet 12.5 mg  12.5 mg Oral TID PRN Charm Rings, NP      . metFORMIN (GLUCOPHAGE) tablet 500 mg  500 mg Oral Q breakfast Adonis Brook, NP   500 mg at 09/11/14 0859  . metoprolol  (LOPRESSOR) tablet 50 mg  50 mg Oral QAC breakfast Adonis Brook, NP   50 mg at 09/10/14 1646  . nicotine polacrilex (NICORETTE) gum 2 mg  2 mg Oral PRN Jomarie Longs, MD      . potassium chloride (K-DUR) CR tablet 20 mEq  20 mEq Oral Daily Charm Rings, NP   20 mEq at 09/11/14 0859  . QUEtiapine (SEROQUEL) tablet 200 mg  200 mg Oral QHS Craige Cotta, MD   200 mg at 09/10/14 2203  . traMADol (ULTRAM) tablet 100 mg  100 mg Oral Q6H PRN Simbiso Ranga, MD   100 mg at 09/10/14 2203  . zolpidem (AMBIEN) tablet 5 mg  5 mg Oral QHS PRN Conley Canal, MD   5 mg at 09/11/14 0029    Lab Results:  Results for orders placed or performed during the hospital encounter of 09/05/14 (from the past 48 hour(s))  Glucose, capillary     Status: None   Collection Time: 09/09/14  9:03 PM  Result Value Ref Range   Glucose-Capillary 71 65 - 99 mg/dL  Glucose, capillary     Status: None   Collection Time: 09/10/14  6:03 AM  Result Value Ref Range   Glucose-Capillary 91 65 - 99 mg/dL  Glucose, capillary     Status: None   Collection Time: 09/10/14 12:05 PM  Result Value Ref Range   Glucose-Capillary 86 65 - 99 mg/dL  Glucose, capillary     Status: None   Collection Time: 09/10/14  4:36 PM  Result Value Ref Range   Glucose-Capillary 90 65 - 99 mg/dL  Comprehensive metabolic panel     Status: None   Collection Time: 09/10/14  7:41 PM  Result Value Ref Range   Sodium 140 135 - 145 mmol/L   Potassium 4.5 3.5 - 5.1 mmol/L   Chloride 105 101 - 111 mmol/L   CO2 28 22 - 32 mmol/L  Glucose, Bld 92 65 - 99 mg/dL   BUN 16 6 - 20 mg/dL   Creatinine, Ser 0.85 0.44 - 1.00 mg/dL   Calcium 9.4 8.9 - 10.3 mg/dL   Total Protein 6.5 6.5 - 8.1 g/dL   Albumin 3.8 3.5 - 5.0 g/dL   AST 22 15 - 41 U/L   ALT 28 14 - 54 U/L   Alkaline Phosphatase 66 38 - 126 U/L   Total Bilirubin 0.5 0.3 - 1.2 mg/dL   GFR calc non Af Amer >60 >60 mL/min   GFR calc Af Amer >60 >60 mL/min    Comment: (NOTE) The eGFR has been  calculated using the CKD EPI equation. This calculation has not been validated in all clinical situations. eGFR's persistently <60 mL/min signify possible Chronic Kidney Disease.    Anion gap 7 5 - 15    Comment: Performed at [facility]  Valproic acid level     Status: Abnormal   Collection Time: 09/10/14  7:41 PM  Result Value Ref Range   Valproic Acid Lvl 40 (L) 50.0 - 100.0 ug/mL    Comment: Performed at [facility]  Glucose, capillary     Status: Abnormal   Collection Time: 09/10/14  9:18 PM  Result Value Ref Range   Glucose-Capillary 128 (H) 65 - 99 mg/dL  Glucose, capillary     Status: Abnormal   Collection Time: 09/11/14  6:09 AM  Result Value Ref Range   Glucose-Capillary 112 (H) 65 - 99 mg/dL  Glucose, capillary     Status: Abnormal   Collection Time: 09/11/14 11:50 AM  Result Value Ref Range   Glucose-Capillary 105 (H) 65 - 99 mg/dL   Comment 1 Notify RN    Comment 2 Document in Chart     Physical Findings: AIMS: Facial and Oral Movements Muscles of Facial Expression: None, normal Lips and Perioral Area: None, normal Jaw: None, normal Tongue: None, normal,Extremity Movements Upper (arms, wrists, hands, fingers): None, normal Lower (legs, knees, ankles, toes): None, normal, Trunk Movements Neck, shoulders, hips: None, normal, Overall Severity Severity of abnormal movements (highest score from questions above): None, normal Incapacitation due to abnormal movements: None, normal Patient's awareness of abnormal movements (rate only patient's report): No Awareness, Dental Status Current problems with teeth and/or dentures?: No Does patient usually wear dentures?: No  CIWA:    COWS:      Assessment- improved compared to admission- less manic symptoms- less expansive, less pressured. Of note, Valproic Acid serum level sub therapeutic.  She is currently focused on being discharged soon. Staff reports some increased confusion at  times, particularly in evening , which  May represent an element of sun downing- at this time 0x3 and no current confusion or delirium. Short term memory is fair .    Treatment Plan Summary: Daily contact with patient to assess and evaluate symptoms and progress in treatment, Medication management, Plan ongoing inpatient treatment and medication management as below  Increase  Depakote ER  To 1000 mgrs QHS , for mood disorder,- increase due to ongoing  low serum level.  Continue Seroquel 200 mgrs QHS , To address mood disorder  Decrease Ativan 0.5 mgrs BID  As  Manic symptoms improved and to minimize BZD related cognitive side effects.  Metroprolol, Lisinopril and Glucotrol decrease frequency to once daily.  BP were low and also glucose check at bedside.  Medical Decision Making:  Established Problem, Stable/Improving (1), Review or order clinical lab tests (1) and  Review of Medication Regimen & Side Effects (2)   Neita Garnet, MD  09/11/2014, 4:53 PM

## 2014-09-11 NOTE — Plan of Care (Signed)
Problem: Diagnosis: Increased Risk For Suicide Attempt Goal: STG-Patient Will Comply With Medication Regime Outcome: Progressing Pt took her medications as ordered when offered. Pt denies adverse drug reactions at this time.

## 2014-09-11 NOTE — Progress Notes (Signed)
D: Pt alert, visible in milieu for long intervals during shift. Presents with anxious affect and mood. Pt is preoccupied about obtaining "strong pain medication, I need my Vicodin dose increased". Pt has approached Probation officer 3X thus far related to getting her pain medication regimen changed to Vicodin and at a higher dose. Pt rated her depression 5/10, hopelessness 10/10 and anxiety 10/10 on self inventory sheet. Pt's goal for today is "to go home" which she plans to attain by "talking to that MD". 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  Eye patch intact to left eye. Verbal redirections provided at frequent intervals to maintain safety as pt is also intrusive towards staff and peers.  A: 1:1 contact made with pt to conduct assessment and assess concerns. All medications administered as prescribed including PRN Flexaril and Motrin for muscle spasm and pain earlier this shift. Verbal education done on ordered medications and the importance of being compliant with ordered doses. Support, availability and encouragement provided to pt. Safety maintained on Q 15 minutes checks as ordered.   R: Pt compliant with medications. Remains safe on and off unit. Continue plan of care.

## 2014-09-11 NOTE — Tx Team (Addendum)
Interdisciplinary Treatment Plan Update (Adult)  Date:  09/11/2014   Time Reviewed:  2:59 PM   Progress in Treatment: Attending groups: Yes. Participating in groups:  Yes. Taking medication as prescribed:  Yes. Tolerating medication:  Yes. Family/Significant othe contact made:  Yes Patient understands diagnosis:  Yes   Discussing patient identified problems/goals with staff:  Yes, see initial care plan. Medical problems stabilized or resolved:  Yes. Denies suicidal/homicidal ideation: Yes. Issues/concerns per patient self-inventory:  No. Other:  New problem(s) identified:  Discharge Plan or Barriers:  Return home, follow up outpt  Reason for Continuation of Hospitalization: Medication stabilization Other; describe Mood instability, confusion, poor sleep  Comments:    Less manic but still hyper verbal, has tendency to be intrusive, disorganized. However, there is some improvement compared to her admission presentation and she does not seem quite as manic or elated today. She is tolerating medications well- denies side effects from medications . Valproic Acid Serum level low . She is reporting significant difficulties getting her needs met at home due to her limited support network. Of note, patient states she Prefers lithium over Seroquel, which she states has been more effective in the past. However, lithium is currently listed as a medication patient is allergic to- patient unsure why. However, based on potential for serious side effects, particularly if allergic , will avoid this medication at present and continue Seroquel management. Continues to c/o pain, poor sleep.  Last night nurse report states patient urinated on the floor 3 times  Estimated length of stay: Likely d/c tomorrow. Wed at the latest  New goal(s):  Review of initial/current patient goals per problem list:     Attendees: Patient:  09/11/2014 2:59 PM   Family:   09/11/2014 2:59 PM   Physician:  Neita Garnet, MD 09/11/2014 2:59 PM   Nursing:   Charlyne Quale, RN 09/11/2014 2:59 PM   CSW:    Roque Lias, LCSW   09/11/2014 2:59 PM   Other:  09/11/2014 2:59 PM   Other:   09/11/2014 2:59 PM   Other:  Lars Pinks, Nurse CM 09/11/2014 2:59 PM   Other:  Lucinda Dell, [name] TCT 09/11/2014 2:59 PM   Other:  Norberto Sorenson, Jol[place]  09/11/2014 2:59 PM   Other:  09/11/2014 2:59 PM   Other:  09/11/2014 2:59 PM   Other:  09/11/2014 2:59 PM   Other:  09/11/2014 2:59 PM   Other:  09/11/2014 2:59 PM   Other:   09/11/2014 2:59 PM    Scribe for Treatment Team:   Trish Mage, 09/11/2014 2:59 PM

## 2014-09-11 NOTE — Progress Notes (Signed)
Inpatient Diabetes Program Recommendations  AACE/ADA: New Consensus Statement on Inpatient Glycemic Control (2013)  Target Ranges:  Prepandial:   less than 140 mg/dL      Peak postprandial:   less than 180 mg/dL (1-2 hours)      Critically ill patients:  140 - 180 mg/dL    Results for Kelsey Hanna, Kelsey Hanna (MRN [id number]) as of 09/11/2014 09:03  Ref. Range 09/09/2014 06:14 09/09/2014 12:12 09/09/2014 13:02 09/09/2014 16:43 09/09/2014 21:03  Glucose-Capillary Latest Ref Range: 65-99 mg/dL 77 61 (L) 137 (H) 75 71    Results for Kelsey Hanna, Kelsey Hanna (MRN [id number]) as of 09/11/2014 09:03  Ref. Range 09/10/2014 06:03 09/10/2014 12:05 09/10/2014 16:36 09/10/2014 21:18  Glucose-Capillary Latest Ref Range: 65-99 mg/dL 91 86 90 128 (H)     Home DM Meds: Metformin 500 mg bid  Current DM Orders: Metformin 500 mg daily            Novolog Resistant SSI (0-20 units) TID AC + HS     -Note patient had one episode of Hypoglycemia at 12PM on 07/23.  Metformin was stopped.  Patient has not received any insulin so far since admission.  -Metformin restarted at lower dose this AM- 500 mg once daily.    MD- Please consider decreasing Novolog SSI to Sensitive scale (0-9 units) TID AC + HS    Will follow Wyn Quaker RN, MSN, CDE Diabetes Coordinator Inpatient Glycemic Control Team Team Pager: [phone] (8a-5p)

## 2014-09-12 DIAGNOSIS — F3110 Bipolar disorder, current episode manic without psychotic features, unspecified: Principal | ICD-10-CM

## 2014-09-12 DIAGNOSIS — R600 Localized edema: Secondary | ICD-10-CM | POA: Diagnosis not present

## 2014-09-12 LAB — GLUCOSE, CAPILLARY
Glucose-Capillary: 96 mg/dL (ref 65–99)
Glucose-Capillary: 110 mg/dL — ABNORMAL HIGH (ref 65–99)

## 2014-09-12 MED ORDER — COLCHICINE 0.6 MG PO TABS: 0.6000 mg | ORAL_TABLET | Freq: Every day | ORAL | Status: DC

## 2014-09-12 MED ORDER — ATORVASTATIN CALCIUM 40 MG PO TABS: 40.0000 mg | ORAL_TABLET | Freq: Every evening | ORAL | Status: DC

## 2014-09-12 MED ORDER — DICLOFENAC SODIUM 1 % TD GEL: 1.0000 "application " | Freq: Every day | TRANSDERMAL | Status: DC

## 2014-09-12 MED ORDER — NICOTINE POLACRILEX 2 MG MT GUM: 2.0000 mg | CHEWING_GUM | OROMUCOSAL | Status: DC | PRN

## 2014-09-12 MED ORDER — FLUTICASONE PROPIONATE 50 MCG/ACT NA SUSP: 2.0000 | Freq: Two times a day (BID) | NASAL | Status: DC

## 2014-09-12 MED ORDER — METOPROLOL TARTRATE 50 MG PO TABS: 50.0000 mg | ORAL_TABLET | Freq: Two times a day (BID) | ORAL | Status: DC

## 2014-09-12 MED ORDER — CYCLOBENZAPRINE HCL 10 MG PO TABS: 10.0000 mg | ORAL_TABLET | Freq: Three times a day (TID) | ORAL | Status: DC | PRN

## 2014-09-12 MED ORDER — FUROSEMIDE 40 MG PO TABS: 40.0000 mg | ORAL_TABLET | Freq: Every day | ORAL | Status: DC

## 2014-09-12 MED ORDER — ASPIRIN 325 MG PO TBEC: 325.0000 mg | DELAYED_RELEASE_TABLET | Freq: Every day | ORAL | Status: DC

## 2014-09-12 MED ORDER — POTASSIUM CHLORIDE ER 20 MEQ PO TBCR: 20.0000 meq | EXTENDED_RELEASE_TABLET | Freq: Every day | ORAL | Status: DC

## 2014-09-12 MED ORDER — FUROSEMIDE 40 MG PO TABS
40.0000 mg | ORAL_TABLET | Freq: Every day | ORAL | Status: DC
  Filled 2014-09-12 (×2): qty 1

## 2014-09-12 MED ORDER — QUETIAPINE FUMARATE 200 MG PO TABS: 200.0000 mg | ORAL_TABLET | Freq: Every day | ORAL | Status: DC

## 2014-09-12 MED ORDER — METFORMIN HCL 500 MG PO TABS: 500.0000 mg | ORAL_TABLET | Freq: Every day | ORAL | Status: DC

## 2014-09-12 MED ORDER — LISINOPRIL 5 MG PO TABS: 5.0000 mg | ORAL_TABLET | Freq: Every day | ORAL | Status: DC

## 2014-09-12 MED ORDER — LORAZEPAM 0.5 MG PO TABS: 0.5000 mg | ORAL_TABLET | Freq: Two times a day (BID) | ORAL | Status: DC

## 2014-09-12 MED ORDER — MECLIZINE HCL 12.5 MG PO TABS: 12.5000 mg | ORAL_TABLET | Freq: Three times a day (TID) | ORAL | Status: DC | PRN

## 2014-09-12 MED ORDER — ZOLPIDEM TARTRATE 5 MG PO TABS: 5.0000 mg | ORAL_TABLET | Freq: Every evening | ORAL | Status: DC | PRN

## 2014-09-12 MED ORDER — LEVOTHYROXINE SODIUM 100 MCG PO TABS: 100.0000 ug | ORAL_TABLET | Freq: Every day | ORAL | Status: DC

## 2014-09-12 MED ORDER — DIVALPROEX SODIUM ER 500 MG PO TB24: 1000.0000 mg | ORAL_TABLET | Freq: Every day | ORAL | Status: DC

## 2014-09-12 NOTE — Progress Notes (Signed)
I was asked to follow-up on Kelsey Hanna by Dr. Parke Poisson, regarding whether patient should resume Lasix at the time of discharge. I consulted on her last week, hence have a good grasp of her medical history. Apparently, patient has been on Lasix at home but the medication was not listed at the time of admission so she didn't receive it inpatient. Lately, she has noticed worsening lower edema but does not have any other complaints to suggest acute pulmonary edema. I have reviewed her chart today and seen her at bedside. Her renal function and vitals are stable, therefore, she can resume Lasix with plans to have her PCP follow her renal function in the next 1-2 weeks. I have ordered Lasix 40 mg daily as she was doing at home and added it to her discharge medications. Thank you for this follow-up request.

## 2014-09-12 NOTE — [facility] Group Notes (Signed)
[place] Group Notes:  (Nursing/MHT/Case Management/Adjunct)  Date:  09/12/2014  Time:  0900 Type of Therapy:  Nurse Education  Participation Level:  Active  Participation Quality:  Attentive  Affect:  Appropriate  Cognitive:  Alert  Insight:  Improving  Engagement in Group:  Engaged  Modes of Intervention:  Discussion, Education and Socialization  Summary of Progress/Problems:  Forrestine Him 09/12/2014, 3:43 PM

## 2014-09-12 NOTE — [facility] Suicide Risk Assessment (Signed)
[facility] Discharge Suicide Risk Assessment   Demographic Factors:  64 year old woman, single, two adult children, lives alone   Total Time spent with patient: 30 minutes  Musculoskeletal: Strength & Muscle Tone: within normal limits Gait & Station: normal Patient leans: N/A  Psychiatric Specialty Exam: Physical Exam  ROS  Blood pressure 120/79, pulse 115, temperature 98 F (36.7 C), temperature source Oral, resp. rate 16, height 5\' 1"  (1.549 m), weight 165 lb (74.844 kg).Body mass index is 31.19 kg/(m^2).  General Appearance: improved grooming   Eye Contact::  Good  Speech:  Normal Rate409  Volume:  Normal  Mood:  improved  Affect:  reactive   Thought Process:  Linear  Orientation:  Other:  alert and attentive   Thought Content:  denies hallucinations,  no delusions   Suicidal Thoughts:  No  Homicidal Thoughts:  No  Memory:  recent and remote fair   Judgement:  Other:  improved   Insight:  improved  Psychomotor Activity:  Normal- not restless or agitated at this time  Concentration:  Good  Recall:  Good  Fund of Knowledge:Good  Language: Good  Akathisia:  No  Handed:  Right  AIMS (if indicated):     Assets:  Communication Skills Resilience  Sleep:  Number of Hours: 4  Cognition: WNL  ADL's:  Improved    Have you used any form of tobacco in the last 30 days? (Cigarettes, Smokeless Tobacco, Cigars, and/or Pipes): Yes  Has this patient used any form of tobacco in the last 30 days? (Cigarettes, Smokeless Tobacco, Cigars, and/or Pipes) - patient smokes about 5-10 cigarettes per day, we discussed importance of smoking cessation   Mental Status Per Nursing Assessment::   On Admission:     Current Mental Status by Physician: Alert and attentive, better related, thought process more linear , less racing thoughts , no pressured speech, mood is improved and manic symptoms have tended to resolve , no SI, no HI , no psychotic symptoms. 0x3 at this time and at this time no evidence  of delirium  Loss Factors: Adult children perceived by patient as distant, compromised visual acuity, on disability  Historical Factors: History of Bipolar Disorder.   Risk Reduction Factors:   Positive coping skills or problem solving skills  Continued Clinical Symptoms:  At this time mood improved, symptoms of mania  Have improved - at this time mood improved, affect appropriate, no pressured speech or psychomotor agitation, no psychotic symptoms.    Cognitive Features That Contribute To Risk:  Fully alert and attentive at present- no current symptoms of delirium. Oriented to place , time, person. ( Except for day of week)   Suicide Risk:  Mild:  Suicidal ideation of limited frequency, intensity, duration, and specificity.  There are no identifiable plans, no associated intent, mild dysphoria and related symptoms, good self-control (both objective and subjective assessment), few other risk factors, and identifiable protective factors, including available and accessible social support.  Principal Problem: Bipolar affective disorder, current episode manic without psychotic symptoms Discharge Diagnoses:  Patient Active Problem List   Diagnosis Date Noted  . Bipolar disorder, current episode manic without psychotic features, severe [F31.13]   . Bipolar affective disorder, current episode manic without psychotic symptoms [F31.10] 09/05/2014  . Bipolar affective disorder, current episode manic with psychotic symptoms [F31.2] 09/05/2014  . Bipolar affective disorder, manic [F31.10] 09/03/2014  . Encounter for preadmission testing [Z01.818]   . PNA (pneumonia) [J18.9] 04/27/2014  . Type 2 diabetes mellitus [E11.9] 04/27/2014  .  Essential hypertension [I10] 04/27/2014  . Hypothyroidism [E03.9] 04/27/2014  . Tobacco abuse [Z72.0] 04/27/2014  . Bipolar I disorder, most recent episode (or current) unspecified [F31.9] 04/20/2014  . Acute encephalopathy [G93.40] 04/18/2014  . Unresponsiveness  [R40.4]   . Acute respiratory failure [J96.00]   . Encounter for feeding tube placement [Z87.898]   . Rapid atrial fibrillation [I48.91]   . Shortness of breath [R06.02]     Follow-up Information    Follow up with [facility]  On 10/10/2014.   Why:  Patient current w this provider, has next medications management appt on 8/23 at 2 PM and therapist Mikki Santee)   Contact information:   [address]  [place] [address]  Phone:  [phone] Fax:  [phone]        Go to [facility].   Why:  Karsten Fells, [phone]) has submitted paperwork for patient to receive ACT Team services.  Provider will contact you when the services are authorized and can begin.   Contact information:   [address]  [place] [address]  Phone:  [phone] Fax:  [phone]        Plan Of Care/Follow-up recommendations:  Activity:  as tolerated  Diet:  Heart Healthy, diabetic diet  Tests:  NA Other:  See below  Is patient on multiple antipsychotic therapies at discharge:  No   Has Patient had three or more failed trials of antipsychotic monotherapy by history:  No  Recommended Plan for Multiple Antipsychotic Therapies: NA  Patient requesting discharge and at this time there are no grounds for involuntary commitment. She is leaving in good spirits. Plans to return home. Plans to follow up at [facility]. States she has an established  PCP- Dr. Derrek Monaco,  at [facility] - I have encouraged her to follow up with PCP soon after discharge to follow up on her medical issues - HTN, DM, Lower extremity edema.   COBOS, [place] 09/12/2014, 3:18 PM

## 2014-09-12 NOTE — Clinical Social Work Note (Signed)
Patient states daughter, Adrian Prows, willing to pick up patient if waiting in lobby ready to dc.  Wants to come between 2 - 3:30.  CSW also left message for [facility] ACT Team to let them know patient is dcing today.  Patient has been referred for their services, auth pending.  Currently under care of [facility] psychiatrist.  Edwyna Shell, LCSW Clinical Social Worker

## 2014-09-12 NOTE — Progress Notes (Signed)
D: Patient presents rolling her clothes this morning. 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 She denies SI/HI. Complains of chronic generalized pain. Reports she is going home today. Attended nursing group on the unit.   A: Special checks q 15 mins in place for safety,. Medication administered per MD order(see eMAR). Encouragement and support provided.   R:Safety maintained. Pt refused metformin and lisinopril medication d/t her blood pressure and CBG is not elevated. 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 Will continue to monitor.

## 2014-09-12 NOTE — Progress Notes (Signed)
Inpatient Diabetes Program Recommendations  AACE/ADA: New Consensus Statement on Inpatient Glycemic Control (2013)  Target Ranges:  Prepandial:   less than 140 mg/dL      Peak postprandial:   less than 180 mg/dL (1-2 hours)      Critically ill patients:  140 - 180 mg/dL   Results for CORONA, POPOVICH (MRN [id number]) as of 09/12/2014 09:29  Ref. Range 09/11/2014 06:09 09/11/2014 11:50 09/11/2014 16:56 09/11/2014 21:47  Glucose-Capillary Latest Ref Range: 65-99 mg/dL 112 (H) 105 (H) 109 (H) 103 (H)     Home DM Meds: Metformin 500 mg bid  Current DM Orders: Metformin 500 mg daily  Novolog Resistant SSI (0-20 units) TID AC + HS    -Note patient had one episode of Hypoglycemia at 12PM on 07/23. Metformin was stopped. Patient has not received any insulin so far since 07/20.  -Metformin restarted at lower dose yesterday AM- 500 mg once daily.    MD- Please consider decreasing Novolog SSI to Sensitive scale (0-9 units) TID AC + HS or Could discontinue Novolog SSI completely for now but continue to monitor CBGs TID AC + HS.    Will follow Wyn Quaker RN, MSN, CDE Diabetes Coordinator Inpatient Glycemic Control Team Team Pager: [phone] (8a-5p)
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
## 2014-09-12 NOTE — Progress Notes (Addendum)
Discharge note: Patient discharged per MD order. Discharge summary reviewed with patient. Patient verbalizes understanding of discharge summary. Medication regimen reviewed with pt, rx and sample medication given to patient. 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 "I am always in pain." All items returned to patient from locker. Patient verbalizes and signs that she received all items from locker. Patient ambulatory out of facility. Patient daughter in lobby for discharge.

## 2014-09-18 NOTE — Progress Notes (Signed)
  [facility] Adult Case Management Discharge Plan :  Will you be returning to the same living situation after discharge:  Yes,  return to apartment At discharge, do you have transportation home?: Yes, family will transport home Do you have the ability to pay for your medications: Yes,  has insurance   Release of information consent forms completed and in the chart;  Patient's signature needed at discharge.  Patient to Follow up at: Follow-up Information    Follow up with [facility]  On 10/10/2014.   Why:  Patient current w this provider, has next medications management appt on 8/23 at 2 PM and therapist Mikki Santee)   Contact information:   [address]  [place] [address]  Phone:  [phone] Fax:  [phone]        Go to [facility].   Why:  Karsten Fells, [phone]) has submitted paperwork for patient to receive ACT Team services.  Provider will contact you when the services are authorized and can begin.   Contact information:   [address]  [place] [address]  Phone:  [phone] Fax:  [phone]         Safety Planning and Suicide Prevention discussed: Yes,  w patient's son by phone  Have you used any form of tobacco in the last 30 days? (Cigarettes, Smokeless Tobacco, Cigars, and/or Pipes): Yes  Has patient been referred to the Quitline?: Patient refused referral  Beverely Pace 09/18/2014, 9:15 AM

## 2014-09-20 ENCOUNTER — Emergency Department (HOSPITAL_COMMUNITY)
Admission: EM | Admit: 2014-09-20 | Discharge: 2014-09-20 | Disposition: A | Discharge status: Home / Self Care | Payer: Medicare Other | Attending: Emergency Medicine | Admitting: Emergency Medicine

## 2014-09-20 ENCOUNTER — Encounter (HOSPITAL_COMMUNITY): Payer: Self-pay | Admitting: Emergency Medicine

## 2014-09-20 DIAGNOSIS — Z7951 Long term (current) use of inhaled steroids: Secondary | ICD-10-CM | POA: Diagnosis not present

## 2014-09-20 DIAGNOSIS — G8929 Other chronic pain: Secondary | ICD-10-CM

## 2014-09-20 DIAGNOSIS — F319 Bipolar disorder, unspecified: Secondary | ICD-10-CM | POA: Insufficient documentation

## 2014-09-20 DIAGNOSIS — E875 Hyperkalemia: Secondary | ICD-10-CM | POA: Diagnosis not present

## 2014-09-20 DIAGNOSIS — Z720 Tobacco use: Secondary | ICD-10-CM | POA: Insufficient documentation

## 2014-09-20 DIAGNOSIS — Z79899 Other long term (current) drug therapy: Secondary | ICD-10-CM | POA: Diagnosis not present

## 2014-09-20 DIAGNOSIS — E119 Type 2 diabetes mellitus without complications: Secondary | ICD-10-CM | POA: Diagnosis not present

## 2014-09-20 DIAGNOSIS — Z8669 Personal history of other diseases of the nervous system and sense organs: Secondary | ICD-10-CM | POA: Diagnosis not present

## 2014-09-20 DIAGNOSIS — E039 Hypothyroidism, unspecified: Secondary | ICD-10-CM | POA: Insufficient documentation

## 2014-09-20 DIAGNOSIS — F419 Anxiety disorder, unspecified: Secondary | ICD-10-CM | POA: Diagnosis not present

## 2014-09-20 DIAGNOSIS — Z7982 Long term (current) use of aspirin: Secondary | ICD-10-CM | POA: Diagnosis not present

## 2014-09-20 DIAGNOSIS — Z8541 Personal history of malignant neoplasm of cervix uteri: Secondary | ICD-10-CM | POA: Diagnosis not present

## 2014-09-20 LAB — CBC WITH DIFFERENTIAL/PLATELET
Basophils Absolute: 0 10*3/uL (ref 0.0–0.1)
Basophils Relative: 0 % (ref 0–1)
EOS PCT: 0 % (ref 0–5)
Eosinophils Absolute: 0 10*3/uL (ref 0.0–0.7)
HEMATOCRIT: 40.1 % (ref 36.0–46.0)
HEMOGLOBIN: 13.4 g/dL (ref 12.0–15.0)
Lymphocytes Relative: 39 % (ref 12–46)
Lymphs Abs: 3.9 10*3/uL (ref 0.7–4.0)
MCH: 30.1 pg (ref 26.0–34.0)
MCHC: 33.4 g/dL (ref 30.0–36.0)
MCV: 90.1 fL (ref 78.0–100.0)
Monocytes Absolute: 0.8 10*3/uL (ref 0.1–1.0)
Monocytes Relative: 8 % (ref 3–12)
NEUTROS ABS: 5.1 10*3/uL (ref 1.7–7.7)
Neutrophils Relative %: 53 % (ref 43–77)
Platelets: 228 10*3/uL (ref 150–400)
RBC: 4.45 MIL/uL (ref 3.87–5.11)
RDW: 13 % (ref 11.5–15.5)
WBC: 9.8 10*3/uL (ref 4.0–10.5)

## 2014-09-20 LAB — COMPREHENSIVE METABOLIC PANEL
ALBUMIN: 4.4 g/dL (ref 3.5–5.0)
ALK PHOS: 70 U/L (ref 38–126)
ALT: 30 U/L (ref 14–54)
AST: 28 U/L (ref 15–41)
Anion gap: 12 (ref 5–15)
BUN: 18 mg/dL (ref 6–20)
CO2: 21 mmol/L — ABNORMAL LOW (ref 22–32)
Calcium: 10.1 mg/dL (ref 8.9–10.3)
Chloride: 102 mmol/L (ref 101–111)
Creatinine, Ser: 0.68 mg/dL (ref 0.44–1.00)
GFR calc Af Amer: >60 mL/min (ref >60)
GFR calc non Af Amer: >60 mL/min (ref >60)
Glucose, Bld: 97 mg/dL (ref 65–99)
Potassium: 5.3 mmol/L — ABNORMAL HIGH (ref 3.5–5.1)
Sodium: 135 mmol/L (ref 135–145)
Total Bilirubin: 0.8 mg/dL (ref 0.3–1.2)
Total Protein: 7.6 g/dL (ref 6.5–8.1)

## 2014-09-20 LAB — ETHANOL: Alcohol, Ethyl (B): <5 mg/dL (ref <5)

## 2014-09-20 LAB — VALPROIC ACID LEVEL: Valproic Acid Lvl: 75 ug/mL (ref 50.0–100.0)

## 2014-09-20 MED ORDER — QUETIAPINE FUMARATE 100 MG PO TABS: 100.0000 mg | ORAL_TABLET | Freq: Two times a day (BID) | ORAL | Status: DC

## 2014-09-20 MED ORDER — GABAPENTIN 300 MG PO CAPS: 300.0000 mg | ORAL_CAPSULE | Freq: Three times a day (TID) | ORAL | Status: DC

## 2014-09-20 MED ORDER — MELOXICAM 7.5 MG PO TABS: 7.5000 mg | ORAL_TABLET | Freq: Every day | ORAL | Status: DC

## 2014-09-20 MED ORDER — FUROSEMIDE 80 MG PO TABS: 80.0000 mg | ORAL_TABLET | Freq: Every day | ORAL | Status: DC

## 2014-09-20 MED ORDER — DULOXETINE HCL 30 MG PO CPEP: 30.0000 mg | ORAL_CAPSULE | Freq: Every day | ORAL | Status: DC

## 2014-09-20 MED ORDER — DIVALPROEX SODIUM ER 500 MG PO TB24: 500.0000 mg | ORAL_TABLET | Freq: Every day | ORAL | Status: DC

## 2014-09-20 MED ORDER — CYCLOBENZAPRINE HCL 10 MG PO TABS: 10.0000 mg | ORAL_TABLET | Freq: Three times a day (TID) | ORAL | Status: DC | PRN

## 2014-09-20 NOTE — ED Provider Notes (Signed)
CSN: [id number]     Arrival date & time 09/20/14  1434 History   First MD Initiated Contact with Patient 09/20/14 1614     Chief Complaint  Patient presents with  . hypersensitive to light and sound       HPI  Pt presents with multiple complaints, most related to chronic pain in foot/ankle, and back.  States that she gets MS contin from "[facility]", but hasn't for 4 months bc she was financially "sideways" with the clinic. Has "new" appointment in 10 days.  We discussed that chronically prescribed controlled substances would not be renewed in the ER.  She is accepting, reluctantly, to this.  Also states that she has a "new" appointment with a pcp, also in 10 days, and requests refills of multiple medications until that time.  She denies any new complaints.   Past Medical History  Diagnosis Date  . Bipolar 1 disorder   . Hypothyroid   . Dyslipidemia   . Retinal detachment   . Sleep apnea   . Diabetes mellitus     dx within last yr....just takes pills  . Cancer     cancer of uterus...no chemo or radiation   Past Surgical History  Procedure Laterality Date  . Abdominal hysterectomy    . Total abdominal hysterectomy w/ bilateral salpingoophorectomy    . Tonsillectomy    . Lumbar laminectomy/decompression microdiscectomy  07/17/2011    Procedure: LUMBAR LAMINECTOMY/DECOMPRESSION MICRODISCECTOMY;  Surgeon: Sinclair Ship, MD;  Location: [facility];  Service: Orthopedics;  Laterality: Left;  Left sided lumbar 4-5 microdisectomy   No family history on file. History  Substance Use Topics  . Smoking status: Current Every Day Smoker -- 0.25 packs/day for 35 years    Types: Cigarettes    Last Attempt to Quit: 07/09/1999  . Smokeless tobacco: Not on file  . Alcohol Use: No     Comment: socially   OB History    No data available     Review of Systems  Constitutional: Negative for fever, chills, diaphoresis, appetite change and fatigue.  HENT: Negative for mouth sores, sore  throat and trouble swallowing.   Eyes: Negative for visual disturbance.  Respiratory: Negative for cough, chest tightness, shortness of breath and wheezing.   Cardiovascular: Negative for chest pain.  Gastrointestinal: Negative for nausea, vomiting, abdominal pain, diarrhea and abdominal distention.  Endocrine: Negative for polydipsia, polyphagia and polyuria.  Genitourinary: Negative for dysuria, frequency and hematuria.  Musculoskeletal: Positive for myalgias, back pain and arthralgias. Negative for gait problem.  Skin: Negative for color change, pallor and rash.  Neurological: Negative for dizziness, syncope, light-headedness and headaches.  Hematological: Does not bruise/bleed easily.  Psychiatric/Behavioral: Negative for suicidal ideas, hallucinations, behavioral problems, confusion, sleep disturbance, self-injury and dysphoric mood. The patient is hyperactive. The patient is not nervous/anxious.       Allergies  Lithium  Home Medications   Prior to Admission medications   Medication Sig Start Date End Date Taking? Authorizing Provider  atorvastatin (LIPITOR) 40 MG tablet Take 1 tablet (40 mg total) by mouth every evening. For high cholesterol Patient taking differently: Take 40 mg by mouth daily. For high cholesterol 09/12/14  Yes Encarnacion Slates, NP  colchicine 0.6 MG tablet Take 1 tablet (0.6 mg total) by mouth daily. For gout 09/12/14  Yes Encarnacion Slates, NP  ibuprofen (ADVIL,MOTRIN) 200 MG tablet Take 200 mg by mouth every 6 (six) hours as needed for moderate pain.   Yes Historical Provider, MD  levothyroxine (SYNTHROID) 137 MCG tablet Take 137 mcg by mouth daily before breakfast.   Yes Historical Provider, MD  lisinopril (PRINIVIL,ZESTRIL) 5 MG tablet Take 1 tablet (5 mg total) by mouth daily. For high blood pressure 09/12/14  Yes Encarnacion Slates, NP  metFORMIN (GLUCOPHAGE) 500 MG tablet Take 1 tablet (500 mg total) by mouth daily with breakfast. For diabetes mellitus Patient taking  differently: Take 500 mg by mouth 2 (two) times daily with a meal. For diabetes mellitus 09/12/14  Yes Encarnacion Slates, NP  metoprolol (LOPRESSOR) 50 MG tablet Take 1 tablet (50 mg total) by mouth 2 (two) times daily with a meal. For high blood pressure 09/12/14  Yes Encarnacion Slates, NP  naproxen sodium (ANAPROX) 220 MG tablet Take 220 mg by mouth 2 (two) times daily as needed (pain).   Yes Historical Provider, MD  aspirin 325 MG EC tablet Take 1 tablet (325 mg total) by mouth daily. For heart health 09/12/14   Encarnacion Slates, NP  cyclobenzaprine (FLEXERIL) 10 MG tablet Take 1 tablet (10 mg total) by mouth 3 (three) times daily as needed for muscle spasms. 09/20/14   Tanna Furry, MD  diclofenac sodium (VOLTAREN) 1 % GEL Apply 1 application topically daily. For arthritic pain 09/12/14   Encarnacion Slates, NP  divalproex (DEPAKOTE ER) 500 MG 24 hr tablet Take 1 tablet (500 mg total) by mouth daily. 09/20/14   Tanna Furry, MD  DULoxetine (CYMBALTA) 30 MG capsule Take 1 capsule (30 mg total) by mouth daily. 09/20/14   Tanna Furry, MD  fluticasone (FLONASE) 50 MCG/ACT nasal spray Place 2 sprays into both nostrils 2 (two) times daily. For allergies 09/12/14   Encarnacion Slates, NP  furosemide (LASIX) 80 MG tablet Take 1 tablet (80 mg total) by mouth daily. 09/20/14   Tanna Furry, MD  gabapentin (NEURONTIN) 300 MG capsule Take 1 capsule (300 mg total) by mouth 3 (three) times daily. 09/20/14   Tanna Furry, MD  levothyroxine (SYNTHROID, LEVOTHROID) 100 MCG tablet Take 1 tablet (100 mcg total) by mouth daily before breakfast. For low functioning thyroid 09/12/14   Encarnacion Slates, NP  LORazepam (ATIVAN) 0.5 MG tablet Take 1 tablet (0.5 mg total) by mouth 2 (two) times daily. For severe anxiety 09/12/14   Encarnacion Slates, NP  meclizine (ANTIVERT) 12.5 MG tablet Take 1 tablet (12.5 mg total) by mouth 3 (three) times daily as needed for dizziness. 09/12/14   Encarnacion Slates, NP  meloxicam (MOBIC) 7.5 MG tablet Take 1 tablet (7.5 mg total) by mouth daily.  09/20/14   Tanna Furry, MD  nicotine polacrilex (NICORETTE) 2 MG gum Take 1 each (2 mg total) by mouth as needed for smoking cessation. 09/12/14   Encarnacion Slates, NP  potassium chloride 20 MEQ TBCR Take 20 mEq by mouth daily. For low potassium 09/12/14   Encarnacion Slates, NP  QUEtiapine (SEROQUEL) 100 MG tablet Take 1 tablet (100 mg total) by mouth 2 (two) times daily. 09/20/14   Tanna Furry, MD  zolpidem (AMBIEN) 5 MG tablet Take 1 tablet (5 mg total) by mouth at bedtime as needed for sleep. 09/12/14   Encarnacion Slates, NP   BP 146/81 mmHg  Pulse 96  Temp(Src) 98 F (36.7 C) (Oral)  Resp 16  SpO2 100% Physical Exam  Constitutional: She is oriented to person, place, and time. She appears well-developed and well-nourished. No distress.  HENT:  Head: Normocephalic.  Eyes: Conjunctivae are normal. Pupils are  equal, round, and reactive to light. No scleral icterus.  Neck: Normal range of motion. Neck supple. No thyromegaly present.  Cardiovascular: Normal rate and regular rhythm.  Exam reveals no gallop and no friction rub.   No murmur heard. Pulmonary/Chest: Effort normal and breath sounds normal. No respiratory distress. She has no wheezes. She has no rales.  Abdominal: Soft. Bowel sounds are normal. She exhibits no distension. There is no tenderness. There is no rebound.  Musculoskeletal: Normal range of motion.  Neurological: She is alert and oriented to person, place, and time.  Skin: Skin is warm and dry. No rash noted.  Psychiatric: Her mood appears anxious. Her affect is not labile and not inappropriate. Her speech is not rapid and/or pressured and not delayed. She is hyperactive. Thought content is paranoid. Thought content is not delusional. Cognition and memory are not impaired. She does not express impulsivity or inappropriate judgment. She expresses no suicidal plans and no homicidal plans. She is communicative.    ED Course  Procedures (including critical care time) Labs Review Labs Reviewed   COMPREHENSIVE METABOLIC PANEL - Abnormal; Notable for the following:    Potassium 5.3 (*)    CO2 21 (*)    All other components within normal limits  CBC WITH DIFFERENTIAL/PLATELET  ETHANOL  VALPROIC ACID LEVEL  URINE RAPID DRUG SCREEN, HOSP PERFORMED  URINALYSIS, ROUTINE W REFLEX MICROSCOPIC (NOT AT [facility])    Imaging Review No results found.   EKG Interpretation None      MDM   Final diagnoses:  Chronic pain  Bipolar 1 disorder    Pt with rapid speech, but not pressured.  Not frankly tangential.  Denies HI/SI/Hallucinations.  Main concern is medication refills.  Admits that she had financial and psychiatric issues that precluded her from obtainig her Pain mgmt meds, and meds from PCP.  States appointments for both in 10 days.      Tanna Furry, MD 09/20/14 [id number]

## 2014-09-20 NOTE — ED Notes (Signed)
Patient walking barefoot through lobby, offered socks and patient

## 2014-09-20 NOTE — Discharge Instructions (Signed)
Continue your planned follow up at 1)  [facility], and with 2) Your chronic pain management providers.  Refills have been given for 10 days on the requested medications.

## 2014-09-20 NOTE — ED Notes (Signed)
Per patient states the police brought her here because she has a migraine "without the migraine"

## 2014-10-04 ENCOUNTER — Emergency Department (HOSPITAL_COMMUNITY): Payer: Medicare Other

## 2014-10-04 ENCOUNTER — Inpatient Hospital Stay (HOSPITAL_COMMUNITY)
Admission: EM | Admit: 2014-10-04 | Discharge: 2014-10-13 | DRG: 885 | Disposition: A | Discharge status: Other Acute Inpatient Hospital | Payer: Medicare Other | Attending: Family Medicine | Admitting: Family Medicine

## 2014-10-04 ENCOUNTER — Encounter (HOSPITAL_COMMUNITY): Payer: Self-pay

## 2014-10-04 DIAGNOSIS — E119 Type 2 diabetes mellitus without complications: Secondary | ICD-10-CM | POA: Diagnosis present

## 2014-10-04 DIAGNOSIS — F29 Unspecified psychosis not due to a substance or known physiological condition: Secondary | ICD-10-CM | POA: Insufficient documentation

## 2014-10-04 DIAGNOSIS — E039 Hypothyroidism, unspecified: Secondary | ICD-10-CM | POA: Diagnosis present

## 2014-10-04 DIAGNOSIS — R7881 Bacteremia: Secondary | ICD-10-CM | POA: Insufficient documentation

## 2014-10-04 DIAGNOSIS — D696 Thrombocytopenia, unspecified: Secondary | ICD-10-CM | POA: Diagnosis present

## 2014-10-04 DIAGNOSIS — H548 Legal blindness, as defined in USA: Secondary | ICD-10-CM | POA: Diagnosis present

## 2014-10-04 DIAGNOSIS — A419 Sepsis, unspecified organism: Secondary | ICD-10-CM | POA: Diagnosis not present

## 2014-10-04 DIAGNOSIS — G4730 Sleep apnea, unspecified: Secondary | ICD-10-CM | POA: Diagnosis present

## 2014-10-04 DIAGNOSIS — E0590 Thyrotoxicosis, unspecified without thyrotoxic crisis or storm: Secondary | ICD-10-CM | POA: Diagnosis present

## 2014-10-04 DIAGNOSIS — I959 Hypotension, unspecified: Secondary | ICD-10-CM | POA: Diagnosis present

## 2014-10-04 DIAGNOSIS — E785 Hyperlipidemia, unspecified: Secondary | ICD-10-CM | POA: Diagnosis present

## 2014-10-04 DIAGNOSIS — G928 Other toxic encephalopathy: Secondary | ICD-10-CM | POA: Diagnosis present

## 2014-10-04 DIAGNOSIS — F312 Bipolar disorder, current episode manic severe with psychotic features: Principal | ICD-10-CM | POA: Diagnosis present

## 2014-10-04 DIAGNOSIS — F319 Bipolar disorder, unspecified: Secondary | ICD-10-CM | POA: Diagnosis not present

## 2014-10-04 DIAGNOSIS — F17210 Nicotine dependence, cigarettes, uncomplicated: Secondary | ICD-10-CM | POA: Diagnosis present

## 2014-10-04 DIAGNOSIS — E870 Hyperosmolality and hypernatremia: Secondary | ICD-10-CM | POA: Insufficient documentation

## 2014-10-04 DIAGNOSIS — E876 Hypokalemia: Secondary | ICD-10-CM | POA: Diagnosis present

## 2014-10-04 DIAGNOSIS — Z66 Do not resuscitate: Secondary | ICD-10-CM | POA: Diagnosis present

## 2014-10-04 DIAGNOSIS — T1490XA Injury, unspecified, initial encounter: Secondary | ICD-10-CM

## 2014-10-04 DIAGNOSIS — Z7982 Long term (current) use of aspirin: Secondary | ICD-10-CM

## 2014-10-04 DIAGNOSIS — Z888 Allergy status to other drugs, medicaments and biological substances status: Secondary | ICD-10-CM | POA: Diagnosis not present

## 2014-10-04 DIAGNOSIS — Z8542 Personal history of malignant neoplasm of other parts of uterus: Secondary | ICD-10-CM

## 2014-10-04 DIAGNOSIS — I10 Essential (primary) hypertension: Secondary | ICD-10-CM | POA: Diagnosis present

## 2014-10-04 DIAGNOSIS — G92 Toxic encephalopathy: Secondary | ICD-10-CM | POA: Diagnosis present

## 2014-10-04 DIAGNOSIS — Z791 Long term (current) use of non-steroidal anti-inflammatories (NSAID): Secondary | ICD-10-CM | POA: Diagnosis not present

## 2014-10-04 DIAGNOSIS — R4182 Altered mental status, unspecified: Secondary | ICD-10-CM

## 2014-10-04 DIAGNOSIS — R6521 Severe sepsis with septic shock: Secondary | ICD-10-CM

## 2014-10-04 DIAGNOSIS — E860 Dehydration: Secondary | ICD-10-CM | POA: Diagnosis present

## 2014-10-04 DIAGNOSIS — Z79899 Other long term (current) drug therapy: Secondary | ICD-10-CM

## 2014-10-04 DIAGNOSIS — S42302A Unspecified fracture of shaft of humerus, left arm, initial encounter for closed fracture: Secondary | ICD-10-CM

## 2014-10-04 DIAGNOSIS — N179 Acute kidney failure, unspecified: Secondary | ICD-10-CM | POA: Diagnosis present

## 2014-10-04 LAB — CRYPTOCOCCAL ANTIGEN, CSF: CRYPTO AG: NEGATIVE

## 2014-10-04 LAB — ETHANOL: Alcohol, Ethyl (B): <5 mg/dL (ref <5)

## 2014-10-04 LAB — COMPREHENSIVE METABOLIC PANEL
ALBUMIN: 4.3 g/dL (ref 3.5–5.0)
ALT: 54 U/L (ref 14–54)
AST: 78 U/L — AB (ref 15–41)
Alkaline Phosphatase: 63 U/L (ref 38–126)
Anion gap: 21 — ABNORMAL HIGH (ref 5–15)
BILIRUBIN TOTAL: 1.7 mg/dL — AB (ref 0.3–1.2)
BUN: 75 mg/dL — AB (ref 6–20)
CO2: 17 mmol/L — ABNORMAL LOW (ref 22–32)
Calcium: 9.9 mg/dL (ref 8.9–10.3)
Chloride: 127 mmol/L — ABNORMAL HIGH (ref 101–111)
Creatinine, Ser: 3.07 mg/dL — ABNORMAL HIGH (ref 0.44–1.00)
GFR calc Af Amer: 17 mL/min — ABNORMAL LOW (ref >60)
GFR calc non Af Amer: 15 mL/min — ABNORMAL LOW (ref >60)
GLUCOSE: 118 mg/dL — AB (ref 65–99)
POTASSIUM: 4 mmol/L (ref 3.5–5.1)
Sodium: 165 mmol/L (ref 135–145)
TOTAL PROTEIN: 7.8 g/dL (ref 6.5–8.1)

## 2014-10-04 LAB — URINE MICROSCOPIC-ADD ON

## 2014-10-04 LAB — BASIC METABOLIC PANEL
Anion gap: 14 (ref 5–15)
Anion gap: 19 — ABNORMAL HIGH (ref 5–15)
Anion gap: 12 (ref 5–15)
BUN: 68 mg/dL — AB (ref 6–20)
BUN: 69 mg/dL — AB (ref 6–20)
BUN: 63 mg/dL — AB (ref 6–20)
CHLORIDE: 128 mmol/L — AB (ref 101–111)
CO2: 20 mmol/L — AB (ref 22–32)
CO2: 17 mmol/L — AB (ref 22–32)
CO2: 23 mmol/L (ref 22–32)
CREATININE: 2.36 mg/dL — AB (ref 0.44–1.00)
Calcium: 8.2 mg/dL — ABNORMAL LOW (ref 8.9–10.3)
Calcium: 8.3 mg/dL — ABNORMAL LOW (ref 8.9–10.3)
Calcium: 8.4 mg/dL — ABNORMAL LOW (ref 8.9–10.3)
Chloride: 129 mmol/L — ABNORMAL HIGH (ref 101–111)
Chloride: 128 mmol/L — ABNORMAL HIGH (ref 101–111)
Creatinine, Ser: 2.03 mg/dL — ABNORMAL HIGH (ref 0.44–1.00)
Creatinine, Ser: 1.85 mg/dL — ABNORMAL HIGH (ref 0.44–1.00)
GFR calc Af Amer: 24 mL/min — ABNORMAL LOW (ref >60)
GFR calc Af Amer: 28 mL/min — ABNORMAL LOW (ref >60)
GFR calc Af Amer: 32 mL/min — ABNORMAL LOW (ref >60)
GFR calc non Af Amer: 20 mL/min — ABNORMAL LOW (ref >60)
GFR, EST NON AFRICAN AMERICAN: 25 mL/min — AB (ref >60)
GFR, EST NON AFRICAN AMERICAN: 27 mL/min — AB (ref >60)
GLUCOSE: 110 mg/dL — AB (ref 65–99)
GLUCOSE: 110 mg/dL — AB (ref 65–99)
Glucose, Bld: 127 mg/dL — ABNORMAL HIGH (ref 65–99)
POTASSIUM: 3.4 mmol/L — AB (ref 3.5–5.1)
POTASSIUM: 3 mmol/L — AB (ref 3.5–5.1)
Potassium: 2.9 mmol/L — ABNORMAL LOW (ref 3.5–5.1)
Sodium: 162 mmol/L (ref 135–145)
Sodium: 165 mmol/L (ref 135–145)
Sodium: 163 mmol/L (ref 135–145)

## 2014-10-04 LAB — I-STAT CG4 LACTIC ACID, ED
LACTIC ACID, VENOUS: 3.35 mmol/L — AB (ref 0.5–2.0)
LACTIC ACID, VENOUS: 1.98 mmol/L (ref 0.5–2.0)

## 2014-10-04 LAB — CSF CELL COUNT WITH DIFFERENTIAL
RBC COUNT CSF: 2 /mm3 — AB
RBC Count, CSF: 1 /mm3 — ABNORMAL HIGH
TUBE #: 1
TUBE #: 4
WBC, CSF: 2 /mm3 (ref 0–5)
WBC, CSF: 2 /mm3 (ref 0–5)

## 2014-10-04 LAB — RAPID URINE DRUG SCREEN, HOSP PERFORMED
Amphetamines: NOT DETECTED
BARBITURATES: NOT DETECTED
BENZODIAZEPINES: NOT DETECTED
COCAINE: NOT DETECTED
Opiates: NOT DETECTED
TETRAHYDROCANNABINOL: NOT DETECTED

## 2014-10-04 LAB — CBC WITH DIFFERENTIAL/PLATELET
Basophils Absolute: 0 10*3/uL (ref 0.0–0.1)
Basophils Relative: 0 % (ref 0–1)
EOS PCT: 0 % (ref 0–5)
Eosinophils Absolute: 0 10*3/uL (ref 0.0–0.7)
HEMATOCRIT: 53.7 % — AB (ref 36.0–46.0)
Hemoglobin: 17.9 g/dL — ABNORMAL HIGH (ref 12.0–15.0)
LYMPHS ABS: 2.5 10*3/uL (ref 0.7–4.0)
Lymphocytes Relative: 14 % (ref 12–46)
MCH: 31.5 pg (ref 26.0–34.0)
MCHC: 33.3 g/dL (ref 30.0–36.0)
MCV: 94.5 fL (ref 78.0–100.0)
MONOS PCT: 13 % — AB (ref 3–12)
Monocytes Absolute: 2.3 10*3/uL — ABNORMAL HIGH (ref 0.1–1.0)
NEUTROS ABS: 12.9 10*3/uL — AB (ref 1.7–7.7)
Neutrophils Relative %: 73 % (ref 43–77)
Platelets: 227 10*3/uL (ref 150–400)
RBC: 5.68 MIL/uL — ABNORMAL HIGH (ref 3.87–5.11)
RDW: 14.5 % (ref 11.5–15.5)
WBC: 17.7 10*3/uL — ABNORMAL HIGH (ref 4.0–10.5)

## 2014-10-04 LAB — URINALYSIS, ROUTINE W REFLEX MICROSCOPIC
GLUCOSE, UA: NEGATIVE mg/dL
HGB URINE DIPSTICK: NEGATIVE
KETONES UR: 15 mg/dL — AB
Leukocytes, UA: NEGATIVE
Nitrite: NEGATIVE
PROTEIN: 30 mg/dL — AB
Specific Gravity, Urine: 1.024 (ref 1.005–1.030)
Urobilinogen, UA: 1 mg/dL (ref 0.0–1.0)
pH: 5 (ref 5.0–8.0)

## 2014-10-04 LAB — PROTEIN AND GLUCOSE, CSF
Glucose, CSF: 73 mg/dL — ABNORMAL HIGH (ref 40–70)
Total  Protein, CSF: 24 mg/dL (ref 15–45)

## 2014-10-04 LAB — VALPROIC ACID LEVEL: Valproic Acid Lvl: <10 ug/mL — ABNORMAL LOW (ref 50.0–100.0)

## 2014-10-04 LAB — I-STAT ARTERIAL BLOOD GAS, ED
Acid-base deficit: 5 mmol/L — ABNORMAL HIGH (ref 0.0–2.0)
BICARBONATE: 18.1 meq/L — AB (ref 20.0–24.0)
O2 Saturation: 97 %
PO2 ART: 105 mmHg — AB (ref 80.0–100.0)
TCO2: 19 mmol/L (ref 0–100)
pCO2 arterial: 30.7 mmHg — ABNORMAL LOW (ref 35.0–45.0)
pH, Arterial: 7.391 (ref 7.350–7.450)

## 2014-10-04 LAB — T4, FREE: Free T4: 0.94 ng/dL (ref 0.61–1.12)

## 2014-10-04 LAB — SALICYLATE LEVEL: Salicylate Lvl: <4 mg/dL (ref 2.8–30.0)

## 2014-10-04 LAB — GLUCOSE, CAPILLARY
Glucose-Capillary: 103 mg/dL — ABNORMAL HIGH (ref 65–99)
Glucose-Capillary: 102 mg/dL — ABNORMAL HIGH (ref 65–99)

## 2014-10-04 LAB — CK: Total CK: 674 U/L — ABNORMAL HIGH (ref 38–234)

## 2014-10-04 LAB — ACETAMINOPHEN LEVEL: Acetaminophen (Tylenol), Serum: <10 ug/mL — ABNORMAL LOW (ref 10–30)

## 2014-10-04 LAB — TSH: TSH: 0.214 u[IU]/mL — AB (ref 0.350–4.500)

## 2014-10-04 LAB — MRSA PCR SCREENING: MRSA BY PCR: NEGATIVE

## 2014-10-04 MED ORDER — SODIUM CHLORIDE 0.9 % IV BOLUS (SEPSIS): 500.0000 mL | INTRAVENOUS | Status: AC

## 2014-10-04 MED ORDER — MIDAZOLAM HCL 2 MG/2ML IJ SOLN
4.0000 mg | Freq: Once | INTRAMUSCULAR | Status: AC
  Administered 2014-10-04: 2 mg via INTRAVENOUS
  Filled 2014-10-04: qty 4

## 2014-10-04 MED ORDER — CETYLPYRIDINIUM CHLORIDE 0.05 % MT LIQD
7.0000 mL | Freq: Two times a day (BID) | OROMUCOSAL | Status: DC
  Administered 2014-10-04 – 2014-10-13 (×13): 7 mL via OROMUCOSAL

## 2014-10-04 MED ORDER — VANCOMYCIN HCL 500 MG IV SOLR
500.0000 mg | Freq: Once | INTRAVENOUS | Status: AC
  Administered 2014-10-04: 500 mg via INTRAVENOUS
  Filled 2014-10-04: qty 500

## 2014-10-04 MED ORDER — LIDOCAINE-EPINEPHRINE 1 %-1:100000 IJ SOLN
10.0000 mL | Freq: Once | INTRAMUSCULAR | Status: AC
  Administered 2014-10-04: 10 mL
  Filled 2014-10-04: qty 1

## 2014-10-04 MED ORDER — ONDANSETRON HCL 4 MG PO TABS: 4.0000 mg | ORAL_TABLET | Freq: Four times a day (QID) | ORAL | Status: DC | PRN

## 2014-10-04 MED ORDER — PIPERACILLIN-TAZOBACTAM 3.375 G IVPB 30 MIN
3.3750 g | Freq: Once | INTRAVENOUS | Status: AC
  Administered 2014-10-04: 3.375 g via INTRAVENOUS
  Filled 2014-10-04: qty 50

## 2014-10-04 MED ORDER — SODIUM CHLORIDE 0.9 % IV BOLUS (SEPSIS)
1000.0000 mL | INTRAVENOUS | Status: AC
  Administered 2014-10-04: 1000 mL via INTRAVENOUS

## 2014-10-04 MED ORDER — ACETAMINOPHEN 650 MG RE SUPP: 650.0000 mg | Freq: Four times a day (QID) | RECTAL | Status: DC | PRN

## 2014-10-04 MED ORDER — HEPARIN SODIUM (PORCINE) 5000 UNIT/ML IJ SOLN
5000.0000 [IU] | Freq: Three times a day (TID) | INTRAMUSCULAR | Status: DC
  Administered 2014-10-04 – 2014-10-06 (×5): 5000 [IU] via SUBCUTANEOUS
  Filled 2014-10-04 (×8): qty 1

## 2014-10-04 MED ORDER — LORAZEPAM 2 MG/ML IJ SOLN
1.0000 mg | INTRAMUSCULAR | Status: DC | PRN
  Administered 2014-10-04 – 2014-10-06 (×5): 1 mg via INTRAVENOUS
  Filled 2014-10-04 (×5): qty 1

## 2014-10-04 MED ORDER — PIPERACILLIN-TAZOBACTAM IN DEX 2-0.25 GM/50ML IV SOLN
2.2500 g | Freq: Three times a day (TID) | INTRAVENOUS | Status: DC
  Administered 2014-10-04 – 2014-10-05 (×3): 2.25 g via INTRAVENOUS
  Filled 2014-10-04 (×8): qty 50

## 2014-10-04 MED ORDER — SODIUM CHLORIDE 0.45 % IV SOLN: INTRAVENOUS | Status: DC

## 2014-10-04 MED ORDER — SODIUM CHLORIDE 0.9 % IJ SOLN
3.0000 mL | Freq: Two times a day (BID) | INTRAMUSCULAR | Status: DC
  Administered 2014-10-05 – 2014-10-11 (×13): 3 mL via INTRAVENOUS

## 2014-10-04 MED ORDER — ACETAMINOPHEN 325 MG PO TABS
650.0000 mg | ORAL_TABLET | Freq: Four times a day (QID) | ORAL | Status: DC | PRN
  Administered 2014-10-08 – 2014-10-10 (×5): 650 mg via ORAL
  Filled 2014-10-04 (×6): qty 2

## 2014-10-04 MED ORDER — SODIUM CHLORIDE 0.9 % IV BOLUS (SEPSIS)
1000.0000 mL | Freq: Once | INTRAVENOUS | Status: AC
  Administered 2014-10-04: 1000 mL via INTRAVENOUS

## 2014-10-04 MED ORDER — ACETAMINOPHEN 650 MG RE SUPP
975.0000 mg | Freq: Once | RECTAL | Status: AC
  Administered 2014-10-04: 975 mg via RECTAL
  Filled 2014-10-04 (×2): qty 1

## 2014-10-04 MED ORDER — VANCOMYCIN HCL IN DEXTROSE 1-5 GM/200ML-% IV SOLN
1000.0000 mg | Freq: Once | INTRAVENOUS | Status: AC
  Administered 2014-10-04: 1000 mg via INTRAVENOUS
  Filled 2014-10-04: qty 200

## 2014-10-04 MED ORDER — VANCOMYCIN HCL IN DEXTROSE 1-5 GM/200ML-% IV SOLN: 1000.0000 mg | INTRAVENOUS | Status: DC

## 2014-10-04 MED ORDER — SODIUM CHLORIDE 0.45 % IV SOLN
INTRAVENOUS | Status: DC
  Administered 2014-10-04: 100 mL via INTRAVENOUS

## 2014-10-04 MED ORDER — SODIUM CHLORIDE 0.45 % IV SOLN
INTRAVENOUS | Status: DC
  Administered 2014-10-04: 1000 mL via INTRAVENOUS

## 2014-10-04 MED ORDER — INSULIN ASPART 100 UNIT/ML ~~LOC~~ SOLN
0.0000 [IU] | Freq: Three times a day (TID) | SUBCUTANEOUS | Status: DC
  Administered 2014-10-05: 2 [IU] via SUBCUTANEOUS

## 2014-10-04 MED ORDER — INSULIN ASPART 100 UNIT/ML ~~LOC~~ SOLN: 0.0000 [IU] | Freq: Every day | SUBCUTANEOUS | Status: DC

## 2014-10-04 MED ORDER — POTASSIUM CHLORIDE 10 MEQ/100ML IV SOLN
10.0000 meq | INTRAVENOUS | Status: AC
  Administered 2014-10-04 – 2014-10-05 (×3): 10 meq via INTRAVENOUS
  Filled 2014-10-04 (×3): qty 100

## 2014-10-04 MED ORDER — ONDANSETRON HCL 4 MG/2ML IJ SOLN: 4.0000 mg | Freq: Four times a day (QID) | INTRAMUSCULAR | Status: DC | PRN

## 2014-10-04 NOTE — ED Notes (Signed)
Patient has returned from being out of the department; pt placed on monitor, continuous pulse oximetry and blood pressure cuff

## 2014-10-04 NOTE — ED Notes (Signed)
Robert from [facility] states to call for updates. [phone]

## 2014-10-04 NOTE — ED Provider Notes (Signed)
CSN: [id number]     Arrival date & time 10/04/14  0940 History   First MD Initiated Contact with Patient 10/04/14 [phone]     Chief Complaint  Patient presents with  . Altered Mental Status     (Consider location/radiation/quality/duration/timing/severity/associated sxs/prior Treatment) The history is provided by the EMS personnel. The history is limited by the condition of the patient.  Patient with PMH significant for bipolar disorder with medical noncompliance and drug overdose presents today with AMS after being found by EMS per daughter's phone call.  Patient lives alone in apartment and daughter had not spoken to her in 3 days thus prompting the EMS call.  EMS found her lying on her right side in urine and feces.   Past Medical History  Diagnosis Date  . Bipolar 1 disorder   . Hypothyroid   . Dyslipidemia   . Retinal detachment   . Sleep apnea   . Diabetes mellitus     dx within last yr....just takes pills  . Cancer     cancer of uterus...no chemo or radiation   Past Surgical History  Procedure Laterality Date  . Abdominal hysterectomy    . Total abdominal hysterectomy w/ bilateral salpingoophorectomy    . Tonsillectomy    . Lumbar laminectomy/decompression microdiscectomy  07/17/2011    Procedure: LUMBAR LAMINECTOMY/DECOMPRESSION MICRODISCECTOMY;  Surgeon: Sinclair Ship, MD;  Location: [facility];  Service: Orthopedics;  Laterality: Left;  Left sided lumbar 4-5 microdisectomy   No family history on file. Social History  Substance Use Topics  . Smoking status: Current Every Day Smoker -- 0.25 packs/day for 35 years    Types: Cigarettes    Last Attempt to Quit: 07/09/1999  . Smokeless tobacco: Not on file  . Alcohol Use: No     Comment: socially   OB History    No data available     Review of Systems  Unable to perform ROS: Mental status change      Allergies  Lithium  Home Medications   Prior to Admission medications   Medication Sig Start Date End  Date Taking? Authorizing Provider  aspirin 325 MG EC tablet Take 1 tablet (325 mg total) by mouth daily. For heart health 09/12/14   Encarnacion Slates, NP  atorvastatin (LIPITOR) 40 MG tablet Take 1 tablet (40 mg total) by mouth every evening. For high cholesterol Patient taking differently: Take 40 mg by mouth daily. For high cholesterol 09/12/14   Encarnacion Slates, NP  colchicine 0.6 MG tablet Take 1 tablet (0.6 mg total) by mouth daily. For gout 09/12/14   Encarnacion Slates, NP  cyclobenzaprine (FLEXERIL) 10 MG tablet Take 1 tablet (10 mg total) by mouth 3 (three) times daily as needed for muscle spasms. 09/20/14   Tanna Furry, MD  diclofenac sodium (VOLTAREN) 1 % GEL Apply 1 application topically daily. For arthritic pain 09/12/14   Encarnacion Slates, NP  divalproex (DEPAKOTE ER) 500 MG 24 hr tablet Take 1 tablet (500 mg total) by mouth daily. 09/20/14   Tanna Furry, MD  DULoxetine (CYMBALTA) 30 MG capsule Take 1 capsule (30 mg total) by mouth daily. 09/20/14   Tanna Furry, MD  fluticasone (FLONASE) 50 MCG/ACT nasal spray Place 2 sprays into both nostrils 2 (two) times daily. For allergies 09/12/14   Encarnacion Slates, NP  furosemide (LASIX) 80 MG tablet Take 1 tablet (80 mg total) by mouth daily. 09/20/14   Tanna Furry, MD  gabapentin (NEURONTIN) 300 MG capsule Take 1  capsule (300 mg total) by mouth 3 (three) times daily. 09/20/14   Tanna Furry, MD  ibuprofen (ADVIL,MOTRIN) 200 MG tablet Take 200 mg by mouth every 6 (six) hours as needed for moderate pain.    Historical Provider, MD  levothyroxine (SYNTHROID) 137 MCG tablet Take 137 mcg by mouth daily before breakfast.    Historical Provider, MD  levothyroxine (SYNTHROID, LEVOTHROID) 100 MCG tablet Take 1 tablet (100 mcg total) by mouth daily before breakfast. For low functioning thyroid 09/12/14   Encarnacion Slates, NP  lisinopril (PRINIVIL,ZESTRIL) 5 MG tablet Take 1 tablet (5 mg total) by mouth daily. For high blood pressure 09/12/14   Encarnacion Slates, NP  LORazepam (ATIVAN) 0.5 MG tablet  Take 1 tablet (0.5 mg total) by mouth 2 (two) times daily. For severe anxiety 09/12/14   Encarnacion Slates, NP  meclizine (ANTIVERT) 12.5 MG tablet Take 1 tablet (12.5 mg total) by mouth 3 (three) times daily as needed for dizziness. 09/12/14   Encarnacion Slates, NP  meloxicam (MOBIC) 7.5 MG tablet Take 1 tablet (7.5 mg total) by mouth daily. 09/20/14   Tanna Furry, MD  metFORMIN (GLUCOPHAGE) 500 MG tablet Take 1 tablet (500 mg total) by mouth daily with breakfast. For diabetes mellitus Patient taking differently: Take 500 mg by mouth 2 (two) times daily with a meal. For diabetes mellitus 09/12/14   Encarnacion Slates, NP  metoprolol (LOPRESSOR) 50 MG tablet Take 1 tablet (50 mg total) by mouth 2 (two) times daily with a meal. For high blood pressure 09/12/14   Encarnacion Slates, NP  naproxen sodium (ANAPROX) 220 MG tablet Take 220 mg by mouth 2 (two) times daily as needed (pain).    Historical Provider, MD  nicotine polacrilex (NICORETTE) 2 MG gum Take 1 each (2 mg total) by mouth as needed for smoking cessation. 09/12/14   Encarnacion Slates, NP  potassium chloride 20 MEQ TBCR Take 20 mEq by mouth daily. For low potassium 09/12/14   Encarnacion Slates, NP  QUEtiapine (SEROQUEL) 100 MG tablet Take 1 tablet (100 mg total) by mouth 2 (two) times daily. 09/20/14   Tanna Furry, MD  zolpidem (AMBIEN) 5 MG tablet Take 1 tablet (5 mg total) by mouth at bedtime as needed for sleep. 09/12/14   Encarnacion Slates, NP   There were no vitals taken for this visit. Physical Exam  HENT:  Head: Normocephalic and atraumatic.  Eyes: Pupils are equal, round, and reactive to light.  Neck: Neck supple.  Cardiovascular: S1 normal, S2 normal and intact distal pulses.  Tachycardia present.   Pulmonary/Chest: Effort normal and breath sounds normal.  Abdominal: Soft. Normal appearance and bowel sounds are normal. There is no tenderness.  Neurological: She is disoriented. GCS eye subscore is 4. GCS verbal subscore is 2. GCS motor subscore is 5.  Skin: Skin is  warm.       ED Course  Procedures (including critical care time) Labs Review Labs Reviewed - No data to display  Imaging Review No results found. I have personally reviewed and evaluated these images and lab results as part of my medical decision-making.   EKG Interpretation None      MDM   Final diagnoses:  None   1. Septic Shock -AMS, fever, tachycardia, elevated white count, and hypotension.  Patient received IVF, vanc, and zosyn.  CXR and UA negative.  CT head, cervical spine, abdomen, and chest unremarkable. Lactic acid elevation. -LP performed  2. AMS -most likely  related to sepsis and acute renal failure.    3. Acute Renal Failure -elevated BUN/Cr, patient received IVF in ED concurrent with septic shock protocol  4. Hypernatremia -Elevated sodium, patient received 0.45% NS  4. Humerus fracture -Abnormality was seen as distantly as March    Maveryk Renstrom, [place] 10/04/14 1500  Noemi Chapel, MD 10/04/14 2236

## 2014-10-04 NOTE — H&P (Signed)
[facility] Admission History and Physical Service Pager: [phone]  Patient name: Kelsey Hanna Medical record number: [id number] Date of birth: 07-02-1950 Age: 64 y.o. Gender: female  Primary Care Provider: Saralyn Pilar, MD Consultants: None Code Status: DNR (confirmed via daughter, see below)  Chief Complaint:  Altered metal status, found down  Assessment and Plan: Kelsey Hanna is a 64 y.o. female presenting with altered mental status and fever after being found down in her apartment. PMH is significant for Bipolar Disorder, T2DM, Hypothyroidism, ?afib  # Sepsis with hypotension / Acute toxic metabolic encephalopathy: Fever 104.7, WBC 17.7, hypotensive but responsive to fluids, tachycardia and tachypnea. QSOFA on presentation 3, now 2 with improvement of BP. Labwork significant for severe hypernatremia at 165, SCr 3.07, Bicarb 17. ABG 7.391 / 30.7 / 105 / 18.1. Initial lactate 3.35, repeat 1.98. UA +bili +hyaline casts no nitrites or leukocytes. UDS negative. Pan scan CT abd/chest/head/neck found no acute process found. Significant altered mental status, though improved from basically unresponsive with fluid resuscitation. DDx with fever = need to rule out infectious etiology (though currently without source), treating with broad spectrum antibiotics; initial LP without leukocytosis so less suspicion for meningitis. Medication adherence and uncontrolled bipolar could be contributing, causing AMS and fall leading to current inflammatory picture with dehydration, rhabdo. Additionally she is hypothyroid so myxedema is on the differential as well.  - additional labs: ethanol, APAP, salicylate level, TSH and free T4, RPR - follow LP studies - follow BCx, Ucx - continue Vanc/Zosyn - Add rocephin/ampicillin/acyclovir for meningitis coverage if LP concerning  # Hypernatremia: Na+ 165 at 10am, fluid deficit 6L. Calculated rate to not exceed 0.23mEq/L/hr with  1/2 NS = 197cc/hr. Received 2.5L NS bolus in ED. - fluids at 1/2NS 150cc/hr - recheck Bmet now, then every 4 hours  # Altered Mental status and bipolar- History of Bipolar 1 with current findings as well as reported history concerning for Manic state. Given septic picture with unclear source, must also consider Meningitis contributing as mentioned above. Hypernatremia to 165 also likely contributing. Last TSH was 0.07 hyperthyroid also potentially contributing to her mental status.  - Hold home Quetiapine, cymbalta, depakote until less altered and can take PO, likley in AM - Psych consult, appreciate recs - IV ativan PRN agitation - consider soft restraints if agitation remains an issue - follow LP results as above  # AKI: SCr 3.07, baseline <1. This is likely due to profound dehydration. In setting of hypotension need to be cognisant of ATN. Repeat bmet improved to 2.36 after initial fluid resuscitation. - continue fluids - trending BMET   # Hypothyroid- Now hyperthyroid with last TSH .078, T4 2.10 on 08/27/2014. Potentially secondary to inappropriate dosing (med list has both 172mcg and 135mcg doses, daughter unable to confirm her actual dose).  - Repeat TSH, free T4 - hold Synthroid while NPO, plan on restarting tomorrow as able  DM2- Last A1c 5.9 09/06/2014, stable-  - SSI - CBG ACHS  Code Status- Per daughter, her mother does not wish to be resuscitated in the event of a code - Will change code status to DNR  FEN/GI: 1/2 NS 150 cc/he, NPO until mental status improves Prophylaxis: heparin subq   Disposition: Admit to step down, Dr Ardelia Mems attending  History of Present Illness: History is limited by altered mental status, most of history is per chart review, EMS, and discussion with daughter.  Kelsey Hanna is a 64 y.o. female presenting with fever  and altered mental status after being found down in her apartment lying in feces. Per her daughter, she left the door to her  apartment open and a neighbor found her lying in her own feces and urine and called EMS  On presentation to the ED she was noted to be febrile to 104 F, tachycardic, hypotensive with minimal responsiveness meeting criteria for sepsis. She became more responsive with fluid hydration. She was stared on Vancomycin and Zosyn.   She was then noted to have tangential, pressured speech and mild aggittation, occasionally puling at lines and disrobing. She denied having any pain and kept saying she wanted to be beautiful and it felt good to keep her clothes off because she was beautiful.   Her daughter, [name], was called to gain more information. She is her mother's primary Scientist, research (medical).  Her daughter expressed that she has been increasingly agitated over the last few weeks. When she visited her over these weeks she noted her house was in increasing states of disarray with increasing levels of agitated behavior. However, she was able to hold conversations with her and felt her overall demeanor normal.  She does of note report finding morphine packets at her home on several occasions and recently finding that she has been going to a pain clinic but does not know why. On presentation her UDS was negative for narcotics She reports that over the last several weeks her mother has occasionally reported nausea and emesis, but no fever. She has not witnessed any emesis, diarrhea, chest pain or shortness of breath. She also states that she discussed code status with her  Mother while she was in her usual mental state and she adamantly stated she WOULD NOT want to be resuscitated should she code.    Review Of Systems: Unable to obtain due to altered mental status   Patient Active Problem List   Diagnosis Date Noted  . Septic shock 10/04/2014  . Pedal edema 09/12/2014  . Bipolar disorder, current episode manic without psychotic features, severe   . Bipolar affective disorder, current episode manic  without psychotic symptoms 09/05/2014  . Bipolar affective disorder, current episode manic with psychotic symptoms 09/05/2014  . Bipolar affective disorder, manic 09/03/2014  . Encounter for preadmission testing   . PNA (pneumonia) 04/27/2014  . Type 2 diabetes mellitus 04/27/2014  . Essential hypertension 04/27/2014  . Hypothyroidism 04/27/2014  . Tobacco abuse 04/27/2014  . Bipolar I disorder, most recent episode (or current) unspecified 04/20/2014  . Acute encephalopathy 04/18/2014  . Unresponsiveness   . Acute respiratory failure   . Encounter for feeding tube placement   . Rapid atrial fibrillation   . Shortness of breath    Past Medical History: Past Medical History  Diagnosis Date  . Bipolar 1 disorder   . Hypothyroid   . Dyslipidemia   . Retinal detachment   . Sleep apnea   . Diabetes mellitus     dx within last yr....just takes pills  . Cancer     cancer of uterus...no chemo or radiation   Past Surgical History: Past Surgical History  Procedure Laterality Date  . Abdominal hysterectomy    . Total abdominal hysterectomy w/ bilateral salpingoophorectomy    . Tonsillectomy    . Lumbar laminectomy/decompression microdiscectomy  07/17/2011    Procedure: LUMBAR LAMINECTOMY/DECOMPRESSION MICRODISCECTOMY;  Surgeon: Sinclair Ship, MD;  Location: [facility];  Service: Orthopedics;  Laterality: Left;  Left sided lumbar 4-5 microdisectomy   Social History: Social History  Substance Use Topics  . Smoking status: Current Every Day Smoker -- 0.25 packs/day for 35 years    Types: Cigarettes    Last Attempt to Quit: 07/09/1999  . Smokeless tobacco: None  . Alcohol Use: No     Comment: socially   Additional social history: Occasional etoh, no drugs, smokes cigarettes . Lives alone in retirement home [place]  Please also refer to relevant sections of EMR.  Family History: No family history on file. Allergies and Medications: Allergies  Allergen Reactions  .  Lithium Other (See Comments)    "makes me feel spaced out" slurred speech   No current facility-administered medications on file prior to encounter.   Current Outpatient Prescriptions on File Prior to Encounter  Medication Sig Dispense Refill  . aspirin 325 MG EC tablet Take 1 tablet (325 mg total) by mouth daily. For heart health 30 tablet 0  . atorvastatin (LIPITOR) 40 MG tablet Take 1 tablet (40 mg total) by mouth every evening. For high cholesterol (Patient taking differently: Take 40 mg by mouth daily. For high cholesterol)    . colchicine 0.6 MG tablet Take 1 tablet (0.6 mg total) by mouth daily. For gout    . cyclobenzaprine (FLEXERIL) 10 MG tablet Take 1 tablet (10 mg total) by mouth 3 (three) times daily as needed for muscle spasms. 30 tablet 0  . diclofenac sodium (VOLTAREN) 1 % GEL Apply 1 application topically daily. For arthritic pain    . divalproex (DEPAKOTE ER) 500 MG 24 hr tablet Take 1 tablet (500 mg total) by mouth daily. 10 tablet 0  . DULoxetine (CYMBALTA) 30 MG capsule Take 1 capsule (30 mg total) by mouth daily. 10 capsule 0  . fluticasone (FLONASE) 50 MCG/ACT nasal spray Place 2 sprays into both nostrils 2 (two) times daily. For allergies  2  . furosemide (LASIX) 80 MG tablet Take 1 tablet (80 mg total) by mouth daily. 10 tablet 0  . gabapentin (NEURONTIN) 300 MG capsule Take 1 capsule (300 mg total) by mouth 3 (three) times daily. 30 capsule -  . ibuprofen (ADVIL,MOTRIN) 200 MG tablet Take 200 mg by mouth every 6 (six) hours as needed for moderate pain.    Marland Kitchen levothyroxine (SYNTHROID) 137 MCG tablet Take 137 mcg by mouth daily before breakfast.    . levothyroxine (SYNTHROID, LEVOTHROID) 100 MCG tablet Take 1 tablet (100 mcg total) by mouth daily before breakfast. For low functioning thyroid 30 tablet 0  . lisinopril (PRINIVIL,ZESTRIL) 5 MG tablet Take 1 tablet (5 mg total) by mouth daily. For high blood pressure 30 tablet 0  . LORazepam (ATIVAN) 0.5 MG tablet Take 1  tablet (0.5 mg total) by mouth 2 (two) times daily. For severe anxiety 30 tablet 0  . meclizine (ANTIVERT) 12.5 MG tablet Take 1 tablet (12.5 mg total) by mouth 3 (three) times daily as needed for dizziness. 20 tablet 0  . meloxicam (MOBIC) 7.5 MG tablet Take 1 tablet (7.5 mg total) by mouth daily. 20 tablet 0  . metFORMIN (GLUCOPHAGE) 500 MG tablet Take 1 tablet (500 mg total) by mouth daily with breakfast. For diabetes mellitus (Patient taking differently: Take 500 mg by mouth 2 (two) times daily with a meal. For diabetes mellitus) 30 tablet 0  . metoprolol (LOPRESSOR) 50 MG tablet Take 1 tablet (50 mg total) by mouth 2 (two) times daily with a meal. For high blood pressure 60 tablet 0  . naproxen sodium (ANAPROX) 220 MG tablet Take 220 mg by mouth  2 (two) times daily as needed (pain).    . nicotine polacrilex (NICORETTE) 2 MG gum Take 1 each (2 mg total) by mouth as needed for smoking cessation. 100 tablet 0  . potassium chloride 20 MEQ TBCR Take 20 mEq by mouth daily. For low potassium 15 tablet 0  . QUEtiapine (SEROQUEL) 100 MG tablet Take 1 tablet (100 mg total) by mouth 2 (two) times daily. 20 tablet 0  . zolpidem (AMBIEN) 5 MG tablet Take 1 tablet (5 mg total) by mouth at bedtime as needed for sleep. 7 tablet 0    Objective: BP 122/72 mmHg  Pulse 133  Temp(Src) 100.3 F (37.9 C) (Rectal)  Resp 32  Wt 165 lb (74.844 kg)  SpO2 99% Exam: General: Mild agitation, no acute distress. On presentation initially had gown thrown up exposing her lower body and her face covered with the blanet Eyes: Left eye clouding, right eye Pupil RRL Neck: In c-collar, no tenderness to palpation Cardiovascular: Tachycardic, regular rhythm, no murmurs auscultated Respiratory: CTAB, normal WOB no wheezes, rales or crackes Abdomen: soft, non tender, no organomegally palpated, + BS MSK: good tone Skin: mild bilateral lower extremity mottling, mild bruising on back else no rashes noted Neuro: Disoriented to  person, place and time. Moving all extremities equally. Unable to fully test neuro exam due to patient not following directions Psych: Agitated but pleasant and cooperative. Tangential, pressured speech. Follows some commands (opening eyes and mouth, nothing else) but does not answer questions appropriately.  Labs and Imaging: CBC BMET   Recent Labs Lab 10/04/14 1018  WBC 17.7*  HGB 17.9*  HCT 53.7*  PLT 227    Recent Labs Lab 10/04/14 1018  NA 165*  K 4.0  CL 127*  CO2 17*  BUN 75*  CREATININE 3.07*  GLUCOSE 118*  CALCIUM 9.9      Ct Abdomen Pelvis Wo Contrast  IMPRESSION: No acute finding chest, abdomen or pelvis.  Emphysema.  Calcific aortic and coronary atherosclerosis without aneurysm.  Sigmoid diverticulosis without diverticulitis.   Electronically Signed   By: Inge Rise M.D.   On: 10/04/2014 12:37   Ct Head Wo Contrast  IMPRESSION: 1. Stable atrophy and white matter disease. 2. No acute intracranial abnormality. 3. Stable degenerative changes in the cervical spine. 4. No acute fracture or traumatic subluxation.   Electronically Signed   By: San Morelle M.D.   On: 10/04/2014 12:29   Ct Chest Wo Contrast   IMPRESSION: No acute finding chest, abdomen or pelvis.  Emphysema.  Calcific aortic and coronary atherosclerosis without aneurysm.  Sigmoid diverticulosis without diverticulitis.   Electronically Signed   By: Inge Rise M.D.   On: 10/04/2014 12:37   Ct Cervical Spine Wo Contrast   IMPRESSION: 1. Stable atrophy and white matter disease. 2. No acute intracranial abnormality. 3. Stable degenerative changes in the cervical spine. 4. No acute fracture or traumatic subluxation.   Electronically Signed   By: San Morelle M.D.   On: 10/04/2014 12:29   Dg Chest Port 1 View  10/04/2014   CLINICAL DATA:  Altered mental status.  Question sepsis.  EXAM: PORTABLE CHEST - 1 VIEW  COMPARISON:  09/03/2014  FINDINGS: Multiple old left rib fractures.  Heart and mediastinal contours are within normal limits. No focal opacities or effusions. No acute bony abnormality.  IMPRESSION: No active cardiopulmonary disease.   Electronically Signed   By: Rolm Baptise M.D.   On: 10/04/2014 10:36     Alyssa A Haney,  MD 10/04/2014, 2:43 PM PGY-2, [facility] pager: [phone], text pages welcome  I have seen and examined the patient. I have read and agree with the above note. My changes are noted in blue.  Tawanna Sat, MD 10/04/2014, 4:46 PM PGY-3, [facility] Pager: [phone], text pages welcome

## 2014-10-04 NOTE — ED Notes (Signed)
Found down in apartment. Hx of bipolar and OD on morphine in past. Daughter couldn't get a hold of her x3 days, today had landlord check on her. Found down in her apartment hallway naked, in feces and soiled, lying on right side, right sided gaze.  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 Babbling.   Mottling bilateral LEs.

## 2014-10-04 NOTE — ED Notes (Signed)
MD requesting placement of foley catheter due to pt. Altered mental status and critical status.

## 2014-10-04 NOTE — ED Notes (Signed)
Patient transported to CT 

## 2014-10-04 NOTE — Progress Notes (Signed)
CRITICAL VALUE ALERT  Critical value received:  NA i63  Date of notification:  10/04/2014  Time of notification:  11:08 PM   Critical value read back:Yes.    Nurse who received alert:  Monico Hoar, RN  MD notified (1st page):  Tawanna Sat, MD  Time of first page:  11:09 PM    Responding MD:  Tawanna Sat, MD  Time MD responded:  11:09 PM

## 2014-10-04 NOTE — ED Provider Notes (Signed)
The patient is a 64 year old female.  She presents to the hospital septic appearing, altered, hypotensive and febrile over 104. The patient is unable to give any information however she was found by family members to be unresponsive in a concoction of her own feces and urine on the floor.  The patient is tachycardic to 150 and a sinus tachycardia, she is hypotensive to 85 systolic, febrile to 161, abdomen is soft, no masses, bedside ultrasound reveals no urine in the bladder, her mucous membranes are severely dehydrated, she is mumbling constantly but does not answer questions or follow commands. She does respond to painful stimuli focalizing to the pain. She has no deformity of her 4 extremities but does have some bruising on her back. There is no obvious head injuries, left eye is completely opacified, right eye has a normal-appearing pupil which is nonreactive and 2 mm.  The patient is obviously septic, question pneumonia versus urinary source versus some other source. There is not appear to be a cellulitis. At this time she will need to be aggressively treated for sepsis with IV fluids. I personally placed 2 peripheral IVs. We'll consult with the intensive care unit service. Code sepsis activated. Patient is currently ill but is protecting her airway at this time.  After multiple multiple fluid boluses and anti-pyretics the patient has had some improvement in her mental status. Her blood pressure is in the mid 90s, heart rate is now 115, temperature is coming down and she is now able to answer questions, when asked how she got here she states "I fell", states it was "a long time ago". When asked about her daughter she states "my daughter's a F*&%$ liar"  Critical care has seen the patient and requested admission to the hospitalist service.  Procedure note:  Lumbar puncture  Indication: Altered mental status with fever Consent assumed secondary to altered mental status, emergent conditions and no  family available Time Out Called at start of procedure approximately 3:30 PM Patient positioned in the right lateral decubitus position, skin cleaned extensively with chlorhexidine Assisted by nurse holding patient position, medications given to Younes Degeorge grams of Versed prior to procedure 3-1/2 inch 22-gauge spinal needle advanced in the space at L4-L5 1 attempt, successful return of approximately 8 mL of clear spinal fluid, no red, no xanthochromia, no purulence Patient tolerated procedure without difficulty Site cleaned with warm cloth, bandage placed him and no bleeding Sample sent to lab labeled  CRITICAL CARE Performed by: Johnna Acosta Total critical care time: 35 Critical care time was exclusive of separately billable procedures and treating other patients. Critical care was necessary to treat or prevent imminent or life-threatening deterioration. Critical care was time spent personally by me on the following activities: development of treatment plan with patient and/or surrogate as well as nursing, discussions with consultants, evaluation of patient's response to treatment, examination of patient, obtaining history from patient or surrogate, ordering and performing treatments and interventions, ordering and review of laboratory studies, ordering and review of radiographic studies, pulse oximetry and re-evaluation of patient's condition.  Medical screening examination/treatment/procedure(s) were conducted as a shared visit with non-physician practitioner(s) and myself.  I personally evaluated the patient during the encounter.  Clinical Impression:   Final diagnoses:  Septic shock  Altered mental status, unspecified altered mental status type  Acute renal failure, unspecified acute renal failure type  Hypernatremia  Humerus fracture, left, closed, initial encounter          Noemi Chapel, MD 10/04/14 2236

## 2014-10-04 NOTE — Progress Notes (Signed)
FPTS Interim Progress Note  S: PM Check on patient. On my entering the room she is talking to herself with eyes closed (talks as if another person is in the room, conversation sounds to be about eating food/dinner). She responds immediately to my voice with eye opening and answers questions. Currently complains of being very thirsty, also says she is hungry. Denies any pain. States she is in [facility], [place], and year is 83. 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 No focal deficits appreciated Psych: question of auditory hallucinations but not confirmed by patient.  A/P: Kelsey Hanna is a 64 y.o. female presenting with Acute toxic metabolic encephalopathy. Most recent sodium is still elevated at 165, increased 1/2NS to 175cc/hr and will advance diet to clears (water), asked nurse to observe her drinking at first; okay to drink water overnight (limit to 1.5 L between BMP checks).  Leone Brand, MD 10/04/2014, 9:47 PM PGY-3, [facility] pager [phone]

## 2014-10-04 NOTE — ED Notes (Signed)
Hand carried LP fluid to lab.

## 2014-10-04 NOTE — ED Notes (Signed)
Pt is speaking quickly and about loosely related topics such as making homemade ice cream and then talking about her daughter. Pt thrashing around and attempting to pull out her IVS, foleys. MD paged for orders.

## 2014-10-04 NOTE — ED Notes (Signed)
Pamala Hurry, RN present in room

## 2014-10-04 NOTE — Progress Notes (Signed)
Upon entering room at shift change pt has pulled out one IV and all leads. Pt stated that she was hot. Pt is able to state name, DOB and current location. Will not open eyes unless asked to. MD paged about pts potassium and sodium, he stated he was waiting on a recheck. [place] and bed alarm placed on pt.

## 2014-10-04 NOTE — Progress Notes (Addendum)
ANTIBIOTIC CONSULT NOTE - INITIAL  Pharmacy Consult for vancomycin and Zosyn Indication: rule out sepsis  Allergies  Allergen Reactions  . Lithium Other (See Comments)    "makes me feel spaced out" slurred speech    Patient Measurements: Weight: 164 lb 14.5 oz (74.8 kg)   Vital Signs:   Intake/Output from previous day:   Intake/Output from this shift:    Labs: No results for input(s): WBC, HGB, PLT, LABCREA, CREATININE in the last 72 hours. Estimated Creatinine Clearance: 64.9 mL/min (by C-G formula based on Cr of 0.68). No results for input(s): VANCOTROUGH, VANCOPEAK, VANCORANDOM, GENTTROUGH, GENTPEAK, GENTRANDOM, TOBRATROUGH, TOBRAPEAK, TOBRARND, AMIKACINPEAK, AMIKACINTROU, AMIKACIN in the last 72 hours.   Microbiology: Recent Results (from the past 720 hour(s))  Urine culture     Status: None   Collection Time: 09/08/14  7:28 PM  Result Value Ref Range Status   Specimen Description   Final    URINE, CLEAN CATCH Performed at [facility] Requests   Final    Normal Performed at [facility]    Culture   Final    MULTIPLE SPECIES PRESENT, SUGGEST RECOLLECTION Performed at [facility]    Report Status 09/10/2014 FINAL  Final   Assessment: 74 YOF with history of bipolar disorder, noncompliance and overdose who was found down by her landlord after daughter could not contact x3 days. Tachycardic and hypotensive in ED with fever up to 104. Lactic acid elevated at 3.35  One time doses of vancomycin 1g IV and Zosyn 3.375g IV have been ordered by EDP.  SCr from 8/3 was normal at 0.6, however a CMET has been collected and will await those results prior to entering scheduled antibiotic doses.  Goal of Therapy:  Vancomycin trough level 15-20 mcg/ml  Plan:  -f/u results of today's labs and enter doses accordingly  Minor Iden D. Dosia Yodice, PharmD, BCPS Clinical Pharmacist Pager: [phone] 10/04/2014 10:38  AM  ADDENDUM SCr up to 3.07- with other labs, appears pt is dehydrated which matches presentation picture.  WBC elevated at 17.7, still febrile.  Plan: -Zosyn 2.25g IV q8h -give another 500mg  IV vancomycin x1 in the ED to complete a 20mg /kg load. Then start 1000mg  IV q48h with next dose due 8/19 at 1000 -f/u c/s, clinical progression, renal function, trough at [place] D. Kita Neace, PharmD, BCPS Clinical Pharmacist Pager: [phone] 10/04/2014 11:32 AM

## 2014-10-04 NOTE — ED Notes (Signed)
Pt greeted by critical care team.  Was able to answer questions appropriately, name, her present location, dob.  Pt stated she is thirsty and hungry.  Dr Sabra Heck made aware.  Critical Care will defer to hospitalist.

## 2014-10-04 NOTE — ED Notes (Signed)
Assisted Pamala Hurry, RN with rectal temperature on patient

## 2014-10-04 NOTE — Progress Notes (Addendum)
CRITICAL VALUE ALERT  Critical value received:  NA 165  Date of notification:  10/04/2014   Time of notification:  8:22 PM   Critical value read back:Yes.    Nurse who received alert:  Monico Hoar, RN   MD notified (1st page):  [facility]   Time of first page:  8:23 PM    Responding MD:  Loreta Ave, MD  Time MD responded:  8:37 PM

## 2014-10-05 DIAGNOSIS — G92 Toxic encephalopathy: Secondary | ICD-10-CM

## 2014-10-05 DIAGNOSIS — R7881 Bacteremia: Secondary | ICD-10-CM | POA: Insufficient documentation

## 2014-10-05 DIAGNOSIS — E034 Atrophy of thyroid (acquired): Secondary | ICD-10-CM

## 2014-10-05 DIAGNOSIS — E038 Other specified hypothyroidism: Secondary | ICD-10-CM

## 2014-10-05 LAB — GLUCOSE, CAPILLARY
GLUCOSE-CAPILLARY: 93 mg/dL (ref 65–99)
GLUCOSE-CAPILLARY: 157 mg/dL — AB (ref 65–99)
GLUCOSE-CAPILLARY: 105 mg/dL — AB (ref 65–99)
Glucose-Capillary: 105 mg/dL — ABNORMAL HIGH (ref 65–99)

## 2014-10-05 LAB — CBC
HEMATOCRIT: 44.6 % (ref 36.0–46.0)
Hemoglobin: 14.5 g/dL (ref 12.0–15.0)
MCH: 31 pg (ref 26.0–34.0)
MCHC: 32.5 g/dL (ref 30.0–36.0)
MCV: 95.5 fL (ref 78.0–100.0)
PLATELETS: 140 10*3/uL — AB (ref 150–400)
RBC: 4.67 MIL/uL (ref 3.87–5.11)
RDW: 14.5 % (ref 11.5–15.5)
WBC: 17.8 10*3/uL — ABNORMAL HIGH (ref 4.0–10.5)

## 2014-10-05 LAB — BASIC METABOLIC PANEL WITH GFR
Anion gap: 10 (ref 5–15)
BUN: 44 mg/dL — ABNORMAL HIGH (ref 6–20)
CO2: 18 mmol/L — ABNORMAL LOW (ref 22–32)
Calcium: 8.6 mg/dL — ABNORMAL LOW (ref 8.9–10.3)
Chloride: 123 mmol/L — ABNORMAL HIGH (ref 101–111)
Creatinine, Ser: 1.06 mg/dL — ABNORMAL HIGH (ref 0.44–1.00)
GFR calc Af Amer: >60 mL/min
GFR calc non Af Amer: 54 mL/min — ABNORMAL LOW
Glucose, Bld: 224 mg/dL — ABNORMAL HIGH (ref 65–99)
Potassium: 2.9 mmol/L — ABNORMAL LOW (ref 3.5–5.1)
Sodium: 151 mmol/L — ABNORMAL HIGH (ref 135–145)

## 2014-10-05 LAB — BASIC METABOLIC PANEL
ANION GAP: 10 (ref 5–15)
ANION GAP: 11 (ref 5–15)
ANION GAP: 11 (ref 5–15)
BUN: 58 mg/dL — AB (ref 6–20)
BUN: 44 mg/dL — ABNORMAL HIGH (ref 6–20)
BUN: 36 mg/dL — ABNORMAL HIGH (ref 6–20)
BUN: 38 mg/dL — ABNORMAL HIGH (ref 6–20)
CALCIUM: 8.7 mg/dL — AB (ref 8.9–10.3)
CALCIUM: 8.8 mg/dL — AB (ref 8.9–10.3)
CHLORIDE: 128 mmol/L — AB (ref 101–111)
CHLORIDE: 120 mmol/L — AB (ref 101–111)
CHLORIDE: 124 mmol/L — AB (ref 101–111)
CO2: 21 mmol/L — ABNORMAL LOW (ref 22–32)
CO2: 15 mmol/L — AB (ref 22–32)
CO2: 20 mmol/L — ABNORMAL LOW (ref 22–32)
CO2: 18 mmol/L — AB (ref 22–32)
CREATININE: 1.46 mg/dL — AB (ref 0.44–1.00)
Calcium: 8.4 mg/dL — ABNORMAL LOW (ref 8.9–10.3)
Calcium: 8.7 mg/dL — ABNORMAL LOW (ref 8.9–10.3)
Creatinine, Ser: 1.08 mg/dL — ABNORMAL HIGH (ref 0.44–1.00)
Creatinine, Ser: 1 mg/dL (ref 0.44–1.00)
Creatinine, Ser: 0.87 mg/dL (ref 0.44–1.00)
GFR calc Af Amer: 42 mL/min — ABNORMAL LOW (ref >60)
GFR calc Af Amer: >60 mL/min (ref >60)
GFR calc non Af Amer: 37 mL/min — ABNORMAL LOW (ref >60)
GFR calc non Af Amer: 53 mL/min — ABNORMAL LOW (ref >60)
GFR calc non Af Amer: >60 mL/min (ref >60)
GFR, EST NON AFRICAN AMERICAN: 58 mL/min — AB (ref >60)
GLUCOSE: 97 mg/dL (ref 65–99)
GLUCOSE: 158 mg/dL — AB (ref 65–99)
GLUCOSE: 105 mg/dL — AB (ref 65–99)
Glucose, Bld: 110 mg/dL — ABNORMAL HIGH (ref 65–99)
POTASSIUM: 3.4 mmol/L — AB (ref 3.5–5.1)
POTASSIUM: 5.8 mmol/L — AB (ref 3.5–5.1)
POTASSIUM: 3.6 mmol/L (ref 3.5–5.1)
POTASSIUM: 4.5 mmol/L (ref 3.5–5.1)
SODIUM: 151 mmol/L — AB (ref 135–145)
Sodium: 160 mmol/L — ABNORMAL HIGH (ref 135–145)
Sodium: 153 mmol/L — ABNORMAL HIGH (ref 135–145)
Sodium: 153 mmol/L — ABNORMAL HIGH (ref 135–145)

## 2014-10-05 LAB — RPR: RPR: NONREACTIVE

## 2014-10-05 LAB — URINE CULTURE: Culture: 3000

## 2014-10-05 LAB — HERPES SIMPLEX VIRUS(HSV) DNA BY PCR
HSV 1 DNA: NEGATIVE
HSV 2 DNA: NEGATIVE

## 2014-10-05 LAB — VDRL, CSF: VDRL Quant, CSF: NONREACTIVE

## 2014-10-05 MED ORDER — DULOXETINE HCL 30 MG PO CPEP
30.0000 mg | ORAL_CAPSULE | Freq: Every day | ORAL | Status: DC
  Administered 2014-10-05: 30 mg via ORAL
  Filled 2014-10-05: qty 1

## 2014-10-05 MED ORDER — PNEUMOCOCCAL VAC POLYVALENT 25 MCG/0.5ML IJ INJ
0.5000 mL | INJECTION | INTRAMUSCULAR | Status: AC
  Administered 2014-10-06: 0.5 mL via INTRAMUSCULAR
  Filled 2014-10-05: qty 0.5

## 2014-10-05 MED ORDER — DIVALPROEX SODIUM ER 500 MG PO TB24
500.0000 mg | ORAL_TABLET | Freq: Every day | ORAL | Status: DC
  Administered 2014-10-05 – 2014-10-13 (×9): 500 mg via ORAL
  Filled 2014-10-05 (×10): qty 1

## 2014-10-05 MED ORDER — QUETIAPINE FUMARATE 100 MG PO TABS
100.0000 mg | ORAL_TABLET | Freq: Two times a day (BID) | ORAL | Status: DC
  Administered 2014-10-05: 100 mg via ORAL
  Filled 2014-10-05: qty 1

## 2014-10-05 MED ORDER — POTASSIUM CHLORIDE 10 MEQ/100ML IV SOLN
10.0000 meq | INTRAVENOUS | Status: DC
  Administered 2014-10-05: 10 meq via INTRAVENOUS
  Filled 2014-10-05: qty 100

## 2014-10-05 MED ORDER — VANCOMYCIN HCL IN DEXTROSE 750-5 MG/150ML-% IV SOLN
750.0000 mg | INTRAVENOUS | Status: DC
  Administered 2014-10-05 – 2014-10-06 (×2): 750 mg via INTRAVENOUS
  Filled 2014-10-05 (×3): qty 150

## 2014-10-05 MED ORDER — LEVOTHYROXINE SODIUM 25 MCG PO TABS
137.0000 ug | ORAL_TABLET | Freq: Every day | ORAL | Status: DC
  Administered 2014-10-06 – 2014-10-13 (×8): 137 ug via ORAL
  Filled 2014-10-05 (×12): qty 1

## 2014-10-05 MED ORDER — QUETIAPINE FUMARATE 25 MG PO TABS
100.0000 mg | ORAL_TABLET | Freq: Three times a day (TID) | ORAL | Status: DC
  Administered 2014-10-05 – 2014-10-13 (×23): 100 mg via ORAL
  Filled 2014-10-05 (×3): qty 1
  Filled 2014-10-05: qty 4
  Filled 2014-10-05 (×11): qty 1
  Filled 2014-10-05: qty 4
  Filled 2014-10-05 (×4): qty 1
  Filled 2014-10-05: qty 4
  Filled 2014-10-05: qty 1
  Filled 2014-10-05 (×2): qty 4
  Filled 2014-10-05: qty 1
  Filled 2014-10-05: qty 4
  Filled 2014-10-05 (×3): qty 1

## 2014-10-05 MED ORDER — DEXTROSE 5 % IV SOLN
INTRAVENOUS | Status: DC
  Administered 2014-10-05 – 2014-10-06 (×3): via INTRAVENOUS

## 2014-10-05 NOTE — Progress Notes (Addendum)
ANTIBIOTIC CONSULT NOTE - INITIAL  Pharmacy Consult for vancomycin Indication: bacteremia  Allergies  Allergen Reactions  . Haldol [Haloperidol] Other (See Comments)    Shaking of the hands, grinding her teeth  . Lithium Other (See Comments)    "makes me feel spaced out" slurred speech    Patient Measurements: Height: 5\' 4"  (162.6 cm) Weight: 143 lb (64.864 kg) IBW/kg (Calculated) : 54.7  Vital Signs: Temp: 98.3 F (36.8 C) (08/18 1240) Temp Source: Oral (08/18 1240) BP: 148/79 mmHg (08/18 1240) Pulse Rate: 104 (08/18 1328) Intake/Output from previous day: 08/17 0701 - 08/18 0700 In: 3143.3 [I.V.:3143.3] Out: 350 [Urine:350] Intake/Output from this shift: Total I/O In: 103 [I.V.:103] Out: -   Labs:  Recent Labs  10/04/14 1018  10/04/14 1954 10/04/14 2230 10/05/14 0405  WBC 17.7*  --   --   --  17.8*  HGB 17.9*  --   --   --  14.5  PLT 227  --   --   --  140*  CREATININE 3.07*  < > 2.03* 1.85* 1.46*  < > = values in this interval not displayed. Estimated Creatinine Clearance: 33.2 mL/min (by C-G formula based on Cr of 1.46). No results for input(s): VANCOTROUGH, VANCOPEAK, VANCORANDOM, GENTTROUGH, GENTPEAK, GENTRANDOM, TOBRATROUGH, TOBRAPEAK, TOBRARND, AMIKACINPEAK, AMIKACINTROU, AMIKACIN in the last 72 hours.   Microbiology: Recent Results (from the past 720 hour(s))  Urine culture     Status: None   Collection Time: 09/08/14  7:28 PM  Result Value Ref Range Status   Specimen Description   Final    URINE, CLEAN CATCH Performed at [place] Requests   Final    Normal Performed at [facility]    Culture   Final    MULTIPLE SPECIES PRESENT, SUGGEST RECOLLECTION Performed at [facility]    Report Status 09/10/2014 FINAL  Final  Blood Culture (routine x 2)     Status: None (Preliminary result)   Collection Time: 10/04/14 10:24 AM  Result Value Ref Range Status   Specimen Description BLOOD RIGHT  ANTECUBITAL  Final   Special Requests BOTTLES DRAWN AEROBIC AND ANAEROBIC 5CC  Final   Culture  Setup Time   Final    GRAM POSITIVE COCCI IN CLUSTERS ANAEROBIC BOTTLE ONLY CRITICAL RESULT CALLED TO, READ BACK BY AND VERIFIED WITH: Alyssa Grove RN [phone] GREEN R CONFIRMED BY J. HOLMES    Culture PENDING  Incomplete   Report Status PENDING  Incomplete  Urine culture     Status: None   Collection Time: 10/04/14 11:07 AM  Result Value Ref Range Status   Specimen Description URINE, CATHETERIZED  Final   Special Requests NONE  Final   Culture 3,000 COLONIES/mL INSIGNIFICANT GROWTH  Final   Report Status 10/05/2014 FINAL  Final  CSF culture with Stat gram stain     Status: None (Preliminary result)   Collection Time: 10/04/14  3:05 PM  Result Value Ref Range Status   Specimen Description CSF  Final   Special Requests Normal  Final   Gram Stain   Final    WBC PRESENT, PREDOMINANTLY MONONUCLEAR NO ORGANISMS SEEN CYTOSPIN    Culture NO GROWTH < 24 HOURS  Final   Report Status PENDING  Incomplete  MRSA PCR Screening     Status: None   Collection Time: 10/04/14  8:02 PM  Result Value Ref Range Status   MRSA by PCR NEGATIVE NEGATIVE Final    Comment:  The GeneXpert MRSA Assay (FDA approved for NASAL specimens only), is one component of a comprehensive MRSA colonization surveillance program. It is not intended to diagnose MRSA infection nor to guide or monitor treatment for MRSA infections.     Medical History: Past Medical History  Diagnosis Date  . Bipolar 1 disorder   . Hypothyroid   . Dyslipidemia   . Retinal detachment   . Sleep apnea   . Diabetes mellitus     dx within last yr....just takes pills  . Cancer     cancer of uterus...no chemo or radiation   Assessment: 15 YOF with history of bipolar disorder, noncompliance and overdose who was found down by her landlord after daughter could not contact x3 days. Tachycardic and hypotensive in ED with fever up to  104. Lactic acid elevated at 3.35. Blood cx is now 1/2 for GPC in clusters. SCr trending down to 1.42, CrCl ~25ml/min. Afebrile, WBC elevated at 17.8.   Goal of Therapy:  Vancomycin trough level 15-20 mcg/ml  Resolution of infection  Plan:  Zosyn stopped per MD today due to possible swelling. Consider adding as allergy if that is the case Start vancomycin 750mg  IV q24h Monitor clinical picture, renal function, VT at Css F/U C&S, abx deescalation / LOT  Jawanda Passey J 10/05/2014,1:48 PM

## 2014-10-05 NOTE — Consult Note (Signed)
[place] Consult   Reason for Consult:  Bipolar disorder and altered mental status Referring Physician:  Dr. Ardelia Mems Patient Identification: Kelsey Hanna MRN:  [id number] Principal Diagnosis: Altered mental status Diagnosis:   Patient Active Problem List   Diagnosis Date Noted  . Sepsis with hypotension [A41.9] 10/04/2014  . Toxic metabolic encephalopathy [R94] 10/04/2014  . AKI (acute kidney injury) [N17.9] 10/04/2014  . Acute renal failure syndrome [N17.9]   . Altered mental status [R41.82]   . Hypernatremia [E87.0]   . Pedal edema [R60.0] 09/12/2014  . Bipolar disorder, current episode manic without psychotic features, severe [F31.13]   . Bipolar affective disorder, current episode manic without psychotic symptoms [F31.10] 09/05/2014  . Bipolar affective disorder, current episode manic with psychotic symptoms [F31.2] 09/05/2014  . Bipolar affective disorder, manic [F31.10] 09/03/2014  . Encounter for preadmission testing [Z01.818]   . PNA (pneumonia) [J18.9] 04/27/2014  . Type 2 diabetes mellitus [E11.9] 04/27/2014  . Essential hypertension [I10] 04/27/2014  . Hypothyroidism [E03.9] 04/27/2014  . Tobacco abuse [Z72.0] 04/27/2014  . Bipolar I disorder, most recent episode (or current) unspecified [F31.9] 04/20/2014  . Acute encephalopathy [G93.40] 04/18/2014  . Unresponsiveness [R40.4]   . Acute respiratory failure [J96.00]   . Encounter for feeding tube placement [Z87.898]   . Rapid atrial fibrillation [I48.91]   . Shortness of breath [R06.02]     Total Time spent with patient: 45 minutes  Subjective:   Kelsey Hanna is a 64 y.o. female patient admitted with bipolar mania and altered mental status.  HPI: Kelsey Hanna is a 64 y.o. female admitted to [facility] with altered mental status and history of bipolar disorder. Patient has been living by herself and has been in good contact with her daughter. Patient presented with  high fever with 104, tachycardiac, hypotensive with minimal responsiveness. Patient is awake but not alert and oriented during this evaluation. Patient is a poor historian. Review of electronic medical records indicated patient was recently admitted to [facility] for bipolar mania symptoms and discharged to outpatient care in July 2016 when she was stabilized on medication management. She has tangential, pressured speech and mild aggittation, occasionally puling at lines and disrobing. Her daughter, [name] is her mother's primary Scientist, research (medical). Her UDS was negative for narcotics. Patient cannot contract for safety and does not meet criteria for capacity to make her own medical decisions at this time.  HPI Elements:   Location:  Sepsis and bipolar mania. Quality:  Poor. Severity:  Unable to care for herself. Timing:  Altered mental status for unknown reason. Duration:  Less than 4 weeks. Context:  Psychosocial stressors..  Past Medical History:  Past Medical History  Diagnosis Date  . Bipolar 1 disorder   . Hypothyroid   . Dyslipidemia   . Retinal detachment   . Sleep apnea   . Diabetes mellitus     dx within last yr....just takes pills  . Cancer     cancer of uterus...no chemo or radiation    Past Surgical History  Procedure Laterality Date  . Abdominal hysterectomy    . Total abdominal hysterectomy w/ bilateral salpingoophorectomy    . Tonsillectomy    . Lumbar laminectomy/decompression microdiscectomy  07/17/2011    Procedure: LUMBAR LAMINECTOMY/DECOMPRESSION MICRODISCECTOMY;  Surgeon: Sinclair Ship, MD;  Location: [place];  Service: Orthopedics;  Laterality: Left;  Left sided lumbar 4-5 microdisectomy   Family History: No family history on file. Social History:  History  Alcohol Use No    Comment: socially     History  Drug Use No    Social History   Social History  . Marital Status: Divorced    Spouse Name: N/A  . Number  of Children: N/A  . Years of Education: N/A   Social History Main Topics  . Smoking status: Current Every Day Smoker -- 0.25 packs/day for 35 years    Types: Cigarettes    Last Attempt to Quit: 07/09/1999  . Smokeless tobacco: None  . Alcohol Use: No     Comment: socially  . Drug Use: No  . Sexual Activity: Not Asked   Other Topics Concern  . None   Social History Narrative   Additional Social History:                          Allergies:   Allergies  Allergen Reactions  . Haldol [Haloperidol] Other (See Comments)    Shaking of the hands, grinding her teeth  . Lithium Other (See Comments)    "makes me feel spaced out" slurred speech    Labs:  Results for orders placed or performed during the hospital encounter of 10/04/14 (from the past 48 hour(s))  Comprehensive metabolic panel     Status: Abnormal   Collection Time: 10/04/14 10:18 AM  Result Value Ref Range   Sodium 165 (HH) 135 - 145 mmol/L    Comment: CRITICAL RESULT CALLED TO, READ BACK BY AND VERIFIED WITH: E.ZAMORA,RN 10/04/14 1121 BY BSLADE    Potassium 4.0 3.5 - 5.1 mmol/L   Chloride 127 (H) 101 - 111 mmol/L   CO2 17 (L) 22 - 32 mmol/L   Glucose, Bld 118 (H) 65 - 99 mg/dL   BUN 75 (H) 6 - 20 mg/dL   Creatinine, Ser 2.83 (H) 0.44 - 1.00 mg/dL   Calcium 9.9 8.9 - 29.9 mg/dL   Total Protein 7.8 6.5 - 8.1 g/dL   Albumin 4.3 3.5 - 5.0 g/dL   AST 78 (H) 15 - 41 U/L   ALT 54 14 - 54 U/L   Alkaline Phosphatase 63 38 - 126 U/L   Total Bilirubin 1.7 (H) 0.3 - 1.2 mg/dL   GFR calc non Af Amer 15 (L) >60 mL/min   GFR calc Af Amer 17 (L) >60 mL/min    Comment: (NOTE) The eGFR has been calculated using the CKD EPI equation. This calculation has not been validated in all clinical situations. eGFR's persistently <60 mL/min signify possible Chronic Kidney Disease.    Anion gap 21 (H) 5 - 15  CBC WITH DIFFERENTIAL     Status: Abnormal   Collection Time: 10/04/14 10:18 AM  Result Value Ref Range   WBC  17.7 (H) 4.0 - 10.5 K/uL   RBC 5.68 (H) 3.87 - 5.11 MIL/uL   Hemoglobin 17.9 (H) 12.0 - 15.0 g/dL   HCT 99.2 (H) 09.4 - 99.0 %   MCV 94.5 78.0 - 100.0 fL   MCH 31.5 26.0 - 34.0 pg   MCHC 33.3 30.0 - 36.0 g/dL   RDW 83.2 47.8 - 03.7 %   Platelets 227 150 - 400 K/uL    Comment: REPEATED TO VERIFY PLATELET COUNT CONFIRMED BY SMEAR    Neutrophils Relative % 73 43 - 77 %   Lymphocytes Relative 14 12 - 46 %   Monocytes Relative 13 (H) 3 - 12 %   Eosinophils Relative 0 0 - 5 %  Basophils Relative 0 0 - 1 %   Neutro Abs 12.9 (H) 1.7 - 7.7 K/uL   Lymphs Abs 2.5 0.7 - 4.0 K/uL   Monocytes Absolute 2.3 (H) 0.1 - 1.0 K/uL   Eosinophils Absolute 0.0 0.0 - 0.7 K/uL   Basophils Absolute 0.0 0.0 - 0.1 K/uL   Smear Review MORPHOLOGY UNREMARKABLE   Blood Culture (routine x 2)     Status: None (Preliminary result)   Collection Time: 10/04/14 10:18 AM  Result Value Ref Range   Specimen Description BLOOD HAND LEFT    Special Requests BOTTLES DRAWN AEROBIC AND ANAEROBIC 5CC    Culture NO GROWTH 1 DAY    Report Status PENDING   Valproic acid level     Status: Abnormal   Collection Time: 10/04/14 10:18 AM  Result Value Ref Range   Valproic Acid Lvl <10 (L) 50.0 - 100.0 ug/mL    Comment: RESULTS CONFIRMED BY MANUAL DILUTION  CK     Status: Abnormal   Collection Time: 10/04/14 10:18 AM  Result Value Ref Range   Total CK 674 (H) 38 - 234 U/L  Blood Culture (routine x 2)     Status: None (Preliminary result)   Collection Time: 10/04/14 10:24 AM  Result Value Ref Range   Specimen Description BLOOD RIGHT ANTECUBITAL    Special Requests BOTTLES DRAWN AEROBIC AND ANAEROBIC 5CC    Culture  Setup Time      GRAM POSITIVE COCCI IN CLUSTERS ANAEROBIC BOTTLE ONLY CRITICAL RESULT CALLED TO, READ BACK BY AND VERIFIED WITH: Alyssa Grove RN [phone] GREEN R CONFIRMED BY J. HOLMES    Culture NO GROWTH 1 DAY    Report Status PENDING   I-Stat CG4 Lactic Acid, ED  (not at  [facility])     Status: Abnormal    Collection Time: 10/04/14 10:31 AM  Result Value Ref Range   Lactic Acid, Venous 3.35 (HH) 0.5 - 2.0 mmol/L   Comment NOTIFIED PHYSICIAN   I-Stat arterial blood gas, ED     Status: Abnormal   Collection Time: 10/04/14 10:44 AM  Result Value Ref Range   pH, Arterial 7.391 7.350 - 7.450   pCO2 arterial 30.7 (L) 35.0 - 45.0 mmHg   pO2, Arterial 105.0 (H) 80.0 - 100.0 mmHg   Bicarbonate 18.1 (L) 20.0 - 24.0 mEq/L   TCO2 19 0 - 100 mmol/L   O2 Saturation 97.0 %   Acid-base deficit 5.0 (H) 0.0 - 2.0 mmol/L   Patient temperature 104.7 F    Collection site RADIAL, ALLEN'S TEST ACCEPTABLE    Drawn by Operator    Sample type ARTERIAL   Urinalysis, Routine w reflex microscopic (not at [facility])     Status: Abnormal   Collection Time: 10/04/14 11:07 AM  Result Value Ref Range   Color, Urine AMBER (A) YELLOW    Comment: BIOCHEMICALS MAY BE AFFECTED BY COLOR   APPearance CLOUDY (A) CLEAR   Specific Gravity, Urine 1.024 1.005 - 1.030   pH 5.0 5.0 - 8.0   Glucose, UA NEGATIVE NEGATIVE mg/dL   Hgb urine dipstick NEGATIVE NEGATIVE   Bilirubin Urine LARGE (A) NEGATIVE   Ketones, ur 15 (A) NEGATIVE mg/dL   Protein, ur 30 (A) NEGATIVE mg/dL   Urobilinogen, UA 1.0 0.0 - 1.0 mg/dL   Nitrite NEGATIVE NEGATIVE   Leukocytes, UA NEGATIVE NEGATIVE  Urine culture     Status: None   Collection Time: 10/04/14 11:07 AM  Result Value Ref Range   Specimen Description  URINE, CATHETERIZED    Special Requests NONE    Culture 3,000 COLONIES/mL INSIGNIFICANT GROWTH    Report Status 10/05/2014 FINAL   Urine rapid drug screen (hosp performed)     Status: None   Collection Time: 10/04/14 11:07 AM  Result Value Ref Range   Opiates NONE DETECTED NONE DETECTED   Cocaine NONE DETECTED NONE DETECTED   Benzodiazepines NONE DETECTED NONE DETECTED   Amphetamines NONE DETECTED NONE DETECTED   Tetrahydrocannabinol NONE DETECTED NONE DETECTED   Barbiturates NONE DETECTED NONE DETECTED    Comment:        DRUG SCREEN FOR  MEDICAL PURPOSES ONLY.  IF CONFIRMATION IS NEEDED FOR ANY PURPOSE, NOTIFY LAB WITHIN 5 DAYS.        LOWEST DETECTABLE LIMITS FOR URINE DRUG SCREEN Drug Class       Cutoff (ng/mL) Amphetamine      1000 Barbiturate      200 Benzodiazepine   200 Tricyclics       300 Opiates          300 Cocaine          300 THC              50   Urine microscopic-add on     Status: Abnormal   Collection Time: 10/04/14 11:07 AM  Result Value Ref Range   Squamous Epithelial / LPF RARE RARE   WBC, UA 0-2 <3 WBC/hpf   RBC / HPF 0-2 <3 RBC/hpf   Bacteria, UA RARE RARE   Casts HYALINE CASTS (A) NEGATIVE   Urine-Other LESS THAN 10 mL OF URINE SUBMITTED     Comment: AMORPHOUS URATES/PHOSPHATES  I-Stat CG4 Lactic Acid, ED  (not at  [facility])     Status: None   Collection Time: 10/04/14  2:44 PM  Result Value Ref Range   Lactic Acid, Venous 1.98 0.5 - 2.0 mmol/L  RPR     Status: None   Collection Time: 10/04/14  2:45 PM  Result Value Ref Range   RPR Ser Ql Non Reactive Non Reactive    Comment: (NOTE) Performed At: [facility] [address] [place], [place] [id number] Mila Homer MD XH:[id number]   Acetaminophen level     Status: Abnormal   Collection Time: 10/04/14  2:45 PM  Result Value Ref Range   Acetaminophen (Tylenol), Serum <10 (L) 10 - 30 ug/mL    Comment:        THERAPEUTIC CONCENTRATIONS VARY SIGNIFICANTLY. A RANGE OF 10-30 ug/mL MAY BE AN EFFECTIVE CONCENTRATION FOR MANY PATIENTS. HOWEVER, SOME ARE BEST TREATED AT CONCENTRATIONS OUTSIDE THIS RANGE. ACETAMINOPHEN CONCENTRATIONS >150 ug/mL AT 4 HOURS AFTER INGESTION AND >50 ug/mL AT 12 HOURS AFTER INGESTION ARE OFTEN ASSOCIATED WITH TOXIC REACTIONS.   Salicylate level     Status: None   Collection Time: 10/04/14  2:45 PM  Result Value Ref Range   Salicylate Lvl <4.0 2.8 - 30.0 mg/dL  TSH     Status: Abnormal   Collection Time: 10/04/14  2:45 PM  Result Value Ref Range   TSH 0.214 (L) 0.350 - 4.500 uIU/mL  T4,  free     Status: None   Collection Time: 10/04/14  2:45 PM  Result Value Ref Range   Free T4 0.94 0.61 - 1.12 ng/dL  Ethanol     Status: None   Collection Time: 10/04/14  2:45 PM  Result Value Ref Range   Alcohol, Ethyl (B) <5 <5 mg/dL    Comment:  LOWEST DETECTABLE LIMIT FOR SERUM ALCOHOL IS 5 mg/dL FOR MEDICAL PURPOSES ONLY   Protein and glucose, CSF     Status: Abnormal   Collection Time: 10/04/14  3:05 PM  Result Value Ref Range   Glucose, CSF 73 (H) 40 - 70 mg/dL   Total  Protein, CSF 24 15 - 45 mg/dL  CSF culture with Stat gram stain     Status: None (Preliminary result)   Collection Time: 10/04/14  3:05 PM  Result Value Ref Range   Specimen Description CSF    Special Requests Normal    Gram Stain      WBC PRESENT, PREDOMINANTLY MONONUCLEAR NO ORGANISMS SEEN CYTOSPIN    Culture NO GROWTH < 24 HOURS    Report Status PENDING   CSF cell count with differential collection tube #: 1     Status: Abnormal   Collection Time: 10/04/14  3:05 PM  Result Value Ref Range   Tube # 1    Color, CSF COLORLESS COLORLESS   Appearance, CSF CLEAR CLEAR   Supernatant NOT INDICATED    RBC Count, CSF 1 (H) 0 /cu mm   WBC, CSF 2 0 - 5 /cu mm   Other Cells, CSF TOO FEW TO COUNT, SMEAR AVAILABLE FOR REVIEW     Comment: RARE MONOCYTES AND RARE LYMPHOCYTES PRESENT  CSF cell count with differential collection tube #: 4     Status: Abnormal   Collection Time: 10/04/14  3:05 PM  Result Value Ref Range   Tube # 4    Color, CSF COLORLESS COLORLESS   Appearance, CSF CLEAR CLEAR   Supernatant NOT INDICATED    RBC Count, CSF 2 (H) 0 /cu mm   WBC, CSF 2 0 - 5 /cu mm   Other Cells, CSF TOO FEW TO COUNT, SMEAR AVAILABLE FOR REVIEW     Comment: RARE MONOCYTES AND RARE LYMPHOCYTES SEEN  Cryptococcal antigen, CSF     Status: None   Collection Time: 10/04/14  3:05 PM  Result Value Ref Range   Crypto Ag NEGATIVE NEGATIVE   Cryptococcal Ag Titer NOT INDICATED NOT INDICATED  VDRL, CSF      Status: None   Collection Time: 10/04/14  3:05 PM  Result Value Ref Range   VDRL Quant, CSF Non Reactive Non Rea:<1:1    Comment: (NOTE) Performed At: [facility] [address] [place], [place] [id number] Mila Homer MD PO:[id number]   Basic metabolic panel     Status: Abnormal   Collection Time: 10/04/14  3:42 PM  Result Value Ref Range   Sodium 162 (HH) 135 - 145 mmol/L    Comment: CRITICAL RESULT CALLED TO, READ BACK BY AND VERIFIED WITH: A REABOLD,RN 1620 10/04/14 D BRADLEY    Potassium 2.9 (L) 3.5 - 5.1 mmol/L    Comment: DELTA CHECK NOTED   Chloride 128 (H) 101 - 111 mmol/L   CO2 20 (L) 22 - 32 mmol/L   Glucose, Bld 127 (H) 65 - 99 mg/dL   BUN 68 (H) 6 - 20 mg/dL   Creatinine, Ser 1.10 (H) 0.44 - 1.00 mg/dL   Calcium 8.2 (L) 8.9 - 10.3 mg/dL   GFR calc non Af Amer 20 (L) >60 mL/min   GFR calc Af Amer 24 (L) >60 mL/min    Comment: (NOTE) The eGFR has been calculated using the CKD EPI equation. This calculation has not been validated in all clinical situations. eGFR's persistently <60 mL/min signify possible Chronic Kidney Disease.  Anion gap 14 5 - 15  Basic metabolic panel     Status: Abnormal   Collection Time: 10/04/14  7:54 PM  Result Value Ref Range   Sodium 165 (HH) 135 - 145 mmol/L    Comment: CRITICAL RESULT CALLED TO, READ BACK BY AND VERIFIED WITH: [facility] 2022 10/04/14 D BRADLEY    Potassium 3.4 (L) 3.5 - 5.1 mmol/L   Chloride 129 (H) 101 - 111 mmol/L   CO2 17 (L) 22 - 32 mmol/L   Glucose, Bld 110 (H) 65 - 99 mg/dL   BUN 69 (H) 6 - 20 mg/dL   Creatinine, Ser 2.03 (H) 0.44 - 1.00 mg/dL   Calcium 8.3 (L) 8.9 - 10.3 mg/dL   GFR calc non Af Amer 25 (L) >60 mL/min   GFR calc Af Amer 28 (L) >60 mL/min    Comment: (NOTE) The eGFR has been calculated using the CKD EPI equation. This calculation has not been validated in all clinical situations. eGFR's persistently <60 mL/min signify possible Chronic Kidney Disease.    Anion gap 19 (H)  5 - 15  MRSA PCR Screening     Status: None   Collection Time: 10/04/14  8:02 PM  Result Value Ref Range   MRSA by PCR NEGATIVE NEGATIVE    Comment:        The GeneXpert MRSA Assay (FDA approved for NASAL specimens only), is one component of a comprehensive MRSA colonization surveillance program. It is not intended to diagnose MRSA infection nor to guide or monitor treatment for MRSA infections.   Glucose, capillary     Status: Abnormal   Collection Time: 10/04/14  8:08 PM  Result Value Ref Range   Glucose-Capillary 103 (H) 65 - 99 mg/dL  Glucose, capillary     Status: Abnormal   Collection Time: 10/04/14  9:59 PM  Result Value Ref Range   Glucose-Capillary 102 (H) 65 - 99 mg/dL  Basic metabolic panel     Status: Abnormal   Collection Time: 10/04/14 10:30 PM  Result Value Ref Range   Sodium 163 (HH) 135 - 145 mmol/L    Comment: CRITICAL RESULT CALLED TO, READ BACK BY AND VERIFIED WITH: PIEL M,RN 10/04/14 2305 WAYK    Potassium 3.0 (L) 3.5 - 5.1 mmol/L   Chloride 128 (H) 101 - 111 mmol/L   CO2 23 22 - 32 mmol/L   Glucose, Bld 110 (H) 65 - 99 mg/dL   BUN 63 (H) 6 - 20 mg/dL   Creatinine, Ser 1.85 (H) 0.44 - 1.00 mg/dL   Calcium 8.4 (L) 8.9 - 10.3 mg/dL   GFR calc non Af Amer 27 (L) >60 mL/min   GFR calc Af Amer 32 (L) >60 mL/min    Comment: (NOTE) The eGFR has been calculated using the CKD EPI equation. This calculation has not been validated in all clinical situations. eGFR's persistently <60 mL/min signify possible Chronic Kidney Disease.    Anion gap 12 5 - 15  CBC     Status: Abnormal   Collection Time: 10/05/14  4:05 AM  Result Value Ref Range   WBC 17.8 (H) 4.0 - 10.5 K/uL   RBC 4.67 3.87 - 5.11 MIL/uL   Hemoglobin 14.5 12.0 - 15.0 g/dL   HCT 44.6 36.0 - 46.0 %   MCV 95.5 78.0 - 100.0 fL   MCH 31.0 26.0 - 34.0 pg   MCHC 32.5 30.0 - 36.0 g/dL   RDW 14.5 11.5 - 15.5 %   Platelets 140 (  L) 150 - 400 K/uL  Basic metabolic panel     Status: Abnormal    Collection Time: 10/05/14  4:05 AM  Result Value Ref Range   Sodium 160 (H) 135 - 145 mmol/L   Potassium 3.4 (L) 3.5 - 5.1 mmol/L   Chloride >130 (HH) 101 - 111 mmol/L    Comment: CRITICAL RESULT CALLED TO, READ BACK BY AND VERIFIED WITH: INGRAM M,RN 10/05/14 0436 WAYK    CO2 21 (L) 22 - 32 mmol/L   Glucose, Bld 97 65 - 99 mg/dL   BUN 58 (H) 6 - 20 mg/dL   Creatinine, Ser 1.46 (H) 0.44 - 1.00 mg/dL   Calcium 8.4 (L) 8.9 - 10.3 mg/dL   GFR calc non Af Amer 37 (L) >60 mL/min   GFR calc Af Amer 42 (L) >60 mL/min    Comment: (NOTE) The eGFR has been calculated using the CKD EPI equation. This calculation has not been validated in all clinical situations. eGFR's persistently <60 mL/min signify possible Chronic Kidney Disease.   Glucose, capillary     Status: None   Collection Time: 10/05/14  7:46 AM  Result Value Ref Range   Glucose-Capillary 93 65 - 99 mg/dL   Comment 1 Notify RN    Comment 2 Document in Chart   Glucose, capillary     Status: Abnormal   Collection Time: 10/05/14 12:27 PM  Result Value Ref Range   Glucose-Capillary 105 (H) 65 - 99 mg/dL   Comment 1 Notify RN    Comment 2 Document in Chart   Basic metabolic panel     Status: Abnormal   Collection Time: 10/05/14  3:08 PM  Result Value Ref Range   Sodium 151 (H) 135 - 145 mmol/L   Potassium 2.9 (L) 3.5 - 5.1 mmol/L   Chloride 123 (H) 101 - 111 mmol/L   CO2 18 (L) 22 - 32 mmol/L   Glucose, Bld 224 (H) 65 - 99 mg/dL   BUN 44 (H) 6 - 20 mg/dL   Creatinine, Ser 1.06 (H) 0.44 - 1.00 mg/dL   Calcium 8.6 (L) 8.9 - 10.3 mg/dL   GFR calc non Af Amer 54 (L) >60 mL/min   GFR calc Af Amer >60 >60 mL/min    Comment: (NOTE) The eGFR has been calculated using the CKD EPI equation. This calculation has not been validated in all clinical situations. eGFR's persistently <60 mL/min signify possible Chronic Kidney Disease.    Anion gap 10 5 - 15    Vitals: Blood pressure 148/79, pulse 104, temperature 98.3 F (36.8 C),  temperature source Oral, resp. rate 18, height $RemoveBe'5\' 4"'LOWKBbORi$  (1.626 m), weight 64.864 kg (143 lb), SpO2 96 %.  Risk to Self: Is patient at risk for suicide?: No Risk to Others:   Prior Inpatient Therapy:   Prior Outpatient Therapy:    Current Facility-Administered Medications  Medication Dose Route Frequency Provider Last Rate Last Dose  . acetaminophen (TYLENOL) tablet 650 mg  650 mg Oral Q6H PRN Leone Brand, MD       Or  . acetaminophen (TYLENOL) suppository 650 mg  650 mg Rectal Q6H PRN Leone Brand, MD      . antiseptic oral rinse (CPC / CETYLPYRIDINIUM CHLORIDE 0.05%) solution 7 mL  7 mL Mouth Rinse BID Leeanne Rio, MD   7 mL at 10/05/14 1000  . dextrose 5 % solution   Intravenous Continuous Leone Brand, MD 100 mL/hr at 10/05/14 [id number]    .  divalproex (DEPAKOTE ER) 24 hr tablet 500 mg  500 mg Oral Daily Alyssa A Haney, MD   500 mg at 10/05/14 1057  . heparin injection 5,000 Units  5,000 Units Subcutaneous 3 times per day Leone Brand, MD   5,000 Units at 10/05/14 1400  . insulin aspart (novoLOG) injection 0-5 Units  0-5 Units Subcutaneous QHS Leone Brand, MD   0 Units at 10/04/14 2200  . insulin aspart (novoLOG) injection 0-9 Units  0-9 Units Subcutaneous TID WC Leone Brand, MD   0 Units at 10/05/14 0800  . [START ON 10/06/2014] levothyroxine (SYNTHROID, LEVOTHROID) tablet 137 mcg  137 mcg Oral QAC breakfast Alyssa A Haney, MD      . LORazepam (ATIVAN) injection 1 mg  1 mg Intravenous Q4H PRN Veatrice Bourbon, MD   1 mg at 10/04/14 2247  . ondansetron (ZOFRAN) tablet 4 mg  4 mg Oral Q6H PRN Leone Brand, MD       Or  . ondansetron [facility]) injection 4 mg  4 mg Intravenous Q6H PRN Leone Brand, MD      . potassium chloride 10 mEq in 100 mL IVPB  10 mEq Intravenous Q1 Hr x 4 Alyssa A Haney, MD      . QUEtiapine (SEROQUEL) tablet 100 mg  100 mg Oral TID Ambrose Finland, MD      . sodium chloride 0.9 % injection 3 mL  3 mL Intravenous Q12H Leone Brand, MD   3 mL at  10/05/14 1058  . vancomycin (VANCOCIN) IVPB 750 mg/150 ml premix  750 mg Intravenous Q24H Reginia Naas, RPH   750 mg at 10/05/14 1555    Musculoskeletal: Strength & Muscle Tone: increased Gait & Station: unable to stand Patient leans: N/A  Psychiatric Specialty Exam: Physical Exam as per history and physical   ROS unable to perform due to tumor current mental status   Blood pressure 148/79, pulse 104, temperature 98.3 F (36.8 C), temperature source Oral, resp. rate 18, height $RemoveBe'5\' 4"'WHLPPvJXE$  (1.626 m), weight 64.864 kg (143 lb), SpO2 96 %.Body mass index is 24.53 kg/(m^2).  General Appearance: Bizarre, Disheveled and Guarded  Eye Contact::  Minimal  Speech:  Blocked and Garbled  Volume:  Decreased  Mood:  Anxious, Euphoric and Irritable  Affect:  Non-Congruent, Inappropriate and Labile  Thought Process:  Disorganized  Orientation:  Negative  Thought Content:  Rumination  Suicidal Thoughts:  No  Homicidal Thoughts:  No  Memory:  Immediate;   Fair Recent;   Poor  Judgement:  Impaired  Insight:  Lacking  Psychomotor Activity:  Restlessness  Concentration:  Poor  Recall:  Poor  Fund of Knowledge:Poor  Language: Fair  Akathisia:  Negative  Handed:  Right  AIMS (if indicated):     Assets:  Catering manager Housing Leisure Time Resilience Social Support Transportation  ADL's:  Impaired  Cognition: Impaired,  Moderate  Sleep:      Medical Decision Making: Review of Psycho-Social Stressors (1), Review or order clinical lab tests (1), Established Problem, Worsening (2), Review of Last Therapy Session (1), Review or order medicine tests (1), Review of Medication Regimen & Side Effects (2) and Review of New Medication or Change in Dosage (2)  Treatment Plan Summary: Patient presented with the bipolar mania and altered mental status secondary to sepsis and unable to care for herself. Daily contact with patient to assess and evaluate symptoms and progress in treatment  and Medication management  Plan:  Recommend  psychiatric Inpatient admission when medically cleared. Supportive therapy provided about ongoing stressors. Appreciate psychiatric consultation Please contact [phone] or [phone] if needs further assistance  Disposition: Admit to acute psychiatric hospitalization when medically stable  Jeri Jeanbaptiste,JANARDHAHA R. 10/05/2014 5:14 PM

## 2014-10-05 NOTE — Progress Notes (Signed)
CRITICAL VALUE ALERT  Critical value received:  Chloride >130  Date of notification:  10/05/2014   Time of notification:  6:14 AM   Critical value read back:Yes.    Nurse who received alert:  Monico Hoar, RN  MD notified (1st page):  Tawanna Sat, MD   Time of first page:  6:14 AM   Responding MD:  Tawanna Sat, MD   Time MD responded:  6:15 AM

## 2014-10-05 NOTE — Progress Notes (Signed)
Family Medicine Teaching Service Daily Progress Note Intern Pager: [phone]  Patient name: Kelsey Hanna Medical record number: [id number] Date of birth: 1950/05/20 Age: 64 y.o. Gender: female  Primary Care Provider: Saralyn Pilar, MD Consultants: Psychiatry Code Status: DNR  Pt Overview and Major Events to Date:  8/18: Admitted for AMS, SIRS, severe hypernatremia, mania  Assessment and Plan:  Kelsey Hanna is a 64 y.o. female presenting with altered mental status and fever after being found down in her apartment. PMH is significant for Bipolar Disorder, T2DM, Hypothyroidism, ?afib  # Sepsis with hypotension / Acute toxic metabolic encephalopathy: - BCx grew GPC - additional labs: ethanol, APAP, salicylate level, TSH and free T4, RPR - LP studies neg for meningitis - BCx +GPC, follow UCx - continue Vanc D2, d/c zosyn  # Hypernatremia/Electrolytes: Na 165 on admission, now 160, Cl 130  -s/p  fluids at 1/2NS 150cc/hr, transitioned to D5W 100 ml/hr -Bmet q4  # Altered Mental status and bipolar- Manic state likely secondary to Bipolar manic state+ metabolic encephalopathy+ hypernatremia -Continue home Quetiapine, cymbalta, depakote as able to take PO - Psych consult, appreciate recs - IV ativan PRN agitation - consider soft restraints if agitation remains an issue - continue mild to decrease pulling at lines  # AKI: improving 3.07>>1.46 this AM - continue fluids - trending BMET   # Hypothyroid- hyperthyroid TST .21, T4 .94. Potentially secondary to inappropriate dosing (med list has both 12mcg and 129mcg doses, daughter unable to confirm her actual dose).  - Repeat TSH, free T4 - hold synthroid  DM2- Last A1c 5.9 09/06/2014, stable - SSI - CBG ACHS  Code Status- Per daughter, her mother does not wish to be resuscitated in the event of a code - DNR  FEN/GI: D5W at 100cc/hr, clears Prophylaxis: heparin subq   Disposition: Pending clinical  improvement  Subjective:  Denies pain, states she is short of breath but able to speak in full sentences with normal WOB and satting 100% on RA Continues to be disoriented  Objective: Temp:  [97.8 F (36.6 C)-98.9 F (37.2 C)] 98.3 F (36.8 C) (08/18 1240) Pulse Rate:  [85-104] 104 (08/18 1328) Resp:  [18-29] 18 (08/18 1328) BP: (120-148)/(64-99) 148/79 mmHg (08/18 1240) SpO2:  [85 %-99 %] 96 % (08/18 1328) Weight:  [143 lb (64.864 kg)] 143 lb (64.864 kg) (08/17 2000) Physical Exam: General: Mild agitation, no acute distress.  HEENT: Left eye clouding, right eye Pupil RRL Cardiovascular: RRR no m/r/g Respiratory: CTAB, normal WOB no wheezes, rales or crackles. Exam limited by pt compliance, would not sit up Abdomen: soft, non tender, no organomegally palpated, + BS MSK: good tone Skin: mild bilateral lower extremity mottling, mild bruising on back else no rashes noted Neuro: Disoriented to person, place and time. Moving all extremities equally. Limited by pt compliance Psych: Continues to be agitated but pleasant and cooperative. Tangential, pressured speech. Follows some commands (opening eyes and mouth, nothing else) but does not answer questions appropriately.  Laboratory:  Recent Labs Lab 10/04/14 1018 10/05/14 0405  WBC 17.7* 17.8*  HGB 17.9* 14.5  HCT 53.7* 44.6  PLT 227 140*    Recent Labs Lab 10/04/14 1018  10/04/14 1954 10/04/14 2230 10/05/14 0405  NA 165*  < > 165* 163* 160*  K 4.0  < > 3.4* 3.0* 3.4*  CL 127*  < > 129* 128* >130*  CO2 17*  < > 17* 23 21*  BUN 75*  < > 69* 63* 58*  CREATININE  3.07*  < > 2.03* 1.85* 1.46*  CALCIUM 9.9  < > 8.3* 8.4* 8.4*  PROT 7.8  --   --   --   --   BILITOT 1.7*  --   --   --   --   ALKPHOS 63  --   --   --   --   ALT 54  --   --   --   --   AST 78*  --   --   --   --   GLUCOSE 118*  < > 110* 110* 97  < > = values in this interval not displayed.    Imaging/Diagnostic Tests: CT  chest/abd/pelvis  IMPRESSION: No acute finding chest, abdomen or pelvis.  CT head  IMPRESSION: 1. Stable atrophy and white matter disease. 2. No acute intracranial abnormality. 3. Stable degenerative changes in the cervical spine. 4. No acute fracture or traumatic subluxation.   Veatrice Bourbon, MD 10/05/2014, 4:04 PM PGY-2, [place] Intern pager: [phone], text pages welcome

## 2014-10-05 NOTE — Progress Notes (Signed)
MD made aware that pts potassium level is 5.8 with runs of potassium ordered. Stat BMET ordered per MD.

## 2014-10-05 NOTE — Progress Notes (Addendum)
Entered in error

## 2014-10-05 NOTE — Progress Notes (Signed)
Utilization Review Completed.  

## 2014-10-05 NOTE — Progress Notes (Signed)
MD made aware of positive blood cultures.

## 2014-10-06 DIAGNOSIS — F312 Bipolar disorder, current episode manic severe with psychotic features: Secondary | ICD-10-CM | POA: Diagnosis not present

## 2014-10-06 LAB — BASIC METABOLIC PANEL
ANION GAP: 9 (ref 5–15)
ANION GAP: 11 (ref 5–15)
ANION GAP: 10 (ref 5–15)
ANION GAP: 6 (ref 5–15)
ANION GAP: 7 (ref 5–15)
ANION GAP: 9 (ref 5–15)
BUN: 29 mg/dL — ABNORMAL HIGH (ref 6–20)
BUN: 27 mg/dL — AB (ref 6–20)
BUN: 26 mg/dL — ABNORMAL HIGH (ref 6–20)
BUN: 17 mg/dL (ref 6–20)
BUN: 18 mg/dL (ref 6–20)
BUN: 15 mg/dL (ref 6–20)
CALCIUM: 8.8 mg/dL — AB (ref 8.9–10.3)
CALCIUM: 8.7 mg/dL — AB (ref 8.9–10.3)
CALCIUM: 8.6 mg/dL — AB (ref 8.9–10.3)
CALCIUM: 8.6 mg/dL — AB (ref 8.9–10.3)
CHLORIDE: 118 mmol/L — AB (ref 101–111)
CHLORIDE: 120 mmol/L — AB (ref 101–111)
CO2: 22 mmol/L (ref 22–32)
CO2: 21 mmol/L — ABNORMAL LOW (ref 22–32)
CO2: 20 mmol/L — ABNORMAL LOW (ref 22–32)
CO2: 23 mmol/L (ref 22–32)
CO2: 23 mmol/L (ref 22–32)
CO2: 22 mmol/L (ref 22–32)
Calcium: 8.7 mg/dL — ABNORMAL LOW (ref 8.9–10.3)
Calcium: 9.1 mg/dL (ref 8.9–10.3)
Chloride: 118 mmol/L — ABNORMAL HIGH (ref 101–111)
Chloride: 119 mmol/L — ABNORMAL HIGH (ref 101–111)
Chloride: 118 mmol/L — ABNORMAL HIGH (ref 101–111)
Chloride: 115 mmol/L — ABNORMAL HIGH (ref 101–111)
Creatinine, Ser: 0.83 mg/dL (ref 0.44–1.00)
Creatinine, Ser: 0.77 mg/dL (ref 0.44–1.00)
Creatinine, Ser: 0.92 mg/dL (ref 0.44–1.00)
Creatinine, Ser: 0.63 mg/dL (ref 0.44–1.00)
Creatinine, Ser: 0.64 mg/dL (ref 0.44–1.00)
Creatinine, Ser: 0.72 mg/dL (ref 0.44–1.00)
GFR calc Af Amer: >60 mL/min (ref >60)
GFR calc Af Amer: >60 mL/min (ref >60)
GFR calc non Af Amer: >60 mL/min (ref >60)
GLUCOSE: 130 mg/dL — AB (ref 65–99)
GLUCOSE: 120 mg/dL — AB (ref 65–99)
GLUCOSE: 124 mg/dL — AB (ref 65–99)
GLUCOSE: 108 mg/dL — AB (ref 65–99)
Glucose, Bld: 124 mg/dL — ABNORMAL HIGH (ref 65–99)
Glucose, Bld: 99 mg/dL (ref 65–99)
POTASSIUM: 3.3 mmol/L — AB (ref 3.5–5.1)
POTASSIUM: 3.5 mmol/L (ref 3.5–5.1)
POTASSIUM: 5.1 mmol/L (ref 3.5–5.1)
POTASSIUM: 3.7 mmol/L (ref 3.5–5.1)
Potassium: 3.4 mmol/L — ABNORMAL LOW (ref 3.5–5.1)
Potassium: 3.4 mmol/L — ABNORMAL LOW (ref 3.5–5.1)
SODIUM: 150 mmol/L — AB (ref 135–145)
SODIUM: 150 mmol/L — AB (ref 135–145)
SODIUM: 148 mmol/L — AB (ref 135–145)
SODIUM: 148 mmol/L — AB (ref 135–145)
SODIUM: 146 mmol/L — AB (ref 135–145)
Sodium: 149 mmol/L — ABNORMAL HIGH (ref 135–145)

## 2014-10-06 LAB — CBC
HEMATOCRIT: 36.8 % (ref 36.0–46.0)
HEMOGLOBIN: 12.6 g/dL (ref 12.0–15.0)
MCH: 31 pg (ref 26.0–34.0)
MCHC: 34.2 g/dL (ref 30.0–36.0)
MCV: 90.4 fL (ref 78.0–100.0)
Platelets: 127 10*3/uL — ABNORMAL LOW (ref 150–400)
RBC: 4.07 MIL/uL (ref 3.87–5.11)
RDW: 13.9 % (ref 11.5–15.5)
WBC: 12.8 10*3/uL — ABNORMAL HIGH (ref 4.0–10.5)

## 2014-10-06 LAB — APTT
aPTT: 49 seconds — ABNORMAL HIGH (ref 24–37)
aPTT: 40 seconds — ABNORMAL HIGH (ref 24–37)

## 2014-10-06 LAB — GLUCOSE, CAPILLARY
GLUCOSE-CAPILLARY: 108 mg/dL — AB (ref 65–99)
GLUCOSE-CAPILLARY: 91 mg/dL (ref 65–99)
Glucose-Capillary: 100 mg/dL — ABNORMAL HIGH (ref 65–99)

## 2014-10-06 MED ORDER — DULOXETINE HCL 30 MG PO CPEP
30.0000 mg | ORAL_CAPSULE | Freq: Every day | ORAL | Status: DC
  Administered 2014-10-06 – 2014-10-13 (×8): 30 mg via ORAL
  Filled 2014-10-06 (×8): qty 1

## 2014-10-06 MED ORDER — BOOST / RESOURCE BREEZE PO LIQD
1.0000 | Freq: Three times a day (TID) | ORAL | Status: DC
  Administered 2014-10-06 – 2014-10-11 (×16): 1 via ORAL

## 2014-10-06 MED ORDER — ARGATROBAN 50 MG/50ML IV SOLN
2.7600 ug/kg/min | INTRAVENOUS | Status: DC
  Administered 2014-10-06: 1.56 ug/kg/min via INTRAVENOUS
  Administered 2014-10-06: 1 ug/kg/min via INTRAVENOUS
  Administered 2014-10-07: 1.56 ug/kg/min via INTRAVENOUS
  Administered 2014-10-07: 2.3 ug/kg/min via INTRAVENOUS
  Administered 2014-10-08 (×2): 2.76 ug/kg/min via INTRAVENOUS
  Filled 2014-10-06 (×9): qty 50

## 2014-10-06 MED ORDER — FREE WATER
300.0000 mL | Freq: Three times a day (TID) | Status: DC
  Administered 2014-10-06 – 2014-10-07 (×4): 300 mL via ORAL

## 2014-10-06 NOTE — Progress Notes (Signed)
Family Medicine Teaching Service Daily Progress Note Intern Pager: [phone]  Patient name: Kelsey Hanna Medical record number: [id number] Date of birth: 1950-11-16 Age: 64 y.o. Gender: female  Primary Care Provider: Saralyn Pilar, MD Consultants: Psychiatry Code Status: DNR  Pt Overview and Major Events to Date:  8/18: Admitted for AMS, SIRS, severe hypernatremia, mania  Assessment and Plan:  Kelsey Hanna is a 64 y.o. female presenting with altered mental status and fever after being found down in her apartment. PMH is significant for Bipolar Disorder, T2DM, Hypothyroidism, ?afib  # Sepsis with hypotension / Acute toxic metabolic encephalopathy: - Improving sepsis picture - additional labs: ethanol, APAP, salicylate level, RPR  all negative  - LP studies neg for meningitis - 8/17 BCx +GPC in clusters, await further speciation and tailor abx accordingly - U cx insignificant growth 3,000 cfu - continue Vanc D3, s/p zosyn - repeat BCx today, to continue IV abx for 14 days after neg Cx if staph  # Hypernatremia/Electrolytes: Na 165 on admission, now 150, Cl improving now 118 from 130, after switching fluids to D5W -s/p  fluids at 1/2NS 150cc/hr, transitioned to D5W 100 ml/hr - increase D5W to 125 ml/hr to hasten resolution of hypernatremia as would have expected hypernatremia to have resolved by this time.  -Bmet q4  # Altered Mental status and bipolar- Manic state likely secondary to Bipolar manic state+ metabolic encephalopathy+ hypernatremia- mental state  -Continue home Quetiapine, cymbalta, depakote as able to take PO - Psych consult, -->> will need inpatient psych after medically cleared - IV ativan PRN agitation - consider soft restraints if agitation remains an issue - continue mild to decrease pulling at lines  Heme: Plts down trended from 227>> 140>> 127 8/19 Concerning for consumptive vs destructive process, less likely to be secondary to DIC as septic  picture improving or TTP. Has recently been on heparin ppx in 04/2014 when she had a similar drop in plts. Overall picture concerning for HIT - Will consider d/c heparin, HIT panel - consider IV argatroban vs subq fondaparinux  # AKI: resolved Cr .77  - continue fluids as above - trending BMET   # Hypothyroid- hyperthyroid TST .21, T4 .94. Potentially secondary to inappropriate dosing (med list has both 189mcg and 13mcg doses, daughter unable to confirm her actual dose).  - Repeat TSH, free T4 again as outpatient - hold synthroid at this time  DM2- Last A1c 5.9 09/06/2014, stable - SSI - CBG ACHS  Code Status- Per daughter, her mother does not wish to be resuscitated in the event of a code - DNR  FEN/GI: D5W at 125cc/hr, clears Prophylaxis: heparin subq   Disposition: Pending clinical improvement  Subjective:  Denies pain, states she is here because she fell in love and her heart was broken Denies pain, but when asked if she had chest pain she said yes because her food was chopped  Objective: Temp:  [97.5 F (36.4 C)-98.3 F (36.8 C)] 97.5 F (36.4 C) (08/19 0736) Pulse Rate:  [64-104] 64 (08/19 0736) Resp:  [18-29] 18 (08/19 0736) BP: (137-148)/(68-89) 144/89 mmHg (08/19 0736) SpO2:  [85 %-100 %] 100 % (08/19 0736) Physical Exam: General: Mild agitation, no acute distress.  HEENT: Left eye clouding, right eye Pupil RRL Cardiovascular: RRR no m/r/g Respiratory: CTAB, normal WOB no wheezes, rales or crackles. Exam again  limited by pt compliance, would not sit up Abdomen: soft, non tender, no organomegally palpated, + BS MSK: good tone Skin: mild bilateral lower  extremity mottling, mild bruising on back else no rashes noted Neuro: oriented to person, and place, reports it is 1982.  Moving all extremities equally. Limited by pt compliance Psych: Continues to be agitated but pleasant and cooperative. Tangential speech. Follows some commands.  Laboratory:  Recent  Labs Lab 10/04/14 1018 10/05/14 0405  WBC 17.7* 17.8*  HGB 17.9* 14.5  HCT 53.7* 44.6  PLT 227 140*    Recent Labs Lab 10/04/14 1018  10/05/14 2200 10/06/14 0112 10/06/14 0512  NA 165*  < > 153* 149* 150*  K 4.0  < > 4.5 3.3* 3.5  CL 127*  < > 124* 118* 118*  CO2 17*  < > 18* 22 21*  BUN 75*  < > 38* 29* 27*  CREATININE 3.07*  < > 0.87 0.83 0.77  CALCIUM 9.9  < > 8.8* 8.8* 8.7*  PROT 7.8  --   --   --   --   BILITOT 1.7*  --   --   --   --   ALKPHOS 63  --   --   --   --   ALT 54  --   --   --   --   AST 78*  --   --   --   --   GLUCOSE 118*  < > 105* 130* 120*  < > = values in this interval not displayed.    Imaging/Diagnostic Tests: CT chest/abd/pelvis  IMPRESSION: No acute finding chest, abdomen or pelvis.  CT head  IMPRESSION: 1. Stable atrophy and white matter disease. 2. No acute intracranial abnormality. 3. Stable degenerative changes in the cervical spine. 4. No acute fracture or traumatic subluxation.   Veatrice Bourbon, MD 10/06/2014, 8:14 AM PGY-2, [place] Intern pager: [phone], text pages welcome

## 2014-10-06 NOTE — Care Management Note (Signed)
Case Management Note  Patient Details  Name: Kelsey Hanna MRN: [id number] Date of Birth: 1951-01-19  Subjective/Objective:    Pt currently living @ [facility].  Per eval by psychiatrist, will need to transfer to behavioral health when medically stable.  CSW following.                              [facility]  [facility]  In-House Referral:  Clinical Social Work  Discharge planning Services     Post Acute Care Choice:    Choice offered to:     DME Arranged:    DME Agency:     HH Arranged:    [place] Agency:     Status of Service:  Completed, signed off  Medicare Important Message Given:    Date Medicare IM Given:    Medicare IM give by:    Date Additional Medicare IM Given:    Additional Medicare Important Message give by:     If discussed at [place] of Stay Meetings, dates discussed:    Additional Comments:  Girard Cooter, RN 10/06/2014, 1:39 PM

## 2014-10-06 NOTE — Progress Notes (Addendum)
ANTICOAGULATION CONSULT NOTE - Initial Consult  Pharmacy Consult for  Argatroban Indication: r/o HIT  Allergies  Allergen Reactions  . Haldol [Haloperidol] Other (See Comments)    Shaking of the hands, grinding her teeth  . Lithium Other (See Comments)    "makes me feel spaced out" slurred speech   Patient Measurements: Height: 5\' 4"  (162.6 cm) Weight: 143 lb (64.864 kg) IBW/kg (Calculated) : 54.7 Vital Signs: Temp: 97.5 F (36.4 C) (08/19 1703) Temp Source: Axillary (08/19 1703) BP: 147/80 mmHg (08/19 1703) Pulse Rate: 72 (08/19 1703) Labs:  Recent Labs  10/04/14 1018  10/05/14 0405  10/06/14 0512 10/06/14 0845 10/06/14 1330 10/06/14 1700  HGB 17.9*  --  14.5  --  12.6  --   --   --   HCT 53.7*  --  44.6  --  36.8  --   --   --   PLT 227  --  140*  --  127*  --   --   --   APTT  --   --   --   --   --   --   --  49*  CREATININE 3.07*  < > 1.46*  < > 0.77 0.92 0.63  --   CKTOTAL 674*  --   --   --   --   --   --   --   < > = values in this interval not displayed.  Estimated Creatinine Clearance: 60.5 mL/min (by C-G formula based on Cr of 0.63). Medical History: Past Medical History  Diagnosis Date  . Bipolar 1 disorder   . Hypothyroid   . Dyslipidemia   . Retinal detachment   . Sleep apnea   . Diabetes mellitus     dx within last yr....just takes pills  . Cancer     cancer of uterus...no chemo or radiation   Medications:  Infusions:  . argatroban 1 mcg/kg/min (10/06/14 1400)  . dextrose 100 mL/hr at 10/06/14 1500   Assessment: 64yo female with multiple medical problems, admitted with AMS, SIRS, hypernatremia and mania.  She has had a decline in platelets 227 on admit (127) today ~ 44%.  Her H/H has dropped as well with H/H of 17.9 >> 12.6 today.  There is no overt bleeding noted.  APTT very slightly low at 49 this afternoon - will adjust up by 20% to 1.84mcg/kg/h and repeat aPTT in 2h per adjustment nomogram. No bleed/IV line issues per RN  HIT antibody  still in process.  Goal of Therapy:  Target aPTT range of 50-90 seconds (1.5-3 x control). Monitor platelets by anticoagulation protocol: Yes   Plan:  Adjust Argatroban up by ~20% to 1.72mcg/kg/min using total body weight (4.12ml/hr) Check aPTT every 2 hours until two therapeutic levels are obtained. CBC and aPTT at least once daily (Consider q12h aPTT for patients multiple organ failure) Follow up HIT assays and discontinue therapy if HIT ab neg. Order SRA if HIT antibody positive.  Elicia Lamp, PharmD Clinical Pharmacist Pager [phone] 10/06/2014 5:19 PM

## 2014-10-06 NOTE — Progress Notes (Signed)
ANTICOAGULATION CONSULT NOTE -  Follow Up Consult  Pharmacy Consult for  Argatroban Indication: r/o HIT  Allergies  Allergen Reactions  . Haldol [Haloperidol] Other (See Comments)    Shaking of the hands, grinding her teeth  . Lithium Other (See Comments)    "makes me feel spaced out" slurred speech   Patient Measurements: Height: 5\' 4"  (162.6 cm) Weight: 143 lb (64.864 kg) IBW/kg (Calculated) : 54.7 Vital Signs: Temp: 97.9 F (36.6 C) (08/19 2043) Temp Source: Oral (08/19 2043) BP: 120/99 mmHg (08/19 2043) Pulse Rate: 84 (08/19 2043) Labs:  Recent Labs  10/04/14 1018  10/05/14 0405  10/06/14 0512 10/06/14 0845 10/06/14 1330 10/06/14 1700 10/06/14 2104  HGB 17.9*  --  14.5  --  12.6  --   --   --   --   HCT 53.7*  --  44.6  --  36.8  --   --   --   --   PLT 227  --  140*  --  127*  --   --   --   --   APTT  --   --   --   --   --   --   --  49* 40*  CREATININE 3.07*  < > 1.46*  < > 0.77 0.92 0.63 0.64  --   CKTOTAL 674*  --   --   --   --   --   --   --   --   < > = values in this interval not displayed.  Estimated Creatinine Clearance: 60.5 mL/min (by C-G formula based on Cr of 0.64). Medical History: Past Medical History  Diagnosis Date  . Bipolar 1 disorder   . Hypothyroid   . Dyslipidemia   . Retinal detachment   . Sleep apnea   . Diabetes mellitus     dx within last yr....just takes pills  . Cancer     cancer of uterus...no chemo or radiation   Medications:  Infusions:  . argatroban 1.2 mcg/kg/min (10/06/14 1757)  . dextrose 100 mL/hr at 10/06/14 2152   Assessment: 64yo female with multiple medical problems, admitted with AMS, SIRS, hypernatremia and mania.  She has had a decline in platelets 227 on admit (127) today ~ 44%.  Her H/H has dropped as well with H/H of 17.9 >> 12.6 today.  There is no overt bleeding noted.  APTT  Fell 49> 40 sec on increase drip rate 1.2 mcg/kg/h > will adjust rate  up by 30%  and repeat aPTT in 2h per adjustment  nomogram. No bleed/IV line issues per RN  HIT antibody still in process.  Goal of Therapy:  Target aPTT range of 50-90 seconds (1.5-3 x control). Monitor platelets by anticoagulation protocol: Yes   Plan:  Increase  Argatroban 1.56 mcg/kg/min using total body weight  Check aPTT every 2 hours until two therapeutic levels are obtained. CBC and aPTT at least once daily (Consider q12h aPTT for patients multiple organ failure) Follow up HIT assays and discontinue therapy if HIT ab neg. Order SRA if HIT antibody positive.  Bonnita Nasuti Pharm.D. CPP, BCPS Clinical Pharmacist [phone] 10/06/2014 10:02 PM

## 2014-10-06 NOTE — Progress Notes (Signed)
ANTICOAGULATION CONSULT NOTE - Initial Consult  Pharmacy Consult for  Argatroban Indication: r/o HIT  Allergies  Allergen Reactions  . Haldol [Haloperidol] Other (See Comments)    Shaking of the hands, grinding her teeth  . Lithium Other (See Comments)    "makes me feel spaced out" slurred speech   Patient Measurements: Height: 5\' 4"  (162.6 cm) Weight: 143 lb (64.864 kg) IBW/kg (Calculated) : 54.7 Vital Signs: Temp: 97.3 F (36.3 C) (08/19 1250) Temp Source: Axillary (08/19 1250) BP: 134/79 mmHg (08/19 1250) Pulse Rate: 75 (08/19 1250) Labs:  Recent Labs  10/04/14 1018  10/05/14 0405  10/06/14 0112 10/06/14 0512 10/06/14 0845  HGB 17.9*  --  14.5  --   --  12.6  --   HCT 53.7*  --  44.6  --   --  36.8  --   PLT 227  --  140*  --   --  127*  --   CREATININE 3.07*  < > 1.46*  < > 0.83 0.77 0.92  CKTOTAL 674*  --   --   --   --   --   --   < > = values in this interval not displayed.  Estimated Creatinine Clearance: 52.6 mL/min (by C-G formula based on Cr of 0.92). Medical History: Past Medical History  Diagnosis Date  . Bipolar 1 disorder   . Hypothyroid   . Dyslipidemia   . Retinal detachment   . Sleep apnea   . Diabetes mellitus     dx within last yr....just takes pills  . Cancer     cancer of uterus...no chemo or radiation   Medications:  Infusions:  . argatroban    . dextrose 100 mL/hr at 10/06/14 5462  Assessment: 64yo female with multiple medical problems, admitted with AMS, SIRS, hypernatremia and mania.  She has had a decline in platelets 227 on admit (127) today ~ 44%.  Her H/H has dropped as well with H/H of 17.9 >> 12.6 today.  There is no overt bleeding noted.  Goal of Therapy:  Target aPTT range of 50-90 seconds (1.5-3 x control). Monitor platelets by anticoagulation protocol: Yes   Plan:  Begin Argatroban at 27mcg/kg/min without a bolus using total body weight (3.24ml/hr) Check aPTT 2 hours after start of argatroban therapy and adjust per  aPTT response. Check aPTT every 2 hours until two therapeutic levels are obtained. CBC and aPTT at least once daily (Consider q12h aPTT for patients multiple organ failure) Follow up HIT assays and discontinue therapy if HIT ab neg. Order SRA if HIT antibody positive.  Rober Minion, PharmD., MS Clinical Pharmacist Pager:  [phone] Thank you for allowing pharmacy to be part of this patients care team. 10/06/2014,1:13 PM

## 2014-10-06 NOTE — Progress Notes (Signed)
Initial Nutrition Assessment  DOCUMENTATION CODES:   Obesity unspecified  INTERVENTION:   Boost Breeze po TID, each supplement provides 250 kcal and 9 grams of protein  NUTRITION DIAGNOSIS:   Inadequate oral intake related to lethargy/confusion as evidenced by meal completion < 25%  GOAL:   Patient will meet greater than or equal to 90% of their needs  MONITOR:   PO intake, Supplement acceptance, Labs, Weight trends  REASON FOR ASSESSMENT:   Malnutrition Screening Tool  ASSESSMENT:   64 y.o. Female presenting with altered mental status and fever after being found down in her apartment. PMH is significant for Bipolar Disorder, T2DM, Hypothyroidism, ?afib  RD unable to obtain nutrition hx of complete Nutrition Focused Physical Exam.  Pt with AMS and in manic state of bipolar disorder.  + mittens on.  No % PO intake available per flowsheet records.  Would benefit from addition of oral nutrition supplements.  RD to order.  Diet Order:  Diet clear liquid Room service appropriate?: Yes; Fluid consistency:: Thin  Skin:  Reviewed, no issues  Last BM:  8/19  Height:   Ht Readings from Last 1 Encounters:  10/04/14 5\' 4"  (1.626 m)    Weight:   Wt Readings from Last 1 Encounters:  10/04/14 143 lb (64.864 kg)    Ideal Body Weight:  54.5 kg  BMI:  Body mass index is 24.53 kg/(m^2).  Estimated Nutritional Needs:   Kcal:  1600-1800  Protein:  80-90 gm  Fluid:  1.6-1.8 L  EDUCATION NEEDS:   No education needs identified at this time  Arthur Holms, RD, LDN Pager #: [phone] After-Hours Pager #: [phone]

## 2014-10-06 NOTE — Progress Notes (Signed)
CSW spoke with pt daughter- they want CSW involvement to ensure sufficient follow up at time of DC.  Pt works with ACT team- Herbie [place] [phone]- CSW will follow up to discuss pt  CSW will continue to follow.  Domenica Reamer, [place] Social Worker [phone]

## 2014-10-07 DIAGNOSIS — F29 Unspecified psychosis not due to a substance or known physiological condition: Secondary | ICD-10-CM | POA: Insufficient documentation

## 2014-10-07 LAB — BASIC METABOLIC PANEL
ANION GAP: 7 (ref 5–15)
ANION GAP: 8 (ref 5–15)
Anion gap: 7 (ref 5–15)
BUN: 13 mg/dL (ref 6–20)
BUN: 12 mg/dL (ref 6–20)
BUN: 13 mg/dL (ref 6–20)
CHLORIDE: 115 mmol/L — AB (ref 101–111)
CHLORIDE: 114 mmol/L — AB (ref 101–111)
CHLORIDE: 114 mmol/L — AB (ref 101–111)
CO2: 23 mmol/L (ref 22–32)
CO2: 24 mmol/L (ref 22–32)
CO2: 23 mmol/L (ref 22–32)
CREATININE: 0.66 mg/dL (ref 0.44–1.00)
Calcium: 8.3 mg/dL — ABNORMAL LOW (ref 8.9–10.3)
Calcium: 8.5 mg/dL — ABNORMAL LOW (ref 8.9–10.3)
Calcium: 8.7 mg/dL — ABNORMAL LOW (ref 8.9–10.3)
Creatinine, Ser: 0.64 mg/dL (ref 0.44–1.00)
Creatinine, Ser: 0.71 mg/dL (ref 0.44–1.00)
GFR calc non Af Amer: >60 mL/min (ref >60)
GFR calc non Af Amer: >60 mL/min (ref >60)
Glucose, Bld: 114 mg/dL — ABNORMAL HIGH (ref 65–99)
Glucose, Bld: 89 mg/dL (ref 65–99)
Glucose, Bld: 98 mg/dL (ref 65–99)
POTASSIUM: 3.5 mmol/L (ref 3.5–5.1)
POTASSIUM: 3.3 mmol/L — AB (ref 3.5–5.1)
POTASSIUM: 3.6 mmol/L (ref 3.5–5.1)
SODIUM: 145 mmol/L (ref 135–145)
SODIUM: 145 mmol/L (ref 135–145)
SODIUM: 145 mmol/L (ref 135–145)

## 2014-10-07 LAB — CSF CULTURE
CULTURE: NO GROWTH
SPECIAL REQUESTS: NORMAL

## 2014-10-07 LAB — GLUCOSE, CAPILLARY
GLUCOSE-CAPILLARY: 121 mg/dL — AB (ref 65–99)
GLUCOSE-CAPILLARY: 78 mg/dL (ref 65–99)
Glucose-Capillary: 129 mg/dL — ABNORMAL HIGH (ref 65–99)

## 2014-10-07 LAB — CULTURE, BLOOD (ROUTINE X 2)

## 2014-10-07 LAB — CBC
HEMATOCRIT: 34.5 % — AB (ref 36.0–46.0)
HEMOGLOBIN: 11.6 g/dL — AB (ref 12.0–15.0)
MCH: 30.1 pg (ref 26.0–34.0)
MCHC: 33.6 g/dL (ref 30.0–36.0)
MCV: 89.4 fL (ref 78.0–100.0)
Platelets: 142 10*3/uL — ABNORMAL LOW (ref 150–400)
RBC: 3.86 MIL/uL — ABNORMAL LOW (ref 3.87–5.11)
RDW: 13.6 % (ref 11.5–15.5)
WBC: 7.1 10*3/uL (ref 4.0–10.5)

## 2014-10-07 LAB — HEPARIN INDUCED PLATELET AB (HIT ANTIBODY): HEPARIN INDUCED PLT AB: 0.168 {OD_unit} (ref 0.000–0.400)

## 2014-10-07 LAB — APTT
APTT: 56 s — AB (ref 24–37)
APTT: 48 s — AB (ref 24–37)
APTT: 49 s — AB (ref 24–37)
aPTT: 43 seconds — ABNORMAL HIGH (ref 24–37)

## 2014-10-07 LAB — CSF CULTURE W GRAM STAIN

## 2014-10-07 MED ORDER — NAPROXEN SODIUM 275 MG PO TABS: 275.0000 mg | ORAL_TABLET | Freq: Once | ORAL | Status: DC

## 2014-10-07 MED ORDER — VANCOMYCIN HCL IN DEXTROSE 750-5 MG/150ML-% IV SOLN
750.0000 mg | Freq: Two times a day (BID) | INTRAVENOUS | Status: AC
  Administered 2014-10-07 – 2014-10-10 (×7): 750 mg via INTRAVENOUS
  Filled 2014-10-07 (×7): qty 150

## 2014-10-07 MED ORDER — NAPROXEN 250 MG PO TABS
250.0000 mg | ORAL_TABLET | Freq: Once | ORAL | Status: AC
  Administered 2014-10-07: 250 mg via ORAL
  Filled 2014-10-07: qty 1

## 2014-10-07 MED ORDER — POTASSIUM CHLORIDE CRYS ER 20 MEQ PO TBCR
40.0000 meq | EXTENDED_RELEASE_TABLET | Freq: Once | ORAL | Status: AC
  Administered 2014-10-07: 40 meq via ORAL
  Filled 2014-10-07: qty 2

## 2014-10-07 MED ORDER — NAPROXEN 250 MG PO TABS: 250.0000 mg | ORAL_TABLET | Freq: Once | ORAL | Status: DC

## 2014-10-07 MED ORDER — SODIUM CHLORIDE 0.9 % IV SOLN
INTRAVENOUS | Status: DC
  Administered 2014-10-07 (×2): via INTRAVENOUS

## 2014-10-07 NOTE — Progress Notes (Signed)
Dear Doctor: Kelsey Hanna This patient has been identified as a candidate for PICC for the following reason (s): IV therapy over 48 hours and drug pH or osmolality (causing phlebitis, infiltration in 24 hours) If you agree, please write an order for the indicated device. For any questions contact the Vascular Access Team at [phone] if no answer, please leave a message.  Patient has very poor veins requiring ultrasound.  Both arms very bruised.  PICC may benefit her.   Thank you for supporting the early vascular access assessment program.

## 2014-10-07 NOTE — Progress Notes (Signed)
Family Medicine Teaching Service Daily Progress Note Intern Pager: [phone]  Patient name: Kelsey Hanna Medical record number: [id number] Date of birth: 05/01/1950 Age: 64 y.o. Gender: female  Primary Care Provider: Saralyn Pilar, MD Consultants: Psychiatry Code Status: DNR  Pt Overview and Major Events to Date:  8/18: Admitted for AMS, SIRS, severe hypernatremia, mania  Assessment and Plan: Kelsey Hanna is a 64 y.o. female presenting with altered mental status and fever after being found down in her apartment. PMH is significant for Bipolar Disorder, T2DM, Hypothyroidism, ?afib  # Sepsis with hypotension / Acute toxic metabolic encephalopathy: - Improving sepsis picture:  ethanol, APAP, salicylate level, RPR  all negative . LP studies neg for meningitis. S/P zosyn - 8/17 Coag negative staph, waiting for sensitivities, BCx 8/19 NGTD  - Ucx insignificant growth 3,000 cfu - Continue Vanc - Continue IV abx for 14 days after neg Cx  # Hypernatremia/Electrolytes: Resolved. Na 165 on admission, now 145, s/p  fluids at 1/2NS 150cc/hr, D5W 100 ml/hr, followed D5W 125 ml/hr. D/Ced with resolution  - Continue 300 ml free water TID   - Continue maintenance at 105 ml/hr  - Bmet daily   # Altered Mental status and bipolar- Manic state likely secondary to Bipolar manic state+ metabolic encephalopathy+ hypernatremia- mental state  -Continue home Quetiapine, cymbalta, depakote as able to take PO - Psych consult, -->> will need inpatient psych after medically cleared - Consider Haldol, if patient becomes agigtated - consider soft restraints if agitation remains an issue  Heme: Trending now initially, however now trending back up 227>> 140>> 127 > 142,  Has recently been on heparin ppx in 04/2014 when she had a similar drop in plts. Overall picture concerning for HIT - HIT panel pending?  - continue IV argatroban   # AKI: resolved Cr 0.66 - Continue to trending BMET   #  Hypothyroid, currently hyperthyroid. TST .21, T4 .94. Potentially secondary to inappropriate dosing (med list has both 164mcg and 14mcg doses, daughter unable to confirm her actual dose).  - Repeat TSH, free T4 again as outpatient - Holding synthroid   DM2- Last A1c 5.9 09/06/2014, stable - SSI - CBG ACHS  Code Status- Per daughter, her mother does not wish to be resuscitated in the event of a code - DNR  FEN/GI:  clears Prophylaxis: heparin subq  Disposition: Pending clinical improvement, dispo to inpatient psych   Subjective:   Patient states that she feels ready for medical discharge. However, patient states that her problems stem from her daughter is not allowing her to see her grandchildren. She states they living next door to each other, and she can her daughter get up in the night to finish off bottle of wine. She states that her daughter has drinking problems and that is why she is at the hospital.   Objective: Temp:  [97.2 F (36.2 C)-97.9 F (36.6 C)] 97.2 F (36.2 C) (08/20 0318) Pulse Rate:  [64-85] 81 (08/20 0318) Resp:  [17-27] 27 (08/20 0318) BP: (108-147)/(29-108) 108/74 mmHg (08/20 0318) SpO2:  [97 %-100 %] 97 % (08/20 0318) Physical Exam: General: Mild agitation, no acute distress.  HEENT: Left eye clouding, right eye Pupil RRL Cardiovascular: RRR no m/r/g Respiratory: CTAB, normal WOB no wheezes, rales or crackles.  Abdomen: soft, non tender, no organomegally palpated, + BS Skin: mild bruising on back, no rashes noted Neuro: oriented to person and place. Moving extremities  Psych: Agitated but pleasant and cooperative. Follows some commands.  Laboratory:  Recent Labs Lab 10/05/14 0405 10/06/14 0512 10/07/14 0512  WBC 17.8* 12.8* 7.1  HGB 14.5 12.6 11.6*  HCT 44.6 36.8 34.5*  PLT 140* 127* 142*    Recent Labs Lab 10/04/14 1018  10/06/14 2104 10/07/14 0116 10/07/14 0512  NA 165*  < > 146* 145 145  K 4.0  < > 3.4* 3.5 3.3*  CL 127*  < >  115* 115* 114*  CO2 17*  < > 22 23 24   BUN 75*  < > 15 13 12   CREATININE 3.07*  < > 0.72 0.66 0.64  CALCIUM 9.9  < > 8.6* 8.3* 8.5*  PROT 7.8  --   --   --   --   BILITOT 1.7*  --   --   --   --   ALKPHOS 63  --   --   --   --   ALT 54  --   --   --   --   AST 78*  --   --   --   --   GLUCOSE 118*  < > 108* 114* 89  < > = values in this interval not displayed.   Imaging/Diagnostic Tests: CT chest/abd/pelvis  IMPRESSION: No acute finding chest, abdomen or pelvis.  CT head  IMPRESSION: 1. Stable atrophy and white matter disease. 2. No acute intracranial abnormality. 3. Stable degenerative changes in the cervical spine. 4. No acute fracture or traumatic subluxation.   Kelsey Hanna Cletis Media, MD 10/07/2014, 7:05 AM PGY-2, [facility] Intern pager: [phone], text pages welcome

## 2014-10-07 NOTE — Consult Note (Signed)
WOC wound consult note Reason for Consult: Lower back tissue loss. Wound type:Moisture associated skin damage, Friction and shear injury plus pressure. Stage 2. Pressure Ulcer POA: Yes/No Measurement:6cm x 8cm affected area over coccyx with scattered open areas of less than 0.2cm depth. Presentation is consistent with friction plus moisture, but there is also noted to be a pressure/shear component. Wound HWE:XHBZ, moist Drainage (amount, consistency, odor) small amount of serous exudate on old dressing Periwound:Intact, clear, dry Dressing procedure/placement/frequency: At this time, patient is in a manic state and cooperative, but with pressured speech.  She stands fr my assessment with the assist of her bedside RN and NT. There is a large 5cm round ecchymotic area in the lower back with no loss of skin integrity.  There is a large erythematous area with skin loss in the center in the coccyx region.  I will suggest covering this with a soft silicone foam dressing and having her turn side to side while in bed, avoiding the supine position.  In addition, I have provided a pressure redistribution chair pad for her use when OOB in the chair. Thank you for this consult. The [facility] nursing team will not follow, but will remain available to this patient, the nursing and medical teams.  Please re-consult if needed. Thanks, Maudie Flakes, MSN, RN, [place], [place], [place] [phone])

## 2014-10-07 NOTE — Progress Notes (Signed)
[place] for Argatroban Indication: r/o HIT  Allergies  Allergen Reactions  . Haldol [Haloperidol] Other (See Comments)    Shaking of the hands, grinding her teeth  . Lithium Other (See Comments)    "makes me feel spaced out" slurred speech   Patient Measurements: Height: 5\' 4"  (162.6 cm) Weight: 143 lb (64.864 kg) IBW/kg (Calculated) : 54.7 Vital Signs: Temp: 97.2 F (36.2 C) (08/20 0318) Temp Source: Axillary (08/20 0318) BP: 108/74 mmHg (08/20 0318) Pulse Rate: 81 (08/20 0318) Labs:  Recent Labs  10/04/14 1018  10/05/14 0405  10/06/14 0512  10/06/14 1700 10/06/14 2104 10/07/14 0116  HGB 17.9*  --  14.5  --  12.6  --   --   --   --   HCT 53.7*  --  44.6  --  36.8  --   --   --   --   PLT 227  --  140*  --  127*  --   --   --   --   APTT  --   --   --   --   --   --  49* 40* 56*  CREATININE 3.07*  < > 1.46*  < > 0.77  < > 0.64 0.72 0.66  CKTOTAL 674*  --   --   --   --   --   --   --   --   < > = values in this interval not displayed.  Estimated Creatinine Clearance: 60.5 mL/min (by C-G formula based on Cr of 0.66).  Assessment: 64 yo female with sepsis,thrombocytopenia, possible HIT, for argatroban  Goal of Therapy:  Target aPTT range of 50-90 seconds Monitor platelets by anticoagulation protocol: Yes   Plan:  Continue argatroban at current rate for now Recheck PTT with AM Labs  Phillis Knack, PharmD, BCPS  10/07/2014 3:26 AM

## 2014-10-07 NOTE — Progress Notes (Addendum)
[place] for Argatroban Indication: r/o HIT  Allergies  Allergen Reactions  . Haldol [Haloperidol] Other (See Comments)    Shaking of the hands, grinding her teeth  . Lithium Other (See Comments)    "makes me feel spaced out" slurred speech   Patient Measurements: Height: 5\' 4"  (162.6 cm) Weight: 143 lb (64.864 kg) IBW/kg (Calculated) : 54.7 Vital Signs: Temp: 97.5 F (36.4 C) (08/20 0821) Temp Source: Oral (08/20 0821) BP: 106/71 mmHg (08/20 0821) Pulse Rate: 80 (08/20 0821) Labs:  Recent Labs  10/04/14 1018  10/05/14 0405  10/06/14 0512  10/06/14 2104 10/07/14 0116 10/07/14 0512  HGB 17.9*  --  14.5  --  12.6  --   --   --  11.6*  HCT 53.7*  --  44.6  --  36.8  --   --   --  34.5*  PLT 227  --  140*  --  127*  --   --   --  142*  APTT  --   --   --   --   --   < > 40* 56* 48*  CREATININE 3.07*  < > 1.46*  < > 0.77  < > 0.72 0.66 0.64  CKTOTAL 674*  --   --   --   --   --   --   --   --   < > = values in this interval not displayed.  Estimated Creatinine Clearance: 60.5 mL/min (by C-G formula based on Cr of 0.64).  Assessment: 64 yo female with sepsis,thrombocytopenia, possible HIT - labs still pending.  Argatroban has been started and was therapeutic early this morning but again has dropped below desired goal range.  Her H/H has trended down a bit further as well but no noted bleeding complications, her platelets have trended back upward.    Goal of Therapy:  Target aPTT range of 50-90 seconds Monitor platelets by anticoagulation protocol: Yes   Plan:  - Increase Argatroban by ~ 20% to 1.94mcg/kg (rate = 7.4 ml/hr) - Recheck 4 hr PTT  - Monitor for s/s of bleeding.  - F/U HIT panel and order SRA if positive  Rober Minion, PharmD., MS Clinical Pharmacist Pager:  [phone] Thank you for allowing pharmacy to be part of this patients care team. 10/07/2014 9:41 AM   Addendum: Level draw a little early but still below  desired goal after rate change at 49 sec.  No noted bleeding with therapy.  Plan: Will increase rate slightly more to 2.3 mcg/kg (rate = 74ml/hr) Recheck aPTT in 4 hours and adjust accordingly.  Rober Minion, PharmD., MS Clinical Pharmacist Pager:  [phone]

## 2014-10-07 NOTE — Progress Notes (Signed)
[place] for Argatroban Indication: r/o HIT  Allergies  Allergen Reactions  . Haldol [Haloperidol] Other (See Comments)    Shaking of the hands, grinding her teeth  . Lithium Other (See Comments)    "makes me feel spaced out" slurred speech   Patient Measurements: Height: 5\' 4"  (162.6 cm) Weight: 143 lb (64.864 kg) IBW/kg (Calculated) : 54.7 Vital Signs: Temp: 98.1 F (36.7 C) (08/20 1934) Temp Source: Axillary (08/20 1934) BP: 116/60 mmHg (08/20 1735) Pulse Rate: 87 (08/20 1934) Labs:  Recent Labs  10/05/14 0405  10/06/14 0512  10/07/14 0116 10/07/14 0512 10/07/14 0930 10/07/14 1315 10/07/14 1911  HGB 14.5  --  12.6  --   --  11.6*  --   --   --   HCT 44.6  --  36.8  --   --  34.5*  --   --   --   PLT 140*  --  127*  --   --  142*  --   --   --   APTT  --   --   --   < > 56* 48*  --  49* 43*  CREATININE 1.46*  < > 0.77  < > 0.66 0.64 0.71  --   --   < > = values in this interval not displayed.  Estimated Creatinine Clearance: 60.5 mL/min (by C-G formula based on Cr of 0.71).  Assessment: 64 yo female with sepsis,thrombocytopenia, possible HIT - labs still pending.  Argatroban has been started and was therapeutic early this morning but again has dropped below desired goal range.  Her H/H has trended down a bit further as well but no noted bleeding complications, her platelets have trended back upward.    PTT still low at 43 seconds  Goal of Therapy:  Target aPTT range of 50-90 seconds Monitor platelets by anticoagulation protocol: Yes   Plan:  - Increase Argatroban by ~ 20% to 2.76 mcg/kg (rate = 10.7 ml/hr) - Recheck 4 hr PTT  - Monitor for s/s of bleeding.  - F/U HIT panel and order SRA if positive   Thank you Anette Guarneri, PharmD [phone] 10/07/2014 8:03 PM   A

## 2014-10-07 NOTE — Progress Notes (Signed)
Called ID to discuss patient's blood cultures. Patient was positive for coagulase negative staph in one of the bottles. Dr. Megan Salon indicated that this was unlikely to be the source of patient's initial SIRS picture on admission. He indicated treating for a short course of antibiotics, i.e 7 days of vancomycin.

## 2014-10-08 LAB — BASIC METABOLIC PANEL
ANION GAP: 8 (ref 5–15)
BUN: 11 mg/dL (ref 6–20)
CHLORIDE: 116 mmol/L — AB (ref 101–111)
CO2: 22 mmol/L (ref 22–32)
CREATININE: 0.62 mg/dL (ref 0.44–1.00)
Calcium: 8.1 mg/dL — ABNORMAL LOW (ref 8.9–10.3)
GFR calc non Af Amer: >60 mL/min (ref >60)
GLUCOSE: 111 mg/dL — AB (ref 65–99)
Potassium: 3.2 mmol/L — ABNORMAL LOW (ref 3.5–5.1)
Sodium: 146 mmol/L — ABNORMAL HIGH (ref 135–145)

## 2014-10-08 LAB — CBC
HCT: 31.2 % — ABNORMAL LOW (ref 36.0–46.0)
HEMOGLOBIN: 10.7 g/dL — AB (ref 12.0–15.0)
MCH: 30.3 pg (ref 26.0–34.0)
MCHC: 34.3 g/dL (ref 30.0–36.0)
MCV: 88.4 fL (ref 78.0–100.0)
Platelets: 151 10*3/uL (ref 150–400)
RBC: 3.53 MIL/uL — AB (ref 3.87–5.11)
RDW: 13.5 % (ref 11.5–15.5)
WBC: 7.3 10*3/uL (ref 4.0–10.5)

## 2014-10-08 LAB — APTT
aPTT: 54 seconds — ABNORMAL HIGH (ref 24–37)
aPTT: 52 seconds — ABNORMAL HIGH (ref 24–37)

## 2014-10-08 MED ORDER — POTASSIUM CHLORIDE CRYS ER 20 MEQ PO TBCR
40.0000 meq | EXTENDED_RELEASE_TABLET | Freq: Once | ORAL | Status: AC
  Administered 2014-10-08: 40 meq via ORAL
  Filled 2014-10-08: qty 2

## 2014-10-08 MED ORDER — ENOXAPARIN SODIUM 40 MG/0.4ML ~~LOC~~ SOLN
40.0000 mg | SUBCUTANEOUS | Status: DC
  Administered 2014-10-09 – 2014-10-12 (×2): 40 mg via SUBCUTANEOUS
  Filled 2014-10-08 (×4): qty 0.4

## 2014-10-08 MED ORDER — SODIUM CHLORIDE 0.45 % IV SOLN
INTRAVENOUS | Status: DC
  Administered 2014-10-08: 11:00:00 via INTRAVENOUS

## 2014-10-08 MED ORDER — NAPROXEN 250 MG PO TABS
250.0000 mg | ORAL_TABLET | Freq: Once | ORAL | Status: AC
  Administered 2014-10-08: 250 mg via ORAL
  Filled 2014-10-08: qty 1

## 2014-10-08 MED ORDER — FREE WATER
300.0000 mL | Freq: Three times a day (TID) | Status: DC
  Administered 2014-10-08 – 2014-10-13 (×7): 300 mL via ORAL

## 2014-10-08 NOTE — Progress Notes (Signed)
Pt refused to check blood sugar. Pt education was given. Pt aware of consequences.

## 2014-10-08 NOTE — Progress Notes (Signed)
Pt says now she feels like she's had a concussion would like to tell the DR she thinks she needs an MRI

## 2014-10-08 NOTE — Progress Notes (Signed)
Family Medicine Teaching Service Daily Progress Note Intern Pager: [phone]  Patient name: Kelsey Hanna Medical record number: [id number] Date of birth: 1950-11-04 Age: 64 y.o. Gender: female  Primary Care Provider: Saralyn Pilar, MD Consultants: Psychiatry Code Status: DNR  Pt Overview and Major Events to Date:  8/18: Admitted for AMS, SIRS, severe hypernatremia, mania  Assessment and Plan: Kelsey Hanna is a 64 y.o. female presenting with altered mental status and fever after being found down in her apartment. PMH is significant for Bipolar Disorder, T2DM, Hypothyroidism, ?afib  # Sepsis with hypotension possibly 2/2 Coag neg Staph bacteremia: Improving sepsis picture. Ethanol, APAP, salicylate level, RPR  all negative . LP studies neg for meningitis. S/P zosyn - 8/17 BCx: Coag negative staph, waiting for sensitivities, BCx 8/19 NGTD  - Ucx insignificant growth 3,000 cfu, CSF Cx - NGTD - Continue Vanc x7d total (D5) per discussion with ID  # Hypernatremia: Resolved. Na 165 on admission, now 146, s/p  1/2NS and D5W. D/Ced with resolution  - Continue 300 ml free water TID   - Will switch back to 1/2 NS for mIVF - Bmet daily   # Altered Mental status and bipolar- Manic state likely secondary to Bipolar manic state+ metabolic encephalopathy+ hypernatremia  -Continue home Quetiapine, cymbalta, depakote as able to take PO - Psych consult, -->> will need inpatient psych after medically cleared - Consider Haldol, if patient becomes agigtated - consider soft restraints if agitation remains an issue  #Thrombocytopenia: Stable,  Has recently been on heparin ppx in 04/2014 when she had a similar drop in plts. Heparin d/c'd 8/19 for concern for HIT. HIT panel negative - Switch to Lovenox as HIT neg   # AKI: Resolved. Cr 0.62 - Continue to trending BMET   # Hypothyroidism, currently hyperthyroid. TST .21, T4 .94. Potentially secondary to inappropriate dosing (med list has both  135mcg and 127mcg doses, daughter unable to confirm her actual dose).  - Repeat TSH, free T4 again as outpatient - Continue synthroid 17mcg daily  #DM2- Last A1c 5.9 09/06/2014, stable - Continue SSI and CBG ACHS  FEN/GI:  Soft diet, 1/2NS @ 29mL/hr Prophylaxis: switch back to Lovenox  Disposition: Pending clinical improvement, dispo to inpatient psych. Anticipate ready for d/c in 2 days after completion of 7d course of IV Vanc for bacteremia  Subjective:  Patient states that she feels ready to go home.  She wants to be disconnected from everything.  She states that she went home yesterday and ate [place] food (one of everything on the menu). Thinks it is December 1916.   Objective: Temp:  [97 F (36.1 C)-98.1 F (36.7 C)] 97.5 F (36.4 C) (08/21 0800) Pulse Rate:  [73-90] 86 (08/21 0800) Resp:  [15-28] 18 (08/21 0800) BP: (104-127)/(59-76) 127/69 mmHg (08/21 0800) SpO2:  [93 %-100 %] 100 % (08/21 0800) Physical Exam: General: Sitting in bedside chair, no acute distress.  HEENT: Left eye clouding, right eye Pupil RRL, MMM Cardiovascular: RRR no m/r/g Respiratory: CTAB, normal WOB no wheezes, rales or crackles.  Abdomen: soft, non tender, no organomegally palpated, + BS Skin:  no rashes noted Neuro: oriented to name and place, not to time or situation. Moving extremities  Psych: Pleasant and cooperative. Follows some commands. +flight of ideas, +pressured speech   Laboratory:  Recent Labs Lab 10/06/14 0512 10/07/14 0512 10/08/14 0115  WBC 12.8* 7.1 7.3  HGB 12.6 11.6* 10.7*  HCT 36.8 34.5* 31.2*  PLT 127* 142* 151    Recent  Labs Lab 10/04/14 1018  10/07/14 0512 10/07/14 0930 10/08/14 0115  NA 165*  < > 145 145 146*  K 4.0  < > 3.3* 3.6 3.2*  CL 127*  < > 114* 114* 116*  CO2 17*  < > 24 23 22   BUN 75*  < > 12 13 11   CREATININE 3.07*  < > 0.64 0.71 0.62  CALCIUM 9.9  < > 8.5* 8.7* 8.1*  PROT 7.8  --   --   --   --   BILITOT 1.7*  --   --   --   --   ALKPHOS  63  --   --   --   --   ALT 54  --   --   --   --   AST 78*  --   --   --   --   GLUCOSE 118*  < > 89 98 111*  < > = values in this interval not displayed.   Imaging/Diagnostic Tests: CT chest/abd/pelvis  IMPRESSION: No acute finding chest, abdomen or pelvis.  CT head  IMPRESSION: 1. Stable atrophy and white matter disease. 2. No acute intracranial abnormality. 3. Stable degenerative changes in the cervical spine. 4. No acute fracture or traumatic subluxation.   Virginia Crews, MD 10/08/2014, 10:16 AM PGY-2, [facility] Intern pager: [phone], text pages welcome

## 2014-10-08 NOTE — Progress Notes (Signed)
Kelsey Hanna for Argatroban Indication: r/o HIT  Allergies  Allergen Reactions  . Haldol [Haloperidol] Other (See Comments)    Shaking of the hands, grinding her teeth  . Lithium Other (See Comments)    "makes me feel spaced out" slurred speech   Patient Measurements: Height: 5\' 4"  (162.6 cm) Weight: 143 lb (64.864 kg) IBW/kg (Calculated) : 54.7 Vital Signs: Temp: 98 F (36.7 C) (08/20 2331) Temp Source: Oral (08/20 2331) BP: 115/59 mmHg (08/20 2331) Pulse Rate: 90 (08/20 2331) Labs:  Recent Labs  10/06/14 0512  10/07/14 0512 10/07/14 0930 10/07/14 1315 10/07/14 1911 10/08/14 0115  HGB 12.6  --  11.6*  --   --   --  10.7*  HCT 36.8  --  34.5*  --   --   --  31.2*  PLT 127*  --  142*  --   --   --  151  APTT  --   < > 48*  --  49* 43* 54*  CREATININE 0.77  < > 0.64 0.71  --   --  0.62  < > = values in this interval not displayed.  Estimated Creatinine Clearance: 60.5 mL/min (by C-G formula based on Cr of 0.62).  Assessment: 64 yo female with sepsis,thrombocytopenia, possible HIT for argatroban  Goal of Therapy:  Target aPTT range of 50-90 seconds Monitor platelets by anticoagulation protocol: Yes   Plan:  -Continue argatroban at current rate Follow-up am labs to verify  Phillis Knack, PharmD, BCPS   10/08/2014 2:37 AM   A

## 2014-10-08 NOTE — Progress Notes (Signed)
Pt wanted to talk to the nurse - says she's already gone outside today , ate at [facility], & went & bought a mercedes car today. Reoriented carefully otherwise pt becomes angry & irritable.

## 2014-10-08 NOTE — Progress Notes (Signed)
[place] for Argatroban Indication: r/o HIT  Allergies  Allergen Reactions  . Haldol [Haloperidol] Other (See Comments)    Shaking of the hands, grinding her teeth  . Lithium Other (See Comments)    "makes me feel spaced out" slurred speech   Patient Measurements: Height: 5\' 4"  (162.6 cm) Weight: 143 lb (64.864 kg) IBW/kg (Calculated) : 54.7 Vital Signs: Temp: 97.5 F (36.4 C) (08/21 0800) Temp Source: Oral (08/21 0800) BP: 127/69 mmHg (08/21 0800) Pulse Rate: 86 (08/21 0800) Labs:  Recent Labs  10/06/14 0512  10/07/14 0512 10/07/14 0930  10/07/14 1911 10/08/14 0115 10/08/14 1015  HGB 12.6  --  11.6*  --   --   --  10.7*  --   HCT 36.8  --  34.5*  --   --   --  31.2*  --   PLT 127*  --  142*  --   --   --  151  --   APTT  --   < > 48*  --   < > 43* 54* 52*  CREATININE 0.77  < > 0.64 0.71  --   --  0.62  --   < > = values in this interval not displayed.  Estimated Creatinine Clearance: 60.5 mL/min (by C-G formula based on Cr of 0.62).  Assessment: 64 yo female with sepsis,thrombocytopenia, possible HIT - labs still pending.  Argatroban has been running without noted complications.  She has had her rate adjusted multiple times but now is within target range at 52 sec.  Her H/H has trended down a bit further as well but no noted bleeding complications, her platelets have trended back upward.    HIT was negative - spoke with medical team and they feel safe to transition her back to [place] for DVT prophylaxis.    Goal of Therapy:  Target aPTT range of 50-90 seconds Monitor platelets by anticoagulation protocol: Yes   Plan:  - Continue Argatroban at 2.19mcg/hr (rate of 10.94ml/hr) - Recheck aPTT in AM  - Monitor for s/s of bleeding.  - F/U HIT panel and order SRA if positive  Rober Minion, PharmD., MS Clinical Pharmacist Pager:  [phone] Thank you for allowing pharmacy to be part of this patients care team. 10/08/2014 10:47  AM

## 2014-10-08 NOTE — Progress Notes (Signed)
ANTIBIOTIC CONSULT NOTE - Follow Up  Pharmacy Consult for vancomycin Indication: bacteremia  Allergies  Allergen Reactions  . Haldol [Haloperidol] Other (See Comments)    Shaking of the hands, grinding her teeth  . Lithium Other (See Comments)    "makes me feel spaced out" slurred speech   Patient Measurements: Height: 5\' 4"  (162.6 cm) Weight: 143 lb (64.864 kg) IBW/kg (Calculated) : 54.7  Vital Signs: Temp: 97.5 F (36.4 C) (08/21 0800) Temp Source: Oral (08/21 0800) BP: 127/69 mmHg (08/21 0800) Pulse Rate: 86 (08/21 0800) Intake/Output from previous day: 08/20 0701 - 08/21 0700 In: 2631 [P.O.:120; I.V.:2211; IV Piggyback:300] Out: 700 [Urine:700] Intake/Output from this shift: Total I/O In: 520 [P.O.:300; I.V.:220] Out: -   Labs:  Recent Labs  10/06/14 0512  10/07/14 0512 10/07/14 0930 10/08/14 0115  WBC 12.8*  --  7.1  --  7.3  HGB 12.6  --  11.6*  --  10.7*  PLT 127*  --  142*  --  151  CREATININE 0.77  < > 0.64 0.71 0.62  < > = values in this interval not displayed. Estimated Creatinine Clearance: 60.5 mL/min (by C-G formula based on Cr of 0.62). No results for input(s): VANCOTROUGH, VANCOPEAK, VANCORANDOM, GENTTROUGH, GENTPEAK, GENTRANDOM, TOBRATROUGH, TOBRAPEAK, TOBRARND, AMIKACINPEAK, AMIKACINTROU, AMIKACIN in the last 72 hours.   Microbiology: Recent Results (from the past 720 hour(s))  Urine culture     Status: None   Collection Time: 09/08/14  7:28 PM  Result Value Ref Range Status   Specimen Description   Final    URINE, CLEAN CATCH Performed at [place] Requests   Final    Normal Performed at [facility]    Culture   Final    MULTIPLE SPECIES PRESENT, SUGGEST RECOLLECTION Performed at [facility]    Report Status 09/10/2014 FINAL  Final  Blood Culture (routine x 2)     Status: None (Preliminary result)   Collection Time: 10/04/14 10:18 AM  Result Value Ref Range Status   Specimen Description BLOOD HAND LEFT  Final   Special Requests BOTTLES DRAWN AEROBIC AND ANAEROBIC 5CC  Final   Culture NO GROWTH 3 DAYS  Final   Report Status PENDING  Incomplete  Blood Culture (routine x 2)     Status: None   Collection Time: 10/04/14 10:24 AM  Result Value Ref Range Status   Specimen Description BLOOD RIGHT ANTECUBITAL  Final   Special Requests BOTTLES DRAWN AEROBIC AND ANAEROBIC 5CC  Final   Culture  Setup Time   Final    GRAM POSITIVE COCCI IN CLUSTERS ANAEROBIC BOTTLE ONLY CRITICAL RESULT CALLED TO, READ BACK BY AND VERIFIED WITH: Alyssa Grove RN [phone] GREEN R CONFIRMED BY J. HOLMES    Culture   Final    STAPHYLOCOCCUS SPECIES (COAGULASE NEGATIVE) THE SIGNIFICANCE OF ISOLATING THIS ORGANISM FROM A SINGLE SET OF BLOOD CULTURES WHEN MULTIPLE SETS ARE DRAWN IS UNCERTAIN. PLEASE NOTIFY THE MICROBIOLOGY DEPARTMENT WITHIN ONE WEEK IF SPECIATION AND SENSITIVITIES ARE REQUIRED.    Report Status 10/07/2014 FINAL  Final  Urine culture     Status: None   Collection Time: 10/04/14 11:07 AM  Result Value Ref Range Status   Specimen Description URINE, CATHETERIZED  Final   Special Requests NONE  Final   Culture 3,000 COLONIES/mL INSIGNIFICANT GROWTH  Final   Report Status 10/05/2014 FINAL  Final  CSF culture with Stat gram stain     Status: None  Collection Time: 10/04/14  3:05 PM  Result Value Ref Range Status   Specimen Description CSF  Final   Special Requests Normal  Final   Gram Stain   Final    WBC PRESENT, PREDOMINANTLY MONONUCLEAR NO ORGANISMS SEEN CYTOSPIN    Culture NO GROWTH 3 DAYS  Final   Report Status 10/07/2014 FINAL  Final  MRSA PCR Screening     Status: None   Collection Time: 10/04/14  8:02 PM  Result Value Ref Range Status   MRSA by PCR NEGATIVE NEGATIVE Final    Comment:        The GeneXpert MRSA Assay (FDA approved for NASAL specimens only), is one component of a comprehensive MRSA colonization surveillance program. It is not intended  to diagnose MRSA infection nor to guide or monitor treatment for MRSA infections.   Culture, blood (routine x 2)     Status: None (Preliminary result)   Collection Time: 10/06/14  1:15 PM  Result Value Ref Range Status   Specimen Description BLOOD RIGHT ANTECUBITAL  Final   Special Requests BOTTLES DRAWN AEROBIC AND ANAEROBIC 10CC  Final   Culture NO GROWTH 1 DAY  Final   Report Status PENDING  Incomplete  Culture, blood (routine x 2)     Status: None (Preliminary result)   Collection Time: 10/06/14  1:30 PM  Result Value Ref Range Status   Specimen Description BLOOD LEFT ANTECUBITAL  Final   Special Requests BOTTLES DRAWN AEROBIC AND ANAEROBIC 10CC  Final   Culture NO GROWTH 1 DAY  Final   Report Status PENDING  Incomplete    Medical History: Past Medical History  Diagnosis Date  . Bipolar 1 disorder   . Hypothyroid   . Dyslipidemia   . Retinal detachment   . Sleep apnea   . Diabetes mellitus     dx within last yr....just takes pills  . Cancer     cancer of uterus...no chemo or radiation   Assessment: 74 YOF with history of bipolar disorder, noncompliance and overdose who was found down by her landlord after daughter could not contact x3 days. Tachycardic and hypotensive in ED with fever up to 104. Lactic acid elevated at 3.35. Blood cx is now 1/2 for GPC in clusters - repeat cultures pending. She has improved with creatinine back to baseline of 0.7 with an estimated crcl of 25ml/min.  She is afebrile and WBC wnl.  She has responded to IV Vancomycin without noted complications.  Goal of Therapy:  Vancomycin trough level 15-20 mcg/ml  Resolution of infection  Plan:  Continue IV vancomycin 750mg  IV q12h Monitor clinical picture, renal function, VT at Css F/U C&S, abx deescalation / LOT  Rober Minion, PharmD., MS Clinical Pharmacist Pager:  [phone] Thank you for allowing pharmacy to be part of this patients care team. 10/08/2014,11:09 AM

## 2014-10-08 NOTE — Discharge Summary (Signed)
[name]  Patient name: Kelsey Hanna Medical record number: [id number] Date of birth: 03/09/1950 Age: 64 y.o. Gender: female Date of Admission: 10/04/2014  Date of Discharge: 10/12/2014  Admitting Physician: Leeanne Rio, MD  Primary Care Provider: Saralyn Pilar, MD Consultants:  Infectious disease Psyhiatry  Indication for Hospitalization:  Bipolar 1 Manic State Hypernatremia Concern for Sepsis  Discharge Diagnoses/Problem List:  Patient Active Problem List   Diagnosis Date Noted  . Psychoses   . Bacteremia   . Sepsis with hypotension 10/04/2014  . Toxic metabolic encephalopathy 40/98/1191  . AKI (acute kidney injury) 10/04/2014  . Acute renal failure syndrome   . Altered mental status   . Hypernatremia   . Pedal edema 09/12/2014  . Bipolar disorder, current episode manic without psychotic features, severe   . Bipolar affective disorder, current episode manic without psychotic symptoms 09/05/2014  . Bipolar affective disorder, current episode manic with psychotic symptoms 09/05/2014  . Bipolar affective disorder, manic 09/03/2014  . Encounter for preadmission testing   . Head revolving around 07/27/2014  . BP (high blood pressure) 05/23/2014  . AF (paroxysmal atrial fibrillation) 05/23/2014  . PNA (pneumonia) 04/27/2014  . Type 2 diabetes mellitus 04/27/2014  . Essential hypertension 04/27/2014  . Hypothyroidism 04/27/2014  . Tobacco abuse 04/27/2014  . Bipolar I disorder, most recent episode (or current) unspecified 04/20/2014  . Acute encephalopathy 04/18/2014  . Unresponsiveness   . Acute respiratory failure   . Encounter for feeding tube placement   . Rapid atrial fibrillation   . Shortness of breath   . Gout 08/25/2013  . 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 HLD (hyperlipidemia) 08/30/2009  . Apnea, sleep 04/05/2009     Disposition: [facility]  Discharge Condition:  Stable  Discharge Exam:   Temp: [97.7 F (36.5 C)-98.1 F (36.7 C)] 98.1 F (36.7 C) (08/23 0540) Pulse Rate: [94] 94 (08/22 0934) Resp: [19-23] 23 (08/23 0000) BP: (122-139)/(72-81) 122/72 mmHg (08/23 0540) SpO2: [98 %-99 %] 98 % (08/23 0540) Physical Exam: General: Lying in bed, naked,, no acute distress.  HEENT: Left eye clouding, right eye EOMI, MMM Cardiovascular: RRR no m/r/g Respiratory: CTAB, normal WOB no wheezes, rales or crackles.  Abdomen: soft, non tender, no organomegally palpated, + BS Skin: no rashes noted Neuro: oriented to name and place, and time Psych: Pleasant and cooperative. Following commands today   Brief Hospital Course:  Kelsey Hanna is a 63 y.o. female who presented with with altered mental status and fever after being found down in her apartment. PMH is significant for Bipolar Disorder, T2DM, Hypothyroidism,   On admission to the ED she was found to be hypernatremic to 165. She was started on 1/2 NS IV fluids with basic metabolic panels every 4 hours. Her sodium slowly trended down, but given the very slow rate of correction her fluids were switched to D5W with scheduled oral free water with resolution of her hypernatremia  She was additionally febrile to 104.7 with a WBC of 17.7. Given concern for sepsis, she had blood and urine cultures and vancomycin and zosyn were started. Her fever and WBC resolved. Her blood culture grew coagulase negative Staph concerning for a contaminant rather than sepsis. Her zosyn was discontinued after 2 days, and she was maintained on vancomycin to complete a 7 day course. Blood cultures were repeated and were negative x 5 days at time of discharge. Urine cultures had insignificant growth.  For her Manic state, she  was continued on her home bipolar medications when proven she could tolerate oral intake. Psychiatry was consulted and they followed her care while inpatient. Her mental status slowly improved and the  ultimately deemed she was stable to be discharged to home with close follow up with the ACT team   Issues for Follow Up:  - Consider outpatient thyroid testing - Control of bipolar symptoms - ACT team follow up  Significant Procedures:  None  Significant Labs and Imaging:   Recent Labs Lab 10/07/14 0512 10/08/14 0115 10/09/14 0302  WBC 7.1 7.3 7.9  HGB 11.6* 10.7* 10.4*  HCT 34.5* 31.2* 30.8*  PLT 142* 151 156    Recent Labs Lab 10/08/14 0115 10/09/14 0302 10/10/14 0336 10/11/14 0247 10/12/14 0630  NA 146* 144 143 141 144  K 3.2* 3.7 3.7 3.3* 4.3  CL 116* 114* 112* 110 110  CO2 22 20* 20* 22 26  GLUCOSE 111* 105* 111* 111* 92  BUN 11 9 9 9 10   CREATININE 0.62 0.56 0.75 0.66 0.51  CALCIUM 8.1* 8.1* 8.8* 8.4* 9.0      Results/Tests Pending at Time of Discharge:  None  Discharge Medications:    Medication List    STOP taking these medications        colchicine 0.6 MG tablet      TAKE these medications        aspirin 325 MG EC tablet  Take 1 tablet (325 mg total) by mouth daily. For heart health     atorvastatin 40 MG tablet  Commonly known as:  LIPITOR  Take 1 tablet (40 mg total) by mouth every evening. For high cholesterol     cyclobenzaprine 10 MG tablet  Commonly known as:  FLEXERIL  Take 1 tablet (10 mg total) by mouth 3 (three) times daily as needed for muscle spasms.     diclofenac sodium 1 % Gel  Commonly known as:  VOLTAREN  Apply 1 application topically daily. For arthritic pain     divalproex 500 MG 24 hr tablet  Commonly known as:  DEPAKOTE ER  Take 1 tablet (500 mg total) by mouth daily.     DULoxetine 30 MG capsule  Commonly known as:  CYMBALTA  Take 1 capsule (30 mg total) by mouth daily.     fluticasone 50 MCG/ACT nasal spray  Commonly known as:  FLONASE  Place 2 sprays into both nostrils 2 (two) times daily. For allergies     furosemide 80 MG tablet  Commonly known as:  LASIX  Take 1 tablet (80 mg total) by mouth  daily.     gabapentin 300 MG capsule  Commonly known as:  NEURONTIN  Take 1 capsule (300 mg total) by mouth 3 (three) times daily.     ibuprofen 200 MG tablet  Commonly known as:  ADVIL,MOTRIN  Take 200 mg by mouth every 6 (six) hours as needed for moderate pain.     lisinopril 5 MG tablet  Commonly known as:  PRINIVIL,ZESTRIL  Take 1 tablet (5 mg total) by mouth daily. For high blood pressure     LORazepam 0.5 MG tablet  Commonly known as:  ATIVAN  Take 1 tablet (0.5 mg total) by mouth 2 (two) times daily. For severe anxiety     meclizine 12.5 MG tablet  Commonly known as:  ANTIVERT  Take 1 tablet (12.5 mg total) by mouth 3 (three) times daily as needed for dizziness.     meloxicam 7.5 MG tablet  Commonly  known as:  MOBIC  Take 1 tablet (7.5 mg total) by mouth daily.     metFORMIN 500 MG tablet  Commonly known as:  GLUCOPHAGE  Take 1 tablet (500 mg total) by mouth daily with breakfast. For diabetes mellitus     metoprolol 50 MG tablet  Commonly known as:  LOPRESSOR  Take 1 tablet (50 mg total) by mouth 2 (two) times daily with a meal. For high blood pressure     naproxen sodium 220 MG tablet  Commonly known as:  ANAPROX  Take 220 mg by mouth 2 (two) times daily as needed (pain).     nicotine polacrilex 2 MG gum  Commonly known as:  NICORETTE  Take 1 each (2 mg total) by mouth as needed for smoking cessation.     Potassium Chloride ER 20 MEQ Tbcr  Take 20 mEq by mouth daily. For low potassium     QUEtiapine 100 MG tablet  Commonly known as:  SEROQUEL  Take 1 tablet (100 mg total) by mouth 2 (two) times daily.     SYNTHROID 137 MCG tablet  Generic drug:  levothyroxine  Take 137 mcg by mouth daily before breakfast.     zolpidem 5 MG tablet  Commonly known as:  AMBIEN  Take 1 tablet (5 mg total) by mouth at bedtime as needed for sleep.        Discharge Instructions: Please refer to Patient Instructions section of EMR for full details.  Patient was counseled  important signs and symptoms that should prompt return to medical care, changes in medications, dietary instructions, activity restrictions, and follow up appointments.   Follow-Up Appointments: Follow-up Information    Schedule an appointment as soon as possible for a visit with Saralyn Pilar, MD.   Specialty:  [facility] Medicine   Contact information:   [place] [address] [phone]       Warren. Lincoln Brigham MD, [facility] Resident PGY-2 Pager [phone]

## 2014-10-09 DIAGNOSIS — F319 Bipolar disorder, unspecified: Secondary | ICD-10-CM

## 2014-10-09 DIAGNOSIS — R4182 Altered mental status, unspecified: Secondary | ICD-10-CM

## 2014-10-09 LAB — BASIC METABOLIC PANEL
ANION GAP: 10 (ref 5–15)
BUN: 9 mg/dL (ref 6–20)
CHLORIDE: 114 mmol/L — AB (ref 101–111)
CO2: 20 mmol/L — AB (ref 22–32)
Calcium: 8.1 mg/dL — ABNORMAL LOW (ref 8.9–10.3)
Creatinine, Ser: 0.56 mg/dL (ref 0.44–1.00)
GFR calc Af Amer: >60 mL/min (ref >60)
GFR calc non Af Amer: >60 mL/min (ref >60)
GLUCOSE: 105 mg/dL — AB (ref 65–99)
POTASSIUM: 3.7 mmol/L (ref 3.5–5.1)
Sodium: 144 mmol/L (ref 135–145)

## 2014-10-09 LAB — CULTURE, BLOOD (ROUTINE X 2): Culture: NO GROWTH

## 2014-10-09 LAB — CBC
HEMATOCRIT: 30.8 % — AB (ref 36.0–46.0)
HEMOGLOBIN: 10.4 g/dL — AB (ref 12.0–15.0)
MCH: 30.8 pg (ref 26.0–34.0)
MCHC: 33.8 g/dL (ref 30.0–36.0)
MCV: 91.1 fL (ref 78.0–100.0)
Platelets: 156 10*3/uL (ref 150–400)
RBC: 3.38 MIL/uL — ABNORMAL LOW (ref 3.87–5.11)
RDW: 14 % (ref 11.5–15.5)
WBC: 7.9 10*3/uL (ref 4.0–10.5)

## 2014-10-09 NOTE — Consult Note (Signed)
[facility] Face-to-Face Psychiatry Consult follow up  Reason for Consult:  Bipolar disorder and altered mental status Referring Physician:  Dr. Ardelia Mems Patient Identification: Kelsey Hanna MRN:  [id number] Principal Diagnosis: Altered mental status Diagnosis:   Patient Active Problem List   Diagnosis Date Noted  . Psychoses [F29]   . Bacteremia [R78.81]   . Sepsis with hypotension [A41.9] 10/04/2014  . Toxic metabolic encephalopathy [W09] 10/04/2014  . AKI (acute kidney injury) [N17.9] 10/04/2014  . Acute renal failure syndrome [N17.9]   . Altered mental status [R41.82]   . Hypernatremia [E87.0]   . Pedal edema [R60.0] 09/12/2014  . Bipolar disorder, current episode manic without psychotic features, severe [F31.13]   . Bipolar affective disorder, current episode manic without psychotic symptoms [F31.10] 09/05/2014  . Bipolar affective disorder, current episode manic with psychotic symptoms [F31.2] 09/05/2014  . Bipolar affective disorder, manic [F31.10] 09/03/2014  . Encounter for preadmission testing [Z01.818]   . Head revolving around [R42] 07/27/2014  . BP (high blood pressure) [I10] 05/23/2014  . AF (paroxysmal atrial fibrillation) [I48.0] 05/23/2014  . PNA (pneumonia) [J18.9] 04/27/2014  . Type 2 diabetes mellitus [E11.9] 04/27/2014  . Essential hypertension [I10] 04/27/2014  . Hypothyroidism [E03.9] 04/27/2014  . Tobacco abuse [Z72.0] 04/27/2014  . Bipolar I disorder, most recent episode (or current) unspecified [F31.9] 04/20/2014  . Acute encephalopathy [G93.40] 04/18/2014  . Unresponsiveness [R40.4]   . Acute respiratory failure [J96.00]   . Encounter for feeding tube placement [Z87.898]   . Rapid atrial fibrillation [I48.91]   . Shortness of breath [R06.02]   . Gout [M10.9] 08/25/2013  . 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 HLD (hyperlipidemia) [E78.5] 08/30/2009  . Apnea, sleep [G47.30] 04/05/2009    Total Time spent with patient: 30 minutes  Subjective:    Kelsey Hanna is a 64 y.o. female patient admitted with bipolar mania and altered mental status.  HPI: Kelsey Hanna is a 64 y.o. female admitted to [facility] with altered mental status and history of bipolar disorder. Patient has been living by herself and has been in good contact with her daughter. Patient presented with high fever with 104, tachycardiac, hypotensive with minimal responsiveness. Patient is awake but not alert and oriented during this evaluation. Patient is a poor historian. Review of electronic medical records indicated patient was recently admitted to [facility] for bipolar mania symptoms and discharged to outpatient care in July 2016 when she was stabilized on medication management. She has tangential, pressured speech and mild aggittation, occasionally puling at lines and disrobing. Her daughter, [name] is her mother's primary Scientist, research (medical). Her UDS was negative for narcotics. Patient cannot contract for safety and does not meet criteria for capacity to make her own medical decisions at this time.  HPI Elements:   Location:  Sepsis and bipolar mania. Quality:  Poor. Severity:  Unable to care for herself. Timing:  Altered mental status for unknown reason. Duration:  Less than 4 weeks. Context:  Psychosocial stressors..   Interval history: Patient has slowly improving her mental status, she is aware of her being admitted to [facility], year 2016, her home address, name of the city- [place] and states [place]. She is still confused about month and date. She complaints of feeling tired and weak, aware of her ulcer. She is calm, cooperative and pleasant today. She states that she is legally blind and able to manage alone at home with or without walker. She requested to  obtain physical therapy evaluation because she wants to be mobile independently.   Past Medical History:  Past Medical History  Diagnosis Date   . Bipolar 1 disorder   . Hypothyroid   . Dyslipidemia   . Retinal detachment   . Sleep apnea   . Diabetes mellitus     dx within last yr....just takes pills  . Cancer     cancer of uterus...no chemo or radiation    Past Surgical History  Procedure Laterality Date  . Abdominal hysterectomy    . Total abdominal hysterectomy w/ bilateral salpingoophorectomy    . Tonsillectomy    . Lumbar laminectomy/decompression microdiscectomy  07/17/2011    Procedure: LUMBAR LAMINECTOMY/DECOMPRESSION MICRODISCECTOMY;  Surgeon: Emilee Hero, MD;  Location: [facility] OR;  Service: Orthopedics;  Laterality: Left;  Left sided lumbar 4-5 microdisectomy   Family History: No family history on file. Social History:  History  Alcohol Use No    Comment: socially     History  Drug Use No    Social History   Social History  . Marital Status: Divorced    Spouse Name: N/A  . Number of Children: N/A  . Years of Education: N/A   Social History Main Topics  . Smoking status: Current Every Day Smoker -- 0.25 packs/day for 35 years    Types: Cigarettes    Last Attempt to Quit: 07/09/1999  . Smokeless tobacco: None  . Alcohol Use: No     Comment: socially  . Drug Use: No  . Sexual Activity: Not Asked   Other Topics Concern  . None   Social History Narrative   Additional Social History:                          Allergies:   Allergies  Allergen Reactions  . Haldol [Haloperidol] Other (See Comments)    Shaking of the hands, grinding her teeth  . Lithium Other (See Comments)    "makes me feel spaced out" slurred speech    Labs:  Results for orders placed or performed during the hospital encounter of 10/04/14 (from the past 48 hour(s))  Glucose, capillary     Status: Abnormal   Collection Time: 10/07/14  1:07 PM  Result Value Ref Range   Glucose-Capillary 129 (H) 65 - 99 mg/dL  APTT     Status: Abnormal   Collection Time: 10/07/14  1:15 PM  Result Value Ref Range   aPTT 49  (H) 24 - 37 seconds    Comment:        IF BASELINE aPTT IS ELEVATED, SUGGEST PATIENT RISK ASSESSMENT BE USED TO DETERMINE APPROPRIATE ANTICOAGULANT THERAPY.   APTT     Status: Abnormal   Collection Time: 10/07/14  7:11 PM  Result Value Ref Range   aPTT 43 (H) 24 - 37 seconds    Comment:        IF BASELINE aPTT IS ELEVATED, SUGGEST PATIENT RISK ASSESSMENT BE USED TO DETERMINE APPROPRIATE ANTICOAGULANT THERAPY.   APTT     Status: Abnormal   Collection Time: 10/08/14  1:15 AM  Result Value Ref Range   aPTT 54 (H) 24 - 37 seconds    Comment:        IF BASELINE aPTT IS ELEVATED, SUGGEST PATIENT RISK ASSESSMENT BE USED TO DETERMINE APPROPRIATE ANTICOAGULANT THERAPY.   Basic metabolic panel     Status: Abnormal   Collection Time: 10/08/14  1:15 AM  Result Value Ref Range  Sodium 146 (H) 135 - 145 mmol/L   Potassium 3.2 (L) 3.5 - 5.1 mmol/L   Chloride 116 (H) 101 - 111 mmol/L   CO2 22 22 - 32 mmol/L   Glucose, Bld 111 (H) 65 - 99 mg/dL   BUN 11 6 - 20 mg/dL   Creatinine, Ser 2.76 0.44 - 1.00 mg/dL   Calcium 8.1 (L) 8.9 - 10.3 mg/dL   GFR calc non Af Amer >60 >60 mL/min   GFR calc Af Amer >60 >60 mL/min    Comment: (NOTE) The eGFR has been calculated using the CKD EPI equation. This calculation has not been validated in all clinical situations. eGFR's persistently <60 mL/min signify possible Chronic Kidney Disease.    Anion gap 8 5 - 15  CBC     Status: Abnormal   Collection Time: 10/08/14  1:15 AM  Result Value Ref Range   WBC 7.3 4.0 - 10.5 K/uL   RBC 3.53 (L) 3.87 - 5.11 MIL/uL   Hemoglobin 10.7 (L) 12.0 - 15.0 g/dL   HCT 18.4 (L) 85.9 - 27.6 %   MCV 88.4 78.0 - 100.0 fL   MCH 30.3 26.0 - 34.0 pg   MCHC 34.3 30.0 - 36.0 g/dL   RDW 39.4 32.0 - 03.7 %   Platelets 151 150 - 400 K/uL  APTT     Status: Abnormal   Collection Time: 10/08/14 10:15 AM  Result Value Ref Range   aPTT 52 (H) 24 - 37 seconds    Comment:        IF BASELINE aPTT IS ELEVATED, SUGGEST  PATIENT RISK ASSESSMENT BE USED TO DETERMINE APPROPRIATE ANTICOAGULANT THERAPY.   Basic metabolic panel     Status: Abnormal   Collection Time: 10/09/14  3:02 AM  Result Value Ref Range   Sodium 144 135 - 145 mmol/L   Potassium 3.7 3.5 - 5.1 mmol/L   Chloride 114 (H) 101 - 111 mmol/L   CO2 20 (L) 22 - 32 mmol/L   Glucose, Bld 105 (H) 65 - 99 mg/dL   BUN 9 6 - 20 mg/dL   Creatinine, Ser 9.44 0.44 - 1.00 mg/dL   Calcium 8.1 (L) 8.9 - 10.3 mg/dL   GFR calc non Af Amer >60 >60 mL/min   GFR calc Af Amer >60 >60 mL/min    Comment: (NOTE) The eGFR has been calculated using the CKD EPI equation. This calculation has not been validated in all clinical situations. eGFR's persistently <60 mL/min signify possible Chronic Kidney Disease.    Anion gap 10 5 - 15  CBC     Status: Abnormal   Collection Time: 10/09/14  3:02 AM  Result Value Ref Range   WBC 7.9 4.0 - 10.5 K/uL   RBC 3.38 (L) 3.87 - 5.11 MIL/uL   Hemoglobin 10.4 (L) 12.0 - 15.0 g/dL   HCT 46.1 (L) 90.1 - 22.2 %   MCV 91.1 78.0 - 100.0 fL   MCH 30.8 26.0 - 34.0 pg   MCHC 33.8 30.0 - 36.0 g/dL   RDW 41.1 46.4 - 31.4 %   Platelets 156 150 - 400 K/uL    Vitals: Blood pressure 123/61, pulse 94, temperature 97.9 F (36.6 C), temperature source Oral, resp. rate 19, height 5\' 4"  (1.626 m), weight 64.864 kg (143 lb), SpO2 99 %.  Risk to Self: Is patient at risk for suicide?: No Risk to Others:   Prior Inpatient Therapy:   Prior Outpatient Therapy:    Current Facility-Administered Medications  Medication Dose Route Frequency Provider Last Rate Last Dose  . acetaminophen (TYLENOL) tablet 650 mg  650 mg Oral Q6H PRN Leone Brand, MD   650 mg at 10/09/14 1000   Or  . acetaminophen (TYLENOL) suppository 650 mg  650 mg Rectal Q6H PRN Leone Brand, MD      . antiseptic oral rinse (CPC / CETYLPYRIDINIUM CHLORIDE 0.05%) solution 7 mL  7 mL Mouth Rinse BID Leeanne Rio, MD   7 mL at 10/09/14 1003  . divalproex (DEPAKOTE ER)  24 hr tablet 500 mg  500 mg Oral Daily Veatrice Bourbon, MD   500 mg at 10/09/14 1002  . DULoxetine (CYMBALTA) DR capsule 30 mg  30 mg Oral Daily Veatrice Bourbon, MD   30 mg at 10/09/14 1002  . enoxaparin (LOVENOX) injection 40 mg  40 mg Subcutaneous Q24H Durwin Nora, RPH   40 mg at 10/09/14 1007  . feeding supplement (BOOST / RESOURCE BREEZE) liquid 1 Container  1 Container Oral TID BM Rogue Bussing, RD   1 Container at 10/09/14 1007  . free water 300 mL  300 mL Oral 3 times per day Virginia Crews, MD   300 mL at 10/09/14 0900  . insulin aspart (novoLOG) injection 0-5 Units  0-5 Units Subcutaneous QHS Leone Brand, MD   0 Units at 10/04/14 2200  . insulin aspart (novoLOG) injection 0-9 Units  0-9 Units Subcutaneous TID WC Leone Brand, MD   2 Units at 10/05/14 1727  . levothyroxine (SYNTHROID, LEVOTHROID) tablet 137 mcg  137 mcg Oral QAC breakfast Veatrice Bourbon, MD   137 mcg at 10/09/14 1002  . ondansetron (ZOFRAN) tablet 4 mg  4 mg Oral Q6H PRN Leone Brand, MD       Or  . ondansetron [facility]) injection 4 mg  4 mg Intravenous Q6H PRN Leone Brand, MD      . QUEtiapine (SEROQUEL) tablet 100 mg  100 mg Oral TID Ambrose Finland, MD   100 mg at 10/09/14 1002  . sodium chloride 0.9 % injection 3 mL  3 mL Intravenous Q12H Leone Brand, MD   3 mL at 10/08/14 2142  . vancomycin (VANCOCIN) IVPB 750 mg/150 ml premix  750 mg Intravenous Q12H Durwin Nora, RPH   750 mg at 10/09/14 4098    Musculoskeletal: Strength & Muscle Tone: increased Gait & Station: unable to stand Patient leans: N/A  Psychiatric Specialty Exam: Physical Exam   ROS   Blood pressure 123/61, pulse 94, temperature 97.9 F (36.6 C), temperature source Oral, resp. rate 19, height $RemoveBe'5\' 4"'louniEYRT$  (1.626 m), weight 64.864 kg (143 lb), SpO2 99 %.Body mass index is 24.53 kg/(m^2).  General Appearance: Bizarre, Disheveled and Guarded  Eye Contact::  Minimal  Speech:  Blocked and Garbled  Volume:  Decreased   Mood:  Anxious, Euphoric and Irritable  Affect:  Non-Congruent, Inappropriate and Labile  Thought Process:  Disorganized  Orientation:  Negative  Thought Content:  Rumination  Suicidal Thoughts:  No  Homicidal Thoughts:  No  Memory:  Immediate;   Fair Recent;   Poor  Judgement:  Impaired  Insight:  Lacking  Psychomotor Activity:  Restlessness  Concentration:  Poor  Recall:  Poor  Fund of Knowledge:Poor  Language: Fair  Akathisia:  Negative  Handed:  Right  AIMS (if indicated):     Assets:  Catering manager Housing Leisure Time Resilience Social Support Transportation  ADL's:  Impaired  Cognition: Impaired,  Moderate  Sleep:      Medical Decision Making: Review of Psycho-Social Stressors (1), Review or order clinical lab tests (1), Established Problem, Worsening (2), Review of Last Therapy Session (1), Review or order medicine tests (1), Review of Medication Regimen & Side Effects (2) and Review of New Medication or Change in Dosage (2)  Treatment Plan Summary: Patient has bipolar mania and altered mental status secondary to sepsis and unable to care for herself. Daily contact with patient to assess and evaluate symptoms and progress in treatment and Medication management  Plan:  Continue Depakote ER 500 mg PO Qam for mood swings - monitor of valproice acid level Continue Cymbalta 30 mg Qam for depression Continue Seroquel 100 mg PO TID for mood swings Recommend psychiatric Inpatient admission when medically cleared. Supportive therapy provided about ongoing stressors. Appreciate psychiatric consultation Please contact [phone] or [phone] if needs further assistance  Disposition: Admit to acute psychiatric hospitalization when medically stable  Alecea Trego,JANARDHAHA R. 10/09/2014 11:46 AM

## 2014-10-09 NOTE — Progress Notes (Signed)
Family Medicine Teaching Service Daily Progress Note Intern Pager: [phone]  Patient name: Kelsey Hanna Medical record number: [id number] Date of birth: 17-Jul-1950 Age: 64 y.o. Gender: female  Primary Care Provider: Saralyn Pilar, MD Consultants: Psychiatry Code Status: DNR  Pt Overview and Major Events to Date:  8/18: Admitted for AMS, SIRS, severe hypernatremia, mania  Assessment and Plan: Kelsey Hanna is a 64 y.o. female presenting with altered mental status and fever after being found down in her apartment. PMH is significant for Bipolar Disorder, T2DM, Hypothyroidism, ?afib  # Sepsis with hypotension possibly 2/2 Coag neg Staph bacteremia: Improving sepsis picture. Ethanol, APAP, salicylate level, RPR  all negative . LP studies neg for meningitis. S/P zosyn - 8/17 BCx: Coag negative staph, waiting for sensitivities, BCx 8/19 NGTD  - Ucx insignificant growth 3,000 cfu, CSF Cx - NGTD - Continue Vanc x7d total (D6) per discussion with ID  # Hypernatremia: Resolved. Na 165 on admission, now 144, s/p  1/2NS and D5W. D/Ced with resolution  - Continue 300 ml free water TID   - Bmet daily   # Altered Mental status and bipolar- Manic state likely secondary to Bipolar manic state+ metabolic encephalopathy+ hypernatremia - mania state improving -Continue home Quetiapine, cymbalta, depakote as able to take PO - Psych consult, -->> will need inpatient psych after medically cleared - Consider Haldol, if patient becomes agigtated - consider soft restraints if agitation remains an issue  #Thrombocytopenia: Stable,  Has recently been on heparin ppx in 04/2014 when she had a similar drop in plts. Heparin d/c'd 8/19 for concern for HIT. HIT panel negative- Plts stable, 156 8/22 - Switch to Lovenox as HIT neg   # AKI: Resolved. Cr 0.56 - Continue to trending BMET   # Hypothyroidism, currently hyperthyroid. TSH .21, T4 .94. Potentially secondary to inappropriate dosing (med list  has both 143mcg and 169mcg doses, daughter unable to confirm her actual dose).  - Repeat TSH, free T4 again as outpatient - Continue synthroid 162mcg daily  #DM2- Last A1c 5.9 09/06/2014, stable - Continue SSI and CBG ACHS   FEN/GI:  Soft diet, 1/2NS @ 71mL/hr Prophylaxis: Lovenox  Disposition: Pending clinical improvement, dispo to inpatient psych. Anticipate ready for d/c after completion of 7d course of IV Vanc for bacteremia  Subjective:  Pt reports she feels, better. When asked orientation questions, pt became mildly agitated and stated " i'm not answering any of your stupid questions"  Objective: Temp:  [97.5 F (36.4 C)-98.2 F (36.8 C)] 97.9 F (36.6 C) (08/22 0400) Pulse Rate:  [86-94] 87 (08/22 0115) Resp:  [18-27] 27 (08/22 0400) BP: (113-131)/(53-69) 123/61 mmHg (08/22 0400) SpO2:  [99 %-100 %] 100 % (08/22 0400) Physical Exam: General: Sitting in bedside chair, no acute distress.  HEENT: Left eye clouding, right eye EOMI, MMM Cardiovascular: RRR no m/r/g Respiratory: CTAB, normal WOB no wheezes, rales or crackles.  Abdomen: soft, non tender, no organomegally palpated, + BS Skin:  no rashes noted Neuro: oriented to name and place, not to time or situation. Moving extremities  Psych: Pleasant and mildly cooperative but would not answer orientation questions, Follows some commands. Mild flight of ideas and pressured speech  Laboratory:  Recent Labs Lab 10/07/14 0512 10/08/14 0115 10/09/14 0302  WBC 7.1 7.3 7.9  HGB 11.6* 10.7* 10.4*  HCT 34.5* 31.2* 30.8*  PLT 142* 151 156    Recent Labs Lab 10/04/14 1018  10/07/14 0930 10/08/14 0115 10/09/14 0302  NA 165*  < >  145 146* 144  K 4.0  < > 3.6 3.2* 3.7  CL 127*  < > 114* 116* 114*  CO2 17*  < > 23 22 20*  BUN 75*  < > 13 11 9   CREATININE 3.07*  < > 0.71 0.62 0.56  CALCIUM 9.9  < > 8.7* 8.1* 8.1*  PROT 7.8  --   --   --   --   BILITOT 1.7*  --   --   --   --   ALKPHOS 63  --   --   --   --   ALT 54   --   --   --   --   AST 78*  --   --   --   --   GLUCOSE 118*  < > 98 111* 105*  < > = values in this interval not displayed.   Imaging/Diagnostic Tests: CT chest/abd/pelvis  IMPRESSION: No acute finding chest, abdomen or pelvis.  CT head  IMPRESSION: 1. Stable atrophy and white matter disease. 2. No acute intracranial abnormality. 3. Stable degenerative changes in the cervical spine. 4. No acute fracture or traumatic subluxation.   Veatrice Bourbon, MD 10/09/2014, 7:35 AM PGY-2, [place] Intern pager: [phone], text pages welcome

## 2014-10-09 NOTE — Progress Notes (Signed)
CSW spoke with pt ACT team case worker, Karsten Fells, he reports they have been working with the pt for a few weeks.   States that pt was recently put on correct medication for her needs and that they were trying to get the pt to be compliant with medication through "bubble packs" but pt was refusing- states that he believes pt was not taking her medication appropriately.  ACT team involvement is usually 2x a week but can vary depending on pt needs- ACT team plans to set up a family meeting to discuss potential longer term placement options for the pt since the pt is currently living independently and this does not seem to be a safe DC plan given current level of needs.    CSW will continue to follow if pt has additional needs- per psych note pt will need inpatient placement at time of DC- psych CSW will follow pt for inpatient psych placement needs.   Domenica Reamer, [place] Social Worker [phone]

## 2014-10-09 NOTE — Progress Notes (Signed)
Pt refused to check 22:00 p.m. CBG. Pt also refused to be connected with monitor and to wear pulse ox. Education given to pt.

## 2014-10-10 LAB — BASIC METABOLIC PANEL
Anion gap: 11 (ref 5–15)
BUN: 9 mg/dL (ref 6–20)
CHLORIDE: 112 mmol/L — AB (ref 101–111)
CO2: 20 mmol/L — AB (ref 22–32)
CREATININE: 0.75 mg/dL (ref 0.44–1.00)
Calcium: 8.8 mg/dL — ABNORMAL LOW (ref 8.9–10.3)
GFR calc non Af Amer: >60 mL/min (ref >60)
GLUCOSE: 111 mg/dL — AB (ref 65–99)
Potassium: 3.7 mmol/L (ref 3.5–5.1)
Sodium: 143 mmol/L (ref 135–145)

## 2014-10-10 MED ORDER — LORAZEPAM 2 MG/ML IJ SOLN
0.5000 mg | INTRAMUSCULAR | Status: DC | PRN
  Administered 2014-10-10 – 2014-10-12 (×3): 0.5 mg via INTRAVENOUS
  Filled 2014-10-10 (×3): qty 1

## 2014-10-10 NOTE — Clinical Social Work Psych Note (Signed)
Disposition: Inpatient Geriatric Psychiatric facility vs. Skilled Facility (unit CSW)  Psych CSW was consulted for possible inpatient geriatric psychiatric placement.  Patient's medications are being titrated and her mental status continues to improve.  In thorough conversations it becomes clear that patient remains slightly confused.  This may not be quite evident in brief conversations.  Unclear whether this is patient's baseline with dx of bipolar disorder or if this is patient's new baseline.  Psych CSW has left message for patient's daughter to complete full assessment and gather collateral information.  Patient is currently wanting to discharge home ; however is agreeable to placement. Psych CSW has requested the assistance of the psychiatrist to determine if patient has capacity to make her own living arrangements.  Psychiatrist agreeable to assist.  Psych CSW will review options with daughter.  Psych CSW awaiting a return call.  Psych CSW has sent clinical referral to:  [place]- referral sent has bed availability [facility]-at capacity [facility]- at capacity [facility]- at capacity- has one expected discharge and admission from their ED [facility]- at capacity- referral sent [facility]- at [place]- at [employer]- at [employer]- at [place] denied medical acuity [place]- at capacity- referral sent [facility]- has bed availability- [facility]- [facility] no anticipated discharges   Nonnie Done, [place] [phone]  [place] (5N 1-8) Clinical Social Worker

## 2014-10-10 NOTE — Progress Notes (Signed)
Family Medicine Teaching Service Daily Progress Note Intern Pager: [phone]  Patient name: Kelsey Hanna Medical record number: [id number] Date of birth: 23-Jun-1950 Age: 64 y.o. Gender: female  Primary Care Provider: Saralyn Pilar, MD Consultants: Psychiatry Code Status: DNR  Pt Overview and Major Events to Date:  8/18: Admitted for AMS, SIRS, severe hypernatremia, mania  Assessment and Plan: Kelsey Hanna is a 64 y.o. female presenting with altered mental status and fever after being found down in her apartment. PMH is significant for Bipolar Disorder, T2DM, Hypothyroidism, ?afib  # Sepsis with hypotension- resolved, + blood culture represtening contaminant versus transient bacteremia - 8/17 BCx: Coag negative staph, BCx 8/19 NGTD  - Ucx insignificant growth 3,000 cfu, CSF Cx - NGTD - Continue Vanc x7d total (D7) per discussion with ID  # Hypernatremia: Resolved. Na 165 on admission, now 143, s/p  1/2NS and D5W. D/Ced with resolution  - Continue 300 ml free water TID   - Bmet daily   # Altered Mental status and bipolar- Manic state likely secondary to Bipolar manic state+ metabolic encephalopathy+ hypernatremia - mania state improving -Continue home Quetiapine, cymbalta, depakote as able to take PO - Psych consult, -->> will need inpatient psych after medically cleared - Consider Haldol, if patient becomes agigtated - consider soft restraints if agitation remains an issue  #Thrombocytopenia: resolved, neg HIT panel - Switch to Lovenox as HIT neg   # AKI: Resolved. Cr 0.75 - Continue to trending BMET   # Hypothyroidism, currently hyperthyroid. TSH .21, T4 .94. Potentially secondary to inappropriate dosing (med list has both 154mcg and 182mcg doses, daughter unable to confirm her actual dose).  - Repeat TSH, free T4 again as outpatient - Continue synthroid 157mcg daily  #DM2- Last A1c 5.9 09/06/2014, stable - Continue SSI and CBG ACHS   FEN/GI:  Soft diet,  1/2NS @ 58mL/hr Prophylaxis: Lovenox  Disposition: Clinically stable for discharge to [facility]  Subjective:  Pt reports she feels, better. Oriented to person, place and time. Less agitated today  Objective: Temp:  [97.7 F (36.5 C)-98.1 F (36.7 C)] 98.1 F (36.7 C) (08/23 0540) Pulse Rate:  [94] 94 (08/22 0934) Resp:  [19-23] 23 (08/23 0000) BP: (122-139)/(72-81) 122/72 mmHg (08/23 0540) SpO2:  [98 %-99 %] 98 % (08/23 0540) Physical Exam: General: Sitting in bedside chair, no acute distress.  HEENT: Left eye clouding, right eye EOMI, MMM Cardiovascular: RRR no m/r/g Respiratory: CTAB, normal WOB no wheezes, rales or crackles.  Abdomen: soft, non tender, no organomegally palpated, + BS Skin:  no rashes noted Neuro: oriented to name and place, and time Psych: Pleasant and cooperative. Following commands today  Laboratory:  Recent Labs Lab 10/07/14 0512 10/08/14 0115 10/09/14 0302  WBC 7.1 7.3 7.9  HGB 11.6* 10.7* 10.4*  HCT 34.5* 31.2* 30.8*  PLT 142* 151 156    Recent Labs Lab 10/04/14 1018  10/08/14 0115 10/09/14 0302 10/10/14 0336  NA 165*  < > 146* 144 143  K 4.0  < > 3.2* 3.7 3.7  CL 127*  < > 116* 114* 112*  CO2 17*  < > 22 20* 20*  BUN 75*  < > 11 9 9   CREATININE 3.07*  < > 0.62 0.56 0.75  CALCIUM 9.9  < > 8.1* 8.1* 8.8*  PROT 7.8  --   --   --   --   BILITOT 1.7*  --   --   --   --   ALKPHOS 63  --   --   --   --  ALT 54  --   --   --   --   AST 78*  --   --   --   --   GLUCOSE 118*  < > 111* 105* 111*  < > = values in this interval not displayed.   Imaging/Diagnostic Tests: CT chest/abd/pelvis  IMPRESSION: No acute finding chest, abdomen or pelvis.  CT head  IMPRESSION: 1. Stable atrophy and white matter disease. 2. No acute intracranial abnormality. 3. Stable degenerative changes in the cervical spine. 4. No acute fracture or traumatic subluxation.   Veatrice Bourbon, MD 10/10/2014, 9:25 AM PGY-2, [place]  Intern pager: [phone], text pages welcome

## 2014-10-10 NOTE — Progress Notes (Signed)
Sent a text to MD that pt does not met the criteria for SDU. MD stated that she is going to be discharged to [facility].  Daughter, Sharyn Lull, is here and MD was made aware.

## 2014-10-10 NOTE — Care Management Important Message (Signed)
Important Message  Patient Details  Name: Kelsey Hanna MRN: [id number] Date of Birth: March 25, 1950   Medicare Important Message Given:  [facility] notification given    Girard Cooter, RN 10/10/2014, 2:57 PM

## 2014-10-10 NOTE — Progress Notes (Signed)
CSW informed that pt might be more appropriate for SNF if mentation continues to clear- CSW asked to look into SNF as an alternative DC plan if no psych placement can be found.  CSW initiated PASARR review and faxed referrals to local SNF  CSW will continue to follow.  Domenica Reamer, [place] Social Worker [phone]

## 2014-10-10 NOTE — Progress Notes (Signed)
Patient is non-compliant.  She refused fingerstick.  She is not on cardiac monitor. She is alert with occasional confusion.

## 2014-10-10 NOTE — Consult Note (Signed)
[facility] Face-to-Face Psychiatry Consult follow up  Reason for Consult:  Bipolar disorder and altered mental status Referring Physician:  Dr. Ardelia Mems Patient Identification: Kelsey Hanna MRN:  [id number] Principal Diagnosis: Altered mental status Diagnosis:   Patient Active Problem List   Diagnosis Date Noted  . Psychoses [F29]   . Bacteremia [R78.81]   . Sepsis with hypotension [A41.9] 10/04/2014  . Toxic metabolic encephalopathy [M54] 10/04/2014  . AKI (acute kidney injury) [N17.9] 10/04/2014  . Acute renal failure syndrome [N17.9]   . Altered mental status [R41.82]   . Hypernatremia [E87.0]   . Pedal edema [R60.0] 09/12/2014  . Bipolar disorder, current episode manic without psychotic features, severe [F31.13]   . Bipolar affective disorder, current episode manic without psychotic symptoms [F31.10] 09/05/2014  . Bipolar affective disorder, current episode manic with psychotic symptoms [F31.2] 09/05/2014  . Bipolar affective disorder, manic [F31.10] 09/03/2014  . Encounter for preadmission testing [Z01.818]   . Head revolving around [R42] 07/27/2014  . BP (high blood pressure) [I10] 05/23/2014  . AF (paroxysmal atrial fibrillation) [I48.0] 05/23/2014  . PNA (pneumonia) [J18.9] 04/27/2014  . Type 2 diabetes mellitus [E11.9] 04/27/2014  . Essential hypertension [I10] 04/27/2014  . Hypothyroidism [E03.9] 04/27/2014  . Tobacco abuse [Z72.0] 04/27/2014  . Bipolar I disorder, most recent episode (or current) unspecified [F31.9] 04/20/2014  . Acute encephalopathy [G93.40] 04/18/2014  . Unresponsiveness [R40.4]   . Acute respiratory failure [J96.00]   . Encounter for feeding tube placement [Z87.898]   . Rapid atrial fibrillation [I48.91]   . Shortness of breath [R06.02]   . Gout [M10.9] 08/25/2013  . 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 HLD (hyperlipidemia) [E78.5] 08/30/2009  . Apnea, sleep [G47.30] 04/05/2009    Total Time spent with patient: 30 minutes  Subjective:    Kelsey Hanna is a 64 y.o. female patient admitted with bipolar mania and altered mental status.  HPI: Kelsey Hanna is a 64 y.o. female admitted to [facility] with altered mental status and history of bipolar disorder. Patient has been living by herself and has been in good contact with her daughter. Patient presented with high fever with 104, tachycardiac, hypotensive with minimal responsiveness. Patient is awake but not alert and oriented during this evaluation. Patient is a poor historian. Review of electronic medical records indicated patient was recently admitted to [facility] for bipolar mania symptoms and discharged to outpatient care in July 2016 when she was stabilized on medication management. She has tangential, pressured speech and mild aggittation, occasionally puling at lines and disrobing. Her daughter, [name] is her mother's primary Scientist, research (medical). Her UDS was negative for narcotics. Patient cannot contract for safety and does not meet criteria for capacity to make her own medical decisions at this time.  HPI Elements:   Location:  Sepsis and bipolar mania. Quality:  Poor. Severity:  Unable to care for herself. Timing:  Altered mental status for unknown reason. Duration:  Less than 4 weeks. Context:  Psychosocial stressors..   Interval history: Patient seen face-to-face for psychiatric consultation follow-up today. Patient appeared awake, alert, oriented to time place person and situation. Patient endorses symptoms of depression, anxiety, mood swings and irritability. Patient initially presented with altered mental status and also bipolar manic symptoms. Patient was found to have infection in her lungs which was cleared with the current medication management. Patient mental status slowly improving and able to walk to the bathroom without assistance. Patient seems to be less confused  and more focused today and requesting to  be hospitalized for mental health for further assessment and crisis stabilization and safety monitoring. She is legally blind and able to manage alone at home with or without walker. Patient has capacity to make her own medical decisions and she is currently willing to participate inpatient psychiatric hospitalization voluntarily. Case discussed with the clinical psychiatric social service who is working on finding a placement for her.  Past Medical History:  Past Medical History  Diagnosis Date  . Bipolar 1 disorder   . Hypothyroid   . Dyslipidemia   . Retinal detachment   . Sleep apnea   . Diabetes mellitus     dx within last yr....just takes pills  . Cancer     cancer of uterus...no chemo or radiation    Past Surgical History  Procedure Laterality Date  . Abdominal hysterectomy    . Total abdominal hysterectomy w/ bilateral salpingoophorectomy    . Tonsillectomy    . Lumbar laminectomy/decompression microdiscectomy  07/17/2011    Procedure: LUMBAR LAMINECTOMY/DECOMPRESSION MICRODISCECTOMY;  Surgeon: Sinclair Ship, MD;  Location: [place];  Service: Orthopedics;  Laterality: Left;  Left sided lumbar 4-5 microdisectomy   Family History: No family history on file. Social History:  History  Alcohol Use No    Comment: socially     History  Drug Use No    Social History   Social History  . Marital Status: Divorced    Spouse Name: N/A  . Number of Children: N/A  . Years of Education: N/A   Social History Main Topics  . Smoking status: Current Every Day Smoker -- 0.25 packs/day for 35 years    Types: Cigarettes    Last Attempt to Quit: 07/09/1999  . Smokeless tobacco: None  . Alcohol Use: No     Comment: socially  . Drug Use: No  . Sexual Activity: Not Asked   Other Topics Concern  . None   Social History Narrative   Additional Social History:                          Allergies:   Allergies  Allergen Reactions  . Haldol [Haloperidol] Other (See  Comments)    Shaking of the hands, grinding her teeth  . Lithium Other (See Comments)    "makes me feel spaced out" slurred speech    Labs:  Results for orders placed or performed during the hospital encounter of 10/04/14 (from the past 48 hour(s))  Basic metabolic panel     Status: Abnormal   Collection Time: 10/09/14  3:02 AM  Result Value Ref Range   Sodium 144 135 - 145 mmol/L   Potassium 3.7 3.5 - 5.1 mmol/L   Chloride 114 (H) 101 - 111 mmol/L   CO2 20 (L) 22 - 32 mmol/L   Glucose, Bld 105 (H) 65 - 99 mg/dL   BUN 9 6 - 20 mg/dL   Creatinine, Ser 0.56 0.44 - 1.00 mg/dL   Calcium 8.1 (L) 8.9 - 10.3 mg/dL   GFR calc non Af Amer >60 >60 mL/min   GFR calc Af Amer >60 >60 mL/min    Comment: (NOTE) The eGFR has been calculated using the CKD EPI equation. This calculation has not been validated in all clinical situations. eGFR's persistently <60 mL/min signify possible Chronic Kidney Disease.    Anion gap 10 5 - 15  CBC     Status: Abnormal   Collection Time: 10/09/14  3:02 AM  Result Value Ref Range   WBC 7.9 4.0 - 10.5 K/uL   RBC 3.38 (L) 3.87 - 5.11 MIL/uL   Hemoglobin 10.4 (L) 12.0 - 15.0 g/dL   HCT 30.8 (L) 36.0 - 46.0 %   MCV 91.1 78.0 - 100.0 fL   MCH 30.8 26.0 - 34.0 pg   MCHC 33.8 30.0 - 36.0 g/dL   RDW 14.0 11.5 - 15.5 %   Platelets 156 150 - 400 K/uL  Basic metabolic panel     Status: Abnormal   Collection Time: 10/10/14  3:36 AM  Result Value Ref Range   Sodium 143 135 - 145 mmol/L   Potassium 3.7 3.5 - 5.1 mmol/L   Chloride 112 (H) 101 - 111 mmol/L   CO2 20 (L) 22 - 32 mmol/L   Glucose, Bld 111 (H) 65 - 99 mg/dL   BUN 9 6 - 20 mg/dL   Creatinine, Ser 0.75 0.44 - 1.00 mg/dL   Calcium 8.8 (L) 8.9 - 10.3 mg/dL   GFR calc non Af Amer >60 >60 mL/min   GFR calc Af Amer >60 >60 mL/min    Comment: (NOTE) The eGFR has been calculated using the CKD EPI equation. This calculation has not been validated in all clinical situations. eGFR's persistently <60 mL/min  signify possible Chronic Kidney Disease.    Anion gap 11 5 - 15    Vitals: Blood pressure 122/72, pulse 94, temperature 98.1 F (36.7 C), temperature source Oral, resp. rate 23, height $RemoveBe'5\' 4"'PLFJiVFHM$  (1.626 m), weight 64.864 kg (143 lb), SpO2 98 %.  Risk to Self: Is patient at risk for suicide?: No Risk to Others:   Prior Inpatient Therapy:   Prior Outpatient Therapy:    Current Facility-Administered Medications  Medication Dose Route Frequency Provider Last Rate Last Dose  . acetaminophen (TYLENOL) tablet 650 mg  650 mg Oral Q6H PRN Leone Brand, MD   650 mg at 10/10/14 0546   Or  . acetaminophen (TYLENOL) suppository 650 mg  650 mg Rectal Q6H PRN Leone Brand, MD      . antiseptic oral rinse (CPC / CETYLPYRIDINIUM CHLORIDE 0.05%) solution 7 mL  7 mL Mouth Rinse BID Leeanne Rio, MD   7 mL at 10/10/14 1000  . divalproex (DEPAKOTE ER) 24 hr tablet 500 mg  500 mg Oral Daily Veatrice Bourbon, MD   500 mg at 10/10/14 1014  . DULoxetine (CYMBALTA) DR capsule 30 mg  30 mg Oral Daily Veatrice Bourbon, MD   30 mg at 10/10/14 1014  . enoxaparin (LOVENOX) injection 40 mg  40 mg Subcutaneous Q24H Durwin Nora, RPH   40 mg at 10/09/14 1007  . feeding supplement (BOOST / RESOURCE BREEZE) liquid 1 Container  1 Container Oral TID BM Rogue Bussing, RD   1 Container at 10/10/14 1014  . free water 300 mL  300 mL Oral 3 times per day Virginia Crews, MD   300 mL at 10/09/14 2200  . insulin aspart (novoLOG) injection 0-5 Units  0-5 Units Subcutaneous QHS Leone Brand, MD   0 Units at 10/04/14 2200  . insulin aspart (novoLOG) injection 0-9 Units  0-9 Units Subcutaneous TID WC Leone Brand, MD   2 Units at 10/05/14 1727  . levothyroxine (SYNTHROID, LEVOTHROID) tablet 137 mcg  137 mcg Oral QAC breakfast Veatrice Bourbon, MD   137 mcg at 10/10/14 0636  . LORazepam (ATIVAN) injection 0.5 mg  0.5 mg Intravenous  Q4H PRN Veatrice Bourbon, MD      . ondansetron (ZOFRAN) tablet 4 mg  4 mg Oral Q6H PRN  Leone Brand, MD       Or  . ondansetron [facility]) injection 4 mg  4 mg Intravenous Q6H PRN Leone Brand, MD      . QUEtiapine (SEROQUEL) tablet 100 mg  100 mg Oral TID Ambrose Finland, MD   100 mg at 10/10/14 1014  . sodium chloride 0.9 % injection 3 mL  3 mL Intravenous Q12H Leone Brand, MD   3 mL at 10/10/14 1015  . vancomycin (VANCOCIN) IVPB 750 mg/150 ml premix  750 mg Intravenous Q12H Veatrice Bourbon, MD   750 mg at 10/10/14 0545    Musculoskeletal: Strength & Muscle Tone: increased Gait & Station: unable to stand Patient leans: N/A  Psychiatric Specialty Exam: Physical Exam   ROS   Blood pressure 122/72, pulse 94, temperature 98.1 F (36.7 C), temperature source Oral, resp. rate 23, height $RemoveBe'5\' 4"'VWAQGsvjW$  (1.626 m), weight 64.864 kg (143 lb), SpO2 98 %.Body mass index is 24.53 kg/(m^2).  General Appearance: Bizarre, Disheveled and Guarded  Eye Contact::  Minimal  Speech:  Blocked and Garbled  Volume:  Decreased  Mood:  Anxious and Euphoric  Affect:  Labile  Thought Process:  Disorganized  Orientation:  Negative  Thought Content:  Rumination  Suicidal Thoughts:  No  Homicidal Thoughts:  No  Memory:  Immediate;   Fair Recent;   Poor  Judgement:  Impaired  Insight:  Lacking  Psychomotor Activity:  Restlessness  Concentration:  Poor  Recall:  Poor  Fund of Knowledge:Poor  Language: Fair  Akathisia:  Negative  Handed:  Right  AIMS (if indicated):     Assets:  Catering manager Housing Leisure Time Resilience Social Support Transportation  ADL's:  Impaired  Cognition: Impaired,  Moderate  Sleep:      Medical Decision Making: Review of Psycho-Social Stressors (1), Review or order clinical lab tests (1), Established Problem, Worsening (2), Review of Last Therapy Session (1), Review or order medicine tests (1), Review of Medication Regimen & Side Effects (2) and Review of New Medication or Change in Dosage (2)  Treatment Plan Summary: Patient has  bipolar mania and altered mental status secondary to sepsis and unable to care for herself. Daily contact with patient to assess and evaluate symptoms and progress in treatment and Medication management  Plan:  Patient has capacity to make her own medical decisions and living arrangement based on my evaluation today Patient is willing to transfer to the geriatric psychiatric facility for further evaluation and treatment needs Continue Depakote ER 500 mg PO Qam for mood swings - monitor of valproice acid level Continue Cymbalta 30 mg Qam for depression Continue Seroquel 100 mg PO TID for mood swings Recommend psychiatric Inpatient admission when medically cleared. Supportive therapy provided about ongoing stressors. Appreciate psychiatric consultation and we sign off at this time Please contact [phone] or [phone] if needs further assistance  Disposition: Admit to acute psychiatric hospitalization when medically stable  Dasiah Hooley,JANARDHAHA R. 10/10/2014 12:14 PM

## 2014-10-11 LAB — CULTURE, BLOOD (ROUTINE X 2)
CULTURE: NO GROWTH
CULTURE: NO GROWTH

## 2014-10-11 LAB — BASIC METABOLIC PANEL
Anion gap: 9 (ref 5–15)
BUN: 9 mg/dL (ref 6–20)
CHLORIDE: 110 mmol/L (ref 101–111)
CO2: 22 mmol/L (ref 22–32)
Calcium: 8.4 mg/dL — ABNORMAL LOW (ref 8.9–10.3)
Creatinine, Ser: 0.66 mg/dL (ref 0.44–1.00)
GFR calc non Af Amer: >60 mL/min (ref >60)
Glucose, Bld: 111 mg/dL — ABNORMAL HIGH (ref 65–99)
POTASSIUM: 3.3 mmol/L — AB (ref 3.5–5.1)
SODIUM: 141 mmol/L (ref 135–145)

## 2014-10-11 MED ORDER — POTASSIUM CHLORIDE CRYS ER 20 MEQ PO TBCR
40.0000 meq | EXTENDED_RELEASE_TABLET | Freq: Once | ORAL | Status: AC
  Administered 2014-10-11: 40 meq via ORAL
  Filled 2014-10-11: qty 2

## 2014-10-11 MED ORDER — METFORMIN HCL 500 MG PO TABS: 500.0000 mg | ORAL_TABLET | Freq: Every day | ORAL | Status: DC

## 2014-10-11 MED ORDER — METFORMIN HCL 500 MG PO TABS
1000.0000 mg | ORAL_TABLET | Freq: Every day | ORAL | Status: DC
  Administered 2014-10-12 – 2014-10-13 (×2): 1000 mg via ORAL
  Filled 2014-10-11 (×2): qty 2

## 2014-10-11 NOTE — Progress Notes (Addendum)
CSW faxed requested clinicals to PASARR review board- PASARR number approval pending  CSW called pt daughter, Sharyn Lull, and gave bed offers- Sharyn Lull to research facilities online and inform CSW of decision.  Psych still involved- behavioral health placement still the current recommendation.  CSW will continue to follow.  Domenica Reamer, [place] Social Worker [phone]

## 2014-10-11 NOTE — Progress Notes (Signed)
Family Medicine Teaching Service Daily Progress Note Intern Pager: [phone]  Patient name: Kelsey Hanna Medical record number: [id number] Date of birth: 1950/06/21 Age: 64 y.o. Gender: female  Primary Care Provider: Saralyn Pilar, MD Consultants: Psychiatry Code Status: DNR  Pt Overview and Major Events to Date:  8/18: Admitted for AMS, SIRS, severe hypernatremia, mania  Assessment and Plan: Kelsey Hanna is a 64 y.o. female presenting with altered mental status and fever after being found down in her apartment. PMH is significant for Bipolar Disorder, T2DM, Hypothyroidism, ?afib  # Sepsis with hypotension- resolved, + blood culture represtening contaminant versus transient bacteremia - 8/17 BCx: Coag negative staph, BCx 8/19 NGTD  - Ucx insignificant growth 3,000 cfu, CSF Cx - NGTD - s/p Vanc x7d total per discussion with ID  # Hypernatremia: Resolved. Na 165 on admission, now 143, s/p  1/2NS and D5W. D/Ced with resolution  - Continue 300 ml free water TID   - Bmet daily   # Hypokalemia K 3.3 - Replete with Kdur 26mEq x1  # Altered Mental status and bipolar- Manic state likely secondary to Bipolar manic state+ metabolic encephalopathy+ hypernatremia - mania state improving, but still intermittently aggitated -Continue home Quetiapine, cymbalta, depakote as able to take PO - Psych consult, -->> will need inpatient psych after medically cleared - Consider Haldol, if patient becomes agigtated - consider soft restraints if agitation remains an issue  #Thrombocytopenia: resolved, neg HIT panel - Switch to Lovenox as HIT neg   # AKI: Resolved. Cr 0.66 - Continue to trending BMET   # Hypothyroidism, currently hyperthyroid. TSH .21, T4 .94. Potentially secondary to inappropriate dosing (med list has both 155mcg and 153mcg doses, daughter unable to confirm her actual dose).  - Repeat TSH, free T4 again as outpatient - Continue synthroid 140mcg daily  #DM2- Last A1c  5.9 09/06/2014, stable - Continue SSI and CBG ACHS   FEN/GI:  Soft diet, 1/2NS @ 68mL/hr Prophylaxis: Lovenox  Disposition: Clinically stable, ready for discharge to [facility], will transfer to floor with sitter while awaiting final dispo  Subjective:  Pt states she feels better and is ready to go home  Objective: Temp:  [98.3 F (36.8 C)-99 F (37.2 C)] 99 F (37.2 C) (08/24 0817) Physical Exam: General: Sitting in bedside chair, no acute distress.  HEENT: Left eye clouding, right eye EOMI, MMM Cardiovascular: RRR no m/r/g Respiratory: CTAB, normal WOB no wheezes, rales or crackles.  Abdomen: soft, non tender, no organomegally palpated, + BS Skin:  no rashes noted Neuro: oriented to name and place, and time Psych: Pleasant and cooperative. Following commands today  Laboratory:  Recent Labs Lab 10/07/14 0512 10/08/14 0115 10/09/14 0302  WBC 7.1 7.3 7.9  HGB 11.6* 10.7* 10.4*  HCT 34.5* 31.2* 30.8*  PLT 142* 151 156    Recent Labs Lab 10/04/14 1018  10/09/14 0302 10/10/14 0336 10/11/14 0247  NA 165*  < > 144 143 141  K 4.0  < > 3.7 3.7 3.3*  CL 127*  < > 114* 112* 110  CO2 17*  < > 20* 20* 22  BUN 75*  < > 9 9 9   CREATININE 3.07*  < > 0.56 0.75 0.66  CALCIUM 9.9  < > 8.1* 8.8* 8.4*  PROT 7.8  --   --   --   --   BILITOT 1.7*  --   --   --   --   ALKPHOS 63  --   --   --   --  ALT 54  --   --   --   --   AST 78*  --   --   --   --   GLUCOSE 118*  < > 105* 111* 111*  < > = values in this interval not displayed.   Imaging/Diagnostic Tests: CT chest/abd/pelvis  IMPRESSION: No acute finding chest, abdomen or pelvis.  CT head  IMPRESSION: 1. Stable atrophy and white matter disease. 2. No acute intracranial abnormality. 3. Stable degenerative changes in the cervical spine. 4. No acute fracture or traumatic subluxation.   Veatrice Bourbon, MD 10/11/2014, 9:18 AM PGY-2, [place] Intern pager: [phone], text pages welcome

## 2014-10-11 NOTE — Progress Notes (Signed)
Nutrition Follow-up  DOCUMENTATION CODES:   Obesity unspecified  INTERVENTION:   -D/c Boost Breeze TID, due to increased PO intake  NUTRITION DIAGNOSIS:   Inadequate oral intake related to lethargy/confusion as evidenced by meal completion < 25%.  Progressing  GOAL:   Patient will meet greater than or equal to 90% of their needs  Met  MONITOR:   PO intake, Supplement acceptance, Labs, Weight trends  REASON FOR ASSESSMENT:   Malnutrition Screening Tool    ASSESSMENT:   64 y.o. Female presenting with altered mental status and fever after being found down in her apartment. PMH is significant for Bipolar Disorder, T2DM, Hypothyroidism, ?afib  Pt transferred from SDU to medical floor today.   Chart reviewed. Intake has improved since last week. Pt is on a regular diet; PO: 45-100%.   CSW and psychiatry following. Pt to be transferred to inpatient psych facility or SNF once bed is available.   Labs reviewed. K: 3.3.  Diet Order:  DIET SOFT Room service appropriate?: Yes; Fluid consistency:: Thin  Skin:  Reviewed, no issues  Last BM:  10/09/14  Height:   Ht Readings from Last 1 Encounters:  10/11/14 $RemoveB'5\' 3"'CNCtlhAk$  (1.6 m)    Weight:   Wt Readings from Last 1 Encounters:  10/11/14 163 lb (73.936 kg)    Ideal Body Weight:  54.5 kg  BMI:  Body mass index is 28.88 kg/(m^2).  Estimated Nutritional Needs:   Kcal:  1600-1800  Protein:  80-90 gm  Fluid:  1.6-1.8 L  EDUCATION NEEDS:   No education needs identified at this time  Guhan Bruington A. Jimmye Norman, RD, LDN, CDE Pager: [phone] After hours Pager: [phone]

## 2014-10-11 NOTE — Clinical Social Work Placement (Signed)
   CLINICAL SOCIAL WORK PLACEMENT  NOTE  Date:  10/11/2014  Patient Details  Name: Kelsey Hanna MRN: [id number] Date of Birth: [birth year]  Clinical Social Work is seeking post-discharge placement for this patient at the [place] level of care (*CSW will initial, date and re-position this form in  chart as items are completed):  Yes   Patient/family provided with [facility] Work Department's list of facilities offering this level of care within the geographic area requested by the patient (or if unable, by the patient's family).  Yes   Patient/family informed of their freedom to choose among providers that offer the needed level of care, that participate in Medicare, Medicaid or managed care program needed by the patient, have an available bed and are willing to accept the patient.  Yes   Patient/family informed of [facility]'s ownership interest in [facility] and [facility], as well as of the fact that they are under no obligation to receive care at these facilities.  PASRR submitted to EDS on 10/10/14     PASRR number received on       Existing PASRR number confirmed on       FL2 transmitted to all facilities in geographic area requested by pt/family on 10/10/14     FL2 transmitted to all facilities within larger geographic area on       Patient informed that his/her managed care company has contracts with or will negotiate with certain facilities, including the following:        Yes   Patient/family informed of bed offers received.  Patient chooses bed at       Physician recommends and patient chooses bed at      Patient to be transferred to   on  .  Patient to be transferred to facility by       Patient family notified on   of transfer.  Name of family member notified:        PHYSICIAN Please sign FL2, Please sign DNR     Additional Comment:    _______________________________________________ Cranford Mon,  LCSW 10/11/2014, 10:59 AM

## 2014-10-11 NOTE — Progress Notes (Signed)
Sitter was dismissed because pt was agreeable to bed alarm, however pt seems to be turning off the bed alarm herself when nurse is not present, and has been found getting up unattended twice. Staffing has been notified of need for sitter for safety due to impulsive behaviors.

## 2014-10-11 NOTE — Clinical Social Work Psych Note (Addendum)
Psych CSW received notification from [facility] that they are currently at capacity and not reviewing patients at this time.  Psych CSW has also followed up with [facility] a call back to determine if patient has been reviewed for possible admission.  Currently these are the only two facilities that have gero-psych availability.  Disposition: Inpatient gero-psych vs. SNF daughter and patient agreeable to either as a safe discharge  Nonnie Done, LCSW [phone]  Psychiatric & Orthopedics (5N 1-8) Clinical Social Worker

## 2014-10-11 NOTE — Clinical Social Work Note (Addendum)
Clinical Social Work Assessment  Patient Details  Name: Kelsey Hanna MRN: [id number] Date of Birth: 03-22-1950  Date of referral:  10/11/14               Reason for consult:  Facility Placement                Permission sought to share information with:  Facility Sport and exercise psychologist, Family Supports, Case Manager Permission granted to share information::     Name::     Sharyn Lull, Mr. Nyoka Cowden  Agency::  local SNF  Relationship::  daughter, ACT team case manager  Contact Information:     Housing/Transportation Living arrangements for the past 2 months:  Apartment Doctor, hospital) Source of Information:  Adult Children Patient Interpreter Needed:  None Criminal Activity/Legal Involvement Pertinent to Current Situation/Hospitalization:  No - Comment as needed Significant Relationships:  Adult Children Lives with:  Self Do you feel safe going back to the place where you live?  Yes (pt states she feels safe to return- medical team would prefer placement) Need for family participation in patient care:  Yes (Comment)  Care giving concerns:  Pt lives alone in senior living community, Scientist, research (life sciences), has no one to be with her at home and is noncompliant with meds   Social Worker assessment / plan: CSW spoke with pt daughter about DC disposition- [facility] stay vs SNF vs home health  Employment status:  Retired Forensic scientist:    PT Recommendations:  Not assessed at this time Information / Referral to community resources:  [place]  Patient/Family's Response to care:  Pt daughter is agreeable to pt transitioning from hospital to another facility- does not feel safe with pt returning home given current state.  Pt daughter would like CSW to be in contact with pt ACT worker, Mr. Nyoka Cowden, to inform of pt hospital course and to help coordinate care.  CSW spoke with Mr. Nyoka Cowden who reported pt non compliance with medication management and he expressed his concerns for pt return to  independent living.  Currently pt daughter goal is for pt to return to her apartment when appropriate.  Patient/Family's Understanding of and Emotional Response to Diagnosis, Current Treatment, and Prognosis:  Pt daughter seems to have good understanding- has been caregiver through pt chronic mental health issues  Emotional Assessment Appearance:    Attitude/Demeanor/Rapport:  Irrational Affect (typically observed):    Orientation:  Oriented to Self, Oriented to Place Alcohol / Substance use:  Not Applicable Psych involvement (Current and /or in the community):  Yes (Comment) (recommending inpatient psych stay)  Discharge Needs  Concerns to be addressed:  Cognitive Concerns, Home Safety Concerns, Decision making concerns, Mental Health Concerns Readmission within the last 30 days:  Yes Current discharge risk:  Lives alone Barriers to Discharge:  Continued Medical Work up   [employer], LCSW 10/11/2014, 10:55 AM

## 2014-10-11 NOTE — Progress Notes (Signed)
Kelsey Hanna, patient's daughter is notified of this transfer to [address].  Report given to Evelena Peat, Therapist, sports.

## 2014-10-11 NOTE — Evaluation (Signed)
Physical Therapy Evaluation Patient Details Name: Kelsey Hanna MRN: [id number] DOB: 21-Jan-1951 Today's Date: 10/11/2014   History of Present Illness  64 y.o. female presenting with fever and altered mental status after being found down in her apartment. Per her daughter, she left the door to her apartment open and a neighbor found her lying in her own feces and urine and called EMS. On presentation to the ED, she was noted to be febrile, tachycardic, and hypotensive with minimal responsiveness. She became more responsive with fluid hydration. She was then noted to have tangential, pressured speech and mild aggittation, occasionally puling at lines and disrobing. PMH is significant for Bipolar Disorder, T2DM, Hypothyroidism, ?afib. Manic state likely secondary to Bipolar manic state + metabolic encephalopathy + hypernatremia.  Clinical Impression  Patient was found washing her hands and face at the sink upon PT arrival. During the session, she was generally very pleasant but had one episode of moderate agitation when initially accepting PT aid in dressing. She changes her story frequently as to where she was before the hospital and especially where she wants to/is going after being discharged. 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 Patient was able to ambulate and transfer as described below. At this time, patient has no further PT needs as she is independent with all mobility and can handle high level balance activities as well as navigate a room. Thank you.    Follow Up Recommendations No PT follow up;Supervision - Intermittent    Equipment Recommendations  None recommended by PT    Recommendations for Other Services       Precautions / Restrictions Precautions Precautions: Other (comment) (Blind in left eye, some vision in R.) Restrictions Weight Bearing Restrictions: No      Mobility  Bed Mobility Overal bed mobility: Independent              General bed mobility comments: Patient standing at sink upon PT arrival.   Transfers Overall transfer level: Independent Equipment used: None             General transfer comment: Patient able to stand independently.  Ambulation/Gait Ambulation/Gait assistance: Independent Ambulation Distance (Feet): 200 Feet Assistive device: None Gait Pattern/deviations: Shuffle;Narrow base of support   Gait velocity interpretation: at or above normal speed for age/gender General Gait Details: Patient able to walk, change speeds, change directions, perform head shakes, and dance without any unsteadiness or assistance.  Stairs            Wheelchair Mobility    Modified Rankin (Stroke Patients Only)       Balance Overall balance assessment: Independent                                           Pertinent Vitals/Pain Pain Assessment: No/denies pain    Home Living Family/patient expects to be discharged to:: Group home                 Additional Comments: Patient repeatedly changes story about where she lives or is expecting to live after discharge from hospital. At first she claims to be going back home but with the help of a sitter, but at the end of the session was saying how she only wants to be in a group home. Spoke with Case Manager/RN about discharge plans - patient is going  to behavioral health home.   Prior Function Level of Independence: Independent               Hand Dominance        Extremity/Trunk Assessment               Lower Extremity Assessment: Overall WFL for tasks assessed         Communication   Communication: No difficulties  Cognition Arousal/Alertness: Awake/alert Behavior During Therapy: Impulsive Overall Cognitive Status: History of cognitive impairments - at baseline Area of Impairment: Memory;Safety/judgement         Safety/Judgement: Decreased awareness of safety     General Comments: Patient  is an interesting historian - has claimed that she wants to go to school to be an Clinical biochemist, that she dances as Elvis at the home she lives in, and has intermittent mood swings.    General Comments      Exercises        Assessment/Plan    PT Assessment [place] does not need any further PT services  PT Diagnosis Abnormality of gait;Altered mental status   PT Problem List    PT Treatment Interventions     PT Goals (Current goals can be found in the Care Plan section) Acute Rehab PT Goals Patient Stated Goal: Go to electrician school, go to the grocery store. PT Goal Formulation: With patient Time For Goal Achievement: 10/18/14 Potential to Achieve Goals: Good    Frequency     Barriers to discharge        Co-evaluation               End of Session   Activity Tolerance: Patient tolerated treatment well Patient left: in bed;with call bell/phone within reach Nurse Communication: Mobility status         Time: 1610-9604 PT Time Calculation (min) (ACUTE ONLY): 24 min   Charges:   PT Evaluation $Initial PT Evaluation Tier I: 1 Procedure     PT G CodesRoanna Epley, SPT [phone] 10/11/2014, 10:46 AM  I have read, reviewed and agree with student's note.   [facility] [phone] (pager)

## 2014-10-12 LAB — BASIC METABOLIC PANEL
Anion gap: 8 (ref 5–15)
BUN: 10 mg/dL (ref 6–20)
CO2: 26 mmol/L (ref 22–32)
Calcium: 9 mg/dL (ref 8.9–10.3)
Chloride: 110 mmol/L (ref 101–111)
Creatinine, Ser: 0.51 mg/dL (ref 0.44–1.00)
GFR calc Af Amer: >60 mL/min (ref >60)
Glucose, Bld: 92 mg/dL (ref 65–99)
POTASSIUM: 4.3 mmol/L (ref 3.5–5.1)
SODIUM: 144 mmol/L (ref 135–145)

## 2014-10-12 MED ORDER — LORAZEPAM 0.5 MG PO TABS
0.5000 mg | ORAL_TABLET | Freq: Two times a day (BID) | ORAL | Status: DC | PRN
  Administered 2014-10-12 – 2014-10-13 (×2): 0.5 mg via ORAL
  Filled 2014-10-12 (×2): qty 1

## 2014-10-12 NOTE — Progress Notes (Addendum)
Kelsey Hanna, Kelsey Hanna, ACTT team Director will not be visiting the patient today.  Director states she was under the impression the patient was not projected to discharge until 8/28 due to a course of IV abx.  Psych CSW spoke with MD to confirm this course has been completed.  ACTT team Director was going to pay a courtesy visit to patient.  Since patient is discharging today, Director states the visit is not necessary.  ACTT team will follow-up within the home on Friday 10/12/2014.  Kelsey Done, LCSW [phone]  Psychiatric & Orthopedics (5N 1-8) Clinical Social Worker     Kelsey Hanna documentation 10/12/2014 10:41am- PT note saying pt is independent in all mobility- pt is not SNF appropriate at this time- has no other skillable needs.  CSW received call from ACT worker Kelsey Hanna- he wanted to express that he did not feel as if patient makes appropriate decisions for herself and that she often seems competent but will continue to make poor decisions.  Mr. Kelsey Hanna stated that ACT team lead, Kelsey Hanna, will be coming to the hospital today to meet with patient and help with care plan.  CSW left message for psych social worker to inform.  unit CSW will sign off for now, patient will continue to be followed by psych social worker.  Kelsey Hanna, [place] Social Worker [phone]

## 2014-10-12 NOTE — Progress Notes (Signed)
Pt. Refused vitals signs and blood sugar checked, up adlib with assist to the bathroom and sometimes can be abrupt and start getting up without calling. Always remind her to call anytime for her safety but patient continue to be noncompliant. Will continue to observe and monitor patient, bed alarm on.

## 2014-10-12 NOTE — Care Management Note (Addendum)
Case Management Note  Patient Details  Name: Kelsey Hanna MRN: [id number] Date of Birth: 12/05/1950  Subjective/Objective:     61 CSW following patient for inpatient pysch bed, patient possibly may be cleared , but CSW will check with Pysch MD. Patient will be dc to home per pysch CSW note.               Action/Plan:   Expected Discharge Date:                  [facility] Dis[facility] Plan:  [facility]  In-House Referral:  Clinical Social Work  Discharge planning Services     Post Acute Care Choice:    Choice offered to:     DME Arranged:    DME Agency:     HH Arranged:    [place] Agency:     Status of Service:  Completed, signed off  Medicare Important Message Given:  Yes-second notification given Date Medicare IM Given:    Medicare IM give by:    Date Additional Medicare IM Given:    Additional Medicare Important Message give by:     If discussed at [place] of Stay Meetings, dates discussed:    Additional Comments:  Zenon Mayo, RN 10/12/2014, 11:44 AM

## 2014-10-12 NOTE — Consult Note (Signed)
[facility] Face-to-Face Psychiatry Consult follow up  Reason for Consult:  Bipolar disorder and altered mental status Referring Physician:  Dr. Ardelia Mems Patient Identification: Kelsey Hanna MRN:  [id number] Principal Diagnosis: Altered mental status Diagnosis:   Patient Active Problem List   Diagnosis Date Noted  . Psychoses [F29]   . Bacteremia [R78.81]   . Sepsis with hypotension [A41.9] 10/04/2014  . Toxic metabolic encephalopathy [M54] 10/04/2014  . AKI (acute kidney injury) [N17.9] 10/04/2014  . Acute renal failure syndrome [N17.9]   . Altered mental status [R41.82]   . Hypernatremia [E87.0]   . Pedal edema [R60.0] 09/12/2014  . Bipolar disorder, current episode manic without psychotic features, severe [F31.13]   . Bipolar affective disorder, current episode manic without psychotic symptoms [F31.10] 09/05/2014  . Bipolar affective disorder, current episode manic with psychotic symptoms [F31.2] 09/05/2014  . Bipolar affective disorder, manic [F31.10] 09/03/2014  . Encounter for preadmission testing [Z01.818]   . Head revolving around [R42] 07/27/2014  . BP (high blood pressure) [I10] 05/23/2014  . AF (paroxysmal atrial fibrillation) [I48.0] 05/23/2014  . PNA (pneumonia) [J18.9] 04/27/2014  . Type 2 diabetes mellitus [E11.9] 04/27/2014  . Essential hypertension [I10] 04/27/2014  . Hypothyroidism [E03.9] 04/27/2014  . Tobacco abuse [Z72.0] 04/27/2014  . Bipolar I disorder, most recent episode (or current) unspecified [F31.9] 04/20/2014  . Acute encephalopathy [G93.40] 04/18/2014  . Unresponsiveness [R40.4]   . Acute respiratory failure [J96.00]   . Encounter for feeding tube placement [Z87.898]   . Rapid atrial fibrillation [I48.91]   . Shortness of breath [R06.02]   . Gout [M10.9] 08/25/2013  . 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 HLD (hyperlipidemia) [E78.5] 08/30/2009  . Apnea, sleep [G47.30] 04/05/2009    Total Time spent with patient: 30 minutes  Subjective:    Kelsey Hanna is a 64 y.o. female patient admitted with bipolar mania and altered mental status.  HPI: KANON Hanna is a 64 y.o. female admitted to [facility] with altered mental status and history of bipolar disorder. Patient has been living by herself and has been in good contact with her daughter. Patient presented with high fever with 104, tachycardiac, hypotensive with minimal responsiveness. Patient is awake but not alert and oriented during this evaluation. Patient is a poor historian. Review of electronic medical records indicated patient was recently admitted to [facility] for bipolar mania symptoms and discharged to outpatient care in July 2016 when she was stabilized on medication management. She has tangential, pressured speech and mild aggittation, occasionally puling at lines and disrobing. Her daughter, [name] is her mother's primary Scientist, research (medical). Her UDS was negative for narcotics. Patient cannot contract for safety and does not meet criteria for capacity to make her own medical decisions at this time.  HPI Elements:   Location:  Sepsis and bipolar mania. Quality:  Poor. Severity:  Unable to care for herself. Timing:  Altered mental status for unknown reason. Duration:  Less than 4 weeks. Context:  Psychosocial stressors..   Interval history: Patient seen face-to-face for psychiatric consultation follow-up today. Patient appeared awake, alert, oriented to time place person and situation. Patient endorses symptoms of depression, anxiety, mood swings and irritability. Patient initially presented with altered mental status and also bipolar manic symptoms. Patient was found to have infection in her lungs which was cleared with the current medication management. Patient mental status slowly improving and able to walk to the bathroom without assistance. Patient seems to be less confused  and more focused today and requesting to  be hospitalized for mental health for further assessment and crisis stabilization and safety monitoring. She is legally blind and able to manage alone at home with or without walker. Patient has capacity to make her own medical decisions and she is currently willing to participate inpatient psychiatric hospitalization voluntarily. Case discussed with the clinical psychiatric social service who is working on finding a placement for her.  Past Medical History:  Past Medical History  Diagnosis Date  . Bipolar 1 disorder   . Hypothyroid   . Dyslipidemia   . Retinal detachment   . Sleep apnea   . Diabetes mellitus     dx within last yr....just takes pills  . Cancer     cancer of uterus...no chemo or radiation    Past Surgical History  Procedure Laterality Date  . Abdominal hysterectomy    . Total abdominal hysterectomy w/ bilateral salpingoophorectomy    . Tonsillectomy    . Lumbar laminectomy/decompression microdiscectomy  07/17/2011    Procedure: LUMBAR LAMINECTOMY/DECOMPRESSION MICRODISCECTOMY;  Surgeon: Sinclair Ship, MD;  Location: [place];  Service: Orthopedics;  Laterality: Left;  Left sided lumbar 4-5 microdisectomy   Family History: No family history on file. Social History:  History  Alcohol Use No    Comment: socially     History  Drug Use No    Social History   Social History  . Marital Status: Divorced    Spouse Name: N/A  . Number of Children: N/A  . Years of Education: N/A   Social History Main Topics  . Smoking status: Current Every Day Smoker -- 0.25 packs/day for 35 years    Types: Cigarettes    Last Attempt to Quit: 07/09/1999  . Smokeless tobacco: None  . Alcohol Use: No     Comment: socially  . Drug Use: No  . Sexual Activity: Not Asked   Other Topics Concern  . None   Social History Narrative   Additional Social History:                          Allergies:   Allergies  Allergen Reactions  . Haldol [Haloperidol] Other (See  Comments)    Shaking of the hands, grinding her teeth  . Lithium Other (See Comments)    "makes me feel spaced out" slurred speech    Labs:  Results for orders placed or performed during the hospital encounter of 10/04/14 (from the past 48 hour(s))  Basic metabolic panel     Status: Abnormal   Collection Time: 10/11/14  2:47 AM  Result Value Ref Range   Sodium 141 135 - 145 mmol/L   Potassium 3.3 (L) 3.5 - 5.1 mmol/L   Chloride 110 101 - 111 mmol/L   CO2 22 22 - 32 mmol/L   Glucose, Bld 111 (H) 65 - 99 mg/dL   BUN 9 6 - 20 mg/dL   Creatinine, Ser 0.66 0.44 - 1.00 mg/dL   Calcium 8.4 (L) 8.9 - 10.3 mg/dL   GFR calc non Af Amer >60 >60 mL/min   GFR calc Af Amer >60 >60 mL/min    Comment: (NOTE) The eGFR has been calculated using the CKD EPI equation. This calculation has not been validated in all clinical situations. eGFR's persistently <60 mL/min signify possible Chronic Kidney Disease.    Anion gap 9 5 - 15  Basic metabolic panel     Status: None   Collection Time:  10/12/14  6:30 AM  Result Value Ref Range   Sodium 144 135 - 145 mmol/L   Potassium 4.3 3.5 - 5.1 mmol/L   Chloride 110 101 - 111 mmol/L   CO2 26 22 - 32 mmol/L   Glucose, Bld 92 65 - 99 mg/dL   BUN 10 6 - 20 mg/dL   Creatinine, Ser 0.51 0.44 - 1.00 mg/dL   Calcium 9.0 8.9 - 10.3 mg/dL   GFR calc non Af Amer >60 >60 mL/min   GFR calc Af Amer >60 >60 mL/min    Comment: (NOTE) The eGFR has been calculated using the CKD EPI equation. This calculation has not been validated in all clinical situations. eGFR's persistently <60 mL/min signify possible Chronic Kidney Disease.    Anion gap 8 5 - 15    Vitals: Blood pressure 107/54, pulse 96, temperature 98.5 F (36.9 C), temperature source Oral, resp. rate 18, height _0  (1.6 m), weight 73.936 kg (163 lb), SpO2 100 %.  Risk to Self: Is patient at risk for suicide?: No Risk to Others:   Prior Inpatient Therapy:   Prior Outpatient Therapy:    Current  Facility-Administered Medications  Medication Dose Route Frequency Provider Last Rate Last Dose  . acetaminophen (TYLENOL) tablet 650 mg  650 mg Oral Q6H PRN Leone Brand, MD   650 mg at 10/10/14 2301   Or  . acetaminophen (TYLENOL) suppository 650 mg  650 mg Rectal Q6H PRN Leone Brand, MD      . antiseptic oral rinse (CPC / CETYLPYRIDINIUM CHLORIDE 0.05%) solution 7 mL  7 mL Mouth Rinse BID Leeanne Rio, MD   7 mL at 10/10/14 1000  . divalproex (DEPAKOTE ER) 24 hr tablet 500 mg  500 mg Oral Daily Veatrice Bourbon, MD   500 mg at 10/12/14 1004  . DULoxetine (CYMBALTA) DR capsule 30 mg  30 mg Oral Daily Veatrice Bourbon, MD   30 mg at 10/12/14 1004  . enoxaparin (LOVENOX) injection 40 mg  40 mg Subcutaneous Q24H Durwin Nora, RPH   40 mg at 10/12/14 1004  . free water 300 mL  300 mL Oral 3 times per day Virginia Crews, MD   300 mL at 10/10/14 2300  . levothyroxine (SYNTHROID, LEVOTHROID) tablet 137 mcg  137 mcg Oral QAC breakfast Veatrice Bourbon, MD   137 mcg at 10/12/14 3474  . LORazepam (ATIVAN) injection 0.5 mg  0.5 mg Intravenous Q4H PRN Veatrice Bourbon, MD   0.5 mg at 10/12/14 0323  . metFORMIN (GLUCOPHAGE) tablet 1,000 mg  1,000 mg Oral Q breakfast Asiyah Cletis Media, MD   1,000 mg at 10/12/14 2595  . ondansetron (ZOFRAN) tablet 4 mg  4 mg Oral Q6H PRN Leone Brand, MD       Or  . ondansetron [facility]) injection 4 mg  4 mg Intravenous Q6H PRN Leone Brand, MD      . QUEtiapine (SEROQUEL) tablet 100 mg  100 mg Oral TID Ambrose Finland, MD   100 mg at 10/12/14 1003  . sodium chloride 0.9 % injection 3 mL  3 mL Intravenous Q12H Leone Brand, MD   3 mL at 10/11/14 2217    Musculoskeletal: Strength & Muscle Tone: increased Gait & Station: unable to stand Patient leans: N/A  Psychiatric Specialty Exam: Physical Exam   ROS   Blood pressure 107/54, pulse 96, temperature 98.5 F (36.9 C), temperature source Oral, resp. rate 18, height _1  (  1.6 m), weight 73.936  kg (163 lb), SpO2 100 %.Body mass index is 28.88 kg/(m^2).  General Appearance: Casual  Eye Contact::  Good  Speech:  Clear and Coherent  Volume:  Decreased  Mood:  Euthymic  Affect:  Appropriate and Congruent  Thought Process:  Disorganized  Orientation:  Negative  Thought Content:  WDL  Suicidal Thoughts:  No  Homicidal Thoughts:  No  Memory:  Immediate;   Good Recent;   Good  Judgement:  Good  Insight:  Fair  Psychomotor Activity:  Normal  Concentration:  Fair  Recall:  Starr of Knowledge:Good  Language: Fair  Akathisia:  Negative  Handed:  Right  AIMS (if indicated):     Assets:  Catering manager Housing Leisure Time Resilience Social Support Transportation  ADL's:  Intact  Cognition: WNL  Sleep:      Medical Decision Making: Review of Psycho-Social Stressors (1), Review or order clinical lab tests (1), Established Problem, Worsening (2), Review of Last Therapy Session (1), Review or order medicine tests (1), Review of Medication Regimen & Side Effects (2) and Review of New Medication or Change in Dosage (2)  Treatment Plan Summary: Patient has bipolar mania and pneumonia which was improved with the current medication management. Patient does not meet criteria for acute psychiatric hospitalization and will be placed out of home as patient requested at this time. Patient has no safety concerns at this time. Daily contact with patient to assess and evaluate symptoms and progress in treatment and Medication management  Plan:  Patient has capacity to make her own medical decisions and living arrangement  Patient is willing to be placed in a group home and follow up with outpatient medication management Continue Depakote ER 500 mg PO Qam for mood swings - monitor of valproice acid level Continue Cymbalta 30 mg Qam for depression Continue Seroquel 100 mg PO TID for mood swings Recommend psychiatric Inpatient admission when medically cleared. Supportive  therapy provided about ongoing stressors. Appreciate psychiatric consultation and we sign off at this time Please contact [phone] or [phone] if needs further assistance  Disposition: Patient will be placed out of home as patient requested because she is legally blind and unable to care for herself.  Mat Stuard,JANARDHAHA R. 10/12/2014 10:27 AM

## 2014-10-12 NOTE — Discharge Instructions (Signed)
Please stay well hydrated and drink at least 4-5 glasses of water per day. Please follow up with your PCP as soon as possible  Follow-up Information    Schedule an appointment as soon as possible for a visit with Saralyn Pilar, MD.   Specialty:  Family Medicine   Contact information:   [place] [address] [phone]

## 2014-10-12 NOTE — Clinical Social Work Psych Note (Signed)
Psych CSW spoke directly with Karsten Fells, ACTT team liaison.  Herbie Baltimore states that patient is active with ACTT team who is also capable to assist with placing patient at either a group home or assisted living facility.  Herbie Baltimore states that the patient has historically been cycling with non-compliance; however realizes that patient is at baseline with medications titrated.  Psych CSW spoke directly with Drucie Opitz ACTT Director who was also provided an update regarding disposition.  Asencion Partridge is agreeable to having ACTT team follow the patient closely at home once discharged.  Asencion Partridge is also agreeable to assisting with [facility] placement.  Psych CSW provided update to patient, patient's daughter, MD and psychiatrist.  ACTT team to follow up with the patient Friday 10/13/2014.    Patient has capacity at this time to make her own decisions.  PT has signed off stating patient has full independence.  Patient has stable housing and an ACTT team for outpatient follow-up.  Of note, patient also received medication management from [facility].  Patient has been denied from [facility] and [facility]    Disposition: Patient to return home with ACTT team following.    Nonnie Done, LCSW [phone]  Psychiatric & Orthopedics (5N 1-8) Clinical Social Worker

## 2014-10-12 NOTE — Progress Notes (Signed)
Family Medicine Teaching Service Daily Progress Note Intern Pager: [phone]  Patient name: Kelsey Hanna Medical record number: [id number] Date of birth: 04-11-1950 Age: 64 y.o. Gender: female  Primary Care Provider: Saralyn Pilar, MD Consultants: Psychiatry Code Status: DNR  Pt Overview and Major Events to Date:  8/18: Admitted for AMS, SIRS, severe hypernatremia, mania  Assessment and Plan: Kelsey Hanna is a 64 y.o. female presenting with altered mental status and fever after being found down in her apartment. PMH is significant for Bipolar Disorder, T2DM, Hypothyroidism, ?afib  # Sepsis with hypotension- resolved, + blood culture representing contaminant versus transient bacteremia - 8/17 BCx: Coag negative staph, BCx 8/19 NGTD  - Ucx insignificant growth 3,000 cfu, CSF Cx - NGTD - s/p Vanc x7d total per discussion with ID  # Hypernatremia: Resolved. Na 165 on admission, now 144, s/p  1/2NS and D5W. D/Ced with resolution  - Continue 300 ml free water TID   - Bmet daily   # Hypokalemia K 3.3--> 4.3 Resolved   # Altered Mental status and bipolar- Manic state likely secondary to Bipolar manic state+ metabolic encephalopathy+ hypernatremia - mania state improving, but still intermittently aggitated -Continue home Quetiapine, cymbalta, depakote as able to take PO - Psych consult, -->> will need inpatient psych after medically cleared - Consider Haldol, if patient becomes agigtated - consider soft restraints if agitation remains an issue  #Thrombocytopenia: resolved, neg HIT panel - d/c lovenox, pt ambulating well  # AKI: Resolved. Cr 0.66 - Continue to trending BMET   # Hypothyroidism, currently hyperthyroid. TSH .21, T4 .94. Potentially secondary to inappropriate dosing (med list has both 162mcg and 152mcg doses, daughter unable to confirm her actual dose).  - Repeat TSH, free T4 again as outpatient - Continue synthroid 137mcg daily  #DM2- Last A1c 5.9  09/06/2014, stable - continue home metformin  FEN/GI:  Soft diet, d/c IV access Prophylaxis: encourage ambulation  Disposition: Clinically stable, ready for discharge to home per Psych  Subjective:  Pt states she feels well, no complaints  Objective: Temp:  [98.5 F (36.9 C)] 98.5 F (36.9 C) (08/24 1055) Pulse Rate:  [94-96] 96 (08/24 2118) Resp:  [18] 18 (08/24 2118) BP: (107-128)/(54-59) 107/54 mmHg (08/24 2118) SpO2:  [100 %] 100 % (08/24 2118) Weight:  [163 lb (73.936 kg)] 163 lb (73.936 kg) (08/24 1051) Physical Exam: General: Lying in bed, naked, no acute distress.  HEENT: Left eye clouding, right eye EOMI, MMM Cardiovascular: RRR no m/r/g Respiratory: CTAB, normal WOB no wheezes, rales or crackles.  Abdomen: soft, non tender, no organomegally palpated, + BS Skin:  no rashes noted, stable arm bruising Neuro: oriented to name and place, and time Psych: Pleasant and cooperative. Following commands today  Laboratory:  Recent Labs Lab 10/07/14 0512 10/08/14 0115 10/09/14 0302  WBC 7.1 7.3 7.9  HGB 11.6* 10.7* 10.4*  HCT 34.5* 31.2* 30.8*  PLT 142* 151 156    Recent Labs Lab 10/10/14 0336 10/11/14 0247 10/12/14 0630  NA 143 141 144  K 3.7 3.3* 4.3  CL 112* 110 110  CO2 20* 22 26  BUN 9 9 10   CREATININE 0.75 0.66 0.51  CALCIUM 8.8* 8.4* 9.0  GLUCOSE 111* 111* 92     Imaging/Diagnostic Tests: CT chest/abd/pelvis  IMPRESSION: No acute finding chest, abdomen or pelvis.  CT head  IMPRESSION: 1. Stable atrophy and white matter disease. 2. No acute intracranial abnormality. 3. Stable degenerative changes in the cervical spine. 4. No acute fracture or  traumatic subluxation.   Veatrice Bourbon, MD 10/12/2014, 8:45 AM PGY-2, [place] Intern pager: [phone], text pages welcome

## 2014-10-12 NOTE — Progress Notes (Signed)
Utilization Review completed. Tinslee Klare RN BSN CM 

## 2014-10-13 NOTE — Progress Notes (Signed)
Pt refused am CBG.

## 2014-10-13 NOTE — Progress Notes (Signed)
Family Medicine Teaching Service Daily Progress Note Intern Pager: [phone]  Patient name: Kelsey Hanna Medical record number: [id number] Date of birth: Aug 26, 1950 Age: 64 y.o. Gender: female  Primary Care Provider: Saralyn Pilar, MD Consultants: Psychiatry Code Status: DNR  Pt Overview and Major Events to Date:  8/18: Admitted for AMS, SIRS, severe hypernatremia, mania  Assessment and Plan: Kelsey Hanna is a 64 y.o. female presenting with altered mental status and fever after being found down in her apartment. PMH is significant for Bipolar Disorder, T2DM, Hypothyroidism, ?afib  # Sepsis with hypotension- resolved, + blood culture representing contaminant versus transient bacteremia - 8/17 BCx: Coag negative staph, BCx 8/19 NGTD  - Ucx insignificant growth 3,000 cfu, CSF Cx - NGTD - s/p Vanc x7d total per discussion with ID  # Hypernatremia: Resolved. Na 165 on admission, now 144, s/p  1/2NS and D5W. D/Ced with resolution  - Continue 300 ml free water TID   - Bmet daily   # Hypokalemia K 3.3--> 4.3 Resolved   # Altered Mental status and bipolar- Manic state likely secondary to Bipolar manic state+ metabolic encephalopathy+ hypernatremia - mania state improving, but still intermittently aggitated -Continue home Quetiapine, cymbalta, depakote as able to take PO - Psych consult, -->> will need inpatient psych after medically cleared - Consider Haldol, if patient becomes agigtated - consider soft restraints if agitation remains an issue  #Thrombocytopenia: resolved, neg HIT panel - d/c lovenox, pt ambulating well  # AKI: Resolved. Cr 0.66 - Continue to trending BMET   # Hypothyroidism, currently hyperthyroid. TSH .21, T4 .94. Potentially secondary to inappropriate dosing (med list has both 17mcg and 169mcg doses, daughter unable to confirm her actual dose).  - Repeat TSH, free T4 again as outpatient - Continue synthroid 152mcg daily  #DM2- Last A1c 5.9  09/06/2014, stable - continue home metformin  FEN/GI:  Soft diet, d/c IV access Prophylaxis: encourage ambulation  Disposition: Clinically stable, ready for discharge to home per Psych  Subjective:  Pt states she feels well, no complaints  Objective: Temp:  [98.3 F (36.8 C)] 98.3 F (36.8 C) (08/25 1349) Pulse Rate:  [107] 107 (08/25 1349) Resp:  [17] 17 (08/25 1349) SpO2:  [100 %] 100 % (08/25 1349) Physical Exam: General: Lying in bed, only wearing panties, no acute distress.  HEENT: Left eye clouding, right eye EOMI, MMM Cardiovascular: RRR no m/r/g Respiratory: CTAB, normal WOB no wheezes, rales or crackles.  Abdomen: soft, non tender, no organomegally palpated, + BS Skin:  no rashes noted, stable arm bruising Neuro: oriented to name and place, and time Psych: Pleasant and cooperative. Following commands today  Laboratory:  Recent Labs Lab 10/07/14 0512 10/08/14 0115 10/09/14 0302  WBC 7.1 7.3 7.9  HGB 11.6* 10.7* 10.4*  HCT 34.5* 31.2* 30.8*  PLT 142* 151 156    Recent Labs Lab 10/10/14 0336 10/11/14 0247 10/12/14 0630  NA 143 141 144  K 3.7 3.3* 4.3  CL 112* 110 110  CO2 20* 22 26  BUN 9 9 10   CREATININE 0.75 0.66 0.51  CALCIUM 8.8* 8.4* 9.0  GLUCOSE 111* 111* 92     Imaging/Diagnostic Tests: CT chest/abd/pelvis  IMPRESSION: No acute finding chest, abdomen or pelvis.  CT head  IMPRESSION: 1. Stable atrophy and white matter disease. 2. No acute intracranial abnormality. 3. Stable degenerative changes in the cervical spine. 4. No acute fracture or traumatic subluxation.   Veatrice Bourbon, MD 10/13/2014, 9:28 AM PGY-2, [place]  [place] Intern pager: [phone], text pages welcome

## 2014-10-13 NOTE — Progress Notes (Signed)
Pt discharged education and instruction reviewed questions addressed, Vitals stable for pt, no IV access. No Telemetry. Left with care provider from her ACTT facility. 10/13/2014 12:28 PM Jaeceon Michelin

## 2014-10-13 NOTE — Clinical Social Work Psych Note (Addendum)
Psych CSW spoke with Drucie Opitz, Director of [employer] [phone] who states an ACTT member, Jeneen Rinks will transport patient home.  RN and MD updated.  Nonnie Done, LCSW [phone]  Psychiatric & Orthopedics (5N 1-8) Clinical Social Worker

## 2014-10-13 NOTE — Care Management Important Message (Signed)
Important Message  Patient Details  Name: Kelsey Hanna MRN: [id number] Date of Birth: [birth year]   Medicare Important Message Given:  Yes-third notification given (patient states do not leave in her room will put in chart)    Zenon Mayo, RN 10/13/2014, 10:38 AMImportant Message  Patient Details  Name: Kelsey Hanna MRN: [id number] Date of Birth: [birth year]   Medicare Important Message Given:  Yes-third notification given (patient states do not leave in her room will put in chart)    Zenon Mayo, RN 10/13/2014, 10:38 AM

## 2014-10-13 NOTE — Progress Notes (Signed)
HPI: FU atrial fibrillation. Initially seen in March 2016 after being found down following fall. She was in atrial fibrillation but converted. Anticoagulation felt to be too high risk at that time as she is bipolar and was confused. Echocardiogram March 2016 showed ejection fraction 45-50%. Mild to moderate mitral regurgitation and mild to moderate elevation in pulmonary pressures. Discharged on August 26 following admission for sepsis.  Current Outpatient Prescriptions  Medication Sig Dispense Refill  . aspirin 325 MG EC tablet Take 1 tablet (325 mg total) by mouth daily. For heart health 30 tablet 0  . atorvastatin (LIPITOR) 40 MG tablet Take 1 tablet (40 mg total) by mouth every evening. For high cholesterol (Patient taking differently: Take 40 mg by mouth daily. For high cholesterol)    . cyclobenzaprine (FLEXERIL) 10 MG tablet Take 1 tablet (10 mg total) by mouth 3 (three) times daily as needed for muscle spasms. 30 tablet 0  . diclofenac sodium (VOLTAREN) 1 % GEL Apply 1 application topically daily. For arthritic pain    . divalproex (DEPAKOTE ER) 500 MG 24 hr tablet Take 1 tablet (500 mg total) by mouth daily. (Patient taking differently: Take 1,000 mg by mouth at bedtime. ) 10 tablet 0  . DULoxetine (CYMBALTA) 30 MG capsule Take 1 capsule (30 mg total) by mouth daily. 10 capsule 0  . fluticasone (FLONASE) 50 MCG/ACT nasal spray Place 2 sprays into both nostrils 2 (two) times daily. For allergies  2  . furosemide (LASIX) 80 MG tablet Take 1 tablet (80 mg total) by mouth daily. 10 tablet 0  . gabapentin (NEURONTIN) 300 MG capsule Take 1 capsule (300 mg total) by mouth 3 (three) times daily. 30 capsule -  . ibuprofen (ADVIL,MOTRIN) 200 MG tablet Take 200 mg by mouth every 6 (six) hours as needed for moderate pain.    Marland Kitchen levothyroxine (SYNTHROID) 137 MCG tablet Take 137 mcg by mouth daily before breakfast.    . lisinopril (PRINIVIL,ZESTRIL) 5 MG tablet Take 1 tablet (5 mg total) by mouth  daily. For high blood pressure 30 tablet 0  . LORazepam (ATIVAN) 0.5 MG tablet Take 1 tablet (0.5 mg total) by mouth 2 (two) times daily. For severe anxiety 30 tablet 0  . meclizine (ANTIVERT) 12.5 MG tablet Take 1 tablet (12.5 mg total) by mouth 3 (three) times daily as needed for dizziness. 20 tablet 0  . meloxicam (MOBIC) 7.5 MG tablet Take 1 tablet (7.5 mg total) by mouth daily. 20 tablet 0  . metFORMIN (GLUCOPHAGE) 500 MG tablet Take 1 tablet (500 mg total) by mouth daily with breakfast. For diabetes mellitus (Patient taking differently: Take 500 mg by mouth 2 (two) times daily with a meal. For diabetes mellitus) 30 tablet 0  . metoprolol (LOPRESSOR) 50 MG tablet Take 1 tablet (50 mg total) by mouth 2 (two) times daily with a meal. For high blood pressure 60 tablet 0  . naproxen sodium (ANAPROX) 220 MG tablet Take 220 mg by mouth 2 (two) times daily as needed (pain).    . nicotine polacrilex (NICORETTE) 2 MG gum Take 1 each (2 mg total) by mouth as needed for smoking cessation. 100 tablet 0  . potassium chloride 20 MEQ TBCR Take 20 mEq by mouth daily. For low potassium 15 tablet 0  . QUEtiapine (SEROQUEL) 100 MG tablet Take 1 tablet (100 mg total) by mouth 2 (two) times daily. 20 tablet 0  . zolpidem (AMBIEN) 5 MG tablet Take 1 tablet (5 mg  total) by mouth at bedtime as needed for sleep. (Patient not taking: Reported on 10/05/2014) 7 tablet 0   No current facility-administered medications for this visit.   Facility-Administered Medications Ordered in Other Visits  Medication Dose Route Frequency Provider Last Rate Last Dose  . acetaminophen (TYLENOL) tablet 650 mg  650 mg Oral Q6H PRN Leone Brand, MD   650 mg at 10/10/14 2301   Or  . acetaminophen (TYLENOL) suppository 650 mg  650 mg Rectal Q6H PRN Leone Brand, MD      . antiseptic oral rinse (CPC / CETYLPYRIDINIUM CHLORIDE 0.05%) solution 7 mL  7 mL Mouth Rinse BID Leeanne Rio, MD   7 mL at 10/13/14 1115  . divalproex (DEPAKOTE  ER) 24 hr tablet 500 mg  500 mg Oral Daily Veatrice Bourbon, MD   500 mg at 10/13/14 1115  . DULoxetine (CYMBALTA) DR capsule 30 mg  30 mg Oral Daily Alyssa A Haney, MD   30 mg at 10/13/14 1115  . free water 300 mL  300 mL Oral 3 times per day Virginia Crews, MD   300 mL at 10/13/14 1056  . levothyroxine (SYNTHROID, LEVOTHROID) tablet 137 mcg  137 mcg Oral QAC breakfast Veatrice Bourbon, MD   137 mcg at 10/13/14 0844  . LORazepam (ATIVAN) tablet 0.5 mg  0.5 mg Oral BID PRN Rogue Bussing, MD   0.5 mg at 10/13/14 0518  . metFORMIN (GLUCOPHAGE) tablet 1,000 mg  1,000 mg Oral Q breakfast Asiyah Cletis Media, MD   1,000 mg at 10/13/14 0844  . ondansetron (ZOFRAN) tablet 4 mg  4 mg Oral Q6H PRN Leone Brand, MD       Or  . ondansetron [facility]) injection 4 mg  4 mg Intravenous Q6H PRN Leone Brand, MD      . QUEtiapine (SEROQUEL) tablet 100 mg  100 mg Oral TID Ambrose Finland, MD   100 mg at 10/13/14 1115  . sodium chloride 0.9 % injection 3 mL  3 mL Intravenous Q12H Leone Brand, MD   3 mL at 10/11/14 2217     Past Medical History  Diagnosis Date  . Bipolar 1 disorder   . Hypothyroid   . Dyslipidemia   . Retinal detachment   . Sleep apnea   . Diabetes mellitus     dx within last yr....just takes pills  . Cancer     cancer of uterus...no chemo or radiation    Past Surgical History  Procedure Laterality Date  . Abdominal hysterectomy    . Total abdominal hysterectomy w/ bilateral salpingoophorectomy    . Tonsillectomy    . Lumbar laminectomy/decompression microdiscectomy  07/17/2011    Procedure: LUMBAR LAMINECTOMY/DECOMPRESSION MICRODISCECTOMY;  Surgeon: Sinclair Ship, MD;  Location: [facility];  Service: Orthopedics;  Laterality: Left;  Left sided lumbar 4-5 microdisectomy    Social History   Social History  . Marital Status: Divorced    Spouse Name: N/A  . Number of Children: N/A  . Years of Education: N/A   Occupational History  . Not on file.    Social History Main Topics  . Smoking status: Current Every Day Smoker -- 0.25 packs/day for 35 years    Types: Cigarettes    Last Attempt to Quit: 07/09/1999  . Smokeless tobacco: Not on file  . Alcohol Use: No     Comment: socially  . Drug Use: No  . Sexual Activity: Not on file   Other  Topics Concern  . Not on file   Social History Narrative    ROS: no fevers or chills, productive cough, hemoptysis, dysphasia, odynophagia, melena, hematochezia, dysuria, hematuria, rash, seizure activity, orthopnea, PND, pedal edema, claudication. Remaining systems are negative.  Physical Exam: Well-developed well-nourished in no acute distress.  Skin is warm and dry.  HEENT is normal.  Neck is supple.  Chest is clear to auscultation with normal expansion.  Cardiovascular exam is regular rate and rhythm.  Abdominal exam nontender or distended. No masses palpated. Extremities show no edema. neuro grossly intact  ECG     This encounter was created in error - please disregard.

## 2014-10-13 NOTE — Discharge Summary (Signed)
[facility] Discharge Summary  Patient name: Kelsey Hanna Medical record number: [id number] Date of birth: [birth year] Age: 64 y.o. Gender: female Date of Admission: 10/04/2014  Date of Discharge: 10/13/2014  Admitting Physician: Leeanne Rio, MD  Primary Care Provider: Saralyn Pilar, MD Consultants:  Infectious disease Psyhiatry  Indication for Hospitalization:  Bipolar 1 Manic State Hypernatremia Concern for Sepsis  Discharge Diagnoses/Problem List:  Patient Active Problem List   Diagnosis Date Noted  . Psychoses   . Bacteremia   . Sepsis with hypotension 10/04/2014  . Toxic metabolic encephalopathy 23/55/7322  . AKI (acute kidney injury) 10/04/2014  . Acute renal failure syndrome   . Altered mental status   . Hypernatremia   . Pedal edema 09/12/2014  . Bipolar disorder, current episode manic without psychotic features, severe   . Bipolar affective disorder, current episode manic without psychotic symptoms 09/05/2014  . Bipolar affective disorder, current episode manic with psychotic symptoms 09/05/2014  . Bipolar affective disorder, manic 09/03/2014  . Encounter for preadmission testing   . Head revolving around 07/27/2014  . BP (high blood pressure) 05/23/2014  . AF (paroxysmal atrial fibrillation) 05/23/2014  . PNA (pneumonia) 04/27/2014  . Type 2 diabetes mellitus 04/27/2014  . Essential hypertension 04/27/2014  . Hypothyroidism 04/27/2014  . Tobacco abuse 04/27/2014  . Bipolar I disorder, most recent episode (or current) unspecified 04/20/2014  . Acute encephalopathy 04/18/2014  . Unresponsiveness   . Acute respiratory failure   . Encounter for feeding tube placement   . Rapid atrial fibrillation   . Shortness of breath   . Gout 08/25/2013  . 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 HLD (hyperlipidemia) 08/30/2009  . Apnea, sleep 04/05/2009     Disposition: [facility]patient [facility]  Discharge Condition:  Stable  Discharge Exam:   Temp: [97.7 F (36.5 C)-98.1 F (36.7 C)] 98.1 F (36.7 C) (08/23 0540) Pulse Rate: [94] 94 (08/22 0934) Resp: [19-23] 23 (08/23 0000) BP: (122-139)/(72-81) 122/72 mmHg (08/23 0540) SpO2: [98 %-99 %] 98 % (08/23 0540) Physical Exam: General: Lying in bed, naked,, no acute distress.  HEENT: Left eye clouding, right eye EOMI, MMM Cardiovascular: RRR no m/r/g Respiratory: CTAB, normal WOB no wheezes, rales or crackles.  Abdomen: soft, non tender, no organomegally palpated, + BS Skin: no rashes noted Neuro: oriented to name and place, and time Psych: Pleasant and cooperative. Following commands today   Brief Hospital Course:  Kelsey Hanna is a 64 y.o. female who presented with with altered mental status and fever after being found down in her apartment. PMH is significant for Bipolar Disorder, T2DM, Hypothyroidism,   On admission to the ED she was found to be hypernatremic to 165. She was started on 1/2 NS IV fluids with basic metabolic panels every 4 hours. Her sodium slowly trended down, but given the very slow rate of correction her fluids were switched to D5W with scheduled oral free water with resolution of her hypernatremia  She was additionally febrile to 104.7 with a WBC of 17.7. Given concern for sepsis, she had blood and urine cultures and vancomycin and zosyn were started. Her fever and WBC resolved. Her blood culture grew coagulase negative Staph concerning for a contaminant rather than sepsis. Her zosyn was discontinued after 2 days, and she was maintained on vancomycin to complete a 7 day course. Blood cultures were repeated and were negative x 5 days at time of discharge. Urine cultures had insignificant growth.  For her Manic state, she  was continued on her home bipolar medications when proven she could tolerate oral intake. Psychiatry was consulted and they followed her care while inpatient. Her mental status slowly improved and the  ultimately deemed she was stable to be discharged to home with close follow up with the ACT team  However, when trying to discharge her on 8/25 she it was found that her that was being renovated from damages she made while Manic. She had no money at that time for a hotel and her daughter was unwilling to take her to her home. Her discharge was delayed until 8/26 when appropriate housing could be found. She was discharged to home as it had been finished being prepared for her   Issues for Follow Up:  - Consider outpatient thyroid testing - Control of bipolar symptoms - ACT team follow up  Significant Procedures:  None  Significant Labs and Imaging:   Recent Labs Lab 10/07/14 0512 10/08/14 0115 10/09/14 0302  WBC 7.1 7.3 7.9  HGB 11.6* 10.7* 10.4*  HCT 34.5* 31.2* 30.8*  PLT 142* 151 156    Recent Labs Lab 10/08/14 0115 10/09/14 0302 10/10/14 0336 10/11/14 0247 10/12/14 0630  NA 146* 144 143 141 144  K 3.2* 3.7 3.7 3.3* 4.3  CL 116* 114* 112* 110 110  CO2 22 20* 20* 22 26  GLUCOSE 111* 105* 111* 111* 92  BUN 11 9 9 9 10   CREATININE 0.62 0.56 0.75 0.66 0.51  CALCIUM 8.1* 8.1* 8.8* 8.4* 9.0      Results/Tests Pending at Time of Discharge:  None  Discharge Medications:    Medication List    STOP taking these medications        colchicine 0.6 MG tablet      TAKE these medications        aspirin 325 MG EC tablet  Take 1 tablet (325 mg total) by mouth daily. For heart health     atorvastatin 40 MG tablet  Commonly known as:  LIPITOR  Take 1 tablet (40 mg total) by mouth every evening. For high cholesterol     cyclobenzaprine 10 MG tablet  Commonly known as:  FLEXERIL  Take 1 tablet (10 mg total) by mouth 3 (three) times daily as needed for muscle spasms.     diclofenac sodium 1 % Gel  Commonly known as:  VOLTAREN  Apply 1 application topically daily. For arthritic pain     divalproex 500 MG 24 hr tablet  Commonly known as:  DEPAKOTE ER  Take 1  tablet (500 mg total) by mouth daily.     DULoxetine 30 MG capsule  Commonly known as:  CYMBALTA  Take 1 capsule (30 mg total) by mouth daily.     fluticasone 50 MCG/ACT nasal spray  Commonly known as:  FLONASE  Place 2 sprays into both nostrils 2 (two) times daily. For allergies     furosemide 80 MG tablet  Commonly known as:  LASIX  Take 1 tablet (80 mg total) by mouth daily.     gabapentin 300 MG capsule  Commonly known as:  NEURONTIN  Take 1 capsule (300 mg total) by mouth 3 (three) times daily.     ibuprofen 200 MG tablet  Commonly known as:  ADVIL,MOTRIN  Take 200 mg by mouth every 6 (six) hours as needed for moderate pain.     lisinopril 5 MG tablet  Commonly known as:  PRINIVIL,ZESTRIL  Take 1 tablet (5 mg total) by mouth daily. For high blood  pressure     LORazepam 0.5 MG tablet  Commonly known as:  ATIVAN  Take 1 tablet (0.5 mg total) by mouth 2 (two) times daily. For severe anxiety     meclizine 12.5 MG tablet  Commonly known as:  ANTIVERT  Take 1 tablet (12.5 mg total) by mouth 3 (three) times daily as needed for dizziness.     meloxicam 7.5 MG tablet  Commonly known as:  MOBIC  Take 1 tablet (7.5 mg total) by mouth daily.     metFORMIN 500 MG tablet  Commonly known as:  GLUCOPHAGE  Take 1 tablet (500 mg total) by mouth daily with breakfast. For diabetes mellitus     metoprolol 50 MG tablet  Commonly known as:  LOPRESSOR  Take 1 tablet (50 mg total) by mouth 2 (two) times daily with a meal. For high blood pressure     naproxen sodium 220 MG tablet  Commonly known as:  ANAPROX  Take 220 mg by mouth 2 (two) times daily as needed (pain).     nicotine polacrilex 2 MG gum  Commonly known as:  NICORETTE  Take 1 each (2 mg total) by mouth as needed for smoking cessation.     Potassium Chloride ER 20 MEQ Tbcr  Take 20 mEq by mouth daily. For low potassium     QUEtiapine 100 MG tablet  Commonly known as:  SEROQUEL  Take 1 tablet (100 mg total) by mouth 2  (two) times daily.     SYNTHROID 137 MCG tablet  Generic drug:  levothyroxine  Take 137 mcg by mouth daily before breakfast.     zolpidem 5 MG tablet  Commonly known as:  AMBIEN  Take 1 tablet (5 mg total) by mouth at bedtime as needed for sleep.        Discharge Instructions: Please refer to Patient Instructions section of EMR for full details.  Patient was counseled important signs and symptoms that should prompt return to medical care, changes in medications, dietary instructions, activity restrictions, and follow up appointments.   Follow-Up Appointments: Follow-up Information    Schedule an appointment as soon as possible for a visit with Saralyn Pilar, MD.   Specialty:  [facility]   Contact information:   [place] [address] [phone]       [place]. Lincoln Brigham MD, [facility] Resident PGY-2 Pager [phone]

## 2014-10-17 ENCOUNTER — Encounter: Payer: Self-pay | Admitting: Cardiology

## 2014-10-19 ENCOUNTER — Encounter (HOSPITAL_COMMUNITY): Payer: Self-pay

## 2014-10-19 ENCOUNTER — Emergency Department (HOSPITAL_COMMUNITY)
Admission: EM | Admit: 2014-10-19 | Discharge: 2014-10-20 | Disposition: A | Discharge status: Other Acute Inpatient Hospital | Payer: Medicare Other | Attending: Emergency Medicine | Admitting: Emergency Medicine

## 2014-10-19 DIAGNOSIS — F319 Bipolar disorder, unspecified: Secondary | ICD-10-CM | POA: Diagnosis not present

## 2014-10-19 DIAGNOSIS — Z8542 Personal history of malignant neoplasm of other parts of uterus: Secondary | ICD-10-CM | POA: Diagnosis not present

## 2014-10-19 DIAGNOSIS — Z7982 Long term (current) use of aspirin: Secondary | ICD-10-CM | POA: Insufficient documentation

## 2014-10-19 DIAGNOSIS — E119 Type 2 diabetes mellitus without complications: Secondary | ICD-10-CM | POA: Insufficient documentation

## 2014-10-19 DIAGNOSIS — Z049 Encounter for examination and observation for unspecified reason: Secondary | ICD-10-CM

## 2014-10-19 DIAGNOSIS — Z7951 Long term (current) use of inhaled steroids: Secondary | ICD-10-CM | POA: Insufficient documentation

## 2014-10-19 DIAGNOSIS — M79605 Pain in left leg: Secondary | ICD-10-CM | POA: Diagnosis not present

## 2014-10-19 DIAGNOSIS — E785 Hyperlipidemia, unspecified: Secondary | ICD-10-CM | POA: Insufficient documentation

## 2014-10-19 DIAGNOSIS — R079 Chest pain, unspecified: Secondary | ICD-10-CM | POA: Diagnosis not present

## 2014-10-19 DIAGNOSIS — Z720 Tobacco use: Secondary | ICD-10-CM | POA: Insufficient documentation

## 2014-10-19 DIAGNOSIS — Z8669 Personal history of other diseases of the nervous system and sense organs: Secondary | ICD-10-CM | POA: Insufficient documentation

## 2014-10-19 DIAGNOSIS — E039 Hypothyroidism, unspecified: Secondary | ICD-10-CM | POA: Diagnosis not present

## 2014-10-19 DIAGNOSIS — Z791 Long term (current) use of non-steroidal anti-inflammatories (NSAID): Secondary | ICD-10-CM | POA: Insufficient documentation

## 2014-10-19 DIAGNOSIS — M79604 Pain in right leg: Secondary | ICD-10-CM | POA: Diagnosis not present

## 2014-10-19 DIAGNOSIS — Z79899 Other long term (current) drug therapy: Secondary | ICD-10-CM | POA: Insufficient documentation

## 2014-10-19 DIAGNOSIS — Z8619 Personal history of other infectious and parasitic diseases: Secondary | ICD-10-CM | POA: Insufficient documentation

## 2014-10-19 DIAGNOSIS — Z046 Encounter for general psychiatric examination, requested by authority: Secondary | ICD-10-CM | POA: Diagnosis present

## 2014-10-19 LAB — CBC
HCT: 33.2 % — ABNORMAL LOW (ref 36.0–46.0)
Hemoglobin: 11 g/dL — ABNORMAL LOW (ref 12.0–15.0)
MCH: 31.2 pg (ref 26.0–34.0)
MCHC: 33.1 g/dL (ref 30.0–36.0)
MCV: 94.1 fL (ref 78.0–100.0)
PLATELETS: 348 10*3/uL (ref 150–400)
RBC: 3.53 MIL/uL — ABNORMAL LOW (ref 3.87–5.11)
RDW: 15.5 % (ref 11.5–15.5)
WBC: 11.9 10*3/uL — ABNORMAL HIGH (ref 4.0–10.5)

## 2014-10-19 LAB — COMPREHENSIVE METABOLIC PANEL
ALBUMIN: 4.3 g/dL (ref 3.5–5.0)
ALK PHOS: 67 U/L (ref 38–126)
ALT: 35 U/L (ref 14–54)
ANION GAP: 11 (ref 5–15)
AST: 31 U/L (ref 15–41)
BILIRUBIN TOTAL: 0.8 mg/dL (ref 0.3–1.2)
BUN: 15 mg/dL (ref 6–20)
CALCIUM: 10 mg/dL (ref 8.9–10.3)
CO2: 24 mmol/L (ref 22–32)
CREATININE: 0.59 mg/dL (ref 0.44–1.00)
Chloride: 106 mmol/L (ref 101–111)
GFR calc Af Amer: >60 mL/min (ref >60)
GFR calc non Af Amer: >60 mL/min (ref >60)
GLUCOSE: 91 mg/dL (ref 65–99)
Potassium: 4 mmol/L (ref 3.5–5.1)
Sodium: 141 mmol/L (ref 135–145)
TOTAL PROTEIN: 7.2 g/dL (ref 6.5–8.1)

## 2014-10-19 LAB — ETHANOL: Alcohol, Ethyl (B): <5 mg/dL (ref <5)

## 2014-10-19 NOTE — ED Notes (Signed)
Kelsey Hanna, MSW ACT team lead at [place] spoke with [facility] and they may have an opening in their Geriatric area tomorrow. Her contact number is [phone].

## 2014-10-19 NOTE — ED Notes (Signed)
Pt id under IVC by her therapist, he went out today and found her on the floor, her oven was on with nothing cooking the house was a mess. Pt isn't taking her medications and is delusional and is a danger to herself. Her therapist thinks that she cannot take care of herself anymore and probably needs placement. Not aware of any family.

## 2014-10-19 NOTE — ED Notes (Addendum)
TTS Beverely Low) at bedside

## 2014-10-19 NOTE — ED Notes (Signed)
Delay in lab draw, pt changing into gown

## 2014-10-19 NOTE — ED Notes (Signed)
[facility] tried to go to [facility] but patient uses a walker so they wouldn't keep her, pt doesn't have her walker with her

## 2014-10-19 NOTE — BH Assessment (Signed)
Tele Assessment Note   Kelsey Hanna is an 64 y.o. female.  -Clinician reviewed IVC papers which were taken out by patient's therapist.  IVC papers reflect that patient is currently unable to adequately care for herself.  Patient has not been taking medications as prescribed.  Patient had been found in the apartment on the floor with the oven on high and nothing cooking.  Patient has bipolar d/o.  Clinician talked with patient and found out that she lives by herself.  She immediately told clinician that she needs someone in the home with her for meal preparation in the evenings and starts talking about foods to prepare.  Patient needs to be redirected at times back to the question at hand.  She talks about the pain she is in and how it keeps her up all the time.  She says "I am so wound up I cannot relax."  She says that she has only slept 4 hours in the last 8 days.  Patient says that she only takes half of her medications because she has run out of the other half.  Patient says that she is in a lot of pain and goes into detail to describe how she has to situate herself at night to try to rest comfortably.  Patient denies any HI, SI or A/V hallucinations.  She has been at [facility] in the past although she denies ever having been to a inpatient facility.  Patient has med monitoring from [facility].  She gets ACTT services from [facility].  Team lead Drucie Opitz had brought her in.  Carmen's number is [phone].  Clinician called it but there was no answer when called.    Patient does appear to be manic and unable to care for herself.  She reports that she has frequent falls in the home.  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  Patient complains of constant pain.  -Clinician discussed patient care with Arlester Marker, NP who recommends inpatient care for psychiatric stabilization.  Patient will need to have 1st Opinion  completed.  Axis I: Bipolar, Manic Axis II: Deferred Axis III:  Past Medical History  Diagnosis Date  . Bipolar 1 disorder   . Hypothyroid   . Dyslipidemia   . Retinal detachment   . Sleep apnea   . Diabetes mellitus     dx within last yr....just takes pills  . Cancer     cancer of uterus...no chemo or radiation   Axis IV: economic problems, other psychosocial or environmental problems and problems with primary support group Axis V: 31-40 impairment in reality testing  Past Medical History:  Past Medical History  Diagnosis Date  . Bipolar 1 disorder   . Hypothyroid   . Dyslipidemia   . Retinal detachment   . Sleep apnea   . Diabetes mellitus     dx within last yr....just takes pills  . Cancer     cancer of uterus...no chemo or radiation    Past Surgical History  Procedure Laterality Date  . Abdominal hysterectomy    . Total abdominal hysterectomy w/ bilateral salpingoophorectomy    . Tonsillectomy    . Lumbar laminectomy/decompression microdiscectomy  07/17/2011    Procedure: LUMBAR LAMINECTOMY/DECOMPRESSION MICRODISCECTOMY;  Surgeon: Sinclair Ship, MD;  Location: [place];  Service: Orthopedics;  Laterality: Left;  Left sided lumbar 4-5 microdisectomy    Family History: History reviewed. No pertinent family  history.  Social History:  reports that she has been smoking Cigarettes.  She has a 8.75 pack-year smoking history. She does not have any smokeless tobacco history on file. She reports that she does not drink alcohol or use illicit drugs.  Additional Social History:  Alcohol / Drug Use Pain Medications: See PTA medication list Prescriptions: See PTA medications.  According to IVC papers patient is not taking medictions regularly. Over the Counter: See PTA medication History of alcohol / drug use?: No history of alcohol / drug abuse  CIWA: CIWA-Ar BP: 114/60 mmHg Pulse Rate: 83 COWS:    PATIENT STRENGTHS: (choose at least two) Average or above average  intelligence Communication skills  Allergies:  Allergies  Allergen Reactions  . Haldol [Haloperidol] Other (See Comments)    Shaking of the hands, grinding her teeth  . Lithium Other (See Comments)    "makes me feel spaced out" slurred speech    Home Medications:  (Not in a hospital admission)  OB/GYN Status:  No LMP recorded. Patient has had a hysterectomy.  General Assessment Data Location of Assessment: [facility] TTS Assessment: In system Is this a Tele or Face-to-Face Assessment?: Face-to-Face Is this an Initial Assessment or a Re-assessment for this encounter?: Initial Assessment Marital status: Single Is patient pregnant?: No Pregnancy Status: No Living Arrangements: Alone Can pt return to current living arrangement?:  (Patient having difficulty maintaining safety in the home.) Admission Status: Involuntary Is patient capable of signing voluntary admission?: No Referral Source: Other Insurance type: MCR / MCD     Crisis Care Plan Living Arrangements: Alone Name of Psychiatrist: [place] Name of Therapist: [facility]  Education Status Is patient currently in school?: No  Risk to self with the past 6 months Suicidal Ideation: No Has patient been a risk to self within the past 6 months prior to admission? : No Suicidal Intent: No Has patient had any suicidal intent within the past 6 months prior to admission? : No Is patient at risk for suicide?: No Suicidal Plan?: No Has patient had any suicidal plan within the past 6 months prior to admission? : No Access to Means: No What has been your use of drugs/alcohol within the last 12 months?: Pt denies Previous Attempts/Gestures: Yes How many times?: 1 Other Self Harm Risks: Not taking medications as prescribed Triggers for Past Attempts: Family contact, Other (Comment) (Medical issues; Estrangement from adult children) Intentional Self Injurious Behavior: None Family Suicide History: Unknown Recent stressful life  event(s): Recent negative physical changes Persecutory voices/beliefs?: No Depression: Yes Depression Symptoms: Insomnia, Fatigue Substance abuse history and/or treatment for substance abuse?: No Suicide prevention information given to non-admitted patients: Not applicable  Risk to Others within the past 6 months Homicidal Ideation: No Does patient have any lifetime risk of violence toward others beyond the six months prior to admission? : No Thoughts of Harm to Others: No Current Homicidal Intent: No Current Homicidal Plan: No Access to Homicidal Means: No Identified Victim: No one History of harm to others?: No Assessment of Violence: None Noted Violent Behavior Description: None reported Does patient have access to weapons?: No Criminal Charges Pending?: No Does patient have a court date: No Is patient on probation?: No  Psychosis Hallucinations: None noted Delusions: None noted  Mental Status Report Appearance/Hygiene: Disheveled, In hospital gown Eye Contact: Other (Comment) (Pt is legally blind) Motor Activity: Hyperactivity Speech: Pressured Level of Consciousness: Alert Mood: Anxious, Helpless, Preoccupied Affect: Other (Comment) (Hyperactive & manic) Anxiety Level: Moderate Thought Processes:  Irrelevant, Tangential Judgement: Unimpaired Orientation: Person, Place, Situation Obsessive Compulsive Thoughts/Behaviors: Minimal  Cognitive Functioning Concentration: Decreased Memory: Recent Impaired, Remote Intact IQ: Average Insight: Poor Impulse Control: Poor Appetite: Good Weight Loss: 0 Weight Gain: 0 Sleep: Decreased Total Hours of Sleep:  (Claims only 4 hours in last 8 days.) Vegetative Symptoms: None  ADLScreening [facility] Assessment Services) Patient's cognitive ability adequate to safely complete daily activities?: Yes Patient able to express need for assistance with ADLs?: Yes Independently performs ADLs?: No  Prior Inpatient Therapy Prior Inpatient  Therapy: Yes Prior Therapy Dates: 2007, 2008 Prior Therapy Facilty/Provider(s): [facility] Reason for Treatment: Overdose  Prior Outpatient Therapy Prior Outpatient Therapy: Yes Prior Therapy Dates: Current Prior Therapy Facilty/Provider(s): [facility] Reason for Treatment: Bipolar Does patient have an ACCT team?: Yes ([facility] ACTT team [phone]) Does patient have Intensive In-House Services?  : No Does patient have [facility] services? : Yes Does patient have P4CC services?: No  ADL Screening (condition at time of admission) Patient's cognitive ability adequate to safely complete daily activities?: Yes Is the patient deaf or have difficulty hearing?: No Does the patient have difficulty seeing, even when wearing glasses/contacts?: Yes (Pt says that she is legally blind and can only see pinpoints.) Does the patient have difficulty concentrating, remembering, or making decisions?: Yes Patient able to express need for assistance with ADLs?: Yes Does the patient have difficulty dressing or bathing?: Yes Independently performs ADLs?: No Communication: Independent Dressing (OT): Independent Grooming: Needs assistance Is this a change from baseline?: Pre-admission baseline Feeding: Needs assistance Is this a change from baseline?: Pre-admission baseline Bathing: Needs assistance Is this a change from baseline?: Pre-admission baseline Toileting: Independent In/Out Bed: Independent Walks in Home: Needs assistance Is this a change from baseline?: Pre-admission baseline (Pt reports four falls today in the home.) Does the patient have difficulty walking or climbing stairs?: Yes (Uses a walker) Weakness of Legs: Both Weakness of Arms/Hands: Both       Abuse/Neglect Assessment (Assessment to be complete while patient is alone) Physical Abuse: Denies Verbal Abuse: Denies Sexual Abuse: Denies Exploitation of patient/patient's resources: Denies Self-Neglect: Denies     Regulatory affairs officer  (For Healthcare) Does patient have an advance directive?: No Would patient like information on creating an advanced directive?: No - patient declined information    Additional Information 1:1 In Past 12 Months?: Yes CIRT Risk: No Elopement Risk: No Does patient have medical clearance?: Yes     Disposition:  Disposition Initial Assessment Completed for this Encounter: Yes Disposition of Patient: Other dispositions Type of inpatient treatment program: Adult Other disposition(s): Other (Comment) (Pt needs to have 1st opinion completed 09/02)  Curlene Dolphin Ray 10/19/2014 11:31 PM

## 2014-10-20 ENCOUNTER — Emergency Department (HOSPITAL_COMMUNITY): Payer: Medicare Other

## 2014-10-20 DIAGNOSIS — F319 Bipolar disorder, unspecified: Secondary | ICD-10-CM

## 2014-10-20 LAB — URINALYSIS, ROUTINE W REFLEX MICROSCOPIC
BILIRUBIN URINE: NEGATIVE
Glucose, UA: NEGATIVE mg/dL
Hgb urine dipstick: NEGATIVE
Ketones, ur: NEGATIVE mg/dL
Leukocytes, UA: NEGATIVE
NITRITE: NEGATIVE
PROTEIN: NEGATIVE mg/dL
SPECIFIC GRAVITY, URINE: 1.005 (ref 1.005–1.030)
UROBILINOGEN UA: 0.2 mg/dL (ref 0.0–1.0)
pH: 6.5 (ref 5.0–8.0)

## 2014-10-20 LAB — RAPID URINE DRUG SCREEN, HOSP PERFORMED
Amphetamines: NOT DETECTED
Barbiturates: NOT DETECTED
Benzodiazepines: NOT DETECTED
Cocaine: NOT DETECTED
OPIATES: NOT DETECTED
TETRAHYDROCANNABINOL: NOT DETECTED

## 2014-10-20 MED ORDER — ZOLPIDEM TARTRATE 5 MG PO TABS
5.0000 mg | ORAL_TABLET | Freq: Every evening | ORAL | Status: DC | PRN
  Administered 2014-10-20: 5 mg via ORAL
  Filled 2014-10-20: qty 1

## 2014-10-20 MED ORDER — ZIPRASIDONE MESYLATE 20 MG IM SOLR
20.0000 mg | Freq: Once | INTRAMUSCULAR | Status: DC
  Filled 2014-10-20: qty 20

## 2014-10-20 MED ORDER — GABAPENTIN 300 MG PO CAPS
300.0000 mg | ORAL_CAPSULE | Freq: Three times a day (TID) | ORAL | Status: DC
  Administered 2014-10-20: 300 mg via ORAL
  Filled 2014-10-20 (×2): qty 1

## 2014-10-20 MED ORDER — LEVOTHYROXINE SODIUM 137 MCG PO TABS
137.0000 ug | ORAL_TABLET | Freq: Every day | ORAL | Status: DC
  Administered 2014-10-20: 137 ug via ORAL
  Filled 2014-10-20 (×2): qty 1

## 2014-10-20 MED ORDER — QUETIAPINE FUMARATE 100 MG PO TABS: 200.0000 mg | ORAL_TABLET | Freq: Every day | ORAL | Status: DC

## 2014-10-20 MED ORDER — LORAZEPAM 2 MG/ML IJ SOLN
2.0000 mg | Freq: Once | INTRAMUSCULAR | Status: AC
  Administered 2014-10-20: 2 mg via INTRAMUSCULAR
  Filled 2014-10-20: qty 1

## 2014-10-20 MED ORDER — ATORVASTATIN CALCIUM 40 MG PO TABS
40.0000 mg | ORAL_TABLET | Freq: Every day | ORAL | Status: DC
Start: 2014-10-20 — End: 2014-10-20
  Filled 2014-10-20: qty 1

## 2014-10-20 MED ORDER — METFORMIN HCL 500 MG PO TABS
500.0000 mg | ORAL_TABLET | Freq: Two times a day (BID) | ORAL | Status: DC
  Administered 2014-10-20: 500 mg via ORAL
  Filled 2014-10-20 (×3): qty 1

## 2014-10-20 MED ORDER — QUETIAPINE FUMARATE 100 MG PO TABS
100.0000 mg | ORAL_TABLET | Freq: Two times a day (BID) | ORAL | Status: DC
  Administered 2014-10-20: 100 mg via ORAL
  Filled 2014-10-20 (×2): qty 1

## 2014-10-20 MED ORDER — DIVALPROEX SODIUM ER 500 MG PO TB24
1000.0000 mg | ORAL_TABLET | Freq: Every day | ORAL | Status: DC
Start: 2014-10-20 — End: 2014-10-20
  Filled 2014-10-20 (×2): qty 2

## 2014-10-20 MED ORDER — FUROSEMIDE 40 MG PO TABS
80.0000 mg | ORAL_TABLET | Freq: Every day | ORAL | Status: DC
  Administered 2014-10-20: 80 mg via ORAL
  Filled 2014-10-20: qty 2

## 2014-10-20 MED ORDER — TRAZODONE HCL 50 MG PO TABS
50.0000 mg | ORAL_TABLET | Freq: Every evening | ORAL | Status: DC | PRN
Start: 2014-10-20 — End: 2014-10-20

## 2014-10-20 MED ORDER — ASPIRIN EC 325 MG PO TBEC
325.0000 mg | DELAYED_RELEASE_TABLET | Freq: Every day | ORAL | Status: DC
  Filled 2014-10-20: qty 1

## 2014-10-20 MED ORDER — LISINOPRIL 5 MG PO TABS
5.0000 mg | ORAL_TABLET | Freq: Every day | ORAL | Status: DC
  Filled 2014-10-20: qty 1

## 2014-10-20 MED ORDER — LORAZEPAM 2 MG/ML IJ SOLN: 1.0000 mg | Freq: Once | INTRAMUSCULAR | Status: DC

## 2014-10-20 MED ORDER — STERILE WATER FOR INJECTION IJ SOLN
INTRAMUSCULAR | Status: AC
  Filled 2014-10-20: qty 10

## 2014-10-20 NOTE — Progress Notes (Signed)
CSW spoke with Asencion Partridge from [place] actt team. Patient has been ran saking her house, bringing everything out of the freezer, pouring multiple cups of milk and leaving them in the fridge and asking for more milk. Patient actt team states she has not been taking her meds, has ceral all over the floor. Pt continues to talk about moving to [facility] which is not happening and appears to be a delusion. Pt refusing assistance from daughter, who continues to try to assit with patietn needs. Pt recommended for geri psych. CSW sending to be reviewed. Pt has had times of aggression but is redirectable.   Belia Heman, [place] Work  [employer] [phone]

## 2014-10-20 NOTE — Progress Notes (Signed)
Pt accepted to [facility], by Dr. Fabian Sharp to room [phone]. Pt bed not availability yet, csw awaiting return call when bed is ready and transport can be arranged with number for report.   Belia Heman, [place] Work  [employer] [phone]

## 2014-10-20 NOTE — Progress Notes (Signed)
Pt accepted to [facility], by Dr. Fabian Sharp to room [phone]. Rn can call report to [phone]. Pt to be transported by sheriff under IVC.   Belia Heman, [place] Work  [employer] [phone]

## 2014-10-20 NOTE — ED Notes (Signed)
Patient ambulated without any assistive device to the restroom.

## 2014-10-20 NOTE — ED Notes (Signed)
Verbal order given by nursing staff not to disturb patient while she slept.
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
## 2014-10-20 NOTE — Progress Notes (Signed)
CSW consulted by tts due to actt team being concerned about patient living alone, was found with stove on high with nothing cooking. Patient has been non compliant with medications and has had multiple falls. CSW to discuss with psychiatrist once evaluated to determine disposition. CSW will follow up with actt team, and possibly APS once further information gathered. CSW to discuss placement process with patient as well if patient interested.   Belia Heman, [place] Work  [employer] [phone]
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
## 2014-10-20 NOTE — ED Notes (Signed)
Told by charge tech and nurses for the patient in 16 not to wake her for vital signs.

## 2014-11-14 ENCOUNTER — Ambulatory Visit (INDEPENDENT_AMBULATORY_CARE_PROVIDER_SITE_OTHER): Payer: Medicare Other | Admitting: Family

## 2014-11-14 ENCOUNTER — Telehealth: Payer: Self-pay | Admitting: Family

## 2014-11-14 ENCOUNTER — Ambulatory Visit (INDEPENDENT_AMBULATORY_CARE_PROVIDER_SITE_OTHER)
Admission: RE | Admit: 2014-11-14 | Discharge: 2014-11-14 | Disposition: A | Discharge status: Home / Self Care | Payer: Medicare Other | Source: Ambulatory Visit | Attending: Family | Admitting: Family

## 2014-11-14 ENCOUNTER — Other Ambulatory Visit (INDEPENDENT_AMBULATORY_CARE_PROVIDER_SITE_OTHER): Payer: Medicare Other

## 2014-11-14 ENCOUNTER — Encounter: Payer: Self-pay | Admitting: Family

## 2014-11-14 VITALS — BP 108/78 | HR 82 | Temp 98.3°F | Resp 18 | Ht 63.0 in | Wt 171.0 lb

## 2014-11-14 DIAGNOSIS — E038 Other specified hypothyroidism: Secondary | ICD-10-CM

## 2014-11-14 DIAGNOSIS — Z23 Encounter for immunization: Secondary | ICD-10-CM

## 2014-11-14 DIAGNOSIS — E119 Type 2 diabetes mellitus without complications: Secondary | ICD-10-CM | POA: Diagnosis not present

## 2014-11-14 DIAGNOSIS — E034 Atrophy of thyroid (acquired): Secondary | ICD-10-CM

## 2014-11-14 DIAGNOSIS — F319 Bipolar disorder, unspecified: Secondary | ICD-10-CM

## 2014-11-14 DIAGNOSIS — M25511 Pain in right shoulder: Secondary | ICD-10-CM

## 2014-11-14 DIAGNOSIS — G4700 Insomnia, unspecified: Secondary | ICD-10-CM

## 2014-11-14 LAB — BASIC METABOLIC PANEL
BUN: 16 mg/dL (ref 6–23)
CALCIUM: 9.7 mg/dL (ref 8.4–10.5)
CHLORIDE: 102 meq/L (ref 96–112)
CO2: 30 meq/L (ref 19–32)
Creatinine, Ser: 0.65 mg/dL (ref 0.40–1.20)
GFR: 97.02 mL/min (ref >60.00)
Glucose, Bld: 89 mg/dL (ref 70–99)
Potassium: 4 mEq/L (ref 3.5–5.1)
SODIUM: 142 meq/L (ref 135–145)

## 2014-11-14 LAB — TSH: TSH: 2.05 u[IU]/mL (ref 0.35–4.50)

## 2014-11-14 NOTE — Progress Notes (Signed)
Subjective:    Patient ID: Kelsey Hanna, female    DOB: 23-Aug-1950, 64 y.o.   MRN: [id number]  Chief Complaint  Patient presents with  . Establish Care    has right shoulder pain that radiates to the back rib cage, has difficulty moving right arm and has weakness, has issues with sleeping already takes trazodone 150 mg, left ankle pain    HPI:  Kelsey Hanna is a 64 y.o. female who  has a past medical history of Bipolar 1 disorder; Hypothyroid; Dyslipidemia; Retinal detachment; Sleep apnea; Diabetes mellitus; Cancer; Chicken pox; and Depression. and presents today for an office visit to establish care.   1.) Shoulder pain - Associated symptom of pain located in her right shoulder has been going on for 4-6 months when she was walking she walked into a door jam. Pain is described as sharp, dull and achy and does not ever leave. Also notes that that her her arm feels numb and tingly. Severity of the pain 10/10. Modifying treatments include no treatments. [facility] has been managing her back pain.   2.) Insomnia - Currently managed on trazodone. She has been followed by [facility] who increased her medications to 150 mg nightly as needed for sleep.  3.) Diabetes - Currently maintained on metformin. Takes the medciations as prescribed and denies adverse side effects. Does not currently check her blood sugars. Does not recall recent eye exam. Previously on lisinopril which has been discontinued. Covered with atorvastatin for cholesterol. Due for Prevnar vaccination.  Lab Results  Component Value Date   HGBA1C 5.9* 09/06/2014    4.) Thyroid - Currently maintainined on levothyroixine with her last TSH being 0.214.  Lab Results  Component Value Date   TSH 2.05 11/14/2014    Allergies  Allergen Reactions  . Haldol [Haloperidol] Other (See Comments)    Shaking of the hands, grinding her teeth  . Lithium Other (See Comments)    "makes me feel spaced out" slurred  speech     Outpatient Prescriptions Prior to Visit  Medication Sig Dispense Refill  . aspirin 325 MG EC tablet Take 1 tablet (325 mg total) by mouth daily. For heart health 30 tablet 0  . atorvastatin (LIPITOR) 40 MG tablet Take 1 tablet (40 mg total) by mouth every evening. For high cholesterol (Patient taking differently: Take 40 mg by mouth daily. For high cholesterol)    . colchicine 0.6 MG tablet Take 0.6 mg by mouth every morning.    . divalproex (DEPAKOTE ER) 500 MG 24 hr tablet Take 1 tablet (500 mg total) by mouth daily. (Patient taking differently: Take 1,000 mg by mouth at bedtime. ) 10 tablet 0  . fluticasone (FLONASE) 50 MCG/ACT nasal spray Place 2 sprays into both nostrils 2 (two) times daily. For allergies  2  . furosemide (LASIX) 40 MG tablet Take 40 mg by mouth daily.    Marland Kitchen levothyroxine (SYNTHROID) 137 MCG tablet Take 137 mcg by mouth daily before breakfast.    . metFORMIN (GLUCOPHAGE) 500 MG tablet Take 1 tablet (500 mg total) by mouth daily with breakfast. For diabetes mellitus (Patient taking differently: Take 500 mg by mouth 2 (two) times daily with a meal. For diabetes mellitus) 30 tablet 0  . metoprolol (LOPRESSOR) 50 MG tablet Take 1 tablet (50 mg total) by mouth 2 (two) times daily with a meal. For high blood pressure 60 tablet 0  . cyclobenzaprine (FLEXERIL) 10 MG tablet Take 1 tablet (10 mg total)  by mouth 3 (three) times daily as needed for muscle spasms. 30 tablet 0  . diclofenac sodium (VOLTAREN) 1 % GEL Apply 1 application topically daily. For arthritic pain    . DULoxetine (CYMBALTA) 30 MG capsule Take 1 capsule (30 mg total) by mouth daily. 10 capsule 0  . gabapentin (NEURONTIN) 300 MG capsule Take 1 capsule (300 mg total) by mouth 3 (three) times daily. 30 capsule -  . ibuprofen (ADVIL,MOTRIN) 200 MG tablet Take 200 mg by mouth every 6 (six) hours as needed for moderate pain.    Marland Kitchen lisinopril (PRINIVIL,ZESTRIL) 5 MG tablet Take 1 tablet (5 mg total) by mouth  daily. For high blood pressure 30 tablet 0  . LORazepam (ATIVAN) 0.5 MG tablet Take 1 tablet (0.5 mg total) by mouth 2 (two) times daily. For severe anxiety 30 tablet 0  . meclizine (ANTIVERT) 12.5 MG tablet Take 1 tablet (12.5 mg total) by mouth 3 (three) times daily as needed for dizziness. 20 tablet 0  . meloxicam (MOBIC) 7.5 MG tablet Take 1 tablet (7.5 mg total) by mouth daily. 20 tablet 0  . naproxen sodium (ANAPROX) 220 MG tablet Take 220 mg by mouth 2 (two) times daily as needed (pain).    . nicotine polacrilex (NICORETTE) 2 MG gum Take 1 each (2 mg total) by mouth as needed for smoking cessation. 100 tablet 0  . potassium chloride (K-DUR) 10 MEQ tablet Take 20 mEq by mouth every morning.    . potassium chloride 20 MEQ TBCR Take 20 mEq by mouth daily. For low potassium 15 tablet 0  . QUEtiapine (SEROQUEL) 100 MG tablet Take 1 tablet (100 mg total) by mouth 2 (two) times daily. 20 tablet 0  . zolpidem (AMBIEN) 10 MG tablet Take 10 mg by mouth at bedtime as needed for sleep.     No facility-administered medications prior to visit.     Past Medical History  Diagnosis Date  . Bipolar 1 disorder   . Hypothyroid   . Dyslipidemia   . Retinal detachment   . Sleep apnea   . Diabetes mellitus     dx within last yr....just takes pills  . Cancer     cancer of uterus...no chemo or radiation  . Chicken pox   . Depression      Past Surgical History  Procedure Laterality Date  . Abdominal hysterectomy    . Total abdominal hysterectomy w/ bilateral salpingoophorectomy    . Tonsillectomy    . Lumbar laminectomy/decompression microdiscectomy  07/17/2011    Procedure: LUMBAR LAMINECTOMY/DECOMPRESSION MICRODISCECTOMY;  Surgeon: Sinclair Ship, MD;  Location: [place];  Service: Orthopedics;  Laterality: Left;  Left sided lumbar 4-5 microdisectomy     Family History  Problem Relation Age of Onset  . Healthy Mother   . Healthy Father      Social History   Social History  .  Marital Status: Divorced    Spouse Name: N/A  . Number of Children: 2  . Years of Education: 12   Occupational History  . Disability    Social History Main Topics  . Smoking status: Current Every Day Smoker -- 0.25 packs/day for 35 years    Types: Cigarettes    Last Attempt to Quit: 07/09/1999  . Smokeless tobacco: Not on file  . Alcohol Use: No     Comment: socially  . Drug Use: No  . Sexual Activity: Not on file   Other Topics Concern  . Not on file   Social  History Narrative   Denies abuse and feels safe at home.     Review of Systems  Constitutional: Negative for fever and chills.  Respiratory: Negative for cough, chest tightness and shortness of breath.   Endocrine: Negative for cold intolerance, heat intolerance, polydipsia, polyphagia and polyuria.  Musculoskeletal:       Positive for shoulder pain.       Objective:    BP 108/78 mmHg  Pulse 82  Temp(Src) 98.3 F (36.8 C) (Oral)  Resp 18  Ht 5\' 3"  (1.6 m)  Wt 171 lb (77.565 kg)  BMI 30.30 kg/m2  SpO2 97% Nursing note and vital signs reviewed.  Physical Exam  Constitutional: She is oriented to person, place, and time. She appears well-developed and well-nourished. No distress.  Cardiovascular: Normal rate, regular rhythm, normal heart sounds and intact distal pulses.   Pulmonary/Chest: Effort normal and breath sounds normal.  Musculoskeletal:  Right shoulder with no obvious deformity, discoloration, or edema noted. Tenderness noted of supraspinatus tendon and infraspinatus muscle belly. Significantly decreased shoulder range of motion in flexion and abduction greater than 90. Unable to assess passive range of motion secondary to patient pain/lack of cooperation. Distal pulses, sensation, and reflexes are intact and appropriate. Empty can is positive. Neer's impingement results in pain.  Neurological: She is alert and oriented to person, place, and time.  Skin: Skin is warm and dry.  Psychiatric: She has a  normal mood and affect. Her behavior is normal. Judgment and thought content normal.       Assessment & Plan:   Problem List Items Addressed This Visit      Endocrine   Type 2 diabetes mellitus - Primary    Type 2 diabetes well controlled with current regimen of metformin and no adverse side effects or hypoglycemia. Overdue for eye exam. Referral to opthalmology placed. Previously maintained on lisinopril however discontinued for unknown reason. Cholesterol is treated with atorvastatin. Diabetic foot exam at next office visit. Continue current dosage of metformin pending A1c results.      Relevant Orders   Basic Metabolic Panel (BMET) (Completed)   Ambulatory referral to Ophthalmology   Hypothyroidism    Hypothyroidism currently maintained on levothyroxine with previous TSH slightly low. Obtain TSH to check current status. Denies ever side effects. Continue current dosage of levothyroxine pending TSH results.      Relevant Orders   TSH (Completed)     Other   Right shoulder pain    Right shoulder pain of undetermined origin, however cannot rule out impingement or possible rotator cuff tear secondary to decreased range of motion. Obtain shoulder x-ray. Pain is disproportional to current physical exam. Treat conservatively at this time with ice/heat as needed and initiate home exercise therapy program. Follow-up in 2 weeks after initiation of therapy to determine need for additional imaging and/or therapy.      Relevant Orders   DG Shoulder Right (Completed)   Insomnia    Recently increased trazodone by psychiatry. Patient reports continued difficulty with sleeping. Continue current dosage of trazodone and follow up with psychiatry as needed.

## 2014-11-14 NOTE — Assessment & Plan Note (Signed)
Recently increased trazodone by psychiatry. Patient reports continued difficulty with sleeping. Continue current dosage of trazodone and follow up with psychiatry as needed.

## 2014-11-14 NOTE — Patient Instructions (Signed)
Thank you for choosing [facility].  Summary/Instructions:  Please stop by the lab on the basement level of the building for your blood work. Your results will be released to [facility] (or called to you) after review, usually within 72 hours after test completion. If any changes need to be made, you will be notified at that same time.  Please stop by radiology on the basement level of the building for your x-rays. Your results will be released to [facility] (or called to you) after review, usually within 72 hours after test completion. If any treatments or changes are necessary, you will be notified at that same time.  If your symptoms worsen or fail to improve, please contact our office for further instruction, or in case of emergency go directly to the emergency room at the closest medical facility.   Shoulder Exercises EXERCISES  RANGE OF MOTION (ROM) AND STRETCHING EXERCISES These exercises may help you when beginning to rehabilitate your injury. Your symptoms may resolve with or without further involvement from your physician, physical therapist or athletic trainer. While completing these exercises, remember:   Restoring tissue flexibility helps normal motion to return to the joints. This allows healthier, less painful movement and activity.  An effective stretch should be held for at least 30 seconds.  A stretch should never be painful. You should only feel a gentle lengthening or release in the stretched tissue. ROM - Pendulum  Bend at the waist so that your right / left arm falls away from your body. Support yourself with your opposite hand on a solid surface, such as a table or a countertop.  Your right / left arm should be perpendicular to the ground. If it is not perpendicular, you need to lean over farther. Relax the muscles in your right / left arm and shoulder as much as possible.  Gently sway your hips and trunk so they move your right / left arm without any use of your  right / left shoulder muscles.  Progress your movements so that your right / left arm moves side to side, then forward and backward, and finally, both clockwise and counterclockwise.  Complete __________ repetitions in each direction. Many people use this exercise to relieve discomfort in their shoulder as well as to gain range of motion. Repeat __________ times. Complete this exercise __________ times per day. STRETCH - Flexion, Standing  Stand with good posture. With an underhand grip on your right / left hand and an overhand grip on the opposite hand, grasp a broomstick or cane so that your hands are a little more than shoulder-width apart.  Keeping your right / left elbow straight and shoulder muscles relaxed, push the stick with your opposite hand to raise your right / left arm in front of your body and then overhead. Raise your arm until you feel a stretch in your right / left shoulder, but before you have increased shoulder pain.  Try to avoid shrugging your right / left shoulder as your arm rises by keeping your shoulder blade tucked down and toward your mid-back spine. Hold __________ seconds.  Slowly return to the starting position. Repeat __________ times. Complete this exercise __________ times per day. STRETCH - Internal Rotation  Place your right / left hand behind your back, palm-up.  Throw a towel or belt over your opposite shoulder. Grasp the towel/belt with your right / left hand.  While keeping an upright posture, gently pull up on the towel/belt until you feel a stretch in the  front of your right / left shoulder.  Avoid shrugging your right / left shoulder as your arm rises by keeping your shoulder blade tucked down and toward your mid-back spine.  Hold __________. Release the stretch by lowering your opposite hand. Repeat __________ times. Complete this exercise __________ times per day. STRETCH - External Rotation and Abduction  Stagger your stance through a  doorframe. It does not matter which foot is forward.  As instructed by your physician, physical therapist or athletic trainer, place your hands:  And forearms above your head and on the door frame.  And forearms at head-height and on the door frame.  At elbow-height and on the door frame.  Keeping your head and chest upright and your stomach muscles tight to prevent over-extending your low-back, slowly shift your weight onto your front foot until you feel a stretch across your chest and/or in the front of your shoulders.  Hold __________ seconds. Shift your weight to your back foot to release the stretch. Repeat __________ times. Complete this stretch __________ times per day.  STRENGTHENING EXERCISES  These exercises may help you when beginning to rehabilitate your injury. They may resolve your symptoms with or without further involvement from your physician, physical therapist or athletic trainer. While completing these exercises, remember:   Muscles can gain both the endurance and the strength needed for everyday activities through controlled exercises.  Complete these exercises as instructed by your physician, physical therapist or athletic trainer. Progress the resistance and repetitions only as guided.  You may experience muscle soreness or fatigue, but the pain or discomfort you are trying to eliminate should never worsen during these exercises. If this pain does worsen, stop and make certain you are following the directions exactly. If the pain is still present after adjustments, discontinue the exercise until you can discuss the trouble with your clinician.  If advised by your physician, during your recovery, avoid activity or exercises which involve actions that place your right / left hand or elbow above your head or behind your back or head. These positions stress the tissues which are trying to heal. STRENGTH - Scapular Depression and Adduction  With good posture, sit on a firm  chair. Supported your arms in front of you with pillows, arm rests or a table top. Have your elbows in line with the sides of your body.  Gently draw your shoulder blades down and toward your mid-back spine. Gradually increase the tension without tensing the muscles along the top of your shoulders and the back of your neck.  Hold for __________ seconds. Slowly release the tension and relax your muscles completely before completing the next repetition.  After you have practiced this exercise, remove the arm support and complete it in standing as well as sitting. Repeat __________ times. Complete this exercise __________ times per day.  STRENGTH - External Rotators  Secure a rubber exercise band/tubing to a fixed object so that it is at the same height as your right / left elbow when you are standing or sitting on a firm surface.  Stand or sit so that the secured exercise band/tubing is at your side that is not injured.  Bend your elbow 90 degrees. Place a folded towel or small pillow under your right / left arm so that your elbow is a few inches away from your side.  Keeping the tension on the exercise band/tubing, pull it away from your body, as if pivoting on your elbow. Be sure to keep your body  steady so that the movement is only coming from your shoulder rotating.  Hold __________ seconds. Release the tension in a controlled manner as you return to the starting position. Repeat __________ times. Complete this exercise __________ times per day.  STRENGTH - Supraspinatus  Stand or sit with good posture. Grasp a __________ weight or an exercise band/tubing so that your hand is "thumbs-up," like when you shake hands.  Slowly lift your right / left hand from your thigh into the air, traveling about 30 degrees from straight out at your side. Lift your hand to shoulder height or as far as you can without increasing any shoulder pain. Initially, many people do not lift their hands above shoulder  height.  Avoid shrugging your right / left shoulder as your arm rises by keeping your shoulder blade tucked down and toward your mid-back spine.  Hold for __________ seconds. Control the descent of your hand as you slowly return to your starting position. Repeat __________ times. Complete this exercise __________ times per day.  STRENGTH - Shoulder Extensors  Secure a rubber exercise band/tubing so that it is at the height of your shoulders when you are either standing or sitting on a firm arm-less chair.  With a thumbs-up grip, grasp an end of the band/tubing in each hand. Straighten your elbows and lift your hands straight in front of you at shoulder height. Step back away from the secured end of band/tubing until it becomes tense.  Squeezing your shoulder blades together, pull your hands down to the sides of your thighs. Do not allow your hands to go behind you.  Hold for __________ seconds. Slowly ease the tension on the band/tubing as you reverse the directions and return to the starting position. Repeat __________ times. Complete this exercise __________ times per day.  STRENGTH - Scapular Retractors  Secure a rubber exercise band/tubing so that it is at the height of your shoulders when you are either standing or sitting on a firm arm-less chair.  With a palm-down grip, grasp an end of the band/tubing in each hand. Straighten your elbows and lift your hands straight in front of you at shoulder height. Step back away from the secured end of band/tubing until it becomes tense.  Squeezing your shoulder blades together, draw your elbows back as you bend them. Keep your upper arm lifted away from your body throughout the exercise.  Hold __________ seconds. Slowly ease the tension on the band/tubing as you reverse the directions and return to the starting position. Repeat __________ times. Complete this exercise __________ times per day. STRENGTH - Scapular Depressors  Find a sturdy  chair without wheels, such as a from a dining room table.  Keeping your feet on the floor, lift your bottom from the seat and lock your elbows.  Keeping your elbows straight, allow gravity to pull your body weight down. Your shoulders will rise toward your ears.  Raise your body against gravity by drawing your shoulder blades down your back, shortening the distance between your shoulders and ears. Although your feet should always maintain contact with the floor, your feet should progressively support less body weight as you get stronger.  Hold __________ seconds. In a controlled and slow manner, lower your body weight to begin the next repetition. Repeat __________ times. Complete this exercise __________ times per day.  Document Released: 12/18/2004 Document Revised: 04/28/2011 Document Reviewed: 05/18/2008 [facility] Patient Information 2015 [place], [place]. This information is not intended to replace advice given to you by  your health care provider. Make sure you discuss any questions you have with your health care provider.  

## 2014-11-14 NOTE — Progress Notes (Signed)
Pre visit review using our clinic review tool, if applicable. No additional management support is needed unless otherwise documented below in the visit note. 

## 2014-11-14 NOTE — Assessment & Plan Note (Signed)
Right shoulder pain of undetermined origin, however cannot rule out impingement or possible rotator cuff tear secondary to decreased range of motion. Obtain shoulder x-ray. Pain is disproportional to current physical exam. Treat conservatively at this time with ice/heat as needed and initiate home exercise therapy program. Follow-up in 2 weeks after initiation of therapy to determine need for additional imaging and/or therapy.

## 2014-11-14 NOTE — Assessment & Plan Note (Signed)
Type 2 diabetes well controlled with current regimen of metformin and no adverse side effects or hypoglycemia. Overdue for eye exam. Referral to opthalmology placed. Previously maintained on lisinopril however discontinued for unknown reason. Cholesterol is treated with atorvastatin. Diabetic foot exam at next office visit. Continue current dosage of metformin pending A1c results.

## 2014-11-14 NOTE — Assessment & Plan Note (Signed)
Hypothyroidism currently maintained on levothyroxine with previous TSH slightly low. Obtain TSH to check current status. Denies ever side effects. Continue current dosage of levothyroxine pending TSH results.

## 2014-11-14 NOTE — Telephone Encounter (Signed)
Please inform patient that her blood work showed that her kidney function is normal and her TSH is stable with her current dosage of levothyroxine. Therefore please continue her current thyroid medication and follow up in 6 months for recheck of TSH. Her x-ray showed some mild arthritis, however no fracture or structural abnormality. Therefore please continue with the exercises and follow up as discussed.

## 2014-11-15 NOTE — Telephone Encounter (Signed)
Sending results in the mail. 

## 2014-11-15 NOTE — Telephone Encounter (Signed)
Pt aware of results 

## 2014-11-16 ENCOUNTER — Telehealth: Payer: Self-pay | Admitting: *Deleted

## 2014-11-16 MED ORDER — ATORVASTATIN CALCIUM 40 MG PO TABS: 40.0000 mg | ORAL_TABLET | Freq: Every evening | ORAL | Status: DC

## 2014-11-16 MED ORDER — TRAZODONE HCL 150 MG PO TABS: 150.0000 mg | ORAL_TABLET | Freq: Every day | ORAL | Status: DC

## 2014-11-16 MED ORDER — METOPROLOL TARTRATE 50 MG PO TABS: 50.0000 mg | ORAL_TABLET | Freq: Two times a day (BID) | ORAL | Status: DC

## 2014-11-16 MED ORDER — COLCHICINE 0.6 MG PO TABS: 0.6000 mg | ORAL_TABLET | Freq: Every morning | ORAL | Status: DC

## 2014-11-16 MED ORDER — FUROSEMIDE 40 MG PO TABS: 40.0000 mg | ORAL_TABLET | Freq: Every day | ORAL | Status: DC

## 2014-11-16 MED ORDER — THIAMINE HCL 100 MG PO TABS: 100.0000 mg | ORAL_TABLET | Freq: Every day | ORAL | Status: DC

## 2014-11-16 MED ORDER — METFORMIN HCL 500 MG PO TABS: 500.0000 mg | ORAL_TABLET | Freq: Every day | ORAL | Status: DC

## 2014-11-16 MED ORDER — LEVOTHYROXINE SODIUM 137 MCG PO TABS: 137.0000 ug | ORAL_TABLET | Freq: Every day | ORAL | Status: DC

## 2014-11-16 MED ORDER — FLUTICASONE PROPIONATE 50 MCG/ACT NA SUSP: 2.0000 | Freq: Two times a day (BID) | NASAL | Status: DC

## 2014-11-16 MED ORDER — ASPIRIN 325 MG PO TBEC
325.0000 mg | DELAYED_RELEASE_TABLET | Freq: Every day | ORAL | Status: DC
Start: 2014-11-16 — End: 2015-03-20

## 2014-11-16 MED ORDER — DIVALPROEX SODIUM ER 500 MG PO TB24: 500.0000 mg | ORAL_TABLET | Freq: Every day | ORAL | Status: DC

## 2014-11-16 NOTE — Telephone Encounter (Signed)
Receive call from [facility] stating pt is needing refills sent to [facility]. She is now staying at [facility] that is where they get their medicine from. Verified all meds that are rx by PCP inform will send to [place]...Johny Chess

## 2014-11-17 ENCOUNTER — Telehealth: Payer: Self-pay

## 2014-11-17 ENCOUNTER — Telehealth: Payer: Self-pay | Admitting: Family

## 2014-11-17 DIAGNOSIS — G8929 Other chronic pain: Secondary | ICD-10-CM

## 2014-11-17 MED ORDER — OXYCODONE-ACETAMINOPHEN 5-325 MG PO TABS: 1.0000 | ORAL_TABLET | Freq: Two times a day (BID) | ORAL | Status: DC | PRN

## 2014-11-17 NOTE — Addendum Note (Signed)
Addended by: Mauricio Po D on: 11/17/2014 12:19 PM   Modules accepted: Orders

## 2014-11-17 NOTE — Telephone Encounter (Signed)
Medication printed and signed for pickup.

## 2014-11-17 NOTE — Telephone Encounter (Signed)
[name] from the nursing home called and said that when Pt was out living on her own, she miss appt with pain management office and now they have dismissed her.  She wants to know what she needs to do about the pt meds.  She does not have anymore.  Can she be referred to a different pain management clinic?    Best number [name] [phone]

## 2014-11-17 NOTE — Telephone Encounter (Signed)
A referral to pain management has been placed. She will need to obtain her old records from her former pain clinic for the new pain clinic.

## 2014-11-17 NOTE — Addendum Note (Signed)
Addended by: Mauricio Po D on: 11/17/2014 04:32 PM   Modules accepted: Orders

## 2014-11-17 NOTE — Telephone Encounter (Signed)
Pt aware.

## 2014-11-17 NOTE — Telephone Encounter (Signed)
Oxycodone 5-325mg  will you be willing to fill this until she gets into pain clinic?

## 2014-12-11 ENCOUNTER — Telehealth: Payer: Self-pay | Admitting: Family

## 2014-12-11 NOTE — Telephone Encounter (Signed)
[name] from nursing home called in and wanted to speak with you Lovena Le about pt.  Her number is [phone]

## 2014-12-11 NOTE — Telephone Encounter (Signed)
Spoke with Kelsey Hanna let her know that a medical records form has been faxed over the assisted living facility the pt stays at and we are waiting for her to sign and fax back for records from previous pain doctor to send a new referral to another pain management clinic. I did make her aware that it could take 2-3 months to get an appointment into a pain clinic if she is accepted and she is wanting to know if you could fill the pts pain medicine in the mean time until she hears about the pain clinic referral. Please advise.

## 2014-12-11 NOTE — Telephone Encounter (Signed)
Yes, we can refill the pain medications until we hear back from pain management.

## 2014-12-13 ENCOUNTER — Telehealth: Payer: Self-pay | Admitting: Family

## 2014-12-13 MED ORDER — METFORMIN HCL 500 MG PO TABS: ORAL_TABLET | ORAL | Status: DC

## 2014-12-13 NOTE — Telephone Encounter (Signed)
[name] from [facility] called regarding pts prescription for metFORMIN (GLUCOPHAGE) 500 MG tablet [[id number]]   She states pt takes 2 a day. One in morning and one in evening.  She needs new prescription sent to [facility] stating this

## 2014-12-13 NOTE — Telephone Encounter (Signed)
Rx has been changed and sent 

## 2014-12-15 NOTE — Telephone Encounter (Signed)
Medication list sent to [name] for pt. Per [name]

## 2014-12-15 NOTE — Telephone Encounter (Signed)
They are aware

## 2014-12-21 ENCOUNTER — Telehealth: Payer: Self-pay | Admitting: Family

## 2014-12-21 MED ORDER — OXYCODONE-ACETAMINOPHEN 5-325 MG PO TABS: 1.0000 | ORAL_TABLET | Freq: Two times a day (BID) | ORAL | Status: DC | PRN

## 2014-12-21 NOTE — Telephone Encounter (Signed)
Kelsey Hanna is aware and states it might not be until Monday when it is picked up. 11/7

## 2014-12-21 NOTE — Telephone Encounter (Signed)
Last refill was 9/30 please advise.

## 2014-12-21 NOTE — Telephone Encounter (Signed)
This needs to be picked up. Medication printed.

## 2014-12-21 NOTE — Telephone Encounter (Signed)
[name] called from assisted living to request oxcycodone to be send to [facility]. Please call her back once its done.  Phone # [phone]

## 2015-01-16 ENCOUNTER — Telehealth: Payer: Self-pay | Admitting: Family

## 2015-01-16 NOTE — Telephone Encounter (Signed)
States patient is out of metformin, multi vit, and Folic Acid.  Needs refills sent to [facility].    States patient is not scheduled to come back until April.  Would like to know if she will have to keep calling the meds in?  States patient has several medications that have only two refills left.  Would like follow up call to review medication refills.

## 2015-01-17 MED ORDER — METFORMIN HCL 500 MG PO TABS: ORAL_TABLET | ORAL | Status: DC

## 2015-01-17 MED ORDER — FOLIC ACID 1 MG PO TABS: 1.0000 mg | ORAL_TABLET | Freq: Every day | ORAL | Status: DC

## 2015-01-17 MED ORDER — THERA VITAL M PO TABS: 1.0000 | ORAL_TABLET | Freq: Every day | ORAL | Status: DC

## 2015-01-17 NOTE — Telephone Encounter (Signed)
Called pt back and got medications and refills straightened out.

## 2015-01-18 ENCOUNTER — Telehealth: Payer: Self-pay | Admitting: Family

## 2015-01-18 DIAGNOSIS — H547 Unspecified visual loss: Secondary | ICD-10-CM

## 2015-01-18 NOTE — Telephone Encounter (Signed)
[facility] is calling to speak with you. Please call ASAP

## 2015-01-18 NOTE — Telephone Encounter (Signed)
Spoke with Dr. Posey Pronto from [facility] and based on her exam there is concern about a potential stroke that may have occurred in the past that the patient never informed anyone of. She is performing a detailed exam on 12/2. Based on this information it is reasonable and medically appropriate to obtain an MRI to rule out any previous stroke.

## 2015-01-29 ENCOUNTER — Telehealth: Payer: Self-pay | Admitting: Family

## 2015-01-29 MED ORDER — OXYCODONE-ACETAMINOPHEN 5-325 MG PO TABS: 1.0000 | ORAL_TABLET | Freq: Two times a day (BID) | ORAL | Status: DC | PRN

## 2015-01-29 NOTE — Telephone Encounter (Signed)
Medication printed and available for pick up.  

## 2015-01-29 NOTE — Telephone Encounter (Signed)
Last refill was 11/3. Please advise

## 2015-01-29 NOTE — Telephone Encounter (Signed)
[name] from [facility] requesting refill for oxyCODONE-acetaminophen (ROXICET) 5-325 MG tablet [id number] for pt. She states she needs this asap.

## 2015-01-30 NOTE — Telephone Encounter (Signed)
[name] informed

## 2015-02-01 ENCOUNTER — Encounter: Payer: Self-pay | Admitting: Family

## 2015-02-01 ENCOUNTER — Other Ambulatory Visit (INDEPENDENT_AMBULATORY_CARE_PROVIDER_SITE_OTHER): Payer: Medicare Other

## 2015-02-01 ENCOUNTER — Ambulatory Visit (INDEPENDENT_AMBULATORY_CARE_PROVIDER_SITE_OTHER): Payer: Medicare Other | Admitting: Family

## 2015-02-01 VITALS — BP 108/72 | HR 60 | Temp 97.4°F | Resp 14 | Ht 63.0 in | Wt 189.8 lb

## 2015-02-01 DIAGNOSIS — E118 Type 2 diabetes mellitus with unspecified complications: Secondary | ICD-10-CM

## 2015-02-01 DIAGNOSIS — E038 Other specified hypothyroidism: Secondary | ICD-10-CM | POA: Diagnosis not present

## 2015-02-01 DIAGNOSIS — F3110 Bipolar disorder, current episode manic without psychotic features, unspecified: Secondary | ICD-10-CM

## 2015-02-01 DIAGNOSIS — Z5181 Encounter for therapeutic drug level monitoring: Secondary | ICD-10-CM | POA: Diagnosis not present

## 2015-02-01 DIAGNOSIS — I10 Essential (primary) hypertension: Secondary | ICD-10-CM | POA: Diagnosis not present

## 2015-02-01 DIAGNOSIS — E034 Atrophy of thyroid (acquired): Secondary | ICD-10-CM

## 2015-02-01 DIAGNOSIS — H548 Legal blindness, as defined in USA: Secondary | ICD-10-CM

## 2015-02-01 LAB — BASIC METABOLIC PANEL
BUN: 28 mg/dL — AB (ref 6–23)
CALCIUM: 10 mg/dL (ref 8.4–10.5)
CO2: 32 mEq/L (ref 19–32)
Chloride: 98 mEq/L (ref 96–112)
Creatinine, Ser: 0.78 mg/dL (ref 0.40–1.20)
GFR: 78.56 mL/min (ref >60.00)
GLUCOSE: 110 mg/dL — AB (ref 70–99)
POTASSIUM: 4 meq/L (ref 3.5–5.1)
SODIUM: 139 meq/L (ref 135–145)

## 2015-02-01 LAB — LIPID PANEL
CHOL/HDL RATIO: 2
CHOLESTEROL: 147 mg/dL (ref 0–200)
HDL: 65.9 mg/dL (ref >39.00)
LDL CALC: 58 mg/dL (ref 0–99)
NonHDL: 80.71
Triglycerides: 113 mg/dL (ref 0.0–149.0)
VLDL: 22.6 mg/dL (ref 0.0–40.0)

## 2015-02-01 LAB — HEMOGLOBIN A1C: HEMOGLOBIN A1C: 6 % (ref 4.6–6.5)

## 2015-02-01 MED ORDER — FLUTICASONE PROPIONATE 50 MCG/ACT NA SUSP: 2.0000 | Freq: Every day | NASAL | Status: DC

## 2015-02-01 NOTE — Assessment & Plan Note (Signed)
Stable with no adverse symptoms. Obtain TSH. Continue current dosage of levothyroxine pending TSH results.

## 2015-02-01 NOTE — Patient Instructions (Addendum)
Thank you for choosing [facility].  Summary/Instructions:  Your prescription(s) have been submitted to your pharmacy or been printed and provided for you. Please take as directed and contact our office if you believe you are having problem(s) with the medication(s) or have any questions.  Please stop by the lab on the basement level of the building for your blood work. Your results will be released to [place] (or called to you) after review, usually within 72 hours after test completion. If any changes need to be made, you will be notified at that same time.  If your symptoms worsen or fail to improve, please contact our office for further instruction, or in case of emergency go directly to the emergency room at the closest medical facility.   Please hold the metoprolol and monitor blood pressure.   Will follow up after receive results for A1c and TSH  to determine metformin and levothyroxine changes.   Change flonase to 2 sprays per nostril daily.  Follow up with pain management.

## 2015-02-01 NOTE — Progress Notes (Signed)
Subjective:    Patient ID: Kelsey Hanna, female    DOB: 08/22/1950, 64 y.o.   MRN: [id number]  Chief Complaint  Patient presents with  . Follow-up    needs A1c checked, quetions about metformin and metoprolol    HPI:  Kelsey Hanna is a 64 y.o. female who  has a past medical history of Bipolar 1 disorder ([place]); Hypothyroid; Dyslipidemia; Retinal detachment; Sleep apnea; Diabetes mellitus; Cancer ([place]); Chicken pox; and Depression. and presents today for a follow up office visit.   1.) Type 2 diabetes - Currently maintained on metformin. Due for an A1c. Takes the medication as prescribed and denies adverse side effects. Does not currently take her blood sugar at home.   Lab Results  Component Value Date   HGBA1C 6.0 02/01/2015    2.) Hypertension - Currently maintained on furosemide and metoprolol. Takes the medication as prescribed and denies adverse effects. There is some concern with hypotensive readings during blood pressure at home.  Wt Readings from Last 3 Encounters:  02/01/15 189 lb 12.8 oz (86.093 kg)  11/14/14 171 lb (77.565 kg)  10/11/14 163 lb (73.936 kg)   3.) Hypothyroidism - Currently maintained on levothyroxine and takes the medication as prescribed and denies adverse side effects.   Lab Results  Component Value Date   TSH 2.05 11/14/2014    4.) Need for home health - Describes that she will be moving out of her current group home to an independent apartment and is in the need of assistance in completing her activities of daily living as she was declared legally blind. She has had service in the past and is requesting to restart them up moving out of the current group home. She has several areas of need. Requires assistance in bathing including getting in and out of the tub and washing her back. Also for pericare on the days she does not take a bath. She needs assistance with meal cooking and preparation as she is unable to prepare meals herself. She also  requires assistance in getting the mail and doing light house keeping. Previous assistance also helped with the scheduling of appointments. Notes that she previously received daily CNA services.    Allergies  Allergen Reactions  . Haldol [Haloperidol] Other (See Comments)    Shaking of the hands, grinding her teeth  . Lithium Other (See Comments)    "makes me feel spaced out" slurred speech     Current Outpatient Prescriptions on File Prior to Visit  Medication Sig Dispense Refill  . aspirin 325 MG EC tablet Take 1 tablet (325 mg total) by mouth daily. For heart health 30 tablet 5  . atorvastatin (LIPITOR) 40 MG tablet Take 1 tablet (40 mg total) by mouth every evening. For high cholesterol 30 tablet 5  . colchicine 0.6 MG tablet Take 1 tablet (0.6 mg total) by mouth every morning. 30 tablet 5  . divalproex (DEPAKOTE ER) 500 MG 24 hr tablet Take 1 tablet (500 mg total) by mouth daily. 30 tablet 5  . folic acid (FOLVITE) 1 MG tablet Take 1 tablet (1 mg total) by mouth daily. 30 tablet 5  . furosemide (LASIX) 40 MG tablet Take 1 tablet (40 mg total) by mouth daily. 30 tablet 5  . levothyroxine (SYNTHROID) 137 MCG tablet Take 1 tablet (137 mcg total) by mouth daily before breakfast. 30 tablet 5  . metFORMIN (GLUCOPHAGE) 500 MG tablet Take 1 tablet (500 MG) by mouth 2 times daily 60 tablet  5  . Multiple Vitamins-Minerals (MULTIVITAMIN) tablet Take 1 tablet by mouth daily. 30 tablet 5  . OLANZapine (ZYPREXA) 10 MG tablet Take 10 mg by mouth at bedtime.    [facility] oxyCODONE-acetaminophen (ROXICET) 5-325 MG tablet Take 1 tablet by mouth 2 (two) times daily as needed for severe pain. 60 tablet 0  . thiamine 100 MG tablet Take 1 tablet (100 mg total) by mouth daily. 30 tablet 5  . traZODone (DESYREL) 150 MG tablet Take 1 tablet (150 mg total) by mouth at bedtime. 30 tablet 5  . zolpidem (AMBIEN) 10 MG tablet Take 10 mg by mouth at bedtime as needed for sleep.     No current facility-administered  medications on file prior to visit.     Past Surgical History  Procedure Laterality Date  . Abdominal hysterectomy    . Total abdominal hysterectomy w/ bilateral salpingoophorectomy    . Tonsillectomy    . Lumbar laminectomy/decompression microdiscectomy  07/17/2011    Procedure: LUMBAR LAMINECTOMY/DECOMPRESSION MICRODISCECTOMY;  Surgeon: Sinclair Ship, MD;  Location: [facility];  Service: Orthopedics;  Laterality: Left;  Left sided lumbar 4-5 microdisectomy    Past Medical History  Diagnosis Date  . Bipolar 1 disorder ([place])   . Hypothyroid   . Dyslipidemia   . Retinal detachment   . Sleep apnea   . Diabetes mellitus     dx within last yr....just takes pills  . Cancer [facility])     cancer of uterus...no chemo or radiation  . Chicken pox   . Depression      Review of Systems  Constitutional: Negative for fever and chills.  Eyes:       Denies changes in vision  Respiratory: Negative for chest tightness.   Cardiovascular: Negative for chest pain, palpitations and leg swelling.  Endocrine: Negative for cold intolerance, heat intolerance, polydipsia, polyphagia and polyuria.  Neurological: Negative for numbness.      Objective:    BP 108/72 mmHg  Pulse 60  Temp(Src) 97.4 F (36.3 C) (Oral)  Resp 14  Ht 5\' 3"  (1.6 m)  Wt 189 lb 12.8 oz (86.093 kg)  BMI 33.63 kg/m2  SpO2 94% Nursing note and vital signs reviewed.  Physical Exam  Constitutional: She is oriented to person, place, and time. She appears well-developed and well-nourished. No distress.  Cardiovascular: Normal rate, regular rhythm, normal heart sounds and intact distal pulses.   Pulmonary/Chest: Effort normal and breath sounds normal.  Neurological: She is alert and oriented to person, place, and time.  Skin: Skin is warm and dry.  Psychiatric: She has a normal mood and affect. Her behavior is normal. Judgment and thought content normal.       Assessment & Plan:   Problem List Items Addressed This  Visit      Cardiovascular and Mediastinum   Essential hypertension    Hypertension is well controlled and below goal of 140/90. Does have legal blindness with no other symptoms of end organ damage.  In a note from her group home, her blood pressure has always been in a good range. Discontinue metoprolol and monitor blood pressure at home. Continue current dosage of furosemide. Follow up in 3 months or sooner.       Relevant Orders   Basic Metabolic Panel (BMET) (Completed)     Endocrine   Type 2 diabetes mellitus ([place]) - Primary    Stable and maintained on metformin. Obtain A1c. Pending results consider holding metformin x 3 months to determine if can be  managed by lifestyle choices. Maintained on atorvastatin for CAD risk reduction. Due for foot exam and microalbumin at next office visit. Continue current dosage of metformin pending A1c results.       Relevant Orders   Hemoglobin A1c (Completed)   Lipid Profile (Completed)   Basic Metabolic Panel (BMET) (Completed)   Hypothyroidism    Stable with no adverse symptoms. Obtain TSH. Continue current dosage of levothyroxine pending TSH results.         Other   Legal blindness [place]    Previously declared legally blind and requires assistance with her activities of daily living including personal care, meal preparation and basic house care. Recommend home health CNA to assist with areas of deficiency upon moving out of her current group home. May also benefit from Meals on Wheels to help with meal preparation. Will refer to home health to establish service.        Other Visit Diagnoses    Encounter for therapeutic drug monitoring        Relevant Orders    Valproic Acid level       [facility]

## 2015-02-01 NOTE — Assessment & Plan Note (Signed)
Stable and maintained on metformin. Obtain A1c. Pending results consider holding metformin x 3 months to determine if can be managed by lifestyle choices. Maintained on atorvastatin for CAD risk reduction. Due for foot exam and microalbumin at next office visit. Continue current dosage of metformin pending A1c results.

## 2015-02-01 NOTE — Assessment & Plan Note (Addendum)
Hypertension is well controlled and below goal of 140/90. Does have legal blindness with no other symptoms of end organ damage.  In a note from her group home, her blood pressure has always been in a good range. Discontinue metoprolol and monitor blood pressure at home. Continue current dosage of furosemide. Follow up in 3 months or sooner.

## 2015-02-01 NOTE — Progress Notes (Signed)
Pre visit review using our clinic review tool, if applicable. No additional management support is needed unless otherwise documented below in the visit note. 

## 2015-02-01 NOTE — Assessment & Plan Note (Addendum)
Previously declared legally blind and requires assistance with her activities of daily living including personal care, meal preparation and basic house care. Recommend home health CNA to assist with areas of deficiency upon moving out of her current group home. May also benefit from Meals on Wheels to help with meal preparation. Will refer to home health to establish service.

## 2015-02-02 LAB — VALPROIC ACID LEVEL: Valproic Acid Lvl: 63.5 ug/mL (ref 50.0–100.0)

## 2015-02-05 ENCOUNTER — Telehealth: Payer: Self-pay | Admitting: Family

## 2015-02-05 ENCOUNTER — Ambulatory Visit
Admission: RE | Admit: 2015-02-05 | Discharge: 2015-02-05 | Disposition: A | Discharge status: Home / Self Care | Payer: Medicare Other | Source: Ambulatory Visit | Attending: Family | Admitting: Family

## 2015-02-05 DIAGNOSIS — H547 Unspecified visual loss: Secondary | ICD-10-CM

## 2015-02-05 NOTE — Telephone Encounter (Signed)
Please inform patient that her cholesterol, kidney function and electrolytes are all within the normal limits.  Please send her Valproic acid level to Karsten Fells at [facility] per patient request.

## 2015-02-06 ENCOUNTER — Telehealth: Payer: Self-pay | Admitting: Family

## 2015-02-06 NOTE — Telephone Encounter (Signed)
Please inform patient that her MRI was negative for any stroke with no significant abnormalities.

## 2015-02-07 NOTE — Telephone Encounter (Signed)
Called the number that's given and [name] that lady that works at the nursing home pt lives in answered. She states that the pt was not there and she was moving out next week. I tried to get a direct number for the pt to get more info on where to send her labs for. No direct number was known. Will try back later.

## 2015-02-23 ENCOUNTER — Telehealth: Payer: Self-pay | Admitting: Family

## 2015-02-23 ENCOUNTER — Telehealth: Payer: Self-pay | Admitting: Internal Medicine

## 2015-02-23 NOTE — Telephone Encounter (Signed)
Patient is requesting script for oxycodone and percocet.  States needs as soon as possible.

## 2015-02-23 NOTE — Telephone Encounter (Signed)
error 

## 2015-02-23 NOTE — Telephone Encounter (Signed)
Please advise 

## 2015-02-26 NOTE — Telephone Encounter (Signed)
Refill is not available until 03/01/15.

## 2015-02-27 ENCOUNTER — Encounter: Payer: Self-pay | Admitting: Family

## 2015-02-28 ENCOUNTER — Telehealth: Payer: Self-pay | Admitting: *Deleted

## 2015-02-28 MED ORDER — OXYCODONE-ACETAMINOPHEN 5-325 MG PO TABS: 1.0000 | ORAL_TABLET | Freq: Two times a day (BID) | ORAL | Status: DC | PRN

## 2015-02-28 NOTE — Telephone Encounter (Signed)
Error needing Oxycodone...Johny Chess

## 2015-02-28 NOTE — Telephone Encounter (Signed)
Left msg on triage needing refill on her hydrocodone...Johny Chess

## 2015-02-28 NOTE — Telephone Encounter (Signed)
Tried calling pt back # she left is not a working # called (H) # on file, and that is [employer] the lady who answer stated pt was there but she moved out on 12/28. Will place in cabinet for her to pick-up...Johny Chess

## 2015-02-28 NOTE — Telephone Encounter (Signed)
Medication refilled. Needs UDS please

## 2015-03-13 ENCOUNTER — Telehealth: Payer: Self-pay | Admitting: Family

## 2015-03-20 ENCOUNTER — Encounter: Payer: Self-pay | Admitting: Family

## 2015-03-20 ENCOUNTER — Ambulatory Visit (INDEPENDENT_AMBULATORY_CARE_PROVIDER_SITE_OTHER): Payer: Medicare Other | Admitting: Family

## 2015-03-20 VITALS — BP 112/78 | HR 80 | Temp 98.0°F | Resp 18 | Ht 63.0 in | Wt 203.8 lb

## 2015-03-20 DIAGNOSIS — G8929 Other chronic pain: Secondary | ICD-10-CM | POA: Diagnosis not present

## 2015-03-20 DIAGNOSIS — M549 Dorsalgia, unspecified: Secondary | ICD-10-CM | POA: Diagnosis not present

## 2015-03-20 MED ORDER — CYCLOBENZAPRINE HCL 5 MG PO TABS: 5.0000 mg | ORAL_TABLET | Freq: Three times a day (TID) | ORAL | Status: DC | PRN

## 2015-03-20 NOTE — Assessment & Plan Note (Signed)
Continues to experience chronic back pain. Pain referral sent and incomplete secondary to patient not having her photo ID. Recent drug screen was positive for Tramadol which has not been prescribed by this provider. Discussed follow up with the pain clinic to establish for long term management. Start Flexeril. Continue current Percocet pending pain management contact. Paperwork completed per patient request.

## 2015-03-20 NOTE — Patient Instructions (Addendum)
Thank you for choosing [facility].  Summary/Instructions:  Your prescription(s) have been submitted to your pharmacy or been printed and provided for you. Please take as directed and contact our office if you believe you are having problem(s) with the medication(s) or have any questions.  If your symptoms worsen or fail to improve, please contact our office for further instruction, or in case of emergency go directly to the emergency room at the closest medical facility.    [facility]  [facility] [address] [place] [address] Phone: [phone] Fax: [phone]

## 2015-03-20 NOTE — Progress Notes (Signed)
Pre visit review using our clinic review tool, if applicable. No additional management support is needed unless otherwise documented below in the visit note. 

## 2015-03-20 NOTE — Progress Notes (Signed)
Subjective:    Patient ID: Kelsey Hanna, female    DOB: Jun 29, 1950, 65 y.o.   MRN: [id number]  Chief Complaint  Patient presents with  . Medication Refill    needs refill of medication, states she used to be on flexeril and vicodin and that helped with her ankle and back pain and the percocet is not doing anything for her    HPI:  Kelsey Hanna is a 65 y.o. female who  has a past medical history of Bipolar 1 disorder ([place]); Hypothyroid; Dyslipidemia; Retinal detachment; Sleep apnea; Diabetes mellitus; Cancer ([place]); Chicken pox; and Depression. and presents today for an office visit.   1.) Chronic pain - Continues to experience the associated symptom of pain located in her back, sacrum and coccyx. Pain is described as "terrible" and constant. States that her quality of life is not good and what she deals with continuously in this day and age is unacceptable. She was referred to pain management and indicates that she was turned away from the pain clinic secondary to not having a photo identification. Severity is enough to effect her sleep.   Allergies  Allergen Reactions  . Haldol [Haloperidol] Other (See Comments)    Shaking of the hands, grinding her teeth  . Lithium Other (See Comments)    "makes me feel spaced out" slurred speech     Current Outpatient Prescriptions on File Prior to Visit  Medication Sig Dispense Refill  . levothyroxine (SYNTHROID) 137 MCG tablet Take 1 tablet (137 mcg total) by mouth daily before breakfast. 30 tablet 5  . metFORMIN (GLUCOPHAGE) 500 MG tablet Take 1 tablet (500 MG) by mouth 2 times daily 60 tablet 5  . oxyCODONE-acetaminophen (ROXICET) 5-325 MG tablet Take 1 tablet by mouth 2 (two) times daily as needed for severe pain. 60 tablet 0  . traZODone (DESYREL) 150 MG tablet Take 1 tablet (150 mg total) by mouth at bedtime. 30 tablet 5  . zolpidem (AMBIEN) 10 MG tablet Take 10 mg by mouth at bedtime as needed for sleep.     No current  facility-administered medications on file prior to visit.   Review of Systems  Constitutional: Negative for fever and chills.  Musculoskeletal: Positive for back pain and arthralgias.  Neurological: Negative for headaches.      Objective:    BP 112/78 mmHg  Pulse 80  Temp(Src) 98 F (36.7 C) (Oral)  Resp 18  Ht 5\' 3"  (1.6 m)  Wt 203 lb 12.8 oz (92.443 kg)  BMI 36.11 kg/m2  SpO2 94% Nursing note and vital signs reviewed.  Physical Exam  Constitutional: She is oriented to person, place, and time. She appears well-developed and well-nourished. No distress.  Cardiovascular: Normal rate, regular rhythm, normal heart sounds and intact distal pulses.   Pulmonary/Chest: Effort normal and breath sounds normal.  Neurological: She is alert and oriented to person, place, and time.  Skin: Skin is warm and dry.  Psychiatric: She has a normal mood and affect. Her behavior is normal. Judgment and thought content normal.       Assessment & Plan:   Problem List Items Addressed This Visit      Other   Chronic back pain - Primary    Continues to experience chronic back pain. Pain referral sent and incomplete secondary to patient not having her photo ID. Recent drug screen was positive for Tramadol which has not been prescribed by this provider. Discussed follow up with the pain clinic to establish for  long term management. Start Flexeril. Continue current Percocet pending pain management contact. Paperwork completed per patient request.       Relevant Medications   cyclobenzaprine (FLEXERIL) 5 MG tablet

## 2015-03-23 ENCOUNTER — Telehealth: Payer: Self-pay | Admitting: *Deleted

## 2015-03-23 MED ORDER — METFORMIN HCL 500 MG PO TABS: ORAL_TABLET | ORAL | Status: DC

## 2015-03-23 MED ORDER — TRAZODONE HCL 150 MG PO TABS: 150.0000 mg | ORAL_TABLET | Freq: Every day | ORAL | Status: DC

## 2015-03-23 MED ORDER — LEVOTHYROXINE SODIUM 137 MCG PO TABS: 137.0000 ug | ORAL_TABLET | Freq: Every day | ORAL | Status: DC

## 2015-03-23 MED ORDER — CYCLOBENZAPRINE HCL 5 MG PO TABS: 5.0000 mg | ORAL_TABLET | Freq: Three times a day (TID) | ORAL | Status: DC | PRN

## 2015-03-23 NOTE — Telephone Encounter (Signed)
Left msg on triage yesterday afternoon stating pt is being transfer their to better assist with her medications. The list that her previous pharmacy sent pt states that it was not correct. Would like a new rx on all meds rx by Terri Piedra..../LMB  Sent Rx's...Johny Chess

## 2015-03-29 ENCOUNTER — Encounter: Payer: Self-pay | Admitting: Family

## 2015-05-07 ENCOUNTER — Other Ambulatory Visit: Payer: Self-pay | Admitting: Family

## 2015-05-07 NOTE — Telephone Encounter (Signed)
i'm not showing all these meds on patient's current med list----please advise, thanks

## 2015-05-09 ENCOUNTER — Encounter (HOSPITAL_COMMUNITY): Payer: Self-pay

## 2015-05-09 ENCOUNTER — Emergency Department (HOSPITAL_COMMUNITY): Payer: Medicare Other

## 2015-05-09 ENCOUNTER — Observation Stay (HOSPITAL_COMMUNITY)
Admission: EM | Admit: 2015-05-09 | Discharge: 2015-05-10 | Disposition: A | Discharge status: Home / Self Care | Payer: Medicare Other | Attending: Internal Medicine | Admitting: Internal Medicine

## 2015-05-09 DIAGNOSIS — Z7984 Long term (current) use of oral hypoglycemic drugs: Secondary | ICD-10-CM | POA: Diagnosis not present

## 2015-05-09 DIAGNOSIS — T426X1A Poisoning by other antiepileptic and sedative-hypnotic drugs, accidental (unintentional), initial encounter: Secondary | ICD-10-CM | POA: Diagnosis not present

## 2015-05-09 DIAGNOSIS — J9691 Respiratory failure, unspecified with hypoxia: Secondary | ICD-10-CM | POA: Diagnosis not present

## 2015-05-09 DIAGNOSIS — F17210 Nicotine dependence, cigarettes, uncomplicated: Secondary | ICD-10-CM | POA: Insufficient documentation

## 2015-05-09 DIAGNOSIS — E785 Hyperlipidemia, unspecified: Secondary | ICD-10-CM | POA: Diagnosis present

## 2015-05-09 DIAGNOSIS — E11649 Type 2 diabetes mellitus with hypoglycemia without coma: Secondary | ICD-10-CM | POA: Insufficient documentation

## 2015-05-09 DIAGNOSIS — F3130 Bipolar disorder, current episode depressed, mild or moderate severity, unspecified: Secondary | ICD-10-CM | POA: Insufficient documentation

## 2015-05-09 DIAGNOSIS — G9340 Encephalopathy, unspecified: Secondary | ICD-10-CM | POA: Diagnosis not present

## 2015-05-09 DIAGNOSIS — J9621 Acute and chronic respiratory failure with hypoxia: Secondary | ICD-10-CM | POA: Diagnosis not present

## 2015-05-09 DIAGNOSIS — T50901A Poisoning by unspecified drugs, medicaments and biological substances, accidental (unintentional), initial encounter: Secondary | ICD-10-CM | POA: Diagnosis present

## 2015-05-09 DIAGNOSIS — E119 Type 2 diabetes mellitus without complications: Secondary | ICD-10-CM

## 2015-05-09 DIAGNOSIS — G4733 Obstructive sleep apnea (adult) (pediatric): Secondary | ICD-10-CM | POA: Diagnosis present

## 2015-05-09 DIAGNOSIS — Z79891 Long term (current) use of opiate analgesic: Secondary | ICD-10-CM | POA: Insufficient documentation

## 2015-05-09 DIAGNOSIS — E872 Acidosis: Secondary | ICD-10-CM | POA: Insufficient documentation

## 2015-05-09 DIAGNOSIS — Z7982 Long term (current) use of aspirin: Secondary | ICD-10-CM | POA: Insufficient documentation

## 2015-05-09 DIAGNOSIS — Y92009 Unspecified place in unspecified non-institutional (private) residence as the place of occurrence of the external cause: Secondary | ICD-10-CM | POA: Insufficient documentation

## 2015-05-09 DIAGNOSIS — Z8542 Personal history of malignant neoplasm of other parts of uterus: Secondary | ICD-10-CM | POA: Diagnosis not present

## 2015-05-09 DIAGNOSIS — J9601 Acute respiratory failure with hypoxia: Secondary | ICD-10-CM | POA: Diagnosis not present

## 2015-05-09 DIAGNOSIS — F3110 Bipolar disorder, current episode manic without psychotic features, unspecified: Secondary | ICD-10-CM | POA: Diagnosis present

## 2015-05-09 DIAGNOSIS — J9602 Acute respiratory failure with hypercapnia: Secondary | ICD-10-CM

## 2015-05-09 DIAGNOSIS — I10 Essential (primary) hypertension: Secondary | ICD-10-CM | POA: Diagnosis present

## 2015-05-09 DIAGNOSIS — J9692 Respiratory failure, unspecified with hypercapnia: Secondary | ICD-10-CM | POA: Diagnosis not present

## 2015-05-09 DIAGNOSIS — E039 Hypothyroidism, unspecified: Secondary | ICD-10-CM | POA: Diagnosis present

## 2015-05-09 DIAGNOSIS — E1165 Type 2 diabetes mellitus with hyperglycemia: Secondary | ICD-10-CM

## 2015-05-09 DIAGNOSIS — G92 Toxic encephalopathy: Secondary | ICD-10-CM | POA: Diagnosis not present

## 2015-05-09 DIAGNOSIS — T50901S Poisoning by unspecified drugs, medicaments and biological substances, accidental (unintentional), sequela: Secondary | ICD-10-CM | POA: Diagnosis not present

## 2015-05-09 DIAGNOSIS — Z79899 Other long term (current) drug therapy: Secondary | ICD-10-CM | POA: Insufficient documentation

## 2015-05-09 DIAGNOSIS — M549 Dorsalgia, unspecified: Secondary | ICD-10-CM | POA: Diagnosis not present

## 2015-05-09 DIAGNOSIS — Z66 Do not resuscitate: Secondary | ICD-10-CM | POA: Diagnosis not present

## 2015-05-09 DIAGNOSIS — G8929 Other chronic pain: Secondary | ICD-10-CM | POA: Diagnosis not present

## 2015-05-09 DIAGNOSIS — I480 Paroxysmal atrial fibrillation: Secondary | ICD-10-CM | POA: Diagnosis present

## 2015-05-09 DIAGNOSIS — T404X1A Poisoning by other synthetic narcotics, accidental (unintentional), initial encounter: Principal | ICD-10-CM | POA: Insufficient documentation

## 2015-05-09 LAB — COMPREHENSIVE METABOLIC PANEL
ALBUMIN: 4 g/dL (ref 3.5–5.0)
ALT: 22 U/L (ref 14–54)
AST: 21 U/L (ref 15–41)
Alkaline Phosphatase: 61 U/L (ref 38–126)
Anion gap: 14 (ref 5–15)
BILIRUBIN TOTAL: 0.2 mg/dL — AB (ref 0.3–1.2)
BUN: 27 mg/dL — AB (ref 6–20)
CHLORIDE: 101 mmol/L (ref 101–111)
CO2: 24 mmol/L (ref 22–32)
Calcium: 9.3 mg/dL (ref 8.9–10.3)
Creatinine, Ser: 0.78 mg/dL (ref 0.44–1.00)
GFR calc Af Amer: >60 mL/min (ref >60)
GFR calc non Af Amer: >60 mL/min (ref >60)
GLUCOSE: 159 mg/dL — AB (ref 65–99)
POTASSIUM: 4.1 mmol/L (ref 3.5–5.1)
Sodium: 139 mmol/L (ref 135–145)
Total Protein: 7 g/dL (ref 6.5–8.1)

## 2015-05-09 LAB — BLOOD GAS, ARTERIAL
ACID-BASE EXCESS: 1.6 mmol/L (ref 0.0–2.0)
Acid-Base Excess: 1.1 mmol/L (ref 0.0–2.0)
Acid-Base Excess: 2 mmol/L (ref 0.0–2.0)
Bicarbonate: 28.6 mEq/L — ABNORMAL HIGH (ref 20.0–24.0)
Bicarbonate: 28.8 mEq/L — ABNORMAL HIGH (ref 20.0–24.0)
Bicarbonate: 28.2 mEq/L — ABNORMAL HIGH (ref 20.0–24.0)
DELIVERY SYSTEMS: POSITIVE
DELIVERY SYSTEMS: POSITIVE
Drawn by: 244901
Drawn by: 257701
Drawn by: 232811
EXPIRATORY PAP: 5
EXPIRATORY PAP: 5
FIO2: 0.3
FIO2: 0.3
INSPIRATORY PAP: 10
INSPIRATORY PAP: 10
MODE: POSITIVE
O2 CONTENT: 2 L/min
O2 SAT: 96.8 %
O2 Saturation: 90.6 %
O2 Saturation: 94.4 %
PATIENT TEMPERATURE: 98.6
PCO2 ART: 62.6 mmHg — AB (ref 35.0–45.0)
PCO2 ART: 54.9 mmHg — AB (ref 35.0–45.0)
PH ART: 7.282 — AB (ref 7.350–7.450)
PO2 ART: 78.9 mmHg — AB (ref 80.0–100.0)
PO2 ART: 96.5 mmHg (ref 80.0–100.0)
Patient temperature: 98.6
Patient temperature: 97.4
TCO2: 26.7 mmol/L (ref 0–100)
TCO2: 26.9 mmol/L (ref 0–100)
TCO2: 26 mmol/L (ref 0–100)
pCO2 arterial: 59.3 mmHg (ref 35.0–45.0)
pH, Arterial: 7.307 — ABNORMAL LOW (ref 7.350–7.450)
pH, Arterial: 7.326 — ABNORMAL LOW (ref 7.350–7.450)
pO2, Arterial: 69.1 mmHg — ABNORMAL LOW (ref 80.0–100.0)

## 2015-05-09 LAB — RAPID URINE DRUG SCREEN, HOSP PERFORMED
Amphetamines: NOT DETECTED
BARBITURATES: NOT DETECTED
Benzodiazepines: NOT DETECTED
COCAINE: NOT DETECTED
Opiates: NOT DETECTED
TETRAHYDROCANNABINOL: NOT DETECTED

## 2015-05-09 LAB — CBC WITH DIFFERENTIAL/PLATELET
Basophils Absolute: 0 10*3/uL (ref 0.0–0.1)
Basophils Relative: 0 %
Eosinophils Absolute: 0.1 10*3/uL (ref 0.0–0.7)
Eosinophils Relative: 1 %
HEMATOCRIT: 37.5 % (ref 36.0–46.0)
HEMOGLOBIN: 12.5 g/dL (ref 12.0–15.0)
LYMPHS ABS: 2.6 10*3/uL (ref 0.7–4.0)
Lymphocytes Relative: 24 %
MCH: 31.5 pg (ref 26.0–34.0)
MCHC: 33.3 g/dL (ref 30.0–36.0)
MCV: 94.5 fL (ref 78.0–100.0)
MONOS PCT: 7 %
Monocytes Absolute: 0.8 10*3/uL (ref 0.1–1.0)
NEUTROS ABS: 7.6 10*3/uL (ref 1.7–7.7)
NEUTROS PCT: 68 %
Platelets: 193 10*3/uL (ref 150–400)
RBC: 3.97 MIL/uL (ref 3.87–5.11)
RDW: 13.3 % (ref 11.5–15.5)
WBC: 11.1 10*3/uL — ABNORMAL HIGH (ref 4.0–10.5)

## 2015-05-09 LAB — TROPONIN I

## 2015-05-09 LAB — CBG MONITORING, ED: Glucose-Capillary: 69 mg/dL (ref 65–99)

## 2015-05-09 LAB — SALICYLATE LEVEL: Salicylate Lvl: <4 mg/dL (ref 2.8–30.0)

## 2015-05-09 LAB — BRAIN NATRIURETIC PEPTIDE: B Natriuretic Peptide: 23.7 pg/mL (ref 0.0–100.0)

## 2015-05-09 LAB — ACETAMINOPHEN LEVEL: Acetaminophen (Tylenol), Serum: <10 ug/mL — ABNORMAL LOW (ref 10–30)

## 2015-05-09 MED ORDER — ACETAMINOPHEN 650 MG RE SUPP: 650.0000 mg | Freq: Four times a day (QID) | RECTAL | Status: DC | PRN

## 2015-05-09 MED ORDER — INSULIN ASPART 100 UNIT/ML ~~LOC~~ SOLN: 0.0000 [IU] | SUBCUTANEOUS | Status: DC

## 2015-05-09 MED ORDER — ONDANSETRON HCL 4 MG/2ML IJ SOLN
4.0000 mg | Freq: Once | INTRAMUSCULAR | Status: AC
  Administered 2015-05-09: 4 mg via INTRAVENOUS
  Filled 2015-05-09: qty 2

## 2015-05-09 MED ORDER — SODIUM CHLORIDE 0.9 % IV SOLN
INTRAVENOUS | Status: DC
  Administered 2015-05-10: 02:00:00 via INTRAVENOUS

## 2015-05-09 MED ORDER — SODIUM CHLORIDE 0.9% FLUSH
3.0000 mL | Freq: Two times a day (BID) | INTRAVENOUS | Status: DC
  Administered 2015-05-10 (×2): 3 mL via INTRAVENOUS

## 2015-05-09 MED ORDER — ENOXAPARIN SODIUM 40 MG/0.4ML ~~LOC~~ SOLN
40.0000 mg | Freq: Every day | SUBCUTANEOUS | Status: DC
  Administered 2015-05-10: 40 mg via SUBCUTANEOUS
  Filled 2015-05-09 (×2): qty 0.4

## 2015-05-09 MED ORDER — SODIUM CHLORIDE 0.9 % IV SOLN
Freq: Once | INTRAVENOUS | Status: AC
  Administered 2015-05-09: 13:00:00 via INTRAVENOUS

## 2015-05-09 MED ORDER — ALBUTEROL SULFATE (2.5 MG/3ML) 0.083% IN NEBU: 2.5000 mg | INHALATION_SOLUTION | RESPIRATORY_TRACT | Status: DC | PRN

## 2015-05-09 MED ORDER — ACETAMINOPHEN 325 MG PO TABS: 650.0000 mg | ORAL_TABLET | Freq: Four times a day (QID) | ORAL | Status: DC | PRN

## 2015-05-09 NOTE — ED Notes (Signed)
[facility] [phone]

## 2015-05-09 NOTE — ED Notes (Signed)
Pt attempting to obtain urine sample. 

## 2015-05-09 NOTE — ED Provider Notes (Signed)
CSN: [id number]     Arrival date & time 05/09/15  1147 History   First MD Initiated Contact with Patient 05/09/15 1149     Chief Complaint  Patient presents with  . Drug Overdose     (Consider location/radiation/quality/duration/timing/severity/associated sxs/prior Treatment) HPI  65 year old female presents after an overdose. EMS reports that the patient took 18 50 mg tramadol tablets in addition to 3 10 mg Ambien. This occurred around 9 AM per EMS, patient reports 8 AM. Patient has psychiatric and memory issues and so her daughter handles her medicines. These 21 pills were left in a single copy and the patient thought they were all for this morning. According to EMS the other medicines the patient takes are in a daily box. The patient denies trying to kill or hurt herself. She vomited on arrival to the ED and feels little nauseated now but otherwise feels well. No shortness of breath, chest pain, or abdominal pain. Initial oxygen saturation 86% on room air, no known lung diseases or oxygen requirement at baseline.  Past Medical History  Diagnosis Date  . Bipolar 1 disorder ([place])   . Hypothyroid   . Dyslipidemia   . Retinal detachment   . Sleep apnea   . Diabetes mellitus     dx within last yr....just takes pills  . Cancer [facility])     cancer of uterus...no chemo or radiation  . Chicken pox   . Depression    Past Surgical History  Procedure Laterality Date  . Abdominal hysterectomy    . Total abdominal hysterectomy w/ bilateral salpingoophorectomy    . Tonsillectomy    . Lumbar laminectomy/decompression microdiscectomy  07/17/2011    Procedure: LUMBAR LAMINECTOMY/DECOMPRESSION MICRODISCECTOMY;  Surgeon: Sinclair Ship, MD;  Location: [place];  Service: Orthopedics;  Laterality: Left;  Left sided lumbar 4-5 microdisectomy   Family History  Problem Relation Age of Onset  . Healthy Mother   . Healthy Father    Social History  Substance Use Topics  . Smoking status: Current  Every Day Smoker -- 0.25 packs/day for 35 years    Types: Cigarettes    Last Attempt to Quit: 07/09/1999  . Smokeless tobacco: None  . Alcohol Use: No     Comment: socially   OB History    No data available     Review of Systems  Respiratory: Negative for shortness of breath.   Cardiovascular: Negative for chest pain.  Gastrointestinal: Positive for nausea and vomiting. Negative for abdominal pain.      Allergies  Haldol; Lithium; and Morphine and related  Home Medications   Prior to Admission medications   Medication Sig Start Date End Date Taking? Authorizing Provider  aspirin EC 325 MG tablet TAKE 1 TABLET BY MOUTH DAILY 05/08/15   Golden Circle, FNP  atorvastatin (LIPITOR) 40 MG tablet TAKE 1 TABLET BY MOUTH IN THE EVENING 05/08/15   Golden Circle, FNP  cyclobenzaprine (FLEXERIL) 5 MG tablet Take 1 tablet (5 mg total) by mouth 3 (three) times daily as needed for muscle spasms. 03/23/15   Golden Circle, FNP  furosemide (LASIX) 40 MG tablet TAKE 1 TABLET BY MOUTH DAILY 05/08/15   Golden Circle, FNP  levothyroxine (SYNTHROID) 137 MCG tablet Take 1 tablet (137 mcg total) by mouth daily before breakfast. 03/23/15   Golden Circle, FNP  metFORMIN (GLUCOPHAGE) 500 MG tablet Take 1 tablet (500 MG) by mouth 2 times daily 03/23/15   Golden Circle, FNP  metoprolol [facility] [place])  50 MG tablet TAKE 1 TABLET BY MOUTH TWICE A DAY WITH MEALS 05/08/15   Golden Circle, FNP  oxyCODONE-acetaminophen (ROXICET) 5-325 MG tablet Take 1 tablet by mouth 2 (two) times daily as needed for severe pain. 02/28/15   Golden Circle, FNP  Thiamine HCl (B-1) 100 MG TABS TAKE 1 TABLET BY MOUTH DAILY 05/08/15   Golden Circle, FNP  traZODone (DESYREL) 150 MG tablet Take 1 tablet (150 mg total) by mouth at bedtime. 03/23/15   Golden Circle, FNP   BP 129/62 mmHg  Pulse 74  Resp 18  SpO2 95% Physical Exam  Constitutional: She appears well-developed and well-nourished.  Alert, appears sleepy but is  easily arousable with voice.  HENT:  Head: Normocephalic and atraumatic.  Right Ear: External ear normal.  Left Ear: External ear normal.  Nose: Nose normal.  Eyes: Right eye exhibits no discharge. Left eye exhibits no discharge.  Normal sized right pupil that is reactive. Left eye with significant cataract  Cardiovascular: Normal rate, regular rhythm and normal heart sounds.   Pulmonary/Chest: Effort normal and breath sounds normal.  Abdominal: Soft. She exhibits no distension. There is no tenderness.  Neurological: She is alert. She is disoriented.  Skin: Skin is warm and dry.  Nursing note and vitals reviewed.   ED Course  Procedures (including critical care time) Labs Review Labs Reviewed  COMPREHENSIVE METABOLIC PANEL - Abnormal; Notable for the following:    Glucose, Bld 159 (*)    BUN 27 (*)    Total Bilirubin 0.2 (*)    All other components within normal limits  ACETAMINOPHEN LEVEL - Abnormal; Notable for the following:    Acetaminophen (Tylenol), Serum <10 (*)    All other components within normal limits  CBC WITH DIFFERENTIAL/PLATELET - Abnormal; Notable for the following:    WBC 11.1 (*)    All other components within normal limits  BLOOD GAS, ARTERIAL - Abnormal; Notable for the following:    pH, Arterial 7.282 (*)    pCO2 arterial 62.6 (*)    pO2, Arterial 69.1 (*)    Bicarbonate 28.6 (*)    All other components within normal limits  SALICYLATE LEVEL  URINE RAPID DRUG SCREEN, HOSP PERFORMED  BRAIN NATRIURETIC PEPTIDE  TROPONIN I    Imaging Review Dg Chest 2 View  05/09/2015  CLINICAL DATA:  65 year old female with a history of shortness of breath EXAM: CHEST - 2 VIEW COMPARISON:  10/20/2014, chest CT 10/04/2014 FINDINGS: Cardiomediastinal silhouette projects larger on the current, likely secondary to apical lordotic positioning. No central vascular enlargement. Ill-defined opacity at the right lung base.  No pneumothorax. No displaced fracture. Unremarkable  appearance of the upper abdomen. IMPRESSION: Ill-defined mixed interstitial and airspace opacities at the lung bases, potentially atelectasis, edema, or consolidation. Low lung volumes. Signed, Dulcy Fanny. Earleen Newport, DO [facility] Electronically Signed   By: Corrie Mckusick D.O.   On: 05/09/2015 12:48   I have personally reviewed and evaluated these images and lab results as part of my medical decision-making.   EKG Interpretation   Date/Time:  Wednesday May 09 2015 12:15:20 EDT Ventricular Rate:  70 PR Interval:  175 QRS Duration: 103 QT Interval:  428 QTC Calculation: 462 R Axis:   -22 Text Interpretation:  Sinus rhythm Probable left atrial enlargement  Borderline left axis deviation Low voltage, precordial leads RSR' in V1 or  V2, probably normal variant no significant change since Sept 2016  Confirmed by  Easton Fetty  MD, Nicki Reaper [phone]) on 05/09/2015 12:17:31 PM      CRITICAL CARE Performed by: Sherwood Gambler T   Total critical care time: 30 minutes  Critical care time was exclusive of separately billable procedures and treating other patients.  Critical care was necessary to treat or prevent imminent or life-threatening deterioration.  Critical care was time spent personally by me on the following activities: development of treatment plan with patient and/or surrogate as well as nursing, discussions with consultants, evaluation of patient's response to treatment, examination of patient, obtaining history from patient or surrogate, ordering and performing treatments and interventions, ordering and review of laboratory studies, ordering and review of radiographic studies, pulse oximetry and re-evaluation of patient's condition.  MDM   Final diagnoses:  Acute respiratory failure with hypoxia and hypercapnia (HCC)  Overdose, accidental or unintentional, initial encounter    Patient with accidental overdose. Per poison control,  she was observed for 6 hours after her initial ingestion. She is still requiring oxygen although she is awake and alert. She is in no respiratory distress. She is talking to me. Due to continued hypoxia, ABG obtained and shows both hypoxemia and hypercarbia with respiratory acidosis. She will be placed on BiPAP and will need to be admitted to the hospital. Discussed with hospitalist, will admit to stepdown under observation.     Sherwood Gambler, MD 05/09/15 [phone]

## 2015-05-09 NOTE — ED Notes (Signed)
MD at bedside. ADMISSION MD PRESENT EVALUATING THIS PT

## 2015-05-09 NOTE — ED Notes (Signed)
RRT DEE TO REEVALUATE BIPAP AND PT CURRENT STATUS.

## 2015-05-09 NOTE — ED Notes (Signed)
Pt changed to hospital bed.

## 2015-05-09 NOTE — H&P (Signed)
History and Physical  Kelsey Hanna [id number] DOB: [birth year] DOA: 05/09/2015  Referring physician: Dr. Sherwood Gambler, EDP  PCP: Mauricio Po, [place]  Outpatient Specialists:  1. Psychiatry: at [facility] Complaint: Unintentional drug overdose and sleepiness.  HPI: Kelsey Hanna is a 65 y.o. female , single, lives alone, independent of activities of daily living, PMH of bipolar disorder, hypothyroid, HLD, OSA-not on CPAP, DM 2, chronic pain, presented to ED on 05/09/15 after overdosing on tramadol 50 mg 18 tablets and Ambien 10 mg 3 tablets along with her usual dose of prescription medications which she took at approximately 9 AM on 05/09/15. As per report, patient has psychiatric and memory issues and hence her daughter handles her medications. Apparently they were 21 pills left in a cup which patient thought were for her for this morning and hence took them. She denied suicidal intent or ideations. Apparently someone went to the house this morning and noted that she was sleepy and then she reported the overdose. EMS was called and patient was transported to the ED. In the ED, she vomited 1 on arrival. She was hypoxic at 86% on room air. She continued to be intermittently somnolent upon which ABG was checked and noted to have hypercapnic and hypoxic respiratory failure and acidosis. She was started on BiPAP. Hospitalist admission was requested.  As per daughter, in the past she has gone to patient's house and left a days supply of pain and sleep medications. Patient recently got a new CNA. She also filled a new prescription for tramadol and Ambien. Feeling comfortable that the CNA was going to be with patient for the next 3 days, the daughter left 3 days supply of tramadol and Ambien in patient's house which she then inadvertently took. When CNA came in this morning, she was concerned and called the daughter.   Review of Systems: All systems reviewed and apart from history of  presenting illness, pertinent for chronic pain right shoulder, lower back and left ankle. Otherwise negative.  Past Medical History  Diagnosis Date  . Bipolar 1 disorder ([place])   . Hypothyroid   . Dyslipidemia   . Retinal detachment   . Sleep apnea   . Diabetes mellitus     dx within last yr....just takes pills  . Cancer [facility])     cancer of uterus...no chemo or radiation  . Chicken pox   . Depression    Past Surgical History  Procedure Laterality Date  . Abdominal hysterectomy    . Total abdominal hysterectomy w/ bilateral salpingoophorectomy    . Tonsillectomy    . Lumbar laminectomy/decompression microdiscectomy  07/17/2011    Procedure: LUMBAR LAMINECTOMY/DECOMPRESSION MICRODISCECTOMY;  Surgeon: Sinclair Ship, MD;  Location: [place];  Service: Orthopedics;  Laterality: Left;  Left sided lumbar 4-5 microdisectomy   Social History:  reports that she has been smoking Cigarettes.  She has a 8.75 pack-year smoking history. She does not have any smokeless tobacco history on file. She reports that she does not drink alcohol or use illicit drugs. The rest as per history of presenting illness. Patient states that she has quit smoking.  Allergies  Allergen Reactions  . Haldol [Haloperidol] Other (See Comments)    Shaking of the hands, grinding her teeth  . Lithium Other (See Comments)    "makes me feel spaced out" slurred speech  . Morphine And Related     Family History  Problem Relation Age of Onset  . Healthy Mother   .  Healthy Father     Prior to Admission medications   Medication Sig Start Date End Date Taking? Authorizing Provider  aspirin EC 325 MG tablet TAKE 1 TABLET BY MOUTH DAILY 05/08/15   Golden Circle, FNP  atorvastatin (LIPITOR) 40 MG tablet TAKE 1 TABLET BY MOUTH IN THE EVENING 05/08/15   Golden Circle, FNP  cyclobenzaprine (FLEXERIL) 5 MG tablet Take 1 tablet (5 mg total) by mouth 3 (three) times daily as needed for muscle spasms. 03/23/15   Golden Circle, FNP  furosemide (LASIX) 40 MG tablet TAKE 1 TABLET BY MOUTH DAILY 05/08/15   Golden Circle, FNP  levothyroxine (SYNTHROID) 137 MCG tablet Take 1 tablet (137 mcg total) by mouth daily before breakfast. 03/23/15   Golden Circle, FNP  metFORMIN (GLUCOPHAGE) 500 MG tablet Take 1 tablet (500 MG) by mouth 2 times daily 03/23/15   Golden Circle, FNP  metoprolol (LOPRESSOR) 50 MG tablet TAKE 1 TABLET BY MOUTH TWICE A DAY WITH MEALS 05/08/15   Golden Circle, FNP  oxyCODONE-acetaminophen (ROXICET) 5-325 MG tablet Take 1 tablet by mouth 2 (two) times daily as needed for severe pain. 02/28/15   Golden Circle, FNP  Thiamine HCl (B-1) 100 MG TABS TAKE 1 TABLET BY MOUTH DAILY 05/08/15   Golden Circle, FNP  traMADol (ULTRAM) 50 MG tablet Take 50 mg by mouth. 04/24/15   Historical Provider, MD  traZODone (DESYREL) 150 MG tablet Take 1 tablet (150 mg total) by mouth at bedtime. 03/23/15   Golden Circle, FNP  zolpidem (AMBIEN) 10 MG tablet Take 10 mg by mouth at bedtime. 05/07/15   Historical Provider, MD   Physical Exam: Filed Vitals:   05/09/15 1330 05/09/15 1530 05/09/15 1537 05/09/15 1600  BP: 115/57 126/92 125/92 114/55  Pulse: 71 78 77 75  Temp:      TempSrc:      Resp: 15 10 12 12   SpO2: 92% 94% 95% 97%  Temperature 78F.   General exam: Moderately built and overweight female patient, lying comfortably propped up on the gurney in no obvious distress. On BiPAP.  Head, eyes and ENT: Nontraumatic and normocephalic. Pupils equally reacting to light and accommodation. Oral mucosa moist.  Neck: Supple. No JVD, carotid bruit or thyromegaly.  Lymphatics: No lymphadenopathy.  Respiratory system: Clear to auscultation. No increased work of breathing.  Cardiovascular system: S1 and S2 heard, RRR. No JVD, murmurs, gallops, clicks or pedal edema.  Gastrointestinal system: Abdomen is nondistended, soft and nontender. Normal bowel sounds heard. No organomegaly or masses  appreciated.  Central nervous system: Alert and oriented. No focal neurological deficits.  Extremities: Symmetric 5 x 5 power. Peripheral pulses symmetrically felt.   Skin: No rashes or acute findings.  Musculoskeletal system: Negative exam.  Psychiatry: Pleasant and cooperative. Denies suicidal or homicidal ideations or audiovisual hallucinations.   Labs on Admission:  Basic Metabolic Panel:  Recent Labs Lab 05/09/15 1235  NA 139  K 4.1  CL 101  CO2 24  GLUCOSE 159*  BUN 27*  CREATININE 0.78  CALCIUM 9.3   Liver Function Tests:  Recent Labs Lab 05/09/15 1235  AST 21  ALT 22  ALKPHOS 61  BILITOT 0.2*  PROT 7.0  ALBUMIN 4.0   No results for input(s): LIPASE, AMYLASE in the last 168 hours. No results for input(s): AMMONIA in the last 168 hours. CBC:  Recent Labs Lab 05/09/15 1235  WBC 11.1*  NEUTROABS 7.6  HGB 12.5  HCT 37.5  MCV 94.5  PLT 193   Cardiac Enzymes:  Recent Labs Lab 05/09/15 1232  TROPONINI <0.03    BNP (last 3 results) No results for input(s): PROBNP in the last 8760 hours. CBG: No results for input(s): GLUCAP in the last 168 hours.  Radiological Exams on Admission: Dg Chest 2 View  05/09/2015  CLINICAL DATA:  65 year old female with a history of shortness of breath EXAM: CHEST - 2 VIEW COMPARISON:  10/20/2014, chest CT 10/04/2014 FINDINGS: Cardiomediastinal silhouette projects larger on the current, likely secondary to apical lordotic positioning. No central vascular enlargement. Ill-defined opacity at the right lung base.  No pneumothorax. No displaced fracture. Unremarkable appearance of the upper abdomen. IMPRESSION: Ill-defined mixed interstitial and airspace opacities at the lung bases, potentially atelectasis, edema, or consolidation. Low lung volumes. Signed, Dulcy Fanny. Earleen Newport, DO [facility] Electronically Signed   By: Corrie Mckusick D.O.   On: 05/09/2015 12:48    EKG:  Independently reviewed. Sinus rhythm, LAD, RSR pattern in V1-2 and no acute changes. QTC 462 ms.  Assessment/Plan Principal Problem:   Respiratory failure with hypoxia and hypercapnia (HCC) Active Problems:   Acute encephalopathy   Type 2 diabetes mellitus (HCC)   Essential hypertension   Hypothyroidism   Bipolar affective disorder, manic (HCC)   HLD (hyperlipidemia)   AF (paroxysmal atrial fibrillation) (HCC)   Chronic back pain   OSA (obstructive sleep apnea)   Overdose   Drug overdose-tramadol and Ambien - Patient states that she inadvertently took these pills and denies suicidal intent or ideations. - As per report, patient's daughter manages her medications for unclear how patient had access to these medications. - EDP discussed with poison control who recommended monitoring closely for sedation and respiratory depression - Avoid any sedative medications. Monitor closely in stepdown unit.  Acute hypoxic and hypercapnic respiratory failure/acute respiratory acidosis - Secondary to hypoventilation from drug overdose related respiratory suppression complicating underlying untreated OSA/OHS - Continue BiPAP for a couple of hours and recheck ABG. Mental status improving. - Patient does not use oxygen or CPAP at home.  Essential hypertension - Controlled. Resume home medications pending pharmacy medication review  Type II DM - SSI  Acute mild encephalopathy - Secondary to drug overdose and acute respiratory acidosis/hypercapnia. No focal deficits. Improving. Monitor closely.  Hypothyroid - Resume medications pending reconciliation by pharmacy.  Bipolar disorder - Seems to be stable. Outpatient follow-up with psychiatry.  HLD  Paroxysmal A. Fib - Clinically in sinus rhythm.  Chronic pain - Avoid sedative narcotics for now.  OSA - States that she has stopped using CPAP for several years due to discomfort.    DVT Prophylaxis: Lovenox Code Status: DO NOT  RESUSCITATE  Family Communication: discussed with patient's daughter Ms. Adrian Prows. Updated care and answered questions. Disposition Plan: Admit to stepdown unit. Possible discharge home 3/23.   Time spent: 59 minutes  Chonte Ricke, MD, FACP, FHM. [facility] Pager [phone]  If 7PM-7AM, please contact night-coverage [email or web address] Password [facility] 05/09/2015, 4:40 PM

## 2015-05-09 NOTE — Progress Notes (Signed)
EDCM went to speak to patient at bedside, however, patient on Bipap and sleeping.  EDCM did not disturb patient at this time.

## 2015-05-09 NOTE — ED Notes (Signed)
Per [facility]- Found by Sandy RN this am. HX of mental health issues. Family placed meds tramadol 50mg  (18 tablet) and ambien 10mg  (3) in tea cup. 3 days worth. Pt thinking to take all at once. Pt believes ingested at 9am. Pt presents alert however sleepy. EDP [place] present to evaluate at [facility]. Family aware of pt situation. [employer] present

## 2015-05-09 NOTE — ED Notes (Signed)
Bed: [id number] Expected date:  Expected time:  Means of arrival:  Comments: 61F/accidental OD

## 2015-05-09 NOTE — ED Notes (Signed)
[place]

## 2015-05-10 DIAGNOSIS — J9622 Acute and chronic respiratory failure with hypercapnia: Secondary | ICD-10-CM

## 2015-05-10 DIAGNOSIS — G4733 Obstructive sleep apnea (adult) (pediatric): Secondary | ICD-10-CM

## 2015-05-10 DIAGNOSIS — T50901S Poisoning by unspecified drugs, medicaments and biological substances, accidental (unintentional), sequela: Secondary | ICD-10-CM

## 2015-05-10 DIAGNOSIS — T404X1A Poisoning by other synthetic narcotics, accidental (unintentional), initial encounter: Secondary | ICD-10-CM | POA: Diagnosis not present

## 2015-05-10 DIAGNOSIS — M549 Dorsalgia, unspecified: Secondary | ICD-10-CM | POA: Diagnosis not present

## 2015-05-10 DIAGNOSIS — G9340 Encephalopathy, unspecified: Secondary | ICD-10-CM | POA: Diagnosis not present

## 2015-05-10 DIAGNOSIS — E039 Hypothyroidism, unspecified: Secondary | ICD-10-CM

## 2015-05-10 DIAGNOSIS — J9621 Acute and chronic respiratory failure with hypoxia: Secondary | ICD-10-CM | POA: Diagnosis not present

## 2015-05-10 LAB — CBG MONITORING, ED
GLUCOSE-CAPILLARY: 102 mg/dL — AB (ref 65–99)
GLUCOSE-CAPILLARY: 112 mg/dL — AB (ref 65–99)

## 2015-05-10 MED ORDER — COLCHICINE 0.6 MG PO TABS
0.6000 mg | ORAL_TABLET | Freq: Every day | ORAL | Status: DC
  Administered 2015-05-10: 0.6 mg via ORAL
  Filled 2015-05-10: qty 1

## 2015-05-10 MED ORDER — ASPIRIN EC 325 MG PO TBEC
325.0000 mg | DELAYED_RELEASE_TABLET | Freq: Every day | ORAL | Status: DC
  Administered 2015-05-10: 325 mg via ORAL
  Filled 2015-05-10: qty 1

## 2015-05-10 MED ORDER — VITAMIN B-1 100 MG PO TABS
100.0000 mg | ORAL_TABLET | Freq: Every day | ORAL | Status: DC
  Administered 2015-05-10: 100 mg via ORAL
  Filled 2015-05-10: qty 1

## 2015-05-10 MED ORDER — ATORVASTATIN CALCIUM 40 MG PO TABS
40.0000 mg | ORAL_TABLET | Freq: Every evening | ORAL | Status: DC
  Filled 2015-05-10: qty 1

## 2015-05-10 MED ORDER — LOXAPINE SUCCINATE 10 MG PO CAPS
10.0000 mg | ORAL_CAPSULE | Freq: Every day | ORAL | Status: DC
  Filled 2015-05-10: qty 1

## 2015-05-10 MED ORDER — INSULIN ASPART 100 UNIT/ML ~~LOC~~ SOLN: 0.0000 [IU] | Freq: Three times a day (TID) | SUBCUTANEOUS | Status: DC

## 2015-05-10 MED ORDER — DIVALPROEX SODIUM ER 500 MG PO TB24
500.0000 mg | ORAL_TABLET | Freq: Two times a day (BID) | ORAL | Status: DC
  Administered 2015-05-10: 500 mg via ORAL
  Filled 2015-05-10: qty 1

## 2015-05-10 MED ORDER — FUROSEMIDE 40 MG PO TABS
40.0000 mg | ORAL_TABLET | Freq: Every day | ORAL | Status: DC
  Administered 2015-05-10: 40 mg via ORAL
  Filled 2015-05-10: qty 1

## 2015-05-10 MED ORDER — CYCLOBENZAPRINE HCL 10 MG PO TABS: 5.0000 mg | ORAL_TABLET | Freq: Three times a day (TID) | ORAL | Status: DC | PRN

## 2015-05-10 MED ORDER — TRAZODONE HCL 50 MG PO TABS: 150.0000 mg | ORAL_TABLET | Freq: Every day | ORAL | Status: DC

## 2015-05-10 MED ORDER — OLANZAPINE 10 MG PO TABS
10.0000 mg | ORAL_TABLET | Freq: Every day | ORAL | Status: DC
  Administered 2015-05-10: 10 mg via ORAL
  Filled 2015-05-10: qty 1

## 2015-05-10 MED ORDER — ZOLPIDEM TARTRATE 10 MG PO TABS: 10.0000 mg | ORAL_TABLET | Freq: Every day | ORAL | Status: DC

## 2015-05-10 MED ORDER — B-1 100 MG PO TABS: 1.0000 | ORAL_TABLET | Freq: Every day | ORAL | Status: DC

## 2015-05-10 MED ORDER — LEVOTHYROXINE SODIUM 137 MCG PO TABS
137.0000 ug | ORAL_TABLET | Freq: Every day | ORAL | Status: DC
  Administered 2015-05-10: 137 ug via ORAL
  Filled 2015-05-10 (×2): qty 1

## 2015-05-10 MED ORDER — METOPROLOL TARTRATE 25 MG PO TABS
50.0000 mg | ORAL_TABLET | Freq: Two times a day (BID) | ORAL | Status: DC
  Administered 2015-05-10: 50 mg via ORAL
  Filled 2015-05-10: qty 2

## 2015-05-10 NOTE — Progress Notes (Signed)
PT remains off BiPAP- states she is not in respiratory distress at this time. Plans per MD to discharge.

## 2015-05-10 NOTE — ED Notes (Addendum)
Pt's O2 sat remains between 91 and 92%.  Will notify Hospitalist.

## 2015-05-10 NOTE — Discharge Instructions (Signed)
Accidental Overdose °A drug overdose occurs when a chemical substance (drug or medication) is used in amounts large enough to overcome a person. This may result in severe illness or death. This is a type of poisoning. Accidental overdoses of medications or other substances come from a variety of reasons. When this happens accidentally, it is often because the person taking the substance does not know enough about what they have taken. Drugs which commonly cause overdose deaths are alcohol, psychotropic medications (medications which affect the mind), pain medications, illegal drugs (street drugs) such as cocaine and heroin, and multiple drugs taken at the same time. It may result from careless behavior (such as over-indulging at a party). Other causes of overdose may include multiple drug use, a lapse in memory, or drug use after a period of no drug use.  °Sometimes overdosing occurs because a person cannot remember if they have taken their medication.  °A common unintentional overdose in young children involves multi-vitamins containing iron. Iron is a part of the hemoglobin molecule in blood. It is used to transport oxygen to living cells. When taken in small amounts, iron allows the body to restock hemoglobin. In large amounts, it causes problems in the body. If this overdose is not treated, it can lead to death. °Never take medicines that show signs of tampering or do not seem quite right. Never take medicines in the dark or in poor lighting. Read the label and check each dose of medicine before you take it. When adults are poisoned, it happens most often through carelessness or lack of information. Taking medicines in the dark or taking medicine prescribed for someone else to treat the same type of problem is a dangerous practice. °SYMPTOMS  °Symptoms of overdose depend on the medication and amount taken. They can vary from over-activity with stimulant over-dosage, to sleepiness from depressants such as  alcohol, narcotics and tranquilizers. Confusion, dizziness, nausea and vomiting may be present. If problems are severe enough coma and death may result. °DIAGNOSIS  °Diagnosis and management are generally straightforward if the drug is known. Otherwise it is more difficult. At times, certain symptoms and signs exhibited by the patient, or blood tests, can reveal the drug in question.  °TREATMENT  °In an emergency department, most patients can be treated with supportive measures. Antidotes may be available if there has been an overdose of opioids or benzodiazepines. A rapid improvement will often occur if this is the cause of overdose. °At home or away from medical care: °· There may be no immediate problems or warning signs in children. °· Not everything works well in all cases of poisoning. °· Take immediate action. Poisons may act quickly. °· If you think someone has swallowed medicine or a household product, and the person is unconscious, having seizures (convulsions), or is not breathing, immediately call for an ambulance. °IF a person is conscious and appears to be doing OK but has swallowed a poison: °· Do not wait to see what effect the poison will have. Immediately call a poison control center (listed in the white pages of your telephone book under "Poison Control" or inside the front cover with other emergency numbers). Some poison control centers have TTY capability for the deaf. Check with your local center if you or someone in your family requires this service. °· Keep the container so you can read the label on the product for ingredients. °· Describe what, when, and how much was taken and the age and condition of the person poisoned.   Inform them if the person is vomiting, choking, drowsy, shows a change in color or temperature of skin, is conscious or unconscious, or is convulsing.  Do not cause vomiting unless instructed by medical personnel. Do not induce vomiting or force liquids into a person who  is convulsing, unconscious, or very drowsy. Stay calm and in control.   Activated charcoal also is sometimes used in certain types of poisoning and you may wish to add a supply to your emergency medicines. It is available without a prescription. Call a poison control center before using this medication. PREVENTION  Thousands of children die every year from unintentional poisoning. This may be from household chemicals, poisoning from carbon monoxide in a car, taking their parent's medications, or simply taking a few iron pills or vitamins with iron. Poisoning comes from unexpected sources.  Store medicines out of the sight and reach of children, preferably in a locked cabinet. Do not keep medications in a food cabinet. Always store your medicines in a secure place. Get rid of expired medications.  If you have children living with you or have them as occasional guests, you should have child-resistant caps on your medicine containers. Keep everything out of reach. Child proof your home.  If you are called to the telephone or to answer the door while you are taking a medicine, take the container with you or put the medicine out of the reach of small children.  Do not take your medication in front of children. Do not tell your child how good a medication is and how good it is for them. They may get the idea it is more of a treat.  If you are an adult and have accidentally taken an overdose, you need to consider how this happened and what can be done to prevent it from happening again. If this was from a street drug or alcohol, determine if there is a problem that needs addressing. If you are not sure a problems exists, it is easy to talk to a professional and ask them if they think you have a problem. It is better to handle this problem in this way before it happens again and has a much worse consequence.   This information is not intended to replace advice given to you by your health care provider. Make  sure you discuss any questions you have with your health care provider.   Document Released: 04/19/2004 Document Revised: 02/24/2014 Document Reviewed: 07/24/2014 Elsevier Interactive Patient Education 2016 Elsevier Inc[employer]  Acute Respiratory failure  Respiratory failure is when your lungs are not working well and your breathing (respiratory) system fails. When respiratory failure occurs, it is difficult for your lungs to get enough oxygen, get rid of carbon dioxide, or both. Respiratory failure can be life threatening.  Respiratory failure can be acute or chronic. Acute respiratory failure is sudden, severe, and requires emergency medical treatment. Chronic respiratory failure is less severe, happens over time, and requires ongoing treatment.  WHAT ARE THE CAUSES OF ACUTE RESPIRATORY FAILURE?  Any problem affecting the heart or lungs can cause acute respiratory failure. Some of these causes include the following:  Chronic bronchitis and emphysema (COPD).   Blood clot going to a lung (pulmonary embolism).   Having water in the lungs caused by heart failure, lung injury, or infection (pulmonary edema).   Collapsed lung (pneumothorax).   Pneumonia.   Pulmonary fibrosis.   Obesity.   Asthma.   Heart failure.   Any type of trauma to the  chest that can make breathing difficult.   Nerve or muscle diseases making chest movements difficult. HOW WILL MY ACUTE RESPIRATORY FAILURE BE TREATED?  Treatment of acute respiratory failure depends on the cause of the respiratory failure. Usually, you will stay in the intensive care unit so your breathing can be watched closely. Treatment can include the following:  Oxygen. Oxygen can be delivered through the following:  Nasal cannula. This is small tubing that goes in your nose to give you oxygen.  Face mask. A face mask covers your nose and mouth to give you oxygen.  Medicine. Different medicines can be given to help with breathing.  These can include:  Nebulizers. Nebulizers deliver medicines to open the air passages (bronchodilators). These medicines help to open or relax the airways in the lungs so you can breathe better. They can also help loosen mucus from your lungs.  Diuretics. Diuretic medicines can help you breathe better by getting rid of extra water in your body.  Steroids. Steroid medicines can help decrease swelling (inflammation) in your lungs.  Antibiotics.  Chest tube. If you have a collapsed lung (pneumothorax), a chest tube is placed to help reinflate the lung.  Noninvasive positive pressure ventilation (NPPV). This is a tight-fitting mask that goes over your nose and mouth. The mask has tubing that is attached to a machine. The machine blows air into the tubing, which helps to keep the tiny air sacs (alveoli) in your lungs open. This machine allows you to breathe on your own.  Ventilator. A ventilator is a breathing machine. When on a ventilator, a breathing tube is put into the lungs. A ventilator is used when you can no longer breathe well enough on your own. You may have low oxygen levels or high carbon dioxide (CO2) levels in your blood. When you are on a ventilator, sedation and pain medicines are given to make you sleep so your lungs can heal. SEEK IMMEDIATE MEDICAL CARE IF:  You have shortness of breath (dyspnea) with or without activity.  You have rapid breathing (tachypnea).  You are wheezing.  You are unable to say more than a few words without having to catch your breath.  You find it very difficult to function normally.  You have a fast heart rate.  You have a bluish color to your finger or toe nail beds.  You have confusion or drowsiness or both.   This information is not intended to replace advice given to you by your health care provider. Make sure you discuss any questions you have with your health care provider.   Document Released: 02/08/2013 Document Revised: 10/25/2014  Document Reviewed: 02/08/2013 Elsevier Interactive Patient Education [employer].

## 2015-05-10 NOTE — ED Notes (Signed)
4oz OJ given

## 2015-05-10 NOTE — ED Notes (Signed)
Pt alert. Respirations even and unlabored. Bilateral rise and fall of chest. Skin warm and dry. In no acute distress. Denies needs and pain.  O2 decreased from 2L to 1L.  Will continue to monitor.

## 2015-05-10 NOTE — Progress Notes (Signed)
PT had already been removed from BiPAP when RT entered room. PT on RA= 94%- does not appear to be in respiratory distress at this time.

## 2015-05-10 NOTE — ED Notes (Signed)
Hospitalist reports that he received my message, spoke w/ Pt's daughter, and will d/c her briefly.

## 2015-05-10 NOTE — ED Notes (Signed)
Pt ambulated to restroom w/o difficulty or assistance.

## 2015-05-10 NOTE — ED Notes (Signed)
Spoke w/ Pt's daughter Sharyn Lull [phone]) and gave her an update regarding medications that were administered this morning.  Daughter asked that we wheel the Pt out to the lobby and she will be here shortly to pick her up.  Pt agreed to this.

## 2015-05-10 NOTE — Progress Notes (Signed)
Pt seen, off bipap since around 2300 per RN.  Pt appears comfortable, no increased wob or respiratory distress noted.  Pt currently on 2lnc, HR82, RR15, Spo2 100%.  Bipap in room on standby if needed.  RT will continue to monitor and assess pt as needed.

## 2015-05-10 NOTE — Discharge Summary (Addendum)
Physician Discharge Summary  Kelsey Hanna  [id number]  DOB: 1951-01-23  DOA: 05/09/2015  PCP: Mauricio Po, FNP  Outpatient Specialists:  1. Psychiatry: at [facility] date: 05/09/2015 Discharge date: 05/10/2015  Time spent: Less than 30 minutes  Recommendations for Outpatient Follow-up:  1. Psychiatrist at [facility] in 1 week. [place], FNP/PCP in 5 days. Consider outpatient reevaluation for OSA with sleep study.  Discharge Diagnoses:  Principal Problem:   Respiratory failure with hypoxia and hypercapnia (HCC) Active Problems:   Acute encephalopathy   Type 2 diabetes mellitus (HCC)   Essential hypertension   Hypothyroidism   Bipolar affective disorder, manic (HCC)   HLD (hyperlipidemia)   AF (paroxysmal atrial fibrillation) (HCC)   Chronic back pain   OSA (obstructive sleep apnea)   Overdose   Discharge Condition: Improved & Stable  Diet recommendation: Heart healthy and diabetic diet.  There were no vitals filed for this visit.  History of present illness & Hospital course:  65 y.o. female , single, lives alone, independent of activities of daily living, PMH of bipolar disorder, hypothyroid, HLD, OSA-not on CPAP, DM 2, chronic pain, presented to ED on 05/09/15 after overdosing on tramadol 50 mg 18 tablets and Ambien 10 mg 3 tablets along with her usual dose of prescription medications which she took at approximately 9 AM on 05/09/15. As per daughter, in the past she has gone to patient's house and left a days supply of pain and sleep medications. Patient recently got a new CNA. She also filled a new prescription for tramadol and Ambien. Feeling comfortable that the CNA was going to be with patient for the next 3 days, the daughter left 3 days supply of tramadol and Ambien in patient's house which patient then inadvertently took. When CNA came in later that morning, she was concerned and called the daughter. Patient was noted to be sleepy. EMS was called  and patient was transported to the ED. In the ED, she vomited 1 on arrival. She was hypoxic at 86% on room air. She continued to be intermittently somnolent upon which ABG was checked and noted to have hypercapnic and hypoxic respiratory failure and acidosis. She was started on BiPAP. Hospitalist admission was requested.  Patient clearly stated that she inadvertently took the pills and had no suicidal intent or ideations. She was made NPO and placed on BiPAP due to acute hypoxic and hypercapnic respiratory failure related to sedation from OD complicating underlying untreated OSA/OHS. ABGs showed improvement as documented below. BiPAP was removed at approximately 11 PM on 05/09/15. Patient states that she slept well last night. As per ED nursing report, she has remained consistently awake and alert without any further sedation. She has tolerated diet without any further nausea or vomiting. She has ambulated to the bathroom without assistance. I called poison control center and discussed with Ms. Steffanie Dunn and a have closed patient's case and do not recommend any further follow-up. She had a brief episode of mild hypoglycemia last night possibly from not eating which has since resolved. 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 Acute respiratory failure has resolved and she is saturating at 91-92 percent on room air with activity. No clinical concern for decompensated CHF or pneumonia. Her home medications were resumed today. Patient noted to be in sinus rhythm on telemetry. She denies being on blood thinners other than aspirin. She was advised not to take more than prescribed doses  of medications and she verbalizes understanding. I have called and left a message with patient's daughter Ms. Cato Mulligan advising that someone other than the patient should be responsible for managing patient's medications so that such  occurrence of OD does not happen again. Patient advised to follow-up with outpatient psychiatry regarding management of her bipolar disorder-note that she is on polypharmacy and this can be evaluated and managed as deemed necessary. Patient is also on pain medications for chronic pain issues which is followed and managed by orthopedics as outpatient.  ABG    Component Value Date/Time   PHART 7.326* 05/09/2015 2105   PCO2ART 54.9* 05/09/2015 2105   PO2ART 96.5 05/09/2015 2105   HCO3 28.2* 05/09/2015 2105   TCO2 26.0 05/09/2015 2105   ACIDBASEDEF 5.0* 10/04/2014 1044   O2SAT 96.8 05/09/2015 2105    Initial ABG 05/09/15 at 3 PM: PH 7.282, PCO2 62.6, PO2 69.1, bicarbonate 28.6 and oxygen saturation 90.6%.  Consultations:  None  Procedures:  BiPAP    Discharge Exam:  Complaints:  Denies complaints. No dyspnea, cough, chest pain, headache or sleepiness. As per ED nursing, patient has been consistently awake and alert, appropriate, ambulated to bathroom without assistance.  Filed Vitals:   05/10/15 0700 05/10/15 0730 05/10/15 0800 05/10/15 0951  BP: 125/65 117/74 114/68 122/62  Pulse: 82 82 83 85  Temp:    98.3 F (36.8 C)  TempSrc:    Oral  Resp: 11 12 10 16   SpO2: 97% 96% 92% 94%    General exam: Pleasant middle-aged female, moderately built and overweight, sitting up comfortably in the bed in no obvious distress. 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 Respiratory system: Clear. No increased work of breathing. Cardiovascular system: S1 & S2 heard, RRR. No JVD, murmurs, gallops, clicks or pedal edema. Telemetry: Sinus rhythm. Gastrointestinal system: Abdomen is nondistended, soft and nontender. Normal bowel sounds heard. Central nervous system: Alert and oriented. No focal neurological deficits. Extremities: Symmetric 5 x 5 power. Psychiatry: Pleasant and cooperative. No suicidal or homicidal ideations or audiovisual hallucinations.  Discharge Instructions       Discharge Instructions    Call MD for:  difficulty breathing, headache or visual disturbances    Complete by:  As directed      Call MD for:    Complete by:  As directed   Confusion or altered mental status.     Diet - low sodium heart healthy    Complete by:  As directed      Diet Carb Modified    Complete by:  As directed      Increase activity slowly    Complete by:  As directed             Medication List    TAKE these medications        aspirin EC 325 MG tablet  TAKE 1 TABLET BY MOUTH DAILY     atorvastatin 40 MG tablet  Commonly known as:  LIPITOR  TAKE 1 TABLET BY MOUTH IN THE EVENING     B-1 100 MG Tabs  TAKE 1 TABLET BY MOUTH DAILY     colchicine 0.6 MG tablet  Take 0.6 mg by mouth daily.     cyclobenzaprine 5 MG tablet  Commonly known as:  FLEXERIL  Take 1 tablet (5 mg total) by mouth 3 (three) times daily as needed for muscle spasms.     divalproex 500 MG 24 hr tablet  Commonly known as:  DEPAKOTE ER  Take 500 mg by mouth 2 (two) times daily.     furosemide 40 MG tablet  Commonly known as:  LASIX  TAKE 1 TABLET BY MOUTH DAILY     levothyroxine 137 MCG tablet  Commonly known as:  SYNTHROID  Take 1 tablet (137 mcg total) by mouth daily before breakfast.     loxapine 10 MG capsule  Commonly known as:  LOXITANE  Take 10 mg by mouth at bedtime.     metFORMIN 500 MG tablet  Commonly known as:  GLUCOPHAGE  Take 1 tablet (500 MG) by mouth 2 times daily     metoprolol 50 MG tablet  Commonly known as:  LOPRESSOR  TAKE 1 TABLET BY MOUTH TWICE A DAY WITH MEALS     OLANZapine 10 MG tablet  Commonly known as:  ZYPREXA  Take 10 mg by mouth daily.     oxyCODONE-acetaminophen 5-325 MG tablet  Commonly known as:  ROXICET  Take 1 tablet by mouth 2 (two) times daily as needed for severe pain.     traMADol 50 MG tablet  Commonly known as:  ULTRAM  Take 50 mg by mouth every 6 (six) hours as needed for moderate pain.     traZODone 150 MG tablet   Commonly known as:  DESYREL  Take 1 tablet (150 mg total) by mouth at bedtime.     zolpidem 10 MG tablet  Commonly known as:  AMBIEN  Take 10 mg by mouth at bedtime.       Follow-up Information    Follow up with Psychiatrist at [facility]. Schedule an appointment as soon as possible for a visit in 1 week.      Follow up with Mauricio Po, [place]. Schedule an appointment as soon as possible for a visit in 5 days.   Specialty:  Family Medicine   Contact information:   [address] [id number] [phone]       Get Medicines reviewed and adjusted: Please take all your medications with you for your next visit with your Primary MD  Please request your Primary MD to go over all hospital tests and procedure/radiological results at the follow up. Please ask your Primary MD to get all Hospital records sent to his/her office.  If you experience worsening of your admission symptoms, develop shortness of breath, life threatening emergency, suicidal or homicidal thoughts you must seek medical attention immediately by calling 911 or calling your MD immediately if symptoms less severe.  You must read complete instructions/literature along with all the possible adverse reactions/side effects for all the Medicines you take and that have been prescribed to you. Take any new Medicines after you have completely understood and accept all the possible adverse reactions/side effects.   Do not drive when taking pain medications.   Do not take more than prescribed Pain, Sleep and Anxiety Medications  Special Instructions: If you have smoked or chewed Tobacco in the last 2 yrs please stop smoking, stop any regular Alcohol and or any Recreational drug use.  Wear Seat belts while driving.  Please note  You were cared for by a hospitalist during your hospital stay. Once you are discharged, your primary care physician will handle any further medical issues. Please note that NO REFILLS for any  discharge medications will be authorized once you are discharged, as it is imperative that you return to your primary care physician (or establish a relationship with a primary care physician if you do not have one) for  your aftercare needs so that they can reassess your need for medications and monitor your lab values.    The results of significant diagnostics from this hospitalization (including imaging, microbiology, ancillary and laboratory) are listed below for reference.    Significant Diagnostic Studies: Dg Chest 2 View  05/09/2015  CLINICAL DATA:  65 year old female with a history of shortness of breath EXAM: CHEST - 2 VIEW COMPARISON:  10/20/2014, chest CT 10/04/2014 FINDINGS: Cardiomediastinal silhouette projects larger on the current, likely secondary to apical lordotic positioning. No central vascular enlargement. Ill-defined opacity at the right lung base.  No pneumothorax. No displaced fracture. Unremarkable appearance of the upper abdomen. IMPRESSION: Ill-defined mixed interstitial and airspace opacities at the lung bases, potentially atelectasis, edema, or consolidation. Low lung volumes. Signed, Dulcy Fanny. Earleen Newport, DO [facility] Radiology Electronically Signed   By: Corrie Mckusick D.O.   On: 05/09/2015 12:48    Microbiology: No results found for this or any previous visit (from the past 240 hour(s)).   Labs: Basic Metabolic Panel:  Recent Labs Lab 05/09/15 1235  NA 139  K 4.1  CL 101  CO2 24  GLUCOSE 159*  BUN 27*  CREATININE 0.78  CALCIUM 9.3   Liver Function Tests:  Recent Labs Lab 05/09/15 1235  AST 21  ALT 22  ALKPHOS 61  BILITOT 0.2*  PROT 7.0  ALBUMIN 4.0   No results for input(s): LIPASE, AMYLASE in the last 168 hours. No results for input(s): AMMONIA in the last 168 hours. CBC:  Recent Labs Lab 05/09/15 1235  WBC 11.1*  NEUTROABS 7.6  HGB 12.5  HCT 37.5  MCV 94.5  PLT 193   Cardiac  Enzymes:  Recent Labs Lab 05/09/15 1232  TROPONINI <0.03   BNP: BNP (last 3 results)  Recent Labs  05/09/15 1232  BNP 23.7    ProBNP (last 3 results) No results for input(s): PROBNP in the last 8760 hours.  CBG:  Recent Labs Lab 05/09/15 2344 05/10/15 0339 05/10/15 0845  GLUCAP 69 102* 112*       Signed:  Vernell Leep, MD, FACP, FHM. [facility] Pager [phone]  If 7PM-7AM, please contact night-coverage [email or web address] Password [email or web address] 05/10/2015, 11:11 AM

## 2015-05-10 NOTE — ED Notes (Signed)
Per Hospitalist, if Pt's O2 sat remains above 90% on room air and Pt can ambulate w/o assistance then she will be discharged home.

## 2015-05-11 NOTE — Progress Notes (Signed)
HPI: FU atrial fibrillation. Chest CT August 2016 showed emphysema, calcific aortic and coronary atherosclerosis. Echocardiogram March 2016 showed ejection fraction 45-50%. There was mild to moderate mitral regurgitation and mild to moderate pulmonary hypertension. Patient was seen in March 2016 after being found down on the floor. She was in atrial fibrillation but converted to sinus rhythm. It was noted at that time that she had some confusion and was bipolar. We elected not to begin anticoagulation. We plan to repeat her echocardiogram with follow-up office visits. She has not been seen since then. She denies dyspnea on exertion, orthopnea, PND, pedal edema, palpitations, syncope or chest pain. She is legally blind.  Current Outpatient Prescriptions  Medication Sig Dispense Refill  . aspirin EC 325 MG tablet TAKE 1 TABLET BY MOUTH DAILY 90 tablet 3  . cyclobenzaprine (FLEXERIL) 5 MG tablet TAKE 1 TABLET BY MOUTH THREE TIMES A DAY AS NEEDED FOR MUSCLE SPASM 90 tablet 0  . divalproex (DEPAKOTE ER) 500 MG 24 hr tablet Take 500 mg by mouth 2 (two) times daily.    . furosemide (LASIX) 40 MG tablet TAKE 1 TABLET BY MOUTH DAILY 90 tablet 0  . levothyroxine (SYNTHROID) 137 MCG tablet Take 1 tablet (137 mcg total) by mouth daily before breakfast. 90 tablet 1  . loxapine (LOXITANE) 10 MG capsule Take 10 mg by mouth at bedtime.    . metFORMIN (GLUCOPHAGE) 500 MG tablet Take 1 tablet (500 MG) by mouth 2 times daily 180 tablet 1  . metoprolol (LOPRESSOR) 50 MG tablet TAKE 1 TABLET BY MOUTH TWICE A DAY WITH MEALS 180 tablet 1  . OLANZapine (ZYPREXA) 10 MG tablet Take 10 mg by mouth daily.    . Thiamine HCl (B-1) 100 MG TABS TAKE 1 TABLET BY MOUTH DAILY 30 tablet 2  . traZODone (DESYREL) 150 MG tablet Take 1 tablet (150 mg total) by mouth at bedtime. 90 tablet 1  . zolpidem (AMBIEN) 10 MG tablet Take 10 mg by mouth at bedtime.     No current facility-administered medications for this visit.      Past Medical History  Diagnosis Date  . Bipolar 1 disorder ([place])   . Hypothyroid   . Dyslipidemia   . Retinal detachment   . Sleep apnea   . Diabetes mellitus     dx within last yr....just takes pills  . Cancer [facility])     cancer of uterus...no chemo or radiation  . Chicken pox   . Depression     Past Surgical History  Procedure Laterality Date  . Abdominal hysterectomy    . Total abdominal hysterectomy w/ bilateral salpingoophorectomy    . Tonsillectomy    . Lumbar laminectomy/decompression microdiscectomy  07/17/2011    Procedure: LUMBAR LAMINECTOMY/DECOMPRESSION MICRODISCECTOMY;  Surgeon: Sinclair Ship, MD;  Location: [facility];  Service: Orthopedics;  Laterality: Left;  Left sided lumbar 4-5 microdisectomy    Social History   Social History  . Marital Status: Divorced    Spouse Name: N/A  . Number of Children: 2  . Years of Education: 12   Occupational History  . Disability    Social History Main Topics  . Smoking status: Current Every Day Smoker -- 0.25 packs/day for 35 years    Types: Cigarettes    Last Attempt to Quit: 07/09/1999  . Smokeless tobacco: Not on file  . Alcohol Use: No     Comment: socially  . Drug Use: No  . Sexual Activity: Not on file  Other Topics Concern  . Not on file   Social History Narrative   Denies abuse and feels safe at home.     Family History  Problem Relation Age of Onset  . Healthy Mother   . Healthy Father     ROS: no fevers or chills, productive cough, hemoptysis, dysphasia, odynophagia, melena, hematochezia, dysuria, hematuria, rash, seizure activity, orthopnea, PND, pedal edema, claudication. Remaining systems are negative.  Physical Exam: Well-developed obese in no acute distress.  Skin is warm and dry.  HEENT is normal. Blind in left eye Neck is supple.  Chest is clear to auscultation with normal expansion.  Cardiovascular exam is regular rate and rhythm.  Abdominal exam nontender or distended. No  masses palpated. Extremities show no edema. neuro grossly intact  ECG 05/09/2015-sinus rhythm, RV conduction delay.

## 2015-05-14 ENCOUNTER — Other Ambulatory Visit: Payer: Self-pay | Admitting: Family

## 2015-05-14 NOTE — Telephone Encounter (Signed)
Last refill was 03/23/15

## 2015-05-17 ENCOUNTER — Encounter: Payer: Self-pay | Admitting: Family

## 2015-05-17 ENCOUNTER — Encounter: Payer: Self-pay | Admitting: Cardiology

## 2015-05-17 ENCOUNTER — Ambulatory Visit (INDEPENDENT_AMBULATORY_CARE_PROVIDER_SITE_OTHER): Payer: Medicare Other | Admitting: Cardiology

## 2015-05-17 ENCOUNTER — Ambulatory Visit (INDEPENDENT_AMBULATORY_CARE_PROVIDER_SITE_OTHER): Payer: Medicare Other | Admitting: Family

## 2015-05-17 VITALS — BP 110/68 | HR 75 | Temp 97.8°F | Resp 16 | Ht 63.0 in | Wt 207.0 lb

## 2015-05-17 VITALS — BP 130/80 | HR 70 | Ht 63.0 in | Wt 207.6 lb

## 2015-05-17 DIAGNOSIS — Z79899 Other long term (current) drug therapy: Secondary | ICD-10-CM

## 2015-05-17 DIAGNOSIS — I059 Rheumatic mitral valve disease, unspecified: Secondary | ICD-10-CM | POA: Diagnosis not present

## 2015-05-17 DIAGNOSIS — Z720 Tobacco use: Secondary | ICD-10-CM | POA: Diagnosis not present

## 2015-05-17 DIAGNOSIS — T50901A Poisoning by unspecified drugs, medicaments and biological substances, accidental (unintentional), initial encounter: Secondary | ICD-10-CM | POA: Insufficient documentation

## 2015-05-17 DIAGNOSIS — E118 Type 2 diabetes mellitus with unspecified complications: Secondary | ICD-10-CM

## 2015-05-17 DIAGNOSIS — I10 Essential (primary) hypertension: Secondary | ICD-10-CM

## 2015-05-17 DIAGNOSIS — T50901D Poisoning by unspecified drugs, medicaments and biological substances, accidental (unintentional), subsequent encounter: Secondary | ICD-10-CM | POA: Diagnosis not present

## 2015-05-17 DIAGNOSIS — I340 Nonrheumatic mitral (valve) insufficiency: Secondary | ICD-10-CM | POA: Diagnosis not present

## 2015-05-17 NOTE — Assessment & Plan Note (Signed)
Patient counseled on discontinuing. 

## 2015-05-17 NOTE — Assessment & Plan Note (Signed)
Patient has had one episode of atrial fibrillation. This occurred after a fall and being found down. It may have been related to the stress of her fall. She is also legally blind. I will not initiate anticoagulation at this point. CHADSvasc 3. If she has recurrent episodes in the future we will consider that at present I feel the risk outweighs the benefit. Continue metoprolol.

## 2015-05-17 NOTE — Progress Notes (Signed)
Subjective:    Patient ID: Kelsey Hanna, female    DOB: 06/27/1950, 66 y.o.   MRN: [id number]  Chief Complaint  Patient presents with  . Hospitalization Follow-up    wants to get off of some of the medications, lasix, meformin, metoprolol, wants to get lab work done in a month to check levels     HPI:  Kelsey Hanna is a 65 y.o. female who  has a past medical history of Bipolar 1 disorder ([place]); Hypothyroid; Dyslipidemia; Retinal detachment; Sleep apnea; Diabetes mellitus; Cancer ([place]); Chicken pox; and Depression. and presents today for a follow up.   Recently evaluated in the emergency department for potential accidental overdose indicating that she took 18 50 mg tramadol tablets and 3 10 mg Ambien tablets. Indicates she was confused and forgot whether she had taken all of her medications and took these accidentally resulting in an overdose. Denies thoughts of suicide. She was diagnosed as an accidental overdose with poison control being contacted. She was observed for 6 hours after initial ingestion and requiring oxygen, however was awake and alert through the entire time. There is no respiratory distress. She was placed on BiPAP secondary to acute hypoxic and hypercapnic respiratory failure related to sedation. She was observed overnight and taken off of BiPAP. Believed to have possibly underlying hypercapnia secondary to possible obstructive sleep apnea that was exacerbated by sedative medications. She was advised to follow-up with psychiatry. This was possibly due to polypharmacy as well. She is advised to follow-up with [facility] and primary care. All hospital records and notes reviewed in detail.  Since being out of the hospital she is feeling fine and able to complete her activities of daily living. Reports taking the medication as prescribed and notes that it is challenging for her to stay up on her pills. She has had no further issues with medication and is requesting a review of  her medication.   Allergies  Allergen Reactions  . Haldol [Haloperidol] Other (See Comments)    Shaking of the hands, grinding her teeth  . Lithium Other (See Comments)    "makes me feel spaced out" slurred speech  . Morphine And Related      Current Outpatient Prescriptions on File Prior to Visit  Medication Sig Dispense Refill  . cyclobenzaprine (FLEXERIL) 5 MG tablet TAKE 1 TABLET BY MOUTH THREE TIMES A DAY AS NEEDED FOR MUSCLE SPASM 90 tablet 0  . divalproex (DEPAKOTE ER) 500 MG 24 hr tablet Take 500 mg by mouth 2 (two) times daily.    Marland Kitchen levothyroxine (SYNTHROID) 137 MCG tablet Take 1 tablet (137 mcg total) by mouth daily before breakfast. 90 tablet 1  . loxapine (LOXITANE) 10 MG capsule Take 10 mg by mouth at bedtime.    Marland Kitchen OLANZapine (ZYPREXA) 10 MG tablet Take 10 mg by mouth daily.    . traZODone (DESYREL) 150 MG tablet Take 1 tablet (150 mg total) by mouth at bedtime. 90 tablet 1  . zolpidem (AMBIEN) 10 MG tablet Take 10 mg by mouth at bedtime.     No current facility-administered medications on file prior to visit.     Past Surgical History  Procedure Laterality Date  . Abdominal hysterectomy    . Total abdominal hysterectomy w/ bilateral salpingoophorectomy    . Tonsillectomy    . Lumbar laminectomy/decompression microdiscectomy  07/17/2011    Procedure: LUMBAR LAMINECTOMY/DECOMPRESSION MICRODISCECTOMY;  Surgeon: Sinclair Ship, MD;  Location: [facility];  Service: Orthopedics;  Laterality: Left;  Left sided lumbar 4-5 microdisectomy    Past Medical History  Diagnosis Date  . Bipolar 1 disorder ([place])   . Hypothyroid   . Dyslipidemia   . Retinal detachment   . Sleep apnea   . Diabetes mellitus     dx within last yr....just takes pills  . Cancer [facility])     cancer of uterus...no chemo or radiation  . Chicken pox   . Depression     Review of Systems  Constitutional: Negative for fever and chills.  Eyes:       Denies changes in vision  Respiratory: Negative  for chest tightness, shortness of breath and wheezing.   Cardiovascular: Negative for chest pain, palpitations and leg swelling.  Endocrine: Negative for polydipsia, polyphagia and polyuria.  Neurological: Negative for weakness, numbness and headaches.      Objective:    BP 110/68 mmHg  Pulse 75  Temp(Src) 97.8 F (36.6 C) (Oral)  Resp 16  Ht 5\' 3"  (1.6 m)  Wt 207 lb (93.895 kg)  BMI 36.68 kg/m2  SpO2 96% Nursing note and vital signs reviewed.  Physical Exam  Constitutional: She is oriented to person, place, and time. She appears well-developed and well-nourished. No distress.  Cardiovascular: Normal rate, regular rhythm, normal heart sounds and intact distal pulses.   Pulmonary/Chest: Effort normal and breath sounds normal.  Neurological: She is alert and oriented to person, place, and time.  Skin: Skin is warm and dry.  Psychiatric: She has a normal mood and affect. Her behavior is normal. Judgment and thought content normal.       Assessment & Plan:   Problem List Items Addressed This Visit      Cardiovascular and Mediastinum   Essential hypertension - Primary    Hypertension well-controlled and below goal 140/90 with current regimen. To decrease risk for polypharmacy, discontinue metoprolol and furosemide at this time. Continue to monitor blood pressure at home. Follow-up in 3 months or sooner to recheck blood pressure and continued need for medication.        Endocrine   Type 2 diabetes mellitus (HCC)    Type 2 diabetes is for a well controlled with no A1c is greater than 6 in the last 2 years. To decreased risk for polypharmacy, discontinue metformin at this time. Recheck in 3 months to determine effectiveness and continued need of medication.        Other   Accidental drug overdose    Symptoms appear consistent with accidental drug overdose secondary to polypharmacy and patient confusion. Medication lists have been altered and reduced where able. No adverse  symptoms noted and patient has returned to baseline. No further treatment is necessary at this time. Follow-up if continues to experience issues with polypharmacy or potential risk of medication overdose.       Other Visit Diagnoses    Polypharmacy          I have discontinued Kelsey Hanna's metFORMIN, furosemide, aspirin EC, B-1, and metoprolol. I am also having her maintain her levothyroxine, traZODone, zolpidem, divalproex, OLANZapine, loxapine, and cyclobenzaprine.

## 2015-05-17 NOTE — Assessment & Plan Note (Signed)
Hypertension well-controlled and below goal 140/90 with current regimen. To decrease risk for polypharmacy, discontinue metoprolol and furosemide at this time. Continue to monitor blood pressure at home. Follow-up in 3 months or sooner to recheck blood pressure and continued need for medication.

## 2015-05-17 NOTE — Progress Notes (Signed)
Pre visit review using our clinic review tool, if applicable. No additional management support is needed unless otherwise documented below in the visit note. 

## 2015-05-17 NOTE — Assessment & Plan Note (Signed)
Repeat echocardiogram. 

## 2015-05-17 NOTE — Assessment & Plan Note (Signed)
Symptoms appear consistent with accidental drug overdose secondary to polypharmacy and patient confusion. Medication lists have been altered and reduced where able. No adverse symptoms noted and patient has returned to baseline. No further treatment is necessary at this time. Follow-up if continues to experience issues with polypharmacy or potential risk of medication overdose.

## 2015-05-17 NOTE — Patient Instructions (Signed)

## 2015-05-17 NOTE — Assessment & Plan Note (Signed)
Blood pressure controlled. Continue present medications. 

## 2015-05-17 NOTE — Assessment & Plan Note (Signed)
Type 2 diabetes is for a well controlled with no A1c is greater than 6 in the last 2 years. To decreased risk for polypharmacy, discontinue metformin at this time. Recheck in 3 months to determine effectiveness and continued need of medication.

## 2015-05-17 NOTE — Patient Instructions (Addendum)
Thank you for choosing [facility].  Summary/Instructions:  Please STOP taking the following medications:  Metformin Furosemide Metoprolol Thiamine  Aspirin  Multivitamin  Please continue to take your other medications as prescribed.   Please monitor your blood pressure at home.  Follow up with psychiatry regarding other medications.   If your symptoms worsen or fail to improve, please contact our office for further instruction, or in case of emergency go directly to the emergency room at the closest medical facility.

## 2015-06-08 ENCOUNTER — Encounter (HOSPITAL_COMMUNITY): Payer: Self-pay | Admitting: *Deleted

## 2015-06-08 ENCOUNTER — Emergency Department (HOSPITAL_COMMUNITY): Payer: Medicare Other

## 2015-06-08 ENCOUNTER — Emergency Department (HOSPITAL_COMMUNITY)
Admission: EM | Admit: 2015-06-08 | Discharge: 2015-06-08 | Disposition: A | Discharge status: Home / Self Care | Payer: Medicare Other | Attending: Emergency Medicine | Admitting: Emergency Medicine

## 2015-06-08 ENCOUNTER — Emergency Department (HOSPITAL_BASED_OUTPATIENT_CLINIC_OR_DEPARTMENT_OTHER)
Admit: 2015-06-08 | Discharge: 2015-06-08 | Disposition: A | Discharge status: Home / Self Care | Payer: Medicare Other | Attending: Emergency Medicine | Admitting: Emergency Medicine

## 2015-06-08 ENCOUNTER — Ambulatory Visit (HOSPITAL_COMMUNITY): Payer: Medicare Other | Attending: Cardiology

## 2015-06-08 DIAGNOSIS — I5020 Unspecified systolic (congestive) heart failure: Secondary | ICD-10-CM | POA: Diagnosis not present

## 2015-06-08 DIAGNOSIS — Z8669 Personal history of other diseases of the nervous system and sense organs: Secondary | ICD-10-CM | POA: Diagnosis not present

## 2015-06-08 DIAGNOSIS — F17210 Nicotine dependence, cigarettes, uncomplicated: Secondary | ICD-10-CM | POA: Insufficient documentation

## 2015-06-08 DIAGNOSIS — M79609 Pain in unspecified limb: Secondary | ICD-10-CM

## 2015-06-08 DIAGNOSIS — I10 Essential (primary) hypertension: Secondary | ICD-10-CM | POA: Insufficient documentation

## 2015-06-08 DIAGNOSIS — E119 Type 2 diabetes mellitus without complications: Secondary | ICD-10-CM | POA: Diagnosis not present

## 2015-06-08 DIAGNOSIS — Z79899 Other long term (current) drug therapy: Secondary | ICD-10-CM | POA: Insufficient documentation

## 2015-06-08 DIAGNOSIS — I480 Paroxysmal atrial fibrillation: Secondary | ICD-10-CM | POA: Insufficient documentation

## 2015-06-08 DIAGNOSIS — E039 Hypothyroidism, unspecified: Secondary | ICD-10-CM | POA: Insufficient documentation

## 2015-06-08 DIAGNOSIS — Z8619 Personal history of other infectious and parasitic diseases: Secondary | ICD-10-CM | POA: Insufficient documentation

## 2015-06-08 DIAGNOSIS — Z8541 Personal history of malignant neoplasm of cervix uteri: Secondary | ICD-10-CM | POA: Insufficient documentation

## 2015-06-08 DIAGNOSIS — M7989 Other specified soft tissue disorders: Secondary | ICD-10-CM | POA: Diagnosis present

## 2015-06-08 DIAGNOSIS — R0602 Shortness of breath: Secondary | ICD-10-CM | POA: Insufficient documentation

## 2015-06-08 DIAGNOSIS — R600 Localized edema: Secondary | ICD-10-CM | POA: Diagnosis not present

## 2015-06-08 DIAGNOSIS — F319 Bipolar disorder, unspecified: Secondary | ICD-10-CM | POA: Insufficient documentation

## 2015-06-08 LAB — CBC WITH DIFFERENTIAL/PLATELET
BASOS ABS: 0 10*3/uL (ref 0.0–0.1)
Basophils Relative: 0 %
EOS ABS: 0.1 10*3/uL (ref 0.0–0.7)
EOS PCT: 2 %
HCT: 36.4 % (ref 36.0–46.0)
Hemoglobin: 11.7 g/dL — ABNORMAL LOW (ref 12.0–15.0)
LYMPHS PCT: 37 %
Lymphs Abs: 2.2 10*3/uL (ref 0.7–4.0)
MCH: 30.4 pg (ref 26.0–34.0)
MCHC: 32.1 g/dL (ref 30.0–36.0)
MCV: 94.5 fL (ref 78.0–100.0)
Monocytes Absolute: 0.4 10*3/uL (ref 0.1–1.0)
Monocytes Relative: 7 %
NEUTROS PCT: 54 %
Neutro Abs: 3.2 10*3/uL (ref 1.7–7.7)
PLATELETS: 180 10*3/uL (ref 150–400)
RBC: 3.85 MIL/uL — AB (ref 3.87–5.11)
RDW: 13.7 % (ref 11.5–15.5)
WBC: 6 10*3/uL (ref 4.0–10.5)

## 2015-06-08 LAB — BRAIN NATRIURETIC PEPTIDE: B NATRIURETIC PEPTIDE 5: 25.5 pg/mL (ref 0.0–100.0)

## 2015-06-08 LAB — BASIC METABOLIC PANEL
ANION GAP: 9 (ref 5–15)
BUN: 12 mg/dL (ref 6–20)
CO2: 28 mmol/L (ref 22–32)
Calcium: 9.2 mg/dL (ref 8.9–10.3)
Chloride: 109 mmol/L (ref 101–111)
Creatinine, Ser: 0.68 mg/dL (ref 0.44–1.00)
GFR calc Af Amer: >60 mL/min (ref >60)
Glucose, Bld: 94 mg/dL (ref 65–99)
POTASSIUM: 4.6 mmol/L (ref 3.5–5.1)
SODIUM: 146 mmol/L — AB (ref 135–145)

## 2015-06-08 LAB — I-STAT TROPONIN, ED: TROPONIN I, POC: 0.01 ng/mL (ref 0.00–0.08)

## 2015-06-08 MED ORDER — SULFAMETHOXAZOLE-TRIMETHOPRIM 800-160 MG PO TABS: 1.0000 | ORAL_TABLET | Freq: Two times a day (BID) | ORAL | Status: AC

## 2015-06-08 MED ORDER — FUROSEMIDE 20 MG PO TABS: 20.0000 mg | ORAL_TABLET | Freq: Every day | ORAL | Status: DC

## 2015-06-08 MED ORDER — CEPHALEXIN 500 MG PO CAPS: 500.0000 mg | ORAL_CAPSULE | Freq: Four times a day (QID) | ORAL | Status: DC

## 2015-06-08 MED ORDER — FUROSEMIDE 10 MG/ML IJ SOLN
20.0000 mg | Freq: Once | INTRAMUSCULAR | Status: AC
  Administered 2015-06-08: 20 mg via INTRAVENOUS
  Filled 2015-06-08: qty 4

## 2015-06-08 NOTE — ED Notes (Addendum)
Main lab was called to request lab work to be drawn patient

## 2015-06-08 NOTE — ED Notes (Signed)
Unsuccessful lab draw L hand RN Kathlee Nations aware

## 2015-06-08 NOTE — ED Notes (Addendum)
Pt states SOB and Leg swelling Left greater than Right. Able to move and feel in both feet.  Pt reports broke lt foot about 6 years ago.  PT is legally blind.  PT sts pain in middle of back and Lt shoulder and coccyx. 90 % on RA.

## 2015-06-08 NOTE — ED Notes (Signed)
Pt wants to wait for iv team.

## 2015-06-08 NOTE — Progress Notes (Signed)
*  Preliminary Results* Bilateral lower extremity venous duplex completed. Bilateral lower extremities are negative for deep vein thrombosis. There is no evidence of Baker's cyst bilaterally.  06/08/2015  Maudry Mayhew, RVT, RDCS, RDMS

## 2015-06-08 NOTE — ED Provider Notes (Signed)
CSN: [id number]     Arrival date & time 06/08/15  0946 History   First MD Initiated Contact with Patient 06/08/15 [phone]     Chief Complaint  Patient presents with  . Foot Swelling    Left     (Consider location/radiation/quality/duration/timing/severity/associated sxs/prior Treatment) HPI 65 year old female who presents with lower extremity edema and shortness of breath. She has a history of paroxysmal atrial fibrillation not on anticoagulation, mild systolic heart failure with EF of 45%, diabetes, hypertension hyperlipidemia. States that over the past week she has had gradually worsening lower extremity edema and pain in her lower extremities. Has also had increased abdominal distention and some shortness of breath with activity. Sleeps on 2 pillows, which is her baseline.  Has not had fever, cough, chest pain, PND, nausea or vomiting, diarrhea, or urinary complaints. States that she has not taken her Lasix for quite some time, and did not think she had needed it but now thinks that she does. Past Medical History  Diagnosis Date  . Bipolar 1 disorder ([place])   . Hypothyroid   . Dyslipidemia   . Retinal detachment   . Sleep apnea   . Diabetes mellitus     dx within last yr....just takes pills  . Cancer [facility])     cancer of uterus...no chemo or radiation  . Chicken pox   . Depression    Past Surgical History  Procedure Laterality Date  . Abdominal hysterectomy    . Total abdominal hysterectomy w/ bilateral salpingoophorectomy    . Tonsillectomy    . Lumbar laminectomy/decompression microdiscectomy  07/17/2011    Procedure: LUMBAR LAMINECTOMY/DECOMPRESSION MICRODISCECTOMY;  Surgeon: Sinclair Ship, MD;  Location: [facility];  Service: Orthopedics;  Laterality: Left;  Left sided lumbar 4-5 microdisectomy   Family History  Problem Relation Age of Onset  . Healthy Mother   . Healthy Father    Social History  Substance Use Topics  . Smoking status: Current Every Day Smoker -- 0.25  packs/day for 35 years    Types: Cigarettes    Last Attempt to Quit: 07/09/1999  . Smokeless tobacco: None  . Alcohol Use: No     Comment: socially   OB History    No data available     Review of Systems 10/14 systems reviewed and are negative other than those stated in the HPI   Allergies  Review of patient's allergies indicates no known allergies.  Home Medications   Prior to Admission medications   Medication Sig Start Date End Date Taking? Authorizing Provider  atorvastatin (LIPITOR) 40 MG tablet Take 40 mg by mouth daily. 05/18/15  Yes Historical Provider, MD  buprenorphine-naloxone (SUBOXONE) 2-0.5 MG SUBL SL tablet Place 1 tablet under the tongue daily. 06/06/15  Yes Historical Provider, MD  cyclobenzaprine (FLEXERIL) 5 MG tablet TAKE 1 TABLET BY MOUTH THREE TIMES A DAY AS NEEDED FOR MUSCLE SPASM 05/14/15  Yes Golden Circle, FNP  diphenhydramine-acetaminophen (TYLENOL PM) 25-500 MG TABS tablet Take 2 tablets by mouth at bedtime as needed (pain/sleep).   Yes Historical Provider, MD  divalproex (DEPAKOTE ER) 500 MG 24 hr tablet Take 500 mg by mouth 2 (two) times daily. 10/30/14 10/30/15 Yes Historical Provider, MD  levothyroxine (SYNTHROID) 137 MCG tablet Take 1 tablet (137 mcg total) by mouth daily before breakfast. 03/23/15  Yes Golden Circle, FNP  loxapine (LOXITANE) 10 MG capsule Take 10 mg by mouth at bedtime. 10/30/14 10/30/15 Yes Historical Provider, MD  metoprolol (LOPRESSOR) 50 MG tablet Take  50 mg by mouth daily. 04/25/15  Yes Historical Provider, MD  naproxen sodium (ANAPROX) 220 MG tablet Take 440 mg by mouth 4 (four) times daily as needed (pain).   Yes Historical Provider, MD  OLANZapine (ZYPREXA) 10 MG tablet Take 10 mg by mouth daily. 04/27/15  Yes Historical Provider, MD  QUEtiapine (SEROQUEL) 100 MG tablet Take 100 mg by mouth daily. 06/06/15  Yes Historical Provider, MD  traZODone (DESYREL) 150 MG tablet Take 1 tablet (150 mg total) by mouth at bedtime. 03/23/15  Yes  Golden Circle, FNP  cephALEXin (KEFLEX) 500 MG capsule Take 1 capsule (500 mg total) by mouth 4 (four) times daily. 06/08/15   Forde Dandy, MD  furosemide (LASIX) 20 MG tablet Take 1 tablet (20 mg total) by mouth daily. 06/08/15   Forde Dandy, MD  sulfamethoxazole-trimethoprim (BACTRIM DS,SEPTRA DS) 800-160 MG tablet Take 1 tablet by mouth 2 (two) times daily. 06/08/15 06/15/15  Forde Dandy, MD   BP 120/68 mmHg  Pulse 86  Temp(Src) 97.9 F (36.6 C) (Oral)  Resp 18  Ht 5\' 3"  (1.6 m)  Wt 215 lb (97.523 kg)  BMI 38.09 kg/m2  SpO2 98% Physical Exam Physical Exam  Nursing note and vitals reviewed. Constitutional: Well developed, well nourished, non-toxic, and in no acute distress Head: Normocephalic and atraumatic.  Mouth/Throat: Oropharynx is clear and moist.  Neck: Normal range of motion. Neck supple.  Cardiovascular: Normal rate and regular rhythm.   Pulmonary/Chest: Effort normal and breath sounds normal.  Abdominal: Soft. Obese, with moderate distension. There is no tenderness. There is no rebound and no guarding.  Musculoskeletal: +2 Pitting edema involving bilateral LE, left > right, mild erythema and warm overlying the LLE Neurological: Alert, no facial droop, fluent speech, moves all extremities symmetrically Skin: Skin is warm and dry.  Psychiatric: Cooperative  ED Course  Procedures (including critical care time) Labs Review Labs Reviewed  CBC WITH DIFFERENTIAL/PLATELET - Abnormal; Notable for the following:    RBC 3.85 (*)    Hemoglobin 11.7 (*)    All other components within normal limits  BASIC METABOLIC PANEL - Abnormal; Notable for the following:    Sodium 146 (*)    All other components within normal limits  BRAIN NATRIURETIC PEPTIDE  I-STAT TROPOININ, ED    Imaging Review Dg Chest 2 View  06/08/2015  CLINICAL DATA:  Shortness of breath with cough EXAM: CHEST  2 VIEW COMPARISON:  May 09, 2015 FINDINGS: There is atelectatic change in the left base. Lungs  elsewhere are clear. Heart size and pulmonary vascularity are normal. No adenopathy. No bone lesions. IMPRESSION: Atelectatic change left base. This finding potentially may indicate early pneumonia in the left base. Lungs elsewhere clear. Stable cardiac silhouette. Electronically Signed   By: Lowella Grip III M.D.   On: 06/08/2015 11:12   I have personally reviewed and evaluated these images and lab results as part of my medical decision-making.   EKG Interpretation   Date/Time:  Friday June 08 2015 10:04:09 EDT Ventricular Rate:  94 PR Interval:  148 QRS Duration: 92 QT Interval:  394 QTC Calculation: 493 R Axis:   -8 Text Interpretation:  Sinus rhythm Low voltage, precordial leads RSR' in  V1 or V2, right VCD or RVH Borderline prolonged QT interval No acute  changes since last EKG  Confirmed by Hebah Bogosian MD, Letoya Stallone [id number]) on 06/08/2015  11:18:46 AM      MDM   Final diagnoses:  Bilateral lower extremity edema  65 year old female who presents with bilateral lower extremity edema. On presentation is nontoxic in no acute distress. Vital signs are non-concerning here in the emergency department. Suspect that her edema may be due to noncompliance with her Lasix specially in the setting of mild systolic heart failure on last echo in 2016. She does have bilateral +2 pitting edema in her lower extremities, left mildly more than the right. There is some erythema and warmth overlying her left lower extremities anteriorly and question of possible early cellulitis with some venous insufficiency. She has unremarkable chest x-ray with question of atelectasis versus pneumonia at the left base. She denies any cough, fevers or any other clinical symptoms of pneumonia and I do not think she needs treatment for this currently. Her BNP is normal. No chest pain or symptoms concerning for ACS. Low suspicion for PE. She is able to ambulate without significant SOB/DOE, hypoxia or tachycardia. Blood work other  wise unremarkable. No leukocytosis, metabolic or electrolyte derangements. Will restart lasix and will empirically treat for early cellulitis with course of bactrim and kelfex. Strict return and follow-up instructions reviewed. She expressed understanding of all discharge instructions and felt comfortable with the plan of care.     Forde Dandy, MD 06/08/15 1758

## 2015-06-08 NOTE — ED Notes (Signed)
Pt only gave me one chance to stick - only able to get 0.5cc - will wait for iv team.  Will inform RN and EDP.

## 2015-06-08 NOTE — ED Notes (Signed)
Bed: [id number] Expected date:  Expected time:  Means of arrival:  Comments: EMS-foot swelling

## 2015-06-08 NOTE — ED Notes (Signed)
Vascular tech at bedside doing venous doppler.

## 2015-06-08 NOTE — Discharge Instructions (Signed)
You are started on lasix for your swelling in your legs. You may also have a little bit of a cellulitis involving the legs as well, and you are written for a course of antibiotics.  Please take medications as prescribed. Return without fail for worsening symptoms, including fever, difficulty breathing, chest pain, or any other symptoms concerning to you. Please follow-up with your primary care doctor in the next 3-4 days for close re-evaluation.  Peripheral Edema You have swelling in your legs (peripheral edema). This swelling is due to excess accumulation of salt and water in your body. Edema may be a sign of heart, kidney or liver disease, or a side effect of a medication. It may also be due to problems in the leg veins. Elevating your legs and using special support stockings may be very helpful, if the cause of the swelling is due to poor venous circulation. Avoid long periods of standing, whatever the cause. Treatment of edema depends on identifying the cause. Chips, pretzels, pickles and other salty foods should be avoided. Restricting salt in your diet is almost always needed. Water pills (diuretics) are often used to remove the excess salt and water from your body via urine. These medicines prevent the kidney from reabsorbing sodium. This increases urine flow. Diuretic treatment may also result in lowering of potassium levels in your body. Potassium supplements may be needed if you have to use diuretics daily. Daily weights can help you keep track of your progress in clearing your edema. You should call your caregiver for follow up care as recommended. SEEK IMMEDIATE MEDICAL CARE IF:   You have increased swelling, pain, redness, or heat in your legs.  You develop shortness of breath, especially when lying down.  You develop chest or abdominal pain, weakness, or fainting.  You have a fever.   This information is not intended to replace advice given to you by your health care provider. Make sure  you discuss any questions you have with your health care provider.   Document Released: 03/13/2004 Document Revised: 04/28/2011 Document Reviewed: 08/16/2014 Elsevier Interactive Patient Education [employer].

## 2015-06-12 ENCOUNTER — Telehealth (HOSPITAL_COMMUNITY): Payer: Self-pay | Admitting: Radiology

## 2015-06-12 NOTE — Telephone Encounter (Signed)
Spoke with patient. Will call back to reschedule missed echo appointment

## 2015-06-13 ENCOUNTER — Telehealth: Payer: Self-pay | Admitting: Cardiology

## 2015-06-13 NOTE — Telephone Encounter (Signed)
06-13-15 Lvm to call and schedule Echo/saf

## 2015-06-14 ENCOUNTER — Ambulatory Visit: Payer: Self-pay | Admitting: Family

## 2015-06-20 ENCOUNTER — Inpatient Hospital Stay: Payer: Medicare Other | Admitting: Family

## 2015-06-20 DIAGNOSIS — Z0289 Encounter for other administrative examinations: Secondary | ICD-10-CM

## 2015-06-26 ENCOUNTER — Ambulatory Visit (INDEPENDENT_AMBULATORY_CARE_PROVIDER_SITE_OTHER): Payer: Medicare Other | Admitting: Family

## 2015-06-26 ENCOUNTER — Encounter: Payer: Self-pay | Admitting: Family

## 2015-06-26 VITALS — BP 108/68 | HR 106 | Temp 97.9°F | Resp 16 | Ht 63.0 in | Wt 215.0 lb

## 2015-06-26 DIAGNOSIS — R600 Localized edema: Secondary | ICD-10-CM | POA: Diagnosis not present

## 2015-06-26 DIAGNOSIS — I5032 Chronic diastolic (congestive) heart failure: Secondary | ICD-10-CM | POA: Insufficient documentation

## 2015-06-26 DIAGNOSIS — I5022 Chronic systolic (congestive) heart failure: Secondary | ICD-10-CM

## 2015-06-26 MED ORDER — FUROSEMIDE 40 MG PO TABS: 40.0000 mg | ORAL_TABLET | Freq: Every day | ORAL | Status: DC

## 2015-06-26 NOTE — Assessment & Plan Note (Signed)
Appears NYHA Class I with improved volume status since starting furosemide. Encouraged to weigh daily and follow up with cardiology. Obtain 2D echo previously ordered.

## 2015-06-26 NOTE — Assessment & Plan Note (Signed)
Bilateral lower extremity edema improved with furosemide. Most likely related to co-morbidities include OSA and mild systolic heart failure. Increase furosemide. Recheck potassium levels in 2 weeks. Encouraged to weigh at home and decrease sodium in her diet.

## 2015-06-26 NOTE — Patient Instructions (Signed)
Thank you for choosing [facility].  Summary/Instructions:  Continue to take your medications as prescribed.  Increased furosemide to 40 mg daily.  Your prescription(s) have been submitted to your pharmacy or been printed and provided for you. Please take as directed and contact our office if you believe you are having problem(s) with the medication(s) or have any questions.   If your symptoms worsen or fail to improve, please contact our office for further instruction, or in case of emergency go directly to the emergency room at the closest medical facility.

## 2015-06-26 NOTE — Progress Notes (Signed)
Subjective:    Patient ID: Kelsey Hanna, female    DOB: 1950/07/14, 65 y.o.   MRN: [id number]  Chief Complaint  Patient presents with  . Hospitalization Follow-up    still has some swelling in her feet but it has improved alot, thinks she needs lasix increased, still feels filled with fluid all over her body and her face, imparing her vision     HPI:  Kelsey Hanna is a 65 y.o. female who  has a past medical history of Bipolar 1 disorder ([place]); Hypothyroid; Dyslipidemia; Retinal detachment; Sleep apnea; Diabetes mellitus; Cancer ([place]); Chicken pox; and Depression. and presents today For an office follow-up.  This is a new problem. Recently evaluated in the emergency department with bilateral lower extremity edema and shortness of breath. Noted that over the past week prior to presentation she had gradual worsening of lower extremity edema and pain in her lower extremities. There is also increased abdominal distention and some shortness of breath with activity. Sleeping on 2 pillows at night which is her baseline. Denied fever, chest pain, nausea/vomiting, diarrhea, or urinary complaints. EKG showed normal sinus rhythm. Bilateral lower extremity edema suspected to be associated with noncompliance with her Lasix in the setting of mild systolic heart failure. There are questionable findings of possible early cellulitis with some venous insufficiency. Chest chest x-ray with question of atelectasis versus pneumonia left lung base. BNP was in the normal ranges. She was restarted on Lasix and appear to be treated for cellulitis of the course of Bactrim and Keflex. All hospital records, imaging, and labs were reviewed in detail.  Since leaving the hospital she continues to experience mild swelling in her feet but has improved significantly. Describes feelings of fluid all over her body and face and possibly impairing her vision. Reports taking the Lasix as prescribed without adverse side effects  and believes the medication may need to be at increased. Denies any further incidence of shortness of breath. Denies chest pain. Endorses that she is exercising doing water aerobics and cardiovascular activities. Previously had CPAP at night but does not use it because she is unable to sleep.    No Known Allergies   Current Outpatient Prescriptions on File Prior to Visit  Medication Sig Dispense Refill  . atorvastatin (LIPITOR) 40 MG tablet Take 40 mg by mouth daily.    . buprenorphine-naloxone (SUBOXONE) 2-0.5 MG SUBL SL tablet Place 1 tablet under the tongue daily.    . cephALEXin (KEFLEX) 500 MG capsule Take 1 capsule (500 mg total) by mouth 4 (four) times daily. 28 capsule 0  . cyclobenzaprine (FLEXERIL) 5 MG tablet TAKE 1 TABLET BY MOUTH THREE TIMES A DAY AS NEEDED FOR MUSCLE SPASM 90 tablet 0  . diphenhydramine-acetaminophen (TYLENOL PM) 25-500 MG TABS tablet Take 2 tablets by mouth at bedtime as needed (pain/sleep).    . divalproex (DEPAKOTE ER) 500 MG 24 hr tablet Take 500 mg by mouth 2 (two) times daily.    Marland Kitchen levothyroxine (SYNTHROID) 137 MCG tablet Take 1 tablet (137 mcg total) by mouth daily before breakfast. 90 tablet 1  . loxapine (LOXITANE) 10 MG capsule Take 10 mg by mouth at bedtime.    . metoprolol (LOPRESSOR) 50 MG tablet Take 50 mg by mouth daily.    . naproxen sodium (ANAPROX) 220 MG tablet Take 440 mg by mouth 4 (four) times daily as needed (pain).    Marland Kitchen OLANZapine (ZYPREXA) 10 MG tablet Take 10 mg by mouth daily.    Marland Kitchen  QUEtiapine (SEROQUEL) 100 MG tablet Take 100 mg by mouth daily.    . traZODone (DESYREL) 150 MG tablet Take 1 tablet (150 mg total) by mouth at bedtime. 90 tablet 1   No current facility-administered medications on file prior to visit.    Past Medical History  Diagnosis Date  . Bipolar 1 disorder ([place])   . Hypothyroid   . Dyslipidemia   . Retinal detachment   . Sleep apnea   . Diabetes mellitus     dx within last yr....just takes pills  . Cancer  [facility])     cancer of uterus...no chemo or radiation  . Chicken pox   . Depression      Past Surgical History  Procedure Laterality Date  . Abdominal hysterectomy    . Total abdominal hysterectomy w/ bilateral salpingoophorectomy    . Tonsillectomy    . Lumbar laminectomy/decompression microdiscectomy  07/17/2011    Procedure: LUMBAR LAMINECTOMY/DECOMPRESSION MICRODISCECTOMY;  Surgeon: Sinclair Ship, MD;  Location: [facility];  Service: Orthopedics;  Laterality: Left;  Left sided lumbar 4-5 microdisectomy    Review of Systems  Constitutional: Negative for fever and chills.  Respiratory: Negative for chest tightness, shortness of breath and wheezing.   Cardiovascular: Positive for leg swelling. Negative for chest pain and palpitations.      Objective:    BP 108/68 mmHg  Pulse 106  Temp(Src) 97.9 F (36.6 C) (Oral)  Resp 16  Ht 5\' 3"  (1.6 m)  Wt 215 lb (97.523 kg)  BMI 38.09 kg/m2  SpO2 95% Nursing note and vital signs reviewed.  Physical Exam  Constitutional: She is oriented to person, place, and time. She appears well-developed and well-nourished. No distress.  Cardiovascular: Normal rate, regular rhythm, normal heart sounds and intact distal pulses.   Mild/moderate non-pitting edema located in her bilateral lower extremities with the left greater than the right. Distal pulses are palpable and appropriate.   Pulmonary/Chest: Effort normal and breath sounds normal. She has no wheezes. She has no rales. She exhibits no tenderness.  Neurological: She is alert and oriented to person, place, and time.  Skin: Skin is warm and dry.  Psychiatric: She has a normal mood and affect. Her behavior is normal. Judgment and thought content normal.       Assessment & Plan:   Problem List Items Addressed This Visit      Cardiovascular and Mediastinum   Chronic systolic heart failure (HCC) - Primary    Appears NYHA Class I with improved volume status since starting furosemide.  Encouraged to weigh daily and follow up with cardiology. Obtain 2D echo previously ordered.       Relevant Medications   furosemide (LASIX) 40 MG tablet   Other Relevant Orders   Echocardiogram     Other   Bilateral lower extremity edema    Bilateral lower extremity edema improved with furosemide. Most likely related to co-morbidities include OSA and mild systolic heart failure. Increase furosemide. Recheck potassium levels in 2 weeks. Encouraged to weigh at home and decrease sodium in her diet.       Relevant Medications   furosemide (LASIX) 40 MG tablet   Other Relevant Orders   Echocardiogram       I am having Ms. Carlye Grippe maintain her levothyroxine, traZODone, divalproex, OLANZapine, loxapine, cyclobenzaprine, atorvastatin, metoprolol, buprenorphine-naloxone, QUEtiapine, naproxen sodium, diphenhydramine-acetaminophen, cephALEXin, and furosemide.   Meds ordered this encounter  Medications  . DISCONTD: furosemide (LASIX) 40 MG tablet    Sig: Take 1 tablet (40  mg total) by mouth daily.    Dispense:  30 tablet    Refill:  2    Order Specific Question:  Supervising Provider    Answer:  Pricilla Holm A [id number]  . furosemide (LASIX) 40 MG tablet    Sig: Take 1 tablet (40 mg total) by mouth daily.    Dispense:  30 tablet    Refill:  2    Order Specific Question:  Supervising Provider    Answer:  Pricilla Holm A [id number]     Follow-up: Return in about 2 weeks (around 07/10/2015) for Electrolyte check / ear flush.  Mauricio Po, FNP

## 2015-07-03 ENCOUNTER — Other Ambulatory Visit: Payer: Self-pay

## 2015-07-03 ENCOUNTER — Ambulatory Visit (HOSPITAL_COMMUNITY): Payer: Medicare Other | Attending: Cardiology

## 2015-07-03 DIAGNOSIS — Z720 Tobacco use: Secondary | ICD-10-CM | POA: Insufficient documentation

## 2015-07-03 DIAGNOSIS — I5022 Chronic systolic (congestive) heart failure: Secondary | ICD-10-CM | POA: Insufficient documentation

## 2015-07-03 DIAGNOSIS — R600 Localized edema: Secondary | ICD-10-CM | POA: Diagnosis not present

## 2015-07-03 DIAGNOSIS — E669 Obesity, unspecified: Secondary | ICD-10-CM | POA: Diagnosis not present

## 2015-07-03 DIAGNOSIS — Z6838 Body mass index (BMI) 38.0-38.9, adult: Secondary | ICD-10-CM | POA: Insufficient documentation

## 2015-07-03 DIAGNOSIS — E119 Type 2 diabetes mellitus without complications: Secondary | ICD-10-CM | POA: Insufficient documentation

## 2015-07-03 DIAGNOSIS — I071 Rheumatic tricuspid insufficiency: Secondary | ICD-10-CM | POA: Diagnosis not present

## 2015-07-03 DIAGNOSIS — I509 Heart failure, unspecified: Secondary | ICD-10-CM | POA: Diagnosis present

## 2015-07-04 ENCOUNTER — Telehealth: Payer: Self-pay | Admitting: Family

## 2015-07-04 NOTE — Telephone Encounter (Signed)
Please inform patient that her echocardiogram was within normal ranges and not clear evidence of lower extremity swelling she was experiencing. She could possibly be due to her sleep apnea as we discussed during her office visit.

## 2015-07-06 NOTE — Telephone Encounter (Signed)
LVM letting pt know.  

## 2015-07-10 ENCOUNTER — Ambulatory Visit (INDEPENDENT_AMBULATORY_CARE_PROVIDER_SITE_OTHER): Payer: Medicare Other | Admitting: Family

## 2015-07-10 ENCOUNTER — Other Ambulatory Visit (INDEPENDENT_AMBULATORY_CARE_PROVIDER_SITE_OTHER): Payer: Medicare Other

## 2015-07-10 ENCOUNTER — Encounter: Payer: Self-pay | Admitting: Family

## 2015-07-10 ENCOUNTER — Telehealth: Payer: Self-pay | Admitting: Family

## 2015-07-10 VITALS — BP 110/70 | HR 89 | Temp 98.2°F | Resp 16 | Ht 63.0 in | Wt 215.0 lb

## 2015-07-10 DIAGNOSIS — G4700 Insomnia, unspecified: Secondary | ICD-10-CM | POA: Diagnosis not present

## 2015-07-10 DIAGNOSIS — R600 Localized edema: Secondary | ICD-10-CM

## 2015-07-10 LAB — BASIC METABOLIC PANEL
BUN: 11 mg/dL (ref 6–23)
CHLORIDE: 103 meq/L (ref 96–112)
CO2: 36 meq/L — AB (ref 19–32)
Calcium: 9.4 mg/dL (ref 8.4–10.5)
Creatinine, Ser: 0.79 mg/dL (ref 0.40–1.20)
GFR: 77.31 mL/min (ref >60.00)
GLUCOSE: 85 mg/dL (ref 70–99)
POTASSIUM: 3.9 meq/L (ref 3.5–5.1)
SODIUM: 143 meq/L (ref 135–145)

## 2015-07-10 MED ORDER — ZOLPIDEM TARTRATE 5 MG PO TABS: 5.0000 mg | ORAL_TABLET | Freq: Every evening | ORAL | Status: DC | PRN

## 2015-07-10 NOTE — Progress Notes (Signed)
Pre visit review using our clinic review tool, if applicable. No additional management support is needed unless otherwise documented below in the visit note. 

## 2015-07-10 NOTE — Patient Instructions (Signed)
Thank you for choosing [facility].  Summary/Instructions:  Please STOP taking Trazodone. Start Ambien nightly as needed.  Continue other medications as prescribed.   Your prescription(s) have been submitted to your pharmacy or been printed and provided for you. Please take as directed and contact our office if you believe you are having problem(s) with the medication(s) or have any questions.  Please stop by the lab on the basement level of the building for your blood work. Your results will be released to [place] (or called to you) after review, usually within 72 hours after test completion. If any changes need to be made, you will be notified at that same time.  If your symptoms worsen or fail to improve, please contact our office for further instruction, or in case of emergency go directly to the emergency room at the closest medical facility.   Recommendations for improving sleep:   Avoid having pets sleep in the bedroom  Avoid caffeine consumption after 4pm  Keep bedroom cool and conducive to sleep  Avoid nicotine use, especially in the evening  Avoid exercise within 2-3 hours before bedtime  Stimulus Control:   Go to bed only when sleepy  Use the bedroom for sleep and sex only  Go to another room if you are unable to fall asleep within 15 to 20 minutes  Read or engage in other quiet activities and return to bed only when sleepy.

## 2015-07-10 NOTE — Progress Notes (Signed)
Subjective:    Patient ID: Kelsey Hanna, female    DOB: 12/09/1950, 65 y.o.   MRN: [id number]  Chief Complaint  Patient presents with  . Follow-up    states that the lasix increase has helped alot with the swelling, having trouble sleeping would like a sleep aid    HPI:  Kelsey Hanna is a 65 y.o. female who  has a past medical history of Bipolar 1 disorder ([place]); Hypothyroid; Dyslipidemia; Retinal detachment; Sleep apnea; Diabetes mellitus; Cancer ([place]); Chicken pox; and Depression. and presents today for an office follow up.   1.) Lower extremity edema - Previously evaluated in the office for lower extremity edema with increase in furosemide. Reports taken medications prescribed and denies adverse side effects. Notes the increase in medication has helped significantly with her lower extremity swelling.   2.) Sleep disturbance - Currently maintained on trazadone. Reports taking the medication as prescribed and denies adverse side effects. Notes that she continues to have difficulty with sleeping as she is averaging about 3 hours of sleep per night. Describes difficulty falling asleep. She is also on Seroquel which she indicates is not helping very much. Describes that it is her mind that keeps her awake.    No Known Allergies   Current Outpatient Prescriptions on File Prior to Visit  Medication Sig Dispense Refill  . atorvastatin (LIPITOR) 40 MG tablet Take 40 mg by mouth daily.    . buprenorphine-naloxone (SUBOXONE) 2-0.5 MG SUBL SL tablet Place 1 tablet under the tongue daily.    . cyclobenzaprine (FLEXERIL) 5 MG tablet TAKE 1 TABLET BY MOUTH THREE TIMES A DAY AS NEEDED FOR MUSCLE SPASM 90 tablet 0  . diphenhydramine-acetaminophen (TYLENOL PM) 25-500 MG TABS tablet Take 2 tablets by mouth at bedtime as needed (pain/sleep).    . divalproex (DEPAKOTE ER) 500 MG 24 hr tablet Take 500 mg by mouth 2 (two) times daily.    . furosemide (LASIX) 40 MG tablet Take 1 tablet (40 mg  total) by mouth daily. 30 tablet 2  . levothyroxine (SYNTHROID) 137 MCG tablet Take 1 tablet (137 mcg total) by mouth daily before breakfast. 90 tablet 1  . loxapine (LOXITANE) 10 MG capsule Take 10 mg by mouth at bedtime.    . metoprolol (LOPRESSOR) 50 MG tablet Take 50 mg by mouth daily.    . naproxen sodium (ANAPROX) 220 MG tablet Take 440 mg by mouth 4 (four) times daily as needed (pain).    Marland Kitchen OLANZapine (ZYPREXA) 10 MG tablet Take 10 mg by mouth daily.    . QUEtiapine (SEROQUEL) 100 MG tablet Take 100 mg by mouth daily.     No current facility-administered medications on file prior to visit.    Review of Systems  Constitutional: Negative for fever and chills.  Respiratory: Negative for chest tightness and shortness of breath.   Psychiatric/Behavioral: Positive for sleep disturbance.      Objective:    BP 110/70 mmHg  Pulse 89  Temp(Src) 98.2 F (36.8 C) (Oral)  Resp 16  Ht 5\' 3"  (1.6 m)  Wt 215 lb (97.523 kg)  BMI 38.09 kg/m2  SpO2 95% Nursing note and vital signs reviewed.  Physical Exam  Constitutional: She is oriented to person, place, and time. She appears well-developed and well-nourished. No distress.  Cardiovascular: Normal rate, regular rhythm, normal heart sounds and intact distal pulses.   Mild bilateral lower extremity edema nonpitting.  Pulmonary/Chest: Effort normal and breath sounds normal.  Neurological: She is alert  and oriented to person, place, and time.  Skin: Skin is warm and dry.  Psychiatric: She has a normal mood and affect. Her behavior is normal. Judgment and thought content normal.       Assessment & Plan:   Problem List Items Addressed This Visit      Other   Insomnia    Insomnia remains uncontrolled with trazodone and sleeping only about 3 hours per night with difficulty falling asleep. Start Ambien. Follow-up in one month or sooner if needed.      Relevant Medications   zolpidem (AMBIEN) 5 MG tablet   Bilateral lower extremity  edema - Primary    Bilateral lower extremity edema improved with Lasix. Obtain BMET to check electrolytes. Continue current dosage of Lasix.      Relevant Orders   Basic Metabolic Panel (BMET)       I have discontinued Ms. Lillibridge's traZODone and cephALEXin. I am also having her start on zolpidem. Additionally, I am having her maintain her levothyroxine, divalproex, OLANZapine, loxapine, cyclobenzaprine, atorvastatin, metoprolol, buprenorphine-naloxone, QUEtiapine, naproxen sodium, diphenhydramine-acetaminophen, and furosemide.   Meds ordered this encounter  Medications  . zolpidem (AMBIEN) 5 MG tablet    Sig: Take 1 tablet (5 mg total) by mouth at bedtime as needed for sleep.    Dispense:  30 tablet    Refill:  0    Order Specific Question:  Supervising Provider    Answer:  Pricilla Holm A [id number]     Follow-up: Return if symptoms worsen or fail to improve.  Mauricio Po, FNP

## 2015-07-10 NOTE — Assessment & Plan Note (Signed)
Insomnia remains uncontrolled with trazodone and sleeping only about 3 hours per night with difficulty falling asleep. Start Ambien. Follow-up in one month or sooner if needed.

## 2015-07-10 NOTE — Assessment & Plan Note (Signed)
Bilateral lower extremity edema improved with Lasix. Obtain BMET to check electrolytes. Continue current dosage of Lasix.

## 2015-07-10 NOTE — Telephone Encounter (Signed)
Please inform patient that her potassium levels are stable and may continue with the lasix.

## 2015-07-11 ENCOUNTER — Ambulatory Visit: Payer: Self-pay | Admitting: Family

## 2015-07-11 NOTE — Telephone Encounter (Signed)
Pt aware of results 

## 2015-07-12 ENCOUNTER — Telehealth: Payer: Self-pay

## 2015-07-12 NOTE — Telephone Encounter (Signed)
Patient called and just want to let Dr.Calone that she is finally sleeping good at night. And she just wanted to say [place].

## 2015-07-12 NOTE — Telephone Encounter (Signed)
Noted  

## 2015-07-26 ENCOUNTER — Ambulatory Visit: Payer: Self-pay | Admitting: Family

## 2015-07-30 ENCOUNTER — Ambulatory Visit: Payer: Self-pay | Admitting: Family

## 2015-07-30 ENCOUNTER — Ambulatory Visit (INDEPENDENT_AMBULATORY_CARE_PROVIDER_SITE_OTHER): Payer: Medicare Other | Admitting: Family

## 2015-07-30 ENCOUNTER — Other Ambulatory Visit (INDEPENDENT_AMBULATORY_CARE_PROVIDER_SITE_OTHER): Payer: Medicare Other

## 2015-07-30 ENCOUNTER — Encounter: Payer: Self-pay | Admitting: Family

## 2015-07-30 VITALS — BP 102/70 | HR 62 | Resp 18 | Ht 63.0 in | Wt 204.0 lb

## 2015-07-30 DIAGNOSIS — R42 Dizziness and giddiness: Secondary | ICD-10-CM | POA: Diagnosis not present

## 2015-07-30 DIAGNOSIS — K5900 Constipation, unspecified: Secondary | ICD-10-CM

## 2015-07-30 LAB — COMPREHENSIVE METABOLIC PANEL
ALBUMIN: 4.6 g/dL (ref 3.5–5.2)
ALK PHOS: 55 U/L (ref 39–117)
ALT: 13 U/L (ref 0–35)
AST: 17 U/L (ref 0–37)
BUN: 20 mg/dL (ref 6–23)
CO2: 30 mEq/L (ref 19–32)
CREATININE: 1.04 mg/dL (ref 0.40–1.20)
Calcium: 10.1 mg/dL (ref 8.4–10.5)
Chloride: 94 mEq/L — ABNORMAL LOW (ref 96–112)
GFR: 56.28 mL/min — ABNORMAL LOW (ref >60.00)
Glucose, Bld: 141 mg/dL — ABNORMAL HIGH (ref 70–99)
Potassium: 3.8 mEq/L (ref 3.5–5.1)
SODIUM: 137 meq/L (ref 135–145)
TOTAL PROTEIN: 7.4 g/dL (ref 6.0–8.3)
Total Bilirubin: 0.4 mg/dL (ref 0.2–1.2)

## 2015-07-30 LAB — CBC
HEMATOCRIT: 43.8 % (ref 36.0–46.0)
Hemoglobin: 14.5 g/dL (ref 12.0–15.0)
MCHC: 33.2 g/dL (ref 30.0–36.0)
MCV: 91.5 fl (ref 78.0–100.0)
Platelets: 226 10*3/uL (ref 150.0–400.0)
RBC: 4.78 Mil/uL (ref 3.87–5.11)
RDW: 13.7 % (ref 11.5–15.5)
WBC: 11.5 10*3/uL — AB (ref 4.0–10.5)

## 2015-07-30 MED ORDER — POLYETHYLENE GLYCOL 3350 17 GM/SCOOP PO POWD: 17.0000 g | Freq: Every day | ORAL | Status: DC

## 2015-07-30 NOTE — Addendum Note (Signed)
Addended by: Mauricio Po D on: 07/30/2015 08:20 PM   Modules accepted: Level of Service

## 2015-07-30 NOTE — Progress Notes (Signed)
Pre visit review using our clinic review tool, if applicable. No additional management support is needed unless otherwise documented below in the visit note. 

## 2015-07-30 NOTE — Assessment & Plan Note (Signed)
Dizziness with concern for dehydration and possibly related to furosemide use. Patient does have a history of overusing medication. She does report taken the medications as prescribed. Obtain CBC, compressive metabolic panel, and valproic acid level. Encouraged to drink plenty of fluids. Follow up and additional treatment pending lab work. Return precautions given.

## 2015-07-30 NOTE — Assessment & Plan Note (Signed)
Constipation of underdetermined origin with self-described disimpaction. Has had a bowel movement in the past 1.5 days. Discontinue senna and start Miralax. Follow up if symptoms worsen or do not improve. Return precautions provided.

## 2015-07-30 NOTE — Progress Notes (Signed)
Subjective:    Patient ID: Kelsey Hanna, female    DOB: 1950-05-05, 65 y.o.   MRN: [id number]  Chief Complaint  Patient presents with  . Constipation    having trouble having a BM, says that the last one she had she had to self remove it, has not a BM in 5 days, has tried stool softners and they aren't working, having nausea and dizziness    HPI:  Kelsey Hanna is a 65 y.o. female who  has a past medical history of Bipolar 1 disorder ([place]); Hypothyroid; Dyslipidemia; Retinal detachment; Sleep apnea; Diabetes mellitus; Cancer ([place]); Chicken pox; and Depression. and presents today for an acute office visit.   1.) Contstipation - This is a new problem. Associated symptom of constipation that is refractory to the modifying factors of stool softeners and senna has been going on for a couple of weeks. Her last bowl movement was about 1.5  days ago and describes that she had a fecal impaction that she removed herself. States she does not drink a lot of water. Reports that she is eating well for her.   2.) Nausea and dizziness - This is a new problem. Associated symptom of nausea and dizziness has been going on since this morning. Described as like a migraine without the headache. With her eyes closed she feels like the room is spinning. Denies fevers or vomiting.   No Known Allergies   Current Outpatient Prescriptions on File Prior to Visit  Medication Sig Dispense Refill  . atorvastatin (LIPITOR) 40 MG tablet Take 40 mg by mouth daily.    . buprenorphine-naloxone (SUBOXONE) 2-0.5 MG SUBL SL tablet Place 1 tablet under the tongue daily.    . cyclobenzaprine (FLEXERIL) 5 MG tablet TAKE 1 TABLET BY MOUTH THREE TIMES A DAY AS NEEDED FOR MUSCLE SPASM 90 tablet 0  . diphenhydramine-acetaminophen (TYLENOL PM) 25-500 MG TABS tablet Take 2 tablets by mouth at bedtime as needed (pain/sleep).    . divalproex (DEPAKOTE ER) 500 MG 24 hr tablet Take 500 mg by mouth 2 (two) times daily.    .  furosemide (LASIX) 40 MG tablet Take 1 tablet (40 mg total) by mouth daily. 30 tablet 2  . levothyroxine (SYNTHROID) 137 MCG tablet Take 1 tablet (137 mcg total) by mouth daily before breakfast. 90 tablet 1  . loxapine (LOXITANE) 10 MG capsule Take 10 mg by mouth at bedtime.    . metoprolol (LOPRESSOR) 50 MG tablet Take 50 mg by mouth daily.    . naproxen sodium (ANAPROX) 220 MG tablet Take 440 mg by mouth 4 (four) times daily as needed (pain).    Marland Kitchen OLANZapine (ZYPREXA) 10 MG tablet Take 10 mg by mouth daily.    . QUEtiapine (SEROQUEL) 100 MG tablet Take 100 mg by mouth daily.    Marland Kitchen zolpidem (AMBIEN) 5 MG tablet Take 1 tablet (5 mg total) by mouth at bedtime as needed for sleep. 30 tablet 0   No current facility-administered medications on file prior to visit.     Past Surgical History  Procedure Laterality Date  . Abdominal hysterectomy    . Total abdominal hysterectomy w/ bilateral salpingoophorectomy    . Tonsillectomy    . Lumbar laminectomy/decompression microdiscectomy  07/17/2011    Procedure: LUMBAR LAMINECTOMY/DECOMPRESSION MICRODISCECTOMY;  Surgeon: Sinclair Ship, MD;  Location: [place];  Service: Orthopedics;  Laterality: Left;  Left sided lumbar 4-5 microdisectomy    Past Medical History  Diagnosis Date  . Bipolar 1  disorder ([place])   . Hypothyroid   . Dyslipidemia   . Retinal detachment   . Sleep apnea   . Diabetes mellitus     dx within last yr....just takes pills  . Cancer [facility])     cancer of uterus...no chemo or radiation  . Chicken pox   . Depression     Review of Systems  Constitutional: Negative for fever and chills.  Respiratory: Negative for chest tightness and shortness of breath.   Cardiovascular: Negative for chest pain, palpitations and leg swelling.  Gastrointestinal: Positive for nausea and constipation.  Neurological: Positive for dizziness.      Objective:    BP 102/70 mmHg  Pulse 62  Resp 18  Ht 5\' 3"  (1.6 m)  Wt 204 lb (92.534 kg)   BMI 36.15 kg/m2  SpO2 91% Nursing note and vital signs reviewed.  Physical Exam  Constitutional: She is oriented to person, place, and time. She appears well-developed and well-nourished. She is cooperative. She appears ill.  Cardiovascular: Normal rate, regular rhythm, normal heart sounds and intact distal pulses.   Pulmonary/Chest: Effort normal and breath sounds normal.  Neurological: She is alert and oriented to person, place, and time.  Skin: Skin is warm. She is diaphoretic.  Psychiatric: She has a normal mood and affect. Her behavior is normal. Judgment and thought content normal.       Assessment & Plan:   Problem List Items Addressed This Visit      Digestive   Constipation - Primary    Constipation of underdetermined origin with self-described disimpaction. Has had a bowel movement in the past 1.5 days. Discontinue senna and start Miralax. Follow up if symptoms worsen or do not improve. Return precautions provided.       Relevant Medications   polyethylene glycol powder (GLYCOLAX/MIRALAX) powder     Other   Dizziness    Dizziness with concern for dehydration and possibly related to furosemide use. Patient does have a history of overusing medication. She does report taken the medications as prescribed. Obtain CBC, compressive metabolic panel, and valproic acid level. Encouraged to drink plenty of fluids. Follow up and additional treatment pending lab work. Return precautions given.       Relevant Orders   Comprehensive metabolic panel (Completed)   CBC (Completed)   Valproic Acid level       I am having Kelsey Hanna start on polyethylene glycol powder. I am also having her maintain her levothyroxine, divalproex, OLANZapine, loxapine, cyclobenzaprine, atorvastatin, metoprolol, buprenorphine-naloxone, QUEtiapine, naproxen sodium, diphenhydramine-acetaminophen, furosemide, and zolpidem.   Meds ordered this encounter  Medications  . polyethylene glycol powder  (GLYCOLAX/MIRALAX) powder    Sig: Take 17 g by mouth daily.    Dispense:  225 g    Refill:  1    Order Specific Question:  Supervising Provider    Answer:  Pricilla Holm A [id number]     Follow-up: Return if symptoms worsen or fail to improve.  Mauricio Po, FNP   ADDENDUM: Following office visit NP student escorted patient to lab for lab work noting that patient began to be more forgetful regarding instructions just discussed. Upon discussion it was advised that patient she seek further care in the ED which the patient vehemently refused. Following this encounter the patient appeared to be more collected.

## 2015-07-30 NOTE — Patient Instructions (Addendum)
Thank you for choosing [facility].  Summary/Instructions:  Please continue to take your medications as prescribed.   Use the Miralax as needed for constipation.  DO NOT TAKE THE THE SENNA and MIRALAX together.  Your prescription(s) have been submitted to your pharmacy or been printed and provided for you. Please take as directed and contact our office if you believe you are having problem(s) with the medication(s) or have any questions.  Please stop by the lab on the basement level of the building for your blood work. Your results will be released to [place] (or called to you) after review, usually within 72 hours after test completion. If any changes need to be made, you will be notified at that same time.  If your symptoms worsen or fail to improve, please contact our office for further instruction, or in case of emergency go directly to the emergency room at the closest medical facility.

## 2015-07-31 ENCOUNTER — Telehealth: Payer: Self-pay | Admitting: Family

## 2015-07-31 LAB — VALPROIC ACID LEVEL: Valproic Acid Lvl: 67.5 ug/mL (ref 50.0–100.0)

## 2015-07-31 NOTE — Telephone Encounter (Signed)
Please inform patient that her WBC count was slightly elevated indicated she may have a mild infection/virus. Otherwise her blood work is within normal limits.

## 2015-08-01 NOTE — Telephone Encounter (Signed)
LVM letting pt know.  

## 2015-08-02 ENCOUNTER — Ambulatory Visit: Payer: Medicare Other | Admitting: Family

## 2015-08-03 ENCOUNTER — Ambulatory Visit: Payer: Medicare Other | Admitting: Family

## 2015-08-06 ENCOUNTER — Other Ambulatory Visit: Payer: Self-pay | Admitting: Family

## 2015-08-23 ENCOUNTER — Other Ambulatory Visit: Payer: Self-pay | Admitting: Family

## 2015-08-27 ENCOUNTER — Ambulatory Visit (HOSPITAL_BASED_OUTPATIENT_CLINIC_OR_DEPARTMENT_OTHER): Payer: Medicare Other | Attending: Anesthesiology | Admitting: Internal Medicine

## 2015-08-27 VITALS — Ht 63.0 in | Wt 165.0 lb

## 2015-08-27 DIAGNOSIS — Z79899 Other long term (current) drug therapy: Secondary | ICD-10-CM | POA: Diagnosis not present

## 2015-08-27 DIAGNOSIS — I493 Ventricular premature depolarization: Secondary | ICD-10-CM | POA: Diagnosis not present

## 2015-08-27 DIAGNOSIS — R5383 Other fatigue: Secondary | ICD-10-CM | POA: Insufficient documentation

## 2015-08-27 DIAGNOSIS — I10 Essential (primary) hypertension: Secondary | ICD-10-CM | POA: Insufficient documentation

## 2015-08-27 DIAGNOSIS — R0683 Snoring: Secondary | ICD-10-CM | POA: Insufficient documentation

## 2015-08-27 DIAGNOSIS — G4736 Sleep related hypoventilation in conditions classified elsewhere: Secondary | ICD-10-CM | POA: Diagnosis not present

## 2015-08-27 DIAGNOSIS — G4719 Other hypersomnia: Secondary | ICD-10-CM | POA: Diagnosis not present

## 2015-08-27 DIAGNOSIS — E119 Type 2 diabetes mellitus without complications: Secondary | ICD-10-CM | POA: Diagnosis not present

## 2015-08-27 DIAGNOSIS — G4710 Hypersomnia, unspecified: Secondary | ICD-10-CM

## 2015-09-02 DIAGNOSIS — R5383 Other fatigue: Secondary | ICD-10-CM

## 2015-09-02 DIAGNOSIS — G4710 Hypersomnia, unspecified: Secondary | ICD-10-CM | POA: Diagnosis not present

## 2015-09-02 NOTE — Procedures (Signed)
  Patient Name: Kelsey Hanna, Kelsey Hanna Date: 08/27/2015 Gender: Female D.O.B: [birth year] Age (years): 65 Referring Provider: Ivy Lynn Dakwa Height (inches): 63 Interpreting Physician: Baird Lyons MD, ABSM Weight (lbs): 165 RPSGT: Carolin Coy BMI: 29 MRN: [id number] Neck Size: 15.50 CLINICAL INFORMATION Sleep Study Type: NPSG Indication for sleep study: Diabetes, Excessive Daytime Sleepiness, Fatigue, Hypertension Epworth Sleepiness Score: 2  SLEEP STUDY TECHNIQUE As per the AASM Manual for the Scoring of Sleep and Associated Events v2.3 (April 2016) with a hypopnea requiring 4% desaturations. The channels recorded and monitored were frontal, central and occipital EEG, electrooculogram (EOG), submentalis EMG (chin), nasal and oral airflow, thoracic and abdominal wall motion, anterior tibialis EMG, snore microphone, electrocardiogram, and pulse oximetry.  MEDICATIONS Patient's medications include: charted for review Medications self-administered by patient during sleep study : SEROQUEL, ZYPREXA, AMBIEN, TRAZODONE, LOXAPINE, Theodore, FLEXERIL.  SLEEP ARCHITECTURE The study was initiated at 10:36:46 PM and ended at 4:48:47 AM. Sleep onset time was 5.4 minutes and the sleep efficiency was 91.5%. The total sleep time was 340.5 minutes. Stage REM latency was N/A minutes. The patient spent 4.85% of the night in stage N1 sleep, 95.15% in stage N2 sleep, 0.00% in stage N3 and 0.00% in REM. Alpha intrusion was absent. Supine sleep was 3.52%.  RESPIRATORY PARAMETERS The overall apnea/hypopnea index (AHI) was 2.6 per hour. There were 2 total apneas, including 1 obstructive, 0 central and 1 mixed apneas. There were 13 hypopneas and 5 RERAs. The AHI during Stage REM sleep was N/A per hour. AHI while supine was 20.0 per hour. The mean oxygen saturation was 90.69%. The minimum SpO2 during sleep was 78.00%. Soft snoring was noted during this study.  CARDIAC DATA The 2 lead EKG  demonstrated sinus rhythm. The mean heart rate was 87.72 beats per minute. Other EKG findings include: PVCs.  LEG MOVEMENT DATA The total PLMS were 58 with a resulting PLMS index of 10.22. Associated arousal with leg movement index was 0.0 .  IMPRESSIONS - No significant obstructive sleep apnea occurred during this study (AHI = 2.6/h). - No significant central sleep apnea occurred during this study (CAI = 0.0/h). - Moderate oxygen desaturation was noted during this study (Min O2 = 78.00%). - Supplemental O2 1L added at 1:16 AM fvor sustained saturation below 88%. - The patient snored with Soft snoring volume. - EKG findings include PVCs. - Mild periodic limb movements of sleep occurred during the study. No significant associated arousals. - Sleep pattern reflects heavily medicated patient as noted above.  DIAGNOSIS - Nocturnal Hypoxemia (327.26 [G47.36 ICD-10])  RECOMMENDATIONS - Consider reducing use of multiple sedating medications in this 65 yo. Medication -induced respiratory depression may contribute to hypoxemia. - Positional therapy avoiding supine position during sleep. - Avoid alcohol, sedatives and other CNS depressants that may worsen sleep apnea and disrupt normal sleep architecture. - Sleep hygiene should be reviewed to assess factors that may improve sleep quality. - Weight management and regular exercise should be initiated or continued if appropriate.  [Electronically signed] 09/02/2015 09:00 AM  Baird Lyons MD, ABSM Diplomate, American Board of Sleep Medicine   NPI: [id number]  [place], American Board of Sleep Medicine  ELECTRONICALLY SIGNED ON:  09/02/2015, 8:53 AM [place] PH: [phone]   FX: [phone] [place]

## 2015-09-03 ENCOUNTER — Other Ambulatory Visit: Payer: Self-pay | Admitting: Family

## 2015-09-07 ENCOUNTER — Telehealth: Payer: Self-pay

## 2015-09-07 ENCOUNTER — Ambulatory Visit (INDEPENDENT_AMBULATORY_CARE_PROVIDER_SITE_OTHER): Payer: Medicare Other | Admitting: Family

## 2015-09-07 ENCOUNTER — Encounter: Payer: Self-pay | Admitting: Family

## 2015-09-07 VITALS — BP 102/68 | HR 98 | Temp 97.9°F | Ht 63.0 in | Wt 211.0 lb

## 2015-09-07 DIAGNOSIS — K5900 Constipation, unspecified: Secondary | ICD-10-CM

## 2015-09-07 DIAGNOSIS — G4734 Idiopathic sleep related nonobstructive alveolar hypoventilation: Secondary | ICD-10-CM

## 2015-09-07 DIAGNOSIS — R600 Localized edema: Secondary | ICD-10-CM

## 2015-09-07 MED ORDER — LINACLOTIDE 145 MCG PO CAPS: 145.0000 ug | ORAL_CAPSULE | Freq: Every day | ORAL | Status: DC

## 2015-09-07 MED ORDER — ZOLPIDEM TARTRATE 5 MG PO TABS: 5.0000 mg | ORAL_TABLET | Freq: Every evening | ORAL | Status: DC | PRN

## 2015-09-07 MED ORDER — FUROSEMIDE 40 MG PO TABS: ORAL_TABLET | ORAL | Status: DC

## 2015-09-07 NOTE — Progress Notes (Signed)
Subjective:    Patient ID: Kelsey Hanna, female    DOB: 1951/02/07, 65 y.o.   MRN: [id number]  Chief Complaint  Patient presents with  . Constipation    Pt last BM was 4 days ago. Use Miralax.     HPI:  Kelsey Hanna is a 65 y.o. female who  has a past medical history of Bipolar 1 disorder ([place]); Hypothyroid; Dyslipidemia; Retinal detachment; Sleep apnea; Diabetes mellitus; Cancer ([place]); Chicken pox; and Depression. and presents today for a follow up office visit.  This is a chronic problem. Associated symptom of constipation and described as her stool as being hard as a rock. Modifying factors include having to disimpact herself and Miralax. Denies abdominal pain, nausea, vomiting or abdominal pain. Endorses some lower extremity swelling.   No Known Allergies   Current Outpatient Prescriptions on File Prior to Visit  Medication Sig Dispense Refill  . atorvastatin (LIPITOR) 40 MG tablet Take 40 mg by mouth daily.    . buprenorphine-naloxone (SUBOXONE) 2-0.5 MG SUBL SL tablet Place 1 tablet under the tongue daily.    . cyclobenzaprine (FLEXERIL) 5 MG tablet TAKE 1 TABLET BY MOUTH THREE TIMES A DAY AS NEEDED FOR MUSCLE SPASM 90 tablet 0  . divalproex (DEPAKOTE ER) 500 MG 24 hr tablet Take 500 mg by mouth 2 (two) times daily.    Marland Kitchen levothyroxine (SYNTHROID, LEVOTHROID) 137 MCG tablet TAKE 1 TABLET BY MOUTH DAILY BEFORE BREAKFAST 30 tablet 0  . loxapine (LOXITANE) 10 MG capsule Take 10 mg by mouth at bedtime.    . metoprolol (LOPRESSOR) 50 MG tablet Take 50 mg by mouth daily.    . naproxen sodium (ANAPROX) 220 MG tablet Take 440 mg by mouth 4 (four) times daily as needed (pain).    Marland Kitchen OLANZapine (ZYPREXA) 10 MG tablet Take 10 mg by mouth daily.    . polyethylene glycol powder (GLYCOLAX/MIRALAX) powder Take 17 g by mouth daily. 225 g 1  . QUEtiapine (SEROQUEL) 100 MG tablet Take 100 mg by mouth daily.     No current facility-administered medications on file prior to visit.      Past Surgical History  Procedure Laterality Date  . Abdominal hysterectomy    . Total abdominal hysterectomy w/ bilateral salpingoophorectomy    . Tonsillectomy    . Lumbar laminectomy/decompression microdiscectomy  07/17/2011    Procedure: LUMBAR LAMINECTOMY/DECOMPRESSION MICRODISCECTOMY;  Surgeon: Sinclair Ship, MD;  Location: [facility];  Service: Orthopedics;  Laterality: Left;  Left sided lumbar 4-5 microdisectomy      Review of Systems  Constitutional: Negative for fever and chills.  Respiratory: Negative for cough, chest tightness, shortness of breath and wheezing.   Cardiovascular: Positive for leg swelling. Negative for chest pain and palpitations.  Gastrointestinal: Positive for constipation. Negative for nausea, vomiting, abdominal pain, diarrhea and rectal pain.      Objective:    BP 102/68 mmHg  Pulse 98  Temp(Src) 97.9 F (36.6 C) (Oral)  Ht 5\' 3"  (1.6 m)  Wt 211 lb (95.709 kg)  BMI 37.39 kg/m2  SpO2 92% Nursing note and vital signs reviewed.  Physical Exam  Constitutional: She is oriented to person, place, and time. She appears well-developed and well-nourished. No distress.  Cardiovascular: Normal rate, regular rhythm, normal heart sounds and intact distal pulses.   Pulmonary/Chest: Effort normal and breath sounds normal.  Abdominal: Normal appearance and bowel sounds are normal. There is no hepatosplenomegaly. There is no tenderness. There is no rigidity, no guarding, no  tenderness at McBurney's point and negative Murphy's sign.  Neurological: She is alert and oriented to person, place, and time.  Skin: Skin is warm and dry.  Psychiatric: She has a normal mood and affect. Her behavior is normal. Judgment and thought content normal.       Assessment & Plan:   Problem List Items Addressed This Visit      Digestive   Constipation - Primary    Constipation has been refractory to MiraLAX and over-the-counter medications. Abdominal exam is benign  today. Recommend continue Colace for stool softener. Start Linzess. Sample of medication provided. Encouraged to increase fiber and water intake as well as physical activity as able. Follow up if symptoms worsen or do not improve with medication.       Relevant Medications   linaclotide (LINZESS) 145 MCG CAPS capsule     Other   Bilateral lower extremity edema    Continues to experience lower extremity edema. Encouraged to elevate legs, decrease sodium in diet and wear compression sox. Increase furosemide to 80 mg x 3 days and then return to 40 mg daily. Follow up if symptoms worsen or do not improve.       Hypoxia, sleep related    Most recent sleep study with hypoxia with no evidence of sleep apnea but significant sleep hypoxia. Recommendation for weight loss and decreased sedating mediations. Will consult with pulmonology for recommendations as patient indicates that Ambien is the only that helps her to sleep and the cyclobenzaprine is needed because of her muscle spasms. Continue current medication regimen pending review.           I have discontinued Ms. Provencal's diphenhydramine-acetaminophen. I have also changed her zolpidem. Additionally, I am having her start on linaclotide. Lastly, I am having her maintain her divalproex, OLANZapine, loxapine, cyclobenzaprine, atorvastatin, metoprolol, buprenorphine-naloxone, QUEtiapine, naproxen sodium, polyethylene glycol powder, levothyroxine, and furosemide.   Meds ordered this encounter  Medications  . linaclotide (LINZESS) 145 MCG CAPS capsule    Sig: Take 1 capsule (145 mcg total) by mouth daily before breakfast.    Dispense:  30 capsule    Refill:  0    Order Specific Question:  Supervising Provider    Answer:  Pricilla Holm A [id number]  . zolpidem (AMBIEN) 5 MG tablet    Sig: Take 1 tablet (5 mg total) by mouth at bedtime as needed. for sleep    Dispense:  30 tablet    Refill:  0    Order Specific Question:  Supervising  Provider    Answer:  Pricilla Holm A [id number]  . furosemide (LASIX) 40 MG tablet    Sig: TAKE 1 TABLET (40 MG TOTAL) BY MOUTH DAILY.    Dispense:  30 tablet    Refill:  0    Order Specific Question:  Supervising Provider    Answer:  Pricilla Holm A [id number]     Follow-up: Return in about 1 month (around 10/08/2015), or if symptoms worsen or fail to improve.  Mauricio Po, FNP

## 2015-09-07 NOTE — Assessment & Plan Note (Signed)
Continues to experience lower extremity edema. Encouraged to elevate legs, decrease sodium in diet and wear compression sox. Increase furosemide to 80 mg x 3 days and then return to 40 mg daily. Follow up if symptoms worsen or do not improve.

## 2015-09-07 NOTE — Progress Notes (Signed)
Pre visit review using our clinic review tool, if applicable. No additional management support is needed unless otherwise documented below in the visit note. 

## 2015-09-07 NOTE — Assessment & Plan Note (Signed)
Most recent sleep study with hypoxia with no evidence of sleep apnea but significant sleep hypoxia. Recommendation for weight loss and decreased sedating mediations. Will consult with pulmonology for recommendations as patient indicates that Ambien is the only that helps her to sleep and the cyclobenzaprine is needed because of her muscle spasms. Continue current medication regimen pending review.

## 2015-09-07 NOTE — Assessment & Plan Note (Signed)
Constipation has been refractory to MiraLAX and over-the-counter medications. Abdominal exam is benign today. Recommend continue Colace for stool softener. Start Linzess. Sample of medication provided. Encouraged to increase fiber and water intake as well as physical activity as able. Follow up if symptoms worsen or do not improve with medication.

## 2015-09-07 NOTE — Patient Instructions (Addendum)
Thank you for choosing [facility].  Summary/Instructions:  Start trial of Linzess.   Continue current medications as prescribed.  Lasix 80 mg for 3 days then return back to 40 mg daily.  Your prescription(s) have been submitted to your pharmacy or been printed and provided for you. Please take as directed and contact our office if you believe you are having problem(s) with the medication(s) or have any questions.  Please stop by the lab on the lower level of the building for your blood work. Your results will be released to [place] (or called to you) after review, usually within 72 hours after test completion. If any changes need to be made, you will be notified at that same time.  1. The lab is open from 7:30am to 5:30 pm Monday-Friday  2. No appointment is necessary  3. Fasting (if needed) is 6-8 hours after food and drink; black coffee  and water are okay   If your symptoms worsen or fail to improve, please contact our office for further instruction, or in case of emergency go directly to the emergency room at the closest medical facility.

## 2015-09-07 NOTE — Telephone Encounter (Signed)
Patient called and said that she forgot to tell Marya Amsler about a rash/breaking down area under her breast. She would like to know if he can send her something in for it. Please advise or follow up, Thank you.

## 2015-09-10 NOTE — Telephone Encounter (Signed)
Please have her follow up. In the meantime she can try an OTC antifungal or [place] powder.

## 2015-09-11 ENCOUNTER — Other Ambulatory Visit: Payer: Self-pay | Admitting: Family

## 2015-09-11 NOTE — Telephone Encounter (Signed)
Pt states that she has been using secret deodorant under her breast which have helped a lot. Will call back for a follow up if rash comes back.

## 2015-09-26 ENCOUNTER — Other Ambulatory Visit: Payer: Self-pay | Admitting: Family

## 2015-09-26 DIAGNOSIS — K5900 Constipation, unspecified: Secondary | ICD-10-CM

## 2015-10-30 ENCOUNTER — Other Ambulatory Visit: Payer: Self-pay | Admitting: Family

## 2015-10-30 DIAGNOSIS — K5900 Constipation, unspecified: Secondary | ICD-10-CM

## 2015-10-31 ENCOUNTER — Telehealth: Payer: Self-pay | Admitting: Family

## 2015-10-31 NOTE — Telephone Encounter (Signed)
Patient Name: Kelsey Hanna  DOB: 04/25/1950    Initial Comment Caller states she's having trouble with her bowels.    Nurse Assessment  Nurse: Raphael Gibney, RN, Vanita Ingles Date/Time (Eastern Time): 10/31/2015 10:23:30 AM  Confirm and document reason for call. If symptomatic, describe symptoms. You must click the next button to save text entered. ---Caller states she has been taking miralax for her constipation. She is impacted. Has tried to dig out her impaction. Stool was hard and large when her bowels moved. She thinks she has ruptured her anal sphincter. Stool was the size of her hand. Her bowels moved 3 days ago. Wants to know about using colace.  Has the patient traveled out of the country within the last 30 days? ---Not Applicable  Does the patient have any new or worsening symptoms? ---Yes  Will a triage be completed? ---Yes  Related visit to physician within the last 2 weeks? ---No  Does the PT have any chronic conditions? (i.e. diabetes, asthma, etc.) ---Yes  List chronic conditions. ---chronic constipation; borderline diabetes  Is this a behavioral health or substance abuse call? ---No     Guidelines    Guideline Title Affirmed Question Affirmed Notes  Constipation Unable to have a bowel movement (BM) without manually removing stool (using finger to pull out stool or perform disimpaction)    Final Disposition User   See PCP When Office is Open (within 3 days) Raphael Gibney, Therapist, sports, Vanita Ingles    Comments  pt wants to know to know if there is a stool softener she can use as she is already taking miralax. Has appt at pain clinic today and does not want appt until next week. Please call pt back regarding stool softener and let he know if you are sending it to the pharmacy.  appt at the pain clinic is at 1:15 pm today.   Referrals  REFERRED TO PCP OFFICE   Disagree/Comply: Disagree  Disagree/Comply Reason: Disagree with instructions

## 2015-10-31 NOTE — Telephone Encounter (Signed)
The only stool softener would be docusate sodium 100-200 mg daily as needed.

## 2015-11-01 MED ORDER — DOCUSATE SODIUM 100 MG PO CAPS: 100.0000 mg | ORAL_CAPSULE | Freq: Two times a day (BID) | ORAL | 11 refills | Status: DC | PRN

## 2015-11-01 NOTE — Telephone Encounter (Signed)
Notified pt w/Greg response. Sent rx to [facility]...Johny Chess

## 2015-11-01 NOTE — Addendum Note (Signed)
Addended by: Earnstine Regal on: 11/01/2015 09:15 AM   Modules accepted: Orders

## 2015-11-21 ENCOUNTER — Other Ambulatory Visit: Payer: Self-pay | Admitting: Internal Medicine

## 2015-11-21 ENCOUNTER — Other Ambulatory Visit: Payer: Self-pay | Admitting: *Deleted

## 2015-11-21 MED ORDER — ZOLPIDEM TARTRATE 5 MG PO TABS: 5.0000 mg | ORAL_TABLET | Freq: Every evening | ORAL | 0 refills | Status: DC | PRN

## 2015-11-21 NOTE — Telephone Encounter (Signed)
RX written 

## 2015-11-21 NOTE — Telephone Encounter (Signed)
Pt left msg on triage requesting refill on ambien. Marya Amsler is out of office pls advise...Johny Chess

## 2015-11-21 NOTE — Telephone Encounter (Signed)
Called pt to confirm pharmacy [facility]).   Rx has been faxed.

## 2015-11-29 ENCOUNTER — Other Ambulatory Visit: Payer: Self-pay | Admitting: Family

## 2015-12-03 ENCOUNTER — Telehealth: Payer: Self-pay | Admitting: Emergency Medicine

## 2015-12-03 NOTE — Telephone Encounter (Signed)
Pt called and stated the Ambien makes her sleep walk. She wants to know if she can get Lunesta instead. Pharmacy is [facility].  Please advise thanks.

## 2015-12-05 NOTE — Telephone Encounter (Signed)
Needs office visit to discuss.

## 2015-12-05 NOTE — Telephone Encounter (Signed)
Called pt to schedule OV. She stated she would call back to make appt. Thanks.

## 2015-12-17 ENCOUNTER — Other Ambulatory Visit: Payer: Self-pay | Admitting: Internal Medicine

## 2015-12-18 ENCOUNTER — Other Ambulatory Visit: Payer: Self-pay | Admitting: Family

## 2015-12-18 NOTE — Telephone Encounter (Signed)
Last refill was 11/21/15

## 2016-01-14 ENCOUNTER — Ambulatory Visit: Payer: Self-pay | Admitting: Family

## 2016-01-17 ENCOUNTER — Ambulatory Visit (INDEPENDENT_AMBULATORY_CARE_PROVIDER_SITE_OTHER): Payer: Medicare Other | Admitting: Family

## 2016-01-17 ENCOUNTER — Encounter: Payer: Self-pay | Admitting: Family

## 2016-01-17 VITALS — BP 114/70 | HR 91 | Temp 98.4°F | Resp 16 | Ht 63.0 in | Wt 194.0 lb

## 2016-01-17 DIAGNOSIS — Z23 Encounter for immunization: Secondary | ICD-10-CM

## 2016-01-17 DIAGNOSIS — F5101 Primary insomnia: Secondary | ICD-10-CM

## 2016-01-17 MED ORDER — ESZOPICLONE 1 MG PO TABS: 1.0000 mg | ORAL_TABLET | Freq: Every evening | ORAL | 0 refills | Status: DC | PRN

## 2016-01-17 NOTE — Progress Notes (Signed)
Subjective:    Patient ID: Kelsey Hanna, female    DOB: 08-May-1950, 65 y.o.   MRN: [id number]  Chief Complaint  Patient presents with  . Follow-up    wants an rx for lunesta that she states she has taken in the past but is not currently on     HPI:  Kelsey Hanna is a 65 y.o. female who  has a past medical history of Bipolar 1 disorder ([place]); Cancer ([place]); Chicken pox; Depression; Diabetes mellitus; Dyslipidemia; Hypothyroid; Retinal detachment; and Sleep apnea. and presents today for a follow up office visit.   1.) Sleep disturbance - Previously maintained on Ambien. She has been out of this medication for a period of time. Reports taking the medication when she had it and worked ok. Currently sleeping about 3-4 hours per night. Requesting to change to [facility].   No Known Allergies    Outpatient Medications Prior to Visit  Medication Sig Dispense Refill  . atorvastatin (LIPITOR) 40 MG tablet TAKE 1 TABLET BY MOUTH IN THE EVENING 30 tablet 2  . buprenorphine-naloxone (SUBOXONE) 2-0.5 MG SUBL SL tablet Place 1 tablet under the tongue daily.    . cyclobenzaprine (FLEXERIL) 5 MG tablet TAKE 1 TABLET BY MOUTH THREE TIMES A DAY AS NEEDED FOR MUSCLE SPASM 90 tablet 0  . docusate sodium (COLACE) 100 MG capsule Take 1-2 capsules (100-200 mg total) by mouth 2 (two) times daily as needed for mild constipation. 60 capsule 11  . furosemide (LASIX) 40 MG tablet TAKE 1 TABLET (40 MG TOTAL) BY MOUTH DAILY. 30 tablet 0  . levothyroxine (SYNTHROID, LEVOTHROID) 137 MCG tablet TAKE 1 TABLET BY MOUTH DAILY BEFORE BREAKFAST 30 tablet 5  . LINZESS 145 MCG CAPS capsule TAKE 1 CAPSULE (145 MCG TOTAL) BY MOUTH DAILY BEFORE BREAKFAST. 30 capsule 5  . metoprolol (LOPRESSOR) 50 MG tablet Take 50 mg by mouth daily.    . naproxen sodium (ANAPROX) 220 MG tablet Take 440 mg by mouth 4 (four) times daily as needed (pain).    Marland Kitchen OLANZapine (ZYPREXA) 10 MG tablet Take 10 mg by mouth daily.    . polyethylene  glycol powder (GLYCOLAX/MIRALAX) powder MIX 17G AS DIRECTED AND DRINK ONCE DAILY 255 g 2  . QUEtiapine (SEROQUEL) 100 MG tablet Take 100 mg by mouth daily.    . traZODone (DESYREL) 150 MG tablet TAKE 1 TABLET BY MOUTH AT BEDTIME 30 tablet 2  . zolpidem (AMBIEN) 5 MG tablet Take 1 tablet (5 mg total) by mouth at bedtime as needed. for sleep 30 tablet 0  . loxapine (LOXITANE) 10 MG capsule Take 10 mg by mouth at bedtime.     No facility-administered medications prior to visit.     Review of Systems  Constitutional: Negative for chills and fever.  Neurological: Negative for weakness and numbness.  Psychiatric/Behavioral: Positive for sleep disturbance. Negative for behavioral problems, decreased concentration and hallucinations. The patient is not nervous/anxious.       Objective:    BP 114/70 (BP Location: Left Arm, Patient Position: Sitting, Cuff Size: Large)   Pulse 91   Temp 98.4 F (36.9 C) (Oral)   Resp 16   Ht 5\' 3"  (1.6 m)   Wt 194 lb (88 kg)   SpO2 92%   BMI 34.37 kg/m  Nursing note and vital signs reviewed.  Physical Exam  Constitutional: She is oriented to person, place, and time. She appears well-developed and well-nourished. No distress.  Cardiovascular: Normal rate, regular rhythm, normal heart  sounds and intact distal pulses.   Pulmonary/Chest: Effort normal and breath sounds normal.  Neurological: She is alert and oriented to person, place, and time.  Skin: Skin is warm and dry.  Psychiatric: She has a normal mood and affect. Her behavior is normal. Judgment and thought content normal.       Assessment & Plan:   Problem List Items Addressed This Visit      Other   Insomnia - Primary    Continues to have difficulty with sleeping improved with Ambien although would like to try Lunesta. Sleeping 3-4 hours per night without medication. Discontinue Ambien and start Lunesta. Encouraged good sleep hygiene. Continue to monitor.       Other Visit Diagnoses     Encounter for immunization       Relevant Orders   Flu vaccine HIGH DOSE PF (Completed)      I have discontinued Ms. Fronczak's QUEtiapine, traZODone, and zolpidem. I am also having her maintain her OLANZapine, loxapine, cyclobenzaprine, metoprolol, buprenorphine-naloxone, naproxen sodium, atorvastatin, LINZESS, levothyroxine, polyethylene glycol powder, docusate sodium, furosemide, and eszopiclone.   Meds ordered this encounter  Medications  . DISCONTD: eszopiclone (LUNESTA) 1 MG TABS tablet    Sig: Take 1 tablet (1 mg total) by mouth at bedtime as needed for sleep. Take immediately before bedtime    Dispense:  30 tablet    Refill:  0    Order Specific Question:   Supervising Provider    Answer:   Pricilla Holm A [id number]  . eszopiclone (LUNESTA) 1 MG TABS tablet    Sig: Take 1 tablet (1 mg total) by mouth at bedtime as needed for sleep. Take immediately before bedtime    Dispense:  30 tablet    Refill:  0    Supervising Provider: Dr. Pricilla Holm [id number]    Follow-up: Return if symptoms worsen or fail to improve.  Mauricio Po, FNP

## 2016-01-17 NOTE — Assessment & Plan Note (Signed)
Continues to have difficulty with sleeping improved with Ambien although would like to try Lunesta. Sleeping 3-4 hours per night without medication. Discontinue Ambien and start Lunesta. Encouraged good sleep hygiene. Continue to monitor.

## 2016-01-17 NOTE — Patient Instructions (Addendum)
Thank you for choosing [facility].  SUMMARY AND INSTRUCTIONS:  Medication:  Please start taking the Lunesta as prescribed.   Your prescription(s) have been submitted to your pharmacy or been printed and provided for you. Please take as directed and contact our office if you believe you are having problem(s) with the medication(s) or have any questions.  Follow up:  If your symptoms worsen or fail to improve, please contact our office for further instruction, or in case of emergency go directly to the emergency room at the closest medical facility.   Recommendations for improving sleep:   Avoid having pets sleep in the bedroom  Avoid caffeine consumption after 4pm  Keep bedroom cool and conducive to sleep  Avoid nicotine use, especially in the evening  Avoid exercise within 2-3 hours before bedtime  Stimulus Control:   Go to bed only when sleepy  Use the bedroom for sleep and sex only  Go to another room if you are unable to fall asleep within 15 to 20 minutes  Read or engage in other quiet activities and return to bed only when sleepy.

## 2016-01-21 ENCOUNTER — Encounter: Payer: Self-pay | Admitting: Family

## 2016-01-28 ENCOUNTER — Other Ambulatory Visit: Payer: Self-pay | Admitting: Family

## 2016-02-07 ENCOUNTER — Encounter: Payer: Self-pay | Admitting: Family

## 2016-02-07 ENCOUNTER — Ambulatory Visit (INDEPENDENT_AMBULATORY_CARE_PROVIDER_SITE_OTHER): Payer: Medicare Other | Admitting: Family

## 2016-02-07 DIAGNOSIS — F5101 Primary insomnia: Secondary | ICD-10-CM

## 2016-02-07 DIAGNOSIS — Z79899 Other long term (current) drug therapy: Secondary | ICD-10-CM | POA: Diagnosis not present

## 2016-02-07 NOTE — Assessment & Plan Note (Signed)
Continues to experience insomnia and is insistent that she is only taking half a pill per evening that does not help with her sleep. Will check with pharmacy regarding medication dispensed. Encouraged good sleep hygiene. Previous sleep study with concern for over sedation resulting in decreased oxygen. She is since no longer taking several medications. Continue current dosage of lunesta. Follow up pending medication reconciliation.

## 2016-02-07 NOTE — Assessment & Plan Note (Signed)
Patient is unsure of the medications that she is supposed to be taking. Will contact pharmacy to determine current medication list as she has been seen by other providers including psychiatry and pain management.

## 2016-02-07 NOTE — Progress Notes (Signed)
Subjective:    Patient ID: Kelsey Hanna, female    DOB: 05-Oct-1950, 65 y.o.   MRN: [id number]  Chief Complaint  Patient presents with  . Follow-up    had a sleep apnea test done and was told she is not getting enough oxygen at night which could be causing the memory issues, wants to increase the lunesta    HPI:  Kelsey Hanna is a 65 y.o. female who  has a past medical history of Bipolar 1 disorder ([place]); Cancer ([place]); Chicken pox; Depression; Diabetes mellitus; Dyslipidemia; Hypothyroid; Retinal detachment; and Sleep apnea. and presents today for an office visit.  1.) Primary Insomnia - Currently maintained on Lunesta. Reports that she is only taking 1/2 pill nightly as that was prescribed per her nurse. Notes the medication is not very effective in helping her to sleep. Patient is unsure of the medications that she is taking. Indicates that she continues to work on improving her sleep hygiene.   No Known Allergies    Outpatient Medications Prior to Visit  Medication Sig Dispense Refill  . atorvastatin (LIPITOR) 40 MG tablet TAKE 1 TABLET BY MOUTH IN THE EVENING 30 tablet 0  . cyclobenzaprine (FLEXERIL) 5 MG tablet TAKE 1 TABLET BY MOUTH THREE TIMES A DAY AS NEEDED FOR MUSCLE SPASM 90 tablet 0  . docusate sodium (COLACE) 100 MG capsule Take 1-2 capsules (100-200 mg total) by mouth 2 (two) times daily as needed for mild constipation. 60 capsule 11  . eszopiclone (LUNESTA) 1 MG TABS tablet Take 1 tablet (1 mg total) by mouth at bedtime as needed for sleep. Take immediately before bedtime 30 tablet 0  . furosemide (LASIX) 40 MG tablet TAKE 1 TABLET (40 MG TOTAL) BY MOUTH DAILY. 30 tablet 0  . levothyroxine (SYNTHROID, LEVOTHROID) 137 MCG tablet TAKE 1 TABLET BY MOUTH DAILY BEFORE BREAKFAST 30 tablet 5  . LINZESS 145 MCG CAPS capsule TAKE 1 CAPSULE (145 MCG TOTAL) BY MOUTH DAILY BEFORE BREAKFAST. 30 capsule 5  . metoprolol (LOPRESSOR) 50 MG tablet Take 50 mg by mouth daily.    .  naproxen sodium (ANAPROX) 220 MG tablet Take 440 mg by mouth 4 (four) times daily as needed (pain).    Marland Kitchen OLANZapine (ZYPREXA) 10 MG tablet Take 10 mg by mouth daily.    . polyethylene glycol powder (GLYCOLAX/MIRALAX) powder MIX 17G AS DIRECTED AND DRINK ONCE DAILY 255 g 2  . buprenorphine-naloxone (SUBOXONE) 2-0.5 MG SUBL SL tablet Place 1 tablet under the tongue daily.    Marland Kitchen loxapine (LOXITANE) 10 MG capsule Take 10 mg by mouth at bedtime.     No facility-administered medications prior to visit.      Review of Systems  Constitutional: Negative for chills and fever.  Psychiatric/Behavioral: Positive for sleep disturbance. Negative for agitation, confusion, decreased concentration, dysphoric mood, hallucinations, self-injury and suicidal ideas.      Objective:    BP 118/80 (BP Location: Left Arm, Patient Position: Sitting, Cuff Size: Large)   Pulse 91   Temp 98.1 F (36.7 C) (Oral)   Resp 16   Ht 5\' 3"  (1.6 m)   Wt 187 lb 1.9 oz (84.9 kg)   SpO2 95%   BMI 33.15 kg/m  Nursing note and vital signs reviewed.  Physical Exam  Constitutional: She is oriented to person, place, and time. She appears well-developed and well-nourished. No distress.  Cardiovascular: Normal rate, regular rhythm, normal heart sounds and intact distal pulses.   Pulmonary/Chest: Effort normal and breath  sounds normal.  Neurological: She is alert and oriented to person, place, and time.  Skin: Skin is warm and dry.  Psychiatric: She has a normal mood and affect. Her behavior is normal. Judgment and thought content normal.       Assessment & Plan:   Problem List Items Addressed This Visit      Other   Insomnia    Continues to experience insomnia and is insistent that she is only taking half a pill per evening that does not help with her sleep. Will check with pharmacy regarding medication dispensed. Encouraged good sleep hygiene. Previous sleep study with concern for over sedation resulting in decreased  oxygen. She is since no longer taking several medications. Continue current dosage of lunesta. Follow up pending medication reconciliation.       Encounter for medication review    Patient is unsure of the medications that she is supposed to be taking. Will contact pharmacy to determine current medication list as she has been seen by other providers including psychiatry and pain management.          I have discontinued Ms. Rister's buprenorphine-naloxone. I am also having her maintain her OLANZapine, loxapine, cyclobenzaprine, metoprolol, naproxen sodium, LINZESS, levothyroxine, polyethylene glycol powder, docusate sodium, eszopiclone, atorvastatin, and furosemide.   Follow-up: Return if symptoms worsen or fail to improve.  Mauricio Po, FNP

## 2016-02-07 NOTE — Patient Instructions (Addendum)
Thank you for choosing [facility].  SUMMARY AND INSTRUCTIONS:  We will need to check on your medication list to determine what you are taking and not taking.  Medication:  Continue to take your medication as prescribed.   Your prescription(s) have been submitted to your pharmacy or been printed and provided for you. Please take as directed and contact our office if you believe you are having problem(s) with the medication(s) or have any questions.  Follow up:  If your symptoms worsen or fail to improve, please contact our office for further instruction, or in case of emergency go directly to the emergency room at the closest medical facility.

## 2016-02-13 ENCOUNTER — Telehealth: Payer: Self-pay | Admitting: Family

## 2016-02-13 NOTE — Telephone Encounter (Signed)
Pt called request to speak to the assistant concern about Lunesta dosage. Please call her back

## 2016-02-15 NOTE — Telephone Encounter (Signed)
Pt called to check up on this request.  °

## 2016-02-15 NOTE — Telephone Encounter (Signed)
Returned pts call. Working on [place] for medication approval.

## 2016-02-15 NOTE — Telephone Encounter (Signed)
PA done for [facility] waiting on response.

## 2016-02-19 NOTE — Telephone Encounter (Signed)
Pt called in to check on this PA

## 2016-02-19 NOTE — Telephone Encounter (Signed)
PA for Kelsey Hanna has been approved from 01/29/16-02/16/17. Called pharmacy and pt to let them know.

## 2016-03-12 ENCOUNTER — Telehealth: Payer: Self-pay | Admitting: Emergency Medicine

## 2016-03-12 DIAGNOSIS — M545 Low back pain, unspecified: Secondary | ICD-10-CM

## 2016-03-12 DIAGNOSIS — G8929 Other chronic pain: Secondary | ICD-10-CM

## 2016-03-12 NOTE — Telephone Encounter (Signed)
Pt called and would like you to put a referral in to see pain management. [phone] is the phone number she asked me to leave for you. Please advise thanks.

## 2016-03-13 NOTE — Telephone Encounter (Signed)
States that she stopped going to [facility] due to the long wait about 6 weeks ago. Wants to be referred to another pain management. Referral has been placed.

## 2016-03-14 ENCOUNTER — Other Ambulatory Visit: Payer: Self-pay | Admitting: Family

## 2016-03-18 ENCOUNTER — Other Ambulatory Visit: Payer: Self-pay | Admitting: Family

## 2016-03-19 NOTE — Telephone Encounter (Signed)
Faxed script back to [facility]...Kelsey Hanna

## 2016-03-25 ENCOUNTER — Other Ambulatory Visit: Payer: Self-pay | Admitting: Family

## 2016-03-25 DIAGNOSIS — K5900 Constipation, unspecified: Secondary | ICD-10-CM

## 2016-03-27 ENCOUNTER — Ambulatory Visit (INDEPENDENT_AMBULATORY_CARE_PROVIDER_SITE_OTHER): Payer: Medicare Other | Admitting: Family

## 2016-03-27 ENCOUNTER — Ambulatory Visit: Payer: Self-pay | Admitting: Family

## 2016-03-27 VITALS — BP 122/78 | HR 97 | Temp 98.0°F | Resp 16 | Ht 61.0 in | Wt 186.0 lb

## 2016-03-27 DIAGNOSIS — M5442 Lumbago with sciatica, left side: Secondary | ICD-10-CM

## 2016-03-27 DIAGNOSIS — M25511 Pain in right shoulder: Secondary | ICD-10-CM

## 2016-03-27 DIAGNOSIS — G8929 Other chronic pain: Secondary | ICD-10-CM | POA: Diagnosis not present

## 2016-03-27 DIAGNOSIS — F5101 Primary insomnia: Secondary | ICD-10-CM

## 2016-03-27 MED ORDER — ESZOPICLONE 2 MG PO TABS: 2.0000 mg | ORAL_TABLET | Freq: Every evening | ORAL | 0 refills | Status: DC | PRN

## 2016-03-27 MED ORDER — CYCLOBENZAPRINE HCL 5 MG PO TABS: ORAL_TABLET | ORAL | 0 refills | Status: DC

## 2016-03-27 NOTE — Patient Instructions (Addendum)
Thank you for choosing [facility].  SUMMARY AND INSTRUCTIONS:  Ice / moist heat to the lower back and ankle every 2 hours as needed and after activity.  Continue the flexeril as needed for muscle spasms.   They will call to schedule your appointment with pain management.   Work on obtaining records from Dr. Dillard Cannon Office  Increase Lunesta to 2 mg nightly.   Medication:  Your prescription(s) have been submitted to your pharmacy or been printed and provided for you. Please take as directed and contact our office if you believe you are having problem(s) with the medication(s) or have any questions.  Referrals:  Referrals have been made during this visit. You should expect to hear back from our schedulers in about 7-10 days in regards to establishing an appointment with the specialists we discussed.   Follow up:  If your symptoms worsen or fail to improve, please contact our office for further instruction, or in case of emergency go directly to the emergency room at the closest medical facility.

## 2016-03-27 NOTE — Assessment & Plan Note (Signed)
Symptoms and exam consistent with chronic low back pain status post surgical repair with no significant improvements with conservative treatment and previously managed by pain management.  Recommend continued treatment with ice/moist heat, home exercise therapy, and continue current dosage of Flexeril. Referral to pain management place per patient request.Continue to monitor.

## 2016-03-27 NOTE — Progress Notes (Signed)
Subjective:    Patient ID: Kelsey Hanna, female    DOB: Nov 17, 1950, 67 y.o.   MRN: [id number]  Chief Complaint  Patient presents with  . Pain management    needs to have all pain areas looked at for a pain management referral    HPI:  Kelsey Hanna is a 66 y.o. female who  has a past medical history of Bipolar 1 disorder ([place]); Cancer ([place]); Chicken pox; Depression; Diabetes mellitus; Dyslipidemia; Hypothyroid; Retinal detachment; and Sleep apnea. and presents today for an office visit.   1.) Chronic pain - Continues to experience the associated symptom of chronic pain located in lower back/coccyx, right shoulder and left ankle have been going on for several years. Has had previous history of back surgery and has declined surgery on the left ankle secondary to not wishing to be in a skilled nursing facility. Currently maintained on Flexeril which she report taking as prescribed. She does express concern that she is not receiving her medications as prescribed.  2.) Sleep - Continues to experience the associated symptoms of insomnia and currently maintained on 1 mg of Lunesta which she reports is not working and she is going to bed at 4am. Currently sleeping about 3-4 hours per night on average.    No Known Allergies    Outpatient Medications Prior to Visit  Medication Sig Dispense Refill  . atorvastatin (LIPITOR) 40 MG tablet TAKE 1 TABLET BY MOUTH IN THE EVENING 30 tablet 0  . docusate sodium (COLACE) 100 MG capsule Take 1-2 capsules (100-200 mg total) by mouth 2 (two) times daily as needed for mild constipation. (Patient taking differently: Take 200 mg by mouth 2 (two) times daily as needed for mild constipation. Take 2 tablets daily at bedtime) 60 capsule 11  . furosemide (LASIX) 40 MG tablet TAKE 1 TABLET (40 MG TOTAL) BY MOUTH DAILY. 30 tablet 0  . levothyroxine (SYNTHROID, LEVOTHROID) 137 MCG tablet TAKE 1 TABLET BY MOUTH DAILY BEFORE BREAKFAST 30 tablet 5  . LINZESS 145  MCG CAPS capsule TAKE 1 CAPSULE (145 MCG TOTAL) BY MOUTH DAILY BEFORE BREAKFAST. 90 capsule 0  . metoprolol (LOPRESSOR) 50 MG tablet Take 50 mg by mouth daily.    . naproxen sodium (ANAPROX) 220 MG tablet Take 440 mg by mouth 4 (four) times daily as needed (pain).    Marland Kitchen OLANZapine (ZYPREXA) 10 MG tablet Take 10 mg by mouth daily.    . polyethylene glycol powder (GLYCOLAX/MIRALAX) powder MIX 17G AS DIRECTED AND DRINK ONCE DAILY 255 g 2  . cyclobenzaprine (FLEXERIL) 5 MG tablet TAKE 1 TABLET BY MOUTH THREE TIMES A DAY AS NEEDED FOR MUSCLE SPASM 90 tablet 0  . eszopiclone (LUNESTA) 1 MG TABS tablet TAKE 1 TABLET BY MOUTH AT BEDTIME 30 tablet 0  . loxapine (LOXITANE) 10 MG capsule Take 10 mg by mouth at bedtime.     No facility-administered medications prior to visit.       Past Surgical History:  Procedure Laterality Date  . ABDOMINAL HYSTERECTOMY    . LUMBAR LAMINECTOMY/DECOMPRESSION MICRODISCECTOMY  07/17/2011   Procedure: LUMBAR LAMINECTOMY/DECOMPRESSION MICRODISCECTOMY;  Surgeon: Sinclair Ship, MD;  Location: [facility];  Service: Orthopedics;  Laterality: Left;  Left sided lumbar 4-5 microdisectomy  . TONSILLECTOMY    . TOTAL ABDOMINAL HYSTERECTOMY W/ BILATERAL SALPINGOOPHORECTOMY        Past Medical History:  Diagnosis Date  . Bipolar 1 disorder ([place])   . Cancer [facility])    cancer of uterus...no chemo  or radiation  . Chicken pox   . Depression   . Diabetes mellitus    dx within last yr....just takes pills  . Dyslipidemia   . Hypothyroid   . Retinal detachment   . Sleep apnea       Review of Systems  Constitutional: Negative for chills and fever.  Respiratory: Negative for chest tightness and shortness of breath.   Cardiovascular: Negative for chest pain, palpitations and leg swelling.  Musculoskeletal: Positive for back pain.       Positive for left ankle pain  Neurological: Negative for weakness and numbness.  Psychiatric/Behavioral: Positive for sleep disturbance.        Objective:    BP 122/78 (BP Location: Left Arm, Patient Position: Sitting, Cuff Size: Large)   Pulse 97   Temp 98 F (36.7 C) (Oral)   Resp 16   Ht 5\' 1"  (1.549 m)   Wt 186 lb (84.4 kg)   SpO2 93%   BMI 35.14 kg/m  Nursing note and vital signs reviewed.  Physical Exam  Constitutional: She is oriented to person, place, and time. She appears well-developed and well-nourished. No distress.  Cardiovascular: Normal rate, regular rhythm, normal heart sounds and intact distal pulses.   Pulmonary/Chest: Effort normal and breath sounds normal.  Musculoskeletal:  Low back - no obvious deformity, discoloration, or edema. There is palpable tenderness of the upper one third of the thoracic spine and lower lumbar and coccyx area with no obvious deformities or crepitus. There is some minor radiculopathy to the right lower extremity. Range of motion is within normal limits with discomfort noted in extension. Pulses are intact and appropriate. Negative straight leg raise or Faber's.  Right shoulder  - no obvious deformity, discoloration, or edema. Tenderness elicited over thoracic paraspinal musculature along the vertebral border of the scapula. Range of motion appears adequate. Strength is 4+. Distal pulses and sensation are intact and appropriate.   Neurological: She is alert and oriented to person, place, and time.  Skin: Skin is warm and dry.  Psychiatric: She has a normal mood and affect. Her behavior is normal. Judgment and thought content normal.       Assessment & Plan:   Problem List Items Addressed This Visit      Other   Right shoulder pain     Right shoulder pain with pain most of the in the posterior aspect of vertebral border of the scapula and no significant tenderness of rotator cuff. She conservatively with ice/moist heat and home exercis therapy. Referral to pain management place per patient request. Continue to monitor.      Relevant Medications   cyclobenzaprine  (FLEXERIL) 5 MG tablet   Other Relevant Orders   Ambulatory referral to Pain Clinic   Insomnia - Primary     Insomnia remains labile with current medication regimen and no adverse side effects sleeping approximately 3-4 hours per night. Increase Lunesta. Encouraged continued good sleep hygiene. Does have significant history for obstructive sleep apnea with no current usage of CPAP. Continue to monitor and follow-up pending trial of increased medication.      Chronic back pain     Symptoms and exam consistent with chronic low back pain status post surgical repair with no significant improvements with conservative treatment and previously managed by pain management.  Recommend continued treatment with ice/moist heat, home exercise therapy, and continue current dosage of Flexeril. Referral to pain management place per patient request.Continue to monitor.      Relevant Medications  cyclobenzaprine (FLEXERIL) 5 MG tablet   Other Relevant Orders   Ambulatory referral to Pain Clinic       I have discontinued Ms. Toback's eszopiclone. I am also having her start on eszopiclone. Additionally, I am having her maintain her OLANZapine, loxapine, metoprolol, naproxen sodium, levothyroxine, polyethylene glycol powder, docusate sodium, atorvastatin, furosemide, LINZESS, and cyclobenzaprine.   Meds ordered this encounter  Medications  . eszopiclone (LUNESTA) 2 MG TABS tablet    Sig: Take 1 tablet (2 mg total) by mouth at bedtime as needed for sleep. Take immediately before bedtime    Dispense:  30 tablet    Refill:  0    Order Specific Question:   Supervising Provider    Answer:   Pricilla Holm A [id number]  . cyclobenzaprine (FLEXERIL) 5 MG tablet    Sig: TAKE 1 TABLET BY MOUTH THREE TIMES A DAY AS NEEDED FOR MUSCLE SPASM    Dispense:  90 tablet    Refill:  0    Order Specific Question:   Supervising Provider    Answer:   Pricilla Holm A [id number]     Follow-up: Return in about 1  month (around 04/24/2016), or if symptoms worsen or fail to improve.  Mauricio Po, FNP

## 2016-03-27 NOTE — Assessment & Plan Note (Signed)
Insomnia remains labile with current medication regimen and no adverse side effects sleeping approximately 3-4 hours per night. Increase Lunesta. Encouraged continued good sleep hygiene. Does have significant history for obstructive sleep apnea with no current usage of CPAP. Continue to monitor and follow-up pending trial of increased medication.

## 2016-03-27 NOTE — Assessment & Plan Note (Signed)
Right shoulder pain with pain most of the in the posterior aspect of vertebral border of the scapula and no significant tenderness of rotator cuff. She conservatively with ice/moist heat and home exercis therapy. Referral to pain management place per patient request. Continue to monitor.

## 2016-04-09 ENCOUNTER — Other Ambulatory Visit: Payer: Self-pay | Admitting: Family

## 2016-04-28 ENCOUNTER — Other Ambulatory Visit: Payer: Self-pay | Admitting: Family

## 2016-04-28 ENCOUNTER — Other Ambulatory Visit: Payer: Self-pay | Admitting: Internal Medicine

## 2016-04-28 DIAGNOSIS — M5442 Lumbago with sciatica, left side: Secondary | ICD-10-CM

## 2016-04-28 DIAGNOSIS — G8929 Other chronic pain: Secondary | ICD-10-CM

## 2016-04-28 DIAGNOSIS — M25511 Pain in right shoulder: Principal | ICD-10-CM

## 2016-04-29 NOTE — Telephone Encounter (Signed)
Faxed

## 2016-04-29 NOTE — Telephone Encounter (Signed)
Patient called in stating she needs medication sent in today

## 2016-04-29 NOTE — Telephone Encounter (Signed)
Notified patient.

## 2016-04-29 NOTE — Progress Notes (Deleted)
HPI: FU atrial fibrillation. Pt is legally blind. Chest CT August 2016 showed emphysema, calcific aortic and coronary atherosclerosis. Echocardiogram March 2016 showed ejection fraction 45-50%. There was mild to moderate mitral regurgitation and mild to moderate pulmonary hypertension. Patient was seen in March 2016 after being found down on the floor. She was in atrial fibrillation but converted to sinus rhythm. It was noted at that time that she had some confusion and was bipolar. We elected not to begin anticoagulation. Echo repeated 5/17 and showed normal LV function; grade 1 DD, mild TR. Since last seen,   Current Outpatient Prescriptions  Medication Sig Dispense Refill  . atorvastatin (LIPITOR) 40 MG tablet TAKE 1 TABLET BY MOUTH IN THE EVENING 30 tablet 0  . cyclobenzaprine (FLEXERIL) 5 MG tablet TAKE 1 TABLET BY MOUTH THREE TIMES A DAY AS NEEDED FOR MUSCLE SPASM 90 tablet 0  . docusate sodium (COLACE) 100 MG capsule Take 1-2 capsules (100-200 mg total) by mouth 2 (two) times daily as needed for mild constipation. (Patient taking differently: Take 200 mg by mouth 2 (two) times daily as needed for mild constipation. Take 2 tablets daily at bedtime) 60 capsule 11  . eszopiclone (LUNESTA) 2 MG TABS tablet TAKE 1 TABLET BY MOUTH AT BEDTIME AS NEEDED FOR SLEEP 30 tablet 0  . furosemide (LASIX) 40 MG tablet TAKE 1 TABLET (40 MG TOTAL) BY MOUTH DAILY. 30 tablet 0  . levothyroxine (SYNTHROID, LEVOTHROID) 137 MCG tablet TAKE 1 TABLET BY MOUTH DAILY BEFORE BREAKFAST 30 tablet 5  . LINZESS 145 MCG CAPS capsule TAKE 1 CAPSULE (145 MCG TOTAL) BY MOUTH DAILY BEFORE BREAKFAST. 90 capsule 0  . loxapine (LOXITANE) 10 MG capsule Take 10 mg by mouth at bedtime.    . metoprolol (LOPRESSOR) 50 MG tablet Take 50 mg by mouth daily.    . naproxen sodium (ANAPROX) 220 MG tablet Take 440 mg by mouth 4 (four) times daily as needed (pain).    Marland Kitchen OLANZapine (ZYPREXA) 10 MG tablet Take 10 mg by mouth daily.    .  polyethylene glycol powder (GLYCOLAX/MIRALAX) powder MIX 17G AS DIRECTED AND DRINK ONCE DAILY 255 g 2   No current facility-administered medications for this visit.      Past Medical History:  Diagnosis Date  . Bipolar 1 disorder ([place])   . Cancer [facility])    cancer of uterus...no chemo or radiation  . Chicken pox   . Depression   . Diabetes mellitus    dx within last yr....just takes pills  . Dyslipidemia   . Hypothyroid   . Retinal detachment   . Sleep apnea     Past Surgical History:  Procedure Laterality Date  . ABDOMINAL HYSTERECTOMY    . LUMBAR LAMINECTOMY/DECOMPRESSION MICRODISCECTOMY  07/17/2011   Procedure: LUMBAR LAMINECTOMY/DECOMPRESSION MICRODISCECTOMY;  Surgeon: Sinclair Ship, MD;  Location: [place];  Service: Orthopedics;  Laterality: Left;  Left sided lumbar 4-5 microdisectomy  . TONSILLECTOMY    . TOTAL ABDOMINAL HYSTERECTOMY W/ BILATERAL SALPINGOOPHORECTOMY      Social History   Social History  . Marital status: Divorced    Spouse name: N/A  . Number of children: 2  . Years of education: 12   Occupational History  . Disability    Social History Main Topics  . Smoking status: Current Every Day Smoker    Packs/day: 0.25    Years: 35.00    Types: Cigarettes    Last attempt to quit: 07/09/1999  . Smokeless tobacco:  Not on file  . Alcohol use No     Comment: socially  . Drug use: No  . Sexual activity: Not on file   Other Topics Concern  . Not on file   Social History Narrative   Denies abuse and feels safe at home.     Family History  Problem Relation Age of Onset  . Healthy Mother   . Healthy Father     ROS: no fevers or chills, productive cough, hemoptysis, dysphasia, odynophagia, melena, hematochezia, dysuria, hematuria, rash, seizure activity, orthopnea, PND, pedal edema, claudication. Remaining systems are negative.  Physical Exam: Well-developed well-nourished in no acute distress.  Skin is warm and dry.  HEENT is normal.    Neck is supple.  Chest is clear to auscultation with normal expansion.  Cardiovascular exam is regular rate and rhythm.  Abdominal exam nontender or distended. No masses palpated. Extremities show no edema. neuro grossly intact  ECG- personally reviewed  A/P  1  Kirk Ruths, MD

## 2016-04-29 NOTE — Telephone Encounter (Signed)
Patient called in regard.  Needs medication today.

## 2016-05-01 ENCOUNTER — Telehealth: Payer: Self-pay | Admitting: Family

## 2016-05-01 ENCOUNTER — Encounter: Payer: Self-pay | Admitting: Cardiology

## 2016-05-01 NOTE — Telephone Encounter (Signed)
Pt called to see if we could send in a prescription for Chantix to help her quit smoking to [facility]. Please advise.  Thanks [employer]

## 2016-05-01 NOTE — Telephone Encounter (Signed)
Please advise 

## 2016-05-02 MED ORDER — VARENICLINE TARTRATE 0.5 MG X 11 & 1 MG X 42 PO MISC: ORAL | 0 refills | Status: DC

## 2016-05-02 MED ORDER — VARENICLINE TARTRATE 1 MG PO TABS: 1.0000 mg | ORAL_TABLET | Freq: Two times a day (BID) | ORAL | 1 refills | Status: DC

## 2016-05-02 NOTE — Telephone Encounter (Signed)
Medication printed and to be faxed.  

## 2016-05-05 ENCOUNTER — Telehealth: Payer: Self-pay | Admitting: *Deleted

## 2016-05-05 NOTE — Telephone Encounter (Signed)
Faxed

## 2016-05-05 NOTE — Telephone Encounter (Signed)
Rec'd call pt states the Lunesta doesn't work for her she would like to have [place] instead...Kelsey Hanna

## 2016-05-05 NOTE — Telephone Encounter (Signed)
Rec'd another call pt requesting same msg below. She states if rx can be sent to [facility] by 5 they will deliver it to her in the am. Inform pt will let Marya Amsler know and give her a call bck when he responds...Johny Chess

## 2016-05-06 MED ORDER — ZOLPIDEM TARTRATE 5 MG PO TABS: 5.0000 mg | ORAL_TABLET | Freq: Every evening | ORAL | 0 refills | Status: DC | PRN

## 2016-05-06 NOTE — Telephone Encounter (Signed)
Pt called again. She is requesting the ambien, chantix and discontinue the lunesta.   Please advise.   Pharmacy is [place]

## 2016-05-06 NOTE — Telephone Encounter (Signed)
Medication printed to be faxed.  

## 2016-05-07 NOTE — Telephone Encounter (Signed)
Notified pt rx's has been faxed to [facility]...Kelsey Hanna

## 2016-05-12 ENCOUNTER — Ambulatory Visit: Payer: Medicare Other | Admitting: Cardiology

## 2016-05-20 ENCOUNTER — Telehealth: Payer: Self-pay | Admitting: Family

## 2016-05-20 DIAGNOSIS — M25511 Pain in right shoulder: Secondary | ICD-10-CM

## 2016-05-20 DIAGNOSIS — G8929 Other chronic pain: Secondary | ICD-10-CM

## 2016-05-20 DIAGNOSIS — M5442 Lumbago with sciatica, left side: Principal | ICD-10-CM

## 2016-05-20 NOTE — Telephone Encounter (Signed)
Referral put in.

## 2016-05-20 NOTE — Telephone Encounter (Signed)
Pt would like referral to the Pain Consultant in [place] [address] [phone]

## 2016-06-05 ENCOUNTER — Other Ambulatory Visit: Payer: Self-pay | Admitting: Family

## 2016-06-06 MED ORDER — ZOLPIDEM TARTRATE 5 MG PO TABS
5.0000 mg | ORAL_TABLET | Freq: Every evening | ORAL | 0 refills | Status: DC | PRN
Start: 2016-06-06 — End: 2016-07-04

## 2016-06-09 ENCOUNTER — Other Ambulatory Visit: Payer: Self-pay | Admitting: Family

## 2016-06-09 DIAGNOSIS — K5900 Constipation, unspecified: Secondary | ICD-10-CM

## 2016-06-23 ENCOUNTER — Other Ambulatory Visit: Payer: Self-pay | Admitting: Family

## 2016-06-23 DIAGNOSIS — M25511 Pain in right shoulder: Principal | ICD-10-CM

## 2016-06-23 DIAGNOSIS — M5432 Sciatica, left side: Secondary | ICD-10-CM | POA: Diagnosis not present

## 2016-06-23 DIAGNOSIS — G8929 Other chronic pain: Secondary | ICD-10-CM

## 2016-06-23 DIAGNOSIS — M5442 Lumbago with sciatica, left side: Secondary | ICD-10-CM

## 2016-06-23 DIAGNOSIS — M545 Low back pain: Secondary | ICD-10-CM | POA: Diagnosis not present

## 2016-06-23 DIAGNOSIS — M25562 Pain in left knee: Secondary | ICD-10-CM | POA: Diagnosis not present

## 2016-06-23 DIAGNOSIS — G894 Chronic pain syndrome: Secondary | ICD-10-CM | POA: Diagnosis not present

## 2016-07-04 ENCOUNTER — Other Ambulatory Visit: Payer: Self-pay | Admitting: Family

## 2016-07-04 NOTE — Telephone Encounter (Signed)
Needs blood work for levothyroxine refills after this.

## 2016-07-04 NOTE — Telephone Encounter (Signed)
Rx faxed

## 2016-07-04 NOTE — Telephone Encounter (Signed)
[place] refill of ambien was 06/06/16 per [facility]

## 2016-07-21 DIAGNOSIS — G894 Chronic pain syndrome: Secondary | ICD-10-CM | POA: Diagnosis not present

## 2016-07-21 DIAGNOSIS — M5432 Sciatica, left side: Secondary | ICD-10-CM | POA: Diagnosis not present

## 2016-07-21 DIAGNOSIS — M25562 Pain in left knee: Secondary | ICD-10-CM | POA: Diagnosis not present

## 2016-07-21 DIAGNOSIS — M545 Low back pain: Secondary | ICD-10-CM | POA: Diagnosis not present

## 2016-08-04 ENCOUNTER — Other Ambulatory Visit: Payer: Self-pay | Admitting: Family

## 2016-08-14 DIAGNOSIS — G894 Chronic pain syndrome: Secondary | ICD-10-CM | POA: Diagnosis not present

## 2016-08-14 DIAGNOSIS — M545 Low back pain: Secondary | ICD-10-CM | POA: Diagnosis not present

## 2016-09-04 ENCOUNTER — Other Ambulatory Visit: Payer: Self-pay | Admitting: Family

## 2016-09-11 DIAGNOSIS — M545 Low back pain: Secondary | ICD-10-CM | POA: Diagnosis not present

## 2016-09-11 DIAGNOSIS — G894 Chronic pain syndrome: Secondary | ICD-10-CM | POA: Diagnosis not present

## 2016-09-11 DIAGNOSIS — Z79891 Long term (current) use of opiate analgesic: Secondary | ICD-10-CM | POA: Diagnosis not present

## 2016-09-12 ENCOUNTER — Other Ambulatory Visit: Payer: Self-pay | Admitting: Family

## 2016-09-12 DIAGNOSIS — K5900 Constipation, unspecified: Secondary | ICD-10-CM

## 2016-09-26 ENCOUNTER — Other Ambulatory Visit: Payer: Self-pay | Admitting: Family

## 2016-09-30 NOTE — Telephone Encounter (Signed)
Pt called checking on the Zolpidem prescription.

## 2016-09-30 NOTE — Telephone Encounter (Signed)
Pt called back checking on this. She would like a call when it has been sent.

## 2016-10-01 NOTE — Telephone Encounter (Signed)
Pt.notified

## 2016-10-01 NOTE — Telephone Encounter (Signed)
Notified pt rx has been faxed to [facility]...Johny Chess

## 2016-10-15 DIAGNOSIS — Z79891 Long term (current) use of opiate analgesic: Secondary | ICD-10-CM | POA: Diagnosis not present

## 2016-10-15 DIAGNOSIS — M545 Low back pain: Secondary | ICD-10-CM | POA: Diagnosis not present

## 2016-10-15 DIAGNOSIS — G894 Chronic pain syndrome: Secondary | ICD-10-CM | POA: Diagnosis not present

## 2016-10-22 ENCOUNTER — Other Ambulatory Visit: Payer: Self-pay | Admitting: Family

## 2016-10-23 NOTE — Telephone Encounter (Signed)
Last refill was 10/01/16 per [facility] [facility] for [facility]

## 2016-10-24 NOTE — Telephone Encounter (Signed)
Rx faxed

## 2016-10-27 ENCOUNTER — Other Ambulatory Visit: Payer: Self-pay | Admitting: Family

## 2016-10-28 ENCOUNTER — Telehealth: Payer: Self-pay | Admitting: Family

## 2016-10-28 NOTE — Telephone Encounter (Signed)
varenicline (CHANTIX CONTINUING MONTH PAK) 1 MG tablet   Patient is requesting a refill on this medication. She started smoking again and wants to stop. Please advise.  [place], [place] [phone] (Phone) [phone] (Fax)

## 2016-11-04 NOTE — Telephone Encounter (Signed)
Per pt no when rx was given she took for week or so and started back smoking...Kelsey Hanna

## 2016-11-04 NOTE — Telephone Encounter (Signed)
Is she still taking the medication or does she need to start again?

## 2016-11-05 MED ORDER — VARENICLINE TARTRATE 1 MG PO TABS
1.0000 mg | ORAL_TABLET | Freq: Two times a day (BID) | ORAL | 1 refills | Status: DC
Start: 2016-11-05 — End: 2017-01-24

## 2016-11-05 MED ORDER — VARENICLINE TARTRATE 0.5 MG X 11 & 1 MG X 42 PO MISC: ORAL | 0 refills | Status: DC

## 2016-11-05 NOTE — Telephone Encounter (Signed)
Medication printed to be faxed due to signature length.

## 2016-11-05 NOTE — Addendum Note (Signed)
Addended by: Mauricio Po D on: 11/05/2016 02:47 PM   Modules accepted: Orders

## 2016-11-06 NOTE — Telephone Encounter (Signed)
Faxed script to [facility].../lm,b

## 2016-11-10 ENCOUNTER — Other Ambulatory Visit: Payer: Self-pay | Admitting: Family

## 2016-11-13 DIAGNOSIS — M545 Low back pain: Secondary | ICD-10-CM | POA: Diagnosis not present

## 2016-11-13 DIAGNOSIS — G894 Chronic pain syndrome: Secondary | ICD-10-CM | POA: Diagnosis not present

## 2016-11-13 DIAGNOSIS — Z79891 Long term (current) use of opiate analgesic: Secondary | ICD-10-CM | POA: Diagnosis not present

## 2016-11-20 ENCOUNTER — Telehealth: Payer: Self-pay | Admitting: General Practice

## 2016-11-20 MED ORDER — FUROSEMIDE 40 MG PO TABS: ORAL_TABLET | ORAL | 0 refills | Status: DC

## 2016-11-20 NOTE — Telephone Encounter (Signed)
Pt need refill on this med but wants it to say PRN.  She only wants to use it when she see fluid building up

## 2016-11-20 NOTE — Telephone Encounter (Signed)
Pt is due for f/u appt sent 30 day script until she f/u w/Greg...Johny Chess

## 2016-12-11 ENCOUNTER — Ambulatory Visit: Payer: Self-pay | Admitting: Nurse Practitioner

## 2016-12-12 DIAGNOSIS — G894 Chronic pain syndrome: Secondary | ICD-10-CM | POA: Diagnosis not present

## 2016-12-12 DIAGNOSIS — Z79891 Long term (current) use of opiate analgesic: Secondary | ICD-10-CM | POA: Diagnosis not present

## 2016-12-12 DIAGNOSIS — M545 Low back pain: Secondary | ICD-10-CM | POA: Diagnosis not present

## 2016-12-22 ENCOUNTER — Ambulatory Visit: Payer: Self-pay | Admitting: Nurse Practitioner

## 2017-01-15 DIAGNOSIS — G894 Chronic pain syndrome: Secondary | ICD-10-CM | POA: Diagnosis not present

## 2017-01-15 DIAGNOSIS — Z79891 Long term (current) use of opiate analgesic: Secondary | ICD-10-CM | POA: Diagnosis not present

## 2017-01-15 DIAGNOSIS — M545 Low back pain: Secondary | ICD-10-CM | POA: Diagnosis not present

## 2017-01-23 ENCOUNTER — Encounter (HOSPITAL_COMMUNITY): Payer: Self-pay | Admitting: Emergency Medicine

## 2017-01-23 ENCOUNTER — Other Ambulatory Visit: Payer: Self-pay

## 2017-01-23 ENCOUNTER — Observation Stay (HOSPITAL_COMMUNITY)
Admission: EM | Admit: 2017-01-23 | Discharge: 2017-01-24 | Disposition: A | Discharge status: Home / Self Care | Payer: Medicare Other | Attending: Internal Medicine | Admitting: Internal Medicine

## 2017-01-23 ENCOUNTER — Emergency Department (HOSPITAL_COMMUNITY): Payer: Medicare Other

## 2017-01-23 DIAGNOSIS — F319 Bipolar disorder, unspecified: Secondary | ICD-10-CM | POA: Diagnosis not present

## 2017-01-23 DIAGNOSIS — R4781 Slurred speech: Secondary | ICD-10-CM | POA: Diagnosis not present

## 2017-01-23 DIAGNOSIS — R202 Paresthesia of skin: Secondary | ICD-10-CM | POA: Diagnosis not present

## 2017-01-23 DIAGNOSIS — I671 Cerebral aneurysm, nonruptured: Secondary | ICD-10-CM | POA: Diagnosis not present

## 2017-01-23 DIAGNOSIS — I639 Cerebral infarction, unspecified: Secondary | ICD-10-CM | POA: Diagnosis not present

## 2017-01-23 DIAGNOSIS — G4730 Sleep apnea, unspecified: Secondary | ICD-10-CM | POA: Insufficient documentation

## 2017-01-23 DIAGNOSIS — G459 Transient cerebral ischemic attack, unspecified: Secondary | ICD-10-CM | POA: Diagnosis not present

## 2017-01-23 DIAGNOSIS — R471 Dysarthria and anarthria: Secondary | ICD-10-CM

## 2017-01-23 DIAGNOSIS — I480 Paroxysmal atrial fibrillation: Secondary | ICD-10-CM | POA: Diagnosis present

## 2017-01-23 DIAGNOSIS — I6789 Other cerebrovascular disease: Secondary | ICD-10-CM | POA: Diagnosis not present

## 2017-01-23 DIAGNOSIS — I110 Hypertensive heart disease with heart failure: Secondary | ICD-10-CM | POA: Diagnosis not present

## 2017-01-23 DIAGNOSIS — R200 Anesthesia of skin: Secondary | ICD-10-CM

## 2017-01-23 DIAGNOSIS — E785 Hyperlipidemia, unspecified: Secondary | ICD-10-CM | POA: Insufficient documentation

## 2017-01-23 DIAGNOSIS — M6281 Muscle weakness (generalized): Secondary | ICD-10-CM | POA: Diagnosis present

## 2017-01-23 DIAGNOSIS — Z66 Do not resuscitate: Secondary | ICD-10-CM | POA: Insufficient documentation

## 2017-01-23 DIAGNOSIS — I10 Essential (primary) hypertension: Secondary | ICD-10-CM | POA: Diagnosis present

## 2017-01-23 DIAGNOSIS — E039 Hypothyroidism, unspecified: Secondary | ICD-10-CM | POA: Diagnosis not present

## 2017-01-23 DIAGNOSIS — I5032 Chronic diastolic (congestive) heart failure: Secondary | ICD-10-CM | POA: Diagnosis present

## 2017-01-23 DIAGNOSIS — E119 Type 2 diabetes mellitus without complications: Secondary | ICD-10-CM | POA: Diagnosis not present

## 2017-01-23 DIAGNOSIS — R29818 Other symptoms and signs involving the nervous system: Secondary | ICD-10-CM | POA: Diagnosis not present

## 2017-01-23 DIAGNOSIS — F17210 Nicotine dependence, cigarettes, uncomplicated: Secondary | ICD-10-CM | POA: Diagnosis not present

## 2017-01-23 LAB — DIFFERENTIAL
BASOS ABS: 0 10*3/uL (ref 0.0–0.1)
BASOS PCT: 0 %
Eosinophils Absolute: 0.2 10*3/uL (ref 0.0–0.7)
Eosinophils Relative: 2 %
LYMPHS ABS: 3.1 10*3/uL (ref 0.7–4.0)
Lymphocytes Relative: 36 %
MONO ABS: 0.5 10*3/uL (ref 0.1–1.0)
MONOS PCT: 5 %
NEUTROS ABS: 4.9 10*3/uL (ref 1.7–7.7)
Neutrophils Relative %: 57 %

## 2017-01-23 LAB — CBC
HEMATOCRIT: 40.6 % (ref 36.0–46.0)
HEMOGLOBIN: 12.8 g/dL (ref 12.0–15.0)
MCH: 28.7 pg (ref 26.0–34.0)
MCHC: 31.5 g/dL (ref 30.0–36.0)
MCV: 91 fL (ref 78.0–100.0)
Platelets: 230 10*3/uL (ref 150–400)
RBC: 4.46 MIL/uL (ref 3.87–5.11)
RDW: 14.8 % (ref 11.5–15.5)
WBC: 8.7 10*3/uL (ref 4.0–10.5)

## 2017-01-23 LAB — I-STAT CHEM 8, ED
BUN: 11 mg/dL (ref 6–20)
CALCIUM ION: 1.14 mmol/L — AB (ref 1.15–1.40)
CREATININE: 0.8 mg/dL (ref 0.44–1.00)
Chloride: 104 mmol/L (ref 101–111)
GLUCOSE: 122 mg/dL — AB (ref 65–99)
HCT: 39 % (ref 36.0–46.0)
HEMOGLOBIN: 13.3 g/dL (ref 12.0–15.0)
Potassium: 4.1 mmol/L (ref 3.5–5.1)
Sodium: 142 mmol/L (ref 135–145)
TCO2: 27 mmol/L (ref 22–32)

## 2017-01-23 LAB — CBG MONITORING, ED: Glucose-Capillary: 99 mg/dL (ref 65–99)

## 2017-01-23 LAB — COMPREHENSIVE METABOLIC PANEL
ALK PHOS: 75 U/L (ref 38–126)
ALT: 36 U/L (ref 14–54)
AST: 27 U/L (ref 15–41)
Albumin: 3.8 g/dL (ref 3.5–5.0)
Anion gap: 10 (ref 5–15)
BILIRUBIN TOTAL: 0.5 mg/dL (ref 0.3–1.2)
BUN: 10 mg/dL (ref 6–20)
CALCIUM: 9.5 mg/dL (ref 8.9–10.3)
CO2: 27 mmol/L (ref 22–32)
CREATININE: 0.94 mg/dL (ref 0.44–1.00)
Chloride: 104 mmol/L (ref 101–111)
Glucose, Bld: 123 mg/dL — ABNORMAL HIGH (ref 65–99)
Potassium: 4.1 mmol/L (ref 3.5–5.1)
Sodium: 141 mmol/L (ref 135–145)
TOTAL PROTEIN: 6.5 g/dL (ref 6.5–8.1)

## 2017-01-23 LAB — APTT: APTT: 30 s (ref 24–36)

## 2017-01-23 LAB — I-STAT TROPONIN, ED: TROPONIN I, POC: 0 ng/mL (ref 0.00–0.08)

## 2017-01-23 LAB — PROTIME-INR
INR: 1.01
Prothrombin Time: 13.2 seconds (ref 11.4–15.2)

## 2017-01-23 LAB — ETHANOL

## 2017-01-23 MED ORDER — LORAZEPAM 1 MG PO TABS
1.0000 mg | ORAL_TABLET | Freq: Once | ORAL | Status: AC
  Administered 2017-01-23: 1 mg via ORAL
  Filled 2017-01-23: qty 1

## 2017-01-23 NOTE — ED Notes (Signed)
Check CBG 99, RN Seth Bake informed

## 2017-01-23 NOTE — ED Provider Notes (Signed)
TIME SEEN: 11:11 PM  CHIEF COMPLAINT: Code Stroke  HPI: Patient is a 66 year old female with history of dyslipidemia, diabetes, hypothyroidism, bipolar disorder who presents to the emergency department with concerns that she could have had a stroke.  She states around 6 PM she had a difficult time walking and noticed that her speech was slurred.  While here in the emergency department she has noticed that her left face, arm and leg are numb.  States she chronically has some tingling in that left leg from chronic back pain but it is never numb.  No headache or head injury.  Not on blood thinners.  No chest pain or shortness of breath.  No fevers, cough, vomiting or diarrhea.  ROS: See HPI Constitutional: no fever  Eyes: no drainage  ENT: no runny nose   Cardiovascular:  no chest pain  Resp: no SOB  GI: no vomiting GU: no dysuria Integumentary: no rash  Allergy: no hives  Musculoskeletal: no leg swelling  Neurological: no slurred speech ROS otherwise negative  PAST MEDICAL HISTORY/PAST SURGICAL HISTORY:  Past Medical History:  Diagnosis Date  . Bipolar 1 disorder ([place])   . Cancer [facility])    cancer of uterus...no chemo or radiation  . Chicken pox   . Depression   . Diabetes mellitus    dx within last yr....just takes pills  . Dyslipidemia   . Hypothyroid   . Retinal detachment   . Sleep apnea     MEDICATIONS:  Prior to Admission medications   Medication Sig Start Date End Date Taking? Authorizing Provider  atorvastatin (LIPITOR) 40 MG tablet TAKE 1 TABLET BY MOUTH IN THE EVENING 10/27/16   Golden Circle, FNP  cyclobenzaprine (FLEXERIL) 5 MG tablet TAKE 1 TABLET BY MOUTH THREE TIMES A DAY AS NEEDED FOR MUSCLE SPASM 06/24/16   Golden Circle, FNP  DOCQLACE 100 MG capsule TAKE 1 TO 2 CAPSULES BY MOUTH TWICE A DAY AS NEEDED FOR MILD CONSTIPATION 11/11/16   Golden Circle, FNP  furosemide (LASIX) 40 MG tablet TAKE 1 TABLET (40 MG TOTAL) BY MOUTH DAILY.PRN 11/20/16   Golden Circle, FNP  levothyroxine (SYNTHROID, LEVOTHROID) 137 MCG tablet TAKE 1 TABLET BY MOUTH DAILY BEFORE BREAKFAST 10/24/16   Golden Circle, FNP  LINZESS 145 MCG CAPS capsule TAKE 1 CAPSULE (145 MCG TOTAL) BY MOUTH DAILY BEFORE BREAKFAST. 09/15/16   Golden Circle, FNP  loxapine (LOXITANE) 10 MG capsule Take 10 mg by mouth at bedtime. 10/30/14 10/30/15  [provider]  metoprolol (LOPRESSOR) 50 MG tablet Take 50 mg by mouth daily. 04/25/15   [provider]  naproxen sodium (ANAPROX) 220 MG tablet Take 440 mg by mouth 4 (four) times daily as needed (pain).    [provider]  OLANZapine (ZYPREXA) 10 MG tablet Take 10 mg by mouth daily. 04/27/15   [provider]  polyethylene glycol powder (GLYCOLAX/MIRALAX) powder MIX 17G AS DIRECTED AND DRINK ONCE DAILY 10/30/15   Golden Circle, FNP  varenicline (CHANTIX CONTINUING MONTH PAK) 1 MG tablet Take 1 tablet (1 mg total) by mouth 2 (two) times daily. 11/05/16   Golden Circle, FNP  varenicline (CHANTIX STARTING MONTH PAK) 0.5 MG X 11 & 1 MG X 42 tablet Take one 0.5 mg tablet by mouth once daily for 3 days, then increase to one 0.5 mg tablet twice daily for 4 days, then increase to one 1 mg tablet twice daily. 11/05/16   Golden Circle, FNP  zolpidem (AMBIEN) 5 MG tablet TAKE 1 TABLET BY MOUTH AT BEDTIME AS NEEDED FOR SLEEP 10/24/16   Golden Circle, FNP    ALLERGIES:  No Known Allergies  SOCIAL HISTORY:  Social History   Tobacco Use  . Smoking status: Current Every Day Smoker    Packs/day: 0.25    Years: 35.00    Pack years: 8.75    Types: Cigarettes    Last attempt to quit: 07/09/1999    Years since quitting: 17.5  . Smokeless tobacco: Never Used  Substance Use Topics  . Alcohol use: No    Alcohol/week: 0.0 oz    Comment: socially    FAMILY HISTORY: Family History  Problem Relation Age of Onset  . Healthy Mother   . Healthy Father     EXAM: BP (!) 102/59   Pulse 70   Temp 97.9 F  (36.6 C) (Oral)   Resp (!) 21   Ht 5\' 3"  (1.6 m)   Wt 98.8 kg (217 lb 13 oz)   SpO2 98%   BMI 38.58 kg/m  CONSTITUTIONAL: Alert and oriented x3 and responds appropriately to questions. Well-appearing; well-nourished HEAD: Normocephalic EYES: Conjunctivae clear, pupils appear equal, EOMI ENT: normal nose; moist mucous membranes NECK: Supple, no meningismus, no nuchal rigidity, no LAD  CARD: RRR; S1 and S2 appreciated; no murmurs, no clicks, no rubs, no gallops RESP: Normal chest excursion without splinting or tachypnea; breath sounds clear and equal bilaterally; no wheezes, no rhonchi, no rales, no hypoxia or respiratory distress, speaking full sentences ABD/GI: Normal bowel sounds; non-distended; soft, non-tender, no rebound, no guarding, no peritoneal signs, no hepatosplenomegaly BACK:  The back appears normal and is non-tender to palpation, there is no CVA tenderness EXT: Normal ROM in all joints; non-tender to palpation; no edema; normal capillary refill; no cyanosis, no calf tenderness or swelling    SKIN: Normal color for age and race; warm; no rash NEURO: Moves all extremities equally, diminished sensation in the left face arm and leg compared to the right.  Otherwise normal sensation throughout the right side.  No pronator drift noted.  Strength 5/5 in all 4 extremities.  Cranial nerves II through XII intact.  She has mild dysarthria without aphasia.  For me her NIH stroke scale is 2. PSYCH: The patient's mood and manner are appropriate. Grooming and personal hygiene are appropriate.  MEDICAL DECISION MAKING: Patient here as a code stroke.  Dr. Cheral Marker with neurology has seen the patient.  Appreciate his help.  His NIH stroke scale was 4.  He does appear to be improving.  She is not a TPA candidate given last seen normal was 6 PM and she does not appear to have a large vessel occlusion.  Code stroke canceled.  Head CT shows no acute abnormalities.  Labs show no acute abnormalities.  Will  discuss with hospitalist for admission for stroke workup.  ED PROGRESS:    12:22 AM Discussed patient's case with hospitalist, Dr. Myna Hidalgo.  I have recommended admission and patient (and family if present) agree with this plan. Admitting physician will place admission orders.   I reviewed all nursing notes, vitals, pertinent previous records, EKGs, lab and urine results, imaging (as available).     EKG Interpretation  Date/Time:  Friday January 23 2017 23:08:35 EST Ventricular Rate:  75 PR Interval:    QRS Duration: 101 QT Interval:  401 QTC Calculation: 448 R Axis:     Text Interpretation:  Sinus rhythm RSR' in V1 or V2,  right VCD or RVH Confirmed by Louna Rothgeb, Cyril Mourning [phone]) on 01/23/2017 11:11:30 PM          Eduardo Honor, Delice Bison, DO 01/24/17 0202

## 2017-01-23 NOTE — Consult Note (Signed)
Referring Physician: Dr. Leonides Schanz    Chief Complaint: Left sided weakness  HPI: Kelsey Hanna is an 66 y.o. female who was brought to the ED via EMS as a Code Stroke. Initial LKN reported as 2000, with patient clarifying in CT that LKN was 1800. She states that first symptoms consisted of slurred speech while speaking on the telephone with her friend. She also noted difficulty ambulating - initially on interview would not specify if this was due to lack of balance, bilateral or unilateral weakness, eventually stating that her left side was weak. Per EMS, her vitals were as follows: BP 107/65, HR 75, SPO2  185, CBG 185.  She does not take an antiplatelet medication and is not on an anticoagulant.   PMHx includes bipolar disorder, uterine cancer s/p TAH/BSO, LBP status post lumbar laminectomy/microdiscectomy, depression, DM, dyslipidemia, hypothyroidism, left retinal detachment and sleep apnea. She has no prior history of stroke.   LSN: 1800 tPA Given: No: Out of time window.   Past Medical History:  Diagnosis Date  . Bipolar 1 disorder ([place])   . Cancer [facility])    cancer of uterus...no chemo or radiation  . Chicken pox   . Depression   . Diabetes mellitus    dx within last yr....just takes pills  . Dyslipidemia   . Hypothyroid   . Retinal detachment   . Sleep apnea     Past Surgical History:  Procedure Laterality Date  . ABDOMINAL HYSTERECTOMY    . LUMBAR LAMINECTOMY/DECOMPRESSION MICRODISCECTOMY  07/17/2011   Procedure: LUMBAR LAMINECTOMY/DECOMPRESSION MICRODISCECTOMY;  Surgeon: Sinclair Ship, MD;  Location: [place];  Service: Orthopedics;  Laterality: Left;  Left sided lumbar 4-5 microdisectomy  . TONSILLECTOMY    . TOTAL ABDOMINAL HYSTERECTOMY W/ BILATERAL SALPINGOOPHORECTOMY      Family History  Problem Relation Age of Onset  . Healthy Mother   . Healthy Father    Social History:  reports that she has been smoking cigarettes.  She has a 8.75 pack-year smoking history.  she has never used smokeless tobacco. She reports that she does not drink alcohol or use drugs.  Allergies: No Known Allergies  Home Medications:  Loxapine Zyprexa Lipitor Flexeril Docqlace Lasix Synthroid Linzess Lopressor Anaprox Glycolax Chantix Ambien  ROS: As per HPI.   Physical Examination: Blood pressure (!) 102/59, pulse 70, temperature 97.9 F (36.6 C), temperature source Oral, resp. rate (!) 21, height '5\' 3"'$  (1.6 m), weight 98.8 kg (217 lb 13 oz), SpO2 98 %.  HEENT: [place]/AT. 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 Decreased hydration to oral mucosa. Lungs: Respirations unlabored Ext: Warm and well perfused  Neurologic Examination:  Assessed at 10:41 PM in CT Mental Status: Awake, alert and oriented to place and time. Non-agitated. Speech is fluent without errors of grammar or syntax. Naming and comprehension intact. Decreased eye contact. Somewhat flattened affect.  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 Unable to test visual fields of left eye due to severely decreased acuity (chronic due to eye injury). Right pupil round and reactive to light. Left pupil off-center pinpoint and unreactive.  III,IV, VI: Ptosis not present. EOM of right eye are intact. No nystagmus.  V,VII: No facial droop. Facial temp sensation normal bilaterally.  VIII: hearing intact to questions and commands IX,X: Palate rises symmetrically XI: Head at midline XII: midline tongue extension Motor: RUE and RLE 5/5. LUE: 4/5 proximal and distal - no  drift LLE: 4/5 proximal and distal - drift is present Bulk and tone normal x 4  Sensory: Temp and texture sensation decreased LUE and LLE. Normal sensation on the right.  Deep Tendon Reflexes:  2+ bilateral upper and lower extremities. Toes downgoing.  Cerebellar: No ataxia with FNF bilaterally Gait: Deferred  Results for orders  placed or performed during the hospital encounter of 01/23/17 (from the past 48 hour(s))  Ethanol     Status: None   Collection Time: 01/23/17 10:34 PM  Result Value Ref Range   Alcohol, Ethyl (B) <10 <10 mg/dL    Comment:        LOWEST DETECTABLE LIMIT FOR SERUM ALCOHOL IS 10 mg/dL FOR MEDICAL PURPOSES ONLY   Protime-INR     Status: None   Collection Time: 01/23/17 10:36 PM  Result Value Ref Range   Prothrombin Time 13.2 11.4 - 15.2 seconds   INR 1.01   APTT     Status: None   Collection Time: 01/23/17 10:36 PM  Result Value Ref Range   aPTT 30 24 - 36 seconds  CBC     Status: None   Collection Time: 01/23/17 10:36 PM  Result Value Ref Range   WBC 8.7 4.0 - 10.5 K/uL   RBC 4.46 3.87 - 5.11 MIL/uL   Hemoglobin 12.8 12.0 - 15.0 g/dL   HCT 40.6 36.0 - 46.0 %   MCV 91.0 78.0 - 100.0 fL   MCH 28.7 26.0 - 34.0 pg   MCHC 31.5 30.0 - 36.0 g/dL   RDW 14.8 11.5 - 15.5 %   Platelets 230 150 - 400 K/uL  Differential     Status: None   Collection Time: 01/23/17 10:36 PM  Result Value Ref Range   Neutrophils Relative % 57 %   Neutro Abs 4.9 1.7 - 7.7 K/uL   Lymphocytes Relative 36 %   Lymphs Abs 3.1 0.7 - 4.0 K/uL   Monocytes Relative 5 %   Monocytes Absolute 0.5 0.1 - 1.0 K/uL   Eosinophils Relative 2 %   Eosinophils Absolute 0.2 0.0 - 0.7 K/uL   Basophils Relative 0 %   Basophils Absolute 0.0 0.0 - 0.1 K/uL  Comprehensive metabolic panel     Status: Abnormal   Collection Time: 01/23/17 10:36 PM  Result Value Ref Range   Sodium 141 135 - 145 mmol/L   Potassium 4.1 3.5 - 5.1 mmol/L   Chloride 104 101 - 111 mmol/L   CO2 27 22 - 32 mmol/L   Glucose, Bld 123 (H) 65 - 99 mg/dL   BUN 10 6 - 20 mg/dL   Creatinine, Ser 0.94 0.44 - 1.00 mg/dL   Calcium 9.5 8.9 - 10.3 mg/dL   Total Protein 6.5 6.5 - 8.1 g/dL   Albumin 3.8 3.5 - 5.0 g/dL   AST 27 15 - 41 U/L   ALT 36 14 - 54 U/L   Alkaline Phosphatase 75 38 - 126 U/L   Total Bilirubin 0.5 0.3 - 1.2 mg/dL   GFR calc non Af Amer  >60 >60 mL/min   GFR calc Af Amer >60 >60 mL/min    Comment: (NOTE) The eGFR has been calculated using the CKD EPI equation. This calculation has not been validated in all clinical situations. eGFR's persistently <60 mL/min signify possible Chronic Kidney Disease.    Anion gap 10 5 - 15  I-stat troponin, ED     Status: None   Collection Time: 01/23/17 10:40 PM  Result Value Ref Range  Troponin i, poc 0.00 0.00 - 0.08 ng/mL   Comment 3            Comment: Due to the release kinetics of cTnI, a negative result within the first hours of the onset of symptoms does not rule out myocardial infarction with certainty. If myocardial infarction is still suspected, repeat the test at appropriate intervals.   I-Stat Chem 8, ED     Status: Abnormal   Collection Time: 01/23/17 10:42 PM  Result Value Ref Range   Sodium 142 135 - 145 mmol/L   Potassium 4.1 3.5 - 5.1 mmol/L   Chloride 104 101 - 111 mmol/L   BUN 11 6 - 20 mg/dL   Creatinine, Ser 0.80 0.44 - 1.00 mg/dL   Glucose, Bld 122 (H) 65 - 99 mg/dL   Calcium, Ion 1.14 (L) 1.15 - 1.40 mmol/L   TCO2 27 22 - 32 mmol/L   Hemoglobin 13.3 12.0 - 15.0 g/dL   HCT 39.0 36.0 - 46.0 %  CBG monitoring, ED     Status: None   Collection Time: 01/23/17 11:17 PM  Result Value Ref Range   Glucose-Capillary 99 65 - 99 mg/dL   Comment 1 Notify RN    Ct Head Code Stroke Wo Contrast  Result Date: 01/23/2017 CLINICAL DATA:  Code stroke. Initial evaluation for acute gait abnormality, slurred speech. EXAM: CT HEAD WITHOUT CONTRAST TECHNIQUE: Contiguous axial images were obtained from the base of the skull through the vertex without intravenous contrast. COMPARISON:  Prior MRI from 02/05/2015. FINDINGS: Brain: Moderate atrophy with chronic small vessel ischemic disease. No acute intracranial hemorrhage. No findings to suggest acute large vessel territory infarct. No mass lesion, midline shift or mass effect. No hydrocephalus. No extra-axial fluid collection.  Vascular: No hyperdense vessel. Scattered vascular calcifications noted within the carotid siphons. Skull: Scalp soft tissues and calvarium within normal limits. Sinuses/Orbits: Globes and orbital soft tissues within normal limits. Patient status post lens extraction bilaterally. Paranasal sinuses and mastoid air cells are clear. Other: None. ASPECTS [facility] Stroke Program Early CT Score) - Ganglionic level infarction (caudate, lentiform nuclei, internal capsule, insula, M1-M3 cortex): 7 - Supraganglionic infarction (M4-M6 cortex): 3 Total score (0-10 with 10 being normal): 10 IMPRESSION: 1. No acute intracranial infarct or other process identified. 2. ASPECTS is 10. 3. Chronic atrophy with moderate chronic small vessel ischemic disease. Critical Value/emergent results were called by telephone at the time of interpretation on 01/23/2017 at 11:02 pm to Dr. Cheral Marker, who verbally acknowledged these results. Electronically Signed   By: Jeannine Boga M.D.   On: 01/23/2017 23:03    Assessment: 66 y.o. female presenting with acute onset of left sided weakness, subjective dysarthria and gait instability 1. NIHSS = 4. LKN was 6 PM per information obtained during detailed interview of patient in CT. Not an IV tPA candidate based upon time criteria. Not a candidate for VIR as NIHSS is < 6 without aphasia or neglect; not clinically consistent with LVO. 2. Stroke Risk Factors - DM, dyslipidemia, sleep apnea    Plan: 1. MRI brain, MRA head 2. TTE 3. Carotid ultrasound 4. HgbA1c, fasting lipid panel 5. PT consult, OT consult, Speech consult 6. Cardiac telemetry 7. Start ASA 81 mg po qd 8. If indicated by results of stroke work up, consider starting a statin 9. Frequent neuro checks 10. Permissive HTN x 24 hours  '@Electronically'$  signed: Dr. Kerney Elbe  01/23/2017, 11:26 PM

## 2017-01-23 NOTE — ED Triage Notes (Signed)
Pt brought to ED by [facility] from home for a code stroke, per EMS LSW today at 2000, pt started having Slurred speech, abnormal gait mostly unable to walk, pt has a hx of chronic back pain. Per pt LSW was a 6 pm today. Pt not on blood thinners, denies any fall or injury. SR on monitor for EMS, BP 107/65, HR 75, SPO2  185, CBG 185.

## 2017-01-23 NOTE — ED Notes (Signed)
CT Head Discontinued

## 2017-01-24 ENCOUNTER — Emergency Department (HOSPITAL_COMMUNITY): Payer: Medicare Other

## 2017-01-24 ENCOUNTER — Observation Stay (HOSPITAL_BASED_OUTPATIENT_CLINIC_OR_DEPARTMENT_OTHER): Payer: Medicare Other

## 2017-01-24 ENCOUNTER — Other Ambulatory Visit (HOSPITAL_COMMUNITY): Payer: Self-pay

## 2017-01-24 ENCOUNTER — Observation Stay (HOSPITAL_COMMUNITY): Payer: Medicare Other

## 2017-01-24 ENCOUNTER — Encounter (HOSPITAL_COMMUNITY): Payer: Self-pay | Admitting: Family Medicine

## 2017-01-24 DIAGNOSIS — E039 Hypothyroidism, unspecified: Secondary | ICD-10-CM | POA: Diagnosis not present

## 2017-01-24 DIAGNOSIS — I6523 Occlusion and stenosis of bilateral carotid arteries: Secondary | ICD-10-CM | POA: Diagnosis not present

## 2017-01-24 DIAGNOSIS — I10 Essential (primary) hypertension: Secondary | ICD-10-CM

## 2017-01-24 DIAGNOSIS — R200 Anesthesia of skin: Secondary | ICD-10-CM | POA: Diagnosis not present

## 2017-01-24 DIAGNOSIS — R2990 Unspecified symptoms and signs involving the nervous system: Secondary | ICD-10-CM | POA: Diagnosis not present

## 2017-01-24 DIAGNOSIS — E118 Type 2 diabetes mellitus with unspecified complications: Secondary | ICD-10-CM | POA: Diagnosis not present

## 2017-01-24 DIAGNOSIS — I361 Nonrheumatic tricuspid (valve) insufficiency: Secondary | ICD-10-CM

## 2017-01-24 DIAGNOSIS — F319 Bipolar disorder, unspecified: Secondary | ICD-10-CM | POA: Diagnosis not present

## 2017-01-24 DIAGNOSIS — I639 Cerebral infarction, unspecified: Secondary | ICD-10-CM | POA: Diagnosis not present

## 2017-01-24 DIAGNOSIS — I671 Cerebral aneurysm, nonruptured: Secondary | ICD-10-CM | POA: Diagnosis not present

## 2017-01-24 DIAGNOSIS — M6281 Muscle weakness (generalized): Secondary | ICD-10-CM | POA: Diagnosis present

## 2017-01-24 DIAGNOSIS — R471 Dysarthria and anarthria: Secondary | ICD-10-CM

## 2017-01-24 DIAGNOSIS — I480 Paroxysmal atrial fibrillation: Secondary | ICD-10-CM

## 2017-01-24 DIAGNOSIS — I5032 Chronic diastolic (congestive) heart failure: Secondary | ICD-10-CM

## 2017-01-24 LAB — ECHOCARDIOGRAM COMPLETE
E decel time: 173 msec
E/e' ratio: 10.78
FS: 31 % (ref 28–44)
Height: 63 in
IVS/LV PW RATIO, ED: 0.95
LA ID, A-P, ES: 34 mm
LA diam end sys: 34 mm
LA diam index: 1.69 cm/m2
LA vol A4C: 44.5 ml
LA vol index: 23.3 mL/m2
LA vol: 46.8 mL
LV E/e' medial: 10.78
LV E/e'average: 10.78
LV PW d: 10.4 mm — AB (ref 0.6–1.1)
LV e' LATERAL: 8.67 cm/s
LVOT SV: 63 mL
LVOT VTI: 24.8 cm
LVOT area: 2.54 cm2
LVOT diameter: 18 mm
LVOT peak grad rest: 5 mmHg
LVOT peak vel: 112 cm/s
MV Dec: 173
MV Peak grad: 3 mmHg
MV pk A vel: 90.3 m/s
MV pk E vel: 93.5 m/s
RV sys press: 35 mmHg
Reg peak vel: 284 cm/s
TAPSE: 25.4 mm
TDI e' lateral: 8.67
TDI e' medial: 6.47
TR max vel: 284 cm/s
Weight: 3485.03 oz

## 2017-01-24 LAB — LIPID PANEL
CHOL/HDL RATIO: 2.5 ratio
CHOLESTEROL: 138 mg/dL (ref 0–200)
HDL: 56 mg/dL (ref >40)
LDL Cholesterol: 64 mg/dL (ref 0–99)
Triglycerides: 89 mg/dL (ref <150)
VLDL: 18 mg/dL (ref 0–40)

## 2017-01-24 LAB — URINALYSIS, ROUTINE W REFLEX MICROSCOPIC
Bilirubin Urine: NEGATIVE
Glucose, UA: NEGATIVE mg/dL
Hgb urine dipstick: NEGATIVE
Ketones, ur: NEGATIVE mg/dL
Leukocytes, UA: NEGATIVE
NITRITE: NEGATIVE
Protein, ur: NEGATIVE mg/dL
SPECIFIC GRAVITY, URINE: 1.01 (ref 1.005–1.030)
pH: 6 (ref 5.0–8.0)

## 2017-01-24 LAB — RAPID URINE DRUG SCREEN, HOSP PERFORMED
Amphetamines: NOT DETECTED
Barbiturates: NOT DETECTED
Benzodiazepines: NOT DETECTED
Cocaine: NOT DETECTED
Opiates: POSITIVE — AB
Tetrahydrocannabinol: NOT DETECTED

## 2017-01-24 LAB — HEMOGLOBIN A1C
Hgb A1c MFr Bld: 5.8 % — ABNORMAL HIGH (ref 4.8–5.6)
MEAN PLASMA GLUCOSE: 119.76 mg/dL

## 2017-01-24 MED ORDER — ENOXAPARIN SODIUM 40 MG/0.4ML ~~LOC~~ SOLN
40.0000 mg | Freq: Every day | SUBCUTANEOUS | Status: DC
  Filled 2017-01-24 (×2): qty 0.4

## 2017-01-24 MED ORDER — ACETAMINOPHEN 160 MG/5ML PO SOLN: 650.0000 mg | ORAL | Status: DC | PRN

## 2017-01-24 MED ORDER — STROKE: EARLY STAGES OF RECOVERY BOOK
Freq: Once | Status: AC
  Administered 2017-01-24: 15:00:00
  Filled 2017-01-24: qty 1

## 2017-01-24 MED ORDER — LINACLOTIDE 145 MCG PO CAPS
145.0000 ug | ORAL_CAPSULE | Freq: Every day | ORAL | Status: DC
  Administered 2017-01-24: 145 ug via ORAL
  Filled 2017-01-24 (×2): qty 1

## 2017-01-24 MED ORDER — ASPIRIN 300 MG RE SUPP: 300.0000 mg | Freq: Every day | RECTAL | Status: DC

## 2017-01-24 MED ORDER — LEVOTHYROXINE SODIUM 137 MCG PO TABS
137.0000 ug | ORAL_TABLET | Freq: Every day | ORAL | Status: DC
  Administered 2017-01-24: 137 ug via ORAL
  Filled 2017-01-24 (×2): qty 1

## 2017-01-24 MED ORDER — ATORVASTATIN CALCIUM 40 MG PO TABS
40.0000 mg | ORAL_TABLET | Freq: Every evening | ORAL | Status: DC
  Filled 2017-01-24: qty 1

## 2017-01-24 MED ORDER — ZOLPIDEM TARTRATE 5 MG PO TABS: 5.0000 mg | ORAL_TABLET | Freq: Every evening | ORAL | Status: DC | PRN

## 2017-01-24 MED ORDER — ASPIRIN 325 MG PO TABS
325.0000 mg | ORAL_TABLET | Freq: Every day | ORAL | 0 refills | Status: DC
Start: 2017-01-24 — End: 2017-04-22

## 2017-01-24 MED ORDER — DOCUSATE SODIUM 100 MG PO CAPS: 100.0000 mg | ORAL_CAPSULE | Freq: Every day | ORAL | Status: DC | PRN

## 2017-01-24 MED ORDER — ACETAMINOPHEN 650 MG RE SUPP: 650.0000 mg | RECTAL | Status: DC | PRN

## 2017-01-24 MED ORDER — OLANZAPINE 10 MG PO TABS
10.0000 mg | ORAL_TABLET | Freq: Every day | ORAL | Status: DC
  Administered 2017-01-24: 10 mg via ORAL
  Filled 2017-01-24: qty 1

## 2017-01-24 MED ORDER — LOXAPINE SUCCINATE 10 MG PO CAPS: 10.0000 mg | ORAL_CAPSULE | Freq: Every day | ORAL | Status: DC

## 2017-01-24 MED ORDER — ASPIRIN 325 MG PO TABS
325.0000 mg | ORAL_TABLET | Freq: Every day | ORAL | Status: DC
  Administered 2017-01-24: 325 mg via ORAL
  Filled 2017-01-24: qty 1

## 2017-01-24 MED ORDER — IOPAMIDOL (ISOVUE-370) INJECTION 76%
INTRAVENOUS | Status: AC
  Filled 2017-01-24: qty 50

## 2017-01-24 MED ORDER — ACETAMINOPHEN 325 MG PO TABS: 650.0000 mg | ORAL_TABLET | ORAL | Status: DC | PRN

## 2017-01-24 MED ORDER — IOPAMIDOL (ISOVUE-370) INJECTION 76%
INTRAVENOUS | Status: AC
  Administered 2017-01-24: 50 mL
  Filled 2017-01-24: qty 100

## 2017-01-24 MED ORDER — CYCLOBENZAPRINE HCL 10 MG PO TABS: 5.0000 mg | ORAL_TABLET | Freq: Three times a day (TID) | ORAL | Status: DC | PRN

## 2017-01-24 NOTE — ED Notes (Signed)
Neuro notified, pt wanting to go home.

## 2017-01-24 NOTE — ED Notes (Signed)
Pt refuses to leave all monitor equipment on (BP, pulse ox, heart monitor)

## 2017-01-24 NOTE — ED Notes (Signed)
Pt. Able to stand from bed and ambulate with steady gait.

## 2017-01-24 NOTE — Progress Notes (Signed)
  Echocardiogram 2D Echocardiogram has been performed.  Kelsey Hanna 01/24/2017, 3:16 PM
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
## 2017-01-24 NOTE — ED Notes (Signed)
Patient transported to CT 

## 2017-01-24 NOTE — ED Notes (Signed)
Pt asking every 15-20 minutes when the doctor is coming to see her-- explained to the pt that dr's would be heree when possible

## 2017-01-24 NOTE — Progress Notes (Signed)
STROKE TEAM PROGRESS NOTE   HISTORY OF PRESENT ILLNESS (per record) Kelsey Hanna is an 66 y.o. female who was brought to the ED via EMS as a Code Stroke. Initial LKN reported as 2000, with patient clarifying in CT that LKN was 1800. She states that first symptoms consisted of slurred speech while speaking on the telephone with her friend. She also noted difficulty ambulating - initially on interview would not specify if this was due to lack of balance, bilateral or unilateral weakness, eventually stating that her left side was weak. Per EMS, her vitals were as follows: BP 107/65, HR 75, SPO2 185, CBG 185.  She does not take an antiplatelet medication and is not on an anticoagulant.   PMHx includes bipolar disorder, uterine cancer s/p TAH/BSO, LBP status post lumbar laminectomy/microdiscectomy, depression, DM, dyslipidemia, hypothyroidism, left retinal detachment and sleep apnea. She has no prior history of stroke.   LSN: 1800 tPA Given: No: Out of time window.      SUBJECTIVE (INTERVAL HISTORY) Her family is not at bedside she feels she can go home   OBJECTIVE Temp:  [97.6 F (36.4 C)-98 F (36.7 C)] 98 F (36.7 C) (12/08 1115) Pulse Rate:  [70-83] 71 (12/08 1430) Resp:  [16-27] 16 (12/08 1346) BP: (102-162)/(59-91) 162/91 (12/08 1346) SpO2:  [89 %-98 %] 90 % (12/08 1430) Weight:  [217 lb 13 oz (98.8 kg)] 217 lb 13 oz (98.8 kg) (12/07 2301)  CBC:  Recent Labs  Lab 01/23/17 2236 01/23/17 2242  WBC 8.7  --   NEUTROABS 4.9  --   HGB 12.8 13.3  HCT 40.6 39.0  MCV 91.0  --   PLT 230  --     Basic Metabolic Panel:  Recent Labs  Lab 01/23/17 2236 01/23/17 2242  NA 141 142  K 4.1 4.1  CL 104 104  CO2 27  --   GLUCOSE 123* 122*  BUN 10 11  CREATININE 0.94 0.80  CALCIUM 9.5  --     Lipid Panel:     Component Value Date/Time   CHOL 138 01/24/2017 0312   TRIG 89 01/24/2017 0312   HDL 56 01/24/2017 0312   CHOLHDL 2.5 01/24/2017 0312   VLDL 18 01/24/2017  0312   LDLCALC 64 01/24/2017 0312   HgbA1c:  Lab Results  Component Value Date   HGBA1C 5.8 (H) 01/24/2017   Urine Drug Screen:     Component Value Date/Time   LABOPIA POSITIVE (A) 01/24/2017 0042   COCAINSCRNUR NONE DETECTED 01/24/2017 0042   LABBENZ NONE DETECTED 01/24/2017 0042   AMPHETMU NONE DETECTED 01/24/2017 0042   THCU NONE DETECTED 01/24/2017 0042   LABBARB NONE DETECTED 01/24/2017 0042    Alcohol Level     Component Value Date/Time   ETH <10 01/23/2017 2234    IMAGING   Mr Kelsey Hanna Head Wo Contrast 01/24/2017  IMPRESSION:   MRI head:  1. No acute intracranial abnormality.  2. Stable moderate chronic microvascular ischemic changes and moderate parenchymal volume loss of the brain.   MRA head:  1. 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  2. 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  3. No additional evidence for stenosis, vessel occlusion, or aneurysm.    Ct Head Code Stroke Wo Contrast 01/23/2017 IMPRESSION:  1. No acute intracranial infarct or other process identified.  2. ASPECTS is 10.  3. Chronic atrophy with moderate chronic small vessel ischemic disease.    CTA Neck  01/24/2017 IMPRESSION: 1. Atherosclerotic changes at the aortic arch and bilateral carotid bifurcations without significant stenoses relative to the more distal vessels. 2. No significant vertebrobasilar disease. 3. Mild degenerative changes in the cervical spine with osseous foraminal narrowing at C4-5, C5-6, and C6-7, right greater than left.    Transthoracic Echocardiogram  01/24/2017 Study Conclusions - Left ventricle: The cavity size was normal. Wall thickness was   increased in a pattern of mild LVH. Systolic function was   vigorous. The estimated ejection fraction was in the range of 65%   to 70%. - Pulmonary arteries: PA peak pressure: 35 mm Hg (S). - Pericardium, extracardiac: A trivial pericardial effusion  was   identified.   Bilateral Carotid Dopplers 01/24/2017 Preliminary report: There is a possible mobile thrombus versus unusual plaque formation noted in the proximal CCA. No significant ICA stenosis noted, bilaterally.  Vertebral artery flow is antegrade.    Called results to Dr. Starla Link       PHYSICAL EXAM Vitals:   01/24/17 1346 01/24/17 1400 01/24/17 1415 01/24/17 1430  BP: (!) 162/91     Pulse: 74 74 70 71  Resp: 16     Temp:      TempSrc:      SpO2: 93% 92% (!) 89% 90%  Weight:      Height:           ASSESSMENT/PLAN Ms. Kelsey Hanna is a 66 y.o. female with history of bipolar disorder, uterine cancer s/p TAH/BSO, LBP status post lumbar laminectomy/microdiscectomy, depression, DM, dyslipidemia, hypothyroidism, left retinal detachment and sleep apnea  presenting with dysarthria and left-sided weakness. She did not receive IV t-PA due to late presentation.  Possible TIA:    Resultant - resolution of deficits.  CT head - No acute intracranial infarct or other process identified.   MRI head - No acute intracranial abnormality.   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 See details above.  Carotid Doppler -   CT angiogram neck - no significant stenosis  2D Echo - unremarkable  LDL - 64  HgbA1c - 5.8  VTE prophylaxis - Lovenox  Diet Heart Room service appropriate? Yes; Fluid consistency: Thin  Diet - low sodium heart healthy  No antithrombotic prior to admission, now on aspirin 325 mg daily  Patient counseled to be compliant with her antithrombotic medications  Ongoing aggressive stroke risk factor management  Therapy recommendations:  Resolution of deficits  Disposition:  Discharged to home  Hypertension  Stable  Permissive hypertension (OK if < 220/120) but gradually normalize in 5-7 days  Long-term BP goal normotensive  Hyperlipidemia  Home meds:  Lipitor 40 mg daily resumed in hospital  LDL 64, goal <  70  Continue statin at discharge  Diabetes  HgbA1c 5.8, goal < 7.0  Controlled  Other Stroke Risk Factors  Advanced age  Cigarette smoker - advised to stop smoking  Obesity, Body mass index is 38.58 kg/m., recommend weight loss, diet and exercise as appropriate   Obstructive sleep apnea  Other Active Problems   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 Estanislado Pandy.   Plan / Recommendations  Aspirin 325 mg daily  Continued Lipitor  Follow-up at [facility] day # 0 Personally  participated in, made any corrections needed, and agree with history, physical, neuro exam,assessment and plan as stated above.  Sarina Ill, MD     To contact Stroke Continuity provider, please refer to [email or web address] After hours, contact General Neurology

## 2017-01-24 NOTE — Progress Notes (Signed)
VASCULAR LAB PRELIMINARY  PRELIMINARY  PRELIMINARY  PRELIMINARY  Carotid duplex completed.    Preliminary report: There is a possible mobile thrombus versus unusual plaque formation noted in the proximal CCA. No significant ICA stenosis noted, bilaterally.  Vertebral artery flow is antegrade.    Called results to Dr. Maudry Diego, [facility], RVT 01/24/2017, 1:17 PM

## 2017-01-24 NOTE — ED Notes (Signed)
Pt taken to echo.

## 2017-01-24 NOTE — ED Notes (Signed)
Dr Starla Link called this RN and stated that an abnormality was found on her carotid echo study and that she needs an CT angio. Pt notified.

## 2017-01-24 NOTE — Progress Notes (Signed)
We were contacted by the Emergency Department Nurse who stated the patient does not wish to be admitted to the hospital at this time. Although we have not yet seen the patient from the records it sounds as if she has had a TIA. We would recommend that she stay for admission and a full TIA/stroke workup. If the patient is not willing to stay we recommend follow-up with Dr. Krista Blue at [facility]. The patient should be placed on aspirin 325 mg daily as further TIA/stroke prophylaxis.  Also noted on the patient's MRA were two moderate-sized aneurysms one of which was bilobed on the right ICA paraophthalmic artery and measured approximately 9 mm. The aneurysms appear stable, but we discussed this with Dr. Estanislado Pandy who reviewed the patient's images. He recommends a cerebral angiogram for further evaluation and feels that the aneurysms should be treated at some point in the near future to avoid possible rupture which would be devastating to the patient who may not survive such an event. The patient could arrange an outpatient consultation with Dr. Estanislado Pandy by calling [phone] or [phone].  If the patient decides on admission we will proceed with further workup and Dr. Estanislado Pandy would no doubt see the patient in consultation while in the hospital.  Mikey Bussing PA-C [facility] Pager [phone] 01/24/2017, 10:12 AM   Addendum - Carotid duplex - Preliminary report: There is a possible mobile thrombus versus unusual plaque formation noted in the proximal CCA. No significant ICA stenosis noted, bilaterally.  Vertebral artery flow is antegrade.      Spoke with Dr Starla Link - CT angio of neck has been ordered stat. Pt may need anticoagulation. Dr Jaynee Eagles aware.  Mikey Bussing PA-C [facility] Pager [phone] 01/24/2017, 2:04 PM

## 2017-01-24 NOTE — ED Notes (Signed)
Admitting at bedside 

## 2017-01-24 NOTE — ED Notes (Signed)
PT. To MRI via stretcher.

## 2017-01-24 NOTE — H&P (Signed)
History and Physical    Kelsey Hanna YBO:[id number] DOB: 1950-10-23 DOA: 01/23/2017  PCP: Golden Circle, FNP   Patient coming from: Home  Chief Complaint: Left-sided weakness, speech difficulty   HPI: Kelsey Hanna is a 66 y.o. female with medical history significant for bipolar disorder, hypothyroidism, chronic diastolic CHF, and one prior episode of atrial fibrillation with spontaneous conversion to sinus rhythm, now presenting to the emergency department for evaluation of acute left-sided weakness and speech difficulty.  Patient reports that she was in her usual state of health until approximately 18:00, when she started on the phone with a friend and noted difficulty with her speech.  She then experienced difficulty ambulating secondary to left-sided weakness.  EMS was called a few hours later.  Denies any recent fall or trauma and denies any use of substances or alcohol.  ED Course: Upon arrival to the ED, patient is found to be afebrile, saturating well on room air, and with vitals otherwise stable.  EKG features a sinus rhythm since CT is negative for acute intracranial abnormality.  Chemistry panel and CBC are unremarkable.  Ethanol level is undetectable, INR is undetectable, urinalysis is unremarkable, and INR is normal.  Neurology was consulted by the ED physician and a medical admission has been recommended for further evaluation and management of acute left-sided weakness reported speech difficulty, concerning for CVA.  Review of Systems:  All other systems reviewed and apart from HPI, are negative.  Past Medical History:  Diagnosis Date  . Bipolar 1 disorder ([place])   . Cancer [facility])    cancer of uterus...no chemo or radiation  . Chicken pox   . Depression   . Diabetes mellitus    dx within last yr....just takes pills  . Dyslipidemia   . Hypothyroid   . Retinal detachment   . Sleep apnea     Past Surgical History:  Procedure Laterality Date  . ABDOMINAL  HYSTERECTOMY    . LUMBAR LAMINECTOMY/DECOMPRESSION MICRODISCECTOMY  07/17/2011   Procedure: LUMBAR LAMINECTOMY/DECOMPRESSION MICRODISCECTOMY;  Surgeon: Sinclair Ship, MD;  Location: [facility];  Service: Orthopedics;  Laterality: Left;  Left sided lumbar 4-5 microdisectomy  . TONSILLECTOMY    . TOTAL ABDOMINAL HYSTERECTOMY W/ BILATERAL SALPINGOOPHORECTOMY       reports that she has been smoking cigarettes.  She has a 8.75 pack-year smoking history. she has never used smokeless tobacco. She reports that she does not drink alcohol or use drugs.  No Known Allergies  Family History  Problem Relation Age of Onset  . Healthy Mother   . Healthy Father      Prior to Admission medications   Medication Sig Start Date End Date Taking? Authorizing Provider  atorvastatin (LIPITOR) 40 MG tablet TAKE 1 TABLET BY MOUTH IN THE EVENING 10/27/16   Golden Circle, FNP  cyclobenzaprine (FLEXERIL) 5 MG tablet TAKE 1 TABLET BY MOUTH THREE TIMES A DAY AS NEEDED FOR MUSCLE SPASM 06/24/16   Golden Circle, FNP  DOCQLACE 100 MG capsule TAKE 1 TO 2 CAPSULES BY MOUTH TWICE A DAY AS NEEDED FOR MILD CONSTIPATION 11/11/16   Golden Circle, FNP  furosemide (LASIX) 40 MG tablet TAKE 1 TABLET (40 MG TOTAL) BY MOUTH DAILY.PRN 11/20/16   Golden Circle, FNP  levothyroxine (SYNTHROID, LEVOTHROID) 137 MCG tablet TAKE 1 TABLET BY MOUTH DAILY BEFORE BREAKFAST 10/24/16   Golden Circle, FNP  LINZESS 145 MCG CAPS capsule TAKE 1 CAPSULE (145 MCG TOTAL) BY MOUTH DAILY BEFORE BREAKFAST. 09/15/16  Golden Circle, FNP  loxapine (LOXITANE) 10 MG capsule Take 10 mg by mouth at bedtime. 10/30/14 10/30/15  [provider]  metoprolol (LOPRESSOR) 50 MG tablet Take 50 mg by mouth daily. 04/25/15   [provider]  naproxen sodium (ANAPROX) 220 MG tablet Take 440 mg by mouth 4 (four) times daily as needed (pain).    [provider]  OLANZapine (ZYPREXA) 10 MG tablet Take 10 mg by mouth daily. 04/27/15    [provider]  polyethylene glycol powder (GLYCOLAX/MIRALAX) powder MIX 17G AS DIRECTED AND DRINK ONCE DAILY 10/30/15   Golden Circle, FNP  varenicline (CHANTIX CONTINUING MONTH PAK) 1 MG tablet Take 1 tablet (1 mg total) by mouth 2 (two) times daily. 11/05/16   Golden Circle, FNP  varenicline (CHANTIX STARTING MONTH PAK) 0.5 MG X 11 & 1 MG X 42 tablet Take one 0.5 mg tablet by mouth once daily for 3 days, then increase to one 0.5 mg tablet twice daily for 4 days, then increase to one 1 mg tablet twice daily. 11/05/16   Golden Circle, FNP  zolpidem (AMBIEN) 5 MG tablet TAKE 1 TABLET BY MOUTH AT BEDTIME AS NEEDED FOR SLEEP 10/24/16   Golden Circle, FNP    Physical Exam: Vitals:   01/23/17 2301 01/23/17 2308 01/23/17 2312 01/23/17 2351  BP:  (!) 102/59    Pulse:  70    Resp:  (!) 21    Temp:   97.9 F (36.6 C) 97.6 F (36.4 C)  TempSrc:   Oral   SpO2:  98%    Weight: 98.8 kg (217 lb 13 oz)     Height: 5\' 3"  (1.6 m)         Constitutional: NAD, calm, comfortable Eyes: lids normal. Conjunctivae normal ENMT: Mucous membranes are moist. Posterior pharynx clear of any exudate or lesions.   Neck: normal, supple, no masses, no thyromegaly Respiratory: clear to auscultation bilaterally, no wheezing, no crackles. Normal respiratory effort.    Cardiovascular: S1 & S2 heard, regular rate and rhythm. Trace pretibial edema bilaterally. No significant JVD. Abdomen: No distension, no tenderness, no masses palpated. Bowel sounds normal.  Musculoskeletal: no clubbing / cyanosis. No joint deformity upper and lower extremities.  Skin: no significant rashes, lesions, ulcers. Warm, dry, well-perfused. Neurologic: no gross facial asymmetry. No dysarthria or aphasia. Sensation intact. Strength 5/5 in both UE's. Strength 5/5 throughout RLE, but 4/5 in distal and proximal LLE.  Psychiatric: Alert and oriented x 3. Calm, cooperative.     Labs on Admission: I have personally reviewed  following labs and imaging studies  CBC: Recent Labs  Lab 01/23/17 2236 01/23/17 2242  WBC 8.7  --   NEUTROABS 4.9  --   HGB 12.8 13.3  HCT 40.6 39.0  MCV 91.0  --   PLT 230  --    Basic Metabolic Panel: Recent Labs  Lab 01/23/17 2236 01/23/17 2242  NA 141 142  K 4.1 4.1  CL 104 104  CO2 27  --   GLUCOSE 123* 122*  BUN 10 11  CREATININE 0.94 0.80  CALCIUM 9.5  --    GFR: Estimated Creatinine Clearance: 76.5 mL/min (by C-G formula based on SCr of 0.8 mg/dL). Liver Function Tests: Recent Labs  Lab 01/23/17 2236  AST 27  ALT 36  ALKPHOS 75  BILITOT 0.5  PROT 6.5  ALBUMIN 3.8   No results for input(s): LIPASE, AMYLASE in the last 168 hours. No results for input(s):  AMMONIA in the last 168 hours. Coagulation Profile: Recent Labs  Lab 01/23/17 2236  INR 1.01   Cardiac Enzymes: No results for input(s): CKTOTAL, CKMB, CKMBINDEX, TROPONINI in the last 168 hours. BNP (last 3 results) No results for input(s): PROBNP in the last 8760 hours. HbA1C: No results for input(s): HGBA1C in the last 72 hours. CBG: Recent Labs  Lab 01/23/17 2317  GLUCAP 99   Lipid Profile: No results for input(s): CHOL, HDL, LDLCALC, TRIG, CHOLHDL, LDLDIRECT in the last 72 hours. Thyroid Function Tests: No results for input(s): TSH, T4TOTAL, FREET4, T3FREE, THYROIDAB in the last 72 hours. Anemia Panel: No results for input(s): VITAMINB12, FOLATE, FERRITIN, TIBC, IRON, RETICCTPCT in the last 72 hours. Urine analysis:    Component Value Date/Time   COLORURINE YELLOW 01/24/2017 0042   APPEARANCEUR CLEAR 01/24/2017 0042   LABSPEC 1.010 01/24/2017 0042   PHURINE 6.0 01/24/2017 0042   GLUCOSEU NEGATIVE 01/24/2017 0042   HGBUR NEGATIVE 01/24/2017 0042   BILIRUBINUR NEGATIVE 01/24/2017 0042   KETONESUR NEGATIVE 01/24/2017 0042   PROTEINUR NEGATIVE 01/24/2017 0042   UROBILINOGEN 0.2 10/20/2014 0814   NITRITE NEGATIVE 01/24/2017 0042   LEUKOCYTESUR NEGATIVE 01/24/2017 0042   Sepsis  Labs: @LABRCNTIP (procalcitonin:4,lacticidven:4) )No results found for this or any previous visit (from the past 240 hour(s)).   Radiological Exams on Admission: Mr Virgel Paling NT Contrast  Result Date: 01/24/2017 CLINICAL DATA:  66 y/o  F; stroke for follow-up. EXAM: MRI HEAD WITHOUT CONTRAST MRA HEAD WITHOUT CONTRAST TECHNIQUE: Multiplanar, multiecho pulse sequences of the brain and surrounding structures were obtained without intravenous contrast. Angiographic images of the head were obtained using MRA technique without contrast. COMPARISON:  01/23/2017 CT head. 02/05/2015 MRI head. 01/30/2008 MRA head. FINDINGS: MRI HEAD FINDINGS Brain: No acute infarction, hemorrhage, hydrocephalus, extra-axial collection or mass lesion. Several stable nonspecific foci of T2 FLAIR hyperintense signal abnormality in subcortical and periventricular white matter are compatible with moderate chronic microvascular ischemic changes for age. Moderate brain parenchymal volume loss. Cavum septum pellucidum. Vascular: As below. Skull and upper cervical spine: Normal marrow signal. Sinuses/Orbits: Negative.  Bilateral intra-ocular lens replacement. Other: Stable small volume of T2 hyperintense signal surrounding the right transverse and sigmoid sinus with plate and sinus flow-void, probably arachnoid granulation and/or hygroma. MRA HEAD FINDINGS Internal carotid arteries: Patent. 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 Stable 5 x 9 mm right paraophthalmic segment bilobed and bilaterally directed aneurysm (series 4, image 97). Anterior cerebral arteries:  Patent. Middle cerebral arteries: Patent. Anterior communicating artery: Patent. Large left A1, small right A1, and anterior communicating artery, normal variant. Posterior communicating arteries: Right fetal PCA. No left posterior communicating artery identified, likely  hypoplastic or absent. Posterior cerebral arteries:  Patent. Basilar artery:  Patent. Vertebral arteries:  Patent. No additional evidence of high-grade stenosis, large vessel occlusion, or aneurysm unless noted above. IMPRESSION: MRI head: 1. No acute intracranial abnormality. 2. Stable moderate chronic microvascular ischemic changes and moderate parenchymal volume loss of the brain. MRA head: 1. 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 2. 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 3. No additional evidence for stenosis, vessel occlusion, or aneurysm. Electronically Signed   By: Kristine Garbe M.D.   On: 01/24/2017 01:56   Mr Brain Wo Contrast  Result Date: 01/24/2017 CLINICAL DATA:  66 y/o  F; stroke for follow-up. EXAM: MRI HEAD WITHOUT CONTRAST MRA  HEAD WITHOUT CONTRAST TECHNIQUE: Multiplanar, multiecho pulse sequences of the brain and surrounding structures were obtained without intravenous contrast. Angiographic images of the head were obtained using MRA technique without contrast. COMPARISON:  01/23/2017 CT head. 02/05/2015 MRI head. 01/30/2008 MRA head. FINDINGS: MRI HEAD FINDINGS Brain: No acute infarction, hemorrhage, hydrocephalus, extra-axial collection or mass lesion. Several stable nonspecific foci of T2 FLAIR hyperintense signal abnormality in subcortical and periventricular white matter are compatible with moderate chronic microvascular ischemic changes for age. Moderate brain parenchymal volume loss. Cavum septum pellucidum. Vascular: As below. Skull and upper cervical spine: Normal marrow signal. Sinuses/Orbits: Negative.  Bilateral intra-ocular lens replacement. Other: Stable small volume of T2 hyperintense signal surrounding the right transverse and sigmoid sinus with plate and sinus flow-void, probably arachnoid granulation and/or hygroma. MRA HEAD FINDINGS Internal carotid arteries: Patent. 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 Stable 5 x 9 mm right paraophthalmic segment bilobed and bilaterally directed aneurysm (series 4, image 97). Anterior cerebral arteries:  Patent. Middle cerebral arteries: Patent. Anterior communicating artery: Patent. Large left A1, small right A1, and anterior communicating artery, normal variant. Posterior communicating arteries: Right fetal PCA. No left posterior communicating artery identified, likely hypoplastic or absent. Posterior cerebral arteries:  Patent. Basilar artery:  Patent. Vertebral arteries:  Patent. No additional evidence of high-grade stenosis, large vessel occlusion, or aneurysm unless noted above. IMPRESSION: MRI head: 1. No acute intracranial abnormality. 2. Stable moderate chronic microvascular ischemic changes and moderate parenchymal volume loss of the brain. MRA head: 1. 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 2. 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 3. No additional evidence for stenosis, vessel occlusion, or aneurysm. Electronically Signed   By: Kristine Garbe M.D.   On: 01/24/2017 01:56   Ct Head Code Stroke Wo Contrast  Result Date: 01/23/2017 CLINICAL DATA:  Code stroke. Initial evaluation for acute gait abnormality, slurred speech. EXAM: CT HEAD WITHOUT CONTRAST TECHNIQUE: Contiguous axial images were obtained from the base of the skull through the vertex without intravenous contrast. COMPARISON:  Prior MRI from 02/05/2015. FINDINGS: Brain: Moderate atrophy with chronic small vessel ischemic disease. No acute intracranial hemorrhage. No findings to suggest acute large vessel territory infarct. No mass lesion, midline shift or mass effect. No hydrocephalus. No extra-axial fluid collection. Vascular: No hyperdense vessel. Scattered vascular calcifications noted within the  carotid siphons. Skull: Scalp soft tissues and calvarium within normal limits. Sinuses/Orbits: Globes and orbital soft tissues within normal limits. Patient status post lens extraction bilaterally. Paranasal sinuses and mastoid air cells are clear. Other: None. ASPECTS [facility] Stroke Program Early CT Score) - Ganglionic level infarction (caudate, lentiform nuclei, internal capsule, insula, M1-M3 cortex): 7 - Supraganglionic infarction (M4-M6 cortex): 3 Total score (0-10 with 10 being normal): 10 IMPRESSION: 1. No acute intracranial infarct or other process identified. 2. ASPECTS is 10. 3. Chronic atrophy with moderate chronic small vessel ischemic disease. Critical Value/emergent results were called by telephone at the time of interpretation on 01/23/2017 at 11:02 pm to Dr. Cheral Marker, who verbally acknowledged these results. Electronically Signed   By: Jeannine Boga M.D.   On: 01/23/2017 23:03    EKG: Independently reviewed. Sinus rhythm, RSR' in V1.   Assessment/Plan  1. Acute left-sided weakness  - Pt presents with acute left-sided weakness, is beyond the time-frame for consideration of tPA  - Head CT is negative for acute intracranial  abnormality  - Neurology is consulting and much appreciated  - Plan to continue cardiac monitoring and frequent neuro checks, request PT/OT/SLP evals, obtain MRI brain, MRA head, echocardiogram, carotid dopplers, A1c, fasting lipids, and start ASA ppx    2. Hypertension  - BP at goal  - Metoprolol held while evaluating of possible acute ischemic infarct    3. Bipolar disorder  - Appears to be stable  - Continue Zyprexa and loxapine    4. Hypothyroidism  - Continue Synthroid    5. Paroxysmal atrial fibrillation  - Had one episode of atrial fibrillation in the setting of serious illness and spontaneous converted to SR  - She was followed by cardiology, not started on [facility], and has not had recurrence  - Metoprolol held as described above     DVT  prophylaxis: Lovenox Code Status: DNR Family Communication: Discussed with patient Disposition Plan: Observe on telemetry Consults called: Neurology Admission status: Observation    Vianne Bulls, MD [facility] Pager [phone]  If 7PM-7AM, please contact night-coverage [email or web address] Password [email or web address]  01/24/2017, 2:04 AM

## 2017-01-24 NOTE — ED Notes (Signed)
Pt ambulated to the restroom.

## 2017-01-24 NOTE — ED Notes (Signed)
Echo in room

## 2017-01-24 NOTE — Progress Notes (Signed)
No mobile thrombus noted on CTA. Reviewed images and report.  No need to start heparin drip.

## 2017-01-24 NOTE — ED Notes (Signed)
Admitting MD paged to [facility] RN @ [phone] to [phone].

## 2017-01-24 NOTE — ED Notes (Signed)
JUST spoke with Dr. Theda Belfast and was advised test all test were negative and pt was able to go home and follow up with Dr. that he had placed on discharge papers. Floor made aware that patient will not be admitted

## 2017-01-24 NOTE — ED Notes (Signed)
Admitting MD came to bedside. He stated that pt can go home after the echo is completed. He is writing d/c orders and pt can go home after that. Pt informed.

## 2017-01-24 NOTE — Evaluation (Signed)
Physical Therapy Evaluation & Discharge Patient Details Name: Kelsey Hanna MRN: [id number] DOB: January 04, 1951 Today's Date: 01/24/2017   History of Present Illness  Kelsey Hanna is a 66 y.o. female with medical history significant for bipolar disorder, hypothyroidism, chronic diastolic CHF, and one prior episode of atrial fibrillation with spontaneous conversion to sinus rhythm, now presenting to the emergency department for evaluation of acute left-sided weakness and speech difficulty. MRI and CT both negative for any acute findings.   Clinical Impression  Pt presented supine in bed with HOB elevated, awake and willing to participate in therapy session. Prior to admission, pt reported that she was independent with all functional mobility and ADLs. Pt lives alone and has a CNA for two hours every week day. Pt ambulated a short distance in hallway with supervision for safety without use of an AD. Pt with mild instability and shuffling gait pattern but no overt LOB or need for physical assistance. Pt would benefit from further PT services and therefore recommending HHPT for f/u upon d/c. No further acute PT needs identified at this time. PT signing off.     Follow Up Recommendations Home health PT;Supervision - Intermittent    Equipment Recommendations  None recommended by PT    Recommendations for Other Services       Precautions / Restrictions Precautions Precautions: Fall Precaution Comments: wears eye patch on L side Restrictions Weight Bearing Restrictions: No      Mobility  Bed Mobility Overal bed mobility: Independent             General bed mobility comments: Crawls into bed and turns around  Transfers Overall transfer level: Needs assistance Equipment used: None Transfers: Sit to/from Stand Sit to Stand: Supervision         General transfer comment: for safety  Ambulation/Gait Ambulation/Gait assistance: Supervision Ambulation Distance (Feet): 50  Feet(50' x2) Assistive device: None Gait Pattern/deviations: Step-to pattern;Step-through pattern;Decreased step length - right;Decreased step length - left;Decreased stride length;Shuffle Gait velocity: decreased Gait velocity interpretation: Below normal speed for age/gender General Gait Details: pt with mild instability and shuffling gait pattern which she reports is not her baseline; no AD and no need for physical assistance, supervision for safety  Stairs            Wheelchair Mobility    Modified Rankin (Stroke Patients Only)       Balance Overall balance assessment: Needs assistance Sitting-balance support: Feet supported Sitting balance-Leahy Scale: Good     Standing balance support: During functional activity;No upper extremity supported Standing balance-Leahy Scale: Fair                               Pertinent Vitals/Pain Pain Assessment: No/denies pain    Home Living Family/patient expects to be discharged to:: Private residence Living Arrangements: Alone Available Help at Discharge: Personal care attendant(2 hrs M-F) Type of Home: House Home Access: Level entry     Home Layout: One level Home Equipment: Walker - 2 wheels;Cane - single point      Prior Function Level of Independence: Independent               Hand Dominance   Dominant Hand: Right    Extremity/Trunk Assessment   Upper Extremity Assessment Upper Extremity Assessment: Defer to OT evaluation    Lower Extremity Assessment Lower Extremity Assessment: Generalized weakness       Communication   Communication: No difficulties  Cognition  Arousal/Alertness: Awake/alert Behavior During Therapy: WFL for tasks assessed/performed Overall Cognitive Status: Within Functional Limits for tasks assessed                                        General Comments General comments (skin integrity, edema, etc.): reported she got a little dizzy when asked to  turn in hallway, but not loss of balance but she did slow down. Pt wtih shuffle gait which she reports is new since the last couple of days. Says she has a RW but will not use it due to does not want to get dependent on it    Exercises     Assessment/Plan    PT Assessment All further PT needs can be met in the next venue of care  PT Problem List Decreased strength;Decreased balance;Decreased mobility;Decreased coordination;Decreased knowledge of use of DME;Decreased safety awareness       PT Treatment Interventions      PT Goals (Current goals can be found in the Care Plan section)  Acute Rehab PT Goals Patient Stated Goal: return home    Frequency     Barriers to discharge        Co-evaluation PT/OT/SLP Co-Evaluation/Treatment: Yes Reason for Co-Treatment: To address functional/ADL transfers PT goals addressed during session: Mobility/safety with mobility;Balance;Strengthening/ROM OT goals addressed during session: ADL's and self-care       AM-PAC PT "6 Clicks" Daily Activity  Outcome Measure Difficulty turning over in bed (including adjusting bedclothes, sheets and blankets)?: None Difficulty moving from lying on back to sitting on the side of the bed? : None Difficulty sitting down on and standing up from a chair with arms (e.g., wheelchair, bedside commode, etc,.)?: A Little Help needed moving to and from a bed to chair (including a wheelchair)?: None Help needed walking in hospital room?: None Help needed climbing 3-5 steps with a railing? : A Lot 6 Click Score: 21    End of Session   Activity Tolerance: Patient tolerated treatment well Patient left: in bed;with call bell/phone within reach Nurse Communication: Mobility status PT Visit Diagnosis: Other abnormalities of gait and mobility (R26.89)    Time: 5170-0174 PT Time Calculation (min) (ACUTE ONLY): 15 min   Charges:   PT Evaluation $PT Eval Low Complexity: 1 Low     PT G Codes:   PT G-Codes **NOT  FOR INPATIENT CLASS** Functional Assessment Tool Used: AM-PAC 6 Clicks Basic Mobility;Clinical judgement Functional Limitation: Mobility: Walking and moving around Mobility: Walking and Moving Around Current Status (B4496): At least 20 percent but less than 40 percent impaired, limited or restricted Mobility: Walking and Moving Around Goal Status [phone]): 0 percent impaired, limited or restricted Mobility: Walking and Moving Around Discharge Status [phone]): At least 20 percent but less than 40 percent impaired, limited or restricted    [facility], [place], DPT [place] 01/24/2017, 9:19 AM

## 2017-01-24 NOTE — ED Notes (Signed)
Pt placed in hospital bed for comfort. Updated on bed status and holding in ED

## 2017-01-24 NOTE — Discharge Summary (Addendum)
Physician Discharge Summary  Kelsey Hanna MGQ:[id number] DOB: 12-Oct-1950 DOA: 01/23/2017  PCP: Golden Circle, FNP  Admit date: 01/23/2017 Discharge date: 01/24/2017  Admitted From: Home Disposition:  Home  Recommendations for Outpatient Follow-up:  1. Follow up with PCP in 1 week 2. Follow-up with neurology/Dr. Krista Blue in 1 week 3. Follow-up with intervention radiology/Dr. Estanislado Pandy in 1 week 4. Follow-up with primary care provider regarding echo results   Home Health: No Equipment/Devices: None  Discharge Condition: Stable  CODE STATUS: Full Diet recommendation: Heart Healthy   Brief/Interim Summary: 66 year old female with history of bipolar disorder, hypothyroidism, chronic diastolic CHF, one prior episode of atrial fibrillation with spontaneous conversion to sinus rhythm presented with acute left-sided weakness and speech difficulty. Initially CT scan of the head was negative. Neurology evaluated the patient. MRI of the brain was negative for any acute infarct. MRA of the brain showed stable left ICA and right ICA aneurysms. Patient desperately wants to go home, her symptoms have resolved with no focal neurological deficit. Echo is still pending. Neurology recommends outpatient follow-up with neurology and interventional radiology/Dr. Estanislado Pandy at earliest convenience. Patient does not want to sign out [place]. She'll be discharged home after echo is done, echo report to be followed by primary care provider.  Discharge Diagnoses:  Principal Problem:   Acute left-sided muscle weakness Active Problems:   Type 2 diabetes mellitus (HCC)   Essential hypertension   Hypothyroidism   Bipolar disorder (HCC)   AF (paroxysmal atrial fibrillation) (HCC)   Chronic diastolic CHF (congestive heart failure) ([place])   Stroke (cerebrum) (HCC)   Intracranial aneurysm   1. Acute left-sided weakness  -  probably from TIA. Symptoms have resolved. CT of the brain is negative for  acute stroke. MRI of the brain is negative for acute stroke -Neurology has already evaluated the patient and recommends completion of TIA workup including 2-D echo. Neurology recommends aspirin 325 mg daily. Patient does not want to stay in the hospital any longer but does not want to sign out [place].  - She'll be discharged home after echo is done, echo report to be followed by primary care provider. If symptoms worsen, advised the patient to return back to the emergency room - Neurology recommends outpatient follow-up with neurology and interventional radiology/Dr. Estanislado Pandy at earliest convenience - Continue statin - Patient has tolerated physical therapy.   2. Hypertension  - Stable. Continue Metoprolol  3. Bipolar disorder  - Appears to be stable  - Continue Zyprexa. Outpatient followup with Psychiatry regarding need for Loxapine.  4. Hypothyroidism  - Continue Synthroid    5. Paroxysmal atrial fibrillation  - Had one episode of atrial fibrillation in the setting of serious illness and spontaneous converted to SR  - She was followed by cardiology, not started on [employer] and has not had recurrence  - outpatient followup. Continue Metoprolol.    Discharge Instructions  Discharge Instructions    Ambulatory referral to Interventional Radiology   Complete by:  As directed    Follow up with Dr. Estanislado Pandy at earliest convenience for intracranial aneursyms as per Neurology recommendations   Ambulatory referral to Neurology   Complete by:  As directed    An appointment is requested in approximately: 1 week   Call MD for:  difficulty breathing, headache or visual disturbances   Complete by:  As directed    Call MD for:  extreme fatigue   Complete by:  As directed    Call MD for:  hives   Complete by:  As directed    Call MD for:  persistant dizziness or light-headedness   Complete by:  As directed    Call MD for:  persistant nausea and vomiting   Complete by:  As  directed    Call MD for:  severe uncontrolled pain   Complete by:  As directed    Call MD for:  temperature >100.4   Complete by:  As directed    Diet - low sodium heart healthy   Complete by:  As directed    Increase activity slowly   Complete by:  As directed      Allergies as of 01/24/2017   No Known Allergies     Medication List    STOP taking these medications   loxapine 10 MG capsule Commonly known as:  LOXITANE   varenicline 0.5 MG X 11 & 1 MG X 42 tablet Commonly known as:  CHANTIX STARTING MONTH PAK     TAKE these medications   aspirin 325 MG tablet Take 1 tablet (325 mg total) by mouth daily.   atorvastatin 40 MG tablet Commonly known as:  LIPITOR TAKE 1 TABLET BY MOUTH IN THE EVENING   clonazePAM 0.5 MG tablet Commonly known as:  KLONOPIN Take 0.5 mg by mouth 2 (two) times daily.   DOCQLACE 100 MG capsule Generic drug:  docusate sodium TAKE 1 TO 2 CAPSULES BY MOUTH TWICE A DAY AS NEEDED FOR MILD CONSTIPATION   gabapentin 100 MG capsule Commonly known as:  NEURONTIN Take 100 mg by mouth 3 (three) times daily.   levothyroxine 137 MCG tablet Commonly known as:  SYNTHROID, LEVOTHROID TAKE 1 TABLET BY MOUTH DAILY BEFORE BREAKFAST   LINZESS 145 MCG Caps capsule Generic drug:  linaclotide TAKE 1 CAPSULE (145 MCG TOTAL) BY MOUTH DAILY BEFORE BREAKFAST.   OLANZapine 10 MG tablet Commonly known as:  ZYPREXA Take 20 mg by mouth at bedtime.   Oxycodone HCl 10 MG Tabs Take 10 mg by mouth every 6 (six) hours as needed (pain).   tiZANidine 4 MG tablet Commonly known as:  ZANAFLEX Take 4 mg by mouth every 8 (eight) hours as needed for muscle spasms.        Follow-up Information    Golden Circle, FNP. Schedule an appointment as soon as possible for a visit in 1 week(s).   Specialties:  Family Medicine, Infectious Diseases Contact information: [address] [place] [address] [phone]        Marcial Pacas, MD. Schedule an  appointment as soon as possible for a visit in 1 week(s).   Specialty:  Neurology Why:  call to make appointment at earliest convenience Contact information: [place] [address] [phone]        Luanne Bras, MD. Schedule an appointment as soon as possible for a visit in 1 week(s).   Specialties:  Interventional Radiology, Radiology Why:  call to make appointment at earliest convenience Contact information: [address] [place] [address] [phone]          No Known Allergies  Consultations:  Neurology   Procedures/Studies: Mr Virgel Paling JS Contrast  Result Date: 01/24/2017 CLINICAL DATA:  66 y/o  F; stroke for follow-up. EXAM: MRI HEAD WITHOUT CONTRAST MRA HEAD WITHOUT CONTRAST TECHNIQUE: Multiplanar, multiecho pulse sequences of the brain and surrounding structures were obtained without intravenous contrast. Angiographic images of the head were obtained using MRA technique without  contrast. COMPARISON:  01/23/2017 CT head. 02/05/2015 MRI head. 01/30/2008 MRA head. FINDINGS: MRI HEAD FINDINGS Brain: No acute infarction, hemorrhage, hydrocephalus, extra-axial collection or mass lesion. Several stable nonspecific foci of T2 FLAIR hyperintense signal abnormality in subcortical and periventricular white matter are compatible with moderate chronic microvascular ischemic changes for age. Moderate brain parenchymal volume loss. Cavum septum pellucidum. Vascular: As below. Skull and upper cervical spine: Normal marrow signal. Sinuses/Orbits: Negative.  Bilateral intra-ocular lens replacement. Other: Stable small volume of T2 hyperintense signal surrounding the right transverse and sigmoid sinus with plate and sinus flow-void, probably arachnoid granulation and/or hygroma. MRA HEAD FINDINGS Internal carotid arteries: Patent. 5 mm thin neck medially directed saccular aneurysm of left ICA distal cavernous/ophthalmic segment junction "cave"  segment is stable (series 4, image 99). Stable 5 x 9 mm right paraophthalmic segment bilobed and bilaterally directed aneurysm (series 4, image 97). Anterior cerebral arteries:  Patent. Middle cerebral arteries: Patent. Anterior communicating artery: Patent. Large left A1, small right A1, and anterior communicating artery, normal variant. Posterior communicating arteries: Right fetal PCA. No left posterior communicating artery identified, likely hypoplastic or absent. Posterior cerebral arteries:  Patent. Basilar artery:  Patent. Vertebral arteries:  Patent. No additional evidence of high-grade stenosis, large vessel occlusion, or aneurysm unless noted above. IMPRESSION: MRI head: 1. No acute intracranial abnormality. 2. Stable moderate chronic microvascular ischemic changes and moderate parenchymal volume loss of the brain. MRA head: 1. 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 2. 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 3. No additional evidence for stenosis, vessel occlusion, or aneurysm. Electronically Signed   By: Kristine Garbe M.D.   On: 01/24/2017 01:56   Mr Brain Wo Contrast  Result Date: 01/24/2017 CLINICAL DATA:  66 y/o  F; stroke for follow-up. EXAM: MRI HEAD WITHOUT CONTRAST MRA HEAD WITHOUT CONTRAST TECHNIQUE: Multiplanar, multiecho pulse sequences of the brain and surrounding structures were obtained without intravenous contrast. Angiographic images of the head were obtained using MRA technique without contrast. COMPARISON:  01/23/2017 CT head. 02/05/2015 MRI head. 01/30/2008 MRA head. FINDINGS: MRI HEAD FINDINGS Brain: No acute infarction, hemorrhage, hydrocephalus, extra-axial collection or mass lesion. Several stable nonspecific foci of T2 FLAIR hyperintense signal abnormality in subcortical and periventricular white matter are compatible with moderate chronic microvascular ischemic changes for age. Moderate brain  parenchymal volume loss. Cavum septum pellucidum. Vascular: As below. Skull and upper cervical spine: Normal marrow signal. Sinuses/Orbits: Negative.  Bilateral intra-ocular lens replacement. Other: Stable small volume of T2 hyperintense signal surrounding the right transverse and sigmoid sinus with plate and sinus flow-void, probably arachnoid granulation and/or hygroma. MRA HEAD FINDINGS Internal carotid arteries: Patent. 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 Stable 5 x 9 mm right paraophthalmic segment bilobed and bilaterally directed aneurysm (series 4, image 97). Anterior cerebral arteries:  Patent. Middle cerebral arteries: Patent. Anterior communicating artery: Patent. Large left A1, small right A1, and anterior communicating artery, normal variant. Posterior communicating arteries: Right fetal PCA. No left posterior communicating artery identified, likely hypoplastic or absent. Posterior cerebral arteries:  Patent. Basilar artery:  Patent. Vertebral arteries:  Patent. No additional evidence of high-grade stenosis, large vessel occlusion, or aneurysm unless noted above. IMPRESSION: MRI head: 1. No acute intracranial abnormality. 2. Stable moderate chronic microvascular ischemic changes and moderate parenchymal volume loss of the brain. MRA head: 1. Stable 5 mm left ICA distal cavernous/ophthalmic  segment junction saccular aneurysm. 2. 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 3. No additional evidence for stenosis, vessel occlusion, or aneurysm. Electronically Signed   By: Kristine Garbe M.D.   On: 01/24/2017 01:56   Ct Head Code Stroke Wo Contrast  Result Date: 01/23/2017 CLINICAL DATA:  Code stroke. Initial evaluation for acute gait abnormality, slurred speech. EXAM: CT HEAD WITHOUT CONTRAST TECHNIQUE: Contiguous axial images were obtained from the base of  the skull through the vertex without intravenous contrast. COMPARISON:  Prior MRI from 02/05/2015. FINDINGS: Brain: Moderate atrophy with chronic small vessel ischemic disease. No acute intracranial hemorrhage. No findings to suggest acute large vessel territory infarct. No mass lesion, midline shift or mass effect. No hydrocephalus. No extra-axial fluid collection. Vascular: No hyperdense vessel. Scattered vascular calcifications noted within the carotid siphons. Skull: Scalp soft tissues and calvarium within normal limits. Sinuses/Orbits: Globes and orbital soft tissues within normal limits. Patient status post lens extraction bilaterally. Paranasal sinuses and mastoid air cells are clear. Other: None. ASPECTS [facility] Stroke Program Early CT Score) - Ganglionic level infarction (caudate, lentiform nuclei, internal capsule, insula, M1-M3 cortex): 7 - Supraganglionic infarction (M4-M6 cortex): 3 Total score (0-10 with 10 being normal): 10 IMPRESSION: 1. No acute intracranial infarct or other process identified. 2. ASPECTS is 10. 3. Chronic atrophy with moderate chronic small vessel ischemic disease. Critical Value/emergent results were called by telephone at the time of interpretation on 01/23/2017 at 11:02 pm to Dr. Cheral Marker, who verbally acknowledged these results. Electronically Signed   By: Jeannine Boga M.D.   On: 01/23/2017 23:03    Echo pending   Subjective: Patient seen and examined at bedside. She feels much better and states that her weakness and speech have improved. She wants to go home. No current chest pain or shortness of breath  Discharge Exam: Vitals:   01/23/17 2351 01/24/17 0321  BP:  (!) 158/78  Pulse:  83  Resp:  (!) 27  Temp: 97.6 F (36.4 C) 97.8 F (36.6 C)  SpO2:  95%   Vitals:   01/23/17 2308 01/23/17 2312 01/23/17 2351 01/24/17 0321  BP: (!) 102/59   (!) 158/78  Pulse: 70   83  Resp: (!) 21   (!) 27  Temp:  97.9 F (36.6 C) 97.6 F (36.4 C) 97.8 F (36.6  C)  TempSrc:  Oral  Oral  SpO2: 98%   95%  Weight:      Height:        General: Pt is alert, awake, not in acute distress Cardiovascular: Rate controlled, S1/S2 + Respiratory: Bilateral decreased breath sounds bases Abdominal: Soft, NT, ND, bowel sounds + Extremities: no edema, no cyanosis    The results of significant diagnostics from this hospitalization (including imaging, microbiology, ancillary and laboratory) are listed below for reference.     Microbiology: No results found for this or any previous visit (from the past 240 hour(s)).   Labs: BNP (last 3 results) No results for input(s): BNP in the last 8760 hours. Basic Metabolic Panel: Recent Labs  Lab 01/23/17 2236 01/23/17 2242  NA 141 142  K 4.1 4.1  CL 104 104  CO2 27  --   GLUCOSE 123* 122*  BUN 10 11  CREATININE 0.94 0.80  CALCIUM 9.5  --    Liver Function Tests: Recent Labs  Lab 01/23/17 2236  AST 27  ALT 36  ALKPHOS 75  BILITOT 0.5  PROT 6.5  ALBUMIN 3.8   No results for  input(s): LIPASE, AMYLASE in the last 168 hours. No results for input(s): AMMONIA in the last 168 hours. CBC: Recent Labs  Lab 01/23/17 2236 01/23/17 2242  WBC 8.7  --   NEUTROABS 4.9  --   HGB 12.8 13.3  HCT 40.6 39.0  MCV 91.0  --   PLT 230  --    Cardiac Enzymes: No results for input(s): CKTOTAL, CKMB, CKMBINDEX, TROPONINI in the last 168 hours. BNP: Invalid input(s): POCBNP CBG: Recent Labs  Lab 01/23/17 2317  GLUCAP 99   D-Dimer No results for input(s): DDIMER in the last 72 hours. Hgb A1c Recent Labs    01/24/17 0312  HGBA1C 5.8*   Lipid Profile Recent Labs    01/24/17 0312  CHOL 138  HDL 56  LDLCALC 64  TRIG 89  CHOLHDL 2.5   Thyroid function studies No results for input(s): TSH, T4TOTAL, T3FREE, THYROIDAB in the last 72 hours.  Invalid input(s): FREET3 Anemia work up No results for input(s): VITAMINB12, FOLATE, FERRITIN, TIBC, IRON, RETICCTPCT in the last 72 hours. Urinalysis     Component Value Date/Time   COLORURINE YELLOW 01/24/2017 0042   APPEARANCEUR CLEAR 01/24/2017 0042   LABSPEC 1.010 01/24/2017 0042   PHURINE 6.0 01/24/2017 0042   GLUCOSEU NEGATIVE 01/24/2017 0042   HGBUR NEGATIVE 01/24/2017 0042   BILIRUBINUR NEGATIVE 01/24/2017 0042   KETONESUR NEGATIVE 01/24/2017 0042   PROTEINUR NEGATIVE 01/24/2017 0042   UROBILINOGEN 0.2 10/20/2014 0814   NITRITE NEGATIVE 01/24/2017 0042   LEUKOCYTESUR NEGATIVE 01/24/2017 0042   Sepsis Labs Invalid input(s): PROCALCITONIN,  WBC,  LACTICIDVEN Microbiology No results found for this or any previous visit (from the past 240 hour(s)).   Time coordinating discharge: 30 minutes  SIGNED:   Aline August, MD  [facility] 01/24/2017, 12:20 PM Pager: [phone]  If 7PM-7AM, please contact night-coverage [email or web address] Password [email or web address]

## 2017-01-24 NOTE — ED Notes (Signed)
Pt back from restroom and echo back in progress.

## 2017-01-24 NOTE — ED Notes (Signed)
Pt calling out wants to know when she can go home.

## 2017-01-27 ENCOUNTER — Telehealth (HOSPITAL_COMMUNITY): Payer: Self-pay

## 2017-01-27 NOTE — Telephone Encounter (Signed)
Called to schedule consult, left message for pt to return call. AW 

## 2017-01-29 ENCOUNTER — Telehealth: Payer: Self-pay | Admitting: Nurse Practitioner

## 2017-01-29 NOTE — Telephone Encounter (Signed)
Pt asking for refill of Ambien, which is not seen on current med list. Former pt of [employer].

## 2017-01-29 NOTE — Telephone Encounter (Signed)
Copied from [place]. Topic: Quick Communication - See Telephone Encounter >> Jan 29, 2017  4:28 PM Bea Graff, NT wrote: CRM for notification. See Telephone encounter for: Pt wanting a refill request of Ambien sent to [facility]. Please call pt/ Former pt of Dr. Elna Breslow  01/29/17.

## 2017-01-30 ENCOUNTER — Other Ambulatory Visit (HOSPITAL_COMMUNITY): Payer: Self-pay | Admitting: Interventional Radiology

## 2017-01-30 DIAGNOSIS — I729 Aneurysm of unspecified site: Secondary | ICD-10-CM

## 2017-01-30 NOTE — Telephone Encounter (Signed)
Patient will wait until Kelsey Hanna and will change her lifestyle around, she falls asleep at 3am and get 4 hours of sleep.

## 2017-01-30 NOTE — Telephone Encounter (Signed)
Appt made for 03/25/2017,  Patient would like to get a refill to help her sleep Please advise

## 2017-01-30 NOTE — Telephone Encounter (Signed)
No appt w/new provider have been made. Pls make appt and it will be up to the provider discretion if he/she want to refill since med is consider a controlled substance...Johny Chess

## 2017-01-31 DIAGNOSIS — R471 Dysarthria and anarthria: Secondary | ICD-10-CM | POA: Insufficient documentation

## 2017-01-31 DIAGNOSIS — R200 Anesthesia of skin: Secondary | ICD-10-CM | POA: Insufficient documentation

## 2017-02-19 ENCOUNTER — Ambulatory Visit (HOSPITAL_COMMUNITY)
Admission: RE | Admit: 2017-02-19 | Discharge: 2017-02-19 | Disposition: A | Discharge status: Home / Self Care | Payer: Medicare Other | Source: Ambulatory Visit | Attending: Interventional Radiology | Admitting: Interventional Radiology

## 2017-02-19 DIAGNOSIS — I671 Cerebral aneurysm, nonruptured: Secondary | ICD-10-CM | POA: Diagnosis not present

## 2017-02-19 DIAGNOSIS — I729 Aneurysm of unspecified site: Secondary | ICD-10-CM

## 2017-02-19 HISTORY — PX: IR RADIOLOGIST EVAL & MGMT: IMG5224

## 2017-02-20 ENCOUNTER — Other Ambulatory Visit (HOSPITAL_COMMUNITY): Payer: Self-pay | Admitting: Interventional Radiology

## 2017-02-20 DIAGNOSIS — I671 Cerebral aneurysm, nonruptured: Secondary | ICD-10-CM

## 2017-02-23 ENCOUNTER — Telehealth: Payer: Self-pay | Admitting: Nurse Practitioner

## 2017-02-23 ENCOUNTER — Encounter (HOSPITAL_COMMUNITY): Payer: Self-pay | Admitting: Interventional Radiology

## 2017-02-23 MED ORDER — LEVOTHYROXINE SODIUM 137 MCG PO TABS: ORAL_TABLET | ORAL | 0 refills | Status: DC

## 2017-02-23 NOTE — Addendum Note (Signed)
Addended by: Earnstine Regal on: 02/23/2017 10:48 AM   Modules accepted: Orders

## 2017-02-23 NOTE — Telephone Encounter (Signed)
Copied from [place]. Topic: Quick Communication - See Telephone Encounter >> Feb 23, 2017 10:12 AM Bea Graff, NT wrote: CRM for notification. See Telephone encounter for: Pt does not want to wait until February before getting her Ambien refilled. She would like this medication refilled. As well as her synthroid. Uses [facility].  02/23/17.

## 2017-02-23 NOTE — Telephone Encounter (Signed)
Can send 30 day on maintenance med " synthroid", far as for the [place]. See Caryl Pina response from Dec 13th for renewal on the [place] (see below). Pt will have to make appt for med review for the ambien to be refilled.../lmb  Lance Sell, NP  to Me       2:03 PM  I do not see this on her current medication list.  No refill can be provided at this time.  I will be glad to discuss options to improve sleep at our first visit.  If she feels she needs something before then, she can schedule OV with any provider in our clinic

## 2017-02-24 ENCOUNTER — Telehealth: Payer: Self-pay | Admitting: *Deleted

## 2017-02-24 NOTE — Telephone Encounter (Signed)
Called pt to verify msg below. Per chart has not seen anyone at this office since 03/2016. Pt states she was given med by her pain management MD, but she called the pharmacist to ask if she can smoke w/the new medication that was given. Pt states pharmacist inform her she can, but they don't like for you to smoke. Advise pt to contact the prescribing MD to make sure, and also when she come in for her new appt in Feb w/Ashley to make sure she bring a list of her medications or bottles so we can update her medication list!  Copied from [place] [phone]. Topic: General - Other >> Feb 23, 2017  4:52 PM Ahmed Prima L wrote: Pt wants to know if she can smoke or not because she was put on a certain medicine , she does not know the name of it. Said she cant really see the bottle Call back is [phone]

## 2017-02-25 ENCOUNTER — Ambulatory Visit: Payer: Self-pay | Admitting: Family

## 2017-02-26 ENCOUNTER — Telehealth: Payer: Self-pay | Admitting: Nurse Practitioner

## 2017-02-26 NOTE — Telephone Encounter (Signed)
Copied from [place] [phone]. Topic: General - Other >> Feb 26, 2017 12:52 PM Synthia Innocent wrote: Reason for CRM: Requesting approval for more hours with aide, [place]. Having surgery on the 03/11/17 to remove brain aneurysm

## 2017-02-27 ENCOUNTER — Telehealth (HOSPITAL_COMMUNITY): Payer: Self-pay | Admitting: *Deleted

## 2017-02-27 ENCOUNTER — Ambulatory Visit: Payer: Self-pay | Admitting: Family

## 2017-02-27 NOTE — Telephone Encounter (Signed)
Spoke with patient and advised that [facility] states a [facility] form needs to be filled out and faxed for an evaluation. Pt understood. Will work on form to fax over.

## 2017-02-27 NOTE — Telephone Encounter (Signed)
Called and refilled Plavix,  Pt took 7 days of plavix to soon,  She was to start 7days prior to procedure.  Procedure is scheduled for 03/11/17.  Pt is unable to come in for bllod work prior to 03/11/17.

## 2017-03-02 DIAGNOSIS — M545 Low back pain: Secondary | ICD-10-CM | POA: Diagnosis not present

## 2017-03-02 DIAGNOSIS — G894 Chronic pain syndrome: Secondary | ICD-10-CM | POA: Diagnosis not present

## 2017-03-02 DIAGNOSIS — Z79891 Long term (current) use of opiate analgesic: Secondary | ICD-10-CM | POA: Diagnosis not present

## 2017-03-04 ENCOUNTER — Ambulatory Visit (INDEPENDENT_AMBULATORY_CARE_PROVIDER_SITE_OTHER): Payer: Medicare Other | Admitting: Family

## 2017-03-04 ENCOUNTER — Other Ambulatory Visit (HOSPITAL_COMMUNITY)
Admission: RE | Admit: 2017-03-04 | Discharge: 2017-03-04 | Disposition: A | Discharge status: Home / Self Care | Payer: Medicare Other | Source: Ambulatory Visit | Attending: Interventional Radiology | Admitting: Interventional Radiology

## 2017-03-04 ENCOUNTER — Other Ambulatory Visit (HOSPITAL_COMMUNITY): Payer: Self-pay | Admitting: Radiology

## 2017-03-04 ENCOUNTER — Encounter: Payer: Self-pay | Admitting: Family

## 2017-03-04 ENCOUNTER — Other Ambulatory Visit (INDEPENDENT_AMBULATORY_CARE_PROVIDER_SITE_OTHER): Payer: Medicare Other

## 2017-03-04 VITALS — BP 128/74 | HR 80 | Temp 97.8°F | Ht 63.0 in | Wt 201.1 lb

## 2017-03-04 DIAGNOSIS — E039 Hypothyroidism, unspecified: Secondary | ICD-10-CM

## 2017-03-04 DIAGNOSIS — F5104 Psychophysiologic insomnia: Secondary | ICD-10-CM

## 2017-03-04 DIAGNOSIS — M5442 Lumbago with sciatica, left side: Secondary | ICD-10-CM | POA: Diagnosis not present

## 2017-03-04 DIAGNOSIS — F319 Bipolar disorder, unspecified: Secondary | ICD-10-CM | POA: Diagnosis not present

## 2017-03-04 DIAGNOSIS — I5032 Chronic diastolic (congestive) heart failure: Secondary | ICD-10-CM

## 2017-03-04 DIAGNOSIS — E782 Mixed hyperlipidemia: Secondary | ICD-10-CM

## 2017-03-04 DIAGNOSIS — I671 Cerebral aneurysm, nonruptured: Secondary | ICD-10-CM

## 2017-03-04 DIAGNOSIS — G4733 Obstructive sleep apnea (adult) (pediatric): Secondary | ICD-10-CM

## 2017-03-04 DIAGNOSIS — K5900 Constipation, unspecified: Secondary | ICD-10-CM

## 2017-03-04 DIAGNOSIS — G8929 Other chronic pain: Secondary | ICD-10-CM | POA: Diagnosis not present

## 2017-03-04 LAB — COMPREHENSIVE METABOLIC PANEL
ALT: 22 U/L (ref 0–35)
AST: 19 U/L (ref 0–37)
Albumin: 4.4 g/dL (ref 3.5–5.2)
Alkaline Phosphatase: 71 U/L (ref 39–117)
BILIRUBIN TOTAL: 0.4 mg/dL (ref 0.2–1.2)
BUN: 12 mg/dL (ref 6–23)
CALCIUM: 9.5 mg/dL (ref 8.4–10.5)
CO2: 26 meq/L (ref 19–32)
CREATININE: 0.82 mg/dL (ref 0.40–1.20)
Chloride: 105 mEq/L (ref 96–112)
GFR: 73.69 mL/min (ref >60.00)
Glucose, Bld: 87 mg/dL (ref 70–99)
Potassium: 4.3 mEq/L (ref 3.5–5.1)
Sodium: 140 mEq/L (ref 135–145)
Total Protein: 7.3 g/dL (ref 6.0–8.3)

## 2017-03-04 LAB — LIPID PANEL
CHOL/HDL RATIO: 3
CHOLESTEROL: 160 mg/dL (ref 0–200)
HDL: 58 mg/dL (ref >39.00)
LDL Cholesterol: 84 mg/dL (ref 0–99)
NonHDL: 102.35
TRIGLYCERIDES: 93 mg/dL (ref 0.0–149.0)
VLDL: 18.6 mg/dL (ref 0.0–40.0)

## 2017-03-04 LAB — PLATELET INHIBITION P2Y12: Platelet Function  P2Y12: 160 [PRU] — ABNORMAL LOW (ref 194–418)

## 2017-03-04 LAB — TSH: TSH: 12.87 u[IU]/mL — AB (ref 0.35–4.50)

## 2017-03-04 MED ORDER — ATORVASTATIN CALCIUM 40 MG PO TABS: 40.0000 mg | ORAL_TABLET | Freq: Every evening | ORAL | 1 refills | Status: DC

## 2017-03-04 MED ORDER — LEVOTHYROXINE SODIUM 137 MCG PO TABS: ORAL_TABLET | ORAL | 1 refills | Status: DC

## 2017-03-04 MED ORDER — ZOLPIDEM TARTRATE 5 MG PO TABS: 5.0000 mg | ORAL_TABLET | Freq: Every evening | ORAL | 1 refills | Status: DC | PRN

## 2017-03-04 MED ORDER — LINACLOTIDE 145 MCG PO CAPS: 145.0000 ug | ORAL_CAPSULE | Freq: Every day | ORAL | 1 refills | Status: DC

## 2017-03-04 NOTE — Telephone Encounter (Signed)
Form for [facility] has been signed and faxed over to get patient assistance at home during recovery period of surgery.

## 2017-03-04 NOTE — Progress Notes (Signed)
Kelsey Hanna is a 67 y.o. female with the following history as recorded in EpicCare:  Patient Active Problem List   Diagnosis Date Noted  . Dysarthria   . Left sided numbness   . Acute left-sided muscle weakness 01/24/2017  . Stroke (cerebrum) ([place]) 01/24/2017  . Intracranial aneurysm 01/24/2017  . Encounter for medication review 02/07/2016  . Hypoxia, sleep related 09/07/2015  . Constipation 07/30/2015  . Dizziness 07/30/2015  . Chronic diastolic CHF (congestive heart failure) ([place]) 06/26/2015  . Bilateral lower extremity edema 06/26/2015  . Mitral regurgitation 05/17/2015  . Accidental drug overdose 05/17/2015  . Respiratory failure with hypoxia and hypercapnia ([place]) 05/09/2015  . OSA (obstructive sleep apnea) 05/09/2015  . Overdose 05/09/2015  . Chronic back pain 03/20/2015  . Right shoulder pain 11/14/2014  . Insomnia 11/14/2014  . Psychoses ([place])   . Bacteremia   . Pedal edema 09/12/2014  . Bipolar disorder, current episode manic without psychotic features, severe ([facility])   . Bipolar disorder ([place]) 09/05/2014  . Bipolar affective disorder, current episode manic with psychotic symptoms ([place]) 09/05/2014  . Bipolar affective disorder, manic ([place]) 09/03/2014  . Encounter for preadmission testing   . Head revolving around 07/27/2014  . BP (high blood pressure) 05/23/2014  . AF (paroxysmal atrial fibrillation) ([place]) 05/23/2014  . Type 2 diabetes mellitus ([place]) 04/27/2014  . Essential hypertension 04/27/2014  . Hypothyroidism 04/27/2014  . Tobacco abuse 04/27/2014  . Bipolar I disorder, most recent episode (or current) unspecified 04/20/2014  . Acute encephalopathy 04/18/2014  . Acute respiratory failure ([place])   . Encounter for feeding tube placement   . Shortness of breath   . Gout 08/25/2013  . 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 HLD (hyperlipidemia) 08/30/2009    Current Outpatient Medications  Medication Sig Dispense Refill  . aspirin 325 MG tablet Take 1 tablet  (325 mg total) by mouth daily. 30 tablet 0  . atorvastatin (LIPITOR) 40 MG tablet Take 1 tablet (40 mg total) by mouth every evening. 90 tablet 1  . clonazePAM (KLONOPIN) 0.5 MG tablet Take 0.5 mg by mouth 2 (two) times daily. 1 in the morning and 1 midday    . clopidogrel (PLAVIX) 75 MG tablet Take 75 mg by mouth daily.    . diphenhydrAMINE (BENADRYL) 25 MG tablet Take 25 mg by mouth daily.    . DOCQLACE 100 MG capsule TAKE 1 TO 2 CAPSULES BY MOUTH TWICE A DAY AS NEEDED FOR MILD CONSTIPATION 60 capsule 2  . gabapentin (NEURONTIN) 100 MG capsule Take 100 mg by mouth 3 (three) times daily.    Marland Kitchen levothyroxine (SYNTHROID, LEVOTHROID) 137 MCG tablet TAKE 1 TABLET BY MOUTH DAILY BEFORE BREAKFAST 90 tablet 1  . linaclotide (LINZESS) 145 MCG CAPS capsule Take 1 capsule (145 mcg total) by mouth daily before breakfast. 90 capsule 1  . OLANZapine (ZYPREXA) 20 MG tablet Take 20 mg by mouth at bedtime.     . Oxycodone HCl 10 MG TABS Take 10 mg by mouth every 6 (six) hours as needed (pain).    Marland Kitchen tiZANidine (ZANAFLEX) 4 MG tablet Take 4 mg by mouth every 8 (eight) hours as needed for muscle spasms.    Marland Kitchen zolpidem (AMBIEN) 5 MG tablet Take 1 tablet (5 mg total) by mouth at bedtime as needed for sleep. 30 tablet 1   No current facility-administered medications for this visit.     Allergies: Patient has no known allergies.  Past Medical History:  Diagnosis Date  . Bipolar 1  disorder ([place])   . Cancer [facility])    cancer of uterus...no chemo or radiation  . Chicken pox   . Depression   . Diabetes mellitus    dx within last yr....just takes pills  . Dyslipidemia   . Hypothyroid   . Retinal detachment   . Sleep apnea     Past Surgical History:  Procedure Laterality Date  . ABDOMINAL HYSTERECTOMY    . IR RADIOLOGIST EVAL & MGMT  02/19/2017  . LUMBAR LAMINECTOMY/DECOMPRESSION MICRODISCECTOMY  07/17/2011   Procedure: LUMBAR LAMINECTOMY/DECOMPRESSION MICRODISCECTOMY;  Surgeon: Sinclair Ship, MD;   Location: [facility];  Service: Orthopedics;  Laterality: Left;  Left sided lumbar 4-5 microdisectomy  . TONSILLECTOMY    . TOTAL ABDOMINAL HYSTERECTOMY W/ BILATERAL SALPINGOOPHORECTOMY      Family History  Problem Relation Age of Onset  . Healthy Mother   . Healthy Father     Social History   Tobacco Use  . Smoking status: Current Every Day Smoker    Packs/day: 0.25    Years: 35.00    Pack years: 8.75    Types: Cigarettes    Last attempt to quit: 07/09/1999    Years since quitting: 17.6  . Smokeless tobacco: Never Used  Substance Use Topics  . Alcohol use: No    Alcohol/week: 0.0 oz    Comment: socially    Subjective:  Patient presents today for medication refills for her chronic medications/ follow-up on chronic care needs including: 1) hyperlipidemia; 2) hypothyroidism; 3) insomnia 4) chronic constipation;  Will be having surgery for aneurysm next week; needs form completed to get her increased home health care; lives by herself and currently only has home health 2 hours/ day;   Objective:  Vitals:   03/04/17 1030  BP: 128/74  Pulse: 80  Temp: 97.8 F (36.6 C)  TempSrc: Oral  SpO2: 97%  Weight: 201 lb 1.9 oz (91.2 kg)  Height: 5\' 3"  (1.6 m)    General: Well developed, well nourished, in no acute distress  Skin : Warm and dry.  Head: Normocephalic and atraumatic  Lungs: Respirations unlabored; clear to auscultation bilaterally without wheeze, rales, rhonchi  CVS exam: normal rate and regular rhythm.  Neurologic: Alert and oriented; speech intact; face symmetrical; moves all extremities well; CNII-XII intact without focal deficit ; walks slowly; left eye is patched- affects distance perception Assessment:  1. Mixed hyperlipidemia   2. Hypothyroidism, unspecified type   3. Constipation, unspecified constipation type   4. Chronic insomnia   5. OSA (obstructive sleep apnea)   6. Bipolar affective disorder, remission status unspecified ([place])   7. Chronic midline low  back pain with left-sided sciatica   8. Chronic diastolic CHF (congestive heart failure) ([place])     Plan:  1. Check CMP, lipid panel today; refill updated; 2. Check TSH; refill updated; 3. Refill updated on Linzess; 4. & 5. Per patient, sleep apnea resolved with weight loss; last sleep study done in 2017; in reviewing notes and in talking with her pharmacy, patient has been getting Ambien monthly from previous provider here; she notes this is the only medication that works for her; refill on Ambien 5 mg qhs prn; 6. Continue with psychiatry; 7. Continue with pain management; 8. Will need to discuss referral to cardiology after she recovers from upcoming aneurysm surgery; she plans to return to establish with new PCP in 3-4 months, sooner prn.   No Follow-up on file.  Orders Placed This Encounter  Procedures  . Comprehensive metabolic  panel    Standing Status:   Future    Number of Occurrences:   1    Standing Expiration Date:   09/01/2017  . Lipid panel    Standing Status:   Future    Number of Occurrences:   1    Standing Expiration Date:   09/01/2017  . TSH    Standing Status:   Future    Number of Occurrences:   1    Standing Expiration Date:   09/01/2017    Requested Prescriptions   Signed Prescriptions Disp Refills  . atorvastatin (LIPITOR) 40 MG tablet 90 tablet 1    Sig: Take 1 tablet (40 mg total) by mouth every evening.  Marland Kitchen levothyroxine (SYNTHROID, LEVOTHROID) 137 MCG tablet 90 tablet 1    Sig: TAKE 1 TABLET BY MOUTH DAILY BEFORE BREAKFAST  . linaclotide (LINZESS) 145 MCG CAPS capsule 90 capsule 1    Sig: Take 1 capsule (145 mcg total) by mouth daily before breakfast.  . zolpidem (AMBIEN) 5 MG tablet 30 tablet 1    Sig: Take 1 tablet (5 mg total) by mouth at bedtime as needed for sleep.

## 2017-03-06 ENCOUNTER — Other Ambulatory Visit: Payer: Self-pay | Admitting: Family

## 2017-03-06 ENCOUNTER — Telehealth: Payer: Self-pay | Admitting: Family

## 2017-03-06 ENCOUNTER — Telehealth: Payer: Self-pay | Admitting: General Practice

## 2017-03-06 MED ORDER — LEVOTHYROXINE SODIUM 150 MCG PO TABS: ORAL_TABLET | ORAL | 0 refills | Status: DC

## 2017-03-06 MED ORDER — ZOLPIDEM TARTRATE 5 MG PO TABS: 5.0000 mg | ORAL_TABLET | Freq: Every evening | ORAL | 1 refills | Status: DC | PRN

## 2017-03-06 NOTE — Telephone Encounter (Signed)
Patient said the pharmacy did not receive the request for the [facility], Please advise

## 2017-03-06 NOTE — Telephone Encounter (Signed)
Copied from [place] [id number]. Topic: Quick Communication - See Telephone Encounter >> Mar 06, 2017 12:37 PM Vernona Rieger wrote: CRM for notification. See Telephone encounter for:   03/06/17.   Patient said the pharmacy did not receive the request for the [place], Please advise  [place], [place]

## 2017-03-06 NOTE — Telephone Encounter (Signed)
I re-sent the Ambien for her; if they don't get it this time, let us know and we can call it in for her or fax it over;  Her new Synthroid dosage is now 150; plan to get labs re-checked in 6-8 weeks.

## 2017-03-06 NOTE — Telephone Encounter (Signed)
Copied from [place] [id number]. Topic: Quick Communication - See Telephone Encounter >> Mar 06, 2017 12:37 PM Vernona Rieger wrote: CRM for notification. See Telephone encounter for:   03/06/17.   Patient said the pharmacy did not receive the request for the [place], Please advise  [place], [place] >> Mar 06, 2017  3:09 PM Robina Ade, Helene Kelp D wrote: Sharyn Lull from [facility] called and said that patient is starting to forget things but the Lorrin Mais was filled 2 days ago for patient therefore she will disregard the last rx refill for patient.

## 2017-03-06 NOTE — Telephone Encounter (Signed)
Duplicate message. 

## 2017-03-06 NOTE — Telephone Encounter (Signed)
I called pharmacy and spoke with Almyra Free. She stated that they had received original script and deleted the second one and patient is all set with refill.

## 2017-03-09 ENCOUNTER — Encounter (HOSPITAL_COMMUNITY): Payer: Self-pay

## 2017-03-09 NOTE — Progress Notes (Signed)
Spoke with patients daughter on the phone and gave detailed instructions about patients surgery and time of arrival. Daughter verbalized understanding and will be contacting patients nurse in order to give medication instructions. Patients daughter was told to have patient take aspirin, plavix, gabapentin, klonopin, synthroid, zanaflex, and oxycodone as needed the morning of surgery.

## 2017-03-10 ENCOUNTER — Telehealth (HOSPITAL_COMMUNITY): Payer: Self-pay | Admitting: Radiology

## 2017-03-10 ENCOUNTER — Telehealth: Payer: Self-pay | Admitting: Nurse Practitioner

## 2017-03-10 NOTE — Telephone Encounter (Signed)
Jonelle Sidle tried Product manager who works with Dr. Estanislado Pandy and LVM for her to call Jonelle Sidle back directly in regards to message below.

## 2017-03-10 NOTE — Telephone Encounter (Signed)
Copied from [place]. Topic: General - Other >> Mar 10, 2017 11:12 AM Patrice Paradise wrote: Reason for CRM: [name] w/[facility] called because the patient is have surgery tomorrow and would like to coordinate home health care for her. [name] can be reached at [phone]. Anderson Malta w/Dr. Estanislado Pandy, he is doing the surgery can be reached @ [phone] with any questions.

## 2017-03-10 NOTE — Telephone Encounter (Signed)
Called Kelsey Hanna and let her know that if she does not have someone who can stay with her 24 hours a day for a few days post-procedure we will have to cancel. She asked me to call her daughter with this information. I informed her that I had already called her daughter and left her a voicemail to call me back to discuss.  Called Adrian Prows, patient's daughter, left her a VM to call me back. Told her that her mother's procedure will be canceled if she has no one to stay with her after her procedure. JM

## 2017-03-10 NOTE — Progress Notes (Signed)
[name], pt ACT Team RN, called concerned that this pt lives alone and will be alone after procedure on Wednesday 03/11/17. I called Anderson Malta, Dr Estanislado Pandy scheduler and she is working with pt and pt nurse to try to accommodate pt needs after procedure. Transferred [name] to [place] to talk on the phone.

## 2017-03-10 NOTE — Telephone Encounter (Signed)
LVM for [name] to call me back on my direct number in regards.

## 2017-03-10 NOTE — Telephone Encounter (Signed)
Have spoken with multiple people from patient's care team as well as her daughter. Due to the fact that there is no one available to care for this patient following her procedure that is scheduled for tomorrow, 03/11/17, we are going to postpone her procedure. I am working with the patient's PCP office and ACT team to ensure proper after care and rescheduling her procedure as soon as that is in place. JM

## 2017-03-11 ENCOUNTER — Ambulatory Visit (HOSPITAL_COMMUNITY)
Admission: RE | Admit: 2017-03-11 | Payer: Medicare Other | Source: Ambulatory Visit | Admitting: Interventional Radiology

## 2017-03-11 ENCOUNTER — Ambulatory Visit (HOSPITAL_COMMUNITY)
Admission: RE | Admit: 2017-03-11 | Discharge: 2017-03-11 | Disposition: A | Discharge status: Home / Self Care | Payer: Medicare Other | Source: Ambulatory Visit | Attending: Interventional Radiology | Admitting: Interventional Radiology

## 2017-03-11 ENCOUNTER — Encounter (HOSPITAL_COMMUNITY): Payer: Self-pay

## 2017-03-11 HISTORY — DX: Anxiety disorder, unspecified: F41.9

## 2017-03-11 SURGERY — IR WITH ANESTHESIA
Anesthesia: General

## 2017-03-11 NOTE — Telephone Encounter (Signed)
I have talked with Rip Harbour at Dr [facility] [place] office and advised that we are not the responsible party for getting approval from insurance for post op care---we did not place referral for elective sx, and we have no way to assess patient after sx to determine what type of care is needed---melinda will let jennifer/dtr assistant know, and they will check with social services/case mgnt for outpatient care to see how they can get this accomplished

## 2017-03-12 ENCOUNTER — Emergency Department (HOSPITAL_COMMUNITY): Payer: Medicare Other

## 2017-03-12 ENCOUNTER — Other Ambulatory Visit: Payer: Self-pay

## 2017-03-12 ENCOUNTER — Inpatient Hospital Stay (HOSPITAL_COMMUNITY)
Admission: EM | Admit: 2017-03-12 | Discharge: 2017-03-18 | DRG: 025 | Disposition: A | Discharge status: Skilled Nursing Facility | Payer: Medicare Other | Attending: Family Medicine | Admitting: Family Medicine

## 2017-03-12 ENCOUNTER — Other Ambulatory Visit: Payer: Self-pay | Admitting: Family

## 2017-03-12 ENCOUNTER — Ambulatory Visit: Payer: Self-pay

## 2017-03-12 ENCOUNTER — Encounter (HOSPITAL_COMMUNITY): Payer: Self-pay | Admitting: Internal Medicine

## 2017-03-12 ENCOUNTER — Telehealth (HOSPITAL_COMMUNITY): Payer: Self-pay | Admitting: Radiology

## 2017-03-12 DIAGNOSIS — Z79899 Other long term (current) drug therapy: Secondary | ICD-10-CM

## 2017-03-12 DIAGNOSIS — I340 Nonrheumatic mitral (valve) insufficiency: Secondary | ICD-10-CM | POA: Diagnosis present

## 2017-03-12 DIAGNOSIS — E785 Hyperlipidemia, unspecified: Secondary | ICD-10-CM | POA: Diagnosis present

## 2017-03-12 DIAGNOSIS — I110 Hypertensive heart disease with heart failure: Secondary | ICD-10-CM | POA: Diagnosis present

## 2017-03-12 DIAGNOSIS — F319 Bipolar disorder, unspecified: Secondary | ICD-10-CM | POA: Diagnosis present

## 2017-03-12 DIAGNOSIS — M549 Dorsalgia, unspecified: Secondary | ICD-10-CM | POA: Diagnosis not present

## 2017-03-12 DIAGNOSIS — Z9114 Patient's other noncompliance with medication regimen: Secondary | ICD-10-CM

## 2017-03-12 DIAGNOSIS — R531 Weakness: Secondary | ICD-10-CM | POA: Diagnosis not present

## 2017-03-12 DIAGNOSIS — E119 Type 2 diabetes mellitus without complications: Secondary | ICD-10-CM | POA: Diagnosis present

## 2017-03-12 DIAGNOSIS — R402364 Coma scale, best motor response, obeys commands, 24 hours or more after hospital admission: Secondary | ICD-10-CM | POA: Diagnosis not present

## 2017-03-12 DIAGNOSIS — R4781 Slurred speech: Secondary | ICD-10-CM | POA: Diagnosis not present

## 2017-03-12 DIAGNOSIS — R7881 Bacteremia: Secondary | ICD-10-CM

## 2017-03-12 DIAGNOSIS — R55 Syncope and collapse: Secondary | ICD-10-CM | POA: Diagnosis not present

## 2017-03-12 DIAGNOSIS — E669 Obesity, unspecified: Secondary | ICD-10-CM | POA: Diagnosis not present

## 2017-03-12 DIAGNOSIS — M5442 Lumbago with sciatica, left side: Secondary | ICD-10-CM | POA: Diagnosis not present

## 2017-03-12 DIAGNOSIS — L7632 Postprocedural hematoma of skin and subcutaneous tissue following other procedure: Secondary | ICD-10-CM | POA: Diagnosis not present

## 2017-03-12 DIAGNOSIS — F17210 Nicotine dependence, cigarettes, uncomplicated: Secondary | ICD-10-CM | POA: Diagnosis present

## 2017-03-12 DIAGNOSIS — H548 Legal blindness, as defined in USA: Secondary | ICD-10-CM | POA: Diagnosis present

## 2017-03-12 DIAGNOSIS — I480 Paroxysmal atrial fibrillation: Secondary | ICD-10-CM | POA: Diagnosis present

## 2017-03-12 DIAGNOSIS — Z8673 Personal history of transient ischemic attack (TIA), and cerebral infarction without residual deficits: Secondary | ICD-10-CM

## 2017-03-12 DIAGNOSIS — G8929 Other chronic pain: Secondary | ICD-10-CM | POA: Diagnosis not present

## 2017-03-12 DIAGNOSIS — I952 Hypotension due to drugs: Secondary | ICD-10-CM | POA: Diagnosis not present

## 2017-03-12 DIAGNOSIS — B9562 Methicillin resistant Staphylococcus aureus infection as the cause of diseases classified elsewhere: Secondary | ICD-10-CM | POA: Diagnosis present

## 2017-03-12 DIAGNOSIS — R402252 Coma scale, best verbal response, oriented, at arrival to emergency department: Secondary | ICD-10-CM | POA: Diagnosis not present

## 2017-03-12 DIAGNOSIS — E038 Other specified hypothyroidism: Secondary | ICD-10-CM | POA: Diagnosis not present

## 2017-03-12 DIAGNOSIS — I5032 Chronic diastolic (congestive) heart failure: Secondary | ICD-10-CM | POA: Diagnosis not present

## 2017-03-12 DIAGNOSIS — R471 Dysarthria and anarthria: Secondary | ICD-10-CM | POA: Diagnosis present

## 2017-03-12 DIAGNOSIS — R402134 Coma scale, eyes open, to sound, 24 hours or more after hospital admission: Secondary | ICD-10-CM | POA: Diagnosis not present

## 2017-03-12 DIAGNOSIS — I10 Essential (primary) hypertension: Secondary | ICD-10-CM | POA: Diagnosis not present

## 2017-03-12 DIAGNOSIS — G92 Toxic encephalopathy: Secondary | ICD-10-CM | POA: Diagnosis not present

## 2017-03-12 DIAGNOSIS — Z8619 Personal history of other infectious and parasitic diseases: Secondary | ICD-10-CM

## 2017-03-12 DIAGNOSIS — Z8542 Personal history of malignant neoplasm of other parts of uterus: Secondary | ICD-10-CM

## 2017-03-12 DIAGNOSIS — I671 Cerebral aneurysm, nonruptured: Secondary | ICD-10-CM | POA: Diagnosis not present

## 2017-03-12 DIAGNOSIS — I9589 Other hypotension: Secondary | ICD-10-CM

## 2017-03-12 DIAGNOSIS — R4789 Other speech disturbances: Secondary | ICD-10-CM | POA: Diagnosis not present

## 2017-03-12 DIAGNOSIS — F419 Anxiety disorder, unspecified: Secondary | ICD-10-CM | POA: Diagnosis present

## 2017-03-12 DIAGNOSIS — Z90710 Acquired absence of both cervix and uterus: Secondary | ICD-10-CM

## 2017-03-12 DIAGNOSIS — R402362 Coma scale, best motor response, obeys commands, at arrival to emergency department: Secondary | ICD-10-CM | POA: Diagnosis not present

## 2017-03-12 DIAGNOSIS — R402142 Coma scale, eyes open, spontaneous, at arrival to emergency department: Secondary | ICD-10-CM | POA: Diagnosis not present

## 2017-03-12 DIAGNOSIS — E039 Hypothyroidism, unspecified: Secondary | ICD-10-CM | POA: Diagnosis present

## 2017-03-12 DIAGNOSIS — Z6838 Body mass index (BMI) 38.0-38.9, adult: Secondary | ICD-10-CM

## 2017-03-12 DIAGNOSIS — T50905A Adverse effect of unspecified drugs, medicaments and biological substances, initial encounter: Secondary | ICD-10-CM | POA: Diagnosis not present

## 2017-03-12 DIAGNOSIS — G4733 Obstructive sleep apnea (adult) (pediatric): Secondary | ICD-10-CM | POA: Diagnosis present

## 2017-03-12 DIAGNOSIS — Z7902 Long term (current) use of antithrombotics/antiplatelets: Secondary | ICD-10-CM

## 2017-03-12 DIAGNOSIS — I959 Hypotension, unspecified: Secondary | ICD-10-CM | POA: Diagnosis not present

## 2017-03-12 DIAGNOSIS — I361 Nonrheumatic tricuspid (valve) insufficiency: Secondary | ICD-10-CM | POA: Diagnosis not present

## 2017-03-12 DIAGNOSIS — Z7982 Long term (current) use of aspirin: Secondary | ICD-10-CM

## 2017-03-12 DIAGNOSIS — Y838 Other surgical procedures as the cause of abnormal reaction of the patient, or of later complication, without mention of misadventure at the time of the procedure: Secondary | ICD-10-CM | POA: Diagnosis not present

## 2017-03-12 DIAGNOSIS — M533 Sacrococcygeal disorders, not elsewhere classified: Secondary | ICD-10-CM | POA: Diagnosis present

## 2017-03-12 DIAGNOSIS — I6789 Other cerebrovascular disease: Secondary | ICD-10-CM | POA: Diagnosis not present

## 2017-03-12 DIAGNOSIS — R402244 Coma scale, best verbal response, confused conversation, 24 hours or more after hospital admission: Secondary | ICD-10-CM | POA: Diagnosis not present

## 2017-03-12 DIAGNOSIS — Z79890 Hormone replacement therapy (postmenopausal): Secondary | ICD-10-CM

## 2017-03-12 LAB — URINALYSIS, ROUTINE W REFLEX MICROSCOPIC
Bilirubin Urine: NEGATIVE
Glucose, UA: NEGATIVE mg/dL
HGB URINE DIPSTICK: NEGATIVE
Ketones, ur: NEGATIVE mg/dL
Leukocytes, UA: NEGATIVE
NITRITE: NEGATIVE
PH: 5 (ref 5.0–8.0)
Protein, ur: NEGATIVE mg/dL
SPECIFIC GRAVITY, URINE: 1.03 (ref 1.005–1.030)

## 2017-03-12 LAB — COMPREHENSIVE METABOLIC PANEL
ALK PHOS: 55 U/L (ref 38–126)
ALT: 17 U/L (ref 14–54)
AST: 18 U/L (ref 15–41)
Albumin: 3 g/dL — ABNORMAL LOW (ref 3.5–5.0)
Anion gap: 8 (ref 5–15)
BUN: 13 mg/dL (ref 6–20)
CALCIUM: 8.3 mg/dL — AB (ref 8.9–10.3)
CHLORIDE: 103 mmol/L (ref 101–111)
CO2: 25 mmol/L (ref 22–32)
CREATININE: 0.93 mg/dL (ref 0.44–1.00)
GFR calc non Af Amer: >60 mL/min (ref >60)
GLUCOSE: 92 mg/dL (ref 65–99)
Potassium: 4.1 mmol/L (ref 3.5–5.1)
SODIUM: 136 mmol/L (ref 135–145)
Total Bilirubin: 0.4 mg/dL (ref 0.3–1.2)
Total Protein: 5.1 g/dL — ABNORMAL LOW (ref 6.5–8.1)

## 2017-03-12 LAB — I-STAT CHEM 8, ED
BUN: 13 mg/dL (ref 6–20)
CALCIUM ION: 1.17 mmol/L (ref 1.15–1.40)
CHLORIDE: 103 mmol/L (ref 101–111)
Creatinine, Ser: 0.9 mg/dL (ref 0.44–1.00)
GLUCOSE: 100 mg/dL — AB (ref 65–99)
HCT: 40 % (ref 36.0–46.0)
Hemoglobin: 13.6 g/dL (ref 12.0–15.0)
POTASSIUM: 4.4 mmol/L (ref 3.5–5.1)
Sodium: 140 mmol/L (ref 135–145)
TCO2: 26 mmol/L (ref 22–32)

## 2017-03-12 LAB — PROTIME-INR
INR: 1.1
PROTHROMBIN TIME: 14.1 s (ref 11.4–15.2)

## 2017-03-12 LAB — CBC
HCT: 35.6 % — ABNORMAL LOW (ref 36.0–46.0)
Hemoglobin: 11.3 g/dL — ABNORMAL LOW (ref 12.0–15.0)
MCH: 29 pg (ref 26.0–34.0)
MCHC: 31.7 g/dL (ref 30.0–36.0)
MCV: 91.3 fL (ref 78.0–100.0)
PLATELETS: 206 10*3/uL (ref 150–400)
RBC: 3.9 MIL/uL (ref 3.87–5.11)
RDW: 14.7 % (ref 11.5–15.5)
WBC: 8.6 10*3/uL (ref 4.0–10.5)

## 2017-03-12 LAB — TSH: TSH: 10.396 u[IU]/mL — ABNORMAL HIGH (ref 0.350–4.500)

## 2017-03-12 LAB — I-STAT TROPONIN, ED: Troponin i, poc: 0 ng/mL (ref 0.00–0.08)

## 2017-03-12 LAB — DIFFERENTIAL
BASOS ABS: 0 10*3/uL (ref 0.0–0.1)
BASOS PCT: 0 %
Eosinophils Absolute: 0.2 10*3/uL (ref 0.0–0.7)
Eosinophils Relative: 2 %
LYMPHS PCT: 34 %
Lymphs Abs: 2.9 10*3/uL (ref 0.7–4.0)
MONO ABS: 0.4 10*3/uL (ref 0.1–1.0)
Monocytes Relative: 5 %
NEUTROS ABS: 5 10*3/uL (ref 1.7–7.7)
NEUTROS PCT: 59 %

## 2017-03-12 LAB — APTT: APTT: 30 s (ref 24–36)

## 2017-03-12 LAB — RAPID URINE DRUG SCREEN, HOSP PERFORMED
AMPHETAMINES: NOT DETECTED
BARBITURATES: NOT DETECTED
Benzodiazepines: POSITIVE — AB
COCAINE: NOT DETECTED
OPIATES: POSITIVE — AB
TETRAHYDROCANNABINOL: NOT DETECTED

## 2017-03-12 LAB — PLATELET INHIBITION P2Y12: PLATELET FUNCTION P2Y12: 175 [PRU] — AB (ref 194–418)

## 2017-03-12 LAB — I-STAT CG4 LACTIC ACID, ED: Lactic Acid, Venous: 1.51 mmol/L (ref 0.5–1.9)

## 2017-03-12 MED ORDER — OLANZAPINE 10 MG PO TABS
20.0000 mg | ORAL_TABLET | Freq: Every day | ORAL | Status: DC
  Administered 2017-03-12 – 2017-03-17 (×6): 20 mg via ORAL
  Filled 2017-03-12 (×8): qty 2

## 2017-03-12 MED ORDER — LEVOTHYROXINE SODIUM 75 MCG PO TABS
150.0000 ug | ORAL_TABLET | Freq: Every day | ORAL | Status: DC
  Administered 2017-03-13 – 2017-03-18 (×6): 150 ug via ORAL
  Filled 2017-03-12 (×7): qty 2

## 2017-03-12 MED ORDER — OXYCODONE HCL 5 MG PO TABS: 10.0000 mg | ORAL_TABLET | Freq: Four times a day (QID) | ORAL | Status: DC | PRN

## 2017-03-12 MED ORDER — GABAPENTIN 100 MG PO CAPS
100.0000 mg | ORAL_CAPSULE | Freq: Two times a day (BID) | ORAL | Status: DC
  Administered 2017-03-13 – 2017-03-18 (×10): 100 mg via ORAL
  Filled 2017-03-12 (×10): qty 1

## 2017-03-12 MED ORDER — ONDANSETRON HCL 4 MG/2ML IJ SOLN: 4.0000 mg | Freq: Four times a day (QID) | INTRAMUSCULAR | Status: DC | PRN

## 2017-03-12 MED ORDER — OXYCODONE HCL 5 MG PO TABS
5.0000 mg | ORAL_TABLET | Freq: Four times a day (QID) | ORAL | Status: DC | PRN
  Administered 2017-03-12 – 2017-03-14 (×4): 5 mg via ORAL
  Filled 2017-03-12 (×4): qty 1

## 2017-03-12 MED ORDER — ONDANSETRON HCL 4 MG PO TABS: 4.0000 mg | ORAL_TABLET | Freq: Four times a day (QID) | ORAL | Status: DC | PRN

## 2017-03-12 MED ORDER — OXYCODONE HCL 10 MG PO TABS: 10.0000 mg | ORAL_TABLET | Freq: Four times a day (QID) | ORAL | Status: DC | PRN

## 2017-03-12 MED ORDER — GABAPENTIN 100 MG PO CAPS: 100.0000 mg | ORAL_CAPSULE | Freq: Three times a day (TID) | ORAL | Status: DC

## 2017-03-12 MED ORDER — SODIUM CHLORIDE 0.9 % IV BOLUS (SEPSIS)
1000.0000 mL | Freq: Once | INTRAVENOUS | Status: AC
  Administered 2017-03-12: 1000 mL via INTRAVENOUS

## 2017-03-12 MED ORDER — ASPIRIN 325 MG PO TABS: 325.0000 mg | ORAL_TABLET | Freq: Every day | ORAL | Status: DC

## 2017-03-12 MED ORDER — DIPHENHYDRAMINE HCL 25 MG PO CAPS: 25.0000 mg | ORAL_CAPSULE | Freq: Every day | ORAL | Status: DC

## 2017-03-12 MED ORDER — SODIUM CHLORIDE 0.9 % IV SOLN
INTRAVENOUS | Status: AC
  Administered 2017-03-12 – 2017-03-13 (×3): via INTRAVENOUS

## 2017-03-12 MED ORDER — LINACLOTIDE 145 MCG PO CAPS
145.0000 ug | ORAL_CAPSULE | Freq: Every day | ORAL | Status: DC
  Administered 2017-03-14 – 2017-03-18 (×5): 145 ug via ORAL
  Filled 2017-03-12 (×8): qty 1

## 2017-03-12 MED ORDER — CLOPIDOGREL BISULFATE 75 MG PO TABS: 75.0000 mg | ORAL_TABLET | Freq: Every day | ORAL | Status: DC

## 2017-03-12 MED ORDER — TIZANIDINE HCL 4 MG PO TABS
4.0000 mg | ORAL_TABLET | Freq: Three times a day (TID) | ORAL | Status: DC | PRN
  Administered 2017-03-12 – 2017-03-17 (×4): 4 mg via ORAL
  Filled 2017-03-12 (×5): qty 1

## 2017-03-12 MED ORDER — ACETAMINOPHEN 325 MG PO TABS
650.0000 mg | ORAL_TABLET | Freq: Four times a day (QID) | ORAL | Status: DC | PRN
  Administered 2017-03-14 – 2017-03-15 (×3): 650 mg via ORAL
  Filled 2017-03-12 (×4): qty 2

## 2017-03-12 MED ORDER — ATORVASTATIN CALCIUM 40 MG PO TABS
40.0000 mg | ORAL_TABLET | Freq: Every evening | ORAL | Status: DC
  Administered 2017-03-12 – 2017-03-18 (×7): 40 mg via ORAL
  Filled 2017-03-12 (×7): qty 1

## 2017-03-12 MED ORDER — ACETAMINOPHEN 650 MG RE SUPP
650.0000 mg | Freq: Four times a day (QID) | RECTAL | Status: DC | PRN
Start: 2017-03-12 — End: 2017-03-18

## 2017-03-12 MED ORDER — DOCUSATE SODIUM 100 MG PO CAPS
100.0000 mg | ORAL_CAPSULE | Freq: Every day | ORAL | Status: DC | PRN
  Administered 2017-03-12 – 2017-03-15 (×3): 100 mg via ORAL
  Filled 2017-03-12 (×3): qty 1

## 2017-03-12 MED ORDER — CLONAZEPAM 0.5 MG PO TABS
0.5000 mg | ORAL_TABLET | Freq: Two times a day (BID) | ORAL | Status: DC
  Administered 2017-03-12 – 2017-03-18 (×11): 0.5 mg via ORAL
  Filled 2017-03-12 (×11): qty 1

## 2017-03-12 NOTE — H&P (Signed)
History and Physical    CAYLOR CERINO PXT:[id number] DOB: Aug 26, 1950 DOA: 03/12/2017  PCP: Patient, No Pcp Per Patient coming from: home  Chief Complaint: slurred speech  HPI: Kelsey Hanna is a 67 y.o. female with medical history significant for bipolar disorder, anxiety, diabetes, hyperlipidemia, sleep apnea, chronic diastolic heart failure chronic back pain, hypertension, bilateral ICA aneurysms presents to the emergency Department chief complaint of slurred speech. Presentation she had a stat noncontrast CT of the head that was unrevealing for any acute process. She was evaluated by neurology who recommended a medical admission hypotension. [facility] hospitalists were asked to admit  Information is obtained from the chart and the patient. She says symptoms started around in today. He suddenly developed slurred speech and difficulty walking. He states he was able to crawl so she called to the telephone and called EMS. She reports that she was supposed every aneurysms clipped yesterday but the procedure was canceled as postop care was not able to be arranged. Chart review indicates she had a stroke workup about a month ago which revealed no stroke but cerebral aneurysm. When she presented today code stroke was activated. She denies headache dizziness syncope or near-syncope. She denies chest pain palpitation shortness of breath lower extremity edema. She denies any fever chills recent travel or sick contacts. She denies abdominal pain nausea vomiting diarrhea constipation melena bright red blood per rectum. She denies dysuria hematuria frequency or urgency. She denies any numbness or tingling of her extremities.    ED Course: In emergency department she's afebrile with a somewhat soft blood pressure. No tachycardia no tachypnea she's not hypoxic. She is provided with 2 L normal saline and blood pressure is improved upon admission  Review of Systems: As per HPI otherwise all other systems  reviewed and are negative.   Ambulatory Status: Amylase independently is independent with ADLs  Past Medical History:  Diagnosis Date  . Anxiety   . Bipolar 1 disorder ([place])   . Cancer [facility])    cancer of uterus...no chemo or radiation  . Chicken pox   . Depression   . Diabetes mellitus    dx within last yr....just takes pills  . Dyslipidemia   . Hypothyroid   . Retinal detachment   . Sleep apnea     Past Surgical History:  Procedure Laterality Date  . ABDOMINAL HYSTERECTOMY    . IR RADIOLOGIST EVAL & MGMT  02/19/2017  . LUMBAR LAMINECTOMY/DECOMPRESSION MICRODISCECTOMY  07/17/2011   Procedure: LUMBAR LAMINECTOMY/DECOMPRESSION MICRODISCECTOMY;  Surgeon: Sinclair Ship, MD;  Location: [place];  Service: Orthopedics;  Laterality: Left;  Left sided lumbar 4-5 microdisectomy  . TONSILLECTOMY    . TOTAL ABDOMINAL HYSTERECTOMY W/ BILATERAL SALPINGOOPHORECTOMY      Social History   Socioeconomic History  . Marital status: Divorced    Spouse name: Not on file  . Number of children: 2  . Years of education: 2  . Highest education level: Not on file  Social Needs  . Financial resource strain: Not on file  . Food insecurity - worry: Not on file  . Food insecurity - inability: Not on file  . Transportation needs - medical: Not on file  . Transportation needs - non-medical: Not on file  Occupational History  . Occupation: Disability  Tobacco Use  . Smoking status: Current Every Day Smoker    Packs/day: 0.25    Years: 35.00    Pack years: 8.75    Types: Cigarettes    Last attempt to  quit: 07/09/1999    Years since quitting: 17.6  . Smokeless tobacco: Never Used  Substance and Sexual Activity  . Alcohol use: No    Alcohol/week: 0.0 oz    Comment: socially  . Drug use: No  . Sexual activity: Not on file  Other Topics Concern  . Not on file  Social History Narrative   Denies abuse and feels safe at home.     No Known Allergies  Family History  Problem Relation Age  of Onset  . Healthy Mother   . Healthy Father     Prior to Admission medications   Medication Sig Start Date End Date Taking? Authorizing Provider  aspirin 325 MG tablet Take 1 tablet (325 mg total) by mouth daily. 01/24/17  Yes Aline August, MD  atorvastatin (LIPITOR) 40 MG tablet Take 1 tablet (40 mg total) by mouth every evening. 03/04/17  Yes [name], FNP  clonazePAM (KLONOPIN) 0.5 MG tablet Take 0.5 mg by mouth 2 (two) times daily. 1 in the morning and 1 midday   Yes [provider]  clopidogrel (PLAVIX) 75 MG tablet Take 75 mg by mouth daily.   Yes [provider]  diphenhydrAMINE (BENADRYL) 25 MG tablet Take 25 mg by mouth daily.   Yes [provider]  DOCQLACE 100 MG capsule TAKE 1 TO 2 CAPSULES BY MOUTH TWICE A DAY AS NEEDED FOR MILD CONSTIPATION 11/11/16  Yes Golden Circle, FNP  gabapentin (NEURONTIN) 100 MG capsule Take 100 mg by mouth 3 (three) times daily.   Yes [provider]  levothyroxine (SYNTHROID, LEVOTHROID) 150 MCG tablet TAKE 1 TABLET BY MOUTH DAILY BEFORE BREAKFAST 03/06/17  Yes [name], FNP  linaclotide [facility]) 145 MCG CAPS capsule Take 1 capsule (145 mcg total) by mouth daily before breakfast. 03/04/17  Yes [name], FNP  OLANZapine (ZYPREXA) 20 MG tablet Take 20 mg by mouth at bedtime.  02/11/17  Yes [provider]  Oxycodone HCl 10 MG TABS Take 10 mg by mouth every 6 (six) hours as needed (pain).   Yes [provider]  tiZANidine (ZANAFLEX) 4 MG tablet Take 4 mg by mouth every 8 (eight) hours as needed for muscle spasms.   Yes [provider]  zolpidem (AMBIEN) 5 MG tablet Take 1 tablet (5 mg total) by mouth at bedtime as needed for sleep. 03/06/17  Yes [name], FNP    Physical Exam: Vitals:   03/12/17 1430 03/12/17 1445 03/12/17 1500 03/12/17 1530  BP: (!) 88/52 (!) 102/54 (!) 102/57 103/62  Pulse: (!) 57 63 61   Resp: (!) 22 17 16 17     Temp:      TempSrc:      SpO2: 98% 98% 98%   Weight:      Height:         General:  Appears calm and comfortable sitting up in bed in no acute distress Eyes:  PERRL, EOMI, left eyelid closed no scleral icterus ENT:  grossly normal hearing, lips & tongue, mucous membranes of her mouth are moist and pink Neck:  no LAD, masses or thyromegaly Cardiovascular:  RRR, no m/r/g. trace LE edema. Pedal pulses present and palpable Respiratory:  CTA bilaterally, no w/r/r. Normal respiratory effort. Abdomen:  soft, ntnd, bowel sounds guarding or rebounding Skin:  no rash or induration seen on limited exam Musculoskeletal:  grossly normal tone BUE/BLE, good ROM, no bony abnormality Psychiatric:  grossly normal mood and affect, speech fluent and appropriate,  AOx3 Neurologic:  CN 2-12 grossly intact, moves all extremities in coordinated fashion, sensation intact speech clear facial symmetry tongue midline  Labs on Admission: I have personally reviewed following labs and imaging studies  CBC: Recent Labs  Lab 03/12/17 1324 03/12/17 1400  WBC  --  8.6  NEUTROABS  --  5.0  HGB 13.6 11.3*  HCT 40.0 35.6*  MCV  --  91.3  PLT  --  270   Basic Metabolic Panel: Recent Labs  Lab 03/12/17 1324 03/12/17 1400  NA 140 136  K 4.4 4.1  CL 103 103  CO2  --  25  GLUCOSE 100* 92  BUN 13 13  CREATININE 0.90 0.93  CALCIUM  --  8.3*   GFR: Estimated Creatinine Clearance: 61.9 mL/min (by C-G formula based on SCr of 0.93 mg/dL). Liver Function Tests: Recent Labs  Lab 03/12/17 1400  AST 18  ALT 17  ALKPHOS 55  BILITOT 0.4  PROT 5.1*  ALBUMIN 3.0*   No results for input(s): LIPASE, AMYLASE in the last 168 hours. No results for input(s): AMMONIA in the last 168 hours. Coagulation Profile: Recent Labs  Lab 03/12/17 1400  INR 1.10   Cardiac Enzymes: No results for input(s): CKTOTAL, CKMB, CKMBINDEX, TROPONINI in the last 168 hours. BNP (last 3 results) No results for input(s): PROBNP in  the last 8760 hours. HbA1C: No results for input(s): HGBA1C in the last 72 hours. CBG: No results for input(s): GLUCAP in the last 168 hours. Lipid Profile: No results for input(s): CHOL, HDL, LDLCALC, TRIG, CHOLHDL, LDLDIRECT in the last 72 hours. Thyroid Function Tests: Recent Labs    03/12/17 1400  TSH 10.396*   Anemia Panel: No results for input(s): VITAMINB12, FOLATE, FERRITIN, TIBC, IRON, RETICCTPCT in the last 72 hours. Urine analysis:    Component Value Date/Time   COLORURINE YELLOW 03/12/2017 1400   APPEARANCEUR HAZY (A) 03/12/2017 1400   LABSPEC 1.030 03/12/2017 1400   PHURINE 5.0 03/12/2017 1400   GLUCOSEU NEGATIVE 03/12/2017 1400   HGBUR NEGATIVE 03/12/2017 1400   BILIRUBINUR NEGATIVE 03/12/2017 1400   KETONESUR NEGATIVE 03/12/2017 1400   PROTEINUR NEGATIVE 03/12/2017 1400   UROBILINOGEN 0.2 10/20/2014 0814   NITRITE NEGATIVE 03/12/2017 1400   LEUKOCYTESUR NEGATIVE 03/12/2017 1400    Creatinine Clearance: Estimated Creatinine Clearance: 61.9 mL/min (by C-G formula based on SCr of 0.93 mg/dL).  Sepsis Labs: @LABRCNTIP (procalcitonin:4,lacticidven:4) )No results found for this or any previous visit (from the past 240 hour(s)).   Radiological Exams on Admission: Ct Angio Head W Or Wo Contrast  Result Date: 03/12/2017 CLINICAL DATA:  67 year old female with abnormal speech and bilateral weakness. Bilateral ICA paraophthalmic aneurysms. EXAM: CT ANGIOGRAPHY HEAD AND NECK CT PERFUSION BRAIN TECHNIQUE: Multidetector CT imaging of the head and neck was performed using the standard protocol during bolus administration of intravenous contrast. Multiplanar CT image reconstructions and MIPs were obtained to evaluate the vascular anatomy. Carotid stenosis measurements (when applicable) are obtained utilizing NASCET criteria, using the distal internal carotid diameter as the denominator. Multiphase CT imaging of the brain was performed following IV bolus contrast injection.  Subsequent parametric perfusion maps were calculated using RAPID software. CONTRAST:  100 milliliters Isovue 370 COMPARISON:  Head CT without contrast 1338 hr today. FINDINGS: CT Brain Perfusion Findings: CBF (<30%) Volume: 0 milliliters Perfusion (Tmax>6.0s) volume: 0 milliliters Mismatch Volume: Not applicablemL Infarction Location:Not applicable CTA NECK Skeleton: Absent dentition. Cervical spine degeneration. Stable visualized osseous structures. Upper chest: Patchy and dependent pulmonary opacity in the upper  lungs is similar to that in December and may reflect a combination of chronic lung disease and atelectasis. No superior mediastinal lymphadenopathy. Other neck: Negative aside from a partially retropharyngeal course of both carotid arteries. No cervical lymphadenopathy. Aortic arch: 3 vessel arch configuration. Stable mild to moderate arch and great vessel origin calcified plaque. Right carotid system: Mildly increased soft plaque in the ventral brachiocephalic artery since December, or might have been obscured by artifact previously. No significant brachiocephalic artery stenosis. No right CCA origin stenosis. Tortuous proximal right CCA. Mild plaque in the right CCA and at the right carotid bifurcation without stenosis. Partially retropharyngeal but otherwise negative cervical right ICA. Left carotid system: Stable calcified plaque at the left CCA origin without stenosis. Intermittent mild plaque in the left CCA. Minimal calcified plaque at the left ICA origin and bulb without stenosis. Mildly tortuous cervical left ICA with a partially retropharyngeal course. Vertebral arteries: Stable tortuosity of the proximal right subclavian artery with a kinked appearance before the right vertebral artery origin. Stable calcified plaque at the right vertebral artery origin with mild stenosis. The right vertebral artery is patent to the skull base without additional stenosis. No proximal left subclavian artery  stenosis despite some soft plaque. Normal left vertebral artery origin. Tortuous left V1 segment with a mildly kinked appearance. Patent left vertebral artery to the skull base without additional stenosis. CTA HEAD Posterior circulation: Codominant distal vertebral arteries. Patent left PICA origin. The right AICA appears dominant. Patent vertebrobasilar junction. Patent basilar artery without stenosis. Normal SCA and left PCA origins. Fetal type right PCA origin. Bilateral PCA branches are within normal limits. Anterior circulation: Both ICA siphons are patent. Mild siphon plaque. No siphon stenosis. 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 This appears to arise from the undersurface of the left supraclinoid segment (paraophthalmic). 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 This aneurysm might incorporate the origin of the right ophthalmic artery, uncertain. Carotid termini are stable and patent. Dominant left ACA A1 segment, the right A1 is diminutive. MCA and ACA origins are normal. Anterior communicating artery and bilateral ACA branches are within normal limits. Left MCA M1 segment, left MCA bifurcation, and left MCA branches are stable and within normal limits. Right MCA M1 segment, bifurcation, and right MCA branches are stable and within normal limits. Venous sinuses: Patent. Anatomic variants: Dominant left and diminutive right ACA A1 segments. Fetal type right PCA origin. Review of the MIP images confirms the above findings IMPRESSION: 1. Negative for emergent large vessel occlusion. No core infarct or penumbra detected by CTP. Results of #1 were communicated to Dr. Rory Percy at 1411 hr on 1/24/2019by text page via the [facility] messaging system. 2. Bilateral ICA paraophthalmic artery aneurysms  appear stable since the MRA 01/24/2017: Lobulated 8-9 mm right ICA paraophthalmic aneurysm, and round 5 mm left ICA paraophthalmic aneurysm. 3. Up to moderate arch and great vessel origin atherosclerosis, but otherwise mild for age atherosclerosis in the head and neck. No significant arterial stenosis identified. 4. Nonspecific patchy opacity in both upper lungs appears similar to that on 01/24/17, perhaps a combination of atelectasis and chronic lung disease. Electronically Signed   By: Genevie Ann M.D.   On: 03/12/2017 14:24   Ct Angio Neck W Or Wo Contrast  Result Date: 03/12/2017 CLINICAL DATA:  67 year old female with abnormal speech and bilateral weakness. Bilateral ICA paraophthalmic aneurysms. EXAM: CT ANGIOGRAPHY HEAD AND NECK CT PERFUSION BRAIN TECHNIQUE: Multidetector CT imaging of the head and neck was performed using the standard protocol during bolus administration of intravenous contrast. Multiplanar CT image reconstructions and MIPs were obtained to evaluate the vascular anatomy. Carotid stenosis measurements (when applicable) are obtained utilizing NASCET criteria, using the distal internal carotid diameter as the denominator. Multiphase CT imaging of the brain was performed following IV bolus contrast injection. Subsequent parametric perfusion maps were calculated using RAPID software. CONTRAST:  100 milliliters Isovue 370 COMPARISON:  Head CT without contrast 1338 hr today. FINDINGS: CT Brain Perfusion Findings: CBF (<30%) Volume: 0 milliliters Perfusion (Tmax>6.0s) volume: 0 milliliters Mismatch Volume: Not applicablemL Infarction Location:Not applicable CTA NECK Skeleton: Absent dentition. Cervical spine degeneration. Stable visualized osseous structures. Upper chest: Patchy and dependent pulmonary opacity in the upper lungs is similar to that in December and may reflect a combination of chronic lung disease and atelectasis. No superior mediastinal lymphadenopathy. Other neck: Negative aside from  a partially retropharyngeal course of both carotid arteries. No cervical lymphadenopathy. Aortic arch: 3 vessel arch configuration. Stable mild to moderate arch and great vessel origin calcified plaque. Right carotid system: Mildly increased soft plaque in the ventral brachiocephalic artery since December, or might have been obscured by artifact previously. No significant brachiocephalic artery stenosis. No right CCA origin stenosis. Tortuous proximal right CCA. Mild plaque in the right CCA and at the right carotid bifurcation without stenosis. Partially retropharyngeal but otherwise negative cervical right ICA. Left carotid system: Stable calcified plaque at the left CCA origin without stenosis. Intermittent mild plaque in the left CCA. Minimal calcified plaque at the left ICA origin and bulb without stenosis. Mildly tortuous cervical left ICA with a partially retropharyngeal course. Vertebral arteries: Stable tortuosity of the proximal right subclavian artery with a kinked appearance before the right vertebral artery origin. Stable calcified plaque at the right vertebral artery origin with mild stenosis. The right vertebral artery is patent to the skull base without additional stenosis. No proximal left subclavian artery stenosis despite some soft plaque. Normal left vertebral artery origin. Tortuous left V1 segment with a mildly kinked appearance. Patent left vertebral artery to the skull base without additional stenosis. CTA HEAD Posterior circulation: Codominant distal vertebral arteries. Patent left PICA origin. The right AICA appears dominant. Patent vertebrobasilar junction. Patent basilar artery without stenosis. Normal SCA and left PCA origins. Fetal type right PCA origin. Bilateral PCA branches are within normal limits. Anterior circulation: Both ICA siphons are patent. Mild siphon plaque. No siphon stenosis. A 5 millimeter diameter round medially located saccular aneurysm of the distal left ICA siphon is  stable from the 2018 MRA. This appears to arise from the undersurface of the left supraclinoid segment (paraophthalmic). 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 This aneurysm might incorporate the origin of the right ophthalmic artery, uncertain. Carotid termini are stable and patent. Dominant left ACA A1 segment, the right A1 is diminutive. MCA and ACA origins are normal. Anterior communicating artery and bilateral ACA branches are within normal limits. Left MCA M1 segment, left MCA bifurcation, and left MCA branches are stable and within normal limits. Right MCA M1 segment, bifurcation, and right MCA branches are stable and within normal limits. Venous  sinuses: Patent. Anatomic variants: Dominant left and diminutive right ACA A1 segments. Fetal type right PCA origin. Review of the MIP images confirms the above findings IMPRESSION: 1. Negative for emergent large vessel occlusion. No core infarct or penumbra detected by CTP. Results of #1 were communicated to Dr. Rory Percy at 1411 hr on 1/24/2019by text page via the [facility] messaging system. 2. 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 3. Up to moderate arch and great vessel origin atherosclerosis, but otherwise mild for age atherosclerosis in the head and neck. No significant arterial stenosis identified. 4. Nonspecific patchy opacity in both upper lungs appears similar to that on 01/24/17, perhaps a combination of atelectasis and chronic lung disease. Electronically Signed   By: Genevie Ann M.D.   On: 03/12/2017 14:24   Ct Cerebral Perfusion W Contrast  Result Date: 03/12/2017 CLINICAL DATA:  67 year old female with abnormal speech and bilateral weakness. Bilateral ICA paraophthalmic  aneurysms. EXAM: CT ANGIOGRAPHY HEAD AND NECK CT PERFUSION BRAIN TECHNIQUE: Multidetector CT imaging of the head and neck was performed using the standard protocol during bolus administration of intravenous contrast. Multiplanar CT image reconstructions and MIPs were obtained to evaluate the vascular anatomy. Carotid stenosis measurements (when applicable) are obtained utilizing NASCET criteria, using the distal internal carotid diameter as the denominator. Multiphase CT imaging of the brain was performed following IV bolus contrast injection. Subsequent parametric perfusion maps were calculated using RAPID software. CONTRAST:  100 milliliters Isovue 370 COMPARISON:  Head CT without contrast 1338 hr today. FINDINGS: CT Brain Perfusion Findings: CBF (<30%) Volume: 0 milliliters Perfusion (Tmax>6.0s) volume: 0 milliliters Mismatch Volume: Not applicablemL Infarction Location:Not applicable CTA NECK Skeleton: Absent dentition. Cervical spine degeneration. Stable visualized osseous structures. Upper chest: Patchy and dependent pulmonary opacity in the upper lungs is similar to that in December and may reflect a combination of chronic lung disease and atelectasis. No superior mediastinal lymphadenopathy. Other neck: Negative aside from a partially retropharyngeal course of both carotid arteries. No cervical lymphadenopathy. Aortic arch: 3 vessel arch configuration. Stable mild to moderate arch and great vessel origin calcified plaque. Right carotid system: Mildly increased soft plaque in the ventral brachiocephalic artery since December, or might have been obscured by artifact previously. No significant brachiocephalic artery stenosis. No right CCA origin stenosis. Tortuous proximal right CCA. Mild plaque in the right CCA and at the right carotid bifurcation without stenosis. Partially retropharyngeal but otherwise negative cervical right ICA. Left carotid system: Stable calcified plaque at the left CCA origin without  stenosis. Intermittent mild plaque in the left CCA. Minimal calcified plaque at the left ICA origin and bulb without stenosis. Mildly tortuous cervical left ICA with a partially retropharyngeal course. Vertebral arteries: Stable tortuosity of the proximal right subclavian artery with a kinked appearance before the right vertebral artery origin. Stable calcified plaque at the right vertebral artery origin with mild stenosis. The right vertebral artery is patent to the skull base without additional stenosis. No proximal left subclavian artery stenosis despite some soft plaque. Normal left vertebral artery origin. Tortuous left V1 segment with a mildly kinked appearance. Patent left vertebral artery to the skull base without additional stenosis. CTA HEAD Posterior circulation: Codominant distal vertebral arteries. Patent left PICA origin. The right AICA appears dominant. Patent vertebrobasilar junction. Patent basilar artery without stenosis. Normal SCA and left PCA origins. Fetal type right PCA origin. Bilateral PCA branches are within normal  limits. Anterior circulation: Both ICA siphons are patent. Mild siphon plaque. No siphon stenosis. 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 This appears to arise from the undersurface of the left supraclinoid segment (paraophthalmic). 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 This aneurysm might incorporate the origin of the right ophthalmic artery, uncertain. Carotid termini are stable and patent. Dominant left ACA A1 segment, the right A1 is diminutive. MCA and ACA origins are normal. Anterior communicating artery and bilateral ACA branches are within normal limits. Left MCA M1 segment, left MCA bifurcation, and left MCA branches are stable and within normal  limits. Right MCA M1 segment, bifurcation, and right MCA branches are stable and within normal limits. Venous sinuses: Patent. Anatomic variants: Dominant left and diminutive right ACA A1 segments. Fetal type right PCA origin. Review of the MIP images confirms the above findings IMPRESSION: 1. Negative for emergent large vessel occlusion. No core infarct or penumbra detected by CTP. Results of #1 were communicated to Dr. Rory Percy at 1411 hr on 1/24/2019by text page via the [facility] messaging system. 2. 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 3. Up to moderate arch and great vessel origin atherosclerosis, but otherwise mild for age atherosclerosis in the head and neck. No significant arterial stenosis identified. 4. Nonspecific patchy opacity in both upper lungs appears similar to that on 01/24/17, perhaps a combination of atelectasis and chronic lung disease. Electronically Signed   By: Genevie Ann M.D.   On: 03/12/2017 14:24   Dg Chest Port 1 View  Result Date: 03/12/2017 CLINICAL DATA:  Hypotension EXAM: PORTABLE CHEST 1 VIEW COMPARISON:  06/08/2015 FINDINGS: Cardiac shadow is at the upper limits of normal in size but stable. The lungs are well aerated bilaterally. Elevation of left hemidiaphragm is again noted. No focal infiltrate or sizable effusion is seen. No bony abnormality is noted. IMPRESSION: No acute abnormality noted. Electronically Signed   By: Inez Catalina M.D.   On: 03/12/2017 14:36   Ct Head Code Stroke Wo Contrast  Result Date: 03/12/2017 CLINICAL DATA:  Code stroke. 67 year old female with abnormal speech and bilateral weakness. Left ICA paraophthalmic aneurysm. EXAM: CT HEAD WITHOUT CONTRAST TECHNIQUE: Contiguous axial images were obtained from the base of the skull through the vertex without intravenous contrast. COMPARISON:  CTA neck 01/24/2017. Brain  MRI and MRA 01/24/2017. Head CT 01/23/2017. Sinuses FINDINGS: Brain: Stable cerebral volume. Cavum septum pellucidum, normal variant. No ventriculomegaly. No midline shift, mass effect, or evidence of intracranial mass lesion. Patchy bilateral white matter hypodensity is stable. No acute intracranial hemorrhage identified. No cortically based acute infarct identified. Vascular: Calcified atherosclerosis at the skull base. No suspicious intracranial vascular hyperdensity. Skull: No acute osseous abnormality identified. Hyperostosis frontalis, normal variant. Sinuses/Orbits: Stable and negative. Other: Stable and negative. ASPECTS ([place] Stroke Program Early CT Score) - Ganglionic level infarction (caudate, lentiform nuclei, internal capsule, insula, M1-M3 cortex): 7 - Supraganglionic infarction (M4-M6 cortex): 3 Total score (0-10 with 10 being normal): 10 IMPRESSION: 1. Stable non contrast CT appearance of the brain since December 2018. 2. ASPECTS is 10. 3. These results were communicated to Dr. Rory Percy at [facility] 1/24/2019by text page via the  AMION messaging system. Electronically Signed   By: Genevie Ann M.D.   On: 03/12/2017 13:46    EKG: Independently reviewed. Sinus rhythm  Assessment/Plan Principal Problem:   Hypotension Active Problems:   Bipolar I disorder (HCC)   Essential hypertension   Hypothyroidism   HLD (hyperlipidemia)   Chronic back pain   OSA (obstructive sleep apnea)   Mitral regurgitation   Chronic diastolic CHF (congestive heart failure) (HCC)   Intracranial aneurysm   Slurred speech   #1. Hypotension. Etiology unclear. No signs symptoms of infectious process. No metabolic derangement. Spoke with daughter who opines may be related to this management of medications. She is provided with 2 L of normal saline. Blood pressure 103/62 on admission. Urine drug screen positive for opiates and benzos -Admit to telemetry -Continue IV fluids -Hold sedating medications -Monitor  #2.  biLateral ICA aneurysms. Evaluated by interventional radiology. CT angio of head and neck revealing stable aneurysms. Plan is to proceed with angio/intervention tomorrow -Nothing by mouth past midnight -Plavix and aspirin tomorrow morning preprocedure per IR -PT and OT to evaluate for possible home health/placement needs after procedure  #3. Slurred speech/generalized weakness. Symptoms resolved. Initial CT reveals no acute process. A stroke workup last month. Neurology evaluated and opined inconsistent exam with weakness initially on the right side than on the left and doubts symptoms consistent with an acute ischemic stroke. likely related to this management of medications as noted above. Drug screen positive for opiates and benzos. No signs symptoms of infectious process. No metabolic derangement. Spoke with Dr.Arora who recommends no further stroke work up as complete work up done last month.  -Physical therapy -Continue Plavix and aspirin -Continue statin -See above  #4. Chronic diastolic heart failure. Appears compensated. Recent echo reveals an EF of 65% and mild LVH. -Daily weights -Intake and output  #5. hypothyroidism. Home medications include employed 192mcg. TSH greater than 10. Likely related to noncompliance/mismanagement of home meds -Continue Synthroid -Will need close outpatient follow-up  #6. Bipolar disorder. Ears stable at baseline -Continue home meds    DVT prophylaxis: plavix  Code Status: full Family Communication: none present  Disposition Plan: may need placement  Consults called: deveshwar IR [facility] neurology  Admission status: obs    Dyanne Carrel M MD [facility]  If 7PM-7AM, please contact night-coverage [email or web address] Password [facility]  03/12/2017, 4:26 PM

## 2017-03-12 NOTE — Progress Notes (Signed)
Lab unable to draw labs --platlet inhibition or lactic acid, attempted but pt has no veins to draw from lab states, poor access. Message texted to Dr Evangeline Gula [facility] MD to notify them of this.

## 2017-03-12 NOTE — ED Triage Notes (Signed)
Pt arrives via EMS for eval of reported R sided weakness and slurred speech. Onset @ 1249 per EMS. Pt states she noted her speech to be slurred after a phone call and and states "I couldn't walk". On exam, pt noted to be weak to L side w/ minimal slurred speech. Due to have 2 aneurysms clipped today but surgery was cancelled due to after care concerns

## 2017-03-12 NOTE — Telephone Encounter (Signed)
Patient called in with "slurred speech." She said "I can't move, I'm confused." I asked when did this happen, she said "about 15 minutes ago when I got a phone call is when I noticed my speech, it's embarrassing." I told patient I would call 911 to send an ambulance to her, she said 'ok." I conference in the 911 operator with the patient after verifying the patient's address, age, onset of symptoms.  Reason for Disposition . Stroke suspected (e.g., sudden onset of weakness of the face, arm or leg on one side of the body)  Answer Assessment - Initial Assessment Questions 1. ACUTE COMPLAINT: "What is the main problem?" "Tell me what happened?"     Slurred speech, can't move, confused 2. AIRWAY: "Is he / she breathing?" (Yes, No, Unknown)     Yes 3. BREATHING: "Is there difficulty breathing?" (Yes, No, Unknown)     No 4. CIRCULATION: "Is there any bleeding?" (Yes, No, Unknown)     No 5. CONSCIOUS: "Is he/she awake, alert, and responding to you?" (Yes, No, Unknown)     Yes  Protocols used: 911 [facility]

## 2017-03-12 NOTE — ED Provider Notes (Signed)
[place] EMERGENCY DEPARTMENT Provider Note   CSN: [id number] Arrival date & time: 03/12/17  1312   An emergency department physician performed an initial assessment on this suspected stroke patient at 1313.  History   Chief Complaint Chief Complaint  Patient presents with  . Cerebrovascular Accident    HPI Kelsey Hanna is a 67 y.o. female history of diabetes, previous ICA aneurysm, bipolar here presenting with slurred speech, trouble walking.  Around 12:50 pm, she has acute onset of slurred speech and was unable to walk so was able to crawl.  She was supposed to have her aneurysms clipped yesterday but she does not have any aftercare after the surgery so the procedure was postponed.  She was admitted for stroke workup about a month ago and had no stroke but just cerebral aneurysm.  Code stroke was activated by EMS due to slurred speech.   The history is provided by the patient.    Past Medical History:  Diagnosis Date  . Anxiety   . Bipolar 1 disorder ([place])   . Cancer [facility])    cancer of uterus...no chemo or radiation  . Chicken pox   . Depression   . Diabetes mellitus    dx within last yr....just takes pills  . Dyslipidemia   . Hypothyroid   . Retinal detachment   . Sleep apnea     Patient Active Problem List   Diagnosis Date Noted  . Dysarthria   . Left sided numbness   . Acute left-sided muscle weakness 01/24/2017  . 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 Intracranial aneurysm 01/24/2017  . Encounter for medication review 02/07/2016  . Hypoxia, sleep related 09/07/2015  . Constipation 07/30/2015  . Dizziness 07/30/2015  . Chronic diastolic CHF (congestive heart failure) ([place]) 06/26/2015  . Bilateral lower extremity edema 06/26/2015  . Mitral regurgitation 05/17/2015  . Accidental drug overdose 05/17/2015  . Respiratory failure with hypoxia and hypercapnia ([place]) 05/09/2015  . OSA (obstructive sleep apnea) 05/09/2015  . Overdose  05/09/2015  . Chronic back pain 03/20/2015  . Right shoulder pain 11/14/2014  . Insomnia 11/14/2014  . Psychoses ([place])   . Bacteremia   . Pedal edema 09/12/2014  . Bipolar disorder, current episode manic without psychotic features, severe ([place])   . Bipolar disorder ([place]) 09/05/2014  . Bipolar affective disorder, current episode manic with psychotic symptoms ([place]) 09/05/2014  . Bipolar affective disorder, manic ([place]) 09/03/2014  . Encounter for preadmission testing   . Head revolving around 07/27/2014  . BP (high blood pressure) 05/23/2014  . AF (paroxysmal atrial fibrillation) ([place]) 05/23/2014  . Type 2 diabetes mellitus ([place]) 04/27/2014  . Essential hypertension 04/27/2014  . Hypothyroidism 04/27/2014  . Tobacco abuse 04/27/2014  . Bipolar I disorder, most recent episode (or current) unspecified 04/20/2014  . Acute encephalopathy 04/18/2014  . Acute respiratory failure ([place])   . Encounter for feeding tube placement   . Shortness of breath   . Gout 08/25/2013  . 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 HLD (hyperlipidemia) 08/30/2009    Past Surgical History:  Procedure Laterality Date  . ABDOMINAL HYSTERECTOMY    . IR RADIOLOGIST EVAL & MGMT  02/19/2017  . LUMBAR LAMINECTOMY/DECOMPRESSION MICRODISCECTOMY  07/17/2011   Procedure: LUMBAR LAMINECTOMY/DECOMPRESSION MICRODISCECTOMY;  Surgeon: Sinclair Ship, MD;  Location: [facility];  Service: Orthopedics;  Laterality: Left;  Left sided lumbar 4-5 microdisectomy  . TONSILLECTOMY    . TOTAL ABDOMINAL HYSTERECTOMY W/ BILATERAL SALPINGOOPHORECTOMY      OB History  No data available       Home Medications    Prior to Admission medications   Medication Sig Start Date End Date Taking? Authorizing Provider  aspirin 325 MG tablet Take 1 tablet (325 mg total) by mouth daily. 01/24/17   Aline August, MD  atorvastatin (LIPITOR) 40 MG tablet Take 1 tablet (40 mg total) by mouth every evening. 03/04/17   [name], FNP    clonazePAM (KLONOPIN) 0.5 MG tablet Take 0.5 mg by mouth 2 (two) times daily. 1 in the morning and 1 midday    [provider]  clopidogrel (PLAVIX) 75 MG tablet Take 75 mg by mouth daily.    [provider]  diphenhydrAMINE (BENADRYL) 25 MG tablet Take 25 mg by mouth daily.    [provider]  DOCQLACE 100 MG capsule TAKE 1 TO 2 CAPSULES BY MOUTH TWICE A DAY AS NEEDED FOR MILD CONSTIPATION 11/11/16   [place] Circle, FNP  gabapentin (NEURONTIN) 100 MG capsule Take 100 mg by mouth 3 (three) times daily.    [provider]  levothyroxine (SYNTHROID, LEVOTHROID) 150 MCG tablet TAKE 1 TABLET BY MOUTH DAILY BEFORE BREAKFAST 03/06/17   [name], FNP  linaclotide [facility]) 145 MCG CAPS capsule Take 1 capsule (145 mcg total) by mouth daily before breakfast. 03/04/17   [name], FNP  OLANZapine (ZYPREXA) 20 MG tablet Take 20 mg by mouth at bedtime.  02/11/17   [provider]  Oxycodone HCl 10 MG TABS Take 10 mg by mouth every 6 (six) hours as needed (pain).    [provider]  tiZANidine (ZANAFLEX) 4 MG tablet Take 4 mg by mouth every 8 (eight) hours as needed for muscle spasms.    [provider]  zolpidem (AMBIEN) 5 MG tablet Take 1 tablet (5 mg total) by mouth at bedtime as needed for sleep. 03/06/17   [name], FNP    Family History Family History  Problem Relation Age of Onset  . Healthy Mother   . Healthy Father     Social History Social History   Tobacco Use  . Smoking status: Current Every Day Smoker    Packs/day: 0.25    Years: 35.00    Pack years: 8.75    Types: Cigarettes    Last attempt to quit: 07/09/1999    Years since quitting: 17.6  . Smokeless tobacco: Never Used  Substance Use Topics  . Alcohol use: No    Alcohol/week: 0.0 oz    Comment: socially  . Drug use: No     Allergies   Patient has no known allergies.   Review of Systems Review of Systems   Neurological: Positive for speech difficulty and weakness.  All other systems reviewed and are negative.    Physical Exam Updated Vital Signs BP (!) 88/48 (BP Location: Right Arm)   Pulse 64   Temp 98 F (36.7 C) (Oral)   Resp 14   Ht 5\' 3"  (1.6 m)   Wt 90.7 kg (200 lb)   SpO2 92%   BMI 35.43 kg/m   Physical Exam  Constitutional: She is oriented to person, place, and time.  Chronically ill appearing   HENT:  Head: Normocephalic.  Mouth/Throat: Oropharynx is clear and moist.  Eyes: Conjunctivae and EOM are normal. Pupils are equal, round, and reactive to light.  Neck: Normal range of motion. Neck supple.  No obvious bruit   Cardiovascular: Normal rate, regular rhythm and normal heart sounds.  Pulmonary/Chest: Effort normal and breath sounds normal. No stridor. No respiratory distress. She has no wheezes.  Abdominal: Soft. Bowel sounds are normal. She exhibits no distension. There is no tenderness. There is no guarding.  Musculoskeletal: Normal range of motion.  2+ pulses bilaterally   Neurological: She is alert and oriented to person, place, and time.  ? Mild slurred speech, strength 4/5 L side, 5/5 R side.   Skin: Skin is warm.  Psychiatric: She has a normal mood and affect.  Nursing note and vitals reviewed.    ED Treatments / Results  Labs (all labs ordered are listed, but only abnormal results are displayed) Labs Reviewed  CBC - Abnormal; Notable for the following components:      Result Value   Hemoglobin 11.3 (*)    HCT 35.6 (*)    All other components within normal limits  URINALYSIS, ROUTINE W REFLEX MICROSCOPIC - Abnormal; Notable for the following components:   APPearance HAZY (*)    All other components within normal limits  I-STAT CHEM 8, ED - Abnormal; Notable for the following components:   Glucose, Bld 100 (*)    All other components within normal limits  CULTURE, BLOOD (ROUTINE X 2)  CULTURE, BLOOD (ROUTINE X 2)  PROTIME-INR  APTT   DIFFERENTIAL  COMPREHENSIVE METABOLIC PANEL  RAPID URINE DRUG SCREEN, HOSP PERFORMED  I-STAT TROPONIN, ED  CBG MONITORING, ED  I-STAT CG4 LACTIC ACID, ED    EKG  EKG Interpretation None       Radiology Ct Angio Head W Or Wo Contrast  Result Date: 03/12/2017 CLINICAL DATA:  67 year old female with abnormal speech and bilateral weakness. Bilateral ICA paraophthalmic aneurysms. EXAM: CT ANGIOGRAPHY HEAD AND NECK CT PERFUSION BRAIN TECHNIQUE: Multidetector CT imaging of the head and neck was performed using the standard protocol during bolus administration of intravenous contrast. Multiplanar CT image reconstructions and MIPs were obtained to evaluate the vascular anatomy. Carotid stenosis measurements (when applicable) are obtained utilizing NASCET criteria, using the distal internal carotid diameter as the denominator. Multiphase CT imaging of the brain was performed following IV bolus contrast injection. Subsequent parametric perfusion maps were calculated using RAPID software. CONTRAST:  100 milliliters Isovue 370 COMPARISON:  Head CT without contrast 1338 hr today. FINDINGS: CT Brain Perfusion Findings: CBF (<30%) Volume: 0 milliliters Perfusion (Tmax>6.0s) volume: 0 milliliters Mismatch Volume: Not applicablemL Infarction Location:Not applicable CTA NECK Skeleton: Absent dentition. Cervical spine degeneration. Stable visualized osseous structures. Upper chest: Patchy and dependent pulmonary opacity in the upper lungs is similar to that in December and may reflect a combination of chronic lung disease and atelectasis. No superior mediastinal lymphadenopathy. Other neck: Negative aside from a partially retropharyngeal course of both carotid arteries. No cervical lymphadenopathy. Aortic arch: 3 vessel arch configuration. Stable mild to moderate arch and great vessel origin calcified plaque. Right carotid system: Mildly increased soft plaque in the ventral brachiocephalic artery since December, or  might have been obscured by artifact previously. No significant brachiocephalic artery stenosis. No right CCA origin stenosis. Tortuous proximal right CCA. Mild plaque in the right CCA and at the right carotid bifurcation without stenosis. Partially retropharyngeal but otherwise negative cervical right ICA. Left carotid system: Stable calcified plaque at the left CCA origin without stenosis. Intermittent mild plaque in the left CCA. Minimal calcified plaque at the left ICA origin and bulb without stenosis. Mildly tortuous cervical left ICA with a partially retropharyngeal course. Vertebral arteries: Stable tortuosity of the proximal right subclavian artery with a kinked  appearance before the right vertebral artery origin. Stable calcified plaque at the right vertebral artery origin with mild stenosis. The right vertebral artery is patent to the skull base without additional stenosis. No proximal left subclavian artery stenosis despite some soft plaque. Normal left vertebral artery origin. Tortuous left V1 segment with a mildly kinked appearance. Patent left vertebral artery to the skull base without additional stenosis. CTA HEAD Posterior circulation: Codominant distal vertebral arteries. Patent left PICA origin. The right AICA appears dominant. Patent vertebrobasilar junction. Patent basilar artery without stenosis. Normal SCA and left PCA origins. Fetal type right PCA origin. Bilateral PCA branches are within normal limits. Anterior circulation: Both ICA siphons are patent. Mild siphon plaque. No siphon stenosis. 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 This appears to arise from the undersurface of the left supraclinoid segment (paraophthalmic). 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 This  aneurysm might incorporate the origin of the right ophthalmic artery, uncertain. Carotid termini are stable and patent. Dominant left ACA A1 segment, the right A1 is diminutive. MCA and ACA origins are normal. Anterior communicating artery and bilateral ACA branches are within normal limits. Left MCA M1 segment, left MCA bifurcation, and left MCA branches are stable and within normal limits. Right MCA M1 segment, bifurcation, and right MCA branches are stable and within normal limits. Venous sinuses: Patent. Anatomic variants: Dominant left and diminutive right ACA A1 segments. Fetal type right PCA origin. Review of the MIP images confirms the above findings IMPRESSION: 1. Negative for emergent large vessel occlusion. No core infarct or penumbra detected by CTP. Results of #1 were communicated to Dr. Rory Percy at 1411 hr on 1/24/2019by text page via the [facility] messaging system. 2. 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 3. Up to moderate arch and great vessel origin atherosclerosis, but otherwise mild for age atherosclerosis in the head and neck. No significant arterial stenosis identified. 4. Nonspecific patchy opacity in both upper lungs appears similar to that on 01/24/17, perhaps a combination of atelectasis and chronic lung disease. Electronically Signed   By: Genevie Ann M.D.   On: 03/12/2017 14:24   Ct Angio Neck W Or Wo Contrast  Result Date: 03/12/2017 CLINICAL DATA:  67 year old female with abnormal speech and bilateral weakness. Bilateral ICA paraophthalmic aneurysms. EXAM: CT ANGIOGRAPHY HEAD AND NECK CT PERFUSION BRAIN TECHNIQUE: Multidetector CT imaging of the head and neck was performed using the standard protocol during bolus administration of intravenous contrast. Multiplanar CT image reconstructions and MIPs were obtained to evaluate the vascular anatomy.  Carotid stenosis measurements (when applicable) are obtained utilizing NASCET criteria, using the distal internal carotid diameter as the denominator. Multiphase CT imaging of the brain was performed following IV bolus contrast injection. Subsequent parametric perfusion maps were calculated using RAPID software. CONTRAST:  100 milliliters Isovue 370 COMPARISON:  Head CT without contrast 1338 hr today. FINDINGS: CT Brain Perfusion Findings: CBF (<30%) Volume: 0 milliliters Perfusion (Tmax>6.0s) volume: 0 milliliters Mismatch Volume: Not applicablemL Infarction Location:Not applicable CTA NECK Skeleton: Absent dentition. Cervical spine degeneration. Stable visualized osseous structures. Upper chest: Patchy and dependent pulmonary opacity in the upper lungs is similar to that in  December and may reflect a combination of chronic lung disease and atelectasis. No superior mediastinal lymphadenopathy. Other neck: Negative aside from a partially retropharyngeal course of both carotid arteries. No cervical lymphadenopathy. Aortic arch: 3 vessel arch configuration. Stable mild to moderate arch and great vessel origin calcified plaque. Right carotid system: Mildly increased soft plaque in the ventral brachiocephalic artery since December, or might have been obscured by artifact previously. No significant brachiocephalic artery stenosis. No right CCA origin stenosis. Tortuous proximal right CCA. Mild plaque in the right CCA and at the right carotid bifurcation without stenosis. Partially retropharyngeal but otherwise negative cervical right ICA. Left carotid system: Stable calcified plaque at the left CCA origin without stenosis. Intermittent mild plaque in the left CCA. Minimal calcified plaque at the left ICA origin and bulb without stenosis. Mildly tortuous cervical left ICA with a partially retropharyngeal course. Vertebral arteries: Stable tortuosity of the proximal right subclavian artery with a kinked appearance before  the right vertebral artery origin. Stable calcified plaque at the right vertebral artery origin with mild stenosis. The right vertebral artery is patent to the skull base without additional stenosis. No proximal left subclavian artery stenosis despite some soft plaque. Normal left vertebral artery origin. Tortuous left V1 segment with a mildly kinked appearance. Patent left vertebral artery to the skull base without additional stenosis. CTA HEAD Posterior circulation: Codominant distal vertebral arteries. Patent left PICA origin. The right AICA appears dominant. Patent vertebrobasilar junction. Patent basilar artery without stenosis. Normal SCA and left PCA origins. Fetal type right PCA origin. Bilateral PCA branches are within normal limits. Anterior circulation: Both ICA siphons are patent. Mild siphon plaque. No siphon stenosis. 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 This appears to arise from the undersurface of the left supraclinoid segment (paraophthalmic). 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 This aneurysm might incorporate the origin of the right ophthalmic artery, uncertain. Carotid termini are stable and patent. Dominant left ACA A1 segment, the right A1 is diminutive. MCA and ACA origins are normal. Anterior communicating artery and bilateral ACA branches are within normal limits. Left MCA M1 segment, left MCA bifurcation, and left MCA branches are stable and within normal limits. Right MCA M1 segment, bifurcation, and right MCA branches are stable and within normal limits. Venous sinuses: Patent. Anatomic variants: Dominant left and diminutive right ACA A1 segments. Fetal type right PCA origin. Review of the MIP images confirms the above findings IMPRESSION: 1. Negative for  emergent large vessel occlusion. No core infarct or penumbra detected by CTP. Results of #1 were communicated to Dr. Rory Percy at 1411 hr on 1/24/2019by text page via the [facility] messaging system. 2. 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 3. Up to moderate arch and great vessel origin atherosclerosis, but otherwise mild for age atherosclerosis in the head and neck. No significant arterial stenosis identified. 4. Nonspecific patchy opacity in both upper lungs appears similar to that on 01/24/17, perhaps a combination of atelectasis and chronic lung disease. Electronically Signed   By: Genevie Ann M.D.   On: 03/12/2017 14:24   Ct Cerebral Perfusion W Contrast  Result Date: 03/12/2017 CLINICAL DATA:  67 year old  female with abnormal speech and bilateral weakness. Bilateral ICA paraophthalmic aneurysms. EXAM: CT ANGIOGRAPHY HEAD AND NECK CT PERFUSION BRAIN TECHNIQUE: Multidetector CT imaging of the head and neck was performed using the standard protocol during bolus administration of intravenous contrast. Multiplanar CT image reconstructions and MIPs were obtained to evaluate the vascular anatomy. Carotid stenosis measurements (when applicable) are obtained utilizing NASCET criteria, using the distal internal carotid diameter as the denominator. Multiphase CT imaging of the brain was performed following IV bolus contrast injection. Subsequent parametric perfusion maps were calculated using RAPID software. CONTRAST:  100 milliliters Isovue 370 COMPARISON:  Head CT without contrast 1338 hr today. FINDINGS: CT Brain Perfusion Findings: CBF (<30%) Volume: 0 milliliters Perfusion (Tmax>6.0s) volume: 0 milliliters Mismatch Volume: Not applicablemL Infarction Location:Not applicable CTA NECK Skeleton: Absent dentition. Cervical spine degeneration. Stable visualized osseous structures.  Upper chest: Patchy and dependent pulmonary opacity in the upper lungs is similar to that in December and may reflect a combination of chronic lung disease and atelectasis. No superior mediastinal lymphadenopathy. Other neck: Negative aside from a partially retropharyngeal course of both carotid arteries. No cervical lymphadenopathy. Aortic arch: 3 vessel arch configuration. Stable mild to moderate arch and great vessel origin calcified plaque. Right carotid system: Mildly increased soft plaque in the ventral brachiocephalic artery since December, or might have been obscured by artifact previously. No significant brachiocephalic artery stenosis. No right CCA origin stenosis. Tortuous proximal right CCA. Mild plaque in the right CCA and at the right carotid bifurcation without stenosis. Partially retropharyngeal but otherwise negative cervical right ICA. Left carotid system: Stable calcified plaque at the left CCA origin without stenosis. Intermittent mild plaque in the left CCA. Minimal calcified plaque at the left ICA origin and bulb without stenosis. Mildly tortuous cervical left ICA with a partially retropharyngeal course. Vertebral arteries: Stable tortuosity of the proximal right subclavian artery with a kinked appearance before the right vertebral artery origin. Stable calcified plaque at the right vertebral artery origin with mild stenosis. The right vertebral artery is patent to the skull base without additional stenosis. No proximal left subclavian artery stenosis despite some soft plaque. Normal left vertebral artery origin. Tortuous left V1 segment with a mildly kinked appearance. Patent left vertebral artery to the skull base without additional stenosis. CTA HEAD Posterior circulation: Codominant distal vertebral arteries. Patent left PICA origin. The right AICA appears dominant. Patent vertebrobasilar junction. Patent basilar artery without stenosis. Normal SCA and left PCA origins. Fetal type right PCA  origin. Bilateral PCA branches are within normal limits. Anterior circulation: Both ICA siphons are patent. Mild siphon plaque. No siphon stenosis. 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 This appears to arise from the undersurface of the left supraclinoid segment (paraophthalmic). 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 This aneurysm might incorporate the origin of the right ophthalmic artery, uncertain. Carotid termini are stable and patent. Dominant left ACA A1 segment, the right A1 is diminutive. MCA and ACA origins are normal. Anterior communicating artery and bilateral ACA branches are within normal limits. Left MCA M1 segment, left MCA bifurcation, and left MCA branches are stable and within normal limits. Right MCA M1 segment, bifurcation, and right MCA branches are stable and within normal limits. Venous sinuses: Patent. Anatomic variants: Dominant left and  diminutive right ACA A1 segments. Fetal type right PCA origin. Review of the MIP images confirms the above findings IMPRESSION: 1. Negative for emergent large vessel occlusion. No core infarct or penumbra detected by CTP. Results of #1 were communicated to Dr. Rory Percy at 1411 hr on 1/24/2019by text page via the [facility] messaging system. 2. 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 3. Up to moderate arch and great vessel origin atherosclerosis, but otherwise mild for age atherosclerosis in the head and neck. No significant arterial stenosis identified. 4. Nonspecific patchy opacity in both upper lungs appears similar to that on 01/24/17, perhaps a combination of atelectasis and chronic lung disease.  Electronically Signed   By: Genevie Ann M.D.   On: 03/12/2017 14:24   Dg Chest Port 1 View  Result Date: 03/12/2017 CLINICAL DATA:  Hypotension EXAM: PORTABLE CHEST 1 VIEW COMPARISON:  06/08/2015 FINDINGS: Cardiac shadow is at the upper limits of normal in size but stable. The lungs are well aerated bilaterally. Elevation of left hemidiaphragm is again noted. No focal infiltrate or sizable effusion is seen. No bony abnormality is noted. IMPRESSION: No acute abnormality noted. Electronically Signed   By: Inez Catalina M.D.   On: 03/12/2017 14:36   Ct Head Code Stroke Wo Contrast  Result Date: 03/12/2017 CLINICAL DATA:  Code stroke. 67 year old female with abnormal speech and bilateral weakness. Left ICA paraophthalmic aneurysm. EXAM: CT HEAD WITHOUT CONTRAST TECHNIQUE: Contiguous axial images were obtained from the base of the skull through the vertex without intravenous contrast. COMPARISON:  CTA neck 01/24/2017. Brain MRI and MRA 01/24/2017. Head CT 01/23/2017. Sinuses FINDINGS: Brain: Stable cerebral volume. Cavum septum pellucidum, normal variant. No ventriculomegaly. No midline shift, mass effect, or evidence of intracranial mass lesion. Patchy bilateral white matter hypodensity is stable. No acute intracranial hemorrhage identified. No cortically based acute infarct identified. Vascular: Calcified atherosclerosis at the skull base. No suspicious intracranial vascular hyperdensity. Skull: No acute osseous abnormality identified. Hyperostosis frontalis, normal variant. Sinuses/Orbits: Stable and negative. Other: Stable and negative. ASPECTS ([place] Stroke Program Early CT Score) - Ganglionic level infarction (caudate, lentiform nuclei, internal capsule, insula, M1-M3 cortex): 7 - Supraganglionic infarction (M4-M6 cortex): 3 Total score (0-10 with 10 being normal): 10 IMPRESSION: 1. Stable non contrast CT appearance of the brain since December 2018. 2. ASPECTS is 10. 3. These results were communicated to Dr.  Rory Percy at [place] 1/24/2019by text page via the [facility] messaging system. Electronically Signed   By: Genevie Ann M.D.   On: 03/12/2017 13:46    Procedures Procedures (including critical care time)  Medications Ordered in ED Medications  sodium chloride 0.9 % bolus 1,000 mL (not administered)  sodium chloride 0.9 % bolus 1,000 mL (1,000 mLs Intravenous New Bag/Given 03/12/17 1407)     Initial Impression / Assessment and Plan / ED Course  I have reviewed the triage vital signs and the nursing notes.  Pertinent labs & imaging results that were available during my care of the patient were reviewed by me and considered in my medical decision making (see chart for details).    Kelsey Hanna is a 67 y.o. female here with code stroke. Had previous ICA aneurysm. Consider aneurysm rupture vs ischemic stroke. Patient hypotensive but has no fever or other infectious symptoms. Seen by neurology, no TPA due to aneurysms and improving symptoms. NIH was 4 on arrival. Will get CT head/angio  head/neck, perfusion study. Will do sepsis workup for hypotension.   2:48 PM BP still 80s. WBC nl, lactate nl. UA nl. CTA showed known aneurysm with no dissection. I talked to Dr. Rory Percy from neurology. He discussed case with Dr. Estanislado Pandy, who will coil the aneurysm tomorrow. Will admit for hypotension, near syncope. Patient has no fever or infectious source currently so will obtain blood cultures, hold abx for now. Hospitalist to admit.      Final Clinical Impressions(s) / ED Diagnoses   Final diagnoses:  None    ED Discharge Orders    None       Drenda Freeze, MD 03/12/17 [id number]

## 2017-03-12 NOTE — Anesthesia Preprocedure Evaluation (Signed)
Anesthesia Evaluation  Patient identified by MRN, date of birth, ID band Patient awake and Patient confused    Reviewed: Allergy & Precautions, H&P , NPO status , Patient's Chart, lab work & pertinent test results, reviewed documented beta blocker date and time   Airway Mallampati: II  TM Distance: >3 FB Neck ROM: Full    Dental no notable dental hx. (+) Edentulous Lower, Edentulous Upper   Pulmonary neg pulmonary ROS, sleep apnea , Current Smoker,    Pulmonary exam normal breath sounds clear to auscultation       Cardiovascular hypertension, Pt. on medications + Peripheral Vascular Disease and +CHF  Normal cardiovascular exam Rhythm:Regular Rate:Normal     Neuro/Psych Bipolar Disorder negative neurological ROS  negative psych ROS   GI/Hepatic negative GI ROS, Neg liver ROS,   Endo/Other  diabetes, Type 2, Oral Hypoglycemic AgentsHypothyroidism   Renal/GU negative Renal ROS     Musculoskeletal negative musculoskeletal ROS (+)   Abdominal   Peds  Hematology negative hematology ROS (+)   Anesthesia Other Findings   Reproductive/Obstetrics negative OB ROS                             Anesthesia Physical  Anesthesia Plan  ASA: III  Anesthesia Plan: General   Post-op Pain Management:    Induction: Intravenous  PONV Risk Score and Plan: 2 and Ondansetron and Dexamethasone  Airway Management Planned: Oral ETT  Additional Equipment: Arterial line  Intra-op Plan:   Post-operative Plan: Possible Post-op intubation/ventilation  Informed Consent: I have reviewed the patients History and Physical, chart, labs and discussed the procedure including the risks, benefits and alternatives for the proposed anesthesia with the patient or authorized representative who has indicated his/her understanding and acceptance.   Dental advisory given  Plan Discussed with: CRNA  Anesthesia Plan  Comments:         Anesthesia Quick Evaluation

## 2017-03-12 NOTE — Code Documentation (Signed)
67yo female arriving to [facility] via [place] at 1312. Patient from home where she reports having difficulty walking at 1249.  EMS reports right sided weakness and slurred speech.  LKW unclear as patient is unable to recall how long she had been sitting in the chair.  Of note, plans for procedure d/t cerebral aneurysm in 2 days that was canceled per patient.  Stroke team at the bedside on patient arrival.  Labs attempted x 2 and patient cleared for CT by Dr. Darl Householder.  Patient to CT with team.  CT completed.  NIHSS 4, see documentation for details and code stroke times.  Patient with left arm drift and left leg weakness on exam.  Inconsistent sensory exam. Difficulty placing PIV requiring EDP to place ultrasound-guided PIV for CTA and CTP.  Imaging completed. No acute stroke treatment at this time.  Code stroke canceled.  Bedside handoff with ED RN Loma Sousa.

## 2017-03-12 NOTE — Telephone Encounter (Signed)
Kelsey Hanna at [place] to discuss Kelsey Hanna elective aneurysm treatment. Kelsey Hanna informed me that the PCP office could not assume the after care responsibility for an elective surgery for Kelsey Hanna. We will continue to try to arrange this postop care otherwise. JM

## 2017-03-12 NOTE — Consult Note (Addendum)
Neurology Consultation  Reason for Consult: Acute code stroke Referring Physician: Dr. Shirlyn Goltz  CC: Left-sided weakness, right-sided weakness, slurred speech  History is obtained from: Patient  HPI: Kelsey Hanna is a 67 y.o. 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  She initially said she had right-sided symptoms but on arrival she complained of left-sided weakness. She said her symptoms started precisely at 12:49 PM, following which she called EMS. She is a poor historian and could not reliably give [place] times of onset or the actual nature of the problem. Given that she does have cerebrovascular risk factors, she was taken for stat noncontrast CT of the head that was unrevealing for any acute process. She also underwent CT angiogram of the head and neck and a CT perfusion study, which essentially also showed unchanged aneurysms as mentioned above in the right and left ICA and no acute occlusion.  Of note the patient was scheduled to see interventional radiology Dr. Estanislado Pandy, for treatment of the larger 9 mm right ICA aneurysm but had to cancel because she had no after surgery care available.  She has a case Freight forwarder working towards that.  LKW: Unclear -12:50 PM on 03/12/2017 to the best of our knowledge tpa given?: no, inconsistent history Premorbid modified Rankin scale (mRS): 2  ROS: ROS was performed and is negative except as noted in the HPI.  Past Medical History:  Diagnosis Date  . Anxiety   . Bipolar 1 disorder ([place])   . Cancer [facility])    cancer of uterus...no chemo or radiation  . Chicken pox   . Depression   . Diabetes mellitus    dx within last yr....just takes pills  .  Dyslipidemia   . Hypothyroid   . Retinal detachment   . Sleep apnea    Family History  Problem Relation Age of Onset  . Healthy Mother   . Healthy Father     Social History:   reports that she has been smoking cigarettes.  She has a 8.75 pack-year smoking history. she has never used smokeless tobacco. She reports that she does not drink alcohol or use drugs.  Medications No current facility-administered medications for this encounter.   Current Outpatient Medications:  .  aspirin 325 MG tablet, Take 1 tablet (325 mg total) by mouth daily., Disp: 30 tablet, Rfl: 0 .  atorvastatin (LIPITOR) 40 MG tablet, Take 1 tablet (40 mg total) by mouth every evening., Disp: 90 tablet, Rfl: 1 .  clonazePAM (KLONOPIN) 0.5 MG tablet, Take 0.5 mg by mouth 2 (two) times daily. 1 in the morning and 1 midday, Disp: , Rfl:  .  clopidogrel (PLAVIX) 75 MG tablet, Take 75 mg by mouth daily., Disp: , Rfl:  .  diphenhydrAMINE (BENADRYL) 25 MG tablet, Take 25 mg by mouth daily., Disp: , Rfl:  .  DOCQLACE 100 MG capsule, TAKE 1 TO 2 CAPSULES BY MOUTH TWICE A DAY AS NEEDED FOR MILD CONSTIPATION, Disp: 60 capsule, Rfl: 2 .  gabapentin (NEURONTIN) 100 MG capsule, Take 100 mg by mouth 3 (three) times daily., Disp: , Rfl:  .  levothyroxine (SYNTHROID, LEVOTHROID) 150 MCG tablet, TAKE 1 TABLET BY MOUTH DAILY BEFORE BREAKFAST, Disp: 90 tablet, Rfl: 0 .  linaclotide (LINZESS) 145  MCG CAPS capsule, Take 1 capsule (145 mcg total) by mouth daily before breakfast., Disp: 90 capsule, Rfl: 1 .  OLANZapine (ZYPREXA) 20 MG tablet, Take 20 mg by mouth at bedtime. , Disp: , Rfl:  .  Oxycodone HCl 10 MG TABS, Take 10 mg by mouth every 6 (six) hours as needed (pain)., Disp: , Rfl:  .  tiZANidine (ZANAFLEX) 4 MG tablet, Take 4 mg by mouth every 8 (eight) hours as needed for muscle spasms., Disp: , Rfl:  .  zolpidem (AMBIEN) 5 MG tablet, Take 1 tablet (5 mg total) by mouth at bedtime as needed for sleep., Disp: 30 tablet, Rfl:  1  Exam: Current vital signs: BP (!) 88/48 (BP Location: Right Arm)   Pulse 64   Temp 98 F (36.7 C) (Oral)   Resp 14   Ht 5\' 3"  (1.6 m)   Wt 90.7 kg (200 lb)   SpO2 92%   BMI 35.43 kg/m  Vital signs in last 24 hours: Temp:  [98 F (36.7 C)] 98 F (36.7 C) (01/24 1403) Pulse Rate:  [64] 64 (01/24 1403) Resp:  [14] 14 (01/24 1403) BP: (88)/(48) 88/48 (01/24 1403) SpO2:  [92 %] 92 % (01/24 1403) Weight:  [90.7 kg (200 lb)] 90.7 kg (200 lb) (01/24 1404) General exam: Awake alert in no apparent distress HEENT: Normocephalic atraumatic, no oropharyngeal obstruction, keeps her left eyelid closed. CVS: S1-S2 heard regular rate rhythm Respiratory: Chest clear to auscultation Abdomen: Obese, nontender Extremities: 1+ pitting pedal edema bilaterally Neurological exam next line patient is awake alert oriented x3 Her speech is mildly dysarthric Naming comprehension repetition is intact Cranial nerves: Legally blind in the left eye, right eye full visual fields and pupil sluggishly reactive, face symmetric, palate elevates in midline, facial sensation intact, shoulder shrug intact, tongue midline. Motor exam: No vertical drift in the upper extremities at one point, then had drift in the left upper extremity at one point which resolved, both lower extremities unable to keep above the bed for 5 seconds. Sensory exam: Decreased sensation to all modalities on the left with a sharp cut off in the midline, also spreads tuning fork on the forehead right in the middle. Coordination: Intact finger-nose-finger Gait testing was deferred for patient safety. DTRs: Could not elicit any DTRs  NIH stroke scale 1a Level of Conscious.: 0 1b LOC Questions: 0 1c LOC Commands: 0 2 Best Gaze: 0 3 Visual: 0 4 Facial Palsy: 0 5a Motor Arm - left: 1 5b Motor Arm - Right: 1 6a Motor Leg - Left: 2 6b Motor Leg - Right: 2 7 Limb Ataxia: 0 8 Sensory: 1 9 Best Language: 0 10 Dysarthria: 1 11 Extinct. and  Inatten.: 0 TOTAL: 8 (unclear of delta NIH)  Labs I have reviewed labs in epic and the results pertinent to this consultation are: CBC    Component Value Date/Time   WBC 8.7 01/23/2017 2236   RBC 4.46 01/23/2017 2236   HGB 13.6 03/12/2017 1324   HCT 40.0 03/12/2017 1324   PLT 230 01/23/2017 2236   MCV 91.0 01/23/2017 2236   MCH 28.7 01/23/2017 2236   MCHC 31.5 01/23/2017 2236   RDW 14.8 01/23/2017 2236   LYMPHSABS 3.1 01/23/2017 2236   MONOABS 0.5 01/23/2017 2236   EOSABS 0.2 01/23/2017 2236   BASOSABS 0.0 01/23/2017 2236   CMP     Component Value Date/Time   NA 140 03/12/2017 1324   K 4.4 03/12/2017 1324   CL 103 03/12/2017 1324  CO2 26 03/04/2017 1112   GLUCOSE 100 (H) 03/12/2017 1324   BUN 13 03/12/2017 1324   CREATININE 0.90 03/12/2017 1324   CALCIUM 9.5 03/04/2017 1112   PROT 7.3 03/04/2017 1112   ALBUMIN 4.4 03/04/2017 1112   AST 19 03/04/2017 1112   ALT 22 03/04/2017 1112   ALKPHOS 71 03/04/2017 1112   BILITOT 0.4 03/04/2017 1112   GFRNONAA >60 01/23/2017 2236   GFRAA >60 01/23/2017 2236    Imaging I have reviewed the images obtained:  CT-scan of the brain -  No acute changes. ASPECTS 10. CTA H+N -no emergent large vessel occlusion.  Bilateral ICA paraophthalmic aneurysm stable since the MRI of 01/24/2017. Moderate arch and great vessel origin atherosclerosis.  Nonspecific patchy opacity in both upper lungs similar in appearance from 01/24/2017.  Assessment:  67 year old with past medical history as above presenting for slurred speech and difficulty walking. Inconsistent exam with weakness initially on the right side than on the left side. I do not think her symptoms are consistent with an acute ischemic stroke. Her blood pressure on arrival was in the 80s. My differentials include toxic metabolic encephalopathy in the setting of multiple medications that she is on versus less likely stroke versus conversion. She will be requiring admission for her  hypotension, which might be the sole etiology of her current presentation. I discussed with the interventional radiology attending the possibility of her getting her aneurysm treated while she is inpatient, and they have agreed to put her on the schedule for tomorrow if she is medically optimized.  Impression: Hypotension Toxic metabolic encephalopathy Evaluate for stroke Right ICA aneurysm-unruptured Left ICA aneurysm-unruptured  Recommendations: I do not think she needs an admission from a stroke or neurological workup perspective. But her hypotension might require her to be admitted.  I will leave that decision up to the emergency room providers. -Keep her n.p.o. for possible IR guided coiling of the right IC aneurysm tomorrow. -If she is medically not optimized, the procedure might have to wait till Monday. -From a stroke perspective- keep her on the antiplatelets and statin. -For the altered mental status/toxic metabolic encephalopathy-minimize sedating medications. -I do not see the need for obtaining an MRI at this time. --PT OT ST  Please call neurology with questions, we will be available as needed. -- Amie Portland, MD [facility] Pager: [phone] If 7pm to 7am, please call on call as listed on AMION.  CRITICAL CARE ATTESTATION This patient is critically ill and at significant risk of neurological worsening, death and care requires constant monitoring of vital signs, hemodynamics,respiratory and cardiac monitoring. I spent 55  minutes of neurocritical care time performing neurological assessment, discussion with family, other specialists and medical decision making of high complexityin the care of  this patient.

## 2017-03-12 NOTE — Progress Notes (Signed)
Chief Complaint: Patient was seen in consultation today for known intracranial aneurysm at the request of Dr. Amie Portland  Referring Physician(s): Dr. Amie Portland  Supervising Physician: Luanne Bras  Patient Status: [facility] - In-pt  History of Present Illness: Kelsey Hanna is a 67 y.o. female who was seen by Dr. Estanislado Pandy in consult for symptomatic intracranial aneurysms. See full consult note from 1/7 for details. She was to be scheduled for intervention but could not work out post procedure care as her family was apparently not able to assist her. She has been admitted today after recurrent symptoms of slurred speech and weakness. No gross infarct identified on imaging. Stable aneurysms again noted with the larger at the right paraopthalmic level. NIR is asked to eval. PMHx, meds, labs, imaging reviewed. She has been taking her Plavix and ASA daily including this morning.  Past Medical History:  Diagnosis Date  . Anxiety   . Bipolar 1 disorder ([place])   . Cancer [facility])    cancer of uterus...no chemo or radiation  . Chicken pox   . Depression   . Diabetes mellitus    dx within last yr....just takes pills  . Dyslipidemia   . Hypothyroid   . Retinal detachment   . Sleep apnea     Past Surgical History:  Procedure Laterality Date  . ABDOMINAL HYSTERECTOMY    . IR RADIOLOGIST EVAL & MGMT  02/19/2017  . LUMBAR LAMINECTOMY/DECOMPRESSION MICRODISCECTOMY  07/17/2011   Procedure: LUMBAR LAMINECTOMY/DECOMPRESSION MICRODISCECTOMY;  Surgeon: Sinclair Ship, MD;  Location: [place];  Service: Orthopedics;  Laterality: Left;  Left sided lumbar 4-5 microdisectomy  . TONSILLECTOMY    . TOTAL ABDOMINAL HYSTERECTOMY W/ BILATERAL SALPINGOOPHORECTOMY      Allergies: Patient has no known allergies.  Medications: Prior to Admission medications   Medication Sig Start Date End Date Taking? Authorizing Provider  aspirin 325 MG tablet Take 1 tablet (325 mg total) by mouth  daily. 01/24/17   Aline August, MD  atorvastatin (LIPITOR) 40 MG tablet Take 1 tablet (40 mg total) by mouth every evening. 03/04/17   [name], FNP  clonazePAM (KLONOPIN) 0.5 MG tablet Take 0.5 mg by mouth 2 (two) times daily. 1 in the morning and 1 midday    [provider]  clopidogrel (PLAVIX) 75 MG tablet Take 75 mg by mouth daily.    [provider]  diphenhydrAMINE (BENADRYL) 25 MG tablet Take 25 mg by mouth daily.    [provider]  DOCQLACE 100 MG capsule TAKE 1 TO 2 CAPSULES BY MOUTH TWICE A DAY AS NEEDED FOR MILD CONSTIPATION 11/11/16   Golden Circle, FNP  gabapentin (NEURONTIN) 100 MG capsule Take 100 mg by mouth 3 (three) times daily.    [provider]  levothyroxine (SYNTHROID, LEVOTHROID) 150 MCG tablet TAKE 1 TABLET BY MOUTH DAILY BEFORE BREAKFAST 03/06/17   [name], FNP  linaclotide [facility]) 145 MCG CAPS capsule Take 1 capsule (145 mcg total) by mouth daily before breakfast. 03/04/17   [name], FNP  OLANZapine (ZYPREXA) 20 MG tablet Take 20 mg by mouth at bedtime.  02/11/17   [provider]  Oxycodone HCl 10 MG TABS Take 10 mg by mouth every 6 (six) hours as needed (pain).    [provider]  tiZANidine (ZANAFLEX) 4 MG tablet Take 4 mg by mouth every 8 (eight) hours as needed for muscle spasms.    [provider]  zolpidem (AMBIEN) 5 MG tablet Take  1 tablet (5 mg total) by mouth at bedtime as needed for sleep. 03/06/17   [name], FNP     Family History  Problem Relation Age of Onset  . Healthy Mother   . Healthy Father     Social History   Socioeconomic History  . Marital status: Divorced    Spouse name: Not on file  . Number of children: 2  . Years of education: 25  . Highest education level: Not on file  Social Needs  . Financial resource strain: Not on file  . Food insecurity - worry: Not on file  . Food insecurity - inability: Not on file    . Transportation needs - medical: Not on file  . Transportation needs - non-medical: Not on file  Occupational History  . Occupation: Disability  Tobacco Use  . Smoking status: Current Every Day Smoker    Packs/day: 0.25    Years: 35.00    Pack years: 8.75    Types: Cigarettes    Last attempt to quit: 07/09/1999    Years since quitting: 17.6  . Smokeless tobacco: Never Used  Substance and Sexual Activity  . Alcohol use: No    Alcohol/week: 0.0 oz    Comment: socially  . Drug use: No  . Sexual activity: Not on file  Other Topics Concern  . Not on file  Social History Narrative   Denies abuse and feels safe at home.     Review of Systems: A 12 point ROS discussed and pertinent positives are indicated in the HPI above.  All other systems are negative.  Review of Systems  Vital Signs: BP (!) 102/57   Pulse 61   Temp 98 F (36.7 C) (Oral)   Resp 16   Ht 5\' 3"  (1.6 m)   Wt 200 lb (90.7 kg)   SpO2 98%   BMI 35.43 kg/m   Physical Exam  Constitutional: She is oriented to person, place, and time. She appears well-developed. No distress.  HENT:  Mouth/Throat: Oropharynx is clear and moist.  Neck: Normal range of motion. No JVD present. No tracheal deviation present.  Cardiovascular: Normal rate, regular rhythm and normal heart sounds.  Pulmonary/Chest: Effort normal and breath sounds normal. No respiratory distress.  Neurological: She is alert and oriented to person, place, and time.  Psychiatric: She has a normal mood and affect.   Awake and alert  Imaging: Ct Angio Head W Or Wo Contrast  Result Date: 03/12/2017 CLINICAL DATA:  67 year old female with abnormal speech and bilateral weakness. Bilateral ICA paraophthalmic aneurysms. EXAM: CT ANGIOGRAPHY HEAD AND NECK CT PERFUSION BRAIN TECHNIQUE: Multidetector CT imaging of the head and neck was performed using the standard protocol during bolus administration of intravenous contrast. Multiplanar CT image reconstructions  and MIPs were obtained to evaluate the vascular anatomy. Carotid stenosis measurements (when applicable) are obtained utilizing NASCET criteria, using the distal internal carotid diameter as the denominator. Multiphase CT imaging of the brain was performed following IV bolus contrast injection. Subsequent parametric perfusion maps were calculated using RAPID software. CONTRAST:  100 milliliters Isovue 370 COMPARISON:  Head CT without contrast 1338 hr today. FINDINGS: CT Brain Perfusion Findings: CBF (<30%) Volume: 0 milliliters Perfusion (Tmax>6.0s) volume: 0 milliliters Mismatch Volume: Not applicablemL Infarction Location:Not applicable CTA NECK Skeleton: Absent dentition. Cervical spine degeneration. Stable visualized osseous structures. Upper chest: Patchy and dependent pulmonary opacity in the upper lungs is similar to that in December and may reflect a combination of chronic lung disease  and atelectasis. No superior mediastinal lymphadenopathy. Other neck: Negative aside from a partially retropharyngeal course of both carotid arteries. No cervical lymphadenopathy. Aortic arch: 3 vessel arch configuration. Stable mild to moderate arch and great vessel origin calcified plaque. Right carotid system: Mildly increased soft plaque in the ventral brachiocephalic artery since December, or might have been obscured by artifact previously. No significant brachiocephalic artery stenosis. No right CCA origin stenosis. Tortuous proximal right CCA. Mild plaque in the right CCA and at the right carotid bifurcation without stenosis. Partially retropharyngeal but otherwise negative cervical right ICA. Left carotid system: Stable calcified plaque at the left CCA origin without stenosis. Intermittent mild plaque in the left CCA. Minimal calcified plaque at the left ICA origin and bulb without stenosis. Mildly tortuous cervical left ICA with a partially retropharyngeal course. Vertebral arteries: Stable tortuosity of the proximal  right subclavian artery with a kinked appearance before the right vertebral artery origin. Stable calcified plaque at the right vertebral artery origin with mild stenosis. The right vertebral artery is patent to the skull base without additional stenosis. No proximal left subclavian artery stenosis despite some soft plaque. Normal left vertebral artery origin. Tortuous left V1 segment with a mildly kinked appearance. Patent left vertebral artery to the skull base without additional stenosis. CTA HEAD Posterior circulation: Codominant distal vertebral arteries. Patent left PICA origin. The right AICA appears dominant. Patent vertebrobasilar junction. Patent basilar artery without stenosis. Normal SCA and left PCA origins. Fetal type right PCA origin. Bilateral PCA branches are within normal limits. Anterior circulation: Both ICA siphons are patent. Mild siphon plaque. No siphon stenosis. 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 This appears to arise from the undersurface of the left supraclinoid segment (paraophthalmic). 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 This aneurysm might incorporate the origin of the right ophthalmic artery, uncertain. Carotid termini are stable and patent. Dominant left ACA A1 segment, the right A1 is diminutive. MCA and ACA origins are normal. Anterior communicating artery and bilateral ACA branches are within normal limits. Left MCA M1 segment, left MCA bifurcation, and left MCA branches are stable and within normal limits. Right MCA M1 segment, bifurcation, and right MCA branches are stable and within normal limits. Venous sinuses: Patent. Anatomic variants: Dominant left and diminutive right ACA A1 segments. Fetal type right PCA origin. Review of the MIP images  confirms the above findings IMPRESSION: 1. Negative for emergent large vessel occlusion. No core infarct or penumbra detected by CTP. Results of #1 were communicated to Dr. Rory Percy at 1411 hr on 1/24/2019by text page via the [facility] messaging system. 2. 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 3. Up to moderate arch and great vessel origin atherosclerosis, but otherwise mild for age atherosclerosis in the head and neck. No significant arterial stenosis identified. 4. Nonspecific patchy opacity in both upper lungs appears similar to that on 01/24/17, perhaps a combination of atelectasis and chronic lung disease. Electronically Signed   By: Genevie Ann M.D.   On: 03/12/2017 14:24   Ct Angio Neck W Or Wo Contrast  Result Date: 03/12/2017 CLINICAL DATA:  67 year old female with abnormal speech and bilateral weakness. Bilateral  ICA paraophthalmic aneurysms. EXAM: CT ANGIOGRAPHY HEAD AND NECK CT PERFUSION BRAIN TECHNIQUE: Multidetector CT imaging of the head and neck was performed using the standard protocol during bolus administration of intravenous contrast. Multiplanar CT image reconstructions and MIPs were obtained to evaluate the vascular anatomy. Carotid stenosis measurements (when applicable) are obtained utilizing NASCET criteria, using the distal internal carotid diameter as the denominator. Multiphase CT imaging of the brain was performed following IV bolus contrast injection. Subsequent parametric perfusion maps were calculated using RAPID software. CONTRAST:  100 milliliters Isovue 370 COMPARISON:  Head CT without contrast 1338 hr today. FINDINGS: CT Brain Perfusion Findings: CBF (<30%) Volume: 0 milliliters Perfusion (Tmax>6.0s) volume: 0 milliliters Mismatch Volume: Not applicablemL Infarction Location:Not applicable CTA NECK Skeleton: Absent dentition. Cervical spine  degeneration. Stable visualized osseous structures. Upper chest: Patchy and dependent pulmonary opacity in the upper lungs is similar to that in December and may reflect a combination of chronic lung disease and atelectasis. No superior mediastinal lymphadenopathy. Other neck: Negative aside from a partially retropharyngeal course of both carotid arteries. No cervical lymphadenopathy. Aortic arch: 3 vessel arch configuration. Stable mild to moderate arch and great vessel origin calcified plaque. Right carotid system: Mildly increased soft plaque in the ventral brachiocephalic artery since December, or might have been obscured by artifact previously. No significant brachiocephalic artery stenosis. No right CCA origin stenosis. Tortuous proximal right CCA. Mild plaque in the right CCA and at the right carotid bifurcation without stenosis. Partially retropharyngeal but otherwise negative cervical right ICA. Left carotid system: Stable calcified plaque at the left CCA origin without stenosis. Intermittent mild plaque in the left CCA. Minimal calcified plaque at the left ICA origin and bulb without stenosis. Mildly tortuous cervical left ICA with a partially retropharyngeal course. Vertebral arteries: Stable tortuosity of the proximal right subclavian artery with a kinked appearance before the right vertebral artery origin. Stable calcified plaque at the right vertebral artery origin with mild stenosis. The right vertebral artery is patent to the skull base without additional stenosis. No proximal left subclavian artery stenosis despite some soft plaque. Normal left vertebral artery origin. Tortuous left V1 segment with a mildly kinked appearance. Patent left vertebral artery to the skull base without additional stenosis. CTA HEAD Posterior circulation: Codominant distal vertebral arteries. Patent left PICA origin. The right AICA appears dominant. Patent vertebrobasilar junction. Patent basilar artery without stenosis.  Normal SCA and left PCA origins. Fetal type right PCA origin. Bilateral PCA branches are within normal limits. Anterior circulation: Both ICA siphons are patent. Mild siphon plaque. No siphon stenosis. 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 This appears to arise from the undersurface of the left supraclinoid segment (paraophthalmic). 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 This aneurysm might incorporate the origin of the right ophthalmic artery, uncertain. Carotid termini are stable and patent. Dominant left ACA A1 segment, the right A1 is diminutive. MCA and ACA origins are normal. Anterior communicating artery and bilateral ACA branches are within normal limits. Left MCA M1 segment, left MCA bifurcation, and left MCA branches are stable and within normal limits. Right MCA M1 segment, bifurcation, and right MCA branches are stable and within normal limits. Venous sinuses: Patent. Anatomic variants: Dominant left and diminutive right ACA A1 segments. Fetal type right  PCA origin. Review of the MIP images confirms the above findings IMPRESSION: 1. Negative for emergent large vessel occlusion. No core infarct or penumbra detected by CTP. Results of #1 were communicated to Dr. Rory Percy at 1411 hr on 1/24/2019by text page via the [facility] messaging system. 2. 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 3. Up to moderate arch and great vessel origin atherosclerosis, but otherwise mild for age atherosclerosis in the head and neck. No significant arterial stenosis identified. 4. Nonspecific patchy opacity in both upper lungs appears similar to that on 01/24/17, perhaps  a combination of atelectasis and chronic lung disease. Electronically Signed   By: Genevie Ann M.D.   On: 03/12/2017 14:24   Ct Cerebral Perfusion W Contrast  Result Date: 03/12/2017 CLINICAL DATA:  67 year old female with abnormal speech and bilateral weakness. Bilateral ICA paraophthalmic aneurysms. EXAM: CT ANGIOGRAPHY HEAD AND NECK CT PERFUSION BRAIN TECHNIQUE: Multidetector CT imaging of the head and neck was performed using the standard protocol during bolus administration of intravenous contrast. Multiplanar CT image reconstructions and MIPs were obtained to evaluate the vascular anatomy. Carotid stenosis measurements (when applicable) are obtained utilizing NASCET criteria, using the distal internal carotid diameter as the denominator. Multiphase CT imaging of the brain was performed following IV bolus contrast injection. Subsequent parametric perfusion maps were calculated using RAPID software. CONTRAST:  100 milliliters Isovue 370 COMPARISON:  Head CT without contrast 1338 hr today. FINDINGS: CT Brain Perfusion Findings: CBF (<30%) Volume: 0 milliliters Perfusion (Tmax>6.0s) volume: 0 milliliters Mismatch Volume: Not applicablemL Infarction Location:Not applicable CTA NECK Skeleton: Absent dentition. Cervical spine degeneration. Stable visualized osseous structures. Upper chest: Patchy and dependent pulmonary opacity in the upper lungs is similar to that in December and may reflect a combination of chronic lung disease and atelectasis. No superior mediastinal lymphadenopathy. Other neck: Negative aside from a partially retropharyngeal course of both carotid arteries. No cervical lymphadenopathy. Aortic arch: 3 vessel arch configuration. Stable mild to moderate arch and great vessel origin calcified plaque. Right carotid system: Mildly increased soft plaque in the ventral brachiocephalic artery since December, or might have been obscured by artifact previously. No significant brachiocephalic artery  stenosis. No right CCA origin stenosis. Tortuous proximal right CCA. Mild plaque in the right CCA and at the right carotid bifurcation without stenosis. Partially retropharyngeal but otherwise negative cervical right ICA. Left carotid system: Stable calcified plaque at the left CCA origin without stenosis. Intermittent mild plaque in the left CCA. Minimal calcified plaque at the left ICA origin and bulb without stenosis. Mildly tortuous cervical left ICA with a partially retropharyngeal course. Vertebral arteries: Stable tortuosity of the proximal right subclavian artery with a kinked appearance before the right vertebral artery origin. Stable calcified plaque at the right vertebral artery origin with mild stenosis. The right vertebral artery is patent to the skull base without additional stenosis. No proximal left subclavian artery stenosis despite some soft plaque. Normal left vertebral artery origin. Tortuous left V1 segment with a mildly kinked appearance. Patent left vertebral artery to the skull base without additional stenosis. CTA HEAD Posterior circulation: Codominant distal vertebral arteries. Patent left PICA origin. The right AICA appears dominant. Patent vertebrobasilar junction. Patent basilar artery without stenosis. Normal SCA and left PCA origins. Fetal type right PCA origin. Bilateral PCA branches are within normal limits. Anterior circulation: Both ICA siphons are patent. Mild siphon plaque. No siphon stenosis. 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 This appears to arise from the undersurface of the left supraclinoid segment (paraophthalmic). 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 This aneurysm might incorporate the origin of the right ophthalmic artery, uncertain. Carotid  termini are stable and patent. Dominant left ACA A1 segment, the right A1 is diminutive. MCA and ACA origins are normal. Anterior communicating artery and bilateral ACA branches are within normal limits. Left MCA M1 segment, left MCA bifurcation, and left MCA branches are stable and within normal limits. Right MCA M1 segment, bifurcation, and right MCA branches are stable and within normal limits. Venous sinuses: Patent. Anatomic variants: Dominant left and diminutive right ACA A1 segments. Fetal type right PCA origin. Review of the MIP images confirms the above findings IMPRESSION: 1. Negative for emergent large vessel occlusion. No core infarct or penumbra detected by CTP. Results of #1 were communicated to Dr. Rory Percy at 1411 hr on 1/24/2019by text page via the [facility] messaging system. 2. 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 3. Up to moderate arch and great vessel origin atherosclerosis, but otherwise mild for age atherosclerosis in the head and neck. No significant arterial stenosis identified. 4. Nonspecific patchy opacity in both upper lungs appears similar to that on 01/24/17, perhaps a combination of atelectasis and chronic lung disease. Electronically Signed   By: Genevie Ann M.D.   On: 03/12/2017 14:24   Dg Chest Port 1 View  Result Date: 03/12/2017 CLINICAL DATA:  Hypotension EXAM: PORTABLE CHEST 1 VIEW COMPARISON:  06/08/2015 FINDINGS: Cardiac shadow is at the upper limits of normal in size but stable. The lungs are well aerated bilaterally. Elevation of left hemidiaphragm is again noted. No focal infiltrate or sizable effusion is seen. No bony abnormality is noted. IMPRESSION: No acute abnormality noted. Electronically Signed   By: Inez Catalina M.D.   On: 03/12/2017 14:36   Ct Head Code Stroke Wo Contrast  Result Date: 03/12/2017 CLINICAL DATA:  Code  stroke. 67 year old female with abnormal speech and bilateral weakness. Left ICA paraophthalmic aneurysm. EXAM: CT HEAD WITHOUT CONTRAST TECHNIQUE: Contiguous axial images were obtained from the base of the skull through the vertex without intravenous contrast. COMPARISON:  CTA neck 01/24/2017. Brain MRI and MRA 01/24/2017. Head CT 01/23/2017. Sinuses FINDINGS: Brain: Stable cerebral volume. Cavum septum pellucidum, normal variant. No ventriculomegaly. No midline shift, mass effect, or evidence of intracranial mass lesion. Patchy bilateral white matter hypodensity is stable. No acute intracranial hemorrhage identified. No cortically based acute infarct identified. Vascular: Calcified atherosclerosis at the skull base. No suspicious intracranial vascular hyperdensity. Skull: No acute osseous abnormality identified. Hyperostosis frontalis, normal variant. Sinuses/Orbits: Stable and negative. Other: Stable and negative. ASPECTS ([place] Stroke Program Early CT Score) - Ganglionic level infarction (caudate, lentiform nuclei, internal capsule, insula, M1-M3 cortex): 7 - Supraganglionic infarction (M4-M6 cortex): 3 Total score (0-10 with 10 being normal): 10 IMPRESSION: 1. Stable non contrast CT appearance of the brain since December 2018. 2. ASPECTS is 10. 3. These results were communicated to Dr. Rory Percy at [place] 1/24/2019by text page via the [facility] messaging system. Electronically Signed   By: Genevie Ann M.D.   On:  03/12/2017 13:46   Ir Radiologist Eval & Mgmt  Result Date: 02/23/2017 EXAM: NEW PATIENT OFFICE VISIT CHIEF COMPLAINT: Intermittent headaches. Recent history of TIA. Workup revealed two intracranial aneurysms. Current Pain Level: 1-10 HISTORY OF PRESENT ILLNESS: The patient is a 67 year old right handed lady who was admitted recently with a code stroke on 01/23/2017. Her symptoms at the time of her presentation with a code stroke were symptoms of slurred speech, difficulty speaking on the phone and also  difficulty with ambulation. It was unclear whether this was related to unilateral bilateral weakness. She eventually reported that her left side was weak. She underwent a workup which included an MRI MRA examination of brain, and also CT angiogram of the neck. No large vessel occlusions were noted. No diffusion-weighted abnormalities were seen on the diffusion-weighted sequences. The MRA, however, did reveal approximately 9 mm x 5.2 mm bilobed right internal carotid artery paraophthalmic region aneurysm, and also an approximately 5.2 mm left internal carotid artery intracranial superior hypophyseal region aneurysm. No large vessel occlusion were seen. The patient also underwent a transthoracic echocardiogram which revealed a normal left ventricle with an estimated ejection fraction of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

## 2017-03-13 ENCOUNTER — Encounter (HOSPITAL_COMMUNITY): Payer: Self-pay | Admitting: Anesthesiology

## 2017-03-13 ENCOUNTER — Observation Stay (HOSPITAL_COMMUNITY): Payer: Medicare Other

## 2017-03-13 ENCOUNTER — Encounter (HOSPITAL_COMMUNITY)
Admission: EM | Disposition: A | Discharge status: Skilled Nursing Facility | Payer: Self-pay | Source: Home / Self Care | Attending: Interventional Radiology

## 2017-03-13 ENCOUNTER — Observation Stay (HOSPITAL_COMMUNITY): Payer: Medicare Other | Admitting: Anesthesiology

## 2017-03-13 DIAGNOSIS — I5032 Chronic diastolic (congestive) heart failure: Secondary | ICD-10-CM | POA: Diagnosis not present

## 2017-03-13 DIAGNOSIS — R930 Abnormal findings on diagnostic imaging of skull and head, not elsewhere classified: Secondary | ICD-10-CM | POA: Diagnosis not present

## 2017-03-13 DIAGNOSIS — G4733 Obstructive sleep apnea (adult) (pediatric): Secondary | ICD-10-CM | POA: Diagnosis not present

## 2017-03-13 DIAGNOSIS — T50905A Adverse effect of unspecified drugs, medicaments and biological substances, initial encounter: Secondary | ICD-10-CM | POA: Diagnosis present

## 2017-03-13 DIAGNOSIS — I959 Hypotension, unspecified: Secondary | ICD-10-CM | POA: Diagnosis not present

## 2017-03-13 DIAGNOSIS — G464 Cerebellar stroke syndrome: Secondary | ICD-10-CM | POA: Diagnosis not present

## 2017-03-13 DIAGNOSIS — I5030 Unspecified diastolic (congestive) heart failure: Secondary | ICD-10-CM | POA: Diagnosis not present

## 2017-03-13 DIAGNOSIS — I10 Essential (primary) hypertension: Secondary | ICD-10-CM | POA: Diagnosis not present

## 2017-03-13 DIAGNOSIS — R278 Other lack of coordination: Secondary | ICD-10-CM | POA: Diagnosis not present

## 2017-03-13 DIAGNOSIS — R7881 Bacteremia: Secondary | ICD-10-CM

## 2017-03-13 DIAGNOSIS — I340 Nonrheumatic mitral (valve) insufficiency: Secondary | ICD-10-CM | POA: Diagnosis present

## 2017-03-13 DIAGNOSIS — F319 Bipolar disorder, unspecified: Secondary | ICD-10-CM | POA: Diagnosis present

## 2017-03-13 DIAGNOSIS — B9562 Methicillin resistant Staphylococcus aureus infection as the cause of diseases classified elsewhere: Secondary | ICD-10-CM

## 2017-03-13 DIAGNOSIS — I952 Hypotension due to drugs: Secondary | ICD-10-CM | POA: Diagnosis not present

## 2017-03-13 DIAGNOSIS — R402142 Coma scale, eyes open, spontaneous, at arrival to emergency department: Secondary | ICD-10-CM | POA: Diagnosis present

## 2017-03-13 DIAGNOSIS — E119 Type 2 diabetes mellitus without complications: Secondary | ICD-10-CM | POA: Diagnosis not present

## 2017-03-13 DIAGNOSIS — E038 Other specified hypothyroidism: Secondary | ICD-10-CM | POA: Diagnosis not present

## 2017-03-13 DIAGNOSIS — H548 Legal blindness, as defined in USA: Secondary | ICD-10-CM | POA: Diagnosis present

## 2017-03-13 DIAGNOSIS — R402364 Coma scale, best motor response, obeys commands, 24 hours or more after hospital admission: Secondary | ICD-10-CM | POA: Diagnosis not present

## 2017-03-13 DIAGNOSIS — R402134 Coma scale, eyes open, to sound, 24 hours or more after hospital admission: Secondary | ICD-10-CM | POA: Diagnosis not present

## 2017-03-13 DIAGNOSIS — R262 Difficulty in walking, not elsewhere classified: Secondary | ICD-10-CM

## 2017-03-13 DIAGNOSIS — E669 Obesity, unspecified: Secondary | ICD-10-CM | POA: Diagnosis not present

## 2017-03-13 DIAGNOSIS — T148XXA Other injury of unspecified body region, initial encounter: Secondary | ICD-10-CM | POA: Diagnosis not present

## 2017-03-13 DIAGNOSIS — I671 Cerebral aneurysm, nonruptured: Secondary | ICD-10-CM | POA: Diagnosis not present

## 2017-03-13 DIAGNOSIS — I480 Paroxysmal atrial fibrillation: Secondary | ICD-10-CM | POA: Diagnosis not present

## 2017-03-13 DIAGNOSIS — L7632 Postprocedural hematoma of skin and subcutaneous tissue following other procedure: Secondary | ICD-10-CM | POA: Diagnosis not present

## 2017-03-13 DIAGNOSIS — F3110 Bipolar disorder, current episode manic without psychotic features, unspecified: Secondary | ICD-10-CM

## 2017-03-13 DIAGNOSIS — I361 Nonrheumatic tricuspid (valve) insufficiency: Secondary | ICD-10-CM | POA: Diagnosis not present

## 2017-03-13 DIAGNOSIS — Y838 Other surgical procedures as the cause of abnormal reaction of the patient, or of later complication, without mention of misadventure at the time of the procedure: Secondary | ICD-10-CM | POA: Diagnosis not present

## 2017-03-13 DIAGNOSIS — M6281 Muscle weakness (generalized): Secondary | ICD-10-CM | POA: Diagnosis not present

## 2017-03-13 DIAGNOSIS — E785 Hyperlipidemia, unspecified: Secondary | ICD-10-CM | POA: Diagnosis present

## 2017-03-13 DIAGNOSIS — R402252 Coma scale, best verbal response, oriented, at arrival to emergency department: Secondary | ICD-10-CM | POA: Diagnosis present

## 2017-03-13 DIAGNOSIS — M549 Dorsalgia, unspecified: Secondary | ICD-10-CM | POA: Diagnosis present

## 2017-03-13 DIAGNOSIS — E7849 Other hyperlipidemia: Secondary | ICD-10-CM | POA: Diagnosis not present

## 2017-03-13 DIAGNOSIS — I110 Hypertensive heart disease with heart failure: Secondary | ICD-10-CM

## 2017-03-13 DIAGNOSIS — F1910 Other psychoactive substance abuse, uncomplicated: Secondary | ICD-10-CM | POA: Diagnosis not present

## 2017-03-13 DIAGNOSIS — E039 Hypothyroidism, unspecified: Secondary | ICD-10-CM | POA: Diagnosis not present

## 2017-03-13 DIAGNOSIS — G92 Toxic encephalopathy: Secondary | ICD-10-CM | POA: Diagnosis not present

## 2017-03-13 DIAGNOSIS — R2689 Other abnormalities of gait and mobility: Secondary | ICD-10-CM | POA: Diagnosis not present

## 2017-03-13 DIAGNOSIS — F17210 Nicotine dependence, cigarettes, uncomplicated: Secondary | ICD-10-CM

## 2017-03-13 DIAGNOSIS — R402362 Coma scale, best motor response, obeys commands, at arrival to emergency department: Secondary | ICD-10-CM | POA: Diagnosis present

## 2017-03-13 DIAGNOSIS — R4781 Slurred speech: Secondary | ICD-10-CM | POA: Diagnosis not present

## 2017-03-13 DIAGNOSIS — M5442 Lumbago with sciatica, left side: Secondary | ICD-10-CM | POA: Diagnosis not present

## 2017-03-13 DIAGNOSIS — G8929 Other chronic pain: Secondary | ICD-10-CM | POA: Diagnosis present

## 2017-03-13 HISTORY — PX: IR ANGIOGRAM FOLLOW UP STUDY: IMG697

## 2017-03-13 HISTORY — PX: IR CT HEAD LTD: IMG2386

## 2017-03-13 HISTORY — PX: IR TRANSCATH/EMBOLIZ: IMG695

## 2017-03-13 HISTORY — PX: IR ANGIO VERTEBRAL SEL SUBCLAVIAN INNOMINATE UNI R MOD SED: IMG5365

## 2017-03-13 HISTORY — PX: RADIOLOGY WITH ANESTHESIA: SHX6223

## 2017-03-13 HISTORY — PX: IR ANGIO INTRA EXTRACRAN SEL COM CAROTID INNOMINATE UNI L MOD SED: IMG5358

## 2017-03-13 HISTORY — PX: IR 3D INDEPENDENT WKST: IMG2385

## 2017-03-13 HISTORY — PX: IR ANGIO INTRA EXTRACRAN SEL INTERNAL CAROTID UNI R MOD SED: IMG5362

## 2017-03-13 LAB — BLOOD CULTURE ID PANEL (REFLEXED)
ACINETOBACTER BAUMANNII: NOT DETECTED
CANDIDA KRUSEI: NOT DETECTED
Candida albicans: NOT DETECTED
Candida glabrata: NOT DETECTED
Candida parapsilosis: NOT DETECTED
Candida tropicalis: NOT DETECTED
ENTEROCOCCUS SPECIES: NOT DETECTED
ESCHERICHIA COLI: NOT DETECTED
Enterobacter cloacae complex: NOT DETECTED
Enterobacteriaceae species: NOT DETECTED
Haemophilus influenzae: NOT DETECTED
Klebsiella oxytoca: NOT DETECTED
Klebsiella pneumoniae: NOT DETECTED
LISTERIA MONOCYTOGENES: NOT DETECTED
METHICILLIN RESISTANCE: DETECTED — AB
NEISSERIA MENINGITIDIS: NOT DETECTED
PSEUDOMONAS AERUGINOSA: NOT DETECTED
Proteus species: NOT DETECTED
SERRATIA MARCESCENS: NOT DETECTED
STAPHYLOCOCCUS AUREUS BCID: DETECTED — AB
STREPTOCOCCUS AGALACTIAE: NOT DETECTED
STREPTOCOCCUS PNEUMONIAE: NOT DETECTED
STREPTOCOCCUS SPECIES: NOT DETECTED
Staphylococcus species: DETECTED — AB
Streptococcus pyogenes: NOT DETECTED

## 2017-03-13 LAB — BASIC METABOLIC PANEL
Anion gap: 8 (ref 5–15)
BUN: 12 mg/dL (ref 6–20)
CHLORIDE: 111 mmol/L (ref 101–111)
CO2: 22 mmol/L (ref 22–32)
Calcium: 8.6 mg/dL — ABNORMAL LOW (ref 8.9–10.3)
Creatinine, Ser: 0.75 mg/dL (ref 0.44–1.00)
GFR calc non Af Amer: >60 mL/min (ref >60)
Glucose, Bld: 99 mg/dL (ref 65–99)
Potassium: 4.4 mmol/L (ref 3.5–5.1)
SODIUM: 141 mmol/L (ref 135–145)

## 2017-03-13 LAB — CBC
HCT: 36.8 % (ref 36.0–46.0)
Hemoglobin: 11.8 g/dL — ABNORMAL LOW (ref 12.0–15.0)
MCH: 29.1 pg (ref 26.0–34.0)
MCHC: 32.1 g/dL (ref 30.0–36.0)
MCV: 90.9 fL (ref 78.0–100.0)
PLATELETS: 218 10*3/uL (ref 150–400)
RBC: 4.05 MIL/uL (ref 3.87–5.11)
RDW: 14.5 % (ref 11.5–15.5)
WBC: 7.9 10*3/uL (ref 4.0–10.5)

## 2017-03-13 LAB — GLUCOSE, CAPILLARY
GLUCOSE-CAPILLARY: 106 mg/dL — AB (ref 65–99)
GLUCOSE-CAPILLARY: 138 mg/dL — AB (ref 65–99)
GLUCOSE-CAPILLARY: 117 mg/dL — AB (ref 65–99)

## 2017-03-13 LAB — POCT ACTIVATED CLOTTING TIME
Activated Clotting Time: 169 seconds
Activated Clotting Time: 180 seconds
Activated Clotting Time: 208 seconds
Activated Clotting Time: 202 seconds

## 2017-03-13 LAB — MRSA PCR SCREENING: MRSA BY PCR: POSITIVE — AB

## 2017-03-13 LAB — HEPARIN LEVEL (UNFRACTIONATED): HEPARIN UNFRACTIONATED: 0.3 [IU]/mL (ref 0.30–0.70)

## 2017-03-13 IMAGING — XA IR  CT HEAD LIMITED
1 of 2 series · 7 of 24 positions shown · IV contrast (IODINE)
Comparison: none

INDICATION: Severe headaches. Workup revealed presence of multiple intracranial
aneurysms.

[Series 300: dr. (person_name) · 7 of 346 slices shown]
[im 1/346]
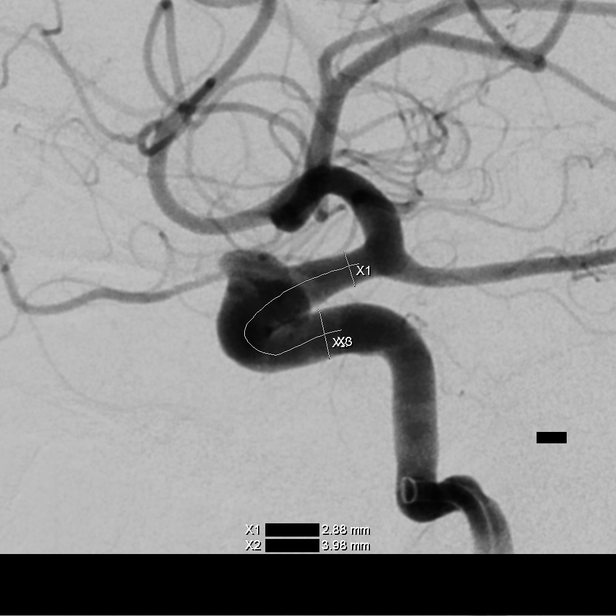
[im 63/346]
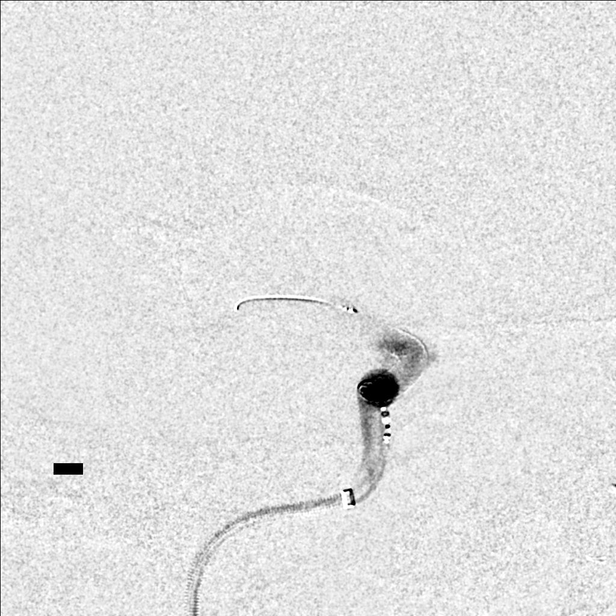
[im 110/346]
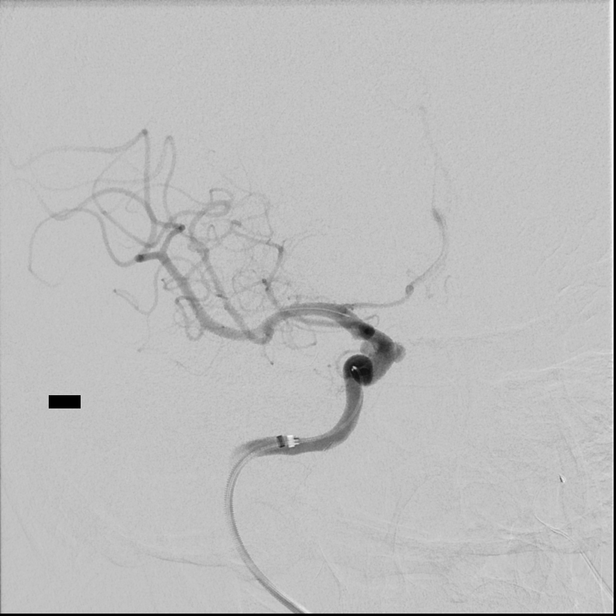
[im 173/346]
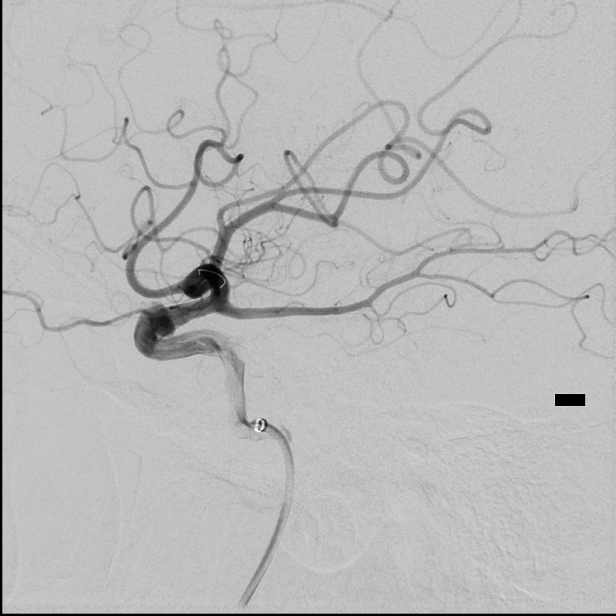
[im 220/346]
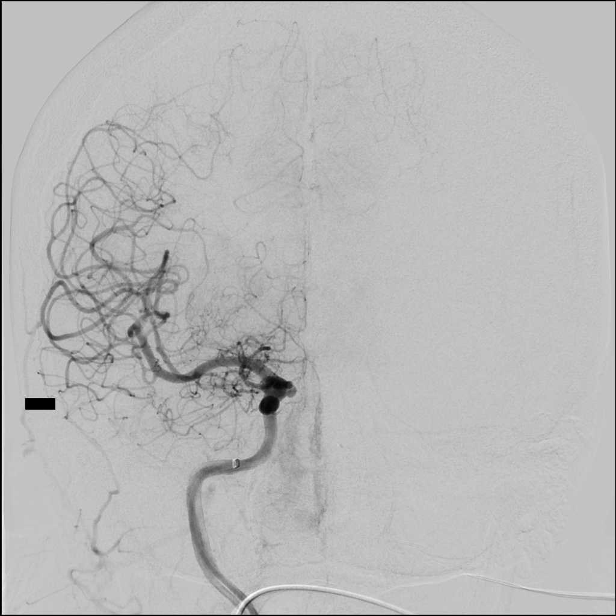
[im 267/346]
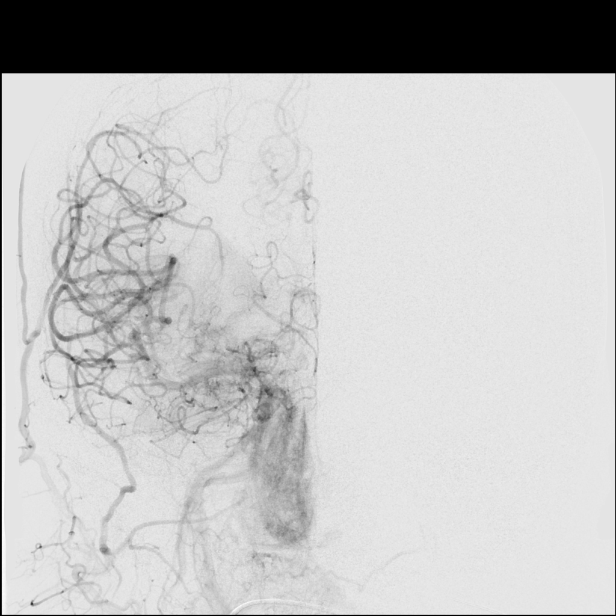
[im 330/346]
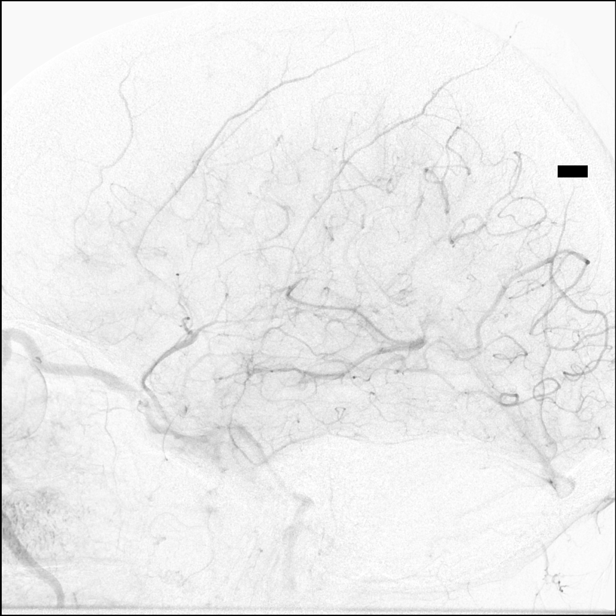

[7 of 24 positions shown; findings below may reference images not displayed]

EXAM:
BILATERAL COMMON CAROTID ARTERIOGRAMS, RIGHT VERTEBRAL ARTERY
ANGIOGRAM, FOLLOWED BY ENDOVASCULAR TREATMENT OF LOBULATED RIGHT
INTERNAL CAROTID ARTERY PARAOPHTHALMIC REGION ANEURYSM WITH PIPELINE
FLOW DIVERTER DEVICE.

MEDICATIONS:
Ancef 2 g IV antibiotic was administered within 1 hour of the
procedure.

ANESTHESIA/SEDATION:
Mac anesthesia followed by general anesthesia by the [REDACTED] at [HOSPITAL].

CONTRAST:  Isovue 300 approximately 120 mL.

FLUOROSCOPY TIME:  Fluoroscopy Time: 50 minutes 42 seconds (0542
mGy).

COMPLICATIONS:
None immediate.



The right groin was prepped and draped in the usual sterile fashion.
Thereafter using modified Seldinger technique, transfemoral access
into right common femoral artery was obtained without difficulty.
Over a 0.035 inch guidewire, a 5 French Pinnacle sheath was
inserted. Through this, and also over a 0.035 inch guidewire a 5
French JB 1 catheter was advanced to the aortic arch region and
selectively positioned in the innominate artery, right common
carotid artery and left common carotid artery.
FINDINGS: The left common carotid arteriogram demonstrates the left external
carotid artery and its major branches to be widely patent.

The left internal carotid artery at the bulb to the cranial skull
base opacifies normally.

The petrous, the cavernous and supraclinoid segments demonstrate
wide patency.

Arising in the superior hypophyseal region of the left internal
carotid artery at the level of the ophthalmic artery is a
peanut-shaped saccular aneurysm with a wide neck projecting medially
and laterally and inferiorly. This measures approximately 5.7 mm x 4
mm on the oblique AP projection, and 8 mm x 3.4 mm on the lateral
projection.

The left middle cerebral artery and the left anterior cerebral
artery opacify into the capillary and venous phases. Prompt
opacification via the anterior communicating artery of the right
anterior cerebral artery A2 segment and distally is noted.

The innominate artery injection demonstrates the origin of the right
common carotid artery and the right subclavian arteries to be widely
patent.

The right vertebral artery at its origin and its visualized cervical
portion is seen to opacify to the cranial skull base.

On lateral projection, wide patency is seen of the right
vertebrobasilar junction and the right posterior-inferior cerebellar
artery.

The opacified portion of the right vertebral artery on the lateral
projection demonstrates wide patency into the distal basilar artery.

The right common carotid arteriogram demonstrates the right external
carotid artery and its major branches to be widely patent.

The right internal carotid artery at the bulb to the cranial skull
base opacifies normally. The petrous, the cavernous and the
supraclinoid segments demonstrate wide patency.

A right posterior communicating artery is seen opacifying the right
posterior cerebral artery distribution.

The right middle cerebral artery is seen to opacify into the
capillary and venous phases.

Hypoplastic to aplastic right anterior cerebral A1 segment is also
noted.

A complex bilobed configuration emanating from the right internal
carotid artery in the paraophthalmic region is noted.

A 3D rotational arteriogram was then perfor[REDACTED]ed over this
area. 3D reconstruction images were then performed on a separate
workstation. These revealed the presence of an aneurysm at the level
of the ophthalmic artery with a second hemorrhage projecting
laterally with a wide-base.

A dysplastic appearing right internal carotid artery is seen at this
level.

The right middle cerebral artery and the right anterior cerebral
artery opacify into the capillary and venous phases.

Measurements were performed of the right internal carotid artery
distal to and also proximal to the landing zones of the planned
pipeline flex placement device proximal to the right posterior
communicating artery.

The diameter of the distal landing zone was measured at 3.5 mm and
the proximal landing zone as approximately 4.5 mm. The length to be
covered was measured at approximately 20 mm.

It was decided to use a 4 mm x 16 mm pipeline flex flow diverter
device.

The diagnostic JB 1 catheter was exchanged over a 0.035 inch 300 cm
Rosen exchange guidewire for a 6 French 80 cm Eliot Pang
using biplane roadmap technique and constant fluoroscopic guidance.
Good aspiration was obtained from the hub of the 8 Altagracia Beasley
Syntiche Celina using biplane roadmap technique and constant
fluoroscopic guidance. A gentle contrast injection demonstrated no
evidence of spasms, dissections or of intraluminal filling defects.

Through the [place], Carm 115 cm 5 French Catalyst guide
catheter was then advanced over 0.035 inch Roadrunner guidewire
using biplane roadmap technique and constant fluoroscopic guidance.
The 5 French Catalyst guide catheter was advanced to the distal
cervical left ICA.

The guidewire was removed. Good aspiration obtained from the hub of
the 5 French guide catheter. A gentle contrast injection
demonstrated no evidence of spasms, dissections or of intraluminal
filling defects.

A control arteriogram perfor[REDACTED]ed intracranially demonstrated
no changes intracranially.

At this time the 6 Seoung Jun Luella had been advanced to
the mid cervical segment of the right internal carotid artery.

At this time a 027 Phenom microcatheter was advanced over a
inch Softip Synchro micro guidewire to the distal end of the 5
French guide catheter.

With the micro guidewire leading with a J-tip configuration to avoid
dissections or inducing spasm, access was obtained past the 2
wide-based aneurysm into the supraclinoid right ICA followed by the
microcatheter. The micro guidewire was then advanced into the M2
region of the inferior division of the right middle cerebral artery
followed by the microcatheter. The guidewire was removed. Good
aspiration was obtained from the hub of the Phenom microcatheter. A
gentle contrast injection demonstrated safe positioning of the tip
of the microcatheter. This was then connected to continuous
heparinized saline infusion.

A 4 mm x 16 mm pipeline flex device was then advanced using biplane
roadmap technique and constant fluoroscopic guidance to the distal
end of the microcatheter. The O ring on the delivery system was then
loosened. With slight forward gentle traction with the right hand on
the delivery micro guidewire, with the left hand the delivery
microcatheter was gently retrieved unsheathing the distal wire and
then a few mm of the distal portion of the device.

Once the cigar-shaped configuration had opened up in the left middle
cerebral artery the combination was then retracted to the left
internal carotid artery supraclinoid segment and just proximal to
the origin of the left posterior communicating artery. Once there,
using the push and pull technique followed by fluffing and also
advancing the wire to deploy, the device was utilized under constant
fluoroscopic guidance to deploy the distal and then the proximal
portion of the device. Once deployed the distal portion of the
device was noted to be just proximal to the right posterior
communicating artery, with the proximal being in the mid horizontal
proximal cavernous segment.

The microcatheter was then gently advanced over the wire to the
right middle cerebral artery. The delivery micro guidewire was then
retrieved without difficulty. A control arteriogram performed
through the 5 French Catalyst guide catheter demonstrated excellent
apposition and coverage distally and also proximally.

A control arteriogram performed following removal of the
microcatheter into the right internal carotid artery, however,
demonstrated complete patency of the device which was now noted to
be just distal to the origins of the necks of the 2 aneurysms distal
to the ophthalmic artery.

It was then felt to provide more coverage at the neck of the
aneurysm and distally with improved diversion, a second pipeline
device measuring 4 mm x 12 mm was advanced to the distal end of the
Phenom microcatheter which was now again positioned in the M1 M2
region of the inferior division of the right middle cerebral artery.

Again the entire system was then straightened. With slight forward
gentle traction with the right hand on the delivery micro guidewire,
the microcatheter was gently retrieved unsheathing the distal wire
and then the part of the device distally.

Once opened, the combination was retrieved such that the distal
portion of the device was now at the level of the posterior
communicating artery. Thereafter, again using the advancement of the
micro guidewire to deploy the device, and the fluffing technique to
insure apposition of the device to the parent artery were used. The
tip of the microcatheter was maintained centrally within the parent
vessel lumen. Once completely deployed, a control arteriogram
performed through the 5 French guide catheter in the right internal
carotid artery demonstrated excellent coverage distally and
proximally. There was now stasis within the 2 aneurysms. The
microcatheter was then gently retrieved under constant fluoroscopic
guidance to ensure no movement of the devices.

None was observed.

Control arteriograms were then performed at 15 and 30 minutes post
deployment of the second device. These continued to demonstrate safe
apposition, and maintaining the distal coverage and the proximal
coverage of the aneurysms.

Throughout the procedure, the patient's blood pressure and
neurological status remained stable. The patient's ACT was
maintained in the region of approximately 200 seconds.

A final control arteriogram performed through the 6 Altagracia Beasley
Syntiche Celina in the right internal carotid artery continued to
demonstrate stasis within the aneurysms in the paraophthalmic
region. The distal portion of the second device was seen proximal to
the right posterior communicating artery with excellent coverage of
the neck of the aneurysms. No intraluminal filling defects or
occlusions were seen.

The 6 Seoung Jun Luella was then retrieved into the
abdominal aorta and exchanged over a J-tip guidewire for a 6 French
Pinnacle sheath. This in turn was then removed with the successful
application of an Angio-Seal closure device.

At the end of the procedure, the patient's right groin appeared soft
without evidence of a hematoma, or of bleeding.

The distal pulses remained palpable in the dorsalis pedis regions,
and Dopplerable in the posterior tibial regions bilaterally
unchanged from prior to the procedure.

The patient's general anesthesia was then reversed and the patient
was extubated without difficulty. Upon recovery the patient
demonstrated no new neurological signs or symptoms. This remain
unchanged compared to prior to the procedure. She was then
transported to the PACU and the neuro ICU to continue on low-dose IV
heparin and close monitoring of her blood pressure and neurological
status.

Throughout the night the patient's neurological status remained
stable. The patient reportedly had some ecchymosis at the groin
puncture site on account of her persistent moving despite multiple
attempts to convince her to keep her right leg straight. There was
no discrete palpable hematoma the following day. However, there was
in the inguinal region and the subinguinal region.

The patient's neurological status the following day remained stable
with no new motor or coordination or speech difficulties. IV heparin
was stopped and the patient was switched to aspirin 325 mg a day,
and Plavix 75 mg a day.

She was kept in the neuro ICU on account of her medical issues being
addressed to by ID. She continued to be stable neurologically and
eating normally. The patient underwent ultrasound of the right groin
which showed no evidence of a pseudoaneurysm or a large hematoma. A
relatively small hematoma was noted. The patient's distal pulses
remained palpable and as described earlier. She was then referred
back to internal medicine for management of her medical issues.

Patient was instructed to maintain adequate hydration by drinking
water and taking her antiplatelets. She will be seen in follow-up in
the clinic in about 2-3 weeks. Questions were answered to her
satisfaction. Patient will be back to internal medicine service.
IMPRESSION: Endovascular treatment of complex bilobed right internal carotid
artery paraophthalmic region aneurysm associated with a dysplastic
parent vessel using 2 pipeline flex flow diverter devices as
described above.

## 2017-03-13 SURGERY — IR WITH ANESTHESIA
Anesthesia: General

## 2017-03-13 MED ORDER — IOPAMIDOL (ISOVUE-300) INJECTION 61%
INTRAVENOUS | Status: AC
  Administered 2017-03-13: 60 mL
  Filled 2017-03-13: qty 150

## 2017-03-13 MED ORDER — ONDANSETRON HCL 4 MG/2ML IJ SOLN
INTRAMUSCULAR | Status: DC | PRN
  Administered 2017-03-13 (×2): 4 mg via INTRAVENOUS

## 2017-03-13 MED ORDER — CEFAZOLIN SODIUM-DEXTROSE 2-4 GM/100ML-% IV SOLN
INTRAVENOUS | Status: AC
  Filled 2017-03-13: qty 100

## 2017-03-13 MED ORDER — HEPARIN (PORCINE) IN NACL 100-0.45 UNIT/ML-% IJ SOLN
500.0000 [IU]/h | INTRAMUSCULAR | Status: DC
  Administered 2017-03-13: 500 [IU]/h via INTRAVENOUS
  Filled 2017-03-13: qty 250

## 2017-03-13 MED ORDER — ASPIRIN 325 MG PO TABS
325.0000 mg | ORAL_TABLET | Freq: Every day | ORAL | Status: DC
  Administered 2017-03-14 – 2017-03-18 (×5): 325 mg via ORAL
  Filled 2017-03-13 (×4): qty 1

## 2017-03-13 MED ORDER — CLOPIDOGREL BISULFATE 75 MG PO TABS
225.0000 mg | ORAL_TABLET | ORAL | Status: AC
  Administered 2017-03-13: 225 mg via ORAL
  Filled 2017-03-13: qty 3

## 2017-03-13 MED ORDER — VANCOMYCIN HCL IN DEXTROSE 1-5 GM/200ML-% IV SOLN
1000.0000 mg | Freq: Two times a day (BID) | INTRAVENOUS | Status: DC
Start: 2017-03-14 — End: 2017-03-13
  Filled 2017-03-13: qty 200

## 2017-03-13 MED ORDER — ZOLPIDEM TARTRATE 5 MG PO TABS
5.0000 mg | ORAL_TABLET | Freq: Once | ORAL | Status: AC
  Administered 2017-03-13: 5 mg via ORAL
  Filled 2017-03-13: qty 1

## 2017-03-13 MED ORDER — ARTIFICIAL TEARS OPHTHALMIC OINT
TOPICAL_OINTMENT | OPHTHALMIC | Status: DC | PRN
  Administered 2017-03-13: 1 via OPHTHALMIC

## 2017-03-13 MED ORDER — HEPARIN (PORCINE) IN NACL 100-0.45 UNIT/ML-% IJ SOLN
800.0000 [IU]/h | INTRAMUSCULAR | Status: DC
  Filled 2017-03-13: qty 250

## 2017-03-13 MED ORDER — HYDROMORPHONE HCL 1 MG/ML IJ SOLN
0.5000 mg | INTRAMUSCULAR | Status: DC | PRN
  Administered 2017-03-13 – 2017-03-14 (×5): 0.5 mg via INTRAVENOUS
  Filled 2017-03-13 (×5): qty 0.5

## 2017-03-13 MED ORDER — FENTANYL CITRATE (PF) 100 MCG/2ML IJ SOLN
INTRAMUSCULAR | Status: AC
  Filled 2017-03-13: qty 2

## 2017-03-13 MED ORDER — IOPAMIDOL (ISOVUE-300) INJECTION 61%
INTRAVENOUS | Status: AC
  Administered 2017-03-13: 21 mL
  Filled 2017-03-13: qty 100

## 2017-03-13 MED ORDER — HYDROMORPHONE HCL 1 MG/ML IJ SOLN: 1.0000 mg | INTRAMUSCULAR | Status: DC | PRN

## 2017-03-13 MED ORDER — PROMETHAZINE HCL 25 MG/ML IJ SOLN
6.2500 mg | INTRAMUSCULAR | Status: DC | PRN
Start: 2017-03-13 — End: 2017-03-13

## 2017-03-13 MED ORDER — ROCURONIUM BROMIDE 100 MG/10ML IV SOLN
INTRAVENOUS | Status: DC | PRN
  Administered 2017-03-13: 50 mg via INTRAVENOUS
  Administered 2017-03-13: 10 mg via INTRAVENOUS
  Administered 2017-03-13 (×3): 20 mg via INTRAVENOUS

## 2017-03-13 MED ORDER — ACETAMINOPHEN 650 MG RE SUPP: 650.0000 mg | RECTAL | Status: DC | PRN

## 2017-03-13 MED ORDER — ASPIRIN 325 MG PO TABS
325.0000 mg | ORAL_TABLET | Freq: Once | ORAL | Status: AC
  Administered 2017-03-13: 325 mg via ORAL
  Filled 2017-03-13: qty 1

## 2017-03-13 MED ORDER — VANCOMYCIN HCL 10 G IV SOLR
2000.0000 mg | Freq: Once | INTRAVENOUS | Status: DC
  Filled 2017-03-13: qty 2000

## 2017-03-13 MED ORDER — HEPARIN SODIUM (PORCINE) 1000 UNIT/ML IJ SOLN
INTRAMUSCULAR | Status: DC | PRN
  Administered 2017-03-13: 500 [IU] via INTRAVENOUS
  Administered 2017-03-13: 1000 [IU] via INTRAVENOUS
  Administered 2017-03-13: 3000 [IU] via INTRAVENOUS

## 2017-03-13 MED ORDER — SUGAMMADEX SODIUM 500 MG/5ML IV SOLN
INTRAVENOUS | Status: DC | PRN
  Administered 2017-03-13: 380 mg via INTRAVENOUS

## 2017-03-13 MED ORDER — LINEZOLID 600 MG PO TABS
600.0000 mg | ORAL_TABLET | Freq: Two times a day (BID) | ORAL | Status: DC
  Administered 2017-03-13 – 2017-03-14 (×2): 600 mg via ORAL
  Filled 2017-03-13 (×2): qty 1

## 2017-03-13 MED ORDER — GLYCOPYRROLATE 0.2 MG/ML IJ SOLN
INTRAMUSCULAR | Status: DC | PRN
  Administered 2017-03-13: 0.1 mg via INTRAVENOUS

## 2017-03-13 MED ORDER — PROPOFOL 10 MG/ML IV BOLUS
INTRAVENOUS | Status: DC | PRN
  Administered 2017-03-13: 150 mg via INTRAVENOUS

## 2017-03-13 MED ORDER — CLOPIDOGREL BISULFATE 75 MG PO TABS
75.0000 mg | ORAL_TABLET | Freq: Every day | ORAL | Status: DC
  Administered 2017-03-14 – 2017-03-18 (×5): 75 mg via ORAL
  Filled 2017-03-13 (×5): qty 1

## 2017-03-13 MED ORDER — NICARDIPINE HCL IN NACL 20-0.86 MG/200ML-% IV SOLN
0.0000 mg/h | INTRAVENOUS | Status: DC
  Filled 2017-03-13: qty 200

## 2017-03-13 MED ORDER — ACETAMINOPHEN 325 MG PO TABS
650.0000 mg | ORAL_TABLET | ORAL | Status: DC | PRN
  Administered 2017-03-17: 650 mg via ORAL

## 2017-03-13 MED ORDER — IOPAMIDOL (ISOVUE-300) INJECTION 61%
INTRAVENOUS | Status: AC
  Administered 2017-03-13: 40 mL
  Filled 2017-03-13: qty 150

## 2017-03-13 MED ORDER — NITROGLYCERIN 1 MG/10 ML FOR IR/CATH LAB
INTRA_ARTERIAL | Status: AC | PRN
  Administered 2017-03-13: 25 ug via INTRA_ARTERIAL

## 2017-03-13 MED ORDER — ACETAMINOPHEN 160 MG/5ML PO SOLN: 650.0000 mg | ORAL | Status: DC | PRN

## 2017-03-13 MED ORDER — HEPARIN (PORCINE) IN NACL 100-0.45 UNIT/ML-% IJ SOLN
INTRAMUSCULAR | Status: AC
  Filled 2017-03-13: qty 250

## 2017-03-13 MED ORDER — CEFAZOLIN SODIUM-DEXTROSE 2-3 GM-%(50ML) IV SOLR
INTRAVENOUS | Status: DC | PRN
  Administered 2017-03-13: 2 g via INTRAVENOUS

## 2017-03-13 MED ORDER — PHENYLEPHRINE HCL 10 MG/ML IJ SOLN: INTRAVENOUS | Status: DC | PRN

## 2017-03-13 MED ORDER — PHENYLEPHRINE HCL 10 MG/ML IJ SOLN
INTRAVENOUS | Status: DC | PRN
  Administered 2017-03-13: 25 ug/min via INTRAVENOUS

## 2017-03-13 MED ORDER — EPTIFIBATIDE 20 MG/10ML IV SOLN
INTRAVENOUS | Status: AC
  Filled 2017-03-13: qty 10

## 2017-03-13 MED ORDER — IOPAMIDOL (ISOVUE-300) INJECTION 61%
INTRAVENOUS | Status: AC
  Administered 2017-03-13: 12 mL
  Filled 2017-03-13: qty 100

## 2017-03-13 MED ORDER — SODIUM CHLORIDE 0.9 % IV SOLN
INTRAVENOUS | Status: DC
  Administered 2017-03-14: 75 mL/h via INTRAVENOUS
  Administered 2017-03-15 – 2017-03-16 (×2): via INTRAVENOUS

## 2017-03-13 MED ORDER — ONDANSETRON HCL 4 MG/2ML IJ SOLN
INTRAMUSCULAR | Status: AC
  Filled 2017-03-13: qty 2

## 2017-03-13 MED ORDER — MEPERIDINE HCL 25 MG/ML IJ SOLN: 6.2500 mg | INTRAMUSCULAR | Status: DC | PRN

## 2017-03-13 MED ORDER — HYDRALAZINE HCL 20 MG/ML IJ SOLN
INTRAMUSCULAR | Status: DC | PRN
  Administered 2017-03-13: 5 mg via INTRAVENOUS

## 2017-03-13 MED ORDER — FENTANYL CITRATE (PF) 100 MCG/2ML IJ SOLN
INTRAMUSCULAR | Status: DC | PRN
  Administered 2017-03-13: 100 ug via INTRAVENOUS
  Administered 2017-03-13 (×2): 25 ug via INTRAVENOUS

## 2017-03-13 MED ORDER — HEPARIN (PORCINE) IN NACL 100-0.45 UNIT/ML-% IJ SOLN
600.0000 [IU]/h | INTRAMUSCULAR | Status: DC
  Filled 2017-03-13: qty 250

## 2017-03-13 MED ORDER — HYDRALAZINE HCL 20 MG/ML IJ SOLN
5.0000 mg | INTRAMUSCULAR | Status: DC | PRN
  Filled 2017-03-13: qty 1

## 2017-03-13 MED ORDER — DEXAMETHASONE SODIUM PHOSPHATE 10 MG/ML IJ SOLN
INTRAMUSCULAR | Status: DC | PRN
  Administered 2017-03-13: 5 mg via INTRAVENOUS

## 2017-03-13 MED ORDER — FENTANYL CITRATE (PF) 100 MCG/2ML IJ SOLN
25.0000 ug | INTRAMUSCULAR | Status: DC | PRN
  Administered 2017-03-13 (×3): 50 ug via INTRAVENOUS

## 2017-03-13 MED ORDER — PHENYLEPHRINE HCL 10 MG/ML IJ SOLN
INTRAMUSCULAR | Status: DC | PRN
  Administered 2017-03-13: 40 ug via INTRAVENOUS
  Administered 2017-03-13: 80 ug via INTRAVENOUS

## 2017-03-13 MED ORDER — NITROGLYCERIN 1 MG/10 ML FOR IR/CATH LAB
INTRA_ARTERIAL | Status: AC
  Filled 2017-03-13: qty 10

## 2017-03-13 NOTE — Progress Notes (Signed)
Patient ID: Kelsey Hanna, female   DOB: 10/23/1950, 67 y.o.   MRN: [id number] Post  Procedure. Extubated without difficulty. presentlly more awake,responds appropriately . Denies any H/As,N  /V or new visual symptoms.. C/O lower back pain . Denies any chest pains SOB . VS BP 135/70  HR 70s to 80s SR.PaO2 100% on 3Ls O2 via nasal cannula. Neurological  Alert  Oriented to place and space. Speech clear. Blind in Lt eye. No facial asymmetry.Tongue midline. Motor. Mild LT  Pronation drift with Lt hand grip 4/5 variable .(old) LES equal proportional to effort. Decreased  Light touch in Lt arm and leg oldper patient. RT groin soft with no hematoma. Distal pulses DPs palpable and PTs dopplerable. S.Vinton Layson MD

## 2017-03-13 NOTE — Consult Note (Signed)
[place] for [facility] Disease   Reason for Consult: MRSA bacteremia     Referring Physician: Dr. Evangeline Gula   Principal Problem:   Hypotension Active Problems:   Bipolar I disorder ([place])   Essential hypertension   Hypothyroidism   HLD (hyperlipidemia)   Chronic back pain   OSA (obstructive sleep apnea)   Mitral regurgitation   Chronic diastolic CHF (congestive heart failure) (H[facility])   Intracranial aneurysm   Slurred speech   Brain aneurysm   . [MAR Hold] aspirin  325 mg Oral Daily  . [START ON 03/14/2017] aspirin  325 mg Oral Q breakfast  . [MAR Hold] atorvastatin  40 mg Oral QPM  . [MAR Hold] clonazePAM  0.5 mg Oral BID  . [MAR Hold] clopidogrel  75 mg Oral Daily  . [START ON 03/14/2017] clopidogrel  75 mg Oral Q breakfast  . [MAR Hold] gabapentin  100 mg Oral BID  . [MAR Hold] levothyroxine  150 mcg Oral QAC breakfast  . [MAR Hold] linaclotide  145 mcg Oral QAC breakfast  . nitroGLYCERIN      . [MAR Hold] OLANZapine  20 mg Oral QHS    Recommendations: Continue IV vancomycin. Please obtain TEE. Repeat blood cultures tomorrow. Will see again Monday. Will need a PICC line but ideal to wait for negative cultures.   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 Patient denies IVDU. UDS positive. Checked database, prescribed oxy, Ambien, and klonopin by three different providers.    Antibiotics: 1/25 IV Vancomycin >>   Cultures: 1/24 Blood >> MRSA  HPI: Kelsey Hanna is a 67 y.o. female with a pmhx of HTN, HLD, hypothyroidism, OSA, mitral regurg, dCHF, Type II DM, Bipolar type 1, paroxsymal a-fib, and bilateral ICA aneurysms who presented 1/24 with slurred speech and difficulty walking. On arrival to the ED, code stroke was activated. CT head was negative for acute process. CTA  head and neck and CT perfusion study which showed stable aneurysms of her right and left ICA without acute occlusion. Of note, patient was planning for outpatient coiling of her R ICA aneurysms unfortunately had to cancel due to not having appropriate after surgery care available.   Neurology evaluated patient in the ED who did not feel her presentation was consisted with an acute CVA and did not warrant admission from a neurological work up perspective. However she was noted to be hypotensive 83/56 and admitted for this. Initially her symptoms were felt to be secondary to her hypotension from medications and possibly polypharmacy / polysubstance abuse. UDS was positive for benzos/ opiates. Blood cultures collected on admission grew MRSA. She was started on IV vancomycin.   Patient is now s/p coiling of her right ICA aneurysms per IR today.   On chart review, it appears patient has a significant psychiatric history including multiple admissions for bipolar mania, overdose, and sepsis.   Tramadol and ambien verdose 05/09/25  Mania, hypernatremia, sepsis 09/2014 Drug overdose, unresponsive 04/2014 (+benzos/ opiates)  Interval History: Seen in PACU. Requesting more pain medication. Denies fever / chills at home prior to presentation. No skin ulcerations or lesions. Denies IVDU.  Review of Systems: Review of Systems  Constitutional: Negative for chills and fever.  Respiratory: Negative for cough and shortness of breath.   Cardiovascular: Negative for chest pain.  Gastrointestinal: Negative for abdominal pain.  Skin: Negative for rash.  Neurological: Negative for dizziness.    All other systems reviewed and are negative    Past Medical History:  Diagnosis Date  . Anxiety   . Bipolar 1 disorder ([place])   . Cancer [facility])    cancer of uterus...no chemo or radiation  . Chicken pox   . Depression   . Diabetes mellitus    dx within last yr....just takes pills  . Dyslipidemia   . Hypothyroid   .  Retinal detachment   . Sleep apnea     Social History   Tobacco Use  . Smoking status: Current Every Day Smoker    Packs/day: 0.25    Years: 35.00    Pack years: 8.75    Types: Cigarettes    Last attempt to quit: 07/09/1999    Years since quitting: 17.6  . Smokeless tobacco: Never Used  Substance Use Topics  . Alcohol use: No    Alcohol/week: 0.0 oz    Comment: socially  . Drug use: No    Family History  Problem Relation Age of Onset  . Healthy Mother   . Healthy Father     No Known Allergies  Physical Exam:  Vitals:   03/13/17 1359 03/13/17 1400  BP: 125/64   Pulse: (!) 54 (!) 59  Resp: 14 17  Temp: (!) 97.2 F (36.2 C)   SpO2: 100% 100%   Constitutional: Obese, NAD, appears comfortable HEENT: Atraumatic, normocephalic. PERRL, anicteric sclera.  Neck: Supple, trachea midline.  Cardiovascular: RRR, no murmurs, rubs, or gallops.  Pulmonary/Chest: CTAB, no wheezes, rales, or rhonchi. No chest wall abnormalities.  Abdominal: Distended due to habitus but soft, non tender,  +BS.  Extremities: Warm and well perfused. no edema.  Neurological: A&Ox3, CN II - XII grossly intact.  Skin: No rashes or erythema  Psychiatric: Normal mood and affect  Lab Results  Component Value Date   WBC 7.9 03/13/2017   HGB 11.8 (L) 03/13/2017   HCT 36.8 03/13/2017   MCV 90.9 03/13/2017   PLT 218 03/13/2017    Lab Results  Component Value Date   CREATININE 0.75 03/13/2017   BUN 12 03/13/2017   NA 141 03/13/2017   K 4.4 03/13/2017   CL 111 03/13/2017   CO2 22 03/13/2017    Lab Results  Component Value Date   ALT 17 03/12/2017   AST 18 03/12/2017   ALKPHOS 55 03/12/2017     Microbiology: Recent Results (from the past 240 hour(s))  Blood culture (routine x 2)     Status: None (Preliminary result)   Collection Time: 03/12/17  2:35 PM  Result Value Ref Range Status   Specimen Description BLOOD LEFT ARM  Final   Special Requests   Final    BOTTLES DRAWN AEROBIC AND  ANAEROBIC Blood Culture adequate volume   Culture  Setup Time   Final    GRAM POSITIVE COCCI IN CLUSTERS ANAEROBIC BOTTLE ONLY Organism ID to follow CRITICAL RESULT CALLED TO, READ BACK BY AND VERIFIED WITH: N. Batchelder Pharm.D. 12:25 03/13/17 ([email or web address])    Culture GRAM POSITIVE COCCI  Final   Report Status PENDING  Incomplete  Blood Culture ID Panel (Reflexed)     Status: Abnormal   Collection Time: 03/12/17  2:35 PM  Result Value Ref Range Status   Enterococcus species NOT DETECTED NOT DETECTED Final   Listeria monocytogenes NOT DETECTED NOT DETECTED Final   Staphylococcus species DETECTED (A) NOT DETECTED Final    Comment: CRITICAL RESULT  CALLED TO, READ BACK BY AND VERIFIED WITH: N. Batchelder Pharm.D. 12:25 03/13/17 ([email or web address])    Staphylococcus aureus DETECTED (A) NOT DETECTED Final    Comment: Methicillin (oxacillin)-resistant Staphylococcus aureus (MRSA). MRSA is predictably resistant to beta-lactam antibiotics (except ceftaroline). Preferred therapy is vancomycin unless clinically contraindicated. Patient requires contact precautions if  hospitalized. CRITICAL RESULT CALLED TO, READ BACK BY AND VERIFIED WITH: N. Batchelder Pharm.D. 12:25 03/13/17 ([email or web address])    Methicillin resistance DETECTED (A) NOT DETECTED Final    Comment: CRITICAL RESULT CALLED TO, READ BACK BY AND VERIFIED WITH: N. Batchelder Pharm.D. 12:25 03/13/17 ([email or web address])    Streptococcus species NOT DETECTED NOT DETECTED Final   Streptococcus agalactiae NOT DETECTED NOT DETECTED Final   Streptococcus pneumoniae NOT DETECTED NOT DETECTED Final   Streptococcus pyogenes NOT DETECTED NOT DETECTED Final   Acinetobacter baumannii NOT DETECTED NOT DETECTED Final   Enterobacteriaceae species NOT DETECTED NOT DETECTED Final   Enterobacter cloacae complex NOT DETECTED NOT DETECTED Final   Escherichia coli NOT DETECTED NOT DETECTED Final   Klebsiella oxytoca NOT DETECTED NOT DETECTED Final   Klebsiella pneumoniae NOT DETECTED  NOT DETECTED Final   Proteus species NOT DETECTED NOT DETECTED Final   Serratia marcescens NOT DETECTED NOT DETECTED Final   Haemophilus influenzae NOT DETECTED NOT DETECTED Final   Neisseria meningitidis NOT DETECTED NOT DETECTED Final   Pseudomonas aeruginosa NOT DETECTED NOT DETECTED Final   Candida albicans NOT DETECTED NOT DETECTED Final   Candida glabrata NOT DETECTED NOT DETECTED Final   Candida krusei NOT DETECTED NOT DETECTED Final   Candida parapsilosis NOT DETECTED NOT DETECTED Final   Candida tropicalis NOT DETECTED NOT DETECTED Final  Blood culture (routine x 2)     Status: None (Preliminary result)   Collection Time: 03/12/17  2:45 PM  Result Value Ref Range Status   Specimen Description BLOOD RIGHT HAND  Final   Special Requests   Final    BOTTLES DRAWN AEROBIC AND ANAEROBIC Blood Culture adequate volume   Culture NO GROWTH < 24 HOURS  Final   Report Status PENDING  Incomplete    Velna Ochs, MD - [place] for Infectious Disease [facility] [email or web address] [phone] pager  [phone] cell 03/13/2017, 2:08 PM

## 2017-03-13 NOTE — Transfer of Care (Signed)
Immediate Anesthesia Transfer of Care Note  Patient: Kelsey Hanna  Procedure(s) Performed: EMBOLIZATION (N/A )  Patient Location: PACU  Anesthesia Type:General  Level of Consciousness: awake, alert  and oriented  Airway & Oxygen Therapy: Patient Spontanous Breathing and Patient connected to nasal cannula oxygen  Post-op Assessment: Report given to RN, Post -op Vital signs reviewed and stable and Patient moving all extremities X 4  Post vital signs: Reviewed and stable  Last Vitals:  Vitals:   03/13/17 0600 03/13/17 0821  BP: 122/60 (!) 144/65  Pulse: 62   Resp: 20 20  Temp: 36.6 C 36.7 C  SpO2: 96% 100%    Last Pain:  Vitals:   03/13/17 0821  TempSrc: Oral  PainSc:          Complications: No apparent anesthesia complications

## 2017-03-13 NOTE — Anesthesia Procedure Notes (Signed)
Procedure Name: Intubation Date/Time: 03/13/2017 10:00 AM Performed by: Neldon Newport, CRNA Pre-anesthesia Checklist: Timeout performed, Patient being monitored, Suction available, Emergency Drugs available and Patient identified Patient Re-evaluated:Patient Re-evaluated prior to induction Oxygen Delivery Method: Circle system utilized Preoxygenation: Pre-oxygenation with 100% oxygen Induction Type: IV induction Ventilation: Mask ventilation without difficulty, Oral airway inserted - appropriate to patient size and Two handed mask ventilation required Laryngoscope Size: Mac and 3 Grade View: Grade I Tube type: Oral Tube size: 7.0 mm Number of attempts: 1 Placement Confirmation: breath sounds checked- equal and bilateral,  ETT inserted through vocal cords under direct vision and positive ETCO2 Secured at: 21 cm Tube secured with: Tape Dental Injury: Teeth and Oropharynx as per pre-operative assessment

## 2017-03-13 NOTE — Progress Notes (Signed)
Patient taken to PACU. PT A&O x2, did not know date, re-oriented to date. Strengths RUE-5, LUE-5, RLE-5, LLE-5.  Report given to [facility]. Right Groin site C/D/I, no bleeding, no hematoma.

## 2017-03-13 NOTE — Progress Notes (Signed)
PROGRESS NOTE  Kelsey Hanna MPN:[id number] DOB: 05-20-1950 DOA: 03/12/2017 PCP: Patient, No Pcp Per   LOS: 0 days   Brief Narrative / Interim history: 67 y.o. 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 Early January patient was found to have bilateral ICA aneurysms and planned to have IR clipping procedure 1/23, but was unable as postop care was unarranged. 1/24 patient presented to the ED with slurred speech and difficulty walking. Patient denied dizziness, fever, chest pain, fever, chills, numbness/tingling. Code stroke was activated. Noncontrast CT showed no acute process. CT angiogram of the head and neck and a CT perfusion study revealed unchanged aneurysms. Neuro evaluated patient and she was admitted for hypotension.  1/25  IR coiling procedure performed. Blood cultures positive for MRSA. Marland Kitchen  Assessment & Plan: Principal Problem:   Hypotension Active Problems:   Bipolar I disorder (HCC)   Essential hypertension   Hypothyroidism   HLD (hyperlipidemia)   Chronic back pain   OSA (obstructive sleep apnea)   Mitral regurgitation   Chronic diastolic CHF (congestive heart failure) (HCC)   Intracranial aneurysm   Slurred speech  Dysarthria/Generalized weakness Patient with recent stroke in December. At that time CT revealed ganglionic infarct. Possible that recent hypotensive event caused worsening of symptoms.  In ED noncontrast CT head revealed no acute intracranial infarct, unchanged since CT in December.   PT/OT/ST once cleared  Monitor Nero checks  Bilateral ICA aneurysm Upon admission CT angio head/neck revealed stable aneurysms. IR coiling procedure today. Patient on IV heparin Follow up per Vascular Surgery  Hypotension B/P in the 80's on admission. 2L IVF given. Daughter  thought due to medical management. UA positive for Bonzos/opiates. Patient with history of drug overdose. B/P improving. Currently144/64 Continue to monitor  Bacteremia, MRSA No obvious source of infection. WBC, UA, Lactate normal in ED Started on Vancomycin IV ?drug user ID consulted Repeat Blood cultures in 48 hours Order echo to check possible heart etiology  Chronic diastolic heart failure. Echo 01/2017 revealed LVEF 60-75% with mild LVH Daily weights, I&O's, low salt  Hypothyroidism Home medications synthroid 150mg  QD Current TSH 10.396 Increase synthroid 175mg  QD Recheck TSH in 6 weeks  Hyperlipidemia Home med Lipitor 40 mg  Lipid levels acceptable    DVT prophylaxis: IV heparin Code Status: FULL Family Communication: none Disposition Plan: home  Consultants:   Neuro  ID  Procedures:   IR Transcath/Emboliz  Echo   Antimicrobials:  Vancomycin  Subjective: Patient in PACU, recovering from IR procedure. She is requesting more pain medication fro back pain.   Objective: Vitals:   03/13/17 0200 03/13/17 0600 03/13/17 0821 03/13/17 0851  BP: (!) 113/53 122/60 (!) 144/65   Pulse: 70 62    Resp: 20 20 20    Temp: 98.2 F (36.8 C) 97.8 F (36.6 C) 98 F (36.7 C)   TempSrc: Oral Oral Oral   SpO2: 93% 96% 100%   Weight:    90.7 kg (199 lb 16 oz)  Height:    5' 2.99" (1.6 m)    Intake/Output Summary (Last 24 hours) at 03/13/2017 1119 Last data filed at 03/13/2017 1103 Gross per 24 hour  Intake 3752 ml  Output 1550 ml  Net 2202 ml   Filed Weights   03/12/17 1404 03/13/17 0851  Weight: 90.7 kg (200 lb) 90.7 kg (199 lb 16 oz)  Examination:   Unable to complete Physical exam due to bedrest  Constitutional:NAD Respiratory:  Normal respiratory effort. No accessory muscle use.  Cardiovascular: Regular rate and rhythm, no murmurs / rubs / gallops. No LE edema. 2+ pedal pulses.    Neurologic: No focal deficits Psychiatric: Normal judgment and  insight. Alert and oriented x 3. Normal mood.    Data Reviewed: I have independently reviewed following labs and imaging studies  CBC: Recent Labs  Lab 03/12/17 1324 03/12/17 1400 03/13/17 0603  WBC  --  8.6 7.9  NEUTROABS  --  5.0  --   HGB 13.6 11.3* 11.8*  HCT 40.0 35.6* 36.8  MCV  --  91.3 90.9  PLT  --  206 161   Basic Metabolic Panel: Recent Labs  Lab 03/12/17 1324 03/12/17 1400 03/13/17 0603  NA 140 136 141  K 4.4 4.1 4.4  CL 103 103 111  CO2  --  25 22  GLUCOSE 100* 92 99  BUN 13 13 12   CREATININE 0.90 0.93 0.75  CALCIUM  --  8.3* 8.6*   GFR: Estimated Creatinine Clearance: 71.9 mL/min (by C-G formula based on SCr of 0.75 mg/dL). Liver Function Tests: Recent Labs  Lab 03/12/17 1400  AST 18  ALT 17  ALKPHOS 55  BILITOT 0.4  PROT 5.1*  ALBUMIN 3.0*   No results for input(s): LIPASE, AMYLASE in the last 168 hours. No results for input(s): AMMONIA in the last 168 hours. Coagulation Profile: Recent Labs  Lab 03/12/17 1400  INR 1.10   Cardiac Enzymes: No results for input(s): CKTOTAL, CKMB, CKMBINDEX, TROPONINI in the last 168 hours. BNP (last 3 results) No results for input(s): PROBNP in the last 8760 hours. HbA1C: No results for input(s): HGBA1C in the last 72 hours. CBG: Recent Labs  Lab 03/13/17 0620  GLUCAP 106*   Lipid Profile: No results for input(s): CHOL, HDL, LDLCALC, TRIG, CHOLHDL, LDLDIRECT in the last 72 hours. Thyroid Function Tests: Recent Labs    03/12/17 1400  TSH 10.396*   Anemia Panel: No results for input(s): VITAMINB12, FOLATE, FERRITIN, TIBC, IRON, RETICCTPCT in the last 72 hours. Urine analysis:    Component Value Date/Time   COLORURINE YELLOW 03/12/2017 1400   APPEARANCEUR HAZY (A) 03/12/2017 1400   LABSPEC 1.030 03/12/2017 1400   PHURINE 5.0 03/12/2017 1400   GLUCOSEU NEGATIVE 03/12/2017 1400   HGBUR NEGATIVE 03/12/2017 1400   BILIRUBINUR NEGATIVE 03/12/2017 1400   KETONESUR NEGATIVE 03/12/2017 1400    PROTEINUR NEGATIVE 03/12/2017 1400   UROBILINOGEN 0.2 10/20/2014 0814   NITRITE NEGATIVE 03/12/2017 1400   LEUKOCYTESUR NEGATIVE 03/12/2017 1400   Sepsis Labs: Invalid input(s): PROCALCITONIN, LACTICIDVEN  Recent Results (from the past 240 hour(s))  Blood culture (routine x 2)     Status: None (Preliminary result)   Collection Time: 03/12/17  2:35 PM  Result Value Ref Range Status   Specimen Description BLOOD LEFT ARM  Final   Special Requests   Final    BOTTLES DRAWN AEROBIC AND ANAEROBIC Blood Culture adequate volume   Culture  Setup Time   Final    GRAM POSITIVE COCCI IN CLUSTERS ANAEROBIC BOTTLE ONLY Organism ID to follow    Culture GRAM POSITIVE COCCI  Final   Report Status PENDING  Incomplete      Radiology Studies: Ct Angio Head W Or Wo Contrast  Result Date: 03/12/2017 CLINICAL DATA:  67 year old female with abnormal speech and bilateral weakness. Bilateral ICA paraophthalmic aneurysms. EXAM: CT ANGIOGRAPHY HEAD AND NECK CT  PERFUSION BRAIN TECHNIQUE: Multidetector CT imaging of the head and neck was performed using the standard protocol during bolus administration of intravenous contrast. Multiplanar CT image reconstructions and MIPs were obtained to evaluate the vascular anatomy. Carotid stenosis measurements (when applicable) are obtained utilizing NASCET criteria, using the distal internal carotid diameter as the denominator. Multiphase CT imaging of the brain was performed following IV bolus contrast injection. Subsequent parametric perfusion maps were calculated using RAPID software. CONTRAST:  100 milliliters Isovue 370 COMPARISON:  Head CT without contrast 1338 hr today. FINDINGS: CT Brain Perfusion Findings: CBF (<30%) Volume: 0 milliliters Perfusion (Tmax>6.0s) volume: 0 milliliters Mismatch Volume: Not applicablemL Infarction Location:Not applicable CTA NECK Skeleton: Absent dentition. Cervical spine degeneration. Stable visualized osseous structures. Upper chest: Patchy  and dependent pulmonary opacity in the upper lungs is similar to that in December and may reflect a combination of chronic lung disease and atelectasis. No superior mediastinal lymphadenopathy. Other neck: Negative aside from a partially retropharyngeal course of both carotid arteries. No cervical lymphadenopathy. Aortic arch: 3 vessel arch configuration. Stable mild to moderate arch and great vessel origin calcified plaque. Right carotid system: Mildly increased soft plaque in the ventral brachiocephalic artery since December, or might have been obscured by artifact previously. No significant brachiocephalic artery stenosis. No right CCA origin stenosis. Tortuous proximal right CCA. Mild plaque in the right CCA and at the right carotid bifurcation without stenosis. Partially retropharyngeal but otherwise negative cervical right ICA. Left carotid system: Stable calcified plaque at the left CCA origin without stenosis. Intermittent mild plaque in the left CCA. Minimal calcified plaque at the left ICA origin and bulb without stenosis. Mildly tortuous cervical left ICA with a partially retropharyngeal course. Vertebral arteries: Stable tortuosity of the proximal right subclavian artery with a kinked appearance before the right vertebral artery origin. Stable calcified plaque at the right vertebral artery origin with mild stenosis. The right vertebral artery is patent to the skull base without additional stenosis. No proximal left subclavian artery stenosis despite some soft plaque. Normal left vertebral artery origin. Tortuous left V1 segment with a mildly kinked appearance. Patent left vertebral artery to the skull base without additional stenosis. CTA HEAD Posterior circulation: Codominant distal vertebral arteries. Patent left PICA origin. The right AICA appears dominant. Patent vertebrobasilar junction. Patent basilar artery without stenosis. Normal SCA and left PCA origins. Fetal type right PCA origin. Bilateral  PCA branches are within normal limits. Anterior circulation: Both ICA siphons are patent. Mild siphon plaque. No siphon stenosis. 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 This appears to arise from the undersurface of the left supraclinoid segment (paraophthalmic). 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 This aneurysm might incorporate the origin of the right ophthalmic artery, uncertain. Carotid termini are stable and patent. Dominant left ACA A1 segment, the right A1 is diminutive. MCA and ACA origins are normal. Anterior communicating artery and bilateral ACA branches are within normal limits. Left MCA M1 segment, left MCA bifurcation, and left MCA branches are stable and within normal limits. Right MCA M1 segment, bifurcation, and right MCA branches are stable and within normal limits. Venous sinuses: Patent. Anatomic variants: Dominant left and diminutive right ACA A1 segments. Fetal type right PCA origin. Review of the MIP images confirms the above  findings IMPRESSION: 1. Negative for emergent large vessel occlusion. No core infarct or penumbra detected by CTP. Results of #1 were communicated to Dr. Rory Percy at 1411 hr on 1/24/2019by text page via the [facility] messaging system. 2. 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 3. Up to moderate arch and great vessel origin atherosclerosis, but otherwise mild for age atherosclerosis in the head and neck. No significant arterial stenosis identified. 4. Nonspecific patchy opacity in both upper lungs appears similar to that on 01/24/17, perhaps a combination of atelectasis and chronic lung disease. Electronically  Signed   By: Genevie Ann M.D.   On: 03/12/2017 14:24   Ct Angio Neck W Or Wo Contrast  Result Date: 03/12/2017 CLINICAL DATA:  67 year old female with abnormal speech and bilateral weakness. Bilateral ICA paraophthalmic aneurysms. EXAM: CT ANGIOGRAPHY HEAD AND NECK CT PERFUSION BRAIN TECHNIQUE: Multidetector CT imaging of the head and neck was performed using the standard protocol during bolus administration of intravenous contrast. Multiplanar CT image reconstructions and MIPs were obtained to evaluate the vascular anatomy. Carotid stenosis measurements (when applicable) are obtained utilizing NASCET criteria, using the distal internal carotid diameter as the denominator. Multiphase CT imaging of the brain was performed following IV bolus contrast injection. Subsequent parametric perfusion maps were calculated using RAPID software. CONTRAST:  100 milliliters Isovue 370 COMPARISON:  Head CT without contrast 1338 hr today. FINDINGS: CT Brain Perfusion Findings: CBF (<30%) Volume: 0 milliliters Perfusion (Tmax>6.0s) volume: 0 milliliters Mismatch Volume: Not applicablemL Infarction Location:Not applicable CTA NECK Skeleton: Absent dentition. Cervical spine degeneration. Stable visualized osseous structures. Upper chest: Patchy and dependent pulmonary opacity in the upper lungs is similar to that in December and may reflect a combination of chronic lung disease and atelectasis. No superior mediastinal lymphadenopathy. Other neck: Negative aside from a partially retropharyngeal course of both carotid arteries. No cervical lymphadenopathy. Aortic arch: 3 vessel arch configuration. Stable mild to moderate arch and great vessel origin calcified plaque. Right carotid system: Mildly increased soft plaque in the ventral brachiocephalic artery since December, or might have been obscured by artifact previously. No significant brachiocephalic artery stenosis. No right CCA origin stenosis. Tortuous proximal right CCA. Mild plaque  in the right CCA and at the right carotid bifurcation without stenosis. Partially retropharyngeal but otherwise negative cervical right ICA. Left carotid system: Stable calcified plaque at the left CCA origin without stenosis. Intermittent mild plaque in the left CCA. Minimal calcified plaque at the left ICA origin and bulb without stenosis. Mildly tortuous cervical left ICA with a partially retropharyngeal course. Vertebral arteries: Stable tortuosity of the proximal right subclavian artery with a kinked appearance before the right vertebral artery origin. Stable calcified plaque at the right vertebral artery origin with mild stenosis. The right vertebral artery is patent to the skull base without additional stenosis. No proximal left subclavian artery stenosis despite some soft plaque. Normal left vertebral artery origin. Tortuous left V1 segment with a mildly kinked appearance. Patent left vertebral artery to the skull base without additional stenosis. CTA HEAD Posterior circulation: Codominant distal vertebral arteries. Patent left PICA origin. The right AICA appears dominant. Patent vertebrobasilar junction. Patent basilar artery without stenosis. Normal SCA and left PCA origins. Fetal type right PCA origin. Bilateral PCA branches are within normal limits. Anterior circulation: Both ICA siphons are patent. Mild siphon plaque. No siphon stenosis. A 5 millimeter diameter round medially located saccular aneurysm  of the distal left ICA siphon is stable from the 2018 MRA. This appears to arise from the undersurface of the left supraclinoid segment (paraophthalmic). 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 This aneurysm might incorporate the origin of the right ophthalmic artery, uncertain. Carotid termini are stable and patent. Dominant left ACA A1 segment, the right A1 is  diminutive. MCA and ACA origins are normal. Anterior communicating artery and bilateral ACA branches are within normal limits. Left MCA M1 segment, left MCA bifurcation, and left MCA branches are stable and within normal limits. Right MCA M1 segment, bifurcation, and right MCA branches are stable and within normal limits. Venous sinuses: Patent. Anatomic variants: Dominant left and diminutive right ACA A1 segments. Fetal type right PCA origin. Review of the MIP images confirms the above findings IMPRESSION: 1. Negative for emergent large vessel occlusion. No core infarct or penumbra detected by CTP. Results of #1 were communicated to Dr. Rory Percy at 1411 hr on 1/24/2019by text page via the [facility] messaging system. 2. 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 3. Up to moderate arch and great vessel origin atherosclerosis, but otherwise mild for age atherosclerosis in the head and neck. No significant arterial stenosis identified. 4. Nonspecific patchy opacity in both upper lungs appears similar to that on 01/24/17, perhaps a combination of atelectasis and chronic lung disease. Electronically Signed   By: Genevie Ann M.D.   On: 03/12/2017 14:24   Ct Cerebral Perfusion W Contrast  Result Date: 03/12/2017 CLINICAL DATA:  66 year old female with abnormal speech and bilateral weakness. Bilateral ICA paraophthalmic aneurysms. EXAM: CT ANGIOGRAPHY HEAD AND NECK CT PERFUSION BRAIN TECHNIQUE: Multidetector CT imaging of the head and neck was performed using the standard protocol during bolus administration of intravenous contrast. Multiplanar CT image reconstructions and MIPs were obtained to evaluate the vascular anatomy. Carotid stenosis measurements (when applicable) are obtained utilizing NASCET criteria, using the distal internal carotid diameter as the denominator. Multiphase CT  imaging of the brain was performed following IV bolus contrast injection. Subsequent parametric perfusion maps were calculated using RAPID software. CONTRAST:  100 milliliters Isovue 370 COMPARISON:  Head CT without contrast 1338 hr today. FINDINGS: CT Brain Perfusion Findings: CBF (<30%) Volume: 0 milliliters Perfusion (Tmax>6.0s) volume: 0 milliliters Mismatch Volume: Not applicablemL Infarction Location:Not applicable CTA NECK Skeleton: Absent dentition. Cervical spine degeneration. Stable visualized osseous structures. Upper chest: Patchy and dependent pulmonary opacity in the upper lungs is similar to that in December and may reflect a combination of chronic lung disease and atelectasis. No superior mediastinal lymphadenopathy. Other neck: Negative aside from a partially retropharyngeal course of both carotid arteries. No cervical lymphadenopathy. Aortic arch: 3 vessel arch configuration. Stable mild to moderate arch and great vessel origin calcified plaque. Right carotid system: Mildly increased soft plaque in the ventral brachiocephalic artery since December, or might have been obscured by artifact previously. No significant brachiocephalic artery stenosis. No right CCA origin stenosis. Tortuous proximal right CCA. Mild plaque in the right CCA and at the right carotid bifurcation without stenosis. Partially retropharyngeal but otherwise negative cervical right ICA. Left carotid system: Stable calcified plaque at the left CCA origin without stenosis. Intermittent mild plaque in the left CCA. Minimal calcified plaque at the left ICA  origin and bulb without stenosis. Mildly tortuous cervical left ICA with a partially retropharyngeal course. Vertebral arteries: Stable tortuosity of the proximal right subclavian artery with a kinked appearance before the right vertebral artery origin. Stable calcified plaque at the right vertebral artery origin with mild stenosis. The right vertebral artery is patent to the skull  base without additional stenosis. No proximal left subclavian artery stenosis despite some soft plaque. Normal left vertebral artery origin. Tortuous left V1 segment with a mildly kinked appearance. Patent left vertebral artery to the skull base without additional stenosis. CTA HEAD Posterior circulation: Codominant distal vertebral arteries. Patent left PICA origin. The right AICA appears dominant. Patent vertebrobasilar junction. Patent basilar artery without stenosis. Normal SCA and left PCA origins. Fetal type right PCA origin. Bilateral PCA branches are within normal limits. Anterior circulation: Both ICA siphons are patent. Mild siphon plaque. No siphon stenosis. 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 This appears to arise from the undersurface of the left supraclinoid segment (paraophthalmic). 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 This aneurysm might incorporate the origin of the right ophthalmic artery, uncertain. Carotid termini are stable and patent. Dominant left ACA A1 segment, the right A1 is diminutive. MCA and ACA origins are normal. Anterior communicating artery and bilateral ACA branches are within normal limits. Left MCA M1 segment, left MCA bifurcation, and left MCA branches are stable and within normal limits. Right MCA M1 segment, bifurcation, and right MCA branches are stable and within normal limits. Venous sinuses: Patent. Anatomic variants: Dominant left and diminutive right ACA A1 segments. Fetal type right PCA origin. Review of the MIP images confirms the above findings IMPRESSION: 1. Negative for emergent large vessel occlusion. No core infarct or penumbra detected by CTP. Results of #1 were communicated to Dr. Rory Percy at 1411 hr on 1/24/2019by text page via the [facility]  messaging system. 2. 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 3. Up to moderate arch and great vessel origin atherosclerosis, but otherwise mild for age atherosclerosis in the head and neck. No significant arterial stenosis identified. 4. Nonspecific patchy opacity in both upper lungs appears similar to that on 01/24/17, perhaps a combination of atelectasis and chronic lung disease. Electronically Signed   By: Genevie Ann M.D.   On: 03/12/2017 14:24   Dg Chest Port 1 View  Result Date: 03/12/2017 CLINICAL DATA:  Hypotension EXAM: PORTABLE CHEST 1 VIEW COMPARISON:  06/08/2015 FINDINGS: Cardiac shadow is at the upper limits of normal in size but stable. The lungs are well aerated bilaterally. Elevation of left hemidiaphragm is again noted. No focal infiltrate or sizable effusion is seen. No bony abnormality is noted. IMPRESSION: No acute abnormality noted. Electronically Signed   By: Inez Catalina M.D.   On: 03/12/2017 14:36   Ct Head Code Stroke Wo Contrast  Result Date: 03/12/2017 CLINICAL DATA:  Code stroke. 67 year old female with abnormal speech and bilateral weakness. Left ICA paraophthalmic aneurysm. EXAM: CT HEAD WITHOUT CONTRAST TECHNIQUE: Contiguous axial images were obtained from the base of the skull through the vertex without intravenous contrast. COMPARISON:  CTA neck 01/24/2017. Brain MRI and MRA 01/24/2017. Head CT 01/23/2017. Sinuses FINDINGS:  Brain: Stable cerebral volume. Cavum septum pellucidum, normal variant. No ventriculomegaly. No midline shift, mass effect, or evidence of intracranial mass lesion. Patchy bilateral white matter hypodensity is stable. No acute intracranial hemorrhage identified. No cortically based acute infarct identified. Vascular: Calcified atherosclerosis at the skull base. No suspicious intracranial vascular hyperdensity. Skull:  No acute osseous abnormality identified. Hyperostosis frontalis, normal variant. Sinuses/Orbits: Stable and negative. Other: Stable and negative. ASPECTS ([place] Stroke Program Early CT Score) - Ganglionic level infarction (caudate, lentiform nuclei, internal capsule, insula, M1-M3 cortex): 7 - Supraganglionic infarction (M4-M6 cortex): 3 Total score (0-10 with 10 being normal): 10 IMPRESSION: 1. Stable non contrast CT appearance of the brain since December 2018. 2. ASPECTS is 10. 3. These results were communicated to Dr. Rory Percy at [place] 1/24/2019by text page via the [facility] messaging system. Electronically Signed   By: Genevie Ann M.D.   On: 03/12/2017 13:46     Scheduled Meds: . [MAR Hold] aspirin  325 mg Oral Daily  . [MAR Hold] atorvastatin  40 mg Oral QPM  . [MAR Hold] clonazePAM  0.5 mg Oral BID  . [MAR Hold] clopidogrel  75 mg Oral Daily  . [MAR Hold] gabapentin  100 mg Oral BID  . iopamidol      . iopamidol      . iopamidol      . [MAR Hold] levothyroxine  150 mcg Oral QAC breakfast  . [MAR Hold] linaclotide  145 mcg Oral QAC breakfast  . nitroGLYCERIN      . [MAR Hold] OLANZapine  20 mg Oral QHS   Continuous Infusions: . sodium chloride 75 mL/hr at 03/13/17 0700  . ceFAZolin         Time spent:    Dandra Velardi, PA-S

## 2017-03-13 NOTE — Anesthesia Procedure Notes (Signed)
Arterial Line Insertion Start/End1/25/2019 9:15 AM, 03/13/2017 9:22 AM Performed by: Neldon Newport, CRNA, CRNA  Preanesthetic checklist: patient identified, IV checked, site marked, risks and benefits discussed, surgical consent, pre-op evaluation and timeout performed Lidocaine 1% used for infiltration Right, radial was placed Catheter size: 20 G Hand hygiene performed  and maximum sterile barriers used   Attempts: 1 Procedure performed without using ultrasound guided technique. Following insertion, dressing applied and Biopatch. Post procedure assessment: unchanged and normal  Patient tolerated the procedure well with no immediate complications.

## 2017-03-13 NOTE — Progress Notes (Signed)
PHARMACY - PHYSICIAN COMMUNICATION CRITICAL VALUE ALERT - BLOOD CULTURE IDENTIFICATION (BCID)  Kelsey Hanna is an 67 y.o. female who presented to [facility] on 03/12/2017 with a chief complaint of slurred speech  Assessment:   67 yo F presents with slurred speech. Also with hypotension although no real signs of infection. 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 Currently in IR. Afebrile, WBC wnl.  Name of physician (or Provider) Contacted:  R. Comer  Current antibiotics:  None  Changes to prescribed antibiotics recommended:  Start vancomycin  Results for orders placed or performed during the hospital encounter of 03/12/17  Blood Culture ID Panel (Reflexed) (Collected: 03/12/2017  2:35 PM)  Result Value Ref Range   Enterococcus species NOT DETECTED NOT DETECTED   Listeria monocytogenes NOT DETECTED NOT DETECTED   Staphylococcus species DETECTED (A) NOT DETECTED   Staphylococcus aureus DETECTED (A) NOT DETECTED   Methicillin resistance DETECTED (A) NOT DETECTED   Streptococcus species NOT DETECTED NOT DETECTED   Streptococcus agalactiae NOT DETECTED NOT DETECTED   Streptococcus pneumoniae NOT DETECTED NOT DETECTED   Streptococcus pyogenes NOT DETECTED NOT DETECTED   Acinetobacter baumannii NOT DETECTED NOT DETECTED   Enterobacteriaceae species NOT DETECTED NOT DETECTED   Enterobacter cloacae complex NOT DETECTED NOT DETECTED   Escherichia coli NOT DETECTED NOT DETECTED   Klebsiella oxytoca NOT DETECTED NOT DETECTED   Klebsiella pneumoniae NOT DETECTED NOT DETECTED   Proteus species NOT DETECTED NOT DETECTED   Serratia marcescens NOT DETECTED NOT DETECTED   Haemophilus influenzae NOT DETECTED NOT DETECTED   Neisseria meningitidis NOT DETECTED NOT DETECTED   Pseudomonas aeruginosa NOT DETECTED NOT DETECTED   Candida albicans NOT DETECTED NOT DETECTED   Candida glabrata NOT DETECTED NOT DETECTED   Candida krusei NOT DETECTED NOT DETECTED   Candida parapsilosis NOT DETECTED  NOT DETECTED   Candida tropicalis NOT DETECTED NOT DETECTED    Reginia Naas 03/13/2017  12:26 PM

## 2017-03-13 NOTE — Procedures (Signed)
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

## 2017-03-13 NOTE — Anesthesia Postprocedure Evaluation (Signed)
Anesthesia Post Note  Patient: Kelsey Hanna  Procedure(s) Performed: EMBOLIZATION (N/A )     Patient location during evaluation: PACU Anesthesia Type: General Level of consciousness: sedated and patient cooperative Pain management: pain level controlled Vital Signs Assessment: post-procedure vital signs reviewed and stable Respiratory status: spontaneous breathing Cardiovascular status: stable Anesthetic complications: no    Last Vitals:  Vitals:   03/13/17 1530 03/13/17 1600  BP:    Pulse: (!) 56 65  Resp: 11 15  Temp:  (!) 36.1 C  SpO2: 99% 96%    Last Pain:  Vitals:   03/13/17 1530  TempSrc:   PainSc: [place]

## 2017-03-13 NOTE — Progress Notes (Signed)
PT Cancellation Note  Patient Details Name: ZULEIMA HASER MRN: [id number] DOB: 1950/09/29   Cancelled Treatment:    Reason Eval/Treat Not Completed: (P) Patient at procedure or test/unavailable Pt taken to OR for IR coiling of Right IC aneurysm. PT will follow back this afternoon for possible evaluation.   Ivy Puryear B. Migdalia Dk PT, DPT Acute Rehabilitation  [phone] Pager [phone]      [place] 03/13/2017, 8:52 AM

## 2017-03-13 NOTE — Progress Notes (Signed)
Patient ID: Kelsey Hanna, female   DOB: 04-Oct-1950, 67 y.o.   MRN: [id number] INR. Fair night. No new neurological complaints. Denies  recent chest pains ,SOB or palpitations. On neuro exam stable .  P2 Y 12  175 yesterday. HB 11.8  Patient aware of the procedure . Understands the reasons,risks of potential thromboembolic stroke,aneurysm rupture during procedure,with new neurological deficits,need for emergent surgery and death. Patient understands and wants to  proceed with endovascular treatment of Rt sided intracranial aneurysm ,and delayed risk of ICH. Treatment plan also discussed with her daughter,Kelsey Hanna on the phone. She  understands the planned treatment and potential complications and is agreeable with the planned endovascular treatment. S.Delyle Weider MD

## 2017-03-13 NOTE — Progress Notes (Signed)
ANTICOAGULATION CONSULT NOTE  Pharmacy Consult for Heparin Indication: s/p neuro IR procedure  No Known Allergies  Patient Measurements: Height: 5\' 2"  (157.5 cm) Weight: 203 lb 0.7 oz (92.1 kg) IBW/kg (Calculated) : 50.1 Heparin Dosing Weight: 73 kg  Vital Signs: Temp: 97.9 F (36.6 C) (01/25 2000) Temp Source: Axillary (01/25 2000) BP: 129/55 (01/25 1900) Pulse Rate: 63 (01/25 1900)  Labs: Recent Labs    03/12/17 1324 03/12/17 1400 03/13/17 0603 03/13/17 2159  HGB 13.6 11.3* 11.8*  --   HCT 40.0 35.6* 36.8  --   PLT  --  206 218  --   APTT  --  30  --   --   LABPROT  --  14.1  --   --   INR  --  1.10  --   --   HEPARINUNFRC  --   --   --  0.30  CREATININE 0.90 0.93 0.75  --     Estimated Creatinine Clearance: 71.1 mL/min (by C-G formula based on SCr of 0.75 mg/dL).   Medical History: Past Medical History:  Diagnosis Date  . Anxiety   . Bipolar 1 disorder ([place])   . Cancer [facility])    cancer of uterus...no chemo or radiation  . Chicken pox   . Depression   . Diabetes mellitus    dx within last yr....just takes pills  . Dyslipidemia   . Hypothyroid   . Retinal detachment   . Sleep apnea     Medications:  Infusions:  . sodium chloride    . heparin    . heparin    . niCARDipine      Assessment: 67 yo female s/p S/P bilateral common carotid arteriograms,followed by  endovascular treatment of Rt para ophthalmic aneurysm using x 2 pipeline flow diverters.  Pharmacy asked to adjust heparin infusion to target heparin level goal.  PM heparin level slightly above goal at 0.3  Goal of Therapy:  Heparin level 0.1-0.25 (PTT 50-60) Monitor platelets by anticoagulation protocol: Yes   Plan:  1. Reduce heparin to 600 units/hr. 2. Check heparin level in 6 hrs. 3. Heparin off at 7 AM per orders.  Uvaldo Rising, BCPS  Clinical Pharmacist Pager [phone]  03/13/2017 10:46 PM

## 2017-03-13 NOTE — Progress Notes (Signed)
Pharmacy Antibiotic Note  Kelsey Hanna is a 67 y.o. female admitted on 03/12/2017 with slurred speech. Now found to have MRSA. Pharmacy has been consulted for vancomycin dosing.  Plan: Give vancomycin 2g IV x 1, then start vancomycin 1g IV Q12h Monitor clinical picture, renal function, VT at Css F/U C&S, abx deescalation / LOT  Height: 5' 2.99" (160 cm) Weight: 199 lb 16 oz (90.7 kg) IBW/kg (Calculated) : 52.38  Temp (24hrs), Avg:98 F (36.7 C), Min:97.6 F (36.4 C), Max:98.5 F (36.9 C)  Recent Labs  Lab 03/12/17 1324 03/12/17 1400 03/12/17 1412 03/13/17 0603  WBC  --  8.6  --  7.9  CREATININE 0.90 0.93  --  0.75  LATICACIDVEN  --   --  1.51  --     Estimated Creatinine Clearance: 71.9 mL/min (by C-G formula based on SCr of 0.75 mg/dL).    No Known Allergies   Thank you for allowing pharmacy to be a part of this patient's care.  Reginia Naas 03/13/2017 12:32 PM

## 2017-03-13 NOTE — Progress Notes (Signed)
Dr Lissa Hoard unable to start IV, Dr Estanislado Pandy aware and states primary MD can place line

## 2017-03-13 NOTE — Progress Notes (Signed)
CHarge RN Attempted to place second IV woithout success, Research scientist (physical sciences) called to placve so IV Vanco can start

## 2017-03-13 NOTE — Progress Notes (Signed)
Patient assessed this afternoon with Dr. Estanislado Pandy.   Patient with stable vital signs.  She does state she has back pain which is chronic and feels consistent; RN has administered fentanyl.  Neuro exam overall stable; some improvement in her pronator drift from this AM.  Groin soft, no bleeding.   Medical team at bedside attempting to gain second IV for antibiotics due to MRSA bacteremia.  Heparin infusing.   Plan for ICU observation overnight.  Plavix ordered for tomorrow.   Brynda Greathouse, MS RD PA-C 4:25 PM

## 2017-03-13 NOTE — Progress Notes (Signed)
Dr Lissa Hoard called to see if he could start a second IV on pt

## 2017-03-13 NOTE — Progress Notes (Signed)
ANTICOAGULATION CONSULT NOTE - Initial Consult  Pharmacy Consult for Heparin Indication: s/p neuro IR procedure  No Known Allergies  Patient Measurements: Height: 5' 2.99" (160 cm) Weight: 199 lb 16 oz (90.7 kg) IBW/kg (Calculated) : 52.38 Heparin Dosing Weight: 73 kg  Vital Signs: Temp: 97.2 F (36.2 C) (01/25 1359) Temp Source: Oral (01/25 0821) BP: 125/64 (01/25 1359) Pulse Rate: 59 (01/25 1400)  Labs: Recent Labs    03/12/17 1324 03/12/17 1400 03/13/17 0603  HGB 13.6 11.3* 11.8*  HCT 40.0 35.6* 36.8  PLT  --  206 218  APTT  --  30  --   LABPROT  --  14.1  --   INR  --  1.10  --   CREATININE 0.90 0.93 0.75    Estimated Creatinine Clearance: 71.9 mL/min (by C-G formula based on SCr of 0.75 mg/dL).   Medical History: Past Medical History:  Diagnosis Date  . Anxiety   . Bipolar 1 disorder ([place])   . Cancer [facility])    cancer of uterus...no chemo or radiation  . Chicken pox   . Depression   . Diabetes mellitus    dx within last yr....just takes pills  . Dyslipidemia   . Hypothyroid   . Retinal detachment   . Sleep apnea     Medications:  Infusions:  . sodium chloride 75 mL/hr at 03/13/17 0700  . sodium chloride    . ceFAZolin    . heparin    . heparin 500 Units/hr (03/13/17 1409)  . niCARDipine    . vancomycin    . [START ON 03/14/2017] vancomycin      Assessment: 67 yo female s/p S/P bilateral common carotid arteriograms,followed by  endovascular treatment of Rt para ophthalmic aneurysm using x 2 pipeline flow diverters.  Pharmacy asked to adjust heparin infusion to target heparin level goal.  Goal of Therapy:  Heparin level 0.1-0.25 (PTT 50-60) Monitor platelets by anticoagulation protocol: Yes   Plan:  1. Increase IV heparin to 800 units/hr. 2. Check heparin level in 6 hrs. 3. Heparin off at 7 AM per orders.  Uvaldo Rising, BCPS  Clinical Pharmacist Pager [phone]  03/13/2017 2:28 PM

## 2017-03-14 ENCOUNTER — Other Ambulatory Visit: Payer: Self-pay | Admitting: Radiology

## 2017-03-14 ENCOUNTER — Inpatient Hospital Stay (HOSPITAL_COMMUNITY): Payer: Medicare Other

## 2017-03-14 DIAGNOSIS — I361 Nonrheumatic tricuspid (valve) insufficiency: Secondary | ICD-10-CM

## 2017-03-14 DIAGNOSIS — E785 Hyperlipidemia, unspecified: Secondary | ICD-10-CM

## 2017-03-14 DIAGNOSIS — I5032 Chronic diastolic (congestive) heart failure: Secondary | ICD-10-CM

## 2017-03-14 DIAGNOSIS — I671 Cerebral aneurysm, nonruptured: Principal | ICD-10-CM

## 2017-03-14 LAB — ECHOCARDIOGRAM COMPLETE
HEIGHTINCHES: 62 in
WEIGHTICAEL: 3248.7 [oz_av]

## 2017-03-14 LAB — CBC WITH DIFFERENTIAL/PLATELET
BASOS ABS: 0 10*3/uL (ref 0.0–0.1)
Basophils Relative: 0 %
EOS PCT: 0 %
Eosinophils Absolute: 0 10*3/uL (ref 0.0–0.7)
HEMATOCRIT: 30.5 % — AB (ref 36.0–46.0)
Hemoglobin: 10.2 g/dL — ABNORMAL LOW (ref 12.0–15.0)
LYMPHS PCT: 15 %
Lymphs Abs: 2 10*3/uL (ref 0.7–4.0)
MCH: 30 pg (ref 26.0–34.0)
MCHC: 33.4 g/dL (ref 30.0–36.0)
MCV: 89.7 fL (ref 78.0–100.0)
Monocytes Absolute: 0.8 10*3/uL (ref 0.1–1.0)
Monocytes Relative: 6 %
NEUTROS ABS: 10.6 10*3/uL — AB (ref 1.7–7.7)
NEUTROS PCT: 79 %
PLATELETS: 237 10*3/uL (ref 150–400)
RBC: 3.4 MIL/uL — AB (ref 3.87–5.11)
RDW: 14.8 % (ref 11.5–15.5)
WBC: 13.4 10*3/uL — AB (ref 4.0–10.5)

## 2017-03-14 LAB — BASIC METABOLIC PANEL
ANION GAP: 10 (ref 5–15)
BUN: 7 mg/dL (ref 6–20)
CO2: 21 mmol/L — ABNORMAL LOW (ref 22–32)
Calcium: 8.6 mg/dL — ABNORMAL LOW (ref 8.9–10.3)
Chloride: 109 mmol/L (ref 101–111)
Creatinine, Ser: 0.7 mg/dL (ref 0.44–1.00)
GFR calc Af Amer: >60 mL/min (ref >60)
GLUCOSE: 118 mg/dL — AB (ref 65–99)
POTASSIUM: 4.1 mmol/L (ref 3.5–5.1)
Sodium: 140 mmol/L (ref 135–145)

## 2017-03-14 LAB — HIV ANTIBODY (ROUTINE TESTING W REFLEX): HIV Screen 4th Generation wRfx: NONREACTIVE

## 2017-03-14 LAB — HEPARIN LEVEL (UNFRACTIONATED): Heparin Unfractionated: <0.1 IU/mL — ABNORMAL LOW (ref 0.30–0.70)

## 2017-03-14 MED ORDER — MUPIROCIN 2 % EX OINT
1.0000 "application " | TOPICAL_OINTMENT | Freq: Two times a day (BID) | CUTANEOUS | Status: DC
  Administered 2017-03-14 – 2017-03-18 (×8): 1 via NASAL
  Filled 2017-03-14 (×2): qty 22

## 2017-03-14 MED ORDER — SODIUM CHLORIDE 0.9 % IV BOLUS (SEPSIS)
250.0000 mL | Freq: Once | INTRAVENOUS | Status: AC
  Administered 2017-03-14: 250 mL via INTRAVENOUS

## 2017-03-14 MED ORDER — CHLORHEXIDINE GLUCONATE CLOTH 2 % EX PADS
6.0000 | MEDICATED_PAD | Freq: Every day | CUTANEOUS | Status: DC
  Administered 2017-03-14 – 2017-03-17 (×4): 6 via TOPICAL

## 2017-03-14 MED ORDER — ZOLPIDEM TARTRATE 5 MG PO TABS
5.0000 mg | ORAL_TABLET | Freq: Once | ORAL | Status: AC
  Administered 2017-03-14: 5 mg via ORAL
  Filled 2017-03-14: qty 1

## 2017-03-14 MED ORDER — VANCOMYCIN HCL 10 G IV SOLR
2000.0000 mg | Freq: Once | INTRAVENOUS | Status: AC
  Administered 2017-03-14: 2000 mg via INTRAVENOUS
  Filled 2017-03-14: qty 2000

## 2017-03-14 MED ORDER — VANCOMYCIN HCL IN DEXTROSE 1-5 GM/200ML-% IV SOLN
1000.0000 mg | Freq: Two times a day (BID) | INTRAVENOUS | Status: DC
  Administered 2017-03-15 – 2017-03-18 (×8): 1000 mg via INTRAVENOUS
  Filled 2017-03-14 (×10): qty 200

## 2017-03-14 MED ORDER — OXYCODONE HCL 5 MG PO TABS
10.0000 mg | ORAL_TABLET | Freq: Four times a day (QID) | ORAL | Status: DC | PRN
  Administered 2017-03-14 – 2017-03-18 (×10): 10 mg via ORAL
  Filled 2017-03-14 (×10): qty 2

## 2017-03-14 NOTE — Progress Notes (Signed)
  Echocardiogram 2D Echocardiogram has been performed.  Kelsey Hanna 03/14/2017, 2:59 PM

## 2017-03-14 NOTE — Progress Notes (Signed)
Pharmacy Antibiotic Note  Kelsey Hanna is a 67 y.o. female admitted on 03/12/2017 with slurred speech. Now found to have MRSA bacteremia. Pharmacy has been consulted for vancomycin dosing. She was briefly on linezolid PO due to line issues but now changing to vancomycin. She has yet to receive any vancomycin doses.   Plan: Give vancomycin 2g IV x 1, then start vancomycin 1g IV Q12h Monitor clinical picture, renal function, VT at Css F/U C&S, abx deescalation / LOT  Height: 5\' 2"  (157.5 cm) Weight: 203 lb 0.7 oz (92.1 kg) IBW/kg (Calculated) : 50.1  Temp (24hrs), Avg:97.8 F (36.6 C), Min:97 F (36.1 C), Max:98.5 F (36.9 C)  Recent Labs  Lab 03/12/17 1324 03/12/17 1400 03/12/17 1412 03/13/17 0603 03/14/17 0423  WBC  --  8.6  --  7.9 13.4*  CREATININE 0.90 0.93  --  0.75 0.70  LATICACIDVEN  --   --  1.51  --   --     Estimated Creatinine Clearance: 71.1 mL/min (by C-G formula based on SCr of 0.7 mg/dL).    No Known Allergies   Thank you for allowing pharmacy to be a part of this patient's care.  Zhamir Pirro, Rande Lawman 03/14/2017 11:24 AM

## 2017-03-14 NOTE — Progress Notes (Signed)
MD made aware of patient only having one IV access currently. Patient is a hard stick and IV team unable to get an additional IV. RN will continue to monitor and update.

## 2017-03-14 NOTE — Progress Notes (Signed)
Patient remained non-compliant with keeping her right leg straight the entire shift. Patient was educated on the importance of keeping her leg straight, even with the development of a hematoma. MD was made aware. RN will continue to monitor.

## 2017-03-14 NOTE — Progress Notes (Signed)
Subjective:  C.o pain in groin due to hematoma  Antibiotics:  Anti-infectives (From admission, onward)   Start     Dose/Rate Route Frequency Ordered Stop   03/14/17 0300  vancomycin (VANCOCIN) IVPB 1000 mg/200 mL premix  Status:  Discontinued     1,000 mg 200 mL/hr over 60 Minutes Intravenous Every 12 hours 03/13/17 1249 03/13/17 1802   03/13/17 2200  linezolid (ZYVOX) tablet 600 mg     600 mg Oral Every 12 hours 03/13/17 1751     03/13/17 1500  vancomycin (VANCOCIN) 2,000 mg in sodium chloride 0.9 % 500 mL IVPB  Status:  Discontinued     2,000 mg 250 mL/hr over 120 Minutes Intravenous  Once 03/13/17 1418 03/13/17 1802   03/13/17 1300  vancomycin (VANCOCIN) 2,000 mg in sodium chloride 0.9 % 500 mL IVPB  Status:  Discontinued     2,000 mg 250 mL/hr over 120 Minutes Intravenous  Once 03/13/17 1231 03/13/17 1418   03/13/17 1021  ceFAZolin (ANCEF) 2-4 GM/100ML-% IVPB    Comments:  [name]   : cabinet override      03/13/17 1021 03/13/17 2229      Medications: Scheduled Meds: . aspirin  325 mg Oral Q breakfast  . atorvastatin  40 mg Oral QPM  . Chlorhexidine Gluconate Cloth  6 each Topical Q0600  . clonazePAM  0.5 mg Oral BID  . clopidogrel  75 mg Oral Q breakfast  . gabapentin  100 mg Oral BID  . levothyroxine  150 mcg Oral QAC breakfast  . linaclotide  145 mcg Oral QAC breakfast  . linezolid  600 mg Oral Q12H  . mupirocin ointment  1 application Nasal BID  . OLANZapine  20 mg Oral QHS   Continuous Infusions: . sodium chloride 75 mL/hr (03/14/17 0800)  . niCARDipine     PRN Meds:.acetaminophen **OR** acetaminophen (TYLENOL) oral liquid 160 mg/5 mL **OR** acetaminophen, acetaminophen **OR** acetaminophen, docusate sodium, hydrALAZINE, HYDROmorphone (DILAUDID) injection, ondansetron **OR** ondansetron (ZOFRAN) IV, oxyCODONE, tiZANidine    Objective: Weight change: -0 oz (-0 kg)  Intake/Output Summary (Last 24 hours) at 03/14/2017 1106 Last data filed  at 03/14/2017 0900 Gross per 24 hour  Intake 1944.25 ml  Output 2900 ml  Net -955.75 ml   Blood pressure (!) 126/58, pulse 93, temperature 98.5 F (36.9 C), temperature source Oral, resp. rate (!) 8, height 5\' 2"  (1.575 m), weight 203 lb 0.7 oz (92.1 kg), SpO2 96 %. Temp:  [97 F (36.1 C)-98.5 F (36.9 C)] 98.5 F (36.9 C) (01/26 0800) Pulse Rate:  [54-104] 93 (01/26 0930) Resp:  [0-24] 8 (01/26 0930) BP: (101-145)/(53-108) 126/58 (01/26 0930) SpO2:  [94 %-100 %] 96 % (01/26 0930) Arterial Line BP: (118-178)/(50-88) 118/84 (01/26 0930) Weight:  [203 lb 0.7 oz (92.1 kg)] 203 lb 0.7 oz (92.1 kg) (01/25 1627)  Physical Exam: General: Alert and awake, oriented, not in any acute distress. HEENT: anicteric sclera, pupils reactive to light and accommodation, EOMI CVS regular rate, normal r,  no murmur rubs or gallops Chest: no wheezing, rales or rhonchi Abdomen: softnondistended Skin: bandage on groin Neuro: nonfocal  CBC:  CBC Latest Ref Rng & Units 03/14/2017 03/13/2017 03/12/2017  WBC 4.0 - 10.5 K/uL 13.4(H) 7.9 8.6  Hemoglobin 12.0 - 15.0 g/dL 10.2(L) 11.8(L) 11.3(L)  Hematocrit 36.0 - 46.0 % 30.5(L) 36.8 35.6(L)  Platelets 150 - 400 K/uL 237 218 206      BMET Recent Labs    03/13/17  0603 03/14/17 0423  NA 141 140  K 4.4 4.1  CL 111 109  CO2 22 21*  GLUCOSE 99 118*  BUN 12 7  CREATININE 0.75 0.70  CALCIUM 8.6* 8.6*     Liver Panel  Recent Labs    03/12/17 1400  PROT 5.1*  ALBUMIN 3.0*  AST 18  ALT 17  ALKPHOS 55  BILITOT 0.4       Sedimentation Rate No results for input(s): ESRSEDRATE in the last 72 hours. C-Reactive Protein No results for input(s): CRP in the last 72 hours.  Micro Results: Recent Results (from the past 720 hour(s))  Blood culture (routine x 2)     Status: Abnormal (Preliminary result)   Collection Time: 03/12/17  2:35 PM  Result Value Ref Range Status   Specimen Description BLOOD LEFT ARM  Final   Special Requests   Final     BOTTLES DRAWN AEROBIC AND ANAEROBIC Blood Culture adequate volume   Culture  Setup Time   Final    GRAM POSITIVE COCCI IN CLUSTERS ANAEROBIC BOTTLE ONLY CRITICAL RESULT CALLED TO, READ BACK BY AND VERIFIED WITH: N. Batchelder Pharm.D. 12:25 03/13/17 ([email or web address])    Culture (A)  Final    STAPHYLOCOCCUS AUREUS SUSCEPTIBILITIES TO FOLLOW    Report Status PENDING  Incomplete  Blood Culture ID Panel (Reflexed)     Status: Abnormal   Collection Time: 03/12/17  2:35 PM  Result Value Ref Range Status   Enterococcus species NOT DETECTED NOT DETECTED Final   Listeria monocytogenes NOT DETECTED NOT DETECTED Final   Staphylococcus species DETECTED (A) NOT DETECTED Final    Comment: CRITICAL RESULT CALLED TO, READ BACK BY AND VERIFIED WITH: N. Batchelder Pharm.D. 12:25 03/13/17 ([email or web address])    Staphylococcus aureus DETECTED (A) NOT DETECTED Final    Comment: Methicillin (oxacillin)-resistant Staphylococcus aureus (MRSA). MRSA is predictably resistant to beta-lactam antibiotics (except ceftaroline). Preferred therapy is vancomycin unless clinically contraindicated. Patient requires contact precautions if  hospitalized. CRITICAL RESULT CALLED TO, READ BACK BY AND VERIFIED WITH: N. Batchelder Pharm.D. 12:25 03/13/17 ([email or web address])    Methicillin resistance DETECTED (A) NOT DETECTED Final    Comment: CRITICAL RESULT CALLED TO, READ BACK BY AND VERIFIED WITH: N. Batchelder Pharm.D. 12:25 03/13/17 ([email or web address])    Streptococcus species NOT DETECTED NOT DETECTED Final   Streptococcus agalactiae NOT DETECTED NOT DETECTED Final   Streptococcus pneumoniae NOT DETECTED NOT DETECTED Final   Streptococcus pyogenes NOT DETECTED NOT DETECTED Final   Acinetobacter baumannii NOT DETECTED NOT DETECTED Final   Enterobacteriaceae species NOT DETECTED NOT DETECTED Final   Enterobacter cloacae complex NOT DETECTED NOT DETECTED Final   Escherichia coli NOT DETECTED NOT DETECTED Final   Klebsiella oxytoca NOT DETECTED NOT DETECTED  Final   Klebsiella pneumoniae NOT DETECTED NOT DETECTED Final   Proteus species NOT DETECTED NOT DETECTED Final   Serratia marcescens NOT DETECTED NOT DETECTED Final   Haemophilus influenzae NOT DETECTED NOT DETECTED Final   Neisseria meningitidis NOT DETECTED NOT DETECTED Final   Pseudomonas aeruginosa NOT DETECTED NOT DETECTED Final   Candida albicans NOT DETECTED NOT DETECTED Final   Candida glabrata NOT DETECTED NOT DETECTED Final   Candida krusei NOT DETECTED NOT DETECTED Final   Candida parapsilosis NOT DETECTED NOT DETECTED Final   Candida tropicalis NOT DETECTED NOT DETECTED Final  Blood culture (routine x 2)     Status: None (Preliminary result)   Collection Time: 03/12/17  2:45 PM  Result Value Ref Range Status   Specimen Description BLOOD  RIGHT HAND  Final   Special Requests   Final    BOTTLES DRAWN AEROBIC AND ANAEROBIC Blood Culture adequate volume   Culture NO GROWTH < 24 HOURS  Final   Report Status PENDING  Incomplete  MRSA PCR Screening     Status: Abnormal   Collection Time: 03/13/17  4:29 PM  Result Value Ref Range Status   MRSA by PCR POSITIVE (A) NEGATIVE Final    Comment:        The GeneXpert MRSA Assay (FDA approved for NASAL specimens only), is one component of a comprehensive MRSA colonization surveillance program. It is not intended to diagnose MRSA infection nor to guide or monitor treatment for MRSA infections. CRITICAL RESULT CALLED TO, READ BACK BY AND VERIFIED WITH: Melburn Hake, RN AT 2015 ON 03/13/17 BY C. JESSUP, MLT.     Studies/Results: Ct Angio Head W Or Wo Contrast  Result Date: 03/12/2017 CLINICAL DATA:  67 year old female with abnormal speech and bilateral weakness. Bilateral ICA paraophthalmic aneurysms. EXAM: CT ANGIOGRAPHY HEAD AND NECK CT PERFUSION BRAIN TECHNIQUE: Multidetector CT imaging of the head and neck was performed using the standard protocol during bolus administration of intravenous contrast. Multiplanar CT image  reconstructions and MIPs were obtained to evaluate the vascular anatomy. Carotid stenosis measurements (when applicable) are obtained utilizing NASCET criteria, using the distal internal carotid diameter as the denominator. Multiphase CT imaging of the brain was performed following IV bolus contrast injection. Subsequent parametric perfusion maps were calculated using RAPID software. CONTRAST:  100 milliliters Isovue 370 COMPARISON:  Head CT without contrast 1338 hr today. FINDINGS: CT Brain Perfusion Findings: CBF (<30%) Volume: 0 milliliters Perfusion (Tmax>6.0s) volume: 0 milliliters Mismatch Volume: Not applicablemL Infarction Location:Not applicable CTA NECK Skeleton: Absent dentition. Cervical spine degeneration. Stable visualized osseous structures. Upper chest: Patchy and dependent pulmonary opacity in the upper lungs is similar to that in December and may reflect a combination of chronic lung disease and atelectasis. No superior mediastinal lymphadenopathy. Other neck: Negative aside from a partially retropharyngeal course of both carotid arteries. No cervical lymphadenopathy. Aortic arch: 3 vessel arch configuration. Stable mild to moderate arch and great vessel origin calcified plaque. Right carotid system: Mildly increased soft plaque in the ventral brachiocephalic artery since December, or might have been obscured by artifact previously. No significant brachiocephalic artery stenosis. No right CCA origin stenosis. Tortuous proximal right CCA. Mild plaque in the right CCA and at the right carotid bifurcation without stenosis. Partially retropharyngeal but otherwise negative cervical right ICA. Left carotid system: Stable calcified plaque at the left CCA origin without stenosis. Intermittent mild plaque in the left CCA. Minimal calcified plaque at the left ICA origin and bulb without stenosis. Mildly tortuous cervical left ICA with a partially retropharyngeal course. Vertebral arteries: Stable tortuosity  of the proximal right subclavian artery with a kinked appearance before the right vertebral artery origin. Stable calcified plaque at the right vertebral artery origin with mild stenosis. The right vertebral artery is patent to the skull base without additional stenosis. No proximal left subclavian artery stenosis despite some soft plaque. Normal left vertebral artery origin. Tortuous left V1 segment with a mildly kinked appearance. Patent left vertebral artery to the skull base without additional stenosis. CTA HEAD Posterior circulation: Codominant distal vertebral arteries. Patent left PICA origin. The right AICA appears dominant. Patent vertebrobasilar junction. Patent basilar artery without stenosis. Normal SCA and left PCA origins. Fetal type right PCA origin. Bilateral PCA branches are within normal limits. Anterior circulation: Both ICA  siphons are patent. Mild siphon plaque. No siphon stenosis. 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 This appears to arise from the undersurface of the left supraclinoid segment (paraophthalmic). 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 This aneurysm might incorporate the origin of the right ophthalmic artery, uncertain. Carotid termini are stable and patent. Dominant left ACA A1 segment, the right A1 is diminutive. MCA and ACA origins are normal. Anterior communicating artery and bilateral ACA branches are within normal limits. Left MCA M1 segment, left MCA bifurcation, and left MCA branches are stable and within normal limits. Right MCA M1 segment, bifurcation, and right MCA branches are stable and within normal limits. Venous sinuses: Patent. Anatomic variants: Dominant left and diminutive right ACA A1 segments. Fetal type right PCA origin. Review of the  MIP images confirms the above findings IMPRESSION: 1. Negative for emergent large vessel occlusion. No core infarct or penumbra detected by CTP. Results of #1 were communicated to Dr. Rory Percy at 1411 hr on 1/24/2019by text page via the [facility] messaging system. 2. 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 3. Up to moderate arch and great vessel origin atherosclerosis, but otherwise mild for age atherosclerosis in the head and neck. No significant arterial stenosis identified. 4. Nonspecific patchy opacity in both upper lungs appears similar to that on 01/24/17, perhaps a combination of atelectasis and chronic lung disease. Electronically Signed   By: Genevie Ann M.D.   On: 03/12/2017 14:24   Ct Angio Neck W Or Wo Contrast  Result Date: 03/12/2017 CLINICAL DATA:  67 year old female with abnormal speech and bilateral weakness. Bilateral ICA paraophthalmic aneurysms. EXAM: CT ANGIOGRAPHY HEAD AND NECK CT PERFUSION BRAIN TECHNIQUE: Multidetector CT imaging of the head and neck was performed using the standard protocol during bolus administration of intravenous contrast. Multiplanar CT image reconstructions and MIPs were obtained to evaluate the vascular anatomy. Carotid stenosis measurements (when applicable) are obtained utilizing NASCET criteria, using the distal internal carotid diameter as the denominator. Multiphase CT imaging of the brain was performed following IV bolus contrast injection. Subsequent parametric perfusion maps were calculated using RAPID software. CONTRAST:  100 milliliters Isovue 370 COMPARISON:  Head CT without contrast 1338 hr today. FINDINGS: CT Brain Perfusion Findings: CBF (<30%) Volume: 0 milliliters Perfusion (Tmax>6.0s) volume: 0 milliliters Mismatch Volume: Not applicablemL Infarction Location:Not applicable CTA NECK Skeleton: Absent dentition.  Cervical spine degeneration. Stable visualized osseous structures. Upper chest: Patchy and dependent pulmonary opacity in the upper lungs is similar to that in December and may reflect a combination of chronic lung disease and atelectasis. No superior mediastinal lymphadenopathy. Other neck: Negative aside from a partially retropharyngeal course of both carotid arteries. No cervical lymphadenopathy. Aortic arch: 3 vessel arch configuration. Stable mild to moderate arch and great vessel origin calcified plaque. Right carotid system: Mildly increased soft plaque in the ventral brachiocephalic artery since December, or might have been obscured by artifact previously. No significant brachiocephalic artery stenosis. No right CCA origin stenosis. Tortuous proximal right CCA. Mild plaque in the right CCA and at the right carotid bifurcation without stenosis. Partially retropharyngeal but otherwise negative cervical right ICA. Left carotid system: Stable calcified plaque at  the left CCA origin without stenosis. Intermittent mild plaque in the left CCA. Minimal calcified plaque at the left ICA origin and bulb without stenosis. Mildly tortuous cervical left ICA with a partially retropharyngeal course. Vertebral arteries: Stable tortuosity of the proximal right subclavian artery with a kinked appearance before the right vertebral artery origin. Stable calcified plaque at the right vertebral artery origin with mild stenosis. The right vertebral artery is patent to the skull base without additional stenosis. No proximal left subclavian artery stenosis despite some soft plaque. Normal left vertebral artery origin. Tortuous left V1 segment with a mildly kinked appearance. Patent left vertebral artery to the skull base without additional stenosis. CTA HEAD Posterior circulation: Codominant distal vertebral arteries. Patent left PICA origin. The right AICA appears dominant. Patent vertebrobasilar junction. Patent basilar artery  without stenosis. Normal SCA and left PCA origins. Fetal type right PCA origin. Bilateral PCA branches are within normal limits. Anterior circulation: Both ICA siphons are patent. Mild siphon plaque. No siphon stenosis. 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 This appears to arise from the undersurface of the left supraclinoid segment (paraophthalmic). 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 This aneurysm might incorporate the origin of the right ophthalmic artery, uncertain. Carotid termini are stable and patent. Dominant left ACA A1 segment, the right A1 is diminutive. MCA and ACA origins are normal. Anterior communicating artery and bilateral ACA branches are within normal limits. Left MCA M1 segment, left MCA bifurcation, and left MCA branches are stable and within normal limits. Right MCA M1 segment, bifurcation, and right MCA branches are stable and within normal limits. Venous sinuses: Patent. Anatomic variants: Dominant left and diminutive right ACA A1 segments. Fetal type right PCA origin. Review of the MIP images confirms the above findings IMPRESSION: 1. Negative for emergent large vessel occlusion. No core infarct or penumbra detected by CTP. Results of #1 were communicated to Dr. Rory Percy at 1411 hr on 1/24/2019by text page via the [facility] messaging system. 2. 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 3. Up to moderate arch and great vessel origin atherosclerosis, but otherwise mild for age atherosclerosis in the head and neck. No significant arterial stenosis identified. 4. Nonspecific patchy opacity in both upper lungs appears similar to that on  01/24/17, perhaps a combination of atelectasis and chronic lung disease. Electronically Signed   By: Genevie Ann M.D.   On: 03/12/2017 14:24   Ct Cerebral Perfusion W Contrast  Result Date: 03/12/2017 CLINICAL DATA:  67 year old female with abnormal speech and bilateral weakness. Bilateral ICA paraophthalmic aneurysms. EXAM: CT ANGIOGRAPHY HEAD AND NECK CT PERFUSION BRAIN TECHNIQUE: Multidetector CT imaging of the head and neck was performed using the standard protocol during bolus administration of intravenous contrast. Multiplanar CT image reconstructions and MIPs were obtained to evaluate the vascular anatomy. Carotid stenosis measurements (when applicable) are obtained utilizing NASCET criteria, using the distal internal carotid diameter as the denominator. Multiphase CT imaging of the brain was performed following IV bolus contrast injection. Subsequent parametric perfusion maps were calculated using RAPID software. CONTRAST:  100 milliliters Isovue 370 COMPARISON:  Head CT without contrast 1338 hr today. FINDINGS: CT Brain Perfusion  Findings: CBF (<30%) Volume: 0 milliliters Perfusion (Tmax>6.0s) volume: 0 milliliters Mismatch Volume: Not applicablemL Infarction Location:Not applicable CTA NECK Skeleton: Absent dentition. Cervical spine degeneration. Stable visualized osseous structures. Upper chest: Patchy and dependent pulmonary opacity in the upper lungs is similar to that in December and may reflect a combination of chronic lung disease and atelectasis. No superior mediastinal lymphadenopathy. Other neck: Negative aside from a partially retropharyngeal course of both carotid arteries. No cervical lymphadenopathy. Aortic arch: 3 vessel arch configuration. Stable mild to moderate arch and great vessel origin calcified plaque. Right carotid system: Mildly increased soft plaque in the ventral brachiocephalic artery since December, or might have been obscured by artifact previously. No significant  brachiocephalic artery stenosis. No right CCA origin stenosis. Tortuous proximal right CCA. Mild plaque in the right CCA and at the right carotid bifurcation without stenosis. Partially retropharyngeal but otherwise negative cervical right ICA. Left carotid system: Stable calcified plaque at the left CCA origin without stenosis. Intermittent mild plaque in the left CCA. Minimal calcified plaque at the left ICA origin and bulb without stenosis. Mildly tortuous cervical left ICA with a partially retropharyngeal course. Vertebral arteries: Stable tortuosity of the proximal right subclavian artery with a kinked appearance before the right vertebral artery origin. Stable calcified plaque at the right vertebral artery origin with mild stenosis. The right vertebral artery is patent to the skull base without additional stenosis. No proximal left subclavian artery stenosis despite some soft plaque. Normal left vertebral artery origin. Tortuous left V1 segment with a mildly kinked appearance. Patent left vertebral artery to the skull base without additional stenosis. CTA HEAD Posterior circulation: Codominant distal vertebral arteries. Patent left PICA origin. The right AICA appears dominant. Patent vertebrobasilar junction. Patent basilar artery without stenosis. Normal SCA and left PCA origins. Fetal type right PCA origin. Bilateral PCA branches are within normal limits. Anterior circulation: Both ICA siphons are patent. Mild siphon plaque. No siphon stenosis. 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 This appears to arise from the undersurface of the left supraclinoid segment (paraophthalmic). 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 This aneurysm might incorporate the origin of the right ophthalmic  artery, uncertain. Carotid termini are stable and patent. Dominant left ACA A1 segment, the right A1 is diminutive. MCA and ACA origins are normal. Anterior communicating artery and bilateral ACA branches are within normal limits. Left MCA M1 segment, left MCA bifurcation, and left MCA branches are stable and within normal limits. Right MCA M1 segment, bifurcation, and right MCA branches are stable and within normal limits. Venous sinuses: Patent. Anatomic variants: Dominant left and diminutive right ACA A1 segments. Fetal type right PCA origin. Review of the MIP images confirms the above findings IMPRESSION: 1. Negative for emergent large vessel occlusion. No core infarct or penumbra detected by CTP. Results of #1 were communicated to Dr. Rory Percy at 1411 hr on 1/24/2019by text page via the [facility] messaging system. 2. 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 3. Up to moderate arch and great vessel origin atherosclerosis, but otherwise mild for age atherosclerosis in the head and neck. No significant arterial stenosis identified. 4. Nonspecific patchy opacity in both  upper lungs appears similar to that on 01/24/17, perhaps a combination of atelectasis and chronic lung disease. Electronically Signed   By: Genevie Ann M.D.   On: 03/12/2017 14:24   Dg Chest Port 1 View  Result Date: 03/12/2017 CLINICAL DATA:  Hypotension EXAM: PORTABLE CHEST 1 VIEW COMPARISON:  06/08/2015 FINDINGS: Cardiac shadow is at the upper limits of normal in size but stable. The lungs are well aerated bilaterally. Elevation of left hemidiaphragm is again noted. No focal infiltrate or sizable effusion is seen. No bony abnormality is noted. IMPRESSION: No acute abnormality noted. Electronically Signed   By: Inez Catalina M.D.   On: 03/12/2017 14:36   Ct Head Code Stroke Wo Contrast  Result Date:  03/12/2017 CLINICAL DATA:  Code stroke. 67 year old female with abnormal speech and bilateral weakness. Left ICA paraophthalmic aneurysm. EXAM: CT HEAD WITHOUT CONTRAST TECHNIQUE: Contiguous axial images were obtained from the base of the skull through the vertex without intravenous contrast. COMPARISON:  CTA neck 01/24/2017. Brain MRI and MRA 01/24/2017. Head CT 01/23/2017. Sinuses FINDINGS: Brain: Stable cerebral volume. Cavum septum pellucidum, normal variant. No ventriculomegaly. No midline shift, mass effect, or evidence of intracranial mass lesion. Patchy bilateral white matter hypodensity is stable. No acute intracranial hemorrhage identified. No cortically based acute infarct identified. Vascular: Calcified atherosclerosis at the skull base. No suspicious intracranial vascular hyperdensity. Skull: No acute osseous abnormality identified. Hyperostosis frontalis, normal variant. Sinuses/Orbits: Stable and negative. Other: Stable and negative. ASPECTS (Harris Stroke Program Early CT Score) - Ganglionic level infarction (caudate, lentiform nuclei, internal capsule, insula, M1-M3 cortex): 7 - Supraganglionic infarction (M4-M6 cortex): 3 Total score (0-10 with 10 being normal): 10 IMPRESSION: 1. Stable non contrast CT appearance of the brain since December 2018. 2. ASPECTS is 10. 3. These results were communicated to Dr. Rory Percy at [facility] 1/24/2019by text page via the [facility] messaging system. Electronically Signed   By: Genevie Ann M.D.   On: 03/12/2017 13:46      Assessment/Plan:  INTERVAL HISTORY: changed to po zyvox   Principal Problem:   Hypotension Active Problems:   Bipolar I disorder (HCC)   Essential hypertension   Hypothyroidism   HLD (hyperlipidemia)   Chronic back pain   OSA (obstructive sleep apnea)   Mitral regurgitation   Chronic diastolic CHF (congestive heart failure) ([place])   Intracranial aneurysm   Slurred speech   Brain aneurysm   Internal carotid aneurysm    Kelsey Hanna is a 67 y.o. female with HTN, HLD, oSA, CHV, DM admitted and found to have aneurysms sp coling of right ICA aneurysm. One of two blood cultures  + MRSA = pathogen here  #1. MRSA bacteremia: overnight could only use IV for heparin so changed to zyvox po       [place] [facility] Team Staphylococcus aureus bacteremia   Staphylococcus aureus bacteremia (SAB) is associated with a high rate of complications and mortality.  Specific aspects of clinical management are critical to optimizing the outcome of patients with SAB.  Therefore, the [facility] Antimicrobial Management Team [facility] has initiated an intervention aimed at improving the management of SAB at [facility].  To do so, Infectious Diseases physicians are providing an evidence-based consult for the management of all patients with SAB.     Yes No Comments  Perform follow-up blood cultures (even if the patient is afebrile) to ensure clearance of bacteremia [x]  []  Rechecking today  Remove vascular catheter and obtain follow-up blood cultures after the removal  of the catheter []  []  DO NOT PLACE PICC OR CENTRAL LINE UNTIL WE have proven that repeat blood cultures are NO GROWTH at 72 hours minimum  Perform echocardiography to evaluate for endocarditis (transthoracic ECHO is 40-50% sensitive, TEE is > 90% sensitive) [x]  []  Please keep in mind, that neither test can definitively EXCLUDE endocarditis, and that should clinical suspicion remain high for endocarditis the patient should then still be treated with an "endocarditis" duration of therapy = 6 weeks  She needs TEE if possible  Consult electrophysiologist to evaluate implanted cardiac device (pacemaker, ICD) []  []    Ensure source control [x]  []  Have all abscesses been drained effectively? Have deep seeded infections (septic joints or osteomyelitis) had appropriate surgical debridement?  Investigate for "metastatic" sites of infection [x]  []  Does the patient have ANY  symptom or physical exam finding that would suggest a deeper infection (back or neck pain that may be suggestive of vertebral osteomyelitis or epidural abscess, muscle pain that could be a symptom of pyomyositis)?  Keep in mind that for deep seeded infections MRI imaging with contrast is preferred rather than other often insensitive tests such as plain x-rays, especially early in a patient's presentation.  Change antibiotic therapy to vancomycin if she can have it now with IV , otherwise can go to zyvox again []  []  Beta-lactam antibiotics are preferred for MSSA due to higher cure rates.   If on Vancomycin, goal trough should be 15 - 20 mcg/mL  Estimated duration of IV antibiotic therapy:  4-6 weeks since I do not think this is likely to be categorized as "uncomplicated SAB. Ie no clear removable focus. Though we can see if we can clear cultures within 48 hrs and exclude endocarditis with TEE []  []  Consult case management for probably prolonged outpatient IV antibiotic therapy      LOS: 1 day   Alcide Evener 03/14/2017, 11:06 AM

## 2017-03-14 NOTE — Progress Notes (Signed)
MD paged to notify of patient right femoral site forming a hematoma. Per MD, hold pressure 30 minutes and hold heparin gtt for 1 hour. RN will continue to monitor

## 2017-03-14 NOTE — Progress Notes (Signed)
[facility]   Patient stable, had hematoma at the site of procedure. Patient remains in the ICU, patient MRSA bacteremia, ID following and managing. HTN remains stable. Chronic back pain, recommend to continue oxy PO and avoids IV narcotics. Since patient continues to be in the ICU and medical problems are stable. [facility] will sign off at this time. If patient requires acute medical management can consult PCCM as we have closed ICU.   Please call back [facility] when patient is transferred if medical assistance is needed.   Chipper Oman, MD

## 2017-03-14 NOTE — Progress Notes (Signed)
Paged MD regarding bp of 80s over 40s, patient awakens to voice and is still oriented to person place and situation, patient is drowsy. Dr. Estanislado Pandy ordered me to give a 250 cc fluid bolus and encourage PO fluids and continue to monitor.

## 2017-03-14 NOTE — Progress Notes (Signed)
PT Cancellation Note  Patient Details Name: Kelsey Hanna MRN: [id number] DOB: 1951-01-27   Cancelled Treatment:    Reason Eval/Treat Not Completed: Medical issues which prohibited therapy RN reports continued bedrest due to hematoma s/p sheath removal.  Recommended checking back tomorrow.   Thanks,   Barbarann Ehlers. [place], Walker, DPT [phone]   03/14/2017, 11:27 AM

## 2017-03-14 NOTE — Progress Notes (Signed)
Patient ID: Kelsey Hanna, female   DOB: [birth year], 67 y.o.   MRN: [id number] INR. Post procedure day 1. Patient awake devoid of any complaints of N/V changes in vision,motor weakness or changes in sensation. Denies any chest pains ,or SOB . Developed  Rt groin swelling with extensive ecchymosis.  Pressure held manually for 30 mins, with stoppage of IV heparin. Presently just finished breakfast . C/O of a H/A in the occipital region. VS BP 125/70s HR 80 SR PAO2 > 95 % RA. AAOx 3 .Speech and comprehension clear. Pupils RT 67mm reactive Lt barely reactive. EOMS full. No facial asymmetry Tongue midline. Motor . No drift. Power 5/5 all 4s  prox and distally. F to N RT = LT. RT groin soft with extensive bruising. Tender ++medially and at site of puncture. Distal pulses palpable DPs and dopplerable PTs..  Plan. 1D/C IV heparin. 2.P2 Y 12  Levels. 3.leave on aspirin 325 mg per day and plavix 75 mg per day. 4.U/S of the RT groin to R/O pseudoaneurysm 5.IM and ID following for medical and bacteremia issues.. D/W patient . S.Jakeira Seeman MD

## 2017-03-15 ENCOUNTER — Inpatient Hospital Stay (HOSPITAL_COMMUNITY): Payer: Medicare Other

## 2017-03-15 ENCOUNTER — Other Ambulatory Visit: Payer: Self-pay | Admitting: Family Medicine

## 2017-03-15 DIAGNOSIS — M549 Dorsalgia, unspecified: Secondary | ICD-10-CM

## 2017-03-15 DIAGNOSIS — R7881 Bacteremia: Secondary | ICD-10-CM

## 2017-03-15 DIAGNOSIS — T148XXA Other injury of unspecified body region, initial encounter: Secondary | ICD-10-CM

## 2017-03-15 DIAGNOSIS — G8929 Other chronic pain: Secondary | ICD-10-CM

## 2017-03-15 LAB — PLATELET INHIBITION P2Y12: Platelet Function  P2Y12: 113 [PRU] — ABNORMAL LOW (ref 194–418)

## 2017-03-15 LAB — CULTURE, BLOOD (ROUTINE X 2): Special Requests: ADEQUATE

## 2017-03-15 MED ORDER — ZOLPIDEM TARTRATE 5 MG PO TABS
5.0000 mg | ORAL_TABLET | Freq: Once | ORAL | Status: AC
  Administered 2017-03-15: 5 mg via ORAL
  Filled 2017-03-15: qty 1

## 2017-03-15 MED ORDER — SODIUM CHLORIDE 0.9 % IV BOLUS (SEPSIS)
250.0000 mL | Freq: Once | INTRAVENOUS | Status: AC
  Administered 2017-03-15: 250 mL via INTRAVENOUS

## 2017-03-15 NOTE — Progress Notes (Signed)
VASCULAR LAB PRELIMINARY  PRELIMINARY  PRELIMINARY  PRELIMINARY  Right groin ultrasound completed.    Preliminary report:  There is no obvious evidence of pseudoaneurysm or AV fistula in the right groin.  There is significant hematoma noted in the groin and throughout the proximal thigh.    Meagen Limones, RVT 03/15/2017, 11:09 AM

## 2017-03-15 NOTE — Plan of Care (Signed)
Patient is breathing freely on room air with good oxygen saturations.

## 2017-03-15 NOTE — Progress Notes (Signed)
Subjective:  No new complaints she does have chronic back pain  Antibiotics:  Anti-infectives (From admission, onward)   Start     Dose/Rate Route Frequency Ordered Stop   03/15/17 0000  vancomycin (VANCOCIN) IVPB 1000 mg/200 mL premix     1,000 mg 200 mL/hr over 60 Minutes Intravenous Every 12 hours 03/14/17 1124     03/14/17 1200  vancomycin (VANCOCIN) 2,000 mg in sodium chloride 0.9 % 500 mL IVPB     2,000 mg 250 mL/hr over 120 Minutes Intravenous  Once 03/14/17 1124 03/14/17 1402   03/14/17 0300  vancomycin (VANCOCIN) IVPB 1000 mg/200 mL premix  Status:  Discontinued     1,000 mg 200 mL/hr over 60 Minutes Intravenous Every 12 hours 03/13/17 1249 03/13/17 1802   03/13/17 2200  linezolid (ZYVOX) tablet 600 mg  Status:  Discontinued     600 mg Oral Every 12 hours 03/13/17 1751 03/14/17 1112   03/13/17 1500  vancomycin (VANCOCIN) 2,000 mg in sodium chloride 0.9 % 500 mL IVPB  Status:  Discontinued     2,000 mg 250 mL/hr over 120 Minutes Intravenous  Once 03/13/17 1418 03/13/17 1802   03/13/17 1300  vancomycin (VANCOCIN) 2,000 mg in sodium chloride 0.9 % 500 mL IVPB  Status:  Discontinued     2,000 mg 250 mL/hr over 120 Minutes Intravenous  Once 03/13/17 1231 03/13/17 1418   03/13/17 1021  ceFAZolin (ANCEF) 2-4 GM/100ML-% IVPB    Comments:  Kelsey Hanna   : cabinet override      03/13/17 1021 03/13/17 2229      Medications: Scheduled Meds: . aspirin  325 mg Oral Q breakfast  . atorvastatin  40 mg Oral QPM  . Chlorhexidine Gluconate Cloth  6 each Topical Q0600  . clonazePAM  0.5 mg Oral BID  . clopidogrel  75 mg Oral Q breakfast  . gabapentin  100 mg Oral BID  . levothyroxine  150 mcg Oral QAC breakfast  . linaclotide  145 mcg Oral QAC breakfast  . mupirocin ointment  1 application Nasal BID  . OLANZapine  20 mg Oral QHS   Continuous Infusions: . sodium chloride 90 mL/hr at 03/15/17 0050  . niCARDipine    . vancomycin 1,000 mg (03/15/17 0005)   PRN  Meds:.acetaminophen **OR** acetaminophen (TYLENOL) oral liquid 160 mg/5 mL **OR** acetaminophen, acetaminophen **OR** acetaminophen, docusate sodium, hydrALAZINE, ondansetron **OR** ondansetron (ZOFRAN) IV, oxyCODONE, tiZANidine    Objective: Weight change:   Intake/Output Summary (Last 24 hours) at 03/15/2017 1208 Last data filed at 03/15/2017 1000 Gross per 24 hour  Intake 3812.5 ml  Output 1690 ml  Net 2122.5 ml   Blood pressure (!) 110/58, pulse 82, temperature 98.4 F (36.9 C), temperature source Oral, resp. rate 19, height 5\' 2"  (1.575 m), weight 203 lb 0.7 oz (92.1 kg), SpO2 100 %. Temp:  [97.7 F (36.5 C)-99 F (37.2 C)] 98.4 F (36.9 C) (01/27 1100) Pulse Rate:  [72-114] 82 (01/27 1100) Resp:  [13-26] 19 (01/27 1100) BP: (72-145)/(38-113) 110/58 (01/27 1100) SpO2:  [92 %-100 %] 100 % (01/27 1100) Arterial Line BP: (58-151)/(53-99) 58/53 (01/26 2300)  Physical Exam: General: Alert and awake, oriented person and place not in any acute distress. HEENT: anicteric sclera, pupils reactive to light and accommodation, EOMI CVS regular rate, normal r,  no murmur rubs or gallops Chest: no wheezing, rales or rhonchi Abdomen: softnondistended Skin: bandage on groin, left upper extremity IV has some infiltration Neuro: nonfocal  CBC:  CBC Latest Ref Rng & Units 03/14/2017 03/13/2017 03/12/2017  WBC 4.0 - 10.5 K/uL 13.4(H) 7.9 8.6  Hemoglobin 12.0 - 15.0 g/dL 10.2(L) 11.8(L) 11.3(L)  Hematocrit 36.0 - 46.0 % 30.5(L) 36.8 35.6(L)  Platelets 150 - 400 K/uL 237 218 206      BMET Recent Labs    03/13/17 0603 03/14/17 0423  NA 141 140  K 4.4 4.1  CL 111 109  CO2 22 21*  GLUCOSE 99 118*  BUN 12 7  CREATININE 0.75 0.70  CALCIUM 8.6* 8.6*     Liver Panel  Recent Labs    03/12/17 1400  PROT 5.1*  ALBUMIN 3.0*  AST 18  ALT 17  ALKPHOS 55  BILITOT 0.4       Sedimentation Rate No results for input(s): ESRSEDRATE in the last 72 hours. C-Reactive Protein No  results for input(s): CRP in the last 72 hours.  Micro Results: Recent Results (from the past 720 hour(s))  Blood culture (routine x 2)     Status: Abnormal   Collection Time: 03/12/17  2:35 PM  Result Value Ref Range Status   Specimen Description BLOOD LEFT ARM  Final   Special Requests   Final    BOTTLES DRAWN AEROBIC AND ANAEROBIC Blood Culture adequate volume   Culture  Setup Time   Final    GRAM POSITIVE COCCI IN CLUSTERS ANAEROBIC BOTTLE ONLY CRITICAL RESULT CALLED TO, READ BACK BY AND VERIFIED WITH: Kelsey Hanna Pharm.D. 12:25 03/13/17 ([email or web address])    Culture METHICILLIN RESISTANT STAPHYLOCOCCUS AUREUS (A)  Final   Report Status 03/15/2017 FINAL  Final   Organism ID, Bacteria METHICILLIN RESISTANT STAPHYLOCOCCUS AUREUS  Final      Susceptibility   Methicillin resistant staphylococcus aureus - MIC*    CIPROFLOXACIN >=8 RESISTANT Resistant     ERYTHROMYCIN >=8 RESISTANT Resistant     GENTAMICIN <=0.5 SENSITIVE Sensitive     OXACILLIN RESISTANT Resistant     TETRACYCLINE <=1 SENSITIVE Sensitive     VANCOMYCIN 1 SENSITIVE Sensitive     TRIMETH/SULFA <=10 SENSITIVE Sensitive     CLINDAMYCIN >=8 RESISTANT Resistant     RIFAMPIN <=0.5 SENSITIVE Sensitive     Inducible Clindamycin NEGATIVE Sensitive     * METHICILLIN RESISTANT STAPHYLOCOCCUS AUREUS  Blood Culture ID Panel (Reflexed)     Status: Abnormal   Collection Time: 03/12/17  2:35 PM  Result Value Ref Range Status   Enterococcus species NOT DETECTED NOT DETECTED Final   Listeria monocytogenes NOT DETECTED NOT DETECTED Final   Staphylococcus species DETECTED (A) NOT DETECTED Final    Comment: CRITICAL RESULT CALLED TO, READ BACK BY AND VERIFIED WITH: Kelsey Hanna Pharm.D. 12:25 03/13/17 ([email or web address])    Staphylococcus aureus DETECTED (A) NOT DETECTED Final    Comment: Methicillin (oxacillin)-resistant Staphylococcus aureus (MRSA). MRSA is predictably resistant to beta-lactam antibiotics (except ceftaroline). Preferred therapy is  vancomycin unless clinically contraindicated. Patient requires contact precautions if  hospitalized. CRITICAL RESULT CALLED TO, READ BACK BY AND VERIFIED WITH: Kelsey Hanna Pharm.D. 12:25 03/13/17 ([email or web address])    Methicillin resistance DETECTED (A) NOT DETECTED Final    Comment: CRITICAL RESULT CALLED TO, READ BACK BY AND VERIFIED WITH: Kelsey Hanna Pharm.D. 12:25 03/13/17 ([email or web address])    Streptococcus species NOT DETECTED NOT DETECTED Final   Streptococcus agalactiae NOT DETECTED NOT DETECTED Final   Streptococcus pneumoniae NOT DETECTED NOT DETECTED Final   Streptococcus pyogenes NOT DETECTED NOT DETECTED Final   Acinetobacter baumannii NOT DETECTED NOT DETECTED Final   Enterobacteriaceae species  NOT DETECTED NOT DETECTED Final   Enterobacter cloacae complex NOT DETECTED NOT DETECTED Final   Escherichia coli NOT DETECTED NOT DETECTED Final   Klebsiella oxytoca NOT DETECTED NOT DETECTED Final   Klebsiella pneumoniae NOT DETECTED NOT DETECTED Final   Proteus species NOT DETECTED NOT DETECTED Final   Serratia marcescens NOT DETECTED NOT DETECTED Final   Haemophilus influenzae NOT DETECTED NOT DETECTED Final   Neisseria meningitidis NOT DETECTED NOT DETECTED Final   Pseudomonas aeruginosa NOT DETECTED NOT DETECTED Final   Candida albicans NOT DETECTED NOT DETECTED Final   Candida glabrata NOT DETECTED NOT DETECTED Final   Candida krusei NOT DETECTED NOT DETECTED Final   Candida parapsilosis NOT DETECTED NOT DETECTED Final   Candida tropicalis NOT DETECTED NOT DETECTED Final  Blood culture (routine x 2)     Status: None (Preliminary result)   Collection Time: 03/12/17  2:45 PM  Result Value Ref Range Status   Specimen Description BLOOD RIGHT HAND  Final   Special Requests   Final    BOTTLES DRAWN AEROBIC AND ANAEROBIC Blood Culture adequate volume   Culture NO GROWTH 2 DAYS  Final   Report Status PENDING  Incomplete  MRSA PCR Screening     Status: Abnormal   Collection Time: 03/13/17   4:29 PM  Result Value Ref Range Status   MRSA by PCR POSITIVE (A) NEGATIVE Final    Comment:        The GeneXpert MRSA Assay (FDA approved for NASAL specimens only), is one component of a comprehensive MRSA colonization surveillance program. It is not intended to diagnose MRSA infection nor to guide or monitor treatment for MRSA infections. CRITICAL RESULT CALLED TO, READ BACK BY AND VERIFIED WITH: Melburn Hake, RN AT 2015 ON 03/13/17 BY C. JESSUP, MLT.     Studies/Results: No results found.    Assessment/Plan:  INTERVAL HISTORY: She was placed back on vancomycin and we have had blood cultures drawn her IV however is become infiltrated   Principal Problem:   Hypotension Active Problems:   Bipolar I disorder (HCC)   Essential hypertension   Hypothyroidism   HLD (hyperlipidemia)   Chronic back pain   OSA (obstructive sleep apnea)   Mitral regurgitation   Chronic diastolic CHF (congestive heart failure) ([place])   Intracranial aneurysm   Slurred speech   Brain aneurysm   Internal carotid aneurysm    Kelsey Hanna is a 67 y.o. female with HTN, HLD, oSA, CHV, DM admitted and found to have aneurysms sp coling of right ICA aneurysm. One of two blood cultures  + MRSA = pathogen here  #1. MRSA bacteremia: overnight could only use IV for heparin so changed to zyvox po       [place] Antimicrobial Management Team Staphylococcus aureus bacteremia   Staphylococcus aureus bacteremia (SAB) is associated with a high rate of complications and mortality.  Specific aspects of clinical management are critical to optimizing the outcome of patients with SAB.  Therefore, the [facility] Antimicrobial Management Team [facility]) has initiated an intervention aimed at improving the management of SAB at [facility].  To do so, Infectious Diseases physicians are providing an evidence-based consult for the management of all patients with SAB.     Yes No Comments  Perform follow-up blood  cultures (even if the patient is afebrile) to ensure clearance of bacteremia [x]  []  Blood cultures taken yesterday  Remove vascular catheter and obtain follow-up blood cultures after the removal of the catheter []  []   DO NOT PLACE PICC OR CENTRAL LINE UNTIL WE have proven that repeat blood cultures are NO GROWTH at 72 hours minimum  IF we lose ability to use her PIV would go back to PO ZYVOX  Perform echocardiography to evaluate for endocarditis (transthoracic ECHO is 40-50% sensitive, TEE is > 90% sensitive) [x]  []  Please keep in mind, that neither test can definitively EXCLUDE endocarditis, and that should clinical suspicion remain high for endocarditis the patient should then still be treated with an "endocarditis" duration of therapy = 6 weeks  She needs TEE if possible  Consult electrophysiologist to evaluate implanted cardiac device (pacemaker, ICD) []  []    Ensure source control [x]  []  Have all abscesses been drained effectively? Have deep seeded infections (septic joints or osteomyelitis) had appropriate surgical debridement?  Investigate for "metastatic" sites of infection [x]  []  Does the patient have ANY symptom or physical exam finding that would suggest a deeper infection (back or neck pain that may be suggestive of vertebral osteomyelitis or epidural abscess, muscle pain that could be a symptom of pyomyositis)?  Keep in mind that for deep seeded infections MRI imaging with contrast is preferred rather than other often insensitive tests such as plain x-rays, especially early in a patient's presentation.  She may need MRI L spine but would re-evaluate her when she is not so bed bound  Change antibiotic therapy to vancomycin if she can have it now with IV , otherwise can go to zyvox again []  []  Beta-lactam antibiotics are preferred for MSSA due to higher cure rates.   If on Vancomycin, goal trough should be 15 - 20 mcg/mL  Estimated duration of IV antibiotic therapy:  4-6 weeks since I do  not think this is likely to be categorized as "uncomplicated SAB. Ie no clear removable focus. Though we can see if we can clear cultures within 48 hrs and exclude endocarditis with TEE []  []  Consult case management for probably prolonged outpatient IV antibiotic therapy    Dr. Linus Salmons is back tomorrow.   LOS: 2 days   Alcide Evener 03/15/2017, 12:08 PM

## 2017-03-15 NOTE — Progress Notes (Signed)
Patient ID: Kelsey Hanna, female   DOB: [birth year], 67 y.o.   MRN: [id number] INR. Post procedure day 2. Patient awake. No new complaints of N/V changes in vision,motor weakness or changes in sensation. Denies any chest pains ,or SOB . States right groin feels a little better, [place] not performed yet to assess for pesudoaneurysm  BP (!) 141/66 (BP Location: Right Arm)   Pulse 72   Temp 97.7 F (36.5 C) (Axillary)   Resp (!) 24   Ht 5\' 2"  (1.575 m)   Wt 203 lb 0.7 oz (92.1 kg)   SpO2 100%   BMI 37.14 kg/m   AAOx 3 .Speech and comprehension clear. Pupils RT 16mm reactive Lt barely reactive. EOMS full. No facial asymmetry Tongue midline. Motor . No drift. Power 5/5 all 4s  prox and distally. F to N RT = LT. RT groin soft with extensive bruising. Tender ++medially and at site of puncture. No palpable/pulsatile mass Distal pulses palpable DPs and dopplerable PTs..  Plan. 1. Still needs [place] 2.Need to check P2Y12 3.leave on aspirin 325 mg per day and plavix 75 mg per day. 4.Repeat Blood Cx pending. Appreciate ID help OK from NIR standpoint to transfer out of Neuro ICU to medical service.   Ascencion Dike PA-C Interventional Radiology 03/15/2017 9:22 AM

## 2017-03-15 NOTE — Evaluation (Signed)
Physical Therapy Evaluation Patient Details Name: Kelsey Hanna MRN: [id number] DOB: [birth year] Today's Date: 03/15/2017   History of Present Illness  67 y.o. female admitted on 03/12/17 for slurred speech and difficulty walking.  She was found to have hypotension, bil ICA aneurysms and IR consulted. 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  Pt with significant PMH of DM, CA, bipolar d/o, and lumbar laminectomy and decompression.  Clinical Impression  Pt was able to get OOB to chair once cleared by [place] of her right groin.  She was limited more by dizziness with mobility than by pain in her right groin.  Due to dizziness, we limited our gait to short distance in the room to the chair with RW.  I anticipate she will progress well enough to d/c home with the assist of her aide.   PT to follow acutely for deficits listed below.       Follow Up Recommendations Home health PT;Supervision - Intermittent    Equipment Recommendations  None recommended by PT    Recommendations for Other Services   NA    Precautions / Restrictions Precautions Precautions: Fall Precaution Comments: poor vision at baseline, legally blind      Mobility  Bed Mobility Overal bed mobility: Needs Assistance Bed Mobility: Supine to Sit     Supine to sit: [facility] elevated;Min assist     General bed mobility comments: Min assist mostly to help progress right leg to EOB.   Transfers Overall transfer level: Needs assistance Equipment used: Rolling walker (2 wheeled) Transfers: Sit to/from Stand Sit to Stand: Min assist         General transfer comment: Min assist to support trunk for safety as she came to her feet for the first time.  Pt reporting dizziness and blurred vision with movement.    Ambulation/Gait Ambulation/Gait assistance: Min assist Ambulation Distance (Feet): 5 Feet Assistive device: Rolling walker (2 wheeled) Gait  Pattern/deviations: Step-to pattern;Shuffle     General Gait Details: Pt able to take pivotal steps around to the chair with very light use of RW.  Min assist for balance and safety and encouragement to turn slowly since she is dizzy.  She has not been upright in a few days which may be a factor as well.          Balance Overall balance assessment: Needs assistance Sitting-balance support: Feet supported;Bilateral upper extremity supported Sitting balance-Leahy Scale: Fair     Standing balance support: Bilateral upper extremity supported Standing balance-Leahy Scale: Fair                               Pertinent Vitals/Pain Pain Assessment: Faces Faces Pain Scale: Hurts little more Pain Location: right groin Pain Descriptors / Indicators: Aching;Burning Pain Intervention(s): Limited activity within patient's tolerance;Monitored during session;Repositioned    Home Living Family/patient expects to be discharged to:: Private residence Living Arrangements: Alone Available Help at Discharge: Personal care attendant;Other (Comment)(aide 2 hours per day 5 days per week) Type of Home: House Home Access: Level entry     Home Layout: One level Home Equipment: Walker - 2 wheels;Cane - single point Additional Comments: Daughter lives two blocks away    Prior Function Level of Independence: Independent         Comments: Per pt report, she takes tub baths, walks independently, daughter drives her where she needs to go and aide cleans and  cooks.      Hand Dominance   Dominant Hand: Right    Extremity/Trunk Assessment   Upper Extremity Assessment Upper Extremity Assessment: Overall WFL for tasks assessed    Lower Extremity Assessment Lower Extremity Assessment: RLE deficits/detail RLE Deficits / Details: right hip strength limited by painful right groin site due to post IR sheath hematoma.  [place] negative for aneurysm/pseudo aneurysm here, so able to proceed with  mobility.     Cervical / Trunk Assessment Cervical / Trunk Assessment: Other exceptions Cervical / Trunk Exceptions: h/o low back surgery  Communication   Communication: No difficulties  Cognition Arousal/Alertness: Awake/alert Behavior During Therapy: WFL for tasks assessed/performed Overall Cognitive Status: Within Functional Limits for tasks assessed(not specifically tested, but conversation normal)                                               Assessment/Plan    PT Assessment Patient needs continued PT services  PT Problem List Decreased strength;Decreased activity tolerance;Decreased balance;Decreased mobility;Decreased knowledge of use of DME;Obesity;Pain           PT Goals (Current goals can be found in the Care Plan section)  Acute Rehab PT Goals Patient Stated Goal: to get back to her normal level of function PT Goal Formulation: With patient Time For Goal Achievement: 03/29/17 Potential to Achieve Goals: Good    Frequency Min 3X/week    AM-PAC PT "6 Clicks" Daily Activity  Outcome Measure Difficulty turning over in bed (including adjusting bedclothes, sheets and blankets)?: Unable Difficulty moving from lying on back to sitting on the side of the bed? : Unable Difficulty sitting down on and standing up from a chair with arms (e.g., wheelchair, bedside commode, etc,.)?: Unable Help needed moving to and from a bed to chair (including a wheelchair)?: A Little Help needed walking in hospital room?: A Little Help needed climbing 3-5 steps with a railing? : A Little 6 Click Score: 12    End of Session   Activity Tolerance: Patient limited by pain;Other (comment)(limited by reports of dizziness with mobility) Patient left: in chair;with call bell/phone within reach Nurse Communication: Mobility status;Other (comment)(no more chair alarm pads on the unit or the unit next door) PT Visit Diagnosis: Unsteadiness on feet (R26.81);Difficulty in  walking, not elsewhere classified (R26.2);Pain Pain - Right/Left: Right Pain - part of body: Hip    Time: 6948-5462 PT Time Calculation (min) (ACUTE ONLY): 17 min   Charges:       Wells Guiles B. Keldon Lassen, PT, DPT [phone]   PT Evaluation $PT Eval Moderate Complexity: 1 Mod     03/15/2017, 5:35 PM

## 2017-03-16 ENCOUNTER — Other Ambulatory Visit: Payer: Self-pay

## 2017-03-16 ENCOUNTER — Encounter (HOSPITAL_COMMUNITY): Payer: Self-pay | Admitting: Interventional Radiology

## 2017-03-16 DIAGNOSIS — E669 Obesity, unspecified: Secondary | ICD-10-CM

## 2017-03-16 MED ORDER — KETOROLAC TROMETHAMINE 15 MG/ML IJ SOLN
15.0000 mg | Freq: Three times a day (TID) | INTRAMUSCULAR | Status: DC | PRN
Start: 2017-03-16 — End: 2017-03-19

## 2017-03-16 MED ORDER — ZOLPIDEM TARTRATE 5 MG PO TABS
5.0000 mg | ORAL_TABLET | Freq: Once | ORAL | Status: AC
  Administered 2017-03-16: 5 mg via ORAL
  Filled 2017-03-16: qty 1

## 2017-03-16 NOTE — Progress Notes (Signed)
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  Procedure 1.25: S/P bil;ateral common carotid arteriograms,followed by  endovascular treatment of Rt para ophthalmic aneurysm using x 2 pipeline flow diverters. Lt superior hypophyseal aneurysm 5.65mm x 61mm x  54mm aneurysm.  Right groin after procedure with hematoma and large ecchymotic are surrounding sheath site Tender US performed yesterday No reading yet--- ? Pseudoaneurysm  Pt states she has ambulated in room and in hall Only complaint is pain at Rt groin site Moving all 4s Eating well No headache Denies vision or speech changes  Allergies: Patient has no known allergies.  Medications: Prior to Admission medications   Medication Sig Start Date End Date Taking? Authorizing Provider  aspirin 325 MG tablet Take 1 tablet (325 mg total) by mouth daily. 01/24/17  Yes Aline August, MD  atorvastatin (LIPITOR) 40 MG tablet Take 1 tablet (40 mg total) by mouth every evening. 03/04/17  Yes [name], FNP  clonazePAM (KLONOPIN) 0.5 MG tablet Take 0.5 mg by mouth 2 (two) times daily. 1 in the morning and 1 midday   Yes [provider]  clopidogrel (PLAVIX) 75 MG tablet Take 75 mg by mouth daily.   Yes [provider]  diphenhydrAMINE (BENADRYL) 25 MG tablet Take 25 mg by mouth daily.   Yes [provider]  DOCQLACE 100 MG capsule TAKE 1 TO 2 CAPSULES BY MOUTH TWICE A DAY AS NEEDED FOR MILD CONSTIPATION 11/11/16  Yes Golden Circle, FNP  gabapentin (NEURONTIN) 100 MG capsule Take 100 mg by mouth 3  (three) times daily.   Yes [provider]  levothyroxine (SYNTHROID, LEVOTHROID) 150 MCG tablet TAKE 1 TABLET BY MOUTH DAILY BEFORE BREAKFAST 03/06/17  Yes [name], FNP  linaclotide [facility]) 145 MCG CAPS capsule Take 1 capsule (145 mcg total) by mouth daily before breakfast. 03/04/17  Yes [name], FNP  OLANZapine (ZYPREXA) 20 MG tablet Take 20 mg by mouth at bedtime.  02/11/17  Yes [provider]  Oxycodone HCl 10 MG TABS Take 10 mg by mouth every 6 (six) hours as needed (pain).   Yes [provider]  tiZANidine (ZANAFLEX) 4 MG tablet Take 4 mg by mouth every 8 (eight) hours as needed for muscle spasms.   Yes [provider]  zolpidem (AMBIEN) 5 MG tablet Take 1 tablet (5 mg total) by mouth at bedtime as needed for sleep. 03/06/17  Yes [name], FNP     Vital Signs: BP (!) 115/55 (BP Location: Right Arm)   Pulse 79   Temp 98.6 F (37 C) (Oral)   Resp 20   Ht 5\' 3"  (1.6 m)   Wt 215 lb 13.3 oz (97.9 kg)   SpO2 95%   BMI 38.23 kg/m   Physical Exam  Constitutional: She is oriented to person, place, and time.  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 Bowel sounds are normal.  Musculoskeletal: Normal range of motion.  Moving all 4s Good strength B Good use of all extremities Rt leg slower--- always  Neurological: She is alert and oriented to person, place, and time.  Skin: Skin is warm and dry.  Right groin site has large ecchymotic area Covers most of groin and lateal leg- down to mid thigh Tender to touch Sensation intact Hematoma is minimal around site - approx 3 x 3 cm Rt foot 2+ pulses   Nursing note and vitals reviewed.   Imaging: Ct Angio Head W Or Wo Contrast  Result Date: 03/12/2017 CLINICAL DATA:  67 year old female with abnormal speech and bilateral weakness. Bilateral ICA paraophthalmic aneurysms. EXAM: CT ANGIOGRAPHY HEAD AND NECK CT PERFUSION BRAIN  TECHNIQUE: Multidetector CT imaging of the head and neck was performed using the standard protocol during bolus administration of intravenous contrast. Multiplanar CT image reconstructions and MIPs were obtained to evaluate the vascular anatomy. Carotid stenosis measurements (when applicable) are obtained utilizing NASCET criteria, using the distal internal carotid diameter as the denominator. Multiphase CT imaging of the brain was performed following IV bolus contrast injection. Subsequent parametric perfusion maps were calculated using RAPID software. CONTRAST:  100 milliliters Isovue 370 COMPARISON:  Head CT without contrast 1338 hr today. FINDINGS: CT Brain Perfusion Findings: CBF (<30%) Volume: 0 milliliters Perfusion (Tmax>6.0s) volume: 0 milliliters Mismatch Volume: Not applicablemL Infarction Location:Not applicable CTA NECK Skeleton: Absent dentition. Cervical spine degeneration. Stable visualized osseous structures. Upper chest: Patchy and dependent pulmonary opacity in the upper lungs is similar to that in December and may reflect a combination of chronic lung disease and atelectasis. No superior mediastinal lymphadenopathy. Other neck: Negative aside from a partially retropharyngeal course of both carotid arteries. No cervical lymphadenopathy. Aortic arch: 3 vessel arch configuration. Stable mild to moderate arch and great vessel origin calcified plaque. Right carotid system: Mildly increased soft plaque in the ventral brachiocephalic artery since December, or might have been obscured by artifact previously. No significant brachiocephalic artery stenosis. No right CCA origin stenosis. Tortuous proximal right CCA. Mild plaque in the right CCA and at the right carotid bifurcation without stenosis. Partially retropharyngeal but otherwise negative cervical right ICA. Left carotid system: Stable calcified plaque at the left CCA origin without stenosis. Intermittent mild plaque in the left CCA. Minimal  calcified plaque at the left ICA origin and bulb without stenosis. Mildly tortuous cervical left ICA with a partially retropharyngeal course. Vertebral arteries: Stable tortuosity of the proximal right subclavian artery with a kinked appearance before the right vertebral artery origin. Stable calcified plaque at the right vertebral artery origin with mild stenosis. The right vertebral artery is patent to the skull base without additional stenosis. No proximal left subclavian artery stenosis despite some soft plaque. Normal left vertebral artery origin. Tortuous left V1 segment with a mildly kinked appearance. Patent left vertebral artery to the skull base without additional stenosis. CTA HEAD Posterior circulation: Codominant distal vertebral arteries. Patent left PICA origin. The right AICA appears dominant. Patent vertebrobasilar junction. Patent basilar artery without stenosis. Normal SCA and left PCA origins. Fetal type right PCA origin. Bilateral PCA branches are within normal limits. Anterior circulation: Both ICA siphons are patent. Mild siphon plaque. No siphon stenosis. 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 This appears to arise from the undersurface of the left supraclinoid segment (paraophthalmic). 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 This aneurysm might incorporate the origin of the right ophthalmic artery, uncertain. Carotid termini are  stable and patent. Dominant left ACA A1 segment, the right A1 is diminutive. MCA and ACA origins are normal. Anterior communicating artery and bilateral ACA branches are within normal limits. Left MCA M1 segment, left MCA bifurcation, and left MCA branches are stable and within normal limits. Right MCA M1 segment, bifurcation, and right MCA  branches are stable and within normal limits. Venous sinuses: Patent. Anatomic variants: Dominant left and diminutive right ACA A1 segments. Fetal type right PCA origin. Review of the MIP images confirms the above findings IMPRESSION: 1. Negative for emergent large vessel occlusion. No core infarct or penumbra detected by CTP. Results of #1 were communicated to Dr. Rory Percy at 1411 hr on 1/24/2019by text page via the [facility] messaging system. 2. 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 3. Up to moderate arch and great vessel origin atherosclerosis, but otherwise mild for age atherosclerosis in the head and neck. No significant arterial stenosis identified. 4. Nonspecific patchy opacity in both upper lungs appears similar to that on 01/24/17, perhaps a combination of atelectasis and chronic lung disease. Electronically Signed   By: Genevie Ann M.D.   On: 03/12/2017 14:24   Ct Angio Neck W Or Wo Contrast  Result Date: 03/12/2017 CLINICAL DATA:  67 year old female with abnormal speech and bilateral weakness. Bilateral ICA paraophthalmic aneurysms. EXAM: CT ANGIOGRAPHY HEAD AND NECK CT PERFUSION BRAIN TECHNIQUE: Multidetector CT imaging of the head and neck was performed using the standard protocol during bolus administration of intravenous contrast. Multiplanar CT image reconstructions and MIPs were obtained to evaluate the vascular anatomy. Carotid stenosis measurements (when applicable) are obtained utilizing NASCET criteria, using the distal internal carotid diameter as the denominator. Multiphase CT imaging of the brain was performed following IV bolus contrast injection. Subsequent parametric perfusion maps were calculated using RAPID software. CONTRAST:  100 milliliters Isovue 370 COMPARISON:  Head CT without contrast 1338 hr today. FINDINGS: CT Brain Perfusion Findings: CBF  (<30%) Volume: 0 milliliters Perfusion (Tmax>6.0s) volume: 0 milliliters Mismatch Volume: Not applicablemL Infarction Location:Not applicable CTA NECK Skeleton: Absent dentition. Cervical spine degeneration. Stable visualized osseous structures. Upper chest: Patchy and dependent pulmonary opacity in the upper lungs is similar to that in December and may reflect a combination of chronic lung disease and atelectasis. No superior mediastinal lymphadenopathy. Other neck: Negative aside from a partially retropharyngeal course of both carotid arteries. No cervical lymphadenopathy. Aortic arch: 3 vessel arch configuration. Stable mild to moderate arch and great vessel origin calcified plaque. Right carotid system: Mildly increased soft plaque in the ventral brachiocephalic artery since December, or might have been obscured by artifact previously. No significant brachiocephalic artery stenosis. No right CCA origin stenosis. Tortuous proximal right CCA. Mild plaque in the right CCA and at the right carotid bifurcation without stenosis. Partially retropharyngeal but otherwise negative cervical right ICA. Left carotid system: Stable calcified plaque at the left CCA origin without stenosis. Intermittent mild plaque in the left CCA. Minimal calcified plaque at the left ICA origin and bulb without stenosis. Mildly tortuous cervical left ICA with a partially retropharyngeal course. Vertebral arteries: Stable tortuosity of the proximal right subclavian artery with a kinked appearance before the right vertebral artery origin. Stable calcified plaque at the right vertebral artery origin with mild stenosis. The right vertebral artery is patent to the skull base without additional stenosis. No proximal left subclavian artery stenosis despite some soft plaque. Normal left  vertebral artery origin. Tortuous left V1 segment with a mildly kinked appearance. Patent left vertebral artery to the skull base without additional stenosis. CTA HEAD  Posterior circulation: Codominant distal vertebral arteries. Patent left PICA origin. The right AICA appears dominant. Patent vertebrobasilar junction. Patent basilar artery without stenosis. Normal SCA and left PCA origins. Fetal type right PCA origin. Bilateral PCA branches are within normal limits. Anterior circulation: Both ICA siphons are patent. Mild siphon plaque. No siphon stenosis. 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 This appears to arise from the undersurface of the left supraclinoid segment (paraophthalmic). 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 This aneurysm might incorporate the origin of the right ophthalmic artery, uncertain. Carotid termini are stable and patent. Dominant left ACA A1 segment, the right A1 is diminutive. MCA and ACA origins are normal. Anterior communicating artery and bilateral ACA branches are within normal limits. Left MCA M1 segment, left MCA bifurcation, and left MCA branches are stable and within normal limits. Right MCA M1 segment, bifurcation, and right MCA branches are stable and within normal limits. Venous sinuses: Patent. Anatomic variants: Dominant left and diminutive right ACA A1 segments. Fetal type right PCA origin. Review of the MIP images confirms the above findings IMPRESSION: 1. Negative for emergent large vessel occlusion. No core infarct or penumbra detected by CTP. Results of #1 were communicated to Dr. Rory Percy at 1411 hr on 1/24/2019by text page via the [facility] messaging system. 2. 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 3. Up to moderate arch and great vessel origin atherosclerosis, but  otherwise mild for age atherosclerosis in the head and neck. No significant arterial stenosis identified. 4. Nonspecific patchy opacity in both upper lungs appears similar to that on 01/24/17, perhaps a combination of atelectasis and chronic lung disease. Electronically Signed   By: Genevie Ann M.D.   On: 03/12/2017 14:24   Ct Cerebral Perfusion W Contrast  Result Date: 03/12/2017 CLINICAL DATA:  67 year old female with abnormal speech and bilateral weakness. Bilateral ICA paraophthalmic aneurysms. EXAM: CT ANGIOGRAPHY HEAD AND NECK CT PERFUSION BRAIN TECHNIQUE: Multidetector CT imaging of the head and neck was performed using the standard protocol during bolus administration of intravenous contrast. Multiplanar CT image reconstructions and MIPs were obtained to evaluate the vascular anatomy. Carotid stenosis measurements (when applicable) are obtained utilizing NASCET criteria, using the distal internal carotid diameter as the denominator. Multiphase CT imaging of the brain was performed following IV bolus contrast injection. Subsequent parametric perfusion maps were calculated using RAPID software. CONTRAST:  100 milliliters Isovue 370 COMPARISON:  Head CT without contrast 1338 hr today. FINDINGS: CT Brain Perfusion Findings: CBF (<30%) Volume: 0 milliliters Perfusion (Tmax>6.0s) volume: 0 milliliters Mismatch Volume: Not applicablemL Infarction Location:Not applicable CTA NECK Skeleton: Absent dentition. Cervical spine degeneration. Stable visualized osseous structures. Upper chest: Patchy and dependent pulmonary opacity in the upper lungs is similar to that in December and may reflect a combination of chronic lung disease and atelectasis. No superior mediastinal lymphadenopathy. Other neck: Negative aside from a partially retropharyngeal course of both carotid arteries. No cervical lymphadenopathy. Aortic arch: 3 vessel arch configuration. Stable mild to moderate arch and great vessel  origin calcified plaque.  Right carotid system: Mildly increased soft plaque in the ventral brachiocephalic artery since December, or might have been obscured by artifact previously. No significant brachiocephalic artery stenosis. No right CCA origin stenosis. Tortuous proximal right CCA. Mild plaque in the right CCA and at the right carotid bifurcation without stenosis. Partially retropharyngeal but otherwise negative cervical right ICA. Left carotid system: Stable calcified plaque at the left CCA origin without stenosis. Intermittent mild plaque in the left CCA. Minimal calcified plaque at the left ICA origin and bulb without stenosis. Mildly tortuous cervical left ICA with a partially retropharyngeal course. Vertebral arteries: Stable tortuosity of the proximal right subclavian artery with a kinked appearance before the right vertebral artery origin. Stable calcified plaque at the right vertebral artery origin with mild stenosis. The right vertebral artery is patent to the skull base without additional stenosis. No proximal left subclavian artery stenosis despite some soft plaque. Normal left vertebral artery origin. Tortuous left V1 segment with a mildly kinked appearance. Patent left vertebral artery to the skull base without additional stenosis. CTA HEAD Posterior circulation: Codominant distal vertebral arteries. Patent left PICA origin. The right AICA appears dominant. Patent vertebrobasilar junction. Patent basilar artery without stenosis. Normal SCA and left PCA origins. Fetal type right PCA origin. Bilateral PCA branches are within normal limits. Anterior circulation: Both ICA siphons are patent. Mild siphon plaque. No siphon stenosis. 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 This appears to arise from the undersurface of the left supraclinoid segment (paraophthalmic). A more lobulated and complex 8-9 millimeter saccular aneurysm of the right ICA  anterior genu is best seen on series 8, image 119 and series 9, image 78 and is stable from the MRA. This aneurysm might incorporate the origin of the right ophthalmic artery, uncertain. Carotid termini are stable and patent. Dominant left ACA A1 segment, the right A1 is diminutive. MCA and ACA origins are normal. Anterior communicating artery and bilateral ACA branches are within normal limits. Left MCA M1 segment, left MCA bifurcation, and left MCA branches are stable and within normal limits. Right MCA M1 segment, bifurcation, and right MCA branches are stable and within normal limits. Venous sinuses: Patent. Anatomic variants: Dominant left and diminutive right ACA A1 segments. Fetal type right PCA origin. Review of the MIP images confirms the above findings IMPRESSION: 1. Negative for emergent large vessel occlusion. No core infarct or penumbra detected by CTP. Results of #1 were communicated to Dr. Rory Percy at 1411 hr on 1/24/2019by text page via the [facility] messaging system. 2. 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 3. Up to moderate arch and great vessel origin atherosclerosis, but otherwise mild for age atherosclerosis in the head and neck. No significant arterial stenosis identified. 4. Nonspecific patchy opacity in both upper lungs appears similar to that on 01/24/17, perhaps a combination of atelectasis and chronic lung disease. Electronically Signed   By: Genevie Ann M.D.   On: 03/12/2017 14:24   Dg Chest Port 1 View  Result Date: 03/12/2017 CLINICAL DATA:  Hypotension EXAM: PORTABLE CHEST 1 VIEW COMPARISON:  06/08/2015 FINDINGS: Cardiac shadow is at the upper limits of normal in size but stable. The lungs are well aerated bilaterally. Elevation of left hemidiaphragm is again noted. No focal infiltrate or sizable effusion is seen. No bony abnormality  is noted.  IMPRESSION: No acute abnormality noted. Electronically Signed   By: Inez Catalina M.D.   On: 03/12/2017 14:36    Labs:  CBC: Recent Labs    01/23/17 2236  03/12/17 1324 03/12/17 1400 03/13/17 0603 03/14/17 0423  WBC 8.7  --   --  8.6 7.9 13.4*  HGB 12.8   < > 13.6 11.3* 11.8* 10.2*  HCT 40.6   < > 40.0 35.6* 36.8 30.5*  PLT 230  --   --  206 218 237   < > = values in this interval not displayed.    COAGS: Recent Labs    01/23/17 2236 03/12/17 1400  INR 1.01 1.10  APTT 30 30    BMP: Recent Labs    01/23/17 2236  03/04/17 1112 03/12/17 1324 03/12/17 1400 03/13/17 0603 03/14/17 0423  NA 141   < > 140 140 136 141 140  K 4.1   < > 4.3 4.4 4.1 4.4 4.1  CL 104   < > 105 103 103 111 109  CO2 27  --  26  --  25 22 21*  GLUCOSE 123*   < > 87 100* 92 99 118*  BUN 10   < > 12 13 13 12 7   CALCIUM 9.5  --  9.5  --  8.3* 8.6* 8.6*  CREATININE 0.94   < > 0.82 0.90 0.93 0.75 0.70  GFRNONAA >60  --   --   --  >60 >60 >60  GFRAA >60  --   --   --  >60 >60 >60   < > = values in this interval not displayed.    LIVER FUNCTION TESTS: Recent Labs    01/23/17 2236 03/04/17 1112 03/12/17 1400  BILITOT 0.5 0.4 0.4  AST 27 19 18   ALT 36 22 17  ALKPHOS 75 71 55  PROT 6.5 7.3 5.1*  ALBUMIN 3.8 4.4 3.0*    Assessment and Plan:  R ICA aneurysm embolization 1/25 in IR Post procedure Rt groin hematoma--- ecchymosis Tender to touch [place] pending result Will report to Dr Estanislado Pandy Follow [facility]---- NGTD 1/26   Electronically Signed: Monia Sabal A, PA-C 03/16/2017, 1:54 PM   I spent a total of 15 Minutes at the the patient's bedside AND on the patient's hospital floor or unit, greater than 50% of which was counseling/coordinating care for R ICA aneurysm embolization

## 2017-03-16 NOTE — Care Management Note (Signed)
Case Management Note  Patient Details  Name: Kelsey Hanna MRN: [id number] Date of Birth: 09/16/1950  Subjective/Objective:    Pt in with hypotension and is s/p aneurysm coiling. She is from home alone. She states she has an aide for 2 hours a day 5 days a week.               Action/Plan: Pt with 1 positive BC and may need IV antibiotics for 2 weeks as outpatient. Plan is for TEE tomorrow and then PICC when 72 hours after negative BC.  CM met with the patient and she does not have anyone to assist her at home other than her aide. She has poor vision and would most likely not be able to see to administer the antibiotics at home. CM inquired about rehab for IV antibiotics and she was in agreement. CSW updated. Pt asked to be faxed out in the [facility] area. CM will follow.  Expected Discharge Date:  03/18/17               Expected Discharge Plan:  [place]  In-House Referral:  Clinical Social Work  Discharge planning Services  CM Consult  Post Acute Care Choice:    Choice offered to:     DME Arranged:    DME Agency:     HH Arranged:    [place] Agency:     Status of Service:  In process, will continue to follow  If discussed at Long Length of Stay Meetings, dates discussed:    Additional Comments:  Pollie Friar, RN 03/16/2017, 4:44 PM

## 2017-03-16 NOTE — Progress Notes (Signed)
    [facility] for Infectious Disease    Date of Admission:  03/12/2017     ID: Kelsey Hanna is a 67 y.o. female who presented initially with slurred speech and difficulty walking. Acute CVA ruled out but found to be hypotensive and admitted for work up. Also s/p coiling of R ICA aneurysm. Blood cultures grew MRSA, likely incidental.   Antibiotics: 1/25 IV Vancomycin >>   Cultures: 1/24 Blood >> MRSA 1/26 Blood >> NG x 1 day   Subjective: Doing well this morning, groin pain from hematoma improving. Inquiring about discharge.   Medications:  . aspirin  325 mg Oral Q breakfast  . atorvastatin  40 mg Oral QPM  . Chlorhexidine Gluconate Cloth  6 each Topical Q0600  . clonazePAM  0.5 mg Oral BID  . clopidogrel  75 mg Oral Q breakfast  . gabapentin  100 mg Oral BID  . levothyroxine  150 mcg Oral QAC breakfast  . linaclotide  145 mcg Oral QAC breakfast  . mupirocin ointment  1 application Nasal BID  . OLANZapine  20 mg Oral QHS    Objective: Vital signs in last 24 hours: Temp:  [98.2 F (36.8 C)-99.8 F (37.7 C)] 98.6 F (37 C) (01/28 0529) Pulse Rate:  [54-116] 79 (01/28 0529) Resp:  [15-24] 20 (01/28 0529) BP: (99-145)/(49-113) 115/55 (01/28 0529) SpO2:  [94 %-100 %] 95 % (01/28 0529) Weight:  [215 lb 13.3 oz (97.9 kg)] 215 lb 13.3 oz (97.9 kg) (01/27 2140)  Constitutional: Obese, NAD, appears comfortable HEENT: Atraumatic, normocephalic. PERRL, anicteric sclera.  Neck: Supple, trachea midline.  Cardiovascular: RRR, no murmurs, rubs, or gallops.  Pulmonary/Chest: CTAB, no wheezes, rales, or rhonchi. No chest wall abnormalities.  Abdominal: Distended due to habitus but soft, non tender,  +BS.  Extremities: Warm and well perfused. no edema.  Neurological: A&Ox3, CN II - XII grossly intact.  Skin: No rashes or erythema  Psychiatric: Normal mood and affect  Lab Results Recent Labs    03/14/17 0423  WBC 13.4*  HGB 10.2*  HCT 30.5*  NA 140  K 4.1  CL 109    CO2 21*  BUN 7  CREATININE 0.70   Assessment/Plan: Repeat cultures negative thus far. Checking vancomycin level today. Scheduled for TEE tomorrow morning. If negative for vegetations, will plan for 2 weeks of IV abx from negative culture date. PICC line can be placed once cultures are negative x 72 hours.   [facility] for Infectious Diseases Cell: [phone] Pager: [phone]  03/16/2017, 9:32 AM

## 2017-03-16 NOTE — NC FL2 (Signed)
[place] LEVEL OF CARE SCREENING TOOL     IDENTIFICATION  Patient Name: Kelsey Hanna Birthdate: August 13, 1950 Sex: female Admission Date (Current Location): 03/12/2017  [facility] and [place] Number:  Herbalist and Address:  [facility], [place] [address] [place], [place] [address]      Provider Number: [id number]  Attending Physician Name and Address:  Patrecia Pour, Christean Grief, MD  Relative Name and Phone Number:       Current Level of Care: Hospital Recommended Level of Care: [place] Prior Approval Number:    Date Approved/Denied:   PASRR Number:    Discharge Plan: SNF    Current Diagnoses: Patient Active Problem List   Diagnosis Date Noted  . MRSA bacteremia   . Brain aneurysm 03/13/2017  . Internal carotid aneurysm   . Slurred speech 03/12/2017  . Hypotension 03/12/2017  . Dysarthria   . Left sided numbness   . Acute left-sided muscle weakness 01/24/2017  . Stroke (cerebrum) ([place]) 01/24/2017  . Intracranial aneurysm 01/24/2017  . Encounter for medication review 02/07/2016  . Hypoxia, sleep related 09/07/2015  . Constipation 07/30/2015  . Dizziness 07/30/2015  . Chronic diastolic CHF (congestive heart failure) ([place]) 06/26/2015  . Bilateral lower extremity edema 06/26/2015  . Mitral regurgitation 05/17/2015  . Accidental drug overdose 05/17/2015  . Respiratory failure with hypoxia and hypercapnia ([place]) 05/09/2015  . OSA (obstructive sleep apnea) 05/09/2015  . Overdose 05/09/2015  . Chronic back pain 03/20/2015  . Right shoulder pain 11/14/2014  . Insomnia 11/14/2014  . Psychoses ([place])   . Bacteremia   . Pedal edema 09/12/2014  . Bipolar disorder, current episode manic without psychotic features, severe ([place])   . Bipolar disorder ([place]) 09/05/2014  . Bipolar affective disorder, current episode manic with psychotic symptoms ([place]) 09/05/2014  . Bipolar affective disorder, manic ([place]) 09/03/2014  .  Encounter for preadmission testing   . Head revolving around 07/27/2014  . BP (high blood pressure) 05/23/2014  . AF (paroxysmal atrial fibrillation) ([place]) 05/23/2014  . Type 2 diabetes mellitus ([place]) 04/27/2014  . Essential hypertension 04/27/2014  . Hypothyroidism 04/27/2014  . Tobacco abuse 04/27/2014  . Bipolar I disorder ([place]) 04/20/2014  . Acute encephalopathy 04/18/2014  . Acute respiratory failure ([place])   . Encounter for feeding tube placement   . Shortness of breath   . Gout 08/25/2013  . 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 HLD (hyperlipidemia) 08/30/2009    Orientation RESPIRATION BLADDER Height & Weight     Self, Time, Situation  Normal Continent Weight: 215 lb 13.3 oz (97.9 kg) Height:  5\' 3"  (160 cm)  BEHAVIORAL SYMPTOMS/MOOD NEUROLOGICAL BOWEL NUTRITION STATUS      Continent Diet(regular)  AMBULATORY STATUS COMMUNICATION OF NEEDS Skin   Limited Assist Verbally Surgical wounds                       Personal Care Assistance Level of Assistance  Bathing, Dressing Bathing Assistance: Limited assistance   Dressing Assistance: Limited assistance     Functional Limitations Info             SPECIAL CARE FACTORS FREQUENCY  PT (By licensed PT), OT (By licensed OT)     PT Frequency: 5/wk OT Frequency: 5/wk            Contractures      Additional Factors Info  Code Status, Allergies, Isolation Precautions Code Status Info: FULL Allergies Info: NKA  Isolation Precautions Info: MRSA     Current Medications (03/16/2017):  This is the current hospital active medication list Current Facility-Administered Medications  Medication Dose Route Frequency Provider Last Rate Last Dose  . acetaminophen (TYLENOL) tablet 650 mg  650 mg Oral Q4H PRN Deveshwar, Willaim Rayas, MD       Or  . acetaminophen (TYLENOL) solution 650 mg  650 mg Per Tube Q4H PRN Deveshwar, Sanjeev, MD       Or  . acetaminophen (TYLENOL) suppository 650 mg  650 mg Rectal Q4H PRN  Deveshwar, Sanjeev, MD      . acetaminophen (TYLENOL) tablet 650 mg  650 mg Oral Q6H PRN Radene Gunning, NP   650 mg at 03/15/17 2011   Or  . acetaminophen (TYLENOL) suppository 650 mg  650 mg Rectal Q6H PRN Radene Gunning, NP      . aspirin tablet 325 mg  325 mg Oral Q breakfast Luanne Bras, MD   325 mg at 03/16/17 0831  . atorvastatin (LIPITOR) tablet 40 mg  40 mg Oral QPM Radene Gunning, NP   40 mg at 03/15/17 1739  . Chlorhexidine Gluconate Cloth 2 % PADS 6 each  6 each Topical Q0600 Luanne Bras, MD   6 each at 03/16/17 0801  . clonazePAM (KLONOPIN) tablet 0.5 mg  0.5 mg Oral BID Dyanne Carrel M, NP   0.5 mg at 03/16/17 1033  . clopidogrel (PLAVIX) tablet 75 mg  75 mg Oral Q breakfast Luanne Bras, MD   75 mg at 03/16/17 0831  . docusate sodium (COLACE) capsule 100 mg  100 mg Oral Daily PRN Radene Gunning, NP   100 mg at 03/15/17 1223  . gabapentin (NEURONTIN) capsule 100 mg  100 mg Oral BID Dyanne Carrel M, NP   100 mg at 03/16/17 1033  . hydrALAZINE (APRESOLINE) injection 5 mg  5 mg Intravenous Q20 Min PRN Deveshwar, Willaim Rayas, MD      . ketorolac (TORADOL) 15 MG/ML injection 15 mg  15 mg Intravenous Q8H PRN Patrecia Pour, Christean Grief, MD      . levothyroxine (SYNTHROID, LEVOTHROID) tablet 150 mcg  150 mcg Oral QAC breakfast Radene Gunning, NP   150 mcg at 03/16/17 0831  . linaclotide (LINZESS) capsule 145 mcg  145 mcg Oral QAC breakfast Radene Gunning, NP   145 mcg at 03/16/17 1033  . mupirocin ointment (BACTROBAN) 2 % 1 application  1 application Nasal BID Luanne Bras, MD   1 application at 07/37/10 1034  . nicardipine (CARDENE) 20mg  in 0.86% saline 274ml IV infusion (0.1 mg/ml)  0-15 mg/hr Intravenous Continuous Deveshwar, Sanjeev, MD      . OLANZapine (ZYPREXA) tablet 20 mg  20 mg Oral QHS Radene Gunning, NP   20 mg at 03/15/17 2216  . ondansetron (ZOFRAN) tablet 4 mg  4 mg Oral Q6H PRN Radene Gunning, NP       Or  . ondansetron [facility]) injection 4 mg  4 mg Intravenous  Q6H PRN Radene Gunning, NP      . oxyCODONE (Oxy IR/ROXICODONE) immediate release tablet 10 mg  10 mg Oral Q6H PRN Patrecia Pour, Christean Grief, MD   10 mg at 03/16/17 0831  . tiZANidine (ZANAFLEX) tablet 4 mg  4 mg Oral Q8H PRN Radene Gunning, NP   4 mg at 03/16/17 1136  . vancomycin (VANCOCIN) IVPB 1000 mg/200 mL premix  1,000 mg Intravenous Q12H Rumbarger, Valeda Malm, RPH   Stopped at 03/16/17 1240  Discharge Medications: Please see discharge summary for a list of discharge medications.  Relevant Imaging Results:  Relevant Lab Results:   Additional Information SS#: [id number]; will need IV vanc for 2 weeks  Velora Horstman, Connye Burkitt, LCSW

## 2017-03-16 NOTE — H&P (View-Only) (Signed)
    [facility] for Infectious Disease    Date of Admission:  03/12/2017     ID: Kelsey Hanna is a 67 y.o. female who presented initially with slurred speech and difficulty walking. Acute CVA ruled out but found to be hypotensive and admitted for work up. Also s/p coiling of R ICA aneurysm. Blood cultures grew MRSA, likely incidental.   Antibiotics: 1/25 IV Vancomycin >>   Cultures: 1/24 Blood >> MRSA 1/26 Blood >> NG x 1 day   Subjective: Doing well this morning, groin pain from hematoma improving. Inquiring about discharge.   Medications:  . aspirin  325 mg Oral Q breakfast  . atorvastatin  40 mg Oral QPM  . Chlorhexidine Gluconate Cloth  6 each Topical Q0600  . clonazePAM  0.5 mg Oral BID  . clopidogrel  75 mg Oral Q breakfast  . gabapentin  100 mg Oral BID  . levothyroxine  150 mcg Oral QAC breakfast  . linaclotide  145 mcg Oral QAC breakfast  . mupirocin ointment  1 application Nasal BID  . OLANZapine  20 mg Oral QHS    Objective: Vital signs in last 24 hours: Temp:  [98.2 F (36.8 C)-99.8 F (37.7 C)] 98.6 F (37 C) (01/28 0529) Pulse Rate:  [54-116] 79 (01/28 0529) Resp:  [15-24] 20 (01/28 0529) BP: (99-145)/(49-113) 115/55 (01/28 0529) SpO2:  [94 %-100 %] 95 % (01/28 0529) Weight:  [215 lb 13.3 oz (97.9 kg)] 215 lb 13.3 oz (97.9 kg) (01/27 2140)  Constitutional: Obese, NAD, appears comfortable HEENT: Atraumatic, normocephalic. PERRL, anicteric sclera.  Neck: Supple, trachea midline.  Cardiovascular: RRR, no murmurs, rubs, or gallops.  Pulmonary/Chest: CTAB, no wheezes, rales, or rhonchi. No chest wall abnormalities.  Abdominal: Distended due to habitus but soft, non tender,  +BS.  Extremities: Warm and well perfused. no edema.  Neurological: A&Ox3, CN II - XII grossly intact.  Skin: No rashes or erythema  Psychiatric: Normal mood and affect  Lab Results Recent Labs    03/14/17 0423  WBC 13.4*  HGB 10.2*  HCT 30.5*  NA 140  K 4.1  CL 109    CO2 21*  BUN 7  CREATININE 0.70   Assessment/Plan: Repeat cultures negative thus far. Checking vancomycin level today. Scheduled for TEE tomorrow morning. If negative for vegetations, will plan for 2 weeks of IV abx from negative culture date. PICC line can be placed once cultures are negative x 72 hours.   [facility] for Infectious Diseases Cell: [phone] Pager: [phone]  03/16/2017, 9:32 AM

## 2017-03-16 NOTE — Progress Notes (Signed)
[facility] Infusion Coordinator will follow pt with ID team to support home IV ABX as needed at DC.  If patient discharges after hours, please call [phone].   Larry Sierras 03/16/2017, 2:03 PM

## 2017-03-16 NOTE — Progress Notes (Signed)
PROGRESS NOTE  SANA TESSMER RWE:[id number] DOB: 04/27/1950 DOA: 03/12/2017 PCP: Patient, No Pcp Per   LOS: 3 days   Brief Narrative / Interim history: 67 y.o. 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 Early January patient was found to have bilateral ICA aneurysms and planned to have IR clipping procedure 1/23, but was unable as postop care was unarranged. 1/24 patient presented to the ED with slurred speech and difficulty walking. Patient denied dizziness, fever, chest pain, fever, chills, numbness/tingling. Code stroke was activated. Noncontrast CT showed no acute process. CT angiogram of the head and neck and a CT perfusion study revealed unchanged aneurysms. Neuro evaluated patient and she was admitted for hypotension.  1/25  IR coiling procedure performed. Blood cultures positive for MRSA, being treated.  Post IR procedure hematoma in right groin. US revealed no evidence of pseudoaneurysm or fistula. 2D echo 1/26 revealed LVEF 65-70% Grade 1 DD. Patient waiting for TEE.    Assessment & Plan: Principal Problem:   Hypotension Active Problems:   Bipolar I disorder (HCC)   Essential hypertension   Hypothyroidism   HLD (hyperlipidemia)   Chronic back pain   OSA (obstructive sleep apnea)   Mitral regurgitation   Chronic diastolic CHF (congestive heart failure) (HCC)   Intracranial aneurysm   Slurred speech   Brain aneurysm   Internal carotid aneurysm   MRSA bacteremia  MRSA bacteremia Unclear etiology. Lactic acid normal. No fever.  ID consulted. Repeat Blood cultures at 72 hours before PICC line placment  TEE scheduled tomorrow to check heart etiology continue Vancomycin  Internal carotid aneurysm Patient had coiling procedure IR 1/25 Patient stable, able to walk today Continue  plavix and aspirin  Right groin hematoma Post IR procedure. Positive for mild tenderness US revealed no fistula or pseudoaeurysm Continue to monitor  Hypotension Likely due to bacteremia Improving. Last B/P 115/55 Continue IVF  Chronic Diastolic CHF Echo 6/19 LVEF 65-70% mild LVH, Grade 1 DD No SOB, 1+ LE edema Continue to monitor fluid levels  Chronic back pain Reports Coccyx pain Pain to improve with walking Continue PT Continue dilaudid, Oxycodone PRN, muscle relaxer  Hypothyroidism Last TSH 10.396 Increase Synthroid Recheck in 6 weeks  Hyperlipidemia Continue statin  DVT prophylaxis: Aspirin, plavix Code Status: FULL Family Communication: none Disposition Plan: home  Consultants:   none  Procedures:   TEE  Antimicrobials:  Vancomycin IV   Subjective: Patient was alert sitting in bed. Today was her first day walking. Denied chest pain, SOB.  She reported pain in coccyx and occasional headaches.   Objective: Vitals:   03/15/17 2000 03/15/17 2140 03/16/17 0136 03/16/17 0529  BP:  (!) 116/58 119/61 (!) 115/55  Pulse:  89 88 79  Resp:  20 20 20   Temp: 98.2 F (36.8 C) 99.8 F (37.7 C) 99.2 F (37.3 C) 98.6 F (37 C)  TempSrc: Oral Oral Oral Oral  SpO2:  100% 100% 95%  Weight:  97.9 kg (215 lb 13.3 oz)    Height:  5\' 3"  (1.6 m)      Intake/Output Summary (Last 24 hours) at 03/16/2017 1102 Last data filed at 03/16/2017 0354 Gross per 24 hour  Intake 2741 ml  Output 1875 ml  Net 866 ml   Filed Weights   03/13/17 0851 03/13/17 1627 03/15/17 2140  Weight: 90.7 kg (199 lb 16  oz) 92.1 kg (203 lb 0.7 oz) 97.9 kg (215 lb 13.3 oz)    Examination:  Constitutional: NAD Eyes: 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  Neck: normal, supple Respiratory: clear to auscultation bilaterally, no wheezing, no crackles. Normal respiratory effort. No accessory muscle use.  Cardiovascular: Regular  rate and rhythm, no murmurs / rubs / gallops. minor LE edema. 2+ pedal pulses.  Abdomen: no tenderness. Bowel sounds positive.  Musculoskeletal:  No joint deformity upper and lower extremities. No contractures. Normal muscle tone;  Skin: +hematoma right groin with tenderness Neurologic: CN 2-12 grossly intact. Strength 5/5 in all 4.  Psychiatric: Normal judgment and insight. Alert and oriented x 3. Normal mood.    Data Reviewed: I have independently reviewed following labs and imaging studies   CBC: Recent Labs  Lab 03/12/17 1324 03/12/17 1400 03/13/17 0603 03/14/17 0423  WBC  --  8.6 7.9 13.4*  NEUTROABS  --  5.0  --  10.6*  HGB 13.6 11.3* 11.8* 10.2*  HCT 40.0 35.6* 36.8 30.5*  MCV  --  91.3 90.9 89.7  PLT  --  206 218 644   Basic Metabolic Panel: Recent Labs  Lab 03/12/17 1324 03/12/17 1400 03/13/17 0603 03/14/17 0423  NA 140 136 141 140  K 4.4 4.1 4.4 4.1  CL 103 103 111 109  CO2  --  25 22 21*  GLUCOSE 100* 92 99 118*  BUN 13 13 12 7   CREATININE 0.90 0.93 0.75 0.70  CALCIUM  --  8.3* 8.6* 8.6*   GFR: Estimated Creatinine Clearance: 75 mL/min (by C-G formula based on SCr of 0.7 mg/dL). Liver Function Tests: Recent Labs  Lab 03/12/17 1400  AST 18  ALT 17  ALKPHOS 55  BILITOT 0.4  PROT 5.1*  ALBUMIN 3.0*   No results for input(s): LIPASE, AMYLASE in the last 168 hours. No results for input(s): AMMONIA in the last 168 hours. Coagulation Profile: Recent Labs  Lab 03/12/17 1400  INR 1.10   Cardiac Enzymes: No results for input(s): CKTOTAL, CKMB, CKMBINDEX, TROPONINI in the last 168 hours. BNP (last 3 results) No results for input(s): PROBNP in the last 8760 hours. HbA1C: No results for input(s): HGBA1C in the last 72 hours. CBG: Recent Labs  Lab 03/13/17 0620 03/13/17 1357 03/13/17 2108  GLUCAP 106* 138* 117*   Lipid Profile: No results for input(s): CHOL, HDL, LDLCALC, TRIG, CHOLHDL, LDLDIRECT in the last 72 hours. Thyroid Function  Tests: No results for input(s): TSH, T4TOTAL, FREET4, T3FREE, THYROIDAB in the last 72 hours. Anemia Panel: No results for input(s): VITAMINB12, FOLATE, FERRITIN, TIBC, IRON, RETICCTPCT in the last 72 hours. Urine analysis:    Component Value Date/Time   COLORURINE YELLOW 03/12/2017 1400   APPEARANCEUR HAZY (A) 03/12/2017 1400   LABSPEC 1.030 03/12/2017 1400   PHURINE 5.0 03/12/2017 1400   GLUCOSEU NEGATIVE 03/12/2017 1400   HGBUR NEGATIVE 03/12/2017 1400   BILIRUBINUR NEGATIVE 03/12/2017 1400   KETONESUR NEGATIVE 03/12/2017 1400   PROTEINUR NEGATIVE 03/12/2017 1400   UROBILINOGEN 0.2 10/20/2014 0814   NITRITE NEGATIVE 03/12/2017 1400   LEUKOCYTESUR NEGATIVE 03/12/2017 1400   Sepsis Labs: Invalid input(s): PROCALCITONIN, LACTICIDVEN  Recent Results (from the past 240 hour(s))  Blood culture (routine x 2)     Status: Abnormal   Collection Time: 03/12/17  2:35 PM  Result Value Ref Range Status   Specimen Description BLOOD LEFT ARM  Final   Special Requests   Final  BOTTLES DRAWN AEROBIC AND ANAEROBIC Blood Culture adequate volume   Culture  Setup Time   Final    GRAM POSITIVE COCCI IN CLUSTERS ANAEROBIC BOTTLE ONLY CRITICAL RESULT CALLED TO, READ BACK BY AND VERIFIED WITH: N. Batchelder Pharm.D. 12:25 03/13/17 ([email or web address])    Culture METHICILLIN RESISTANT STAPHYLOCOCCUS AUREUS (A)  Final   Report Status 03/15/2017 FINAL  Final   Organism ID, Bacteria METHICILLIN RESISTANT STAPHYLOCOCCUS AUREUS  Final      Susceptibility   Methicillin resistant staphylococcus aureus - MIC*    CIPROFLOXACIN >=8 RESISTANT Resistant     ERYTHROMYCIN >=8 RESISTANT Resistant     GENTAMICIN <=0.5 SENSITIVE Sensitive     OXACILLIN RESISTANT Resistant     TETRACYCLINE <=1 SENSITIVE Sensitive     VANCOMYCIN 1 SENSITIVE Sensitive     TRIMETH/SULFA <=10 SENSITIVE Sensitive     CLINDAMYCIN >=8 RESISTANT Resistant     RIFAMPIN <=0.5 SENSITIVE Sensitive     Inducible Clindamycin NEGATIVE Sensitive      * METHICILLIN RESISTANT STAPHYLOCOCCUS AUREUS  Blood Culture ID Panel (Reflexed)     Status: Abnormal   Collection Time: 03/12/17  2:35 PM  Result Value Ref Range Status   Enterococcus species NOT DETECTED NOT DETECTED Final   Listeria monocytogenes NOT DETECTED NOT DETECTED Final   Staphylococcus species DETECTED (A) NOT DETECTED Final    Comment: CRITICAL RESULT CALLED TO, READ BACK BY AND VERIFIED WITH: N. Batchelder Pharm.D. 12:25 03/13/17 ([email or web address])    Staphylococcus aureus DETECTED (A) NOT DETECTED Final    Comment: Methicillin (oxacillin)-resistant Staphylococcus aureus (MRSA). MRSA is predictably resistant to beta-lactam antibiotics (except ceftaroline). Preferred therapy is vancomycin unless clinically contraindicated. Patient requires contact precautions if  hospitalized. CRITICAL RESULT CALLED TO, READ BACK BY AND VERIFIED WITH: N. Batchelder Pharm.D. 12:25 03/13/17 ([email or web address])    Methicillin resistance DETECTED (A) NOT DETECTED Final    Comment: CRITICAL RESULT CALLED TO, READ BACK BY AND VERIFIED WITH: N. Batchelder Pharm.D. 12:25 03/13/17 ([email or web address])    Streptococcus species NOT DETECTED NOT DETECTED Final   Streptococcus agalactiae NOT DETECTED NOT DETECTED Final   Streptococcus pneumoniae NOT DETECTED NOT DETECTED Final   Streptococcus pyogenes NOT DETECTED NOT DETECTED Final   Acinetobacter baumannii NOT DETECTED NOT DETECTED Final   Enterobacteriaceae species NOT DETECTED NOT DETECTED Final   Enterobacter cloacae complex NOT DETECTED NOT DETECTED Final   Escherichia coli NOT DETECTED NOT DETECTED Final   Klebsiella oxytoca NOT DETECTED NOT DETECTED Final   Klebsiella pneumoniae NOT DETECTED NOT DETECTED Final   Proteus species NOT DETECTED NOT DETECTED Final   Serratia marcescens NOT DETECTED NOT DETECTED Final   Haemophilus influenzae NOT DETECTED NOT DETECTED Final   Neisseria meningitidis NOT DETECTED NOT DETECTED Final   Pseudomonas aeruginosa NOT DETECTED NOT DETECTED  Final   Candida albicans NOT DETECTED NOT DETECTED Final   Candida glabrata NOT DETECTED NOT DETECTED Final   Candida krusei NOT DETECTED NOT DETECTED Final   Candida parapsilosis NOT DETECTED NOT DETECTED Final   Candida tropicalis NOT DETECTED NOT DETECTED Final  Blood culture (routine x 2)     Status: None (Preliminary result)   Collection Time: 03/12/17  2:45 PM  Result Value Ref Range Status   Specimen Description BLOOD RIGHT HAND  Final   Special Requests   Final    BOTTLES DRAWN AEROBIC AND ANAEROBIC Blood Culture adequate volume   Culture NO GROWTH 3 DAYS  Final   Report Status PENDING  Incomplete  MRSA PCR Screening  Status: Abnormal   Collection Time: 03/13/17  4:29 PM  Result Value Ref Range Status   MRSA by PCR POSITIVE (A) NEGATIVE Final    Comment:        The GeneXpert MRSA Assay (FDA approved for NASAL specimens only), is one component of a comprehensive MRSA colonization surveillance program. It is not intended to diagnose MRSA infection nor to guide or monitor treatment for MRSA infections. CRITICAL RESULT CALLED TO, READ BACK BY AND VERIFIED WITH: Melburn Hake, RN AT 2015 ON 03/13/17 BY C. JESSUP, MLT.   Culture, blood (Routine X 2) w Reflex to ID Panel     Status: None (Preliminary result)   Collection Time: 03/14/17 11:41 AM  Result Value Ref Range Status   Specimen Description BLOOD LEFT FOREARM  Final   Special Requests IN PEDIATRIC BOTTLE Blood Culture adequate volume  Final   Culture NO GROWTH 1 DAY  Final   Report Status PENDING  Incomplete  Culture, blood (Routine X 2) w Reflex to ID Panel     Status: None (Preliminary result)   Collection Time: 03/14/17 11:41 AM  Result Value Ref Range Status   Specimen Description BLOOD LEFT ANTECUBITAL  Final   Special Requests IN PEDIATRIC BOTTLE Blood Culture adequate volume  Final   Culture NO GROWTH 1 DAY  Final   Report Status PENDING  Incomplete      Radiology Studies: No results found.   Scheduled  Meds: . aspirin  325 mg Oral Q breakfast  . atorvastatin  40 mg Oral QPM  . Chlorhexidine Gluconate Cloth  6 each Topical Q0600  . clonazePAM  0.5 mg Oral BID  . clopidogrel  75 mg Oral Q breakfast  . gabapentin  100 mg Oral BID  . levothyroxine  150 mcg Oral QAC breakfast  . linaclotide  145 mcg Oral QAC breakfast  . mupirocin ointment  1 application Nasal BID  . OLANZapine  20 mg Oral QHS   Continuous Infusions: . sodium chloride 90 mL/hr at 03/16/17 0251  . niCARDipine    . vancomycin Stopped (03/16/17 0043)       Time spent:    Talen Poser Kathlen Mody, PA-S

## 2017-03-16 NOTE — Progress Notes (Signed)
Physical Therapy Treatment Patient Details Name: Kelsey Hanna MRN: [id number] DOB: 1950-09-12 Today's Date: 03/16/2017    History of Present Illness 67 y.o. female admitted on 03/12/17 for slurred speech and difficulty walking.  She was found to have hypotension, bil ICA aneurysms and IR consulted. 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  Pt with significant PMH of DM, CA, bipolar d/o, and lumbar laminectomy and decompression.    PT Comments    Pt progressing slowly with therapy. Session limited by patients reports of weakness from NPO as well as dizziness. Progressed to ambulation in hallway for short distances, min A to maintain balance and stability. Anticipate patient will improve in subsequent PT sessions and do not need to ammend recommendations at this time.    Follow Up Recommendations  Home health PT;Supervision - Intermittent     Equipment Recommendations  None recommended by PT    Recommendations for Other Services       Precautions / Restrictions Precautions Precautions: Fall Precaution Comments: poor vision at baseline, legally blind Restrictions Weight Bearing Restrictions: No    Mobility  Bed Mobility Overal bed mobility: Needs Assistance Bed Mobility: Supine to Sit     Supine to sit: HOB elevated;Min assist     General bed mobility comments: Min assist mostly to help progress right leg to EOB.   Transfers Overall transfer level: Needs assistance Equipment used: Rolling walker (2 wheeled) Transfers: Sit to/from Stand Sit to Stand: Min assist         General transfer comment: min A to min guard for saftey. pt reports some dizziness made worse when standing, stabilized for short time before advancing to ambulation.   Ambulation/Gait Ambulation/Gait assistance: Min assist Ambulation Distance (Feet): 60 Feet Assistive device: Rolling walker (2 wheeled) Gait Pattern/deviations: Step-to  pattern;Shuffle;Decreased stride length Gait velocity: decreased   General Gait Details: Patient narrow base of support, short step length BL, and min A to maintain balance. Progressed distance utilizing RW, however patient felt dizzy and returned back room.    Stairs            Wheelchair Mobility    Modified Rankin (Stroke Patients Only)       Balance Overall balance assessment: Needs assistance Sitting-balance support: Feet supported;Bilateral upper extremity supported Sitting balance-Leahy Scale: Fair     Standing balance support: Bilateral upper extremity supported Standing balance-Leahy Scale: Fair                              Cognition Arousal/Alertness: Awake/alert Behavior During Therapy: WFL for tasks assessed/performed Overall Cognitive Status: Within Functional Limits for tasks assessed                                        Exercises      General Comments General comments (skin integrity, edema, etc.): SpO2= 100%, BP= 156/65 after activity.       Pertinent Vitals/Pain Pain Assessment: Faces Faces Pain Scale: Hurts little more Pain Location: right groin Pain Descriptors / Indicators: Aching;Burning Pain Intervention(s): Limited activity within patient's tolerance;Monitored during session;Premedicated before session    Home Living                      Prior Function            PT  Goals (current goals can now be found in the care plan section) Acute Rehab PT Goals Patient Stated Goal: to get back to her normal level of function PT Goal Formulation: With patient Time For Goal Achievement: 03/29/17 Potential to Achieve Goals: Fair Progress towards PT goals: Progressing toward goals    Frequency    Min 3X/week      PT Plan Current plan remains appropriate    Co-evaluation              AM-PAC PT "6 Clicks" Daily Activity  Outcome Measure  Difficulty turning over in bed (including adjusting  bedclothes, sheets and blankets)?: Unable Difficulty moving from lying on back to sitting on the side of the bed? : Unable Difficulty sitting down on and standing up from a chair with arms (e.g., wheelchair, bedside commode, etc,.)?: Unable Help needed moving to and from a bed to chair (including a wheelchair)?: A Little Help needed walking in hospital room?: A Little Help needed climbing 3-5 steps with a railing? : A Little 6 Click Score: 12    End of Session Equipment Utilized During Treatment: Gait belt Activity Tolerance: Patient limited by fatigue(limited by weakness from NPO/ dizziness)   Nurse Communication: Mobility status;Other (comment) PT Visit Diagnosis: Unsteadiness on feet (R26.81);Difficulty in walking, not elsewhere classified (R26.2);Pain Pain - Right/Left: Right Pain - part of body: Hip     Time: 9390-3009 PT Time Calculation (min) (ACUTE ONLY): 20 min  Charges:  $Gait Training: 8-22 mins                    G Codes:       Reinaldo Berber, PT, DPT Acute Rehab Services Pager: [phone]     Reinaldo Berber 03/16/2017, 10:27 AM

## 2017-03-17 ENCOUNTER — Encounter (HOSPITAL_COMMUNITY)
Admission: EM | Disposition: A | Discharge status: Skilled Nursing Facility | Payer: Self-pay | Source: Home / Self Care | Attending: Interventional Radiology

## 2017-03-17 ENCOUNTER — Inpatient Hospital Stay (HOSPITAL_COMMUNITY): Payer: Medicare Other

## 2017-03-17 ENCOUNTER — Encounter (HOSPITAL_COMMUNITY): Payer: Self-pay | Admitting: *Deleted

## 2017-03-17 DIAGNOSIS — R7881 Bacteremia: Secondary | ICD-10-CM

## 2017-03-17 HISTORY — PX: TEE WITHOUT CARDIOVERSION: SHX5443

## 2017-03-17 LAB — CULTURE, BLOOD (ROUTINE X 2)
CULTURE: NO GROWTH
SPECIAL REQUESTS: ADEQUATE

## 2017-03-17 LAB — BASIC METABOLIC PANEL
Anion gap: 10 (ref 5–15)
BUN: 9 mg/dL (ref 6–20)
CHLORIDE: 108 mmol/L (ref 101–111)
CO2: 24 mmol/L (ref 22–32)
CREATININE: 0.7 mg/dL (ref 0.44–1.00)
Calcium: 9 mg/dL (ref 8.9–10.3)
GFR calc Af Amer: >60 mL/min (ref >60)
GFR calc non Af Amer: >60 mL/min (ref >60)
Glucose, Bld: 121 mg/dL — ABNORMAL HIGH (ref 65–99)
Potassium: 4.2 mmol/L (ref 3.5–5.1)
Sodium: 142 mmol/L (ref 135–145)

## 2017-03-17 LAB — VANCOMYCIN, TROUGH: Vancomycin Tr: 16 ug/mL (ref 15–20)

## 2017-03-17 LAB — HCV COMMENT:

## 2017-03-17 LAB — HEPATITIS C ANTIBODY (REFLEX): HCV Ab: <0.1 s/co ratio (ref 0.0–0.9)

## 2017-03-17 SURGERY — ECHOCARDIOGRAM, TRANSESOPHAGEAL
Anesthesia: Moderate Sedation

## 2017-03-17 MED ORDER — FENTANYL CITRATE (PF) 100 MCG/2ML IJ SOLN
INTRAMUSCULAR | Status: AC
Start: 2017-03-17 — End: 2017-03-17
  Filled 2017-03-17: qty 2

## 2017-03-17 MED ORDER — ZOLPIDEM TARTRATE 5 MG PO TABS: 5.0000 mg | ORAL_TABLET | Freq: Once | ORAL | Status: DC

## 2017-03-17 MED ORDER — MIDAZOLAM HCL 5 MG/ML IJ SOLN
INTRAMUSCULAR | Status: AC
  Filled 2017-03-17: qty 2

## 2017-03-17 MED ORDER — SODIUM CHLORIDE 0.9% FLUSH
10.0000 mL | INTRAVENOUS | Status: DC | PRN
  Administered 2017-03-18: 10 mL
  Filled 2017-03-17: qty 40

## 2017-03-17 MED ORDER — FENTANYL CITRATE (PF) 100 MCG/2ML IJ SOLN
INTRAMUSCULAR | Status: DC | PRN
  Administered 2017-03-17 (×2): 25 ug via INTRAVENOUS

## 2017-03-17 MED ORDER — BUTAMBEN-TETRACAINE-BENZOCAINE 2-2-14 % EX AERO
INHALATION_SPRAY | CUTANEOUS | Status: DC | PRN
  Administered 2017-03-17: 2 via TOPICAL

## 2017-03-17 MED ORDER — SODIUM CHLORIDE 0.9 % IV SOLN: INTRAVENOUS | Status: DC

## 2017-03-17 MED ORDER — ZOLPIDEM TARTRATE 5 MG PO TABS
5.0000 mg | ORAL_TABLET | Freq: Every evening | ORAL | Status: DC | PRN
  Administered 2017-03-17: 5 mg via ORAL
  Filled 2017-03-17: qty 1

## 2017-03-17 MED ORDER — MIDAZOLAM HCL 10 MG/2ML IJ SOLN
INTRAMUSCULAR | Status: DC | PRN
  Administered 2017-03-17 (×2): 2 mg via INTRAVENOUS
  Administered 2017-03-17: 1 mg via INTRAVENOUS

## 2017-03-17 MED ORDER — VANCOMYCIN IV (FOR PTA / DISCHARGE USE ONLY): 1000.0000 mg | Freq: Two times a day (BID) | INTRAVENOUS | 0 refills | Status: AC

## 2017-03-17 NOTE — Progress Notes (Deleted)
  Echocardiogram 2D Echocardiogram has been performed.  Kelsey Hanna 03/17/2017, 10:03 AM

## 2017-03-17 NOTE — Progress Notes (Signed)
Referring Physician(s): Dr Patrecia Pour  Supervising Physician: Luanne Bras  Patient Status:  [facility] - In-pt  Chief Complaint:  Intra cranial aneurysm  Subjective:  Procedure 1.25: S/P bil;ateral common carotid arteriograms,followed by endovascular treatment of Rt para ophthalmic aneurysm using x 2 pipeline flow diverters. 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  Pt up in bed eating Reg diet Feels good Right groin painful-- no worse maybe slightly better [place] negative for Pseudoaneurysm-- + hematoma Ecchymotic are -- starting to extend to lower in thigh and around back Tender Hematoma still palpable at site-- no change  Allergies: Patient has no known allergies.  Medications: Prior to Admission medications   Medication Sig Start Date End Date Taking? Authorizing Provider  aspirin 325 MG tablet Take 1 tablet (325 mg total) by mouth daily. 01/24/17  Yes Aline August, MD  atorvastatin (LIPITOR) 40 MG tablet Take 1 tablet (40 mg total) by mouth every evening. 03/04/17  Yes [name], FNP  clonazePAM (KLONOPIN) 0.5 MG tablet Take 0.5 mg by mouth 2 (two) times daily. 1 in the morning and 1 midday   Yes [provider]  clopidogrel (PLAVIX) 75 MG tablet Take 75 mg by mouth daily.   Yes [provider]  diphenhydrAMINE (BENADRYL) 25 MG tablet Take 25 mg by mouth daily.   Yes [provider]  DOCQLACE 100 MG capsule TAKE 1 TO 2 CAPSULES BY MOUTH TWICE A DAY AS NEEDED FOR MILD CONSTIPATION 11/11/16  Yes Golden Circle, FNP  gabapentin (NEURONTIN) 100 MG capsule Take 100 mg by mouth 3 (three) times daily.   Yes [provider]  levothyroxine (SYNTHROID, LEVOTHROID) 150 MCG tablet TAKE 1 TABLET BY MOUTH DAILY BEFORE BREAKFAST  03/06/17  Yes [name], FNP  linaclotide [facility]) 145 MCG CAPS capsule Take 1 capsule (145 mcg total) by mouth daily before breakfast. 03/04/17  Yes [name], FNP  OLANZapine (ZYPREXA) 20 MG tablet Take 20 mg by mouth at bedtime.  02/11/17  Yes [provider]  Oxycodone HCl 10 MG TABS Take 10 mg by mouth every 6 (six) hours as needed (pain).   Yes [provider]  tiZANidine (ZANAFLEX) 4 MG tablet Take 4 mg by mouth every 8 (eight) hours as needed for muscle spasms.   Yes [provider]  zolpidem (AMBIEN) 5 MG tablet Take 1 tablet (5 mg total) by mouth at bedtime as needed for sleep. 03/06/17  Yes [name], FNP     Vital Signs: BP (!) 134/44 (BP Location: Right Arm)   Pulse 75   Temp 98.1 F (36.7 C) (Oral)   Resp (!) 24   Ht 5\' 3"  (1.6 m)   Wt 215 lb 13.3 oz (97.9 kg)   SpO2 100%   BMI 38.23 kg/m   Physical Exam  Musculoskeletal: Normal range of motion.  Neurological: She is alert.  Skin: Skin is warm and dry.  Right groin with large ecchymotic are from above groin to mid thigh Tender to touch No Pseudoaneurysm per [place] Hematoma is palpable at site: approx 4 cm around site Rt foot 2+ pulses     Imaging: No results found.  Labs:  CBC: Recent Labs    01/23/17 2236  03/12/17 1324 03/12/17 1400 03/13/17 0603 03/14/17 0423  WBC 8.7  --   --  8.6 7.9 13.4*  HGB 12.8   < > 13.6 11.3* 11.8* 10.2*  HCT 40.6   < > 40.0 35.6* 36.8 30.5*  PLT 230  --   --  206 218 237   < > = values in this interval not displayed.    COAGS: Recent Labs    01/23/17 2236 03/12/17 1400  INR 1.01 1.10  APTT 30 30    BMP: Recent Labs    03/12/17 1400 03/13/17 0603 03/14/17 0423 03/17/17 0527  NA 136 141 140 142  K 4.1 4.4 4.1 4.2  CL 103 111 109 108  CO2 25 22 21* 24  GLUCOSE 92 99 118* 121*  BUN 13 12 7 9   CALCIUM 8.3* 8.6* 8.6* 9.0  CREATININE 0.93 0.75 0.70 0.70  GFRNONAA >60 >60 >60 >60  GFRAA >60 >60  >60 >60    LIVER FUNCTION TESTS: Recent Labs    01/23/17 2236 03/04/17 1112 03/12/17 1400  BILITOT 0.5 0.4 0.4  AST 27 19 18   ALT 36 22 17  ALKPHOS 75 71 55  PROT 6.5 7.3 5.1*  ALBUMIN 3.8 4.4 3.0*    Assessment and Plan:  R ICA aneurysm embolization in IR 1/25 Rt groin stable hematoma Rt groin large ecchymotic area For DC to home per chart with [facility]  Electronically Signed: Tinslee Klare A, PA-C 03/17/2017, 11:18 AM   I spent a total of 15 Minutes at the the patient's bedside AND on the patient's hospital floor or unit, greater than 50% of which was counseling/coordinating care for Rt groin hematoma

## 2017-03-17 NOTE — Interval H&P Note (Signed)
History and Physical Interval Note:  03/17/2017 8:51 AM  Kelsey Hanna  has presented today for surgery, with the diagnosis of BACTEREMIA  The various methods of treatment have been discussed with the patient and family. After consideration of risks, benefits and other options for treatment, the patient has consented to  Procedure(s): TRANSESOPHAGEAL ECHOCARDIOGRAM (TEE) (N/A) as a surgical intervention .  The patient's history has been reviewed, patient examined, no change in status, stable for surgery.  I have reviewed the patient's chart and labs.  Questions were answered to the patient's satisfaction.     Ena Dawley

## 2017-03-17 NOTE — Progress Notes (Signed)
Peripherally Inserted Central Catheter/Midline Placement  The IV Nurse has discussed with the patient and/or persons authorized to consent for the patient, the purpose of this procedure and the potential benefits and risks involved with this procedure.  The benefits include less needle sticks, lab draws from the catheter, and the patient may be discharged home with the catheter. Risks include, but not limited to, infection, bleeding, blood clot (thrombus formation), and puncture of an artery; nerve damage and irregular heartbeat and possibility to perform a PICC exchange if needed/ordered by physician.  Alternatives to this procedure were also discussed.  Bard Power PICC patient education guide, fact sheet on infection prevention and patient information card has been provided to patient /or left at bedside.    PICC/Midline Placement Documentation  PICC Single Lumen 70/78/67 PICC Right Basilic 41 cm 0 cm (Active)  Indication for Insertion or Continuance of Line Home intravenous therapies (PICC only) 03/17/2017  4:26 PM  Exposed Catheter (cm) 0 cm 03/17/2017  4:26 PM  Site Assessment Clean;Dry;Intact 03/17/2017  4:26 PM  Line Status Flushed;Saline locked;Blood return noted 03/17/2017  4:26 PM  Dressing Type Transparent 03/17/2017  4:26 PM  Dressing Status Clean;Dry;Intact;Antimicrobial disc in place 03/17/2017  4:26 PM  Dressing Change Due 03/24/17 03/17/2017  4:26 PM       Adalea Handler, Nicolette Bang 03/17/2017, 4:27 PM

## 2017-03-17 NOTE — Progress Notes (Signed)
PHARMACY CONSULT NOTE FOR:  OUTPATIENT  PARENTERAL ANTIBIOTIC THERAPY (OPAT)  Indication: MRSA bacteremia Regimen: Vancomycin 1gm IV q12h End date: 03/27/17  IV antibiotic discharge orders are pended. To discharging provider:  please sign these orders via discharge navigator,  Select New Orders & click on the button choice - Manage This Unsigned Work.     Thank you for allowing pharmacy to be a part of this patient's care.  Elmer Ramp 03/17/2017, 4:26 PM

## 2017-03-17 NOTE — Progress Notes (Signed)
Per IV team, PICC may or may not be complete today. CM and MD aware.  Ave Filter, RN

## 2017-03-17 NOTE — Progress Notes (Signed)
    [place] for Infectious Disease    Date of Admission:  03/12/2017     ID: Kelsey Hanna is a 67 y.o. female who presented initially with slurred speech and difficulty walking. Acute CVA ruled out but found to be hypotensive and admitted for work up. Also s/p coiling of R ICA aneurysm. Blood cultures grew MRSA, likely incidental.   Antibiotics: 1/25 IV Vancomycin >>   Cultures: 1/24 Blood >> MRSA 1/26 Blood >> NG x 3 days  Subjective: Doing well this morning, tolerated TEE without complication. Eager for discharge.   Medications:  . aspirin  325 mg Oral Q breakfast  . atorvastatin  40 mg Oral QPM  . Chlorhexidine Gluconate Cloth  6 each Topical Q0600  . clonazePAM  0.5 mg Oral BID  . clopidogrel  75 mg Oral Q breakfast  . gabapentin  100 mg Oral BID  . levothyroxine  150 mcg Oral QAC breakfast  . linaclotide  145 mcg Oral QAC breakfast  . mupirocin ointment  1 application Nasal BID  . OLANZapine  20 mg Oral QHS    Objective: Vital signs in last 24 hours: Temp:  [98.1 F (36.7 C)-98.7 F (37.1 C)] 98.1 F (36.7 C) (01/29 1024) Pulse Rate:  [73-89] 75 (01/29 1024) Resp:  [16-25] 24 (01/29 0955) BP: (75-148)/(41-68) 134/44 (01/29 1024) SpO2:  [96 %-100 %] 100 % (01/29 1024)  Constitutional: Obese, NAD, appears comfortable HEENT: Atraumatic, normocephalic. PERRL, anicteric sclera.  Neck: Supple, trachea midline.  Cardiovascular: RRR, no murmurs, rubs, or gallops.  Pulmonary/Chest: CTAB, no wheezes, rales, or rhonchi. No chest wall abnormalities.  Abdominal: Distended due to habitus but soft, non tender,  +BS.  Extremities: Warm and well perfused. no edema.  Neurological: A&Ox3, CN II - XII grossly intact.  Skin: No rashes or erythema  Psychiatric: Normal mood and affect  Lab Results Recent Labs    03/17/17 0527  NA 142  K 4.2  CL 108  CO2 24  BUN 9  CREATININE 0.70   Assessment/Plan: TEE negative for vegetations. Will need 2 weeks of IV abx  from negative culture date - stop date Feb 8th.  Ok to discharge after PICC line. Please re consult with any questions.   [facility] Cell: [phone] Pager: [phone]  03/17/2017, 10:51 AM

## 2017-03-17 NOTE — Progress Notes (Addendum)
Patient returned from END for TEE. VSS. Denied pain. No other distress or concerns voice. Will continue to monitor.   Ave Filter, RN

## 2017-03-17 NOTE — Progress Notes (Signed)
Pharmacy Antibiotic Note  Kelsey Hanna is a 67 y.o. female admitted on 03/12/2017 with slurred speech. Now found to have MRSA bacteremia with negative TEE. She was briefly on linezolid PO due to line issues but now changing to vancomycin. The plan is for 2 weeks of IV antibiotics through Feb 8 and pull the picc line at the end of treatment.      VT today 16, within goal of 15-20. No changes to therapy. Renal function wnl.   Plan: Continue vancomycin 1g IV Q12h Monitor clinical picture and renal function Two weeks of IV antibiotics - stop date 03/27/2017  Height: 5\' 3"  (160 cm) Weight: 215 lb 13.3 oz (97.9 kg) IBW/kg (Calculated) : 52.4  Temp (24hrs), Avg:98.3 F (36.8 C), Min:98.1 F (36.7 C), Max:98.5 F (36.9 C)  Recent Labs  Lab 03/12/17 1324 03/12/17 1400 03/12/17 1412 03/13/17 0603 03/14/17 0423 03/17/17 0527 03/17/17 1132  WBC  --  8.6  --  7.9 13.4*  --   --   CREATININE 0.90 0.93  --  0.75 0.70 0.70  --   LATICACIDVEN  --   --  1.51  --   --   --   --   VANCOTROUGH  --   --   --   --   --   --  16    Estimated Creatinine Clearance: 75 mL/min (by C-G formula based on SCr of 0.7 mg/dL).    No Known Allergies   Leroy Libman, PharmD Pharmacy Resident Pager: [phone]

## 2017-03-17 NOTE — Progress Notes (Signed)
  Echocardiogram Echocardiogram Transesophageal has been performed.  Kelsey Hanna 03/17/2017, 10:11 AM

## 2017-03-17 NOTE — Progress Notes (Signed)
PROGRESS NOTE  ASJA FROMMER WRU:[id number] DOB: 06-Oct-1950 DOA: 03/12/2017 PCP: Patient, No Pcp Per   LOS: 4 days   Brief Narrative / Interim history: 67 y.o.female with history of depression/anxiety, hyperlipidemia, bipolar disorder, CHF, tobacco abuse, legal blindness, ICA aneurysm, and ganglionic stroke in Decemberpresented to the ED for the second time this month for stroke symptoms. Early January patient was found to have bilateral ICA aneurysms andplannedto have IR clipping procedure 1/23, but was unable as postop care was unarranged. 1/24 patient presented to the ED with slurred speech and difficulty walking. Patient denied dizziness, fever, chest pain, fever, chills,numbness/tingling. Code strokewasactivated.NoncontrastCTshowed no acute process.CT angiogram of the head and neck and a CT perfusion studyrevealed unchanged aneurysms.Neuro evaluated patient and she was admitted for hypotension.  1/25 IR coiling procedure performed.Blood cultures positive for MRSA, being treated.  Post IR procedure hematoma in right groin. US revealed no evidence of pseudoaneurysm or fistula. 2D echo 1/26 revealed LVEF 65-70% Grade 1 DD. TEE negative for endocarditis.     Assessment & Plan: Principal Problem:   Hypotension Active Problems:   Bipolar I disorder (HCC)   Essential hypertension   Hypothyroidism   HLD (hyperlipidemia)   Chronic back pain   OSA (obstructive sleep apnea)   Mitral regurgitation   Chronic diastolic CHF (congestive heart failure) (HCC)   Intracranial aneurysm   Slurred speech   Brain aneurysm   Internal carotid aneurysm   MRSA bacteremia  Bilateral Intracranial aneurysm Patient with history in stroke December 2018. Recent CT showed no acute changes 1/25 IR coiling procedure Patient stable, denies headache or trouble walking Post procedure right groin hematoma improving. [place] negative for pseudoaneurysm per IR Continue plavix and aspirin  MRSA  bacterima Unclear etiology Repeat Blood cultures negative TEE negative for endocarditis  PICC line ordered  continue IV vancomycin 2 weeks per ID Will discharge with HH  Hypotension Resolved. Likely due to bacteremia  Hyperlipidemia Continue statin  Chronic diastolic CHF Last Echo LVEF 65-70% mild LVH, Grade 1 DD Heart healthy diet continue home regimen  Chronic back pain Persistent pain in coccyx and back Continue PT Continue oxycodone     DVT prophylaxis: Aspirin, plavix Code Status:FULL Family Communication:none Disposition Plan: home  Consultants:   none  Procedures:   TEE  Antimicrobials:  Vanc IV  Subjective: Patient was alert sitting in bed. She is feeling much better today and ready to go home.   Objective: Vitals:   03/17/17 0942 03/17/17 0950 03/17/17 0955 03/17/17 1024  BP: (!) 108/47 (!) 108/52 (!) 107/42 (!) 134/44  Pulse: 79   75  Resp: (!) 25 20 (!) 24   Temp:  98.4 F (36.9 C)  98.1 F (36.7 C)  TempSrc:  Oral  Oral  SpO2: 100% 100% 100% 100%  Weight:      Height:        Intake/Output Summary (Last 24 hours) at 03/17/2017 1121 Last data filed at 03/17/2017 0800 Gross per 24 hour  Intake 320 ml  Output 420 ml  Net -100 ml   Filed Weights   03/13/17 0851 03/13/17 1627 03/15/17 2140  Weight: 90.7 kg (199 lb 16 oz) 92.1 kg (203 lb 0.7 oz) 97.9 kg (215 lb 13.3 oz)    Examination:  Constitutional: NAD Eyes: left eye legally blind, cannot open lid, conjunctivae normal ENMT: Mucous membranes are moist. No oropharyngeal exudates Neck: normal, suppl Respiratory: clear to auscultation bilaterally, no wheezing, no crackles. Normal respiratory effort. No accessory muscle use.  Cardiovascular:  Regular rate and rhythm, no murmurs / rubs / gallops. 1+ edema. 2+ pedal pulses.  Abdomen: no tenderness. Bowel sounds positive.  Musculoskeletal:No joint deformity upper and lower extremities. No contractures. Normal muscle tone.  Skin: large  right groin hematoma, + tenderness Neurologic: CN 2-12 grossly intact. Strength 5/5 in all 4.  Psychiatric: Normal judgment and insight. Alert and oriented x 3. Normal mood.    Data Reviewed: I have independently reviewed following labs and imaging studies   CBC: Recent Labs  Lab 03/12/17 1324 03/12/17 1400 03/13/17 0603 03/14/17 0423  WBC  --  8.6 7.9 13.4*  NEUTROABS  --  5.0  --  10.6*  HGB 13.6 11.3* 11.8* 10.2*  HCT 40.0 35.6* 36.8 30.5*  MCV  --  91.3 90.9 89.7  PLT  --  206 218 993   Basic Metabolic Panel: Recent Labs  Lab 03/12/17 1324 03/12/17 1400 03/13/17 0603 03/14/17 0423 03/17/17 0527  NA 140 136 141 140 142  K 4.4 4.1 4.4 4.1 4.2  CL 103 103 111 109 108  CO2  --  25 22 21* 24  GLUCOSE 100* 92 99 118* 121*  BUN 13 13 12 7 9   CREATININE 0.90 0.93 0.75 0.70 0.70  CALCIUM  --  8.3* 8.6* 8.6* 9.0   GFR: Estimated Creatinine Clearance: 75 mL/min (by C-G formula based on SCr of 0.7 mg/dL). Liver Function Tests: Recent Labs  Lab 03/12/17 1400  AST 18  ALT 17  ALKPHOS 55  BILITOT 0.4  PROT 5.1*  ALBUMIN 3.0*   No results for input(s): LIPASE, AMYLASE in the last 168 hours. No results for input(s): AMMONIA in the last 168 hours. Coagulation Profile: Recent Labs  Lab 03/12/17 1400  INR 1.10   Cardiac Enzymes: No results for input(s): CKTOTAL, CKMB, CKMBINDEX, TROPONINI in the last 168 hours. BNP (last 3 results) No results for input(s): PROBNP in the last 8760 hours. HbA1C: No results for input(s): HGBA1C in the last 72 hours. CBG: Recent Labs  Lab 03/13/17 0620 03/13/17 1357 03/13/17 2108  GLUCAP 106* 138* 117*   Lipid Profile: No results for input(s): CHOL, HDL, LDLCALC, TRIG, CHOLHDL, LDLDIRECT in the last 72 hours. Thyroid Function Tests: No results for input(s): TSH, T4TOTAL, FREET4, T3FREE, THYROIDAB in the last 72 hours. Anemia Panel: No results for input(s): VITAMINB12, FOLATE, FERRITIN, TIBC, IRON, RETICCTPCT in the last 72  hours. Urine analysis:    Component Value Date/Time   COLORURINE YELLOW 03/12/2017 1400   APPEARANCEUR HAZY (A) 03/12/2017 1400   LABSPEC 1.030 03/12/2017 1400   PHURINE 5.0 03/12/2017 1400   GLUCOSEU NEGATIVE 03/12/2017 1400   HGBUR NEGATIVE 03/12/2017 1400   BILIRUBINUR NEGATIVE 03/12/2017 1400   KETONESUR NEGATIVE 03/12/2017 1400   PROTEINUR NEGATIVE 03/12/2017 1400   UROBILINOGEN 0.2 10/20/2014 0814   NITRITE NEGATIVE 03/12/2017 1400   LEUKOCYTESUR NEGATIVE 03/12/2017 1400   Sepsis Labs: Invalid input(s): PROCALCITONIN, LACTICIDVEN  Recent Results (from the past 240 hour(s))  Blood culture (routine x 2)     Status: Abnormal   Collection Time: 03/12/17  2:35 PM  Result Value Ref Range Status   Specimen Description BLOOD LEFT ARM  Final   Special Requests   Final    BOTTLES DRAWN AEROBIC AND ANAEROBIC Blood Culture adequate volume   Culture  Setup Time   Final    GRAM POSITIVE COCCI IN CLUSTERS ANAEROBIC BOTTLE ONLY CRITICAL RESULT CALLED TO, READ BACK BY AND VERIFIED WITH: N. Batchelder Pharm.D. 12:25 03/13/17 ([email or web address])  Culture METHICILLIN RESISTANT STAPHYLOCOCCUS AUREUS (A)  Final   Report Status 03/15/2017 FINAL  Final   Organism ID, Bacteria METHICILLIN RESISTANT STAPHYLOCOCCUS AUREUS  Final      Susceptibility   Methicillin resistant staphylococcus aureus - MIC*    CIPROFLOXACIN >=8 RESISTANT Resistant     ERYTHROMYCIN >=8 RESISTANT Resistant     GENTAMICIN <=0.5 SENSITIVE Sensitive     OXACILLIN RESISTANT Resistant     TETRACYCLINE <=1 SENSITIVE Sensitive     VANCOMYCIN 1 SENSITIVE Sensitive     TRIMETH/SULFA <=10 SENSITIVE Sensitive     CLINDAMYCIN >=8 RESISTANT Resistant     RIFAMPIN <=0.5 SENSITIVE Sensitive     Inducible Clindamycin NEGATIVE Sensitive     * METHICILLIN RESISTANT STAPHYLOCOCCUS AUREUS  Blood Culture ID Panel (Reflexed)     Status: Abnormal   Collection Time: 03/12/17  2:35 PM  Result Value Ref Range Status   Enterococcus species NOT  DETECTED NOT DETECTED Final   Listeria monocytogenes NOT DETECTED NOT DETECTED Final   Staphylococcus species DETECTED (A) NOT DETECTED Final    Comment: CRITICAL RESULT CALLED TO, READ BACK BY AND VERIFIED WITH: N. Batchelder Pharm.D. 12:25 03/13/17 ([email or web address])    Staphylococcus aureus DETECTED (A) NOT DETECTED Final    Comment: Methicillin (oxacillin)-resistant Staphylococcus aureus (MRSA). MRSA is predictably resistant to beta-lactam antibiotics (except ceftaroline). Preferred therapy is vancomycin unless clinically contraindicated. Patient requires contact precautions if  hospitalized. CRITICAL RESULT CALLED TO, READ BACK BY AND VERIFIED WITH: N. Batchelder Pharm.D. 12:25 03/13/17 ([email or web address])    Methicillin resistance DETECTED (A) NOT DETECTED Final    Comment: CRITICAL RESULT CALLED TO, READ BACK BY AND VERIFIED WITH: N. Batchelder Pharm.D. 12:25 03/13/17 ([email or web address])    Streptococcus species NOT DETECTED NOT DETECTED Final   Streptococcus agalactiae NOT DETECTED NOT DETECTED Final   Streptococcus pneumoniae NOT DETECTED NOT DETECTED Final   Streptococcus pyogenes NOT DETECTED NOT DETECTED Final   Acinetobacter baumannii NOT DETECTED NOT DETECTED Final   Enterobacteriaceae species NOT DETECTED NOT DETECTED Final   Enterobacter cloacae complex NOT DETECTED NOT DETECTED Final   Escherichia coli NOT DETECTED NOT DETECTED Final   Klebsiella oxytoca NOT DETECTED NOT DETECTED Final   Klebsiella pneumoniae NOT DETECTED NOT DETECTED Final   Proteus species NOT DETECTED NOT DETECTED Final   Serratia marcescens NOT DETECTED NOT DETECTED Final   Haemophilus influenzae NOT DETECTED NOT DETECTED Final   Neisseria meningitidis NOT DETECTED NOT DETECTED Final   Pseudomonas aeruginosa NOT DETECTED NOT DETECTED Final   Candida albicans NOT DETECTED NOT DETECTED Final   Candida glabrata NOT DETECTED NOT DETECTED Final   Candida krusei NOT DETECTED NOT DETECTED Final   Candida parapsilosis NOT DETECTED NOT  DETECTED Final   Candida tropicalis NOT DETECTED NOT DETECTED Final  Blood culture (routine x 2)     Status: None   Collection Time: 03/12/17  2:45 PM  Result Value Ref Range Status   Specimen Description BLOOD RIGHT HAND  Final   Special Requests   Final    BOTTLES DRAWN AEROBIC AND ANAEROBIC Blood Culture adequate volume   Culture NO GROWTH 5 DAYS  Final   Report Status 03/17/2017 FINAL  Final  MRSA PCR Screening     Status: Abnormal   Collection Time: 03/13/17  4:29 PM  Result Value Ref Range Status   MRSA by PCR POSITIVE (A) NEGATIVE Final    Comment:        The GeneXpert MRSA Assay (FDA approved for NASAL specimens only), is one  component of a comprehensive MRSA colonization surveillance program. It is not intended to diagnose MRSA infection nor to guide or monitor treatment for MRSA infections. CRITICAL RESULT CALLED TO, READ BACK BY AND VERIFIED WITH: Melburn Hake, RN AT 2015 ON 03/13/17 BY C. JESSUP, MLT.   Culture, blood (Routine X 2) w Reflex to ID Panel     Status: None (Preliminary result)   Collection Time: 03/14/17 11:41 AM  Result Value Ref Range Status   Specimen Description BLOOD LEFT FOREARM  Final   Special Requests IN PEDIATRIC BOTTLE Blood Culture adequate volume  Final   Culture NO GROWTH 3 DAYS  Final   Report Status PENDING  Incomplete  Culture, blood (Routine X 2) w Reflex to ID Panel     Status: None (Preliminary result)   Collection Time: 03/14/17 11:41 AM  Result Value Ref Range Status   Specimen Description BLOOD LEFT ANTECUBITAL  Final   Special Requests IN PEDIATRIC BOTTLE Blood Culture adequate volume  Final   Culture NO GROWTH 3 DAYS  Final   Report Status PENDING  Incomplete      Radiology Studies: No results found.   Scheduled Meds: . aspirin  325 mg Oral Q breakfast  . atorvastatin  40 mg Oral QPM  . Chlorhexidine Gluconate Cloth  6 each Topical Q0600  . clonazePAM  0.5 mg Oral BID  . clopidogrel  75 mg Oral Q breakfast  .  gabapentin  100 mg Oral BID  . levothyroxine  150 mcg Oral QAC breakfast  . linaclotide  145 mcg Oral QAC breakfast  . mupirocin ointment  1 application Nasal BID  . OLANZapine  20 mg Oral QHS   Continuous Infusions: . niCARDipine    . vancomycin Stopped (03/17/17 0100)       Time spent:    Neamiah Sciarra. PA-S

## 2017-03-17 NOTE — Care Management Important Message (Signed)
Important Message  Patient Details  Name: Kelsey Hanna MRN: [id number] Date of Birth: 05-27-1950   Medicare Important Message Given:  Yes    Carles Collet, RN 03/17/2017, 1:42 PM

## 2017-03-17 NOTE — CV Procedure (Signed)
     Transesophageal Echocardiogram Note  DAIJANAE RAFALSKI [id number] April 05, 1950  Procedure: Transesophageal Echocardiogram Indications: MRSA Bacteremia  Procedure Details Consent: Obtained Time Out: Verified patient identification, verified procedure, site/side was marked, verified correct patient position, special equipment/implants available, Radiology Safety Procedures followed,  medications/allergies/relevent history reviewed, required imaging and test results available.  Performed  Medications: During this procedure the patient is administered a total of Versed 50 mcg and Fentanyl 4  mg to achieve and maintain moderate conscious sedation.  The patient's heart rate, blood pressure, and oxygen saturation are monitored continuously during the procedure. The period of conscious sedation is 30 minutes, of which I was present face-to-face 100% of this time.  No vegetation was seen, see TEE report for full dictation.    Complications: No apparent complications Patient did tolerate procedure well.  Ena Dawley, MD, [facility] [place] 03/17/2017, 10:03 AM

## 2017-03-17 NOTE — Progress Notes (Signed)
Physical Therapy Treatment Patient Details Name: Kelsey Hanna MRN: [id number] DOB: [birth year] Today's Date: 03/17/2017    History of Present Illness 67 y.o. female admitted on 03/12/17 for slurred speech and difficulty walking.  She was found to have hypotension, bil ICA aneurysms and IR consulted. 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  Pt with significant PMH of DM, CA, bipolar d/o, and lumbar laminectomy and decompression.    PT Comments    Pt is making slow progress towards her goals, however is limited in her safe mobility by dizziness with positional change and fatigue. Pt educated on need to move cautiously with positional change and wait until symptoms resolve before moving. Pt currently min guard for bed mobility, transfers, ambulation of 80 feet with RW. D/c plans remain appropriate at this time. PT will continue to follow acutely until d/c.    Follow Up Recommendations  Home health PT;Supervision - Intermittent     Equipment Recommendations  None recommended by PT    Recommendations for Other Services       Precautions / Restrictions Precautions Precautions: Fall Precaution Comments: poor vision at baseline, legally blind Restrictions Weight Bearing Restrictions: No    Mobility  Bed Mobility Overal bed mobility: Needs Assistance Bed Mobility: Supine to Sit     Supine to sit: HOB elevated;Min guard     General bed mobility comments: min guard for safety, pt was able to utilize bedrails to pull to EoB, c/o of dizziness that quickly dissapated after coming to sitting  Transfers Overall transfer level: Needs assistance Equipment used: Rolling walker (2 wheeled) Transfers: Sit to/from Stand Sit to Stand: Min guard         General transfer comment: min guard for safety, good power up and steadying, c/o of dizziness with standing which quickly dissapated  Ambulation/Gait Ambulation/Gait  assistance: Min guard Ambulation Distance (Feet): 80 Feet Assistive device: Rolling walker (2 wheeled) Gait Pattern/deviations: Step-to pattern;Shuffle;Decreased stride length Gait velocity: decreased Gait velocity interpretation: Below normal speed for age/gender General Gait Details: hands on min guard for safety, vc for RW management due to decreased vision, reported feeling weak after ambulation of 40 feet, returned to room, pt reported feeling dizzy as she entered the room      Balance Overall balance assessment: Needs assistance Sitting-balance support: Feet supported;Bilateral upper extremity supported Sitting balance-Leahy Scale: Fair     Standing balance support: Bilateral upper extremity supported Standing balance-Leahy Scale: Fair                              Cognition Arousal/Alertness: Awake/alert Behavior During Therapy: WFL for tasks assessed/performed Overall Cognitive Status: Within Functional Limits for tasks assessed                                           General Comments General comments (skin integrity, edema, etc.): after ambulation pt used BSC and was returned to recliner to eat lunch      Pertinent Vitals/Pain Pain Assessment: Faces Faces Pain Scale: Hurts a little bit Pain Descriptors / Indicators: Grimacing Pain Intervention(s): Limited activity within patient's tolerance;Monitored during session;Repositioned           PT Goals (current goals can now be found in the care plan section) Acute Rehab PT Goals Patient Stated Goal: to get  back to her normal level of function PT Goal Formulation: With patient Time For Goal Achievement: 03/29/17 Potential to Achieve Goals: Fair Progress towards PT goals: Progressing toward goals    Frequency    Min 3X/week      PT Plan Current plan remains appropriate       AM-PAC PT "6 Clicks" Daily Activity  Outcome Measure  Difficulty turning over in bed (including  adjusting bedclothes, sheets and blankets)?: A Lot Difficulty moving from lying on back to sitting on the side of the bed? : A Lot Difficulty sitting down on and standing up from a chair with arms (e.g., wheelchair, bedside commode, etc,.)?: A Lot Help needed moving to and from a bed to chair (including a wheelchair)?: A Little Help needed walking in hospital room?: A Little Help needed climbing 3-5 steps with a railing? : A Lot 6 Click Score: 14    End of Session Equipment Utilized During Treatment: Gait belt Activity Tolerance: Patient limited by fatigue(limited by weakness from NPO/ dizziness) Patient left: in chair;with call bell/phone within reach Nurse Communication: Mobility status;Other (comment) PT Visit Diagnosis: Unsteadiness on feet (R26.81);Difficulty in walking, not elsewhere classified (R26.2);Pain Pain - Right/Left: Right Pain - part of body: Hip     Time: 9794-8016 PT Time Calculation (min) (ACUTE ONLY): 22 min  Charges:  $Gait Training: 8-22 mins                    G Codes:       Hallee Mckenny B. Migdalia Dk PT, DPT Acute Rehabilitation  [phone] Pager [phone]    [place] 03/17/2017, 2:19 PM

## 2017-03-18 ENCOUNTER — Encounter (HOSPITAL_COMMUNITY): Payer: Self-pay | Admitting: Cardiology

## 2017-03-18 DIAGNOSIS — E039 Hypothyroidism, unspecified: Secondary | ICD-10-CM | POA: Diagnosis not present

## 2017-03-18 DIAGNOSIS — R278 Other lack of coordination: Secondary | ICD-10-CM | POA: Diagnosis not present

## 2017-03-18 DIAGNOSIS — I10 Essential (primary) hypertension: Secondary | ICD-10-CM | POA: Diagnosis not present

## 2017-03-18 DIAGNOSIS — B9562 Methicillin resistant Staphylococcus aureus infection as the cause of diseases classified elsewhere: Secondary | ICD-10-CM | POA: Diagnosis not present

## 2017-03-18 DIAGNOSIS — I671 Cerebral aneurysm, nonruptured: Secondary | ICD-10-CM | POA: Diagnosis not present

## 2017-03-18 DIAGNOSIS — R2689 Other abnormalities of gait and mobility: Secondary | ICD-10-CM | POA: Diagnosis not present

## 2017-03-18 DIAGNOSIS — I5032 Chronic diastolic (congestive) heart failure: Secondary | ICD-10-CM | POA: Diagnosis not present

## 2017-03-18 DIAGNOSIS — Z8673 Personal history of transient ischemic attack (TIA), and cerebral infarction without residual deficits: Secondary | ICD-10-CM | POA: Diagnosis not present

## 2017-03-18 DIAGNOSIS — E038 Other specified hypothyroidism: Secondary | ICD-10-CM

## 2017-03-18 DIAGNOSIS — M533 Sacrococcygeal disorders, not elsewhere classified: Secondary | ICD-10-CM | POA: Diagnosis not present

## 2017-03-18 DIAGNOSIS — R2681 Unsteadiness on feet: Secondary | ICD-10-CM | POA: Diagnosis not present

## 2017-03-18 DIAGNOSIS — M6281 Muscle weakness (generalized): Secondary | ICD-10-CM | POA: Diagnosis not present

## 2017-03-18 DIAGNOSIS — G4733 Obstructive sleep apnea (adult) (pediatric): Secondary | ICD-10-CM | POA: Diagnosis not present

## 2017-03-18 DIAGNOSIS — G464 Cerebellar stroke syndrome: Secondary | ICD-10-CM | POA: Diagnosis not present

## 2017-03-18 DIAGNOSIS — R7881 Bacteremia: Secondary | ICD-10-CM | POA: Diagnosis not present

## 2017-03-18 DIAGNOSIS — E7849 Other hyperlipidemia: Secondary | ICD-10-CM | POA: Diagnosis not present

## 2017-03-18 DIAGNOSIS — E119 Type 2 diabetes mellitus without complications: Secondary | ICD-10-CM | POA: Diagnosis not present

## 2017-03-18 DIAGNOSIS — M549 Dorsalgia, unspecified: Secondary | ICD-10-CM | POA: Diagnosis not present

## 2017-03-18 DIAGNOSIS — Z9889 Other specified postprocedural states: Secondary | ICD-10-CM | POA: Diagnosis not present

## 2017-03-18 MED ORDER — OXYCODONE HCL 10 MG PO TABS: 10.0000 mg | ORAL_TABLET | Freq: Four times a day (QID) | ORAL | 0 refills | Status: DC | PRN

## 2017-03-18 MED ORDER — CLONAZEPAM 0.5 MG PO TABS: 0.5000 mg | ORAL_TABLET | Freq: Two times a day (BID) | ORAL | 0 refills | Status: DC

## 2017-03-18 MED ORDER — HEPARIN SOD (PORK) LOCK FLUSH 100 UNIT/ML IV SOLN
250.0000 [IU] | INTRAVENOUS | Status: AC | PRN
Start: 2017-03-18 — End: 2017-03-18
  Administered 2017-03-18: 250 [IU]

## 2017-03-18 MED ORDER — TIZANIDINE HCL 4 MG PO TABS: 4.0000 mg | ORAL_TABLET | Freq: Three times a day (TID) | ORAL | 0 refills | Status: DC | PRN

## 2017-03-18 NOTE — NC FL2 (Signed)
[place] LEVEL OF CARE SCREENING TOOL     IDENTIFICATION  Patient Name: Kelsey Hanna Birthdate: 11-08-1950 Sex: female Admission Date (Current Location): 03/12/2017  [facility] and [place] Number:  Herbalist and Address:  [facility] [place]. [facility], [place] [address] [place], [place] [address]      Provider Number: [id number]  Attending Physician Name and Address:  Velvet Bathe, MD  Relative Name and Phone Number:       Current Level of Care: Hospital Recommended Level of Care: [place] Prior Approval Number:    Date Approved/Denied:   PASRR Number: [id number]  Discharge Plan: SNF    Current Diagnoses: Patient Active Problem List   Diagnosis Date Noted  . MRSA bacteremia   . Brain aneurysm 03/13/2017  . Internal carotid aneurysm   . Slurred speech 03/12/2017  . Hypotension 03/12/2017  . Dysarthria   . Left sided numbness   . Acute left-sided muscle weakness 01/24/2017  . Stroke (cerebrum) ([place]) 01/24/2017  . Intracranial aneurysm 01/24/2017  . Encounter for medication review 02/07/2016  . Hypoxia, sleep related 09/07/2015  . Constipation 07/30/2015  . Dizziness 07/30/2015  . Chronic diastolic CHF (congestive heart failure) ([place]) 06/26/2015  . Bilateral lower extremity edema 06/26/2015  . Mitral regurgitation 05/17/2015  . Accidental drug overdose 05/17/2015  . Respiratory failure with hypoxia and hypercapnia ([place]) 05/09/2015  . OSA (obstructive sleep apnea) 05/09/2015  . Overdose 05/09/2015  . Chronic back pain 03/20/2015  . Right shoulder pain 11/14/2014  . Insomnia 11/14/2014  . Psychoses ([place])   . Bacteremia   . Pedal edema 09/12/2014  . Bipolar disorder, current episode manic without psychotic features, severe ([place])   . Bipolar disorder ([place]) 09/05/2014  . Bipolar affective disorder, current episode manic with psychotic symptoms ([place]) 09/05/2014  . Bipolar affective disorder, manic ([place]) 09/03/2014   . Encounter for preadmission testing   . Head revolving around 07/27/2014  . BP (high blood pressure) 05/23/2014  . AF (paroxysmal atrial fibrillation) ([place]) 05/23/2014  . Type 2 diabetes mellitus ([place]) 04/27/2014  . Essential hypertension 04/27/2014  . Hypothyroidism 04/27/2014  . Tobacco abuse 04/27/2014  . Bipolar I disorder ([place]) 04/20/2014  . Acute encephalopathy 04/18/2014  . Acute respiratory failure ([place])   . Encounter for feeding tube placement   . Shortness of breath   . Gout 08/25/2013  . 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 HLD (hyperlipidemia) 08/30/2009    Orientation RESPIRATION BLADDER Height & Weight     Self, Time, Situation  Normal Continent Weight: 215 lb 13.3 oz (97.9 kg) Height:  5\' 3"  (160 cm)  BEHAVIORAL SYMPTOMS/MOOD NEUROLOGICAL BOWEL NUTRITION STATUS      Continent Diet(regular)  AMBULATORY STATUS COMMUNICATION OF NEEDS Skin   Limited Assist Verbally Surgical wounds                       Personal Care Assistance Level of Assistance  Bathing, Dressing Bathing Assistance: Limited assistance   Dressing Assistance: Limited assistance     Functional Limitations Info             SPECIAL CARE FACTORS FREQUENCY  PT (By licensed PT), OT (By licensed OT)     PT Frequency: 5/wk OT Frequency: 5/wk            Contractures      Additional Factors Info  Code Status, Allergies, Isolation Precautions Code Status Info: FULL Allergies Info: NKA  Isolation Precautions Info: MRSA     Current Medications (03/18/2017):  This is the current hospital active medication list Current Facility-Administered Medications  Medication Dose Route Frequency Provider Last Rate Last Dose  . acetaminophen (TYLENOL) tablet 650 mg  650 mg Oral Q4H PRN Luanne Bras, MD   650 mg at 03/17/17 1733   Or  . acetaminophen (TYLENOL) solution 650 mg  650 mg Per Tube Q4H PRN Deveshwar, Willaim Rayas, MD       Or  . acetaminophen (TYLENOL) suppository 650 mg  650  mg Rectal Q4H PRN Deveshwar, Sanjeev, MD      . aspirin tablet 325 mg  325 mg Oral Q breakfast Luanne Bras, MD   325 mg at 03/18/17 0853  . atorvastatin (LIPITOR) tablet 40 mg  40 mg Oral QPM Radene Gunning, NP   40 mg at 03/17/17 1733  . Chlorhexidine Gluconate Cloth 2 % PADS 6 each  6 each Topical Q0600 Luanne Bras, MD   6 each at 03/17/17 0602  . clonazePAM (KLONOPIN) tablet 0.5 mg  0.5 mg Oral BID Radene Gunning, NP   0.5 mg at 03/18/17 0850  . clopidogrel (PLAVIX) tablet 75 mg  75 mg Oral Q breakfast Luanne Bras, MD   75 mg at 03/18/17 0850  . docusate sodium (COLACE) capsule 100 mg  100 mg Oral Daily PRN Radene Gunning, NP   100 mg at 03/15/17 1223  . gabapentin (NEURONTIN) capsule 100 mg  100 mg Oral BID Radene Gunning, NP   100 mg at 03/18/17 0850  . hydrALAZINE (APRESOLINE) injection 5 mg  5 mg Intravenous Q20 Min PRN Deveshwar, Sanjeev, MD      . ketorolac (TORADOL) 15 MG/ML injection 15 mg  15 mg Intravenous Q8H PRN Patrecia Pour, Christean Grief, MD      . levothyroxine (SYNTHROID, LEVOTHROID) tablet 150 mcg  150 mcg Oral QAC breakfast Radene Gunning, NP   150 mcg at 03/18/17 0850  . linaclotide (LINZESS) capsule 145 mcg  145 mcg Oral QAC breakfast Radene Gunning, NP   145 mcg at 03/18/17 0850  . mupirocin ointment (BACTROBAN) 2 % 1 application  1 application Nasal BID Luanne Bras, MD   1 application at 71/24/58 0850  . OLANZapine (ZYPREXA) tablet 20 mg  20 mg Oral QHS Radene Gunning, NP   20 mg at 03/17/17 2350  . ondansetron (ZOFRAN) tablet 4 mg  4 mg Oral Q6H PRN Radene Gunning, NP       Or  . ondansetron [facility]) injection 4 mg  4 mg Intravenous Q6H PRN Radene Gunning, NP      . oxyCODONE (Oxy IR/ROXICODONE) immediate release tablet 10 mg  10 mg Oral Q6H PRN Patrecia Pour, Christean Grief, MD   10 mg at 03/18/17 1355  . sodium chloride flush (NS) 0.9 % injection 10-40 mL  10-40 mL Intracatheter PRN Patrecia Pour, Christean Grief, MD   10 mL at 03/18/17 0830  . tiZANidine (ZANAFLEX)  tablet 4 mg  4 mg Oral Q8H PRN Radene Gunning, NP   4 mg at 03/17/17 2118  . vancomycin (VANCOCIN) IVPB 1000 mg/200 mL premix  1,000 mg Intravenous Q12H Rumbarger, Valeda Malm, RPH   Stopped at 03/18/17 1000  . zolpidem (AMBIEN) tablet 5 mg  5 mg Oral QHS PRN Schorr, Rhetta Mura, NP   5 mg at 03/17/17 2302     Discharge Medications: Please see discharge summary for a list of discharge medications.  Relevant Imaging Results:  Relevant Lab Results:   Additional Information SS#: [id number]; will need IV vanc for 2 weeks  Wende Neighbors, LCSW

## 2017-03-18 NOTE — Discharge Summary (Addendum)
Physician Discharge Summary  Kelsey Hanna KGU:[id number] DOB: [birth year] DOA: 03/12/2017  PCP: Patient, No Pcp Per  Admit date: 03/12/2017 Discharge date: 03/18/2017  Time spent: > 35 minutes  Recommendations for Outpatient Follow-up:  1. Pt to f/u with pcp at facility   Discharge Diagnoses:  Principal Problem:   Hypotension Active Problems:   Bipolar I disorder ([place])   Essential hypertension   Hypothyroidism   HLD (hyperlipidemia)   Chronic back pain   OSA (obstructive sleep apnea)   Mitral regurgitation   Chronic diastolic CHF (congestive heart failure) ([place])   Intracranial aneurysm   Slurred speech   Brain aneurysm   Internal carotid aneurysm   MRSA bacteremia   Discharge Condition: stable  Diet recommendation: low sodium diet.  Filed Weights   03/13/17 0851 03/13/17 1627 03/15/17 2140  Weight: 90.7 kg (199 lb 16 oz) 92.1 kg (203 lb 0.7 oz) 97.9 kg (215 lb 13.3 oz)    History of present illness:   67 year old female with past medical history of hypertension, hypothyroidism, OSA, diastolic CHF, type 2 diabetes mellitus, PAF, and bilateral ICA aneurysm. Patient presented to the emergency department complaining of slurred speech and difficulty walking. Code stroke was activated and neurology evaluate patient. CT of the head was negative for acute process and CT perfusion study shows stable aneurysm.    Early January patient was found to have bilateral ICA aneurysms andplannedto have IR clipping procedure 1/23, but was unable as postop care was unarranged. 1/24 patient presented to the ED with slurred speech and difficulty walking. Patient denied dizziness, fever, chest pain, fever, chills,numbness/tingling. Code strokewasactivated.NoncontrastCTshowed no acute process.CT angiogram of the head and neck and a CT perfusion studyrevealed unchanged aneurysms.Neuro evaluated patient and she was admitted for hypotension.  1/25 IR coiling procedure performed.Blood  cultures positive for MRSA, being treated.  PostIRprocedure hematoma in right groin. US revealed no evidence of pseudoaneurysm or fistula. 2D echo 1/26 revealed LVEF 65-70% Grade 1 DD. TEE negative for endocarditis.    Hospital Course:  Bilateral Intracranial aneurysm Patient with history in stroke December 2018. Recent CT showed no acute changes 1/25 IR coiling procedure Patient stable, denies headache or trouble walking Post procedure right groin hematoma improving. [place] negative for pseudoaneurysm per IR Continue plavix and aspirin  MRSA bacterima Unclear etiology Repeat Blood cultures negative TEE negative for endocarditis PICC line ordered  continue IV vancomycin 2 weeks per ID Pt to be d/c to SNF as she is unable to give herself the antibiotic treatment and poor social support.  Hypotension Resolved. Likely due to bacteremia  Hyperlipidemia Continue statin  Chronic diastolic CHF Last Echo LVEF 65-70% mild LVH, Grade 1 DD continue home regimen  Chronic back pain Persistent pain in coccyx and back Continue PT Continue oxycodone    Procedures:  none  Consultations:  IR  Neuro  Discharge Exam: Vitals:   03/18/17 1000 03/18/17 1244  BP: (!) 168/75 (!) 146/70  Pulse: 87 76  Resp: 17 18  Temp: (!) 97.4 F (36.3 C) 97.7 F (36.5 C)  SpO2: 96% 100%    General: Pt in nad, alert and awake Cardiovascular: rrr, no rubs Respiratory: no increases wob, no wheezes  Discharge Instructions   Discharge Instructions    Call MD for:  difficulty breathing, headache or visual disturbances   Complete by:  As directed    Call MD for:  extreme fatigue   Complete by:  As directed    Call MD for:  hives   Complete  by:  As directed    Call MD for:  persistant dizziness or light-headedness   Complete by:  As directed    Call MD for:  persistant nausea and vomiting   Complete by:  As directed    Call MD for:  redness, tenderness, or signs of infection (pain,  swelling, redness, odor or green/yellow discharge around incision site)   Complete by:  As directed    Call MD for:  severe uncontrolled pain   Complete by:  As directed    Call MD for:  temperature >100.4   Complete by:  As directed    Diet - low sodium heart healthy   Complete by:  As directed    Diet - low sodium heart healthy   Complete by:  As directed    Home infusion instructions [place] May follow [place] Dosing Protocol; May administer Cathflo as needed to maintain patency of vascular access device.; [place] of vascular access device: per [facility] Protocol: 0.9% NaCl pre/post medica...   Complete by:  As directed    Instructions:  May follow [place] Dosing Protocol   Instructions:  May administer Cathflo as needed to maintain patency of vascular access device.   Instructions:  [place] of vascular access device: per [facility] Protocol: 0.9% NaCl pre/post medication administration and prn patency; Heparin 100 u/ml, 24m for implanted ports and Heparin 10u/ml, 561mfor all other central venous catheters.   Instructions:  May follow [facility] Anaphylaxis Protocol for First Dose Administration in the home: 0.9% NaCl at 25-50 ml/hr to maintain IV access for protocol meds. Epinephrine 0.3 ml IV/IM PRN and Benadryl 25-50 IV/IM PRN s/s of anaphylaxis.   Instructions:  AdLamontnfusion Coordinator (RN) to assist per patient IV care needs in the home PRN.   Increase activity slowly   Complete by:  As directed    Increase activity slowly   Complete by:  As directed       No Known Allergies Contact information for after-discharge care    Destination    [facility] .   Service:  Skilled Nursing Contact information: [phone]             The results of significant diagnostics from this hospitalization (including imaging, microbiology, ancillary and laboratory) are listed below for reference.     Significant Diagnostic Studies: Ct Angio Head W Or Wo Contrast  Result Date: 03/12/2017 CLINICAL DATA:  89ear old female with abnormal speech and bilateral weakness. Bilateral ICA paraophthalmic aneurysms. EXAM: CT ANGIOGRAPHY HEAD AND NECK CT PERFUSION BRAIN TECHNIQUE: Multidetector CT imaging of the head and neck was performed using the standard protocol during bolus administration of intravenous contrast. Multiplanar CT image reconstructions and MIPs were obtained to evaluate the vascular anatomy. Carotid stenosis measurements (when applicable) are obtained utilizing NASCET criteria, using the distal internal carotid diameter as the denominator. Multiphase CT imaging of the brain was performed following IV bolus contrast injection. Subsequent parametric perfusion maps were calculated using RAPID software. CONTRAST:  100 milliliters Isovue 370 COMPARISON:  Head CT without contrast 1338 hr today. FINDINGS: CT Brain Perfusion Findings: CBF (<30%) Volume: 0 milliliters Perfusion (Tmax>6.0s) volume: 0 milliliters Mismatch Volume: Not applicablemL Infarction Location:Not applicable CTA NECK Skeleton: Absent dentition. Cervical spine degeneration. Stable visualized osseous structures. Upper chest: Patchy and dependent pulmonary opacity in the upper lungs is similar to that in December and may reflect a combination of chronic lung disease  and atelectasis. No superior mediastinal lymphadenopathy. Other neck: Negative aside from a partially retropharyngeal course of both carotid arteries. No cervical lymphadenopathy. Aortic arch: 3 vessel arch configuration. Stable mild to moderate arch and great vessel origin calcified plaque. Right carotid system: Mildly increased soft plaque in the ventral brachiocephalic artery since December, or might have been obscured by artifact previously. No significant brachiocephalic artery stenosis. No right CCA origin stenosis. Tortuous proximal right CCA. Mild plaque in the right  CCA and at the right carotid bifurcation without stenosis. Partially retropharyngeal but otherwise negative cervical right ICA. Left carotid system: Stable calcified plaque at the left CCA origin without stenosis. Intermittent mild plaque in the left CCA. Minimal calcified plaque at the left ICA origin and bulb without stenosis. Mildly tortuous cervical left ICA with a partially retropharyngeal course. Vertebral arteries: Stable tortuosity of the proximal right subclavian artery with a kinked appearance before the right vertebral artery origin. Stable calcified plaque at the right vertebral artery origin with mild stenosis. The right vertebral artery is patent to the skull base without additional stenosis. No proximal left subclavian artery stenosis despite some soft plaque. Normal left vertebral artery origin. Tortuous left V1 segment with a mildly kinked appearance. Patent left vertebral artery to the skull base without additional stenosis. CTA HEAD Posterior circulation: Codominant distal vertebral arteries. Patent left PICA origin. The right AICA appears dominant. Patent vertebrobasilar junction. Patent basilar artery without stenosis. Normal SCA and left PCA origins. Fetal type right PCA origin. Bilateral PCA branches are within normal limits. Anterior circulation: Both ICA siphons are patent. Mild siphon plaque. No siphon stenosis. 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 This appears to arise from the undersurface of the left supraclinoid segment (paraophthalmic). 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 This aneurysm might incorporate the origin of the right ophthalmic artery, uncertain. Carotid termini are stable and patent. Dominant left ACA A1 segment, the right A1 is diminutive. MCA and  ACA origins are normal. Anterior communicating artery and bilateral ACA branches are within normal limits. Left MCA M1 segment, left MCA bifurcation, and left MCA branches are stable and within normal limits. Right MCA M1 segment, bifurcation, and right MCA branches are stable and within normal limits. Venous sinuses: Patent. Anatomic variants: Dominant left and diminutive right ACA A1 segments. Fetal type right PCA origin. Review of the MIP images confirms the above findings IMPRESSION: 1. Negative for emergent large vessel occlusion. No core infarct or penumbra detected by CTP. Results of #1 were communicated to Dr. Rory Percy at 1411 hr on 1/24/2019by text page via the [facility] messaging system. 2. 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 3. Up to moderate arch and great vessel origin atherosclerosis, but otherwise mild for age atherosclerosis in the head and neck. No significant arterial stenosis identified. 4. Nonspecific patchy opacity in both upper lungs appears similar to that on 01/24/17, perhaps a combination of atelectasis and chronic lung disease. Electronically Signed   By: Genevie Ann M.D.   On: 03/12/2017 14:24   Ct Angio Neck W Or Wo Contrast  Result Date: 03/12/2017 CLINICAL DATA:  67 year old female with abnormal speech and bilateral weakness. Bilateral  ICA paraophthalmic aneurysms. EXAM: CT ANGIOGRAPHY HEAD AND NECK CT PERFUSION BRAIN TECHNIQUE: Multidetector CT imaging of the head and neck was performed using the standard protocol during bolus administration of intravenous contrast. Multiplanar CT image reconstructions and MIPs were obtained to evaluate the vascular anatomy. Carotid stenosis measurements (when applicable) are obtained utilizing NASCET criteria, using the distal internal carotid diameter as the denominator. Multiphase CT imaging of the brain was  performed following IV bolus contrast injection. Subsequent parametric perfusion maps were calculated using RAPID software. CONTRAST:  100 milliliters Isovue 370 COMPARISON:  Head CT without contrast 1338 hr today. FINDINGS: CT Brain Perfusion Findings: CBF (<30%) Volume: 0 milliliters Perfusion (Tmax>6.0s) volume: 0 milliliters Mismatch Volume: Not applicablemL Infarction Location:Not applicable CTA NECK Skeleton: Absent dentition. Cervical spine degeneration. Stable visualized osseous structures. Upper chest: Patchy and dependent pulmonary opacity in the upper lungs is similar to that in December and may reflect a combination of chronic lung disease and atelectasis. No superior mediastinal lymphadenopathy. Other neck: Negative aside from a partially retropharyngeal course of both carotid arteries. No cervical lymphadenopathy. Aortic arch: 3 vessel arch configuration. Stable mild to moderate arch and great vessel origin calcified plaque. Right carotid system: Mildly increased soft plaque in the ventral brachiocephalic artery since December, or might have been obscured by artifact previously. No significant brachiocephalic artery stenosis. No right CCA origin stenosis. Tortuous proximal right CCA. Mild plaque in the right CCA and at the right carotid bifurcation without stenosis. Partially retropharyngeal but otherwise negative cervical right ICA. Left carotid system: Stable calcified plaque at the left CCA origin without stenosis. Intermittent mild plaque in the left CCA. Minimal calcified plaque at the left ICA origin and bulb without stenosis. Mildly tortuous cervical left ICA with a partially retropharyngeal course. Vertebral arteries: Stable tortuosity of the proximal right subclavian artery with a kinked appearance before the right vertebral artery origin. Stable calcified plaque at the right vertebral artery origin with mild stenosis. The right vertebral artery is patent to the skull base without additional  stenosis. No proximal left subclavian artery stenosis despite some soft plaque. Normal left vertebral artery origin. Tortuous left V1 segment with a mildly kinked appearance. Patent left vertebral artery to the skull base without additional stenosis. CTA HEAD Posterior circulation: Codominant distal vertebral arteries. Patent left PICA origin. The right AICA appears dominant. Patent vertebrobasilar junction. Patent basilar artery without stenosis. Normal SCA and left PCA origins. Fetal type right PCA origin. Bilateral PCA branches are within normal limits. Anterior circulation: Both ICA siphons are patent. Mild siphon plaque. No siphon stenosis. 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 This appears to arise from the undersurface of the left supraclinoid segment (paraophthalmic). 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 This aneurysm might incorporate the origin of the right ophthalmic artery, uncertain. Carotid termini are stable and patent. Dominant left ACA A1 segment, the right A1 is diminutive. MCA and ACA origins are normal. Anterior communicating artery and bilateral ACA branches are within normal limits. Left MCA M1 segment, left MCA bifurcation, and left MCA branches are stable and within normal limits. Right MCA M1 segment, bifurcation, and right MCA branches are stable and within normal limits. Venous sinuses: Patent. Anatomic variants: Dominant left and diminutive right ACA A1 segments. Fetal type right  PCA origin. Review of the MIP images confirms the above findings IMPRESSION: 1. Negative for emergent large vessel occlusion. No core infarct or penumbra detected by CTP. Results of #1 were communicated to Dr. Rory Percy at 1411 hr on 1/24/2019by text page via the [facility] messaging system. 2.  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 3. Up to moderate arch and great vessel origin atherosclerosis, but otherwise mild for age atherosclerosis in the head and neck. No significant arterial stenosis identified. 4. Nonspecific patchy opacity in both upper lungs appears similar to that on 01/24/17, perhaps a combination of atelectasis and chronic lung disease. Electronically Signed   By: Genevie Ann M.D.   On: 03/12/2017 14:24   Ir Transcath/emboliz  Result Date: 03/17/2017 INDICATION: Severe headaches. Workup revealed presence of multiple intracranial aneurysms. EXAM: BILATERAL COMMON CAROTID ARTERIOGRAMS, RIGHT VERTEBRAL ARTERY ANGIOGRAM, FOLLOWED BY ENDOVASCULAR TREATMENT OF LOBULATED RIGHT INTERNAL CAROTID ARTERY PARAOPHTHALMIC REGION ANEURYSM WITH PIPELINE FLOW DIVERTER DEVICE. MEDICATIONS: Ancef 2 g IV antibiotic was administered within 1 hour of the procedure. ANESTHESIA/SEDATION: Mac anesthesia followed by general anesthesia by the Department of Anesthesiology at [place]:  Isovue 300 approximately 120 mL. FLUOROSCOPY TIME:  Fluoroscopy Time: 50 minutes 42 seconds (3768 mGy). COMPLICATIONS: None immediate. PROCEDURE: Informed consent was obtained from the patient following explanation of the procedure, risks, benefits and alternatives. The patient understands, agrees and consents for the procedure. All questions were addressed. A time out was performed prior to the initiation of the procedure. Maximal barrier sterile technique utilized including caps, mask, sterile gowns, sterile gloves, large sterile drape, hand hygiene, and Betadine prep. The right groin was prepped and draped in the usual sterile fashion. Thereafter using modified Seldinger technique, transfemoral access into right common femoral artery was obtained without difficulty. Over  a 0.035 inch guidewire, a 5 French Pinnacle sheath was inserted. Through this, and also over a 0.035 inch guidewire a 5 [place] JB 1 catheter was advanced to the aortic arch region and selectively positioned in the innominate artery, right common carotid artery and left common carotid artery. FINDINGS: The left common carotid arteriogram demonstrates the left external carotid artery and its major branches to be widely patent. The left internal carotid artery at the bulb to the cranial skull base opacifies normally. The petrous, the cavernous and supraclinoid segments demonstrate wide patency. 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 This measures approximately 5.7 mm x 4 mm on the oblique AP projection, and 8 mm x 3.4 mm on the lateral projection. The left middle cerebral artery and the left anterior cerebral artery opacify into the capillary and venous phases. Prompt opacification via the anterior communicating artery of the right anterior cerebral artery A2 segment and distally is noted. The innominate artery injection demonstrates the origin of the right common carotid artery and the right subclavian arteries to be widely patent. The right vertebral artery at its origin and its visualized cervical portion is seen to opacify to the cranial skull base. On lateral projection, wide patency is seen of the right vertebrobasilar junction and the right posterior-inferior cerebellar artery. The opacified portion of the right vertebral artery on the lateral projection demonstrates wide patency into the distal basilar artery. The right common carotid arteriogram  demonstrates the right external carotid artery and its major branches to be widely patent. The right internal carotid artery at the bulb to the cranial skull base opacifies normally. The  petrous, the cavernous and the supraclinoid segments demonstrate wide patency. A right posterior communicating artery is seen opacifying the right posterior cerebral artery distribution. The right middle cerebral artery is seen to opacify into the capillary and venous phases. Hypoplastic to aplastic right anterior cerebral A1 segment is also noted. A complex bilobed configuration emanating from the right internal carotid artery in the paraophthalmic region is noted. A 3D rotational arteriogram was then performed centered over this area. 3D reconstruction images were then performed on a separate workstation. These revealed the presence of an aneurysm at the level of the ophthalmic artery with a second hemorrhage projecting laterally with a wide-base. A dysplastic appearing right internal carotid artery is seen at this level. The right middle cerebral artery and the right anterior cerebral artery opacify into the capillary and venous phases. Measurements were performed of the right internal carotid artery distal to and also proximal to the landing zones of the planned pipeline flex placement device proximal to the right posterior communicating artery. The diameter of the distal landing zone was measured at 3.5 mm and the proximal landing zone as approximately 4.5 mm. The length to be covered was measured at approximately 20 mm. It was decided to use a 4 mm x 16 mm pipeline flex flow diverter device. The diagnostic JB 1 catheter was exchanged over a 0.035 inch 300 cm Rosen exchange guidewire for a 6 French 80 cm Cook shuttle sheath using biplane roadmap technique and constant fluoroscopic guidance. Good aspiration was obtained from the hub of the [place] shuttle sheath using biplane roadmap technique and constant fluoroscopic guidance. A gentle contrast injection demonstrated no evidence of spasms, dissections or of intraluminal filling defects. Through the [facility] shuttle sheath, a 115 cm 5 [place] Catalyst guide  catheter was then advanced over 0.035 inch Roadrunner guidewire using biplane roadmap technique and constant fluoroscopic guidance. The 5 [place] Catalyst guide catheter was advanced to the distal cervical left ICA. The guidewire was removed. Good aspiration obtained from the hub of the 5 [place] guide catheter. A gentle contrast injection demonstrated no evidence of spasms, dissections or of intraluminal filling defects. A control arteriogram performed centered intracranially demonstrated no changes intracranially. At this time the [place] shuttle sheath had been advanced to the mid cervical segment of the right internal carotid artery. At this time a 027 Phenom microcatheter was advanced over a 0.014 inch Softip Synchro micro guidewire to the distal end of the 5 [place] guide catheter. With the micro guidewire leading with a J-tip configuration to avoid dissections or inducing spasm, access was obtained past the 2 wide-based aneurysm into the supraclinoid right ICA followed by the microcatheter. The micro guidewire was then advanced into the M2 region of the inferior division of the right middle cerebral artery followed by the microcatheter. The guidewire was removed. Good aspiration was obtained from the hub of the Phenom microcatheter. A gentle contrast injection demonstrated safe positioning of the tip of the microcatheter. This was then connected to continuous heparinized saline infusion. A 4 mm x 16 mm pipeline flex device was then advanced using biplane roadmap technique and constant fluoroscopic guidance to the distal end of the microcatheter. The O ring on the delivery system was then loosened. With slight forward gentle traction with the right hand on the delivery micro  guidewire, with the left hand the delivery microcatheter was gently retrieved unsheathing the distal wire and then a few mm of the distal portion of the device. Once the cigar-shaped configuration had opened up in the left middle  cerebral artery the combination was then retracted to the left internal carotid artery supraclinoid segment and just proximal to the origin of the left posterior communicating artery. Once there, using the push and pull technique followed by fluffing and also advancing the wire to deploy, the device was utilized under constant fluoroscopic guidance to deploy the distal and then the proximal portion of the device. Once deployed the distal portion of the device was noted to be just proximal to the right posterior communicating artery, with the proximal being in the mid horizontal proximal cavernous segment. The microcatheter was then gently advanced over the wire to the right middle cerebral artery. The delivery micro guidewire was then retrieved without difficulty. A control arteriogram performed through the 5 [place] Catalyst guide catheter demonstrated excellent apposition and coverage distally and also proximally. A control arteriogram performed following removal of the microcatheter into the right internal carotid artery, however, demonstrated complete patency of the device which was now noted to be just distal to the origins of the necks of the 2 aneurysms distal to the ophthalmic artery. It was then felt to provide more coverage at the neck of the aneurysm and distally with improved diversion, a second pipeline device measuring 4 mm x 12 mm was advanced to the distal end of the Phenom microcatheter which was now again positioned in the M1 M2 region of the inferior division of the right middle cerebral artery. Again the entire system was then straightened. With slight forward gentle traction with the right hand on the delivery micro guidewire, the microcatheter was gently retrieved unsheathing the distal wire and then the part of the device distally. Once opened, the combination was retrieved such that the distal portion of the device was now at the level of the posterior communicating artery. Thereafter, again  using the advancement of the micro guidewire to deploy the device, and the fluffing technique to insure apposition of the device to the parent artery were used. The tip of the microcatheter was maintained centrally within the parent vessel lumen. Once completely deployed, a control arteriogram performed through the 5 [place] guide catheter in the right internal carotid artery demonstrated excellent coverage distally and proximally. There was now stasis within the 2 aneurysms. The microcatheter was then gently retrieved under constant fluoroscopic guidance to ensure no movement of the devices. None was observed. Control arteriograms were then performed at 15 and 30 minutes post deployment of the second device. These continued to demonstrate safe apposition, and maintaining the distal coverage and the proximal coverage of the aneurysms. Throughout the procedure, the patient's blood pressure and neurological status remained stable. The patient's ACT was maintained in the region of approximately 200 seconds. A final control arteriogram performed through the [place] shuttle sheath in the right internal carotid artery continued to demonstrate stasis within the aneurysms in the paraophthalmic region. The distal portion of the second device was seen proximal to the right posterior communicating artery with excellent coverage of the neck of the aneurysms. No intraluminal filling defects or occlusions were seen. The 6 [place] Cook shuttle sheath was then retrieved into the abdominal aorta and exchanged over a J-tip guidewire for a 6 [place] Pinnacle sheath. This in turn was then removed with the successful application of an Angio-Seal closure  device. At the end of the procedure, the patient's right groin appeared soft without evidence of a hematoma, or of bleeding. The distal pulses remained palpable in the dorsalis pedis regions, and Dopplerable in the posterior tibial regions bilaterally unchanged from prior to the  procedure. The patient's general anesthesia was then reversed and the patient was extubated without difficulty. Upon recovery the patient demonstrated no new neurological signs or symptoms. This remain unchanged compared to prior to the procedure. She was then transported to the PACU and the neuro ICU to continue on low-dose IV heparin and close monitoring of her blood pressure and neurological status. Throughout the night the patient's neurological status remained stable. The patient reportedly had some ecchymosis at the groin puncture site on account of her persistent moving despite multiple attempts to convince her to keep her right leg straight. There was no discrete palpable hematoma the following day. However, there was in the inguinal region and the subinguinal region. The patient's neurological status the following day remained stable with no new motor or coordination or speech difficulties. IV heparin was stopped and the patient was switched to aspirin 325 mg a day, and Plavix 75 mg a day. She was kept in the neuro ICU on account of her medical issues being addressed to by ID. She continued to be stable neurologically and eating normally. The patient underwent ultrasound of the right groin which showed no evidence of a pseudoaneurysm or a large hematoma. A relatively small hematoma was noted. The patient's distal pulses remained palpable and as described earlier. She was then referred back to internal medicine for management of her medical issues. Patient was instructed to maintain adequate hydration by drinking water and taking her antiplatelets. She will be seen in follow-up in the clinic in about 2-3 weeks. Questions were answered to her satisfaction. Patient will be back to internal medicine service. 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 Electronically Signed   By: Luanne Bras M.D.   On: 03/16/2017 12:12   Ir Angiogram Follow Up Study  Result Date: 03/17/2017 INDICATION: Severe headaches. Workup revealed presence of multiple intracranial aneurysms. EXAM: BILATERAL COMMON CAROTID ARTERIOGRAMS, RIGHT VERTEBRAL ARTERY ANGIOGRAM, FOLLOWED BY ENDOVASCULAR TREATMENT OF LOBULATED RIGHT INTERNAL CAROTID ARTERY PARAOPHTHALMIC REGION ANEURYSM WITH PIPELINE FLOW DIVERTER DEVICE. MEDICATIONS: Ancef 2 g IV antibiotic was administered within 1 hour of the procedure. ANESTHESIA/SEDATION: Mac anesthesia followed by general anesthesia by the Department of Anesthesiology at [place]:  Isovue 300 approximately 120 mL. FLUOROSCOPY TIME:  Fluoroscopy Time: 50 minutes 42 seconds (3768 mGy). COMPLICATIONS: None immediate. PROCEDURE: Informed consent was obtained from the patient following explanation of the procedure, risks, benefits and alternatives. The patient understands, agrees and consents for the procedure. All questions were addressed. A time out was performed prior to the initiation of the procedure. Maximal barrier sterile technique utilized including caps, mask, sterile gowns, sterile gloves, large sterile drape, hand hygiene, and Betadine prep. The right groin was prepped and draped in the usual sterile fashion. Thereafter using modified Seldinger technique, transfemoral access into right common femoral artery was obtained without difficulty. Over a 0.035 inch guidewire, a 5 [place] Pinnacle sheath was inserted. Through this, and also over a 0.035 inch guidewire a 5 [place] JB 1 catheter was advanced to the aortic arch region and selectively positioned in the innominate artery, right common carotid artery and  left common carotid artery. FINDINGS: The left common carotid arteriogram demonstrates the left external carotid artery and its major branches to be widely patent. The left internal carotid artery at the bulb  to the cranial skull base opacifies normally. The petrous, the cavernous and supraclinoid segments demonstrate wide patency. 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 This measures approximately 5.7 mm x 4 mm on the oblique AP projection, and 8 mm x 3.4 mm on the lateral projection. The left middle cerebral artery and the left anterior cerebral artery opacify into the capillary and venous phases. Prompt opacification via the anterior communicating artery of the right anterior cerebral artery A2 segment and distally is noted. The innominate artery injection demonstrates the origin of the right common carotid artery and the right subclavian arteries to be widely patent. The right vertebral artery at its origin and its visualized cervical portion is seen to opacify to the cranial skull base. On lateral projection, wide patency is seen of the right vertebrobasilar junction and the right posterior-inferior cerebellar artery. The opacified portion of the right vertebral artery on the lateral projection demonstrates wide patency into the distal basilar artery. The right common carotid arteriogram demonstrates the right external carotid artery and its major branches to be widely patent. The right internal carotid artery at the bulb to the cranial skull base opacifies normally. The petrous, the cavernous and the supraclinoid segments demonstrate wide patency. A right posterior communicating artery is seen opacifying the right posterior cerebral artery distribution. The right middle cerebral artery is seen to opacify into the capillary and venous phases. Hypoplastic to aplastic right anterior cerebral A1 segment is also noted. A complex bilobed configuration emanating from the right internal carotid artery in the paraophthalmic region is noted. A 3D  rotational arteriogram was then performed centered over this area. 3D reconstruction images were then performed on a separate workstation. These revealed the presence of an aneurysm at the level of the ophthalmic artery with a second hemorrhage projecting laterally with a wide-base. A dysplastic appearing right internal carotid artery is seen at this level. The right middle cerebral artery and the right anterior cerebral artery opacify into the capillary and venous phases. Measurements were performed of the right internal carotid artery distal to and also proximal to the landing zones of the planned pipeline flex placement device proximal to the right posterior communicating artery. The diameter of the distal landing zone was measured at 3.5 mm and the proximal landing zone as approximately 4.5 mm. The length to be covered was measured at approximately 20 mm. It was decided to use a 4 mm x 16 mm pipeline flex flow diverter device. The diagnostic JB 1 catheter was exchanged over a 0.035 inch 300 cm Rosen exchange guidewire for a 6 French 80 cm Cook shuttle sheath using biplane roadmap technique and constant fluoroscopic guidance. Good aspiration was obtained from the hub of the [place] shuttle sheath using biplane roadmap technique and constant fluoroscopic guidance. A gentle contrast injection demonstrated no evidence of spasms, dissections or of intraluminal filling defects. Through the [facility] shuttle sheath, a 115 cm 5 [place] Catalyst guide catheter was then advanced over 0.035 inch Roadrunner guidewire using biplane roadmap technique and constant fluoroscopic guidance. The 5 [place] Catalyst guide catheter was advanced to the distal cervical left ICA. The guidewire was removed. Good  aspiration obtained from the hub of the 5 [place] guide catheter. A gentle contrast injection demonstrated no evidence of spasms, dissections or of intraluminal filling defects. A control arteriogram performed centered  intracranially demonstrated no changes intracranially. At this time the [place] shuttle sheath had been advanced to the mid cervical segment of the right internal carotid artery. At this time a 027 Phenom microcatheter was advanced over a 0.014 inch Softip Synchro micro guidewire to the distal end of the 5 [place] guide catheter. With the micro guidewire leading with a J-tip configuration to avoid dissections or inducing spasm, access was obtained past the 2 wide-based aneurysm into the supraclinoid right ICA followed by the microcatheter. The micro guidewire was then advanced into the M2 region of the inferior division of the right middle cerebral artery followed by the microcatheter. The guidewire was removed. Good aspiration was obtained from the hub of the Phenom microcatheter. A gentle contrast injection demonstrated safe positioning of the tip of the microcatheter. This was then connected to continuous heparinized saline infusion. A 4 mm x 16 mm pipeline flex device was then advanced using biplane roadmap technique and constant fluoroscopic guidance to the distal end of the microcatheter. The O ring on the delivery system was then loosened. With slight forward gentle traction with the right hand on the delivery micro guidewire, with the left hand the delivery microcatheter was gently retrieved unsheathing the distal wire and then a few mm of the distal portion of the device. Once the cigar-shaped configuration had opened up in the left middle cerebral artery the combination was then retracted to the left internal carotid artery supraclinoid segment and just proximal to the origin of the left posterior communicating artery. Once there, using the push and pull technique followed by fluffing and also advancing the wire to deploy, the device was utilized under constant fluoroscopic guidance to deploy the distal and then the proximal portion of the device. Once deployed the distal portion of the device was  noted to be just proximal to the right posterior communicating artery, with the proximal being in the mid horizontal proximal cavernous segment. The microcatheter was then gently advanced over the wire to the right middle cerebral artery. The delivery micro guidewire was then retrieved without difficulty. A control arteriogram performed through the 5 [place] Catalyst guide catheter demonstrated excellent apposition and coverage distally and also proximally. A control arteriogram performed following removal of the microcatheter into the right internal carotid artery, however, demonstrated complete patency of the device which was now noted to be just distal to the origins of the necks of the 2 aneurysms distal to the ophthalmic artery. It was then felt to provide more coverage at the neck of the aneurysm and distally with improved diversion, a second pipeline device measuring 4 mm x 12 mm was advanced to the distal end of the Phenom microcatheter which was now again positioned in the M1 M2 region of the inferior division of the right middle cerebral artery. Again the entire system was then straightened. With slight forward gentle traction with the right hand on the delivery micro guidewire, the microcatheter was gently retrieved unsheathing the distal wire and then the part of the device distally. Once opened, the combination was retrieved such that the distal portion of the device was now at the level of the posterior communicating artery. Thereafter, again using the advancement of the micro guidewire to deploy the device, and the fluffing technique to insure apposition of the device to the  parent artery were used. The tip of the microcatheter was maintained centrally within the parent vessel lumen. Once completely deployed, a control arteriogram performed through the 5 [place] guide catheter in the right internal carotid artery demonstrated excellent coverage distally and proximally. There was now stasis within the 2  aneurysms. The microcatheter was then gently retrieved under constant fluoroscopic guidance to ensure no movement of the devices. None was observed. Control arteriograms were then performed at 15 and 30 minutes post deployment of the second device. These continued to demonstrate safe apposition, and maintaining the distal coverage and the proximal coverage of the aneurysms. Throughout the procedure, the patient's blood pressure and neurological status remained stable. The patient's ACT was maintained in the region of approximately 200 seconds. A final control arteriogram performed through the [place] shuttle sheath in the right internal carotid artery continued to demonstrate stasis within the aneurysms in the paraophthalmic region. The distal portion of the second device was seen proximal to the right posterior communicating artery with excellent coverage of the neck of the aneurysms. No intraluminal filling defects or occlusions were seen. The 6 [place] Cook shuttle sheath was then retrieved into the abdominal aorta and exchanged over a J-tip guidewire for a 6 [place] Pinnacle sheath. This in turn was then removed with the successful application of an Angio-Seal closure device. At the end of the procedure, the patient's right groin appeared soft without evidence of a hematoma, or of bleeding. The distal pulses remained palpable in the dorsalis pedis regions, and Dopplerable in the posterior tibial regions bilaterally unchanged from prior to the procedure. The patient's general anesthesia was then reversed and the patient was extubated without difficulty. Upon recovery the patient demonstrated no new neurological signs or symptoms. This remain unchanged compared to prior to the procedure. She was then transported to the PACU and the neuro ICU to continue on low-dose IV heparin and close monitoring of her blood pressure and neurological status. Throughout the night the patient's neurological status remained  stable. The patient reportedly had some ecchymosis at the groin puncture site on account of her persistent moving despite multiple attempts to convince her to keep her right leg straight. There was no discrete palpable hematoma the following day. However, there was in the inguinal region and the subinguinal region. The patient's neurological status the following day remained stable with no new motor or coordination or speech difficulties. IV heparin was stopped and the patient was switched to aspirin 325 mg a day, and Plavix 75 mg a day. She was kept in the neuro ICU on account of her medical issues being addressed to by ID. She continued to be stable neurologically and eating normally. The patient underwent ultrasound of the right groin which showed no evidence of a pseudoaneurysm or a large hematoma. A relatively small hematoma was noted. The patient's distal pulses remained palpable and as described earlier. She was then referred back to internal medicine for management of her medical issues. Patient was instructed to maintain adequate hydration by drinking water and taking her antiplatelets. She will be seen in follow-up in the clinic in about 2-3 weeks. Questions were answered to her satisfaction. Patient will be back to internal medicine service. 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 Electronically Signed   By: Luanne Bras M.D.   On: 03/16/2017 12:12   Ir 3d Primitivo Gauze Darreld Mclean  Result Date: 03/17/2017 INDICATION: Severe headaches. Workup revealed presence of multiple intracranial aneurysms. EXAM: BILATERAL COMMON CAROTID ARTERIOGRAMS, RIGHT VERTEBRAL ARTERY ANGIOGRAM, FOLLOWED BY ENDOVASCULAR TREATMENT OF LOBULATED RIGHT INTERNAL CAROTID ARTERY PARAOPHTHALMIC REGION ANEURYSM WITH PIPELINE FLOW DIVERTER DEVICE.  MEDICATIONS: Ancef 2 g IV antibiotic was administered within 1 hour of the procedure. ANESTHESIA/SEDATION: Mac anesthesia followed by general anesthesia by the Department of Anesthesiology at [place]:  Isovue 300 approximately 120 mL. FLUOROSCOPY TIME:  Fluoroscopy Time: 50 minutes 42 seconds (3768 mGy). COMPLICATIONS: None immediate. PROCEDURE: Informed consent was obtained from the patient following explanation of the procedure, risks, benefits and alternatives. The patient understands, agrees and consents for the procedure. All questions were addressed. A time out was performed prior to the initiation of the procedure. Maximal barrier sterile technique utilized including caps, mask, sterile gowns, sterile gloves, large sterile drape, hand hygiene, and Betadine prep. The right groin was prepped and draped in the usual sterile fashion. Thereafter using modified Seldinger technique, transfemoral access into right common femoral artery was obtained without difficulty. Over a 0.035 inch guidewire, a 5 French Pinnacle sheath was inserted. Through this, and also over a 0.035 inch guidewire a 5 [place] JB 1 catheter was advanced to the aortic arch region and selectively positioned in the innominate artery, right common carotid artery and left common carotid artery. FINDINGS: The left common carotid arteriogram demonstrates the left external carotid artery and its major branches to be widely patent. The left internal carotid artery at the bulb to the cranial skull base opacifies normally. The petrous, the cavernous and supraclinoid segments demonstrate wide patency. 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 This measures approximately 5.7 mm x 4 mm on the oblique AP projection, and 8 mm x 3.4 mm on the lateral projection. The  left middle cerebral artery and the left anterior cerebral artery opacify into the capillary and venous phases. Prompt opacification via the anterior communicating artery of the right anterior cerebral artery A2 segment and distally is noted. The innominate artery injection demonstrates the origin of the right common carotid artery and the right subclavian arteries to be widely patent. The right vertebral artery at its origin and its visualized cervical portion is seen to opacify to the cranial skull base. On lateral projection, wide patency is seen of the right vertebrobasilar junction and the right posterior-inferior cerebellar artery. The opacified portion of the right vertebral artery on the lateral projection demonstrates wide patency into the distal basilar artery. The right common carotid arteriogram demonstrates the right external carotid artery and its major branches to be widely patent. The right internal carotid artery at the bulb to the cranial skull base opacifies normally. The petrous, the cavernous and the supraclinoid segments demonstrate wide patency. A right posterior communicating artery is seen opacifying the right posterior cerebral artery distribution. The right middle cerebral artery is seen to opacify into the capillary and venous phases. Hypoplastic to aplastic right anterior cerebral A1 segment is also noted. A complex bilobed configuration emanating from the right internal carotid artery in the paraophthalmic region is noted. A 3D rotational arteriogram was then performed centered over this area. 3D reconstruction images were then performed on a separate workstation. These revealed the presence of an aneurysm at the level of the ophthalmic artery with a second hemorrhage projecting laterally with a wide-base. A  dysplastic appearing right internal carotid artery is seen at this level. The right middle cerebral artery and the right anterior cerebral artery opacify into the capillary and  venous phases. Measurements were performed of the right internal carotid artery distal to and also proximal to the landing zones of the planned pipeline flex placement device proximal to the right posterior communicating artery. The diameter of the distal landing zone was measured at 3.5 mm and the proximal landing zone as approximately 4.5 mm. The length to be covered was measured at approximately 20 mm. It was decided to use a 4 mm x 16 mm pipeline flex flow diverter device. The diagnostic JB 1 catheter was exchanged over a 0.035 inch 300 cm Rosen exchange guidewire for a 6 French 80 cm Cook shuttle sheath using biplane roadmap technique and constant fluoroscopic guidance. Good aspiration was obtained from the hub of the [place] shuttle sheath using biplane roadmap technique and constant fluoroscopic guidance. A gentle contrast injection demonstrated no evidence of spasms, dissections or of intraluminal filling defects. Through the [facility] shuttle sheath, a 115 cm 5 [place] Catalyst guide catheter was then advanced over 0.035 inch Roadrunner guidewire using biplane roadmap technique and constant fluoroscopic guidance. The 5 [place] Catalyst guide catheter was advanced to the distal cervical left ICA. The guidewire was removed. Good aspiration obtained from the hub of the 5 [place] guide catheter. A gentle contrast injection demonstrated no evidence of spasms, dissections or of intraluminal filling defects. A control arteriogram performed centered intracranially demonstrated no changes intracranially. At this time the [place] shuttle sheath had been advanced to the mid cervical segment of the right internal carotid artery. At this time a 027 Phenom microcatheter was advanced over a 0.014 inch Softip Synchro micro guidewire to the distal end of the 5 [place] guide catheter. With the micro guidewire leading with a J-tip configuration to avoid dissections or inducing spasm, access was obtained past the 2  wide-based aneurysm into the supraclinoid right ICA followed by the microcatheter. The micro guidewire was then advanced into the M2 region of the inferior division of the right middle cerebral artery followed by the microcatheter. The guidewire was removed. Good aspiration was obtained from the hub of the Phenom microcatheter. A gentle contrast injection demonstrated safe positioning of the tip of the microcatheter. This was then connected to continuous heparinized saline infusion. A 4 mm x 16 mm pipeline flex device was then advanced using biplane roadmap technique and constant fluoroscopic guidance to the distal end of the microcatheter. The O ring on the delivery system was then loosened. With slight forward gentle traction with the right hand on the delivery micro guidewire, with the left hand the delivery microcatheter was gently retrieved unsheathing the distal wire and then a few mm of the distal portion of the device. Once the cigar-shaped configuration had opened up in the left middle cerebral artery the combination was then retracted to the left internal carotid artery supraclinoid segment and just proximal to the origin of the left posterior communicating artery. Once there, using the push and pull technique followed by fluffing and also advancing the wire to deploy, the device was utilized under constant fluoroscopic guidance to deploy the distal and then the proximal portion of the device. Once deployed the distal portion of the device was noted to be just proximal to the right posterior communicating artery, with the proximal being in the mid horizontal proximal cavernous segment. The microcatheter was then gently advanced over the  wire to the right middle cerebral artery. The delivery micro guidewire was then retrieved without difficulty. A control arteriogram performed through the 5 [place] Catalyst guide catheter demonstrated excellent apposition and coverage distally and also proximally. A control  arteriogram performed following removal of the microcatheter into the right internal carotid artery, however, demonstrated complete patency of the device which was now noted to be just distal to the origins of the necks of the 2 aneurysms distal to the ophthalmic artery. It was then felt to provide more coverage at the neck of the aneurysm and distally with improved diversion, a second pipeline device measuring 4 mm x 12 mm was advanced to the distal end of the Phenom microcatheter which was now again positioned in the M1 M2 region of the inferior division of the right middle cerebral artery. Again the entire system was then straightened. With slight forward gentle traction with the right hand on the delivery micro guidewire, the microcatheter was gently retrieved unsheathing the distal wire and then the part of the device distally. Once opened, the combination was retrieved such that the distal portion of the device was now at the level of the posterior communicating artery. Thereafter, again using the advancement of the micro guidewire to deploy the device, and the fluffing technique to insure apposition of the device to the parent artery were used. The tip of the microcatheter was maintained centrally within the parent vessel lumen. Once completely deployed, a control arteriogram performed through the 5 [place] guide catheter in the right internal carotid artery demonstrated excellent coverage distally and proximally. There was now stasis within the 2 aneurysms. The microcatheter was then gently retrieved under constant fluoroscopic guidance to ensure no movement of the devices. None was observed. Control arteriograms were then performed at 15 and 30 minutes post deployment of the second device. These continued to demonstrate safe apposition, and maintaining the distal coverage and the proximal coverage of the aneurysms. Throughout the procedure, the patient's blood pressure and neurological status remained  stable. The patient's ACT was maintained in the region of approximately 200 seconds. A final control arteriogram performed through the [place] shuttle sheath in the right internal carotid artery continued to demonstrate stasis within the aneurysms in the paraophthalmic region. The distal portion of the second device was seen proximal to the right posterior communicating artery with excellent coverage of the neck of the aneurysms. No intraluminal filling defects or occlusions were seen. The 6 [place] Cook shuttle sheath was then retrieved into the abdominal aorta and exchanged over a J-tip guidewire for a 6 [place] Pinnacle sheath. This in turn was then removed with the successful application of an Angio-Seal closure device. At the end of the procedure, the patient's right groin appeared soft without evidence of a hematoma, or of bleeding. The distal pulses remained palpable in the dorsalis pedis regions, and Dopplerable in the posterior tibial regions bilaterally unchanged from prior to the procedure. The patient's general anesthesia was then reversed and the patient was extubated without difficulty. Upon recovery the patient demonstrated no new neurological signs or symptoms. This remain unchanged compared to prior to the procedure. She was then transported to the PACU and the neuro ICU to continue on low-dose IV heparin and close monitoring of her blood pressure and neurological status. Throughout the night the patient's neurological status remained stable. The patient reportedly had some ecchymosis at the groin puncture site on account of her persistent moving despite multiple attempts to convince her to keep her right  leg straight. There was no discrete palpable hematoma the following day. However, there was in the inguinal region and the subinguinal region. The patient's neurological status the following day remained stable with no new motor or coordination or speech difficulties. IV heparin was stopped and  the patient was switched to aspirin 325 mg a day, and Plavix 75 mg a day. She was kept in the neuro ICU on account of her medical issues being addressed to by ID. She continued to be stable neurologically and eating normally. The patient underwent ultrasound of the right groin which showed no evidence of a pseudoaneurysm or a large hematoma. A relatively small hematoma was noted. The patient's distal pulses remained palpable and as described earlier. She was then referred back to internal medicine for management of her medical issues. Patient was instructed to maintain adequate hydration by drinking water and taking her antiplatelets. She will be seen in follow-up in the clinic in about 2-3 weeks. Questions were answered to her satisfaction. Patient will be back to internal medicine service. 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 Electronically Signed   By: Luanne Bras M.D.   On: 03/16/2017 12:12   [place]  Result Date: 03/17/2017 INDICATION: Severe headaches. Workup revealed presence of multiple intracranial aneurysms. EXAM: BILATERAL COMMON CAROTID ARTERIOGRAMS, RIGHT VERTEBRAL ARTERY ANGIOGRAM, FOLLOWED BY ENDOVASCULAR TREATMENT OF LOBULATED RIGHT INTERNAL CAROTID ARTERY PARAOPHTHALMIC REGION ANEURYSM WITH PIPELINE FLOW DIVERTER DEVICE. MEDICATIONS: Ancef 2 g IV antibiotic was administered within 1 hour of the procedure. ANESTHESIA/SEDATION: Mac anesthesia followed by general anesthesia by the Department of Anesthesiology at [facility]:  Isovue 300 approximately 120 mL. FLUOROSCOPY TIME:  Fluoroscopy Time: 50 minutes 42 seconds (3768 mGy). COMPLICATIONS: None immediate. PROCEDURE: Informed consent was obtained from the patient following explanation of the procedure, risks, benefits and  alternatives. The patient understands, agrees and consents for the procedure. All questions were addressed. A time out was performed prior to the initiation of the procedure. Maximal barrier sterile technique utilized including caps, mask, sterile gowns, sterile gloves, large sterile drape, hand hygiene, and Betadine prep. The right groin was prepped and draped in the usual sterile fashion. Thereafter using modified Seldinger technique, transfemoral access into right common femoral artery was obtained without difficulty. Over a 0.035 inch guidewire, a 5 [place] Pinnacle sheath was inserted. Through this, and also over a 0.035 inch guidewire a 5 [place] JB 1 catheter was advanced to the aortic arch region and selectively positioned in the innominate artery, right common carotid artery and left common carotid artery. FINDINGS: The left common carotid arteriogram demonstrates the left external carotid artery and its major branches to be widely patent. The left internal carotid artery at the bulb to the cranial skull base opacifies normally. The petrous, the cavernous and supraclinoid segments demonstrate wide patency. 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 This measures approximately 5.7 mm x 4 mm on the oblique AP projection, and 8 mm x 3.4 mm on the lateral projection. The left middle cerebral artery and the left anterior cerebral artery opacify into the capillary and venous phases. Prompt opacification via the anterior communicating artery of the right anterior cerebral artery A2 segment and distally is noted.  The innominate artery injection demonstrates the origin of the right common carotid artery and the right subclavian arteries to be widely patent. The right vertebral artery at its origin and its visualized cervical portion is seen to  opacify to the cranial skull base. On lateral projection, wide patency is seen of the right vertebrobasilar junction and the right posterior-inferior cerebellar artery. The opacified portion of the right vertebral artery on the lateral projection demonstrates wide patency into the distal basilar artery. The right common carotid arteriogram demonstrates the right external carotid artery and its major branches to be widely patent. The right internal carotid artery at the bulb to the cranial skull base opacifies normally. The petrous, the cavernous and the supraclinoid segments demonstrate wide patency. A right posterior communicating artery is seen opacifying the right posterior cerebral artery distribution. The right middle cerebral artery is seen to opacify into the capillary and venous phases. Hypoplastic to aplastic right anterior cerebral A1 segment is also noted. A complex bilobed configuration emanating from the right internal carotid artery in the paraophthalmic region is noted. A 3D rotational arteriogram was then performed centered over this area. 3D reconstruction images were then performed on a separate workstation. These revealed the presence of an aneurysm at the level of the ophthalmic artery with a second hemorrhage projecting laterally with a wide-base. A dysplastic appearing right internal carotid artery is seen at this level. The right middle cerebral artery and the right anterior cerebral artery opacify into the capillary and venous phases. Measurements were performed of the right internal carotid artery distal to and also proximal to the landing zones of the planned pipeline flex placement device proximal to the right posterior communicating artery. The diameter of the distal landing zone was measured at 3.5 mm and the proximal landing zone as approximately 4.5 mm. The length to be covered was measured at approximately 20 mm. It was decided to use a 4 mm x 16 mm pipeline flex flow diverter device.  The diagnostic JB 1 catheter was exchanged over a 0.035 inch 300 cm Rosen exchange guidewire for a 6 French 80 cm [place] shuttle sheath using biplane roadmap technique and constant fluoroscopic guidance. Good aspiration was obtained from the hub of the [place] shuttle sheath using biplane roadmap technique and constant fluoroscopic guidance. A gentle contrast injection demonstrated no evidence of spasms, dissections or of intraluminal filling defects. Through the [facility] shuttle sheath, a 115 cm 5 [place] Catalyst guide catheter was then advanced over 0.035 inch [place] guidewire using biplane roadmap technique and constant fluoroscopic guidance. The 5 [place] Catalyst guide catheter was advanced to the distal cervical left ICA. The guidewire was removed. Good aspiration obtained from the hub of the 5 [place] guide catheter. A gentle contrast injection demonstrated no evidence of spasms, dissections or of intraluminal filling defects. A control arteriogram performed centered intracranially demonstrated no changes intracranially. At this time the [place] shuttle sheath had been advanced to the mid cervical segment of the right internal carotid artery. At this time a 027 Phenom microcatheter was advanced over a 0.014 inch Softip Synchro micro guidewire to the distal end of the 5 [place] guide catheter. With the micro guidewire leading with a J-tip configuration to avoid dissections or inducing spasm, access was obtained past the 2 wide-based aneurysm into the supraclinoid right ICA followed by the microcatheter. The micro guidewire was then advanced into the M2 region of the inferior division of the right middle cerebral artery followed by the microcatheter. The  guidewire was removed. Good aspiration was obtained from the hub of the Phenom microcatheter. A gentle contrast injection demonstrated safe positioning of the tip of the microcatheter. This was then connected to continuous heparinized saline infusion. A  4 mm x 16 mm pipeline flex device was then advanced using biplane roadmap technique and constant fluoroscopic guidance to the distal end of the microcatheter. The O ring on the delivery system was then loosened. With slight forward gentle traction with the right hand on the delivery micro guidewire, with the left hand the delivery microcatheter was gently retrieved unsheathing the distal wire and then a few mm of the distal portion of the device. Once the cigar-shaped configuration had opened up in the left middle cerebral artery the combination was then retracted to the left internal carotid artery supraclinoid segment and just proximal to the origin of the left posterior communicating artery. Once there, using the push and pull technique followed by fluffing and also advancing the wire to deploy, the device was utilized under constant fluoroscopic guidance to deploy the distal and then the proximal portion of the device. Once deployed the distal portion of the device was noted to be just proximal to the right posterior communicating artery, with the proximal being in the mid horizontal proximal cavernous segment. The microcatheter was then gently advanced over the wire to the right middle cerebral artery. The delivery micro guidewire was then retrieved without difficulty. A control arteriogram performed through the 5 [place] Catalyst guide catheter demonstrated excellent apposition and coverage distally and also proximally. A control arteriogram performed following removal of the microcatheter into the right internal carotid artery, however, demonstrated complete patency of the device which was now noted to be just distal to the origins of the necks of the 2 aneurysms distal to the ophthalmic artery. It was then felt to provide more coverage at the neck of the aneurysm and distally with improved diversion, a second pipeline device measuring 4 mm x 12 mm was advanced to the distal end of the Phenom microcatheter  which was now again positioned in the M1 M2 region of the inferior division of the right middle cerebral artery. Again the entire system was then straightened. With slight forward gentle traction with the right hand on the delivery micro guidewire, the microcatheter was gently retrieved unsheathing the distal wire and then the part of the device distally. Once opened, the combination was retrieved such that the distal portion of the device was now at the level of the posterior communicating artery. Thereafter, again using the advancement of the micro guidewire to deploy the device, and the fluffing technique to insure apposition of the device to the parent artery were used. The tip of the microcatheter was maintained centrally within the parent vessel lumen. Once completely deployed, a control arteriogram performed through the 5 [place] guide catheter in the right internal carotid artery demonstrated excellent coverage distally and proximally. There was now stasis within the 2 aneurysms. The microcatheter was then gently retrieved under constant fluoroscopic guidance to ensure no movement of the devices. None was observed. Control arteriograms were then performed at 15 and 30 minutes post deployment of the second device. These continued to demonstrate safe apposition, and maintaining the distal coverage and the proximal coverage of the aneurysms. Throughout the procedure, the patient's blood pressure and neurological status remained stable. The patient's ACT was maintained in the region of approximately 200 seconds. A final control arteriogram performed through the [place] shuttle sheath in the right  internal carotid artery continued to demonstrate stasis within the aneurysms in the paraophthalmic region. The distal portion of the second device was seen proximal to the right posterior communicating artery with excellent coverage of the neck of the aneurysms. No intraluminal filling defects or occlusions were  seen. The 6 [place] Cook shuttle sheath was then retrieved into the abdominal aorta and exchanged over a J-tip guidewire for a 6 [place] Pinnacle sheath. This in turn was then removed with the successful application of an Angio-Seal closure device. At the end of the procedure, the patient's right groin appeared soft without evidence of a hematoma, or of bleeding. The distal pulses remained palpable in the dorsalis pedis regions, and Dopplerable in the posterior tibial regions bilaterally unchanged from prior to the procedure. The patient's general anesthesia was then reversed and the patient was extubated without difficulty. Upon recovery the patient demonstrated no new neurological signs or symptoms. This remain unchanged compared to prior to the procedure. She was then transported to the PACU and the neuro ICU to continue on low-dose IV heparin and close monitoring of her blood pressure and neurological status. Throughout the night the patient's neurological status remained stable. The patient reportedly had some ecchymosis at the groin puncture site on account of her persistent moving despite multiple attempts to convince her to keep her right leg straight. There was no discrete palpable hematoma the following day. However, there was in the inguinal region and the subinguinal region. The patient's neurological status the following day remained stable with no new motor or coordination or speech difficulties. IV heparin was stopped and the patient was switched to aspirin 325 mg a day, and Plavix 75 mg a day. She was kept in the neuro ICU on account of her medical issues being addressed to by ID. She continued to be stable neurologically and eating normally. The patient underwent ultrasound of the right groin which showed no evidence of a pseudoaneurysm or a large hematoma. A relatively small hematoma was noted. The patient's distal pulses remained palpable and as described earlier. She was then referred back to  internal medicine for management of her medical issues. Patient was instructed to maintain adequate hydration by drinking water and taking her antiplatelets. She will be seen in follow-up in the clinic in about 2-3 weeks. Questions were answered to her satisfaction. Patient will be back to internal medicine service. 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 Electronically Signed   By: Luanne Bras M.D.   On: 03/16/2017 12:12   Ct Cerebral Perfusion W Contrast  Result Date: 03/12/2017 CLINICAL DATA:  67 year old female with abnormal speech and bilateral weakness. Bilateral ICA paraophthalmic aneurysms. EXAM: CT ANGIOGRAPHY HEAD AND NECK CT PERFUSION BRAIN TECHNIQUE: Multidetector CT imaging of the head and neck was performed using the standard protocol during bolus administration of intravenous contrast. Multiplanar CT image reconstructions and MIPs were obtained to evaluate the vascular anatomy. Carotid stenosis measurements (when applicable) are obtained utilizing NASCET criteria, using the distal internal carotid diameter as the denominator. Multiphase CT imaging of the brain was performed following IV bolus contrast injection. Subsequent parametric perfusion maps were calculated using RAPID software. CONTRAST:  100 milliliters Isovue 370 COMPARISON:  Head CT without contrast 1338 hr today. FINDINGS: CT Brain Perfusion Findings: CBF (<30%) Volume: 0 milliliters Perfusion (Tmax>6.0s) volume: 0 milliliters Mismatch Volume: Not applicablemL Infarction Location:Not applicable CTA NECK  Skeleton: Absent dentition. Cervical spine degeneration. Stable visualized osseous structures. Upper chest: Patchy and dependent pulmonary opacity in the upper lungs is similar to that in December and may reflect a combination of chronic lung disease  and atelectasis. No superior mediastinal lymphadenopathy. Other neck: Negative aside from a partially retropharyngeal course of both carotid arteries. No cervical lymphadenopathy. Aortic arch: 3 vessel arch configuration. Stable mild to moderate arch and great vessel origin calcified plaque. Right carotid system: Mildly increased soft plaque in the ventral brachiocephalic artery since December, or might have been obscured by artifact previously. No significant brachiocephalic artery stenosis. No right CCA origin stenosis. Tortuous proximal right CCA. Mild plaque in the right CCA and at the right carotid bifurcation without stenosis. Partially retropharyngeal but otherwise negative cervical right ICA. Left carotid system: Stable calcified plaque at the left CCA origin without stenosis. Intermittent mild plaque in the left CCA. Minimal calcified plaque at the left ICA origin and bulb without stenosis. Mildly tortuous cervical left ICA with a partially retropharyngeal course. Vertebral arteries: Stable tortuosity of the proximal right subclavian artery with a kinked appearance before the right vertebral artery origin. Stable calcified plaque at the right vertebral artery origin with mild stenosis. The right vertebral artery is patent to the skull base without additional stenosis. No proximal left subclavian artery stenosis despite some soft plaque. Normal left vertebral artery origin. Tortuous left V1 segment with a mildly kinked appearance. Patent left vertebral artery to the skull base without additional stenosis. CTA HEAD Posterior circulation: Codominant distal vertebral arteries. Patent left PICA origin. The right AICA appears dominant. Patent vertebrobasilar junction. Patent basilar artery without stenosis. Normal SCA and left PCA origins. Fetal type right PCA origin. Bilateral PCA branches are within normal limits. Anterior circulation: Both ICA siphons are patent. Mild siphon plaque. No siphon stenosis. 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 This appears to arise from the undersurface of the left supraclinoid segment (paraophthalmic). 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 hand hygiene, and Betadine prep. The right groin was prepped and draped in the usual sterile fashion. Thereafter using modified Seldinger technique, transfemoral access into right common femoral artery was obtained without difficulty. Over a 0.035 inch guidewire, a 5 French Pinnacle sheath was inserted. Through this, and also over a 0.035 inch guidewire a 5 [place] JB 1 catheter was advanced to the aortic arch region and selectively positioned in the innominate artery, right common carotid artery and left common carotid artery. FINDINGS: The left common carotid arteriogram demonstrates the left external carotid artery and its major branches to be widely patent. The left internal carotid artery at the bulb to the cranial skull base opacifies normally. The petrous, the cavernous and supraclinoid segments demonstrate wide patency. 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 This measures approximately 5.7 mm x 4 mm on the oblique AP projection, and 8 mm x 3.4 mm on the lateral projection. The left middle cerebral artery and the left anterior cerebral artery opacify into the capillary and venous phases. Prompt opacification via the anterior communicating artery of the right anterior cerebral artery A2 segment and distally is noted. The innominate artery injection demonstrates the origin of the right common carotid artery and the right subclavian arteries to be widely patent. The right vertebral artery at its origin and its visualized cervical portion is seen to opacify to the cranial skull base. On lateral projection, wide patency is seen of the right vertebrobasilar junction and the right posterior-inferior cerebellar artery. The opacified portion of the right vertebral artery on the lateral projection demonstrates wide patency into the distal basilar artery. The right common carotid arteriogram demonstrates the right external carotid artery and its major branches to be widely patent. The right internal carotid artery at the bulb to the cranial skull base opacifies normally. The petrous, the cavernous and the supraclinoid segments demonstrate wide patency. A right posterior communicating artery is seen opacifying the right posterior cerebral artery distribution. The right middle cerebral artery is seen to opacify into the capillary and venous phases. Hypoplastic to aplastic right anterior cerebral A1 segment is also noted. A complex bilobed configuration emanating from the right internal carotid artery in the paraophthalmic region is noted. A 3D rotational arteriogram was then performed centered over this area. 3D reconstruction images were then performed on a separate workstation. These revealed the presence of an aneurysm at the level of the ophthalmic artery with a second hemorrhage projecting laterally with a wide-base. A dysplastic appearing right internal carotid  artery is seen at this level. The right middle cerebral artery and the right anterior cerebral artery opacify into the capillary and venous phases. Measurements were performed of the right internal carotid artery distal to and also proximal to the landing zones of the planned pipeline flex placement device proximal to the right posterior communicating artery. The diameter of the distal landing zone was measured at 3.5 mm and the proximal landing zone as approximately 4.5 mm. The length to be covered was measured at approximately 20 mm. It was decided to use a 4 mm x 16 mm pipeline flex flow diverter device. The diagnostic JB 1 catheter was exchanged over a 0.035 inch 300 cm Rosen exchange guidewire for a 6 French 80 cm Cook shuttle sheath using biplane roadmap technique and constant fluoroscopic guidance. Good aspiration was obtained from the hub of the [place] shuttle sheath using biplane roadmap technique and constant fluoroscopic guidance. A gentle contrast injection demonstrated no evidence of spasms, dissections or of intraluminal filling defects. Through the [facility] shuttle sheath, a 115 cm 5 [place] Catalyst guide catheter was then advanced over 0.035 inch Roadrunner guidewire using biplane roadmap technique and constant fluoroscopic guidance. The 5 [place] Catalyst guide catheter was advanced to the distal cervical left ICA. The guidewire was removed. Good aspiration obtained from the hub of the 5 [place] guide catheter. A gentle contrast injection demonstrated no evidence of spasms, dissections or of intraluminal filling defects. A control arteriogram performed centered intracranially demonstrated no changes intracranially. At this time the [place] shuttle sheath had been advanced to the mid cervical segment of the right internal carotid artery. At this time a 027 Phenom microcatheter was advanced over  a 0.014 inch Softip Synchro micro guidewire to the distal end of the 5 [place] guide catheter. With  the micro guidewire leading with a J-tip configuration to avoid dissections or inducing spasm, access was obtained past the 2 wide-based aneurysm into the supraclinoid right ICA followed by the microcatheter. The micro guidewire was then advanced into the M2 region of the inferior division of the right middle cerebral artery followed by the microcatheter. The guidewire was removed. Good aspiration was obtained from the hub of the Phenom microcatheter. A gentle contrast injection demonstrated safe positioning of the tip of the microcatheter. This was then connected to continuous heparinized saline infusion. A 4 mm x 16 mm pipeline flex device was then advanced using biplane roadmap technique and constant fluoroscopic guidance to the distal end of the microcatheter. The O ring on the delivery system was then loosened. With slight forward gentle traction with the right hand on the delivery micro guidewire, with the left hand the delivery microcatheter was gently retrieved unsheathing the distal wire and then a few mm of the distal portion of the device. Once the cigar-shaped configuration had opened up in the left middle cerebral artery the combination was then retracted to the left internal carotid artery supraclinoid segment and just proximal to the origin of the left posterior communicating artery. Once there, using the push and pull technique followed by fluffing and also advancing the wire to deploy, the device was utilized under constant fluoroscopic guidance to deploy the distal and then the proximal portion of the device. Once deployed the distal portion of the device was noted to be just proximal to the right posterior communicating artery, with the proximal being in the mid horizontal proximal cavernous segment. The microcatheter was then gently advanced over the wire to the right middle cerebral artery. The delivery micro guidewire was then retrieved without difficulty. A control arteriogram performed  through the 5 [place] Catalyst guide catheter demonstrated excellent apposition and coverage distally and also proximally. A control arteriogram performed following removal of the microcatheter into the right internal carotid artery, however, demonstrated complete patency of the device which was now noted to be just distal to the origins of the necks of the 2 aneurysms distal to the ophthalmic artery. It was then felt to provide more coverage at the neck of the aneurysm and distally with improved diversion, a second pipeline device measuring 4 mm x 12 mm was advanced to the distal end of the Phenom microcatheter which was now again positioned in the M1 M2 region of the inferior division of the right middle cerebral artery. Again the entire system was then straightened. With slight forward gentle traction with the right hand on the delivery micro guidewire, the microcatheter was gently retrieved unsheathing the distal wire and then the part of the device distally. Once opened, the combination was retrieved such that the distal portion of the device was now at the level of the posterior communicating artery. Thereafter, again using the advancement of the micro guidewire to deploy the device, and the fluffing technique to insure apposition of the device to the parent artery were used. The tip of the microcatheter was maintained centrally within the parent vessel lumen. Once completely deployed, a control arteriogram performed through the 5 [place] guide catheter in the right internal carotid artery demonstrated excellent coverage distally and proximally. There was now stasis within the 2 aneurysms. The microcatheter was then gently retrieved under constant fluoroscopic guidance to ensure no movement of the devices. None was  observed. Control arteriograms were then performed at 15 and 30 minutes post deployment of the second device. These continued to demonstrate safe apposition, and maintaining the distal coverage and  the proximal coverage of the aneurysms. Throughout the procedure, the patient's blood pressure and neurological status remained stable. The patient's ACT was maintained in the region of approximately 200 seconds. A final control arteriogram performed through the [place] shuttle sheath in the right internal carotid artery continued to demonstrate stasis within the aneurysms in the paraophthalmic region. The distal portion of the second device was seen proximal to the right posterior communicating artery with excellent coverage of the neck of the aneurysms. No intraluminal filling defects or occlusions were seen. The 6 [place] Cook shuttle sheath was then retrieved into the abdominal aorta and exchanged over a J-tip guidewire for a 6 [place] Pinnacle sheath. This in turn was then removed with the successful application of an Angio-Seal closure device. At the end of the procedure, the patient's right groin appeared soft without evidence of a hematoma, or of bleeding. The distal pulses remained palpable in the dorsalis pedis regions, and Dopplerable in the posterior tibial regions bilaterally unchanged from prior to the procedure. The patient's general anesthesia was then reversed and the patient was extubated without difficulty. Upon recovery the patient demonstrated no new neurological signs or symptoms. This remain unchanged compared to prior to the procedure. She was then transported to the PACU and the neuro ICU to continue on low-dose IV heparin and close monitoring of her blood pressure and neurological status. Throughout the night the patient's neurological status remained stable. The patient reportedly had some ecchymosis at the groin puncture site on account of her persistent moving despite multiple attempts to convince her to keep her right leg straight. There was no discrete palpable hematoma the following day. However, there was in the inguinal region and the subinguinal region. The patient's  neurological status the following day remained stable with no new motor or coordination or speech difficulties. IV heparin was stopped and the patient was switched to aspirin 325 mg a day, and Plavix 75 mg a day. She was kept in the neuro ICU on account of her medical issues being addressed to by ID. She continued to be stable neurologically and eating normally. The patient underwent ultrasound of the right groin which showed no evidence of a pseudoaneurysm or a large hematoma. A relatively small hematoma was noted. The patient's distal pulses remained palpable and as described earlier. She was then referred back to internal medicine for management of her medical issues. Patient was instructed to maintain adequate hydration by drinking water and taking her antiplatelets. She will be seen in follow-up in the clinic in about 2-3 weeks. Questions were answered to her satisfaction. Patient will be back to internal medicine service. 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 Electronically Signed   By: Luanne Bras M.D.   On: 03/16/2017 12:12   Ir Angio Vertebral Sel Subclavian Innominate Uni R Mod Sed  Result Date: 03/17/2017 INDICATION: Severe headaches. Workup revealed presence of multiple intracranial aneurysms. EXAM: BILATERAL COMMON CAROTID ARTERIOGRAMS, RIGHT VERTEBRAL ARTERY ANGIOGRAM, FOLLOWED BY ENDOVASCULAR TREATMENT OF LOBULATED RIGHT INTERNAL CAROTID ARTERY PARAOPHTHALMIC REGION ANEURYSM WITH PIPELINE FLOW DIVERTER DEVICE. MEDICATIONS: Ancef 2 g IV antibiotic was administered within 1 hour of the procedure. ANESTHESIA/SEDATION: Mac anesthesia followed by general anesthesia by  the Department of Anesthesiology at [place]:  Isovue 300 approximately 120 mL. FLUOROSCOPY TIME:  Fluoroscopy Time: 50 minutes  42 seconds (3768 mGy). COMPLICATIONS: None immediate. PROCEDURE: Informed consent was obtained from the patient following explanation of the procedure, risks, benefits and alternatives. The patient understands, agrees and consents for the procedure. All questions were addressed. A time out was performed prior to the initiation of the procedure. Maximal barrier sterile technique utilized including caps, mask, sterile gowns, sterile gloves, large sterile drape, hand hygiene, and Betadine prep. The right groin was prepped and draped in the usual sterile fashion. Thereafter using modified Seldinger technique, transfemoral access into right common femoral artery was obtained without difficulty. Over a 0.035 inch guidewire, a 5 French Pinnacle sheath was inserted. Through this, and also over a 0.035 inch guidewire a 5 [place] JB 1 catheter was advanced to the aortic arch region and selectively positioned in the innominate artery, right common carotid artery and left common carotid artery. FINDINGS: The left common carotid arteriogram demonstrates the left external carotid artery and its major branches to be widely patent. The left internal carotid artery at the bulb to the cranial skull base opacifies normally. The petrous, the cavernous and supraclinoid segments demonstrate wide patency. 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 This measures approximately 5.7 mm x 4 mm on the oblique AP projection, and 8 mm x 3.4 mm on the lateral projection. The left middle cerebral artery and the left anterior cerebral artery opacify into the capillary and venous phases. Prompt opacification via the anterior communicating artery of the right anterior cerebral artery A2 segment and distally is noted. The innominate artery injection demonstrates the origin of  the right common carotid artery and the right subclavian arteries to be widely patent. The right vertebral artery at its origin and its visualized cervical portion is seen to opacify to the cranial skull base. On lateral projection, wide patency is seen of the right vertebrobasilar junction and the right posterior-inferior cerebellar artery. The opacified portion of the right vertebral artery on the lateral projection demonstrates wide patency into the distal basilar artery. The right common carotid arteriogram demonstrates the right external carotid artery and its major branches to be widely patent. The right internal carotid artery at the bulb to the cranial skull base opacifies normally. The petrous, the cavernous and the supraclinoid segments demonstrate wide patency. A right posterior communicating artery is seen opacifying the right posterior cerebral artery distribution. The right middle cerebral artery is seen to opacify into the capillary and venous phases. Hypoplastic to aplastic right anterior cerebral A1 segment is also noted. A complex bilobed configuration emanating from the right internal carotid artery in the paraophthalmic region is noted. A 3D rotational arteriogram was then performed centered over this area. 3D reconstruction images were then performed on a separate workstation. These revealed the presence of an aneurysm at the level of the ophthalmic artery with a second hemorrhage projecting laterally with a wide-base. A dysplastic appearing right internal carotid artery is seen at this level. The right middle cerebral artery and the right anterior cerebral artery opacify into the capillary and venous phases. Measurements were performed of the right internal carotid artery distal to and also proximal to the landing zones of the planned pipeline flex placement device proximal to the right posterior communicating artery. The  diameter of the distal landing zone was measured at 3.5 mm and the  proximal landing zone as approximately 4.5 mm. The length to be covered was measured at approximately 20 mm. It was decided to use a 4 mm x 16 mm pipeline flex flow diverter device. The diagnostic JB 1 catheter was exchanged over a 0.035 inch 300 cm Rosen exchange guidewire for a 6 French 80 cm Cook shuttle sheath using biplane roadmap technique and constant fluoroscopic guidance. Good aspiration was obtained from the hub of the [place] shuttle sheath using biplane roadmap technique and constant fluoroscopic guidance. A gentle contrast injection demonstrated no evidence of spasms, dissections or of intraluminal filling defects. Through the [facility] shuttle sheath, a 115 cm 5 [place] Catalyst guide catheter was then advanced over 0.035 inch Roadrunner guidewire using biplane roadmap technique and constant fluoroscopic guidance. The 5 [place] Catalyst guide catheter was advanced to the distal cervical left ICA. The guidewire was removed. Good aspiration obtained from the hub of the 5 [place] guide catheter. A gentle contrast injection demonstrated no evidence of spasms, dissections or of intraluminal filling defects. A control arteriogram performed centered intracranially demonstrated no changes intracranially. At this time the [place] shuttle sheath had been advanced to the mid cervical segment of the right internal carotid artery. At this time a 027 Phenom microcatheter was advanced over a 0.014 inch Softip Synchro micro guidewire to the distal end of the 5 [place] guide catheter. With the micro guidewire leading with a J-tip configuration to avoid dissections or inducing spasm, access was obtained past the 2 wide-based aneurysm into the supraclinoid right ICA followed by the microcatheter. The micro guidewire was then advanced into the M2 region of the inferior division of the right middle cerebral artery followed by the microcatheter. The guidewire was removed. Good aspiration was obtained from the hub of the  Phenom microcatheter. A gentle contrast injection demonstrated safe positioning of the tip of the microcatheter. This was then connected to continuous heparinized saline infusion. A 4 mm x 16 mm pipeline flex device was then advanced using biplane roadmap technique and constant fluoroscopic guidance to the distal end of the microcatheter. The O ring on the delivery system was then loosened. With slight forward gentle traction with the right hand on the delivery micro guidewire, with the left hand the delivery microcatheter was gently retrieved unsheathing the distal wire and then a few mm of the distal portion of the device. Once the cigar-shaped configuration had opened up in the left middle cerebral artery the combination was then retracted to the left internal carotid artery supraclinoid segment and just proximal to the origin of the left posterior communicating artery. Once there, using the push and pull technique followed by fluffing and also advancing the wire to deploy, the device was utilized under constant fluoroscopic guidance to deploy the distal and then the proximal portion of the device. Once deployed the distal portion of the device was noted to be just proximal to the right posterior communicating artery, with the proximal being in the mid horizontal proximal cavernous segment. The microcatheter was then gently advanced over the wire to the right middle cerebral artery. The delivery micro guidewire was then retrieved without difficulty. A control arteriogram performed through the 5 [place] Catalyst guide catheter demonstrated excellent apposition and coverage distally and also proximally. A control arteriogram performed following removal of the microcatheter into the right internal carotid artery, however, demonstrated complete patency of the device which was now noted  to be just distal to the origins of the necks of the 2 aneurysms distal to the ophthalmic artery. It was then felt to provide more  coverage at the neck of the aneurysm and distally with improved diversion, a second pipeline device measuring 4 mm x 12 mm was advanced to the distal end of the Phenom microcatheter which was now again positioned in the M1 M2 region of the inferior division of the right middle cerebral artery. Again the entire system was then straightened. With slight forward gentle traction with the right hand on the delivery micro guidewire, the microcatheter was gently retrieved unsheathing the distal wire and then the part of the device distally. Once opened, the combination was retrieved such that the distal portion of the device was now at the level of the posterior communicating artery. Thereafter, again using the advancement of the micro guidewire to deploy the device, and the fluffing technique to insure apposition of the device to the parent artery were used. The tip of the microcatheter was maintained centrally within the parent vessel lumen. Once completely deployed, a control arteriogram performed through the 5 [place] guide catheter in the right internal carotid artery demonstrated excellent coverage distally and proximally. There was now stasis within the 2 aneurysms. The microcatheter was then gently retrieved under constant fluoroscopic guidance to ensure no movement of the devices. None was observed. Control arteriograms were then performed at 15 and 30 minutes post deployment of the second device. These continued to demonstrate safe apposition, and maintaining the distal coverage and the proximal coverage of the aneurysms. Throughout the procedure, the patient's blood pressure and neurological status remained stable. The patient's ACT was maintained in the region of approximately 200 seconds. A final control arteriogram performed through the [place] shuttle sheath in the right internal carotid artery continued to demonstrate stasis within the aneurysms in the paraophthalmic region. The distal portion of the  second device was seen proximal to the right posterior communicating artery with excellent coverage of the neck of the aneurysms. No intraluminal filling defects or occlusions were seen. The 6 [place] Cook shuttle sheath was then retrieved into the abdominal aorta and exchanged over a J-tip guidewire for a 6 [place] Pinnacle sheath. This in turn was then removed with the successful application of an Angio-Seal closure device. At the end of the procedure, the patient's right groin appeared soft without evidence of a hematoma, or of bleeding. The distal pulses remained palpable in the dorsalis pedis regions, and Dopplerable in the posterior tibial regions bilaterally unchanged from prior to the procedure. The patient's general anesthesia was then reversed and the patient was extubated without difficulty. Upon recovery the patient demonstrated no new neurological signs or symptoms. This remain unchanged compared to prior to the procedure. She was then transported to the PACU and the neuro ICU to continue on low-dose IV heparin and close monitoring of her blood pressure and neurological status. Throughout the night the patient's neurological status remained stable. The patient reportedly had some ecchymosis at the groin puncture site on account of her persistent moving despite multiple attempts to convince her to keep her right leg straight. There was no discrete palpable hematoma the following day. However, there was in the inguinal region and the subinguinal region. The patient's neurological status the following day remained stable with no new motor or coordination or speech difficulties. IV heparin was stopped and the patient was switched to aspirin 325 mg a day, and Plavix 75 mg a day. She  was kept in the neuro ICU on account of her medical issues being addressed to by ID. She continued to be stable neurologically and eating normally. The patient underwent ultrasound of the right groin which showed no evidence of a  pseudoaneurysm or a large hematoma. A relatively small hematoma was noted. The patient's distal pulses remained palpable and as described earlier. She was then referred back to internal medicine for management of her medical issues. Patient was instructed to maintain adequate hydration by drinking water and taking her antiplatelets. She will be seen in follow-up in the clinic in about 2-3 weeks. Questions were answered to her satisfaction. Patient will be back to internal medicine service. 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 Electronically Signed   By: Luanne Bras M.D.   On: 03/16/2017 12:12    Microbiology: Recent Results (from the past 240 hour(s))  Blood culture (routine x 2)     Status: Abnormal   Collection Time: 03/12/17  2:35 PM  Result Value Ref Range Status   Specimen Description BLOOD LEFT ARM  Final   Special Requests   Final    BOTTLES DRAWN AEROBIC AND ANAEROBIC Blood Culture adequate volume   Culture  Setup Time   Final    GRAM POSITIVE COCCI IN CLUSTERS ANAEROBIC BOTTLE ONLY CRITICAL RESULT CALLED TO, READ BACK BY AND VERIFIED WITH: N. Batchelder Pharm.D. 12:25 03/13/17 ([email or web address])    Culture METHICILLIN RESISTANT STAPHYLOCOCCUS AUREUS (A)  Final   Report Status 03/15/2017 FINAL  Final   Organism ID, Bacteria METHICILLIN RESISTANT STAPHYLOCOCCUS AUREUS  Final      Susceptibility   Methicillin resistant staphylococcus aureus - MIC*    CIPROFLOXACIN >=8 RESISTANT Resistant     ERYTHROMYCIN >=8 RESISTANT Resistant     GENTAMICIN <=0.5 SENSITIVE Sensitive     OXACILLIN RESISTANT Resistant     TETRACYCLINE <=1 SENSITIVE Sensitive     VANCOMYCIN 1 SENSITIVE Sensitive     TRIMETH/SULFA <=10 SENSITIVE Sensitive     CLINDAMYCIN >=8 RESISTANT Resistant     RIFAMPIN <=0.5 SENSITIVE Sensitive      Inducible Clindamycin NEGATIVE Sensitive     * METHICILLIN RESISTANT STAPHYLOCOCCUS AUREUS  Blood Culture ID Panel (Reflexed)     Status: Abnormal   Collection Time: 03/12/17  2:35 PM  Result Value Ref Range Status   Enterococcus species NOT DETECTED NOT DETECTED Final   Listeria monocytogenes NOT DETECTED NOT DETECTED Final   Staphylococcus species DETECTED (A) NOT DETECTED Final    Comment: CRITICAL RESULT CALLED TO, READ BACK BY AND VERIFIED WITH: N. Batchelder Pharm.D. 12:25 03/13/17 ([email or web address])    Staphylococcus aureus DETECTED (A) NOT DETECTED Final    Comment: Methicillin (oxacillin)-resistant Staphylococcus aureus (MRSA). MRSA is predictably resistant to beta-lactam antibiotics (except ceftaroline). Preferred therapy is vancomycin unless clinically contraindicated. Patient requires contact precautions if  hospitalized. CRITICAL RESULT CALLED TO, READ BACK BY AND VERIFIED WITH: N. Batchelder Pharm.D. 12:25 03/13/17 (wilsonm)    Methicillin resistance DETECTED (A) NOT DETECTED Final    Comment: CRITICAL RESULT CALLED TO, READ BACK BY AND VERIFIED WITH: N. Batchelder Pharm.D. 12:25 03/13/17 (wilsonm)    Streptococcus species NOT DETECTED NOT DETECTED Final   Streptococcus agalactiae NOT DETECTED NOT DETECTED Final   Streptococcus pneumoniae NOT DETECTED NOT DETECTED Final   Streptococcus pyogenes NOT DETECTED NOT DETECTED Final   Acinetobacter baumannii NOT DETECTED NOT DETECTED Final  Enterobacteriaceae species NOT DETECTED NOT DETECTED Final   Enterobacter cloacae complex NOT DETECTED NOT DETECTED Final   Escherichia coli NOT DETECTED NOT DETECTED Final   Klebsiella oxytoca NOT DETECTED NOT DETECTED Final   Klebsiella pneumoniae NOT DETECTED NOT DETECTED Final   Proteus species NOT DETECTED NOT DETECTED Final   Serratia marcescens NOT DETECTED NOT DETECTED Final   Haemophilus influenzae NOT DETECTED NOT DETECTED Final   Neisseria meningitidis NOT DETECTED NOT DETECTED Final    Pseudomonas aeruginosa NOT DETECTED NOT DETECTED Final   Candida albicans NOT DETECTED NOT DETECTED Final   Candida glabrata NOT DETECTED NOT DETECTED Final   Candida krusei NOT DETECTED NOT DETECTED Final   Candida parapsilosis NOT DETECTED NOT DETECTED Final   Candida tropicalis NOT DETECTED NOT DETECTED Final  Blood culture (routine x 2)     Status: None   Collection Time: 03/12/17  2:45 PM  Result Value Ref Range Status   Specimen Description BLOOD RIGHT HAND  Final   Special Requests   Final    BOTTLES DRAWN AEROBIC AND ANAEROBIC Blood Culture adequate volume   Culture NO GROWTH 5 DAYS  Final   Report Status 03/17/2017 FINAL  Final  MRSA PCR Screening     Status: Abnormal   Collection Time: 03/13/17  4:29 PM  Result Value Ref Range Status   MRSA by PCR POSITIVE (A) NEGATIVE Final    Comment:        The GeneXpert MRSA Assay (FDA approved for NASAL specimens only), is one component of a comprehensive MRSA colonization surveillance program. It is not intended to diagnose MRSA infection nor to guide or monitor treatment for MRSA infections. CRITICAL RESULT CALLED TO, READ BACK BY AND VERIFIED WITH: Melburn Hake, RN AT 2015 ON 03/13/17 BY C. JESSUP, MLT.   Culture, blood (Routine X 2) w Reflex to ID Panel     Status: None (Preliminary result)   Collection Time: 03/14/17 11:41 AM  Result Value Ref Range Status   Specimen Description BLOOD LEFT FOREARM  Final   Special Requests IN PEDIATRIC BOTTLE Blood Culture adequate volume  Final   Culture NO GROWTH 4 DAYS  Final   Report Status PENDING  Incomplete  Culture, blood (Routine X 2) w Reflex to ID Panel     Status: None (Preliminary result)   Collection Time: 03/14/17 11:41 AM  Result Value Ref Range Status   Specimen Description BLOOD LEFT ANTECUBITAL  Final   Special Requests IN PEDIATRIC BOTTLE Blood Culture adequate volume  Final   Culture NO GROWTH 4 DAYS  Final   Report Status PENDING  Incomplete     Labs: Basic  Metabolic Panel: Recent Labs  Lab 03/12/17 1324 03/12/17 1400 03/13/17 0603 03/14/17 0423 03/17/17 0527  NA 140 136 141 140 142  K 4.4 4.1 4.4 4.1 4.2  CL 103 103 111 109 108  CO2  --  25 22 21* 24  GLUCOSE 100* 92 99 118* 121*  BUN _0 CREATININE 0.90 0.93 0.75 0.70 0.70  CALCIUM  --  8.3* 8.6* 8.6* 9.0   Liver Function Tests: Recent Labs  Lab 03/12/17 1400  AST 18  ALT 17  ALKPHOS 55  BILITOT 0.4  PROT 5.1*  ALBUMIN 3.0*   No results for input(s): LIPASE, AMYLASE in the last 168 hours. No results for input(s): AMMONIA in the last 168 hours. CBC: Recent Labs  Lab 03/12/17 1324 03/12/17 1400 03/13/17 0603 03/14/17 0423  WBC  --  8.6 7.9 13.4*  NEUTROABS  --  5.0  --  10.6*  HGB 13.6 11.3* 11.8* 10.2*  HCT 40.0 35.6* 36.8 30.5*  MCV  --  91.3 90.9 89.7  PLT  --  206 218 237   Cardiac Enzymes: No results for input(s): CKTOTAL, CKMB, CKMBINDEX, TROPONINI in the last 168 hours. BNP: BNP (last 3 results) No results for input(s): BNP in the last 8760 hours.  ProBNP (last 3 results) No results for input(s): PROBNP in the last 8760 hours.   Allergies as of 03/18/2017   No Known Allergies     Medication List    TAKE these medications   aspirin 325 MG tablet Take 1 tablet (325 mg total) by mouth daily.   atorvastatin 40 MG tablet Commonly known as:  LIPITOR Take 1 tablet (40 mg total) by mouth every evening.   clonazePAM 0.5 MG tablet Commonly known as:  KLONOPIN Take 1 tablet (0.5 mg total) by mouth 2 (two) times daily. 1 in the morning and 1 midday   clopidogrel 75 MG tablet Commonly known as:  PLAVIX Take 75 mg by mouth daily.   diphenhydrAMINE 25 MG tablet Commonly known as:  BENADRYL Take 25 mg by mouth daily.   DOCQLACE 100 MG capsule Generic drug:  docusate sodium TAKE 1 TO 2 CAPSULES BY MOUTH TWICE A DAY AS NEEDED FOR MILD CONSTIPATION   gabapentin 100 MG capsule Commonly known as:  NEURONTIN Take 100 mg by mouth 3 (three)  times daily.   levothyroxine 150 MCG tablet Commonly known as:  SYNTHROID, LEVOTHROID TAKE 1 TABLET BY MOUTH DAILY BEFORE BREAKFAST   linaclotide 145 MCG Caps capsule Commonly known as:  LINZESS Take 1 capsule (145 mcg total) by mouth daily before breakfast.   OLANZapine 20 MG tablet Commonly known as:  ZYPREXA Take 20 mg by mouth at bedtime.   Oxycodone HCl 10 MG Tabs Take 1 tablet (10 mg total) by mouth every 6 (six) hours as needed (pain).   tiZANidine 4 MG tablet Commonly known as:  ZANAFLEX Take 1 tablet (4 mg total) by mouth every 8 (eight) hours as needed for muscle spasms.   vancomycin IVPB Inject 1,000 mg into the vein every 12 (twelve) hours for 10 days. Indication:  MRSA bacteremia Last Day of Therapy:  03/27/17 Labs - Sunday/Monday:  CBC/D, BMP, and vancomycin trough. Labs - Thursday:  BMP and vancomycin trough Labs - Every other week:  ESR and CRP   zolpidem 5 MG tablet Commonly known as:  AMBIEN Take 1 tablet (5 mg total) by mouth at bedtime as needed for sleep.            Home Infusion Instuctions  (From admission, onward)        Start     Ordered   03/17/17 0000  Home infusion instructions [facility] May follow [facility] Dosing Protocol; May administer Cathflo as needed to maintain patency of vascular access device.; Flushing of vascular access device: per [facility] Protocol: 0.9% NaCl pre/post medica...    Question Answer Comment  Instructions May follow [place] Dosing Protocol   Instructions May administer Cathflo as needed to maintain patency of vascular access device.   Instructions Flushing of vascular access device: per [facility] Protocol: 0.9% NaCl pre/post medication administration and prn patency; Heparin 100 u/ml, 40m for implanted ports and Heparin 10u/ml, 544mfor all other central venous catheters.   Instructions May follow [facility] Anaphylaxis Protocol for First Dose Administration in the home: 0.9%  NaCl at 25-50 ml/hr to maintain IV access  for protocol meds. Epinephrine 0.3 ml IV/IM PRN and Benadryl 25-50 IV/IM PRN s/s of anaphylaxis.   Instructions [facility] Infusion Coordinator (RN) to assist per patient IV care needs in the home PRN.      03/17/17 1648       CBG: Recent Labs  Lab 03/13/17 0620 03/13/17 1357 03/13/17 2108  GLUCAP 106* 138* 117*    Signed:  Velvet Bathe MD.  [facility] 03/18/2017, 4:48 PM

## 2017-03-18 NOTE — Progress Notes (Signed)
RN attempted to call [facility] for report but no answer at this time. RN left message with recording with call back number at this time. Awaiting for [facility].  Ave Filter, RN

## 2017-03-18 NOTE — Progress Notes (Signed)
CM spoke to patient about her bed offers. She has offers from [facility] and [facility]. [facility] does not have any beds today. Patient made aware and is agreeable to go to [facility]. She asked that her daughter be called. CM called Sharyn Lull [phone] and informed her of the above. She was in agreement with the plan. CSW updated.

## 2017-03-18 NOTE — Care Management Note (Signed)
Case Management Note  Patient Details  Name: Kelsey Hanna MRN: [id number] Date of Birth: 1950/03/07  Subjective/Objective:                    Action/Plan: Pt discharging to [facility] today. No further needs per CM.   Expected Discharge Date:  03/18/17               Expected Discharge Plan:  [place]  In-House Referral:  Clinical Social Work  Discharge planning Services  CM Consult  Post Acute Care Choice:    Choice offered to:     DME Arranged:    DME Agency:     HH Arranged:    [place] Agency:     Status of Service:  Completed, signed off  If discussed at [employer], dates discussed:    Additional Comments:  Pollie Friar, RN 03/18/2017, 4:08 PM

## 2017-03-18 NOTE — Progress Notes (Signed)
Pt discharged to [facility] via [facility] called and updated on pt's status.  Pt left in stable condition.

## 2017-03-18 NOTE — Clinical Social Work Placement (Signed)
   CLINICAL SOCIAL WORK PLACEMENT  NOTE  Date:  03/18/2017  Patient Details  Name: Kelsey Hanna MRN: [id number] Date of Birth: 04-26-1950  Clinical Social Work is seeking post-discharge placement for this patient at the [place] level of care (*CSW will initial, date and re-position this form in  chart as items are completed):  Yes   Patient/family provided with [facility] Work Department's list of facilities offering this level of care within the geographic area requested by the patient (or if unable, by the patient's family).  Yes   Patient/family informed of their freedom to choose among providers that offer the needed level of care, that participate in Medicare, Medicaid or managed care program needed by the patient, have an available bed and are willing to accept the patient.      Patient/family informed of [facility]'s ownership interest in [facility] and [facility], as well as of the fact that they are under no obligation to receive care at these facilities.  PASRR submitted to EDS on       PASRR number received on       Existing PASRR number confirmed on 03/18/17     FL2 transmitted to all facilities in geographic area requested by pt/family on 03/18/17     FL2 transmitted to all facilities within larger geographic area on       Patient informed that his/her managed care company has contracts with or will negotiate with certain facilities, including the following:            Patient/family informed of bed offers received.  Patient chooses bed at [facility]     Physician recommends and patient chooses bed at      Patient to be transferred to [facility] on 03/18/17.  Patient to be transferred to facility by PTAR     Patient family notified on 03/18/17 of transfer.  Name of family member notified:  spoke with daughter via phone     PHYSICIAN       Additional Comment:     _______________________________________________ Wende Neighbors, LCSW 03/18/2017, 4:12 PM

## 2017-03-18 NOTE — Progress Notes (Signed)
Clinical Social Worker facilitated patient discharge including contacting patient family and facility to confirm patient discharge plans.  Clinical information faxed to facility and family agreeable with plan.  CSW arranged ambulance transport via [facility] to [facility] .  RN to call [phone] (pt will go in room 102) report prior to discharge.  Clinical Social Worker will sign off for now as social work intervention is no longer needed. Please consult [place] again if new need arises.  Rhea Pink, MSW, [place]

## 2017-03-19 DIAGNOSIS — M6281 Muscle weakness (generalized): Secondary | ICD-10-CM | POA: Diagnosis not present

## 2017-03-19 DIAGNOSIS — R278 Other lack of coordination: Secondary | ICD-10-CM | POA: Diagnosis not present

## 2017-03-19 DIAGNOSIS — R2689 Other abnormalities of gait and mobility: Secondary | ICD-10-CM | POA: Diagnosis not present

## 2017-03-19 LAB — CULTURE, BLOOD (ROUTINE X 2)
CULTURE: NO GROWTH
CULTURE: NO GROWTH
Special Requests: ADEQUATE
Special Requests: ADEQUATE

## 2017-03-20 DIAGNOSIS — M533 Sacrococcygeal disorders, not elsewhere classified: Secondary | ICD-10-CM | POA: Diagnosis not present

## 2017-03-20 DIAGNOSIS — R2681 Unsteadiness on feet: Secondary | ICD-10-CM | POA: Diagnosis not present

## 2017-03-23 DIAGNOSIS — M533 Sacrococcygeal disorders, not elsewhere classified: Secondary | ICD-10-CM | POA: Diagnosis not present

## 2017-03-23 DIAGNOSIS — R2681 Unsteadiness on feet: Secondary | ICD-10-CM | POA: Diagnosis not present

## 2017-03-24 ENCOUNTER — Ambulatory Visit: Payer: Medicare Other | Admitting: Neurology

## 2017-03-25 ENCOUNTER — Ambulatory Visit: Payer: Self-pay | Admitting: Nurse Practitioner

## 2017-03-25 DIAGNOSIS — M533 Sacrococcygeal disorders, not elsewhere classified: Secondary | ICD-10-CM | POA: Diagnosis not present

## 2017-03-25 DIAGNOSIS — Z8673 Personal history of transient ischemic attack (TIA), and cerebral infarction without residual deficits: Secondary | ICD-10-CM | POA: Diagnosis not present

## 2017-03-25 DIAGNOSIS — M549 Dorsalgia, unspecified: Secondary | ICD-10-CM | POA: Diagnosis not present

## 2017-03-25 DIAGNOSIS — Z9889 Other specified postprocedural states: Secondary | ICD-10-CM | POA: Diagnosis not present

## 2017-03-25 DIAGNOSIS — R2681 Unsteadiness on feet: Secondary | ICD-10-CM | POA: Diagnosis not present

## 2017-04-14 ENCOUNTER — Other Ambulatory Visit: Payer: Self-pay | Admitting: Nurse Practitioner

## 2017-04-14 NOTE — Telephone Encounter (Signed)
Copied from [place] [id number]. Topic: Quick Communication - Rx Refill/Question >> Apr 14, 2017  2:49 PM Boyd Kerbs wrote: Medication:   zolpidem (AMBIEN) 5 MG tablet Oxycodone HCl 10 MG TABS  Pharmacy sent refill requests in with no response.   Has the patient contacted their pharmacy? Yes.     (Agent: If no, request that the patient contact the pharmacy for the refill.)   Preferred Pharmacy (with phone number or street name): [place], [address] [place] [address] Phone: [phone] Fax: [phone]  Please call pt. When sent in.   Agent: Please be advised that RX refills may take up to 3 business days. We ask that you follow-up with your pharmacy.

## 2017-04-15 DIAGNOSIS — M961 Postlaminectomy syndrome, not elsewhere classified: Secondary | ICD-10-CM | POA: Diagnosis not present

## 2017-04-15 DIAGNOSIS — M545 Low back pain: Secondary | ICD-10-CM | POA: Diagnosis not present

## 2017-04-15 DIAGNOSIS — Z79891 Long term (current) use of opiate analgesic: Secondary | ICD-10-CM | POA: Diagnosis not present

## 2017-04-15 DIAGNOSIS — G894 Chronic pain syndrome: Secondary | ICD-10-CM | POA: Diagnosis not present

## 2017-04-15 NOTE — Telephone Encounter (Signed)
Refill of Ambien and Oxycodone  LOV 03/04/17  L. [employer] verified

## 2017-04-17 NOTE — Telephone Encounter (Signed)
Pt wants to know why the Kelsey Hanna was denied? Please call patient at [phone]

## 2017-04-17 NOTE — Addendum Note (Signed)
Addended by: Matilde Sprang on: 04/17/2017 12:29 PM   Modules accepted: Orders

## 2017-04-17 NOTE — Telephone Encounter (Signed)
Patient called to inform that she will need an OV in order to receive medication refills, appointment scheduled for Wednesday, March 6th at 1500 with Jodi Mourning, NP.

## 2017-04-22 ENCOUNTER — Encounter: Payer: Self-pay | Admitting: Family

## 2017-04-22 ENCOUNTER — Ambulatory Visit (INDEPENDENT_AMBULATORY_CARE_PROVIDER_SITE_OTHER): Payer: Medicare Other | Admitting: Family

## 2017-04-22 VITALS — BP 124/82 | HR 105 | Temp 98.4°F | Ht 63.0 in | Wt 204.1 lb

## 2017-04-22 DIAGNOSIS — I340 Nonrheumatic mitral (valve) insufficiency: Secondary | ICD-10-CM

## 2017-04-22 DIAGNOSIS — I639 Cerebral infarction, unspecified: Secondary | ICD-10-CM

## 2017-04-22 DIAGNOSIS — I5032 Chronic diastolic (congestive) heart failure: Secondary | ICD-10-CM

## 2017-04-22 MED ORDER — DOCUSATE SODIUM 100 MG PO CAPS: ORAL_CAPSULE | ORAL | 2 refills | Status: DC

## 2017-04-22 MED ORDER — ASPIRIN 325 MG PO TABS: 325.0000 mg | ORAL_TABLET | Freq: Every day | ORAL | 3 refills | Status: DC

## 2017-04-22 MED ORDER — LORATADINE 10 MG PO TABS: 10.0000 mg | ORAL_TABLET | Freq: Every day | ORAL | 3 refills | Status: DC

## 2017-04-23 ENCOUNTER — Other Ambulatory Visit: Payer: Self-pay | Admitting: Family

## 2017-04-23 NOTE — Progress Notes (Signed)
Kelsey Hanna is a 67 y.o. female with the following history as recorded in EpicCare:  Patient Active Problem List   Diagnosis Date Noted  . MRSA bacteremia   . Brain aneurysm 03/13/2017  . Internal carotid aneurysm   . Slurred speech 03/12/2017  . Hypotension 03/12/2017  . Dysarthria   . Left sided numbness   . Acute left-sided muscle weakness 01/24/2017  . 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 Intracranial aneurysm 01/24/2017  . Encounter for medication review 02/07/2016  . Hypoxia, sleep related 09/07/2015  . Constipation 07/30/2015  . Dizziness 07/30/2015  . Chronic diastolic CHF (congestive heart failure) ([place]) 06/26/2015  . Bilateral lower extremity edema 06/26/2015  . Mitral regurgitation 05/17/2015  . Accidental drug overdose 05/17/2015  . Respiratory failure with hypoxia and hypercapnia ([place]) 05/09/2015  . OSA (obstructive sleep apnea) 05/09/2015  . Overdose 05/09/2015  . Chronic back pain 03/20/2015  . Right shoulder pain 11/14/2014  . Insomnia 11/14/2014  . Psychoses ([place])   . Bacteremia   . Pedal edema 09/12/2014  . Bipolar disorder, current episode manic without psychotic features, severe ([place])   . Bipolar disorder ([place]) 09/05/2014  . Bipolar affective disorder, current episode manic with psychotic symptoms ([place]) 09/05/2014  . Bipolar affective disorder, manic ([place]) 09/03/2014  . Encounter for preadmission testing   . Head revolving around 07/27/2014  . BP (high blood pressure) 05/23/2014  . AF (paroxysmal atrial fibrillation) ([place]) 05/23/2014  . Type 2 diabetes mellitus ([place]) 04/27/2014  . Essential hypertension 04/27/2014  . Hypothyroidism 04/27/2014  . Tobacco abuse 04/27/2014  . Bipolar I disorder ([place]) 04/20/2014  . Acute encephalopathy 04/18/2014  . Acute respiratory failure ([place])   . Encounter for feeding tube placement   . Shortness of breath   . Gout 08/25/2013  . 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 HLD (hyperlipidemia) 08/30/2009     Current Outpatient Medications  Medication Sig Dispense Refill  . aspirin 325 MG tablet Take 1 tablet (325 mg total) by mouth daily. 30 tablet 3  . atorvastatin (LIPITOR) 40 MG tablet Take 1 tablet (40 mg total) by mouth every evening. 90 tablet 1  . clonazePAM (KLONOPIN) 0.5 MG tablet Take 1 tablet (0.5 mg total) by mouth 2 (two) times daily. 1 in the morning and 1 midday 20 tablet 0  . clopidogrel (PLAVIX) 75 MG tablet Take 75 mg by mouth daily.    . cyclobenzaprine (FLEXERIL) 5 MG tablet 2 (two) times daily.     Marland Kitchen docusate sodium (DOCQLACE) 100 MG capsule TAKE 1 TO 2 CAPSULES BY MOUTH TWICE A DAY AS NEEDED FOR MILD CONSTIPATION 60 capsule 2  . gabapentin (NEURONTIN) 100 MG capsule Take 100 mg by mouth 3 (three) times daily.    Marland Kitchen levothyroxine (SYNTHROID, LEVOTHROID) 150 MCG tablet TAKE 1 TABLET BY MOUTH DAILY BEFORE BREAKFAST 90 tablet 0  . linaclotide (LINZESS) 145 MCG CAPS capsule Take 1 capsule (145 mcg total) by mouth daily before breakfast. 90 capsule 1  . OLANZapine (ZYPREXA) 20 MG tablet Take 20 mg by mouth at bedtime.     . Oxycodone HCl 10 MG TABS Take 1 tablet (10 mg total) by mouth every 6 (six) hours as needed (pain). (Patient taking differently: Take 10 mg by mouth 5 (five) times daily as needed (pain). ) 15 tablet 0  . zolpidem (AMBIEN) 5 MG tablet Take 1 tablet (5 mg total) by mouth at bedtime as needed for sleep. 30 tablet 1  . loratadine (CLARITIN) 10 MG tablet Take  1 tablet (10 mg total) by mouth daily. 30 tablet 3  . tiZANidine (ZANAFLEX) 4 MG tablet Take 1 tablet (4 mg total) by mouth every 8 (eight) hours as needed for muscle spasms. (Patient not taking: Reported on 04/22/2017) 30 tablet 0   No current facility-administered medications for this visit.     Allergies: Patient has no known allergies.  Past Medical History:  Diagnosis Date  . Anxiety   . Bipolar 1 disorder ([place])   . Cancer [facility])    cancer of uterus...no chemo or radiation  . Chicken pox   . Depression    . Diabetes mellitus    dx within last yr....just takes pills  . Dyslipidemia   . Hypothyroid   . Retinal detachment   . Sleep apnea     Past Surgical History:  Procedure Laterality Date  . ABDOMINAL HYSTERECTOMY    . IR 3D INDEPENDENT WKST  03/13/2017  . IR ANGIO INTRA EXTRACRAN SEL COM CAROTID INNOMINATE UNI L MOD SED  03/13/2017  . IR ANGIO INTRA EXTRACRAN SEL INTERNAL CAROTID UNI R MOD SED  03/13/2017  . IR ANGIO VERTEBRAL SEL SUBCLAVIAN INNOMINATE UNI R MOD SED  03/13/2017  . IR ANGIOGRAM FOLLOW UP STUDY  03/13/2017  . IR CT HEAD LTD  03/13/2017  . IR RADIOLOGIST EVAL & MGMT  02/19/2017  . IR TRANSCATH/EMBOLIZ  03/13/2017  . LUMBAR LAMINECTOMY/DECOMPRESSION MICRODISCECTOMY  07/17/2011   Procedure: LUMBAR LAMINECTOMY/DECOMPRESSION MICRODISCECTOMY;  Surgeon: Sinclair Ship, MD;  Location: [place];  Service: Orthopedics;  Laterality: Left;  Left sided lumbar 4-5 microdisectomy  . RADIOLOGY WITH ANESTHESIA N/A 03/13/2017   Procedure: EMBOLIZATION;  Surgeon: Luanne Bras, MD;  Location: [place];  Service: Radiology;  Laterality: N/A;  . TEE WITHOUT CARDIOVERSION N/A 03/17/2017   Procedure: TRANSESOPHAGEAL ECHOCARDIOGRAM (TEE);  Surgeon: Dorothy Spark, MD;  Location: [facility] ENDOSCOPY;  Service: Cardiovascular;  Laterality: N/A;  . TONSILLECTOMY    . TOTAL ABDOMINAL HYSTERECTOMY W/ BILATERAL SALPINGOOPHORECTOMY      Family History  Problem Relation Age of Onset  . Healthy Mother   . Healthy Father     Social History   Tobacco Use  . Smoking status: Current Every Day Smoker    Packs/day: 0.25    Years: 35.00    Pack years: 8.75    Types: Cigarettes    Last attempt to quit: 07/09/1999    Years since quitting: 17.8  . Smokeless tobacco: Never Used  Substance Use Topics  . Alcohol use: No    Alcohol/week: 0.0 oz    Comment: socially    Subjective:  Patient is accompanied to the office by a nurse from [facility] who is serving as patient's care coordinator. Patient and  nurse readily admit she is not able to manage her own health needs easily and is comfortable with the nurse taking charge. The patient has recently had a stroke in 12/20108 and subsequent surgery for aneurysm in January 2019. According to the nurse, the patient has had worsening confusion, tremors and memory issues. Unfortunately, no neurology follow-up was done after the stroke in December due to the planned aneurysm surgery. Both the patient and nurse are surprised and happy to learn that the patient already has a planned neurology follow-up in April. The patient is also under the care of psychiatry and pain management. Unfortunately, the patient does not know the name of her pain management specialist and it is not documented in her records here.  The nurse plans to do  further research to find out who this provider is and notes she does not need help from our office. The nurse is asking for refills on Aspirin and colace as well as other option for allergies besides Benadryl. The patient gets all her medications from a pharmacy- even OTC drugs.   Objective:  Vitals:   04/22/17 1528  BP: 124/82  Pulse: (!) 105  Temp: 98.4 F (36.9 C)  TempSrc: Oral  SpO2: 98%  Weight: 204 lb 1.3 oz (92.6 kg)  Height: 5\' 3"  (1.6 m)    General: Well developed, well nourished, in no acute distress  Skin : Warm and dry.  Head: Normocephalic and atraumatic  Lungs: Respirations unlabored; clear to auscultation bilaterally without wheeze, rales, rhonchi  CVS exam: normal rate and regular rhythm.  Neurologic: Alert and oriented; speech intact; face symmetrical; moves all extremities well; CNII-XII intact without focal deficit   Assessment:  1. Mitral valve insufficiency, unspecified etiology   2. Chronic diastolic CHF (congestive heart failure) ([place])   3. Cerebrovascular accident (CVA), unspecified mechanism ([place])     Plan:  1. & 2. Patient has not scheduled follow-up with her cardiologist as discussed at [place] in  January 2019; will put in referral for her; refill given on Aspirin; 3. Keep planned follow-up with neurology;   Time spent 30 minutes reviewing notes and developing follow-up/ care plan;   No Follow-up on file.  Orders Placed This Encounter  Procedures  . Ambulatory referral to Cardiology    Referral Priority:   Routine    Referral Type:   Consultation    Referral Reason:   Specialty Services Required    Referred to Provider:   Lelon Perla, MD    Requested Specialty:   Cardiology    Number of Visits Requested:   1    Requested Prescriptions   Signed Prescriptions Disp Refills  . aspirin 325 MG tablet 30 tablet 3    Sig: Take 1 tablet (325 mg total) by mouth daily.  Marland Kitchen docusate sodium (DOCQLACE) 100 MG capsule 60 capsule 2    Sig: TAKE 1 TO 2 CAPSULES BY MOUTH TWICE A DAY AS NEEDED FOR MILD CONSTIPATION  . loratadine (CLARITIN) 10 MG tablet 30 tablet 3    Sig: Take 1 tablet (10 mg total) by mouth daily.

## 2017-04-29 ENCOUNTER — Encounter: Payer: Self-pay | Admitting: General Practice

## 2017-05-02 ENCOUNTER — Encounter (HOSPITAL_COMMUNITY): Payer: Self-pay

## 2017-05-02 ENCOUNTER — Emergency Department (HOSPITAL_COMMUNITY): Payer: Medicare Other

## 2017-05-02 ENCOUNTER — Other Ambulatory Visit: Payer: Self-pay

## 2017-05-02 ENCOUNTER — Inpatient Hospital Stay (HOSPITAL_COMMUNITY)
Admission: EM | Admit: 2017-05-02 | Discharge: 2017-05-05 | DRG: 871 | Disposition: A | Discharge status: Home Health | Payer: Medicare Other | Attending: Internal Medicine | Admitting: Internal Medicine

## 2017-05-02 DIAGNOSIS — A419 Sepsis, unspecified organism: Secondary | ICD-10-CM | POA: Diagnosis present

## 2017-05-02 DIAGNOSIS — F319 Bipolar disorder, unspecified: Secondary | ICD-10-CM | POA: Diagnosis present

## 2017-05-02 DIAGNOSIS — I6523 Occlusion and stenosis of bilateral carotid arteries: Secondary | ICD-10-CM | POA: Diagnosis not present

## 2017-05-02 DIAGNOSIS — J189 Pneumonia, unspecified organism: Secondary | ICD-10-CM

## 2017-05-02 DIAGNOSIS — I480 Paroxysmal atrial fibrillation: Secondary | ICD-10-CM | POA: Diagnosis present

## 2017-05-02 DIAGNOSIS — I110 Hypertensive heart disease with heart failure: Secondary | ICD-10-CM | POA: Diagnosis not present

## 2017-05-02 DIAGNOSIS — E119 Type 2 diabetes mellitus without complications: Secondary | ICD-10-CM | POA: Diagnosis present

## 2017-05-02 DIAGNOSIS — J690 Pneumonitis due to inhalation of food and vomit: Secondary | ICD-10-CM | POA: Diagnosis not present

## 2017-05-02 DIAGNOSIS — R0602 Shortness of breath: Secondary | ICD-10-CM | POA: Diagnosis not present

## 2017-05-02 DIAGNOSIS — R260 Ataxic gait: Secondary | ICD-10-CM | POA: Diagnosis not present

## 2017-05-02 DIAGNOSIS — G4733 Obstructive sleep apnea (adult) (pediatric): Secondary | ICD-10-CM | POA: Diagnosis not present

## 2017-05-02 DIAGNOSIS — R29810 Facial weakness: Secondary | ICD-10-CM | POA: Diagnosis not present

## 2017-05-02 DIAGNOSIS — Z8542 Personal history of malignant neoplasm of other parts of uterus: Secondary | ICD-10-CM

## 2017-05-02 DIAGNOSIS — R200 Anesthesia of skin: Secondary | ICD-10-CM

## 2017-05-02 DIAGNOSIS — G459 Transient cerebral ischemic attack, unspecified: Secondary | ICD-10-CM | POA: Diagnosis not present

## 2017-05-02 DIAGNOSIS — Z79890 Hormone replacement therapy (postmenopausal): Secondary | ICD-10-CM | POA: Diagnosis not present

## 2017-05-02 DIAGNOSIS — Z66 Do not resuscitate: Secondary | ICD-10-CM | POA: Diagnosis not present

## 2017-05-02 DIAGNOSIS — Z90710 Acquired absence of both cervix and uterus: Secondary | ICD-10-CM

## 2017-05-02 DIAGNOSIS — E039 Hypothyroidism, unspecified: Secondary | ICD-10-CM | POA: Diagnosis present

## 2017-05-02 DIAGNOSIS — J9601 Acute respiratory failure with hypoxia: Secondary | ICD-10-CM | POA: Diagnosis not present

## 2017-05-02 DIAGNOSIS — E785 Hyperlipidemia, unspecified: Secondary | ICD-10-CM | POA: Diagnosis not present

## 2017-05-02 DIAGNOSIS — G9340 Encephalopathy, unspecified: Secondary | ICD-10-CM | POA: Diagnosis not present

## 2017-05-02 DIAGNOSIS — I5032 Chronic diastolic (congestive) heart failure: Secondary | ICD-10-CM | POA: Diagnosis present

## 2017-05-02 DIAGNOSIS — R748 Abnormal levels of other serum enzymes: Secondary | ICD-10-CM | POA: Diagnosis not present

## 2017-05-02 DIAGNOSIS — R471 Dysarthria and anarthria: Secondary | ICD-10-CM | POA: Diagnosis present

## 2017-05-02 DIAGNOSIS — Z7902 Long term (current) use of antithrombotics/antiplatelets: Secondary | ICD-10-CM | POA: Diagnosis not present

## 2017-05-02 DIAGNOSIS — F17210 Nicotine dependence, cigarettes, uncomplicated: Secondary | ICD-10-CM | POA: Diagnosis present

## 2017-05-02 DIAGNOSIS — R270 Ataxia, unspecified: Secondary | ICD-10-CM | POA: Diagnosis not present

## 2017-05-02 DIAGNOSIS — E038 Other specified hypothyroidism: Secondary | ICD-10-CM | POA: Diagnosis not present

## 2017-05-02 DIAGNOSIS — Z7982 Long term (current) use of aspirin: Secondary | ICD-10-CM

## 2017-05-02 DIAGNOSIS — R4781 Slurred speech: Secondary | ICD-10-CM | POA: Diagnosis not present

## 2017-05-02 DIAGNOSIS — R0902 Hypoxemia: Secondary | ICD-10-CM | POA: Diagnosis not present

## 2017-05-02 DIAGNOSIS — I6789 Other cerebrovascular disease: Secondary | ICD-10-CM | POA: Diagnosis not present

## 2017-05-02 DIAGNOSIS — N390 Urinary tract infection, site not specified: Secondary | ICD-10-CM | POA: Diagnosis present

## 2017-05-02 LAB — BASIC METABOLIC PANEL
ANION GAP: 12 (ref 5–15)
BUN: 10 mg/dL (ref 6–20)
CHLORIDE: 103 mmol/L (ref 101–111)
CO2: 21 mmol/L — AB (ref 22–32)
Calcium: 8.8 mg/dL — ABNORMAL LOW (ref 8.9–10.3)
Creatinine, Ser: 0.94 mg/dL (ref 0.44–1.00)
GFR calc non Af Amer: >60 mL/min (ref >60)
GLUCOSE: 106 mg/dL — AB (ref 65–99)
POTASSIUM: 4.3 mmol/L (ref 3.5–5.1)
Sodium: 136 mmol/L (ref 135–145)

## 2017-05-02 LAB — URINALYSIS, ROUTINE W REFLEX MICROSCOPIC
Bilirubin Urine: NEGATIVE
GLUCOSE, UA: NEGATIVE mg/dL
KETONES UR: NEGATIVE mg/dL
Leukocytes, UA: NEGATIVE
Nitrite: NEGATIVE
PROTEIN: NEGATIVE mg/dL
Specific Gravity, Urine: 1.005 (ref 1.005–1.030)
pH: 6 (ref 5.0–8.0)

## 2017-05-02 LAB — I-STAT TROPONIN, ED: Troponin i, poc: 0.19 ng/mL (ref 0.00–0.08)

## 2017-05-02 LAB — PROTIME-INR
INR: 1.1
Prothrombin Time: 14.1 seconds (ref 11.4–15.2)

## 2017-05-02 LAB — CBC
HEMATOCRIT: 41 % (ref 36.0–46.0)
HEMOGLOBIN: 12.8 g/dL (ref 12.0–15.0)
MCH: 28.8 pg (ref 26.0–34.0)
MCHC: 31.2 g/dL (ref 30.0–36.0)
MCV: 92.1 fL (ref 78.0–100.0)
Platelets: 233 10*3/uL (ref 150–400)
RBC: 4.45 MIL/uL (ref 3.87–5.11)
RDW: 14 % (ref 11.5–15.5)
WBC: 18.7 10*3/uL — ABNORMAL HIGH (ref 4.0–10.5)

## 2017-05-02 LAB — BRAIN NATRIURETIC PEPTIDE: B Natriuretic Peptide: 402.8 pg/mL — ABNORMAL HIGH (ref 0.0–100.0)

## 2017-05-02 LAB — TSH: TSH: 0.03 u[IU]/mL — AB (ref 0.350–4.500)

## 2017-05-02 LAB — AMMONIA: Ammonia: 36 umol/L — ABNORMAL HIGH (ref 9–35)

## 2017-05-02 LAB — CBG MONITORING, ED: GLUCOSE-CAPILLARY: 117 mg/dL — AB (ref 65–99)

## 2017-05-02 LAB — D-DIMER, QUANTITATIVE: D-Dimer, Quant: 2.58 ug/mL-FEU — ABNORMAL HIGH (ref 0.00–0.50)

## 2017-05-02 MED ORDER — SODIUM CHLORIDE 0.9 % IV BOLUS (SEPSIS)
500.0000 mL | Freq: Once | INTRAVENOUS | Status: AC
  Administered 2017-05-02: 500 mL via INTRAVENOUS

## 2017-05-02 MED ORDER — IOPAMIDOL (ISOVUE-370) INJECTION 76%
INTRAVENOUS | Status: AC
  Administered 2017-05-03: 100 mL
  Filled 2017-05-02: qty 100

## 2017-05-02 MED ORDER — GADOBENATE DIMEGLUMINE 529 MG/ML IV SOLN
18.0000 mL | Freq: Once | INTRAVENOUS | Status: AC | PRN
  Administered 2017-05-02: 18 mL via INTRAVENOUS

## 2017-05-02 NOTE — ED Notes (Signed)
Patient transported to X-ray 

## 2017-05-02 NOTE — ED Notes (Signed)
MRI able to use 22 PIV for study; CT unable to do so. Dr. Sherry Ruffing stating he will go place [place] PIV for CT.

## 2017-05-02 NOTE — ED Triage Notes (Signed)
To room via EMS.  Pt reports when she woke up this morning she has been having difficulty getting up from chair to walk to the bedroom, slurred speech.  Uses walker.  Pt falling to sleep in middle of conversation has to be verbally aroused.  Oxygen sats 88% on RA, increased to 95% on 4L oxygen, CBG 142, HR 104, ST.  Pt reports that left leg making hard to walk.

## 2017-05-02 NOTE — ED Notes (Signed)
Angela Nevin, RN at bedside attempting to establish PIV via [place]; 2 unsuccessful sticks prior to using [place].

## 2017-05-02 NOTE — ED Notes (Signed)
Delay in lab draw,  edp at bedside. 

## 2017-05-02 NOTE — ED Notes (Signed)
Patient transported to MRI 

## 2017-05-02 NOTE — ED Notes (Signed)
[place] unsuccessful. PIV not yet established; EDP made aware.

## 2017-05-03 ENCOUNTER — Emergency Department (HOSPITAL_COMMUNITY): Payer: Medicare Other

## 2017-05-03 DIAGNOSIS — J690 Pneumonitis due to inhalation of food and vomit: Secondary | ICD-10-CM | POA: Diagnosis present

## 2017-05-03 DIAGNOSIS — A419 Sepsis, unspecified organism: Secondary | ICD-10-CM | POA: Diagnosis present

## 2017-05-03 DIAGNOSIS — I5032 Chronic diastolic (congestive) heart failure: Secondary | ICD-10-CM | POA: Diagnosis not present

## 2017-05-03 DIAGNOSIS — F319 Bipolar disorder, unspecified: Secondary | ICD-10-CM | POA: Diagnosis present

## 2017-05-03 DIAGNOSIS — E119 Type 2 diabetes mellitus without complications: Secondary | ICD-10-CM | POA: Diagnosis present

## 2017-05-03 DIAGNOSIS — R471 Dysarthria and anarthria: Secondary | ICD-10-CM | POA: Diagnosis present

## 2017-05-03 DIAGNOSIS — G9340 Encephalopathy, unspecified: Secondary | ICD-10-CM | POA: Diagnosis not present

## 2017-05-03 DIAGNOSIS — J9601 Acute respiratory failure with hypoxia: Secondary | ICD-10-CM

## 2017-05-03 DIAGNOSIS — E038 Other specified hypothyroidism: Secondary | ICD-10-CM

## 2017-05-03 DIAGNOSIS — Z8542 Personal history of malignant neoplasm of other parts of uterus: Secondary | ICD-10-CM | POA: Diagnosis not present

## 2017-05-03 DIAGNOSIS — R260 Ataxic gait: Secondary | ICD-10-CM

## 2017-05-03 DIAGNOSIS — J189 Pneumonia, unspecified organism: Secondary | ICD-10-CM | POA: Diagnosis not present

## 2017-05-03 DIAGNOSIS — Z66 Do not resuscitate: Secondary | ICD-10-CM | POA: Diagnosis present

## 2017-05-03 DIAGNOSIS — I480 Paroxysmal atrial fibrillation: Secondary | ICD-10-CM | POA: Diagnosis present

## 2017-05-03 DIAGNOSIS — Z79890 Hormone replacement therapy (postmenopausal): Secondary | ICD-10-CM | POA: Diagnosis not present

## 2017-05-03 DIAGNOSIS — F17210 Nicotine dependence, cigarettes, uncomplicated: Secondary | ICD-10-CM | POA: Diagnosis present

## 2017-05-03 DIAGNOSIS — I6523 Occlusion and stenosis of bilateral carotid arteries: Secondary | ICD-10-CM | POA: Diagnosis not present

## 2017-05-03 DIAGNOSIS — G4733 Obstructive sleep apnea (adult) (pediatric): Secondary | ICD-10-CM | POA: Diagnosis present

## 2017-05-03 DIAGNOSIS — R0902 Hypoxemia: Secondary | ICD-10-CM | POA: Diagnosis not present

## 2017-05-03 DIAGNOSIS — Z90710 Acquired absence of both cervix and uterus: Secondary | ICD-10-CM | POA: Diagnosis not present

## 2017-05-03 DIAGNOSIS — R4781 Slurred speech: Secondary | ICD-10-CM | POA: Diagnosis not present

## 2017-05-03 DIAGNOSIS — E039 Hypothyroidism, unspecified: Secondary | ICD-10-CM | POA: Diagnosis present

## 2017-05-03 DIAGNOSIS — E785 Hyperlipidemia, unspecified: Secondary | ICD-10-CM | POA: Diagnosis present

## 2017-05-03 DIAGNOSIS — G459 Transient cerebral ischemic attack, unspecified: Secondary | ICD-10-CM | POA: Diagnosis present

## 2017-05-03 DIAGNOSIS — R270 Ataxia, unspecified: Secondary | ICD-10-CM | POA: Diagnosis present

## 2017-05-03 DIAGNOSIS — N390 Urinary tract infection, site not specified: Secondary | ICD-10-CM | POA: Diagnosis present

## 2017-05-03 DIAGNOSIS — Z7982 Long term (current) use of aspirin: Secondary | ICD-10-CM | POA: Diagnosis not present

## 2017-05-03 DIAGNOSIS — R748 Abnormal levels of other serum enzymes: Secondary | ICD-10-CM | POA: Diagnosis present

## 2017-05-03 DIAGNOSIS — Z7902 Long term (current) use of antithrombotics/antiplatelets: Secondary | ICD-10-CM | POA: Diagnosis not present

## 2017-05-03 DIAGNOSIS — I110 Hypertensive heart disease with heart failure: Secondary | ICD-10-CM | POA: Diagnosis present

## 2017-05-03 LAB — TROPONIN I
TROPONIN I: 0.09 ng/mL — AB (ref <0.03)
Troponin I: 0.05 ng/mL (ref <0.03)

## 2017-05-03 LAB — I-STAT CG4 LACTIC ACID, ED: Lactic Acid, Venous: 0.78 mmol/L (ref 0.5–1.9)

## 2017-05-03 LAB — STREP PNEUMONIAE URINARY ANTIGEN: STREP PNEUMO URINARY ANTIGEN: NEGATIVE

## 2017-05-03 MED ORDER — DOCUSATE SODIUM 100 MG PO CAPS: 100.0000 mg | ORAL_CAPSULE | Freq: Every day | ORAL | Status: DC | PRN

## 2017-05-03 MED ORDER — ASPIRIN 325 MG PO TABS
325.0000 mg | ORAL_TABLET | Freq: Every day | ORAL | Status: DC
  Administered 2017-05-03 – 2017-05-05 (×3): 325 mg via ORAL
  Filled 2017-05-03 (×3): qty 1

## 2017-05-03 MED ORDER — SODIUM CHLORIDE 0.9 % IV SOLN
2.0000 g | Freq: Once | INTRAVENOUS | Status: AC
  Administered 2017-05-03: 2 g via INTRAVENOUS
  Filled 2017-05-03: qty 2

## 2017-05-03 MED ORDER — CLOPIDOGREL BISULFATE 75 MG PO TABS
75.0000 mg | ORAL_TABLET | Freq: Every day | ORAL | Status: DC
Start: 2017-05-03 — End: 2017-05-05
  Administered 2017-05-03 – 2017-05-05 (×3): 75 mg via ORAL
  Filled 2017-05-03 (×3): qty 1

## 2017-05-03 MED ORDER — LEVOTHYROXINE SODIUM 75 MCG PO TABS
150.0000 ug | ORAL_TABLET | Freq: Every day | ORAL | Status: DC
  Administered 2017-05-04 – 2017-05-05 (×2): 150 ug via ORAL
  Filled 2017-05-03 (×4): qty 2

## 2017-05-03 MED ORDER — CLONAZEPAM 0.5 MG PO TABS
0.5000 mg | ORAL_TABLET | Freq: Two times a day (BID) | ORAL | Status: DC
  Administered 2017-05-03 – 2017-05-05 (×5): 0.5 mg via ORAL
  Filled 2017-05-03 (×5): qty 1

## 2017-05-03 MED ORDER — STROKE: EARLY STAGES OF RECOVERY BOOK
Freq: Once | Status: DC
  Filled 2017-05-03: qty 1

## 2017-05-03 MED ORDER — VANCOMYCIN HCL IN DEXTROSE 1-5 GM/200ML-% IV SOLN
1000.0000 mg | Freq: Once | INTRAVENOUS | Status: AC
  Administered 2017-05-03: 1000 mg via INTRAVENOUS
  Filled 2017-05-03: qty 200

## 2017-05-03 MED ORDER — VANCOMYCIN HCL IN DEXTROSE 1-5 GM/200ML-% IV SOLN
1000.0000 mg | Freq: Two times a day (BID) | INTRAVENOUS | Status: DC
  Administered 2017-05-03 (×2): 1000 mg via INTRAVENOUS
  Filled 2017-05-03 (×3): qty 200

## 2017-05-03 MED ORDER — ACETAMINOPHEN 160 MG/5ML PO SOLN: 650.0000 mg | ORAL | Status: DC | PRN

## 2017-05-03 MED ORDER — OLANZAPINE 10 MG PO TABS
20.0000 mg | ORAL_TABLET | Freq: Every day | ORAL | Status: DC
  Administered 2017-05-03 – 2017-05-04 (×2): 20 mg via ORAL
  Filled 2017-05-03 (×2): qty 2

## 2017-05-03 MED ORDER — ENOXAPARIN SODIUM 40 MG/0.4ML ~~LOC~~ SOLN
40.0000 mg | Freq: Every day | SUBCUTANEOUS | Status: DC
  Administered 2017-05-04 – 2017-05-05 (×2): 40 mg via SUBCUTANEOUS
  Filled 2017-05-03 (×3): qty 0.4

## 2017-05-03 MED ORDER — ACETAMINOPHEN 650 MG RE SUPP: 650.0000 mg | RECTAL | Status: DC | PRN

## 2017-05-03 MED ORDER — SODIUM CHLORIDE 0.9 % IV SOLN
1.0000 g | Freq: Three times a day (TID) | INTRAVENOUS | Status: DC
  Administered 2017-05-03 – 2017-05-05 (×6): 1 g via INTRAVENOUS
  Filled 2017-05-03 (×8): qty 1

## 2017-05-03 MED ORDER — ATORVASTATIN CALCIUM 40 MG PO TABS
40.0000 mg | ORAL_TABLET | Freq: Every evening | ORAL | Status: DC
  Administered 2017-05-03 – 2017-05-04 (×2): 40 mg via ORAL
  Filled 2017-05-03 (×3): qty 1

## 2017-05-03 MED ORDER — SENNOSIDES-DOCUSATE SODIUM 8.6-50 MG PO TABS: 1.0000 | ORAL_TABLET | Freq: Every evening | ORAL | Status: DC | PRN

## 2017-05-03 MED ORDER — ACETAMINOPHEN 325 MG PO TABS: 650.0000 mg | ORAL_TABLET | ORAL | Status: DC | PRN

## 2017-05-03 NOTE — ED Notes (Signed)
Dr. Tegeler at bedside.  

## 2017-05-03 NOTE — ED Notes (Signed)
Breakfast ordered 

## 2017-05-03 NOTE — H&P (Signed)
History and Physical    Kelsey Hanna PTW:[id number] DOB: 03-Dec-1950 DOA: 05/02/2017  Referring MD/NP/PA: Dr. Harrell Gave Tegeler PCP: Lance Sell, NP  Patient coming from:  Via EMS  Chief Complaint: Slurred speech and difficulty walking  I have personally briefly reviewed patient's old medical records in [employer]   HPI: Kelsey Hanna is a 67 y.o. female with medical history significant of HTN, hypothyroidism, OSA, diastolic CHF, DM type II, PAF, bilateral ICA aneurysm; who presents with complaints of slurred speech and difficulty walking.  Symptoms apparently started 3 days ago with difficulty walking.  Acutely yesterday morning she reported being unable to ambulate at all and had had to crawl in order to get help.  She also reports having slurred speech at this time.  She wa associated symptoms include complaints of shortness of breath.  Denies having any significant cough, fever, chills, sick contacts, chest pain, leg swelling, abdominal pain, dysuria, nausea, vomiting, or diarrhea.  She reports having an episode where she got choked on a piece of steak approximately 3 days ago, that she believes she was able to cough up.    ED Course: Upon admission into the emergency department patient was seen to be afebrile, pulse 101-104, respiration 15-20, blood pressure 99/73 to 110/80, O2 saturation 93-99% on 5 L nasal cannula oxygen.  O2 saturations had reportedly been in the 80s on room air. Urinalysis appeared to be contaminated.  CT angiogram of the chest showed no signs of a pulmonary embolus, but left-sided lower lobe pneumonia.  MRI/MRA of the head and neck showed no acute.  Patient was started on empiric antibiotics of vancomycin and cefepime given recent hospitalization.  TRH called to admit.   Review of Systems  Constitutional: Negative for chills and fever.  HENT: Negative for ear discharge and nosebleeds.   Eyes: Negative for photophobia and pain.  Respiratory:  Positive for shortness of breath. Negative for cough.   Cardiovascular: Negative for chest pain and leg swelling.  Gastrointestinal: Negative for abdominal pain, nausea and vomiting.  Genitourinary: Negative for flank pain and frequency.  Musculoskeletal: Negative for back pain and myalgias.  Skin: Positive for rash. Negative for itching.  Neurological: Positive for speech change, focal weakness and weakness.  Psychiatric/Behavioral: Negative for substance abuse.    Past Medical History:  Diagnosis Date  . Anxiety   . Bipolar 1 disorder ([place])   . Cancer [facility])    cancer of uterus...no chemo or radiation  . Chicken pox   . Depression   . Diabetes mellitus    dx within last yr....just takes pills  . Dyslipidemia   . Hypothyroid   . Retinal detachment   . Sleep apnea     Past Surgical History:  Procedure Laterality Date  . ABDOMINAL HYSTERECTOMY    . IR 3D INDEPENDENT WKST  03/13/2017  . IR ANGIO INTRA EXTRACRAN SEL COM CAROTID INNOMINATE UNI L MOD SED  03/13/2017  . IR ANGIO INTRA EXTRACRAN SEL INTERNAL CAROTID UNI R MOD SED  03/13/2017  . IR ANGIO VERTEBRAL SEL SUBCLAVIAN INNOMINATE UNI R MOD SED  03/13/2017  . IR ANGIOGRAM FOLLOW UP STUDY  03/13/2017  . IR CT HEAD LTD  03/13/2017  . IR RADIOLOGIST EVAL & MGMT  02/19/2017  . IR TRANSCATH/EMBOLIZ  03/13/2017  . LUMBAR LAMINECTOMY/DECOMPRESSION MICRODISCECTOMY  07/17/2011   Procedure: LUMBAR LAMINECTOMY/DECOMPRESSION MICRODISCECTOMY;  Surgeon: Sinclair Ship, MD;  Location: [place];  Service: Orthopedics;  Laterality: Left;  Left sided lumbar 4-5 microdisectomy  .  RADIOLOGY WITH ANESTHESIA N/A 03/13/2017   Procedure: EMBOLIZATION;  Surgeon: Luanne Bras, MD;  Location: [place];  Service: Radiology;  Laterality: N/A;  . TEE WITHOUT CARDIOVERSION N/A 03/17/2017   Procedure: TRANSESOPHAGEAL ECHOCARDIOGRAM (TEE);  Surgeon: Dorothy Spark, MD;  Location: [facility] ENDOSCOPY;  Service: Cardiovascular;  Laterality: N/A;  . TONSILLECTOMY      . TOTAL ABDOMINAL HYSTERECTOMY W/ BILATERAL SALPINGOOPHORECTOMY       reports that she has been smoking cigarettes.  She has a 8.75 pack-year smoking history. she has never used smokeless tobacco. She reports that she does not drink alcohol or use drugs.  No Known Allergies  Family History  Problem Relation Age of Onset  . Healthy Mother   . Healthy Father     Prior to Admission medications   Medication Sig Start Date End Date Taking? Authorizing Provider  aspirin 325 MG tablet Take 1 tablet (325 mg total) by mouth daily. 04/22/17   [name], FNP  atorvastatin (LIPITOR) 40 MG tablet Take 1 tablet (40 mg total) by mouth every evening. 03/04/17   [name], FNP  clonazePAM (KLONOPIN) 0.5 MG tablet Take 1 tablet (0.5 mg total) by mouth 2 (two) times daily. 1 in the morning and 1 midday 03/18/17   Velvet Bathe, MD  clopidogrel (PLAVIX) 75 MG tablet Take 75 mg by mouth daily.    [provider]  cyclobenzaprine (FLEXERIL) 5 MG tablet 2 (two) times daily.  04/16/17   [provider]  docusate sodium (DOCQLACE) 100 MG capsule TAKE 1 TO 2 CAPSULES BY MOUTH TWICE A DAY AS NEEDED FOR MILD CONSTIPATION 04/22/17   [name], FNP  gabapentin (NEURONTIN) 100 MG capsule Take 100 mg by mouth 3 (three) times daily.    [provider]  levothyroxine (SYNTHROID, LEVOTHROID) 150 MCG tablet TAKE 1 TABLET BY MOUTH DAILY BEFORE BREAKFAST 03/06/17   [name], FNP  linaclotide [facility]) 145 MCG CAPS capsule Take 1 capsule (145 mcg total) by mouth daily before breakfast. 03/04/17   [name], FNP  loratadine (CLARITIN) 10 MG tablet Take 1 tablet (10 mg total) by mouth daily. 04/22/17   [name], FNP  OLANZapine (ZYPREXA) 20 MG tablet Take 20 mg by mouth at bedtime.  02/11/17   [provider]  Oxycodone HCl 10 MG TABS Take 1 tablet (10 mg total) by mouth every 6 (six) hours as needed (pain). Patient taking  differently: Take 10 mg by mouth 5 (five) times daily as needed (pain).  03/18/17   Velvet Bathe, MD  tiZANidine (ZANAFLEX) 4 MG tablet Take 1 tablet (4 mg total) by mouth every 8 (eight) hours as needed for muscle spasms. Patient not taking: Reported on 04/22/2017 03/18/17   Velvet Bathe, MD  zolpidem (AMBIEN) 5 MG tablet Take 1 tablet (5 mg total) by mouth at bedtime as needed for sleep. 03/06/17   [name], FNP    Physical Exam:  Constitutional: Obese female who appears disheveled Vitals:   05/02/17 1923 05/02/17 1930 05/02/17 2030 05/02/17 2200  BP: 104/73 99/73  110/80  Pulse: (!) 104 (!) 103  (!) 101  Resp: 15 16  20   Temp:   99 F (37.2 C)   TempSrc:   Rectal   SpO2: 96% 93%  99%   Eyes: Opacification of left eye with left eyelid lag.  Right pupil reactive to light. ENMT: Mucous membranes are dry. Posterior pharynx clear of any exudate or lesions. Neck: normal, supple,  no masses, no thyromegaly Respiratory: Decreased overall aeration noted in the left lower lung base.  No significant wheezes or crackles appreciated at this time. Cardiovascular: Regular rate and rhythm, no murmurs / rubs / gallops.  Trace lower extremity extremity edema. 2+ pedal pulses. No carotid bruits.  Abdomen: no tenderness, no masses palpated. No hepatosplenomegaly. Bowel sounds positive.  Musculoskeletal: no clubbing / cyanosis. No joint deformity upper and lower extremities. Good ROM, no contractures. Normal muscle tone.  Skin: Erythema noted of the right knee Neurologic: CN 2-12 grossly intact. Sensation intact, DTR normal. Strength 5/5 in all 4.  Psychiatric: Normal judgment and insight. Alert and oriented x 3. Normal mood.     Labs on Admission: I have personally reviewed following labs and imaging studies  CBC: Recent Labs  Lab 05/02/17 2008  WBC 18.7*  HGB 12.8  HCT 41.0  MCV 92.1  PLT 379   Basic Metabolic Panel: Recent Labs  Lab 05/02/17 2008  NA 136  K 4.3  CL 103    CO2 21*  GLUCOSE 106*  BUN 10  CREATININE 0.94  CALCIUM 8.8*   GFR: Estimated Creatinine Clearance: 61.9 mL/min (by C-G formula based on SCr of 0.94 mg/dL). Liver Function Tests: No results for input(s): AST, ALT, ALKPHOS, BILITOT, PROT, ALBUMIN in the last 168 hours. No results for input(s): LIPASE, AMYLASE in the last 168 hours. Recent Labs  Lab 05/02/17 2008  AMMONIA 36*   Coagulation Profile: Recent Labs  Lab 05/02/17 2008  INR 1.10   Cardiac Enzymes: No results for input(s): CKTOTAL, CKMB, CKMBINDEX, TROPONINI in the last 168 hours. BNP (last 3 results) No results for input(s): PROBNP in the last 8760 hours. HbA1C: No results for input(s): HGBA1C in the last 72 hours. CBG: Recent Labs  Lab 05/02/17 1942  GLUCAP 117*   Lipid Profile: No results for input(s): CHOL, HDL, LDLCALC, TRIG, CHOLHDL, LDLDIRECT in the last 72 hours. Thyroid Function Tests: Recent Labs    05/02/17 2008  TSH 0.030*   Anemia Panel: No results for input(s): VITAMINB12, FOLATE, FERRITIN, TIBC, IRON, RETICCTPCT in the last 72 hours. Urine analysis:    Component Value Date/Time   COLORURINE YELLOW 05/02/2017 2252   APPEARANCEUR HAZY (A) 05/02/2017 2252   LABSPEC 1.005 05/02/2017 2252   PHURINE 6.0 05/02/2017 2252   GLUCOSEU NEGATIVE 05/02/2017 2252   HGBUR SMALL (A) 05/02/2017 2252   BILIRUBINUR NEGATIVE 05/02/2017 2252   KETONESUR NEGATIVE 05/02/2017 2252   PROTEINUR NEGATIVE 05/02/2017 2252   UROBILINOGEN 0.2 10/20/2014 0814   NITRITE NEGATIVE 05/02/2017 2252   LEUKOCYTESUR NEGATIVE 05/02/2017 2252   Sepsis Labs: No results found for this or any previous visit (from the past 240 hour(s)).   Radiological Exams on Admission: Dg Chest 2 View  Result Date: 05/02/2017 CLINICAL DATA:  Hypoxia and tachycardia EXAM: CHEST - 2 VIEW COMPARISON:  March 12, 2017 FINDINGS: There is atelectatic change in the posterior left base. Lungs elsewhere clear. Heart size and pulmonary vascularity  are normal. No adenopathy. There is aortic atherosclerosis. No bone lesions. IMPRESSION: Posterior left base atelectasis. A small focus of pneumonia in this area cannot be entirely excluded by radiography. Lungs elsewhere clear. No adenopathy. Heart size normal. There is aortic atherosclerosis. Aortic Atherosclerosis (ICD10-I70.0). Electronically Signed   By: Lowella Grip III M.D.   On: 05/02/2017 21:57   Ct Angio Chest Pe W And/or Wo Contrast  Result Date: 05/03/2017 CLINICAL DATA:  Hypoxia.  Leukocytosis. EXAM: CT ANGIOGRAPHY CHEST WITH CONTRAST TECHNIQUE: Multidetector CT  imaging of the chest was performed using the standard protocol during bolus administration of intravenous contrast. Multiplanar CT image reconstructions and MIPs were obtained to evaluate the vascular anatomy. CONTRAST:  140mL ISOVUE-370 IOPAMIDOL (ISOVUE-370) INJECTION 76% COMPARISON:  Chest radiograph from one day prior. 10/04/2014 chest CT. FINDINGS: Cardiovascular: The study is moderate quality for the evaluation of pulmonary embolism, with evaluation of the segmental and subsegmental arteries limited by motion artifact. There are no filling defects in the central, lobar, segmental or subsegmental pulmonary artery branches to suggest acute pulmonary embolism. Atherosclerotic nonaneurysmal thoracic aorta. Normal caliber pulmonary arteries. Normal heart size. No significant pericardial fluid/thickening. Left main and three-vessel coronary atherosclerosis. Mediastinum/Nodes: No discrete thyroid nodules. Unremarkable esophagus. No pathologically enlarged axillary, mediastinal or hilar lymph nodes. Lungs/Pleura: No pneumothorax. No pleural effusion. Moderate centrilobular emphysema with mild diffuse bronchial wall thickening. Patchy left lower lobe consolidation and ground-glass opacity and tree-in-bud opacities. Subsegmental scarring versus atelectasis in the dependent right lower lobe. No discrete lung masses or significant pulmonary  nodules. Upper abdomen: No acute abnormality. Musculoskeletal: No aggressive appearing focal osseous lesions. Healed deformities throughout the posterior mid to lower left ribs. Mild thoracic spondylosis. Review of the MIP images confirms the above findings. IMPRESSION: 1. Motion degraded scan.  No evidence of pulmonary embolism. 2. Patchy left lower lobe consolidation and tree-in-bud and ground-glass opacities, compatible with pneumonia. Follow-up post treatment chest radiographs advised to document resolution. 3. Left main and 3 vessel coronary atherosclerosis. Aortic Atherosclerosis (ICD10-I70.0) and Emphysema (ICD10-J43.9). Electronically Signed   By: Ilona Sorrel M.D.   On: 05/03/2017 01:19   Mr Angiogram Head Wo Contrast  Result Date: 05/03/2017 CLINICAL DATA:  Slurred speech and difficulty walking EXAM: MRI HEAD WITHOUT CONTRAST MRA HEAD WITHOUT CONTRAST TECHNIQUE: Multiplanar, multiecho pulse sequences of the brain and surrounding structures were obtained without intravenous contrast. Angiographic images of the head were obtained using MRA technique without contrast. COMPARISON:  Head CT 03/12/2017 FINDINGS: MRI HEAD FINDINGS Brain: The midline structures are normal. No focal diffusion restriction to indicate acute infarct. No intraparenchymal hemorrhage. Multifocal periventricular white matter hyperintensity, most often a result of chronic microvascular ischemia. No mass lesion. No chronic microhemorrhage or cerebral amyloid angiopathy. Mildly age advanced atrophy without lobar predilection. No dural abnormality or extra-axial collection. Skull and upper cervical spine: The visualized skull base, calvarium, upper cervical spine and extracranial soft tissues are normal. Sinuses/Orbits: No fluid levels or advanced mucosal thickening. No mastoid effusion. Normal orbits. MRA HEAD FINDINGS Intracranial internal carotid arteries: There is susceptibility artifact at the supraclinoid right ICA, at the site of  known endovascular stent. Small amount of flow related enhancement at the medial aspect of the stent. 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 Anterior cerebral arteries: Normal. Congenital variant diminutive right A1 segment. Middle cerebral arteries: Normal. Posterior communicating arteries: Present on the right. Posterior cerebral arteries: Fetal origin of the right PCA. Normal left PCA. Basilar artery: Normal. Vertebral arteries: Left dominant. Normal. Superior cerebellar arteries: Normal. Anterior inferior cerebellar arteries: Normal. Posterior inferior cerebellar arteries: Normal. IMPRESSION: 1. Chronic ischemic microangiopathy and mildly age advanced atrophy without acute intracranial abnormality. 2. Minimal residual flow related enhancement at the medial aspect of the treated right ICA ophthalmic segment aneurysm, possibly indicating a small amount of residual patency of the aneurysm sac. 3. 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 Electronically Signed   By: Ulyses Jarred M.D.   On: 05/03/2017 00:04   Mr Angiogram Neck W Or Wo Contrast  Result Date: 05/03/2017 CLINICAL DATA:  Difficulty walking.  Slurred speech. EXAM: MRA NECK WITHOUT AND WITH CONTRAST TECHNIQUE: Multiplanar and multiecho pulse sequences of the neck were obtained without and with intravenous contrast. Angiographic images of the neck were obtained using MRA technique without and with intravenous contrast. CONTRAST:  16mL MULTIHANCE GADOBENATE DIMEGLUMINE 529 MG/ML IV SOLN COMPARISON:  None. FINDINGS: There is a normal 3 vessel branching pattern of the aortic arch. The visualized proximal subclavian arteries are normal. The right common carotid artery origin is widely patent. There is no stenosis of the internal carotid artery. Normal appearance of the right external carotid artery. The origin of the left common carotid artery is widely patent. Normal carotid bifurcation. Normal left internal  carotid artery without stenosis. Normal appearance of the left external carotid artery. The vertebral system is codominant. There is moderate-to-severe stenosis of both vertebral artery origins. The vertebral arteries are otherwise normal to the vertebrobasilar confluence. Limited images of the neck soft tissues are normal. IMPRESSION: 1. No internal carotid artery stenosis or acute abnormality. 2. 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 Otherwise normal vertebral arteries. Electronically Signed   By: Ulyses Jarred M.D.   On: 05/03/2017 00:33   Mr Brain Wo Contrast  Result Date: 05/03/2017 CLINICAL DATA:  Slurred speech and difficulty walking EXAM: MRI HEAD WITHOUT CONTRAST MRA HEAD WITHOUT CONTRAST TECHNIQUE: Multiplanar, multiecho pulse sequences of the brain and surrounding structures were obtained without intravenous contrast. Angiographic images of the head were obtained using MRA technique without contrast. COMPARISON:  Head CT 03/12/2017 FINDINGS: MRI HEAD FINDINGS Brain: The midline structures are normal. No focal diffusion restriction to indicate acute infarct. No intraparenchymal hemorrhage. Multifocal periventricular white matter hyperintensity, most often a result of chronic microvascular ischemia. No mass lesion. No chronic microhemorrhage or cerebral amyloid angiopathy. Mildly age advanced atrophy without lobar predilection. No dural abnormality or extra-axial collection. Skull and upper cervical spine: The visualized skull base, calvarium, upper cervical spine and extracranial soft tissues are normal. Sinuses/Orbits: No fluid levels or advanced mucosal thickening. No mastoid effusion. Normal orbits. MRA HEAD FINDINGS Intracranial internal carotid arteries: There is susceptibility artifact at the supraclinoid right ICA, at the site of known endovascular stent. Small amount of flow related enhancement at the medial aspect of the stent. The left ophthalmic segment aneurysm  measures 6 x 4 mm, unchanged. Anterior cerebral arteries: Normal. Congenital variant diminutive right A1 segment. Middle cerebral arteries: Normal. Posterior communicating arteries: Present on the right. Posterior cerebral arteries: Fetal origin of the right PCA. Normal left PCA. Basilar artery: Normal. Vertebral arteries: Left dominant. Normal. Superior cerebellar arteries: Normal. Anterior inferior cerebellar arteries: Normal. Posterior inferior cerebellar arteries: Normal. IMPRESSION: 1. Chronic ischemic microangiopathy and mildly age advanced atrophy without acute intracranial abnormality. 2. Minimal residual flow related enhancement at the medial aspect of the treated right ICA ophthalmic segment aneurysm, possibly indicating a small amount of residual patency of the aneurysm sac. 3. 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 Electronically Signed   By: Ulyses Jarred M.D.   On: 05/03/2017 00:04    EKG: Independently reviewed.  Sinus tachycardia 103 bpm no clear atrial premature complexes noted.  Assessment/Plan Acute respiratory failure with hypoxia: Acute.  Patient reportedly with O2 saturations in 80s on arrival of EMS requiring placement to the proximal 5 L of nasal cannula oxygen to improve O2 saturations. - Admit to telemetry bed - Continuous pulse oximetry with nasal cannula oxygen as needed,   SIRS/sepsis 2/2 suspected aspiration  vs. healthcare associated pneumonia: Patient presents with tachycardia with WBC 18.7. An initial lactic acid level had been delayed.  CT scan showed signs of the left lobe pneumonia.  However patient noted to recently have had choking event.  Question if this is because of her acute drop in O2 saturations as does not appear fluid overloaded. - Follow-up blood and sputum cultures - Continue vancomycin and cefepime, de-escalate when medically appropriate - Recheck CBC in a.m. - Patient noted to have recent aspiration event may warrant pulmonary  consult for bronchoscopy  Diastolic CHF: Patient presents with a BNP 402, but does not appear grossly fluid overloaded on physical exam. Patient's last echocardiogram was on 1/29 showing EF of 65-70% with grade 1 diastolic dysfunction - Strict I&O's - Daily weights  Ataxia and dysarthria, history of CVA: Now resolved.  Patient notes having progressively worsening ability to ambulate over 3-day.  With new symptoms of inability to ambulate.  MRI imaging showed no new abnormalities to cause patient's symptoms.  Neurology consulted. - Neurochecks - Continue aspirin, Plavix - PT/OT/speech to eval and treat  Elevated troponin: Acute.  Patient denies any complaints of chest pain initial troponin noted to be 0.19.  This may coexist with acute respiratory distress. - Trending troponin  H/O bilateral carotid artery aneurysms: Patient status post repair of right internal carotid artery aneurysm.  Left carotid artery aneurysm needs repair.  Hypothyroidism: TSH noted to be 0.3 - Continue levothyroxine  Bipolar disorder - Continue olanzapine  Hyperlipidemia - Continue atorvastatin  DVT prophylaxis: Lovenox Code Status: Full Family Communication: No family present at bedside Disposition Plan: TBD   Consults called: Neurology Admission status: Inpatient  Norval Morton MD [facility] Pager [phone]   If 7PM-7AM, please contact night-coverage [email or web address] Password [email or web address]  05/03/2017, 1:29 AM

## 2017-05-03 NOTE — Progress Notes (Signed)
Pharmacy Antibiotic Note  Kelsey Hanna is a 67 y.o. female admitted on 05/02/2017 with pneumonia.  Pharmacy has been consulted for Vancomycin dosing. CT with PNA. WBC elevated. Renal function ok.   Plan: Vancomycin 1000 mg IV q12h Cefepime per MD Trend WBC, temp, renal function  F/U infectious work-up Drug levels as indicated  Temp (24hrs), Avg:98.5 F (36.9 C), Min:98 F (36.7 C), Max:99 F (37.2 C)  Recent Labs  Lab 05/02/17 2008 05/03/17 0227  WBC 18.7*  --   CREATININE 0.94  --   LATICACIDVEN  --  0.78    Estimated Creatinine Clearance: 61.9 mL/min (by C-G formula based on SCr of 0.94 mg/dL).    No Known Allergies  Kelsey Hanna 05/03/2017 4:35 AM

## 2017-05-03 NOTE — ED Notes (Signed)
Pt on bedpan at this time.

## 2017-05-03 NOTE — ED Notes (Signed)
Pt ambulated to the bathroom. 1 Assist. Sat: 97% when returned to bed.

## 2017-05-03 NOTE — ED Provider Notes (Signed)
[place] EMERGENCY DEPARTMENT Provider Note   CSN: [id number] Arrival date & time: 05/02/17  1916     History   Chief Complaint Chief Complaint  Patient presents with  . Weakness    HPI Kelsey Hanna is a 67 y.o. female.  The history is provided by the patient and medical records. No language interpreter was used.  Neurologic Problem  This is a new problem. The current episode started 12 to 24 hours ago. The problem occurs constantly. The problem has not changed since onset.Associated symptoms include shortness of breath. Pertinent negatives include no chest pain, no abdominal pain and no headaches. Nothing aggravates the symptoms. Nothing relieves the symptoms. She has tried nothing for the symptoms.  Shortness of Breath  This is a new problem. The problem occurs continuously.The current episode started 12 to 24 hours ago. The problem has not changed since onset.Pertinent negatives include no fever, no headaches, no rhinorrhea, no neck pain, no cough, no sputum production, no wheezing, no chest pain, no syncope, no vomiting, no abdominal pain and no leg swelling. She has tried nothing for the symptoms. Associated medical issues do not include DVT.    Past Medical History:  Diagnosis Date  . Anxiety   . Bipolar 1 disorder ([place])   . Cancer [facility])    cancer of uterus...no chemo or radiation  . Chicken pox   . Depression   . Diabetes mellitus    dx within last yr....just takes pills  . Dyslipidemia   . Hypothyroid   . Retinal detachment   . Sleep apnea     Patient Active Problem List   Diagnosis Date Noted  . MRSA bacteremia   . Brain aneurysm 03/13/2017  . Internal carotid aneurysm   . Slurred speech 03/12/2017  . Hypotension 03/12/2017  . Dysarthria   . Left sided numbness   . Acute left-sided muscle weakness 01/24/2017  . 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 Intracranial aneurysm 01/24/2017  . Encounter for medication review 02/07/2016    . Hypoxia, sleep related 09/07/2015  . Constipation 07/30/2015  . Dizziness 07/30/2015  . Chronic diastolic CHF (congestive heart failure) ([place]) 06/26/2015  . Bilateral lower extremity edema 06/26/2015  . Mitral regurgitation 05/17/2015  . Accidental drug overdose 05/17/2015  . Respiratory failure with hypoxia and hypercapnia ([place]) 05/09/2015  . OSA (obstructive sleep apnea) 05/09/2015  . Overdose 05/09/2015  . Chronic back pain 03/20/2015  . Right shoulder pain 11/14/2014  . Insomnia 11/14/2014  . Psychoses ([place])   . Bacteremia   . Pedal edema 09/12/2014  . Bipolar disorder, current episode manic without psychotic features, severe ([place])   . Bipolar disorder ([place]) 09/05/2014  . Bipolar affective disorder, current episode manic with psychotic symptoms ([place]) 09/05/2014  . Bipolar affective disorder, manic ([place]) 09/03/2014  . Encounter for preadmission testing   . Head revolving around 07/27/2014  . BP (high blood pressure) 05/23/2014  . AF (paroxysmal atrial fibrillation) ([place]) 05/23/2014  . Type 2 diabetes mellitus ([place]) 04/27/2014  . Essential hypertension 04/27/2014  . Hypothyroidism 04/27/2014  . Tobacco abuse 04/27/2014  . Bipolar I disorder ([place]) 04/20/2014  . Acute encephalopathy 04/18/2014  . Acute respiratory failure ([place])   . Encounter for feeding tube placement   . Shortness of breath   . Gout 08/25/2013  . 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 HLD (hyperlipidemia) 08/30/2009    Past Surgical History:  Procedure Laterality Date  . ABDOMINAL HYSTERECTOMY    . IR 3D INDEPENDENT  [facility]  03/13/2017  . IR ANGIO INTRA EXTRACRAN SEL COM CAROTID INNOMINATE UNI L MOD SED  03/13/2017  . IR ANGIO INTRA EXTRACRAN SEL INTERNAL CAROTID UNI R MOD SED  03/13/2017  . IR ANGIO VERTEBRAL SEL SUBCLAVIAN INNOMINATE UNI R MOD SED  03/13/2017  . IR ANGIOGRAM FOLLOW UP STUDY  03/13/2017  . IR CT HEAD LTD  03/13/2017  . IR RADIOLOGIST EVAL & MGMT  02/19/2017  . IR TRANSCATH/EMBOLIZ  03/13/2017  .  LUMBAR LAMINECTOMY/DECOMPRESSION MICRODISCECTOMY  07/17/2011   Procedure: LUMBAR LAMINECTOMY/DECOMPRESSION MICRODISCECTOMY;  Surgeon: Sinclair Ship, MD;  Location: [place];  Service: Orthopedics;  Laterality: Left;  Left sided lumbar 4-5 microdisectomy  . RADIOLOGY WITH ANESTHESIA N/A 03/13/2017   Procedure: EMBOLIZATION;  Surgeon: Luanne Bras, MD;  Location: [place];  Service: Radiology;  Laterality: N/A;  . TEE WITHOUT CARDIOVERSION N/A 03/17/2017   Procedure: TRANSESOPHAGEAL ECHOCARDIOGRAM (TEE);  Surgeon: Dorothy Spark, MD;  Location[facility] ENDOSCOPY;  Service: Cardiovascular;  Laterality: N/A;  . TONSILLECTOMY    . TOTAL ABDOMINAL HYSTERECTOMY W/ BILATERAL SALPINGOOPHORECTOMY      OB History    No data available       Home Medications    Prior to Admission medications   Medication Sig Start Date End Date Taking? Authorizing Provider  aspirin 325 MG tablet Take 1 tablet (325 mg total) by mouth daily. 04/22/17   [name], FNP  atorvastatin (LIPITOR) 40 MG tablet Take 1 tablet (40 mg total) by mouth every evening. 03/04/17   [name], FNP  clonazePAM (KLONOPIN) 0.5 MG tablet Take 1 tablet (0.5 mg total) by mouth 2 (two) times daily. 1 in the morning and 1 midday 03/18/17   Velvet Bathe, MD  clopidogrel (PLAVIX) 75 MG tablet Take 75 mg by mouth daily.    [provider]  cyclobenzaprine (FLEXERIL) 5 MG tablet 2 (two) times daily.  04/16/17   [provider]  docusate sodium (DOCQLACE) 100 MG capsule TAKE 1 TO 2 CAPSULES BY MOUTH TWICE A DAY AS NEEDED FOR MILD CONSTIPATION 04/22/17   [name], FNP  gabapentin (NEURONTIN) 100 MG capsule Take 100 mg by mouth 3 (three) times daily.    [provider]  levothyroxine (SYNTHROID, LEVOTHROID) 150 MCG tablet TAKE 1 TABLET BY MOUTH DAILY BEFORE BREAKFAST 03/06/17   [name], FNP  linaclotide [facility]) 145 MCG CAPS capsule Take 1 capsule (145 mcg total) by mouth  daily before breakfast. 03/04/17   [name], FNP  loratadine (CLARITIN) 10 MG tablet Take 1 tablet (10 mg total) by mouth daily. 04/22/17   [name], FNP  OLANZapine (ZYPREXA) 20 MG tablet Take 20 mg by mouth at bedtime.  02/11/17   [provider]  Oxycodone HCl 10 MG TABS Take 1 tablet (10 mg total) by mouth every 6 (six) hours as needed (pain). Patient taking differently: Take 10 mg by mouth 5 (five) times daily as needed (pain).  03/18/17   Velvet Bathe, MD  tiZANidine (ZANAFLEX) 4 MG tablet Take 1 tablet (4 mg total) by mouth every 8 (eight) hours as needed for muscle spasms. Patient not taking: Reported on 04/22/2017 03/18/17   Velvet Bathe, MD  zolpidem (AMBIEN) 5 MG tablet Take 1 tablet (5 mg total) by mouth at bedtime as needed for sleep. 03/06/17   [name], FNP    Family History Family History  Problem Relation Age of Onset  . Healthy Mother   . Healthy Father  Social History Social History   Tobacco Use  . Smoking status: Current Every Day Smoker    Packs/day: 0.25    Years: 35.00    Pack years: 8.75    Types: Cigarettes    Last attempt to quit: 07/09/1999    Years since quitting: 17.8  . Smokeless tobacco: Never Used  Substance Use Topics  . Alcohol use: No    Alcohol/week: 0.0 oz    Comment: socially  . Drug use: No     Allergies   Patient has no known allergies.   Review of Systems Review of Systems  Constitutional: Positive for fatigue. Negative for chills, diaphoresis and fever.  HENT: Negative for congestion and rhinorrhea.   Eyes: Negative for visual disturbance.  Respiratory: Positive for choking (several days ago) and shortness of breath. Negative for cough, sputum production, chest tightness, wheezing and stridor.   Cardiovascular: Negative for chest pain, palpitations, leg swelling and syncope.  Gastrointestinal: Negative for abdominal pain and vomiting.  Genitourinary: Negative for dysuria and  flank pain.  Musculoskeletal: Negative for back pain and neck pain.  Neurological: Positive for speech difficulty and numbness. Negative for facial asymmetry, light-headedness and headaches.  Psychiatric/Behavioral: Negative for agitation and confusion.  All other systems reviewed and are negative.    Physical Exam Updated Vital Signs BP 110/80   Pulse (!) 101   Temp 99 F (37.2 C) (Rectal)   Resp 20   SpO2 99%   Physical Exam  Constitutional: She is oriented to person, place, and time. She appears well-developed and well-nourished.  Non-toxic appearance. She does not appear ill. No distress.  HENT:  Head: Normocephalic.  Nose: Nose normal.  Mouth/Throat: Oropharynx is clear and moist. No oropharyngeal exudate.  Eyes: Conjunctivae and EOM are normal. Pupils are equal, round, and reactive to light.  Neck: Normal range of motion.  Cardiovascular: Intact distal pulses. Tachycardia present.  No murmur heard. Pulmonary/Chest: Tachypnea noted. No respiratory distress. She has no decreased breath sounds. She has no wheezes. She has rhonchi. She has rales.  Abdominal: There is no tenderness.  Musculoskeletal: She exhibits no edema, tenderness or deformity.  Neurological: She is alert and oriented to person, place, and time. A sensory deficit is present. No cranial nerve deficit. She exhibits normal muscle tone. Coordination normal.  Skin: Capillary refill takes less than 2 seconds. No rash noted. She is not diaphoretic. No erythema.  Psychiatric: She has a normal mood and affect.  Nursing note and vitals reviewed.    ED Treatments / Results  Labs (all labs ordered are listed, but only abnormal results are displayed) Labs Reviewed  BASIC METABOLIC PANEL - Abnormal; Notable for the following components:      Result Value   CO2 21 (*)    Glucose, Bld 106 (*)    Calcium 8.8 (*)    All other components within normal limits  CBC - Abnormal; Notable for the following components:    WBC 18.7 (*)    All other components within normal limits  URINALYSIS, ROUTINE W REFLEX MICROSCOPIC - Abnormal; Notable for the following components:   APPearance HAZY (*)    Hgb urine dipstick SMALL (*)    Bacteria, UA RARE (*)    Squamous Epithelial / LPF 6-30 (*)    All other components within normal limits  TSH - Abnormal; Notable for the following components:   TSH 0.030 (*)    All other components within normal limits  AMMONIA - Abnormal; Notable for the following  components:   Ammonia 36 (*)    All other components within normal limits  BRAIN NATRIURETIC PEPTIDE - Abnormal; Notable for the following components:   B Natriuretic Peptide 402.8 (*)    All other components within normal limits  D-DIMER, QUANTITATIVE (NOT AT [facility]) - Abnormal; Notable for the following components:   D-Dimer, Quant 2.58 (*)    All other components within normal limits  CBG MONITORING, ED - Abnormal; Notable for the following components:   Glucose-Capillary 117 (*)    All other components within normal limits  I-STAT TROPONIN, ED - Abnormal; Notable for the following components:   Troponin i, poc 0.19 (*)    All other components within normal limits  CULTURE, BLOOD (ROUTINE X 2)  CULTURE, BLOOD (ROUTINE X 2)  PROTIME-INR  I-STAT CG4 LACTIC ACID, ED    EKG  EKG Interpretation None      ED ECG REPORT   Date: 05/03/2017  Rate: 103  Rhythm: sinus tachycardia  QRS Axis: left  Intervals: normal  ST/T Wave abnormalities: normal  Conduction Disutrbances:atrial premature complex  Narrative Interpretation:   Old EKG Reviewed: unchanged  I have personally reviewed the EKG tracing and agree with the computerized printout as noted.   Radiology Dg Chest 2 View  Result Date: 05/02/2017 CLINICAL DATA:  Hypoxia and tachycardia EXAM: CHEST - 2 VIEW COMPARISON:  March 12, 2017 FINDINGS: There is atelectatic change in the posterior left base. Lungs elsewhere clear. Heart size and pulmonary  vascularity are normal. No adenopathy. There is aortic atherosclerosis. No bone lesions. IMPRESSION: Posterior left base atelectasis. A small focus of pneumonia in this area cannot be entirely excluded by radiography. Lungs elsewhere clear. No adenopathy. Heart size normal. There is aortic atherosclerosis. Aortic Atherosclerosis (ICD10-I70.0). Electronically Signed   By: Lowella Grip III M.D.   On: 05/02/2017 21:57   Ct Angio Chest Pe W And/or Wo Contrast  Result Date: 05/03/2017 CLINICAL DATA:  Hypoxia.  Leukocytosis. EXAM: CT ANGIOGRAPHY CHEST WITH CONTRAST TECHNIQUE: Multidetector CT imaging of the chest was performed using the standard protocol during bolus administration of intravenous contrast. Multiplanar CT image reconstructions and MIPs were obtained to evaluate the vascular anatomy. CONTRAST:  171mL ISOVUE-370 IOPAMIDOL (ISOVUE-370) INJECTION 76% COMPARISON:  Chest radiograph from one day prior. 10/04/2014 chest CT. FINDINGS: Cardiovascular: The study is moderate quality for the evaluation of pulmonary embolism, with evaluation of the segmental and subsegmental arteries limited by motion artifact. There are no filling defects in the central, lobar, segmental or subsegmental pulmonary artery branches to suggest acute pulmonary embolism. Atherosclerotic nonaneurysmal thoracic aorta. Normal caliber pulmonary arteries. Normal heart size. No significant pericardial fluid/thickening. Left main and three-vessel coronary atherosclerosis. Mediastinum/Nodes: No discrete thyroid nodules. Unremarkable esophagus. No pathologically enlarged axillary, mediastinal or hilar lymph nodes. Lungs/Pleura: No pneumothorax. No pleural effusion. Moderate centrilobular emphysema with mild diffuse bronchial wall thickening. Patchy left lower lobe consolidation and ground-glass opacity and tree-in-bud opacities. Subsegmental scarring versus atelectasis in the dependent right lower lobe. No discrete lung masses or significant  pulmonary nodules. Upper abdomen: No acute abnormality. Musculoskeletal: No aggressive appearing focal osseous lesions. Healed deformities throughout the posterior mid to lower left ribs. Mild thoracic spondylosis. Review of the MIP images confirms the above findings. IMPRESSION: 1. Motion degraded scan.  No evidence of pulmonary embolism. 2. Patchy left lower lobe consolidation and tree-in-bud and ground-glass opacities, compatible with pneumonia. Follow-up post treatment chest radiographs advised to document resolution. 3. Left main and 3 vessel coronary atherosclerosis. Aortic Atherosclerosis (ICD10-I70.0)  and Emphysema (ICD10-J43.9). Electronically Signed   By: Ilona Sorrel M.D.   On: 05/03/2017 01:19   Mr Angiogram Head Wo Contrast  Result Date: 05/03/2017 CLINICAL DATA:  Slurred speech and difficulty walking EXAM: MRI HEAD WITHOUT CONTRAST MRA HEAD WITHOUT CONTRAST TECHNIQUE: Multiplanar, multiecho pulse sequences of the brain and surrounding structures were obtained without intravenous contrast. Angiographic images of the head were obtained using MRA technique without contrast. COMPARISON:  Head CT 03/12/2017 FINDINGS: MRI HEAD FINDINGS Brain: The midline structures are normal. No focal diffusion restriction to indicate acute infarct. No intraparenchymal hemorrhage. Multifocal periventricular white matter hyperintensity, most often a result of chronic microvascular ischemia. No mass lesion. No chronic microhemorrhage or cerebral amyloid angiopathy. Mildly age advanced atrophy without lobar predilection. No dural abnormality or extra-axial collection. Skull and upper cervical spine: The visualized skull base, calvarium, upper cervical spine and extracranial soft tissues are normal. Sinuses/Orbits: No fluid levels or advanced mucosal thickening. No mastoid effusion. Normal orbits. MRA HEAD FINDINGS Intracranial internal carotid arteries: There is susceptibility artifact at the supraclinoid right ICA, at the  site of known endovascular stent. Small amount of flow related enhancement at the medial aspect of the stent. 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 Anterior cerebral arteries: Normal. Congenital variant diminutive right A1 segment. Middle cerebral arteries: Normal. Posterior communicating arteries: Present on the right. Posterior cerebral arteries: Fetal origin of the right PCA. Normal left PCA. Basilar artery: Normal. Vertebral arteries: Left dominant. Normal. Superior cerebellar arteries: Normal. Anterior inferior cerebellar arteries: Normal. Posterior inferior cerebellar arteries: Normal. IMPRESSION: 1. Chronic ischemic microangiopathy and mildly age advanced atrophy without acute intracranial abnormality. 2. Minimal residual flow related enhancement at the medial aspect of the treated right ICA ophthalmic segment aneurysm, possibly indicating a small amount of residual patency of the aneurysm sac. 3. 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 Electronically Signed   By: Ulyses Jarred M.D.   On: 05/03/2017 00:04   Mr Angiogram Neck W Or Wo Contrast  Result Date: 05/03/2017 CLINICAL DATA:  Difficulty walking.  Slurred speech. EXAM: MRA NECK WITHOUT AND WITH CONTRAST TECHNIQUE: Multiplanar and multiecho pulse sequences of the neck were obtained without and with intravenous contrast. Angiographic images of the neck were obtained using MRA technique without and with intravenous contrast. CONTRAST:  72mL MULTIHANCE GADOBENATE DIMEGLUMINE 529 MG/ML IV SOLN COMPARISON:  None. FINDINGS: There is a normal 3 vessel branching pattern of the aortic arch. The visualized proximal subclavian arteries are normal. The right common carotid artery origin is widely patent. There is no stenosis of the internal carotid artery. Normal appearance of the right external carotid artery. The origin of the left common carotid artery is widely patent. Normal carotid bifurcation. Normal left  internal carotid artery without stenosis. Normal appearance of the left external carotid artery. The vertebral system is codominant. There is moderate-to-severe stenosis of both vertebral artery origins. The vertebral arteries are otherwise normal to the vertebrobasilar confluence. Limited images of the neck soft tissues are normal. IMPRESSION: 1. No internal carotid artery stenosis or acute abnormality. 2. 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 Otherwise normal vertebral arteries. Electronically Signed   By: Ulyses Jarred M.D.   On: 05/03/2017 00:33   Mr Brain Wo Contrast  Result Date: 05/03/2017 CLINICAL DATA:  Slurred speech and difficulty walking EXAM: MRI HEAD WITHOUT CONTRAST MRA HEAD WITHOUT CONTRAST TECHNIQUE: Multiplanar, multiecho pulse sequences of the brain and surrounding structures were obtained without intravenous contrast. Angiographic images of  the head were obtained using MRA technique without contrast. COMPARISON:  Head CT 03/12/2017 FINDINGS: MRI HEAD FINDINGS Brain: The midline structures are normal. No focal diffusion restriction to indicate acute infarct. No intraparenchymal hemorrhage. Multifocal periventricular white matter hyperintensity, most often a result of chronic microvascular ischemia. No mass lesion. No chronic microhemorrhage or cerebral amyloid angiopathy. Mildly age advanced atrophy without lobar predilection. No dural abnormality or extra-axial collection. Skull and upper cervical spine: The visualized skull base, calvarium, upper cervical spine and extracranial soft tissues are normal. Sinuses/Orbits: No fluid levels or advanced mucosal thickening. No mastoid effusion. Normal orbits. MRA HEAD FINDINGS Intracranial internal carotid arteries: There is susceptibility artifact at the supraclinoid right ICA, at the site of known endovascular stent. Small amount of flow related enhancement at the medial aspect of the stent. The left ophthalmic segment  aneurysm measures 6 x 4 mm, unchanged. Anterior cerebral arteries: Normal. Congenital variant diminutive right A1 segment. Middle cerebral arteries: Normal. Posterior communicating arteries: Present on the right. Posterior cerebral arteries: Fetal origin of the right PCA. Normal left PCA. Basilar artery: Normal. Vertebral arteries: Left dominant. Normal. Superior cerebellar arteries: Normal. Anterior inferior cerebellar arteries: Normal. Posterior inferior cerebellar arteries: Normal. IMPRESSION: 1. Chronic ischemic microangiopathy and mildly age advanced atrophy without acute intracranial abnormality. 2. Minimal residual flow related enhancement at the medial aspect of the treated right ICA ophthalmic segment aneurysm, possibly indicating a small amount of residual patency of the aneurysm sac. 3. 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 Electronically Signed   By: Ulyses Jarred M.D.   On: 05/03/2017 00:04    Procedures Procedures (including critical care time)  CRITICAL CARE Performed by: Gwenyth Allegra Tegeler Total critical care time: 45 minutes Critical care time was exclusive of separately billable procedures and treating other patients. Critical care was necessary to treat or prevent imminent or life-threatening deterioration. Critical care was time spent personally by me on the following activities: development of treatment plan with patient and/or surrogate as well as nursing, discussions with consultants, evaluation of patient's response to treatment, examination of patient, obtaining history from patient or surrogate, ordering and performing treatments and interventions, ordering and review of laboratory studies, ordering and review of radiographic studies, pulse oximetry and re-evaluation of patient's condition.   Medications Ordered in ED Medications  ceFEPIme (MAXIPIME) 2 g in sodium chloride 0.9 % 100 mL IVPB (not administered)  vancomycin (VANCOCIN) IVPB 1000  mg/200 mL premix (not administered)  sodium chloride 0.9 % bolus 500 mL (500 mLs Intravenous New Bag/Given 05/02/17 2251)  iopamidol (ISOVUE-370) 76 % injection (100 mLs  Contrast Given 05/03/17 0028)  gadobenate dimeglumine (MULTIHANCE) injection 18 mL (18 mLs Intravenous Contrast Given 05/02/17 2354)     Initial Impression / Assessment and Plan / ED Course  I have reviewed the triage vital signs and the nursing notes.  Pertinent labs & imaging results that were available during my care of the patient were reviewed by me and considered in my medical decision making (see chart for details).     Kelsey Hanna is a 67 y.o. 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  Patient reports that she was doing well for the last week and had was normal  last night at 11 PM going to bed.  She says that she woke up this morning and was having slurred speech and right-sided numbness.  She denies unilateral weakness but reports generalized weakness.  She reports no facial droop but does say that she has numbness in the right face, right arm, and right leg.  This is new.  Patient reports no fevers, chills, congestion, cough, nausea, vomiting, vision change, urinary symptoms, constipation, or diarrhea.  She does report some shortness of breath.  She denies any chest pain.  Patient reports feeling very fatigued, sleepy, and tired.  Patient reports that she has been taking her medications as directed including pain medicine and benzodiazepines.  On arrival, patient is on 5 L nasal cannula for hypoxia.  Patient is maintaining her oxygen saturation on the new  oxygen requirement.  On exam, patient was also tachycardic with a rate in the low 100s.  Chest was nontender.  Back nontender.  Patient's lungs had coarse breath sounds bilaterally.  Minimal crackles in the bases.  Abdomen nontender.  Patient had decreased sensation in the right face, right arm, and right leg.  Symmetric grip strength.  Symmetric leg strength.  No facial droop seen.  Normal extraocular movement however patient is blind in left eye.  Patient is oriented but is sleepy in the room.  Patient will wake to voice.  Based on patient's report of speech trouble and right-sided deficits I am concerned about a left intracranial problem.  I am also concerned about the patient's hypoxia, tachycardia, and shortness of breath.  Patient will workup to look for pneumonia or occult infection with chest x-ray as well as lab testing to look for infection.  Rectal temperature will be collected as the patient felt warm on tactile touch.  Will speak to neurology to discuss imaging of the brain given her recent coiling on the right side and the known aneurysm on the left with her new symptoms.   Patient is Lucianne Lei negative, do not feel patient is in a window for code stroke.  Anticipate reassessment after further workup and patient will likely admission for the new oxygen requirement as well as her neurologic complaints.  Ultrasound IV was placed by me after access was unable to be obtained by nursing team.   Diagnostic testing began to return.  Troponin was elevated at 0.19.  BNP elevated at 402.  D-dimer elevated.  All 3 of these are elevated compared to prior.  TSH is low compared to prior.  INR not elevated.   Patient did however have evidence of a leukocytosis.  This is new compared to prior.  Metabolic panel reassuring.    A lactic acid will be added given the concern for possible infection as the rectal temperature was 99.  CT scan was performed showing no evidence of pulmonary embolism on the  preliminary read when radiology was called however they did show concern for pneumonia.  Next  Patient will have blood cultures and broad-spectrum antibiotic will be ordered.  As patient was admitted within the last few months, she will given broad-spectrum antibiotics.  Pneumonia is likely causing the shortness of breath, hypoxia, tachycardia, as well as the elevated troponin causing some demand ischemia/strain.  Patient will be called for admission to hospital service for further management.  MRI did not show evidence of new stroke.  Patient stroke symptoms may be reactivation of prior stroke in the setting of a new pneumonia.  1:14 AM Nursing reports that they spoke to the  patient and discovered she choked on a piece of steak 3 days ago then had to give herself the Heimlich maneuver.  In the setting of the pneumonia, will consider an aspiration pneumonia given the absence of other cough.  If patient's symptoms do not improve, would consider speaking with pulmonology for bronchoscopy consideration.   Final Clinical Impressions(s) / ED Diagnoses   Final diagnoses:  HCAP (healthcare-associated pneumonia)  Numbness  Hypoxia    Clinical Impression: 1. HCAP (healthcare-associated pneumonia)   2. Numbness   3. Hypoxia     Disposition: Admit  This note was prepared with assistance of Dragon voice recognition software. Occasional wrong-word or sound-a-like substitutions may have occurred due to the inherent limitations of voice recognition software.      Tegeler, Gwenyth Allegra, MD 05/03/17 0130

## 2017-05-03 NOTE — Progress Notes (Signed)
Patient ID: Kelsey Hanna, female   DOB: [birth year], 67 y.o.   MRN: [id number]  Patient was admitted early this morning for slurred speech and difficulty walking. She was found to be hypoxic secondary to pneumonia and was started on antibiotics. MRI was negative for acute stroke. Patient seen an bedside and are discussed with her. This morning's h&P was review. Continue antibiotics. Repeat a.m. Labs. PT eval. Spoke to on call neurologist/Dr. Cheral Marker and given negative MRI and resolution of her symptoms,we will hold off on neuro eval at this time.

## 2017-05-03 NOTE — ED Notes (Signed)
Reject x1 will have other phleb draw labs.

## 2017-05-04 DIAGNOSIS — E039 Hypothyroidism, unspecified: Secondary | ICD-10-CM

## 2017-05-04 DIAGNOSIS — J189 Pneumonia, unspecified organism: Secondary | ICD-10-CM

## 2017-05-04 DIAGNOSIS — A419 Sepsis, unspecified organism: Principal | ICD-10-CM

## 2017-05-04 DIAGNOSIS — F319 Bipolar disorder, unspecified: Secondary | ICD-10-CM

## 2017-05-04 DIAGNOSIS — I5032 Chronic diastolic (congestive) heart failure: Secondary | ICD-10-CM

## 2017-05-04 LAB — CBC WITH DIFFERENTIAL/PLATELET
BASOS ABS: 0 10*3/uL (ref 0.0–0.1)
Basophils Relative: 0 %
EOS PCT: 2 %
Eosinophils Absolute: 0.2 10*3/uL (ref 0.0–0.7)
HCT: 34.8 % — ABNORMAL LOW (ref 36.0–46.0)
Hemoglobin: 11.2 g/dL — ABNORMAL LOW (ref 12.0–15.0)
LYMPHS ABS: 1.6 10*3/uL (ref 0.7–4.0)
Lymphocytes Relative: 17 %
MCH: 29.1 pg (ref 26.0–34.0)
MCHC: 32.2 g/dL (ref 30.0–36.0)
MCV: 90.4 fL (ref 78.0–100.0)
Monocytes Absolute: 0.5 10*3/uL (ref 0.1–1.0)
Monocytes Relative: 5 %
NEUTROS PCT: 76 %
Neutro Abs: 7 10*3/uL (ref 1.7–7.7)
PLATELETS: 243 10*3/uL (ref 150–400)
RBC: 3.85 MIL/uL — AB (ref 3.87–5.11)
RDW: 13.9 % (ref 11.5–15.5)
WBC: 9.3 10*3/uL (ref 4.0–10.5)

## 2017-05-04 LAB — MAGNESIUM: Magnesium: 1.8 mg/dL (ref 1.7–2.4)

## 2017-05-04 LAB — COMPREHENSIVE METABOLIC PANEL
ALBUMIN: 3.5 g/dL (ref 3.5–5.0)
ALT: 25 U/L (ref 14–54)
AST: 59 U/L — AB (ref 15–41)
Alkaline Phosphatase: 67 U/L (ref 38–126)
Anion gap: 10 (ref 5–15)
BUN: 9 mg/dL (ref 6–20)
CHLORIDE: 108 mmol/L (ref 101–111)
CO2: 24 mmol/L (ref 22–32)
CREATININE: 0.63 mg/dL (ref 0.44–1.00)
Calcium: 9.3 mg/dL (ref 8.9–10.3)
GFR calc Af Amer: >60 mL/min (ref >60)
GFR calc non Af Amer: >60 mL/min (ref >60)
GLUCOSE: 139 mg/dL — AB (ref 65–99)
Potassium: 3.7 mmol/L (ref 3.5–5.1)
Sodium: 142 mmol/L (ref 135–145)
Total Bilirubin: 0.9 mg/dL (ref 0.3–1.2)
Total Protein: 6.4 g/dL — ABNORMAL LOW (ref 6.5–8.1)

## 2017-05-04 LAB — LEGIONELLA PNEUMOPHILA SEROGP 1 UR AG: L. pneumophila Serogp 1 Ur Ag: NEGATIVE

## 2017-05-04 NOTE — Evaluation (Signed)
Speech Language Pathology Evaluation Patient Details Name: SIMONE RODENBECK MRN: [id number] DOB: [birth year] Today's Date: 05/04/2017 Time: 8315-1761 SLP Time Calculation (min) (ACUTE ONLY): 17 min  Problem List:  Patient Active Problem List   Diagnosis Date Noted  . Sepsis ([place]) 05/03/2017  . MRSA bacteremia   . Brain aneurysm 03/13/2017  . Internal carotid aneurysm   . Slurred speech 03/12/2017  . Hypotension 03/12/2017  . Dysarthria   . Left sided numbness   . Acute left-sided muscle weakness 01/24/2017  . 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 Intracranial aneurysm 01/24/2017  . Encounter for medication review 02/07/2016  . Hypoxia, sleep related 09/07/2015  . Constipation 07/30/2015  . Dizziness 07/30/2015  . Chronic diastolic CHF (congestive heart failure) ([place]) 06/26/2015  . Bilateral lower extremity edema 06/26/2015  . Mitral regurgitation 05/17/2015  . Accidental drug overdose 05/17/2015  . Respiratory failure with hypoxia and hypercapnia ([place]) 05/09/2015  . OSA (obstructive sleep apnea) 05/09/2015  . Overdose 05/09/2015  . Chronic back pain 03/20/2015  . Right shoulder pain 11/14/2014  . Insomnia 11/14/2014  . Psychoses ([place])   . Bacteremia   . Pedal edema 09/12/2014  . Bipolar disorder, current episode manic without psychotic features, severe ([place])   . Bipolar disorder ([place]) 09/05/2014  . Bipolar affective disorder, current episode manic with psychotic symptoms ([place]) 09/05/2014  . Bipolar affective disorder, manic ([place]) 09/03/2014  . Encounter for preadmission testing   . Head revolving around 07/27/2014  . BP (high blood pressure) 05/23/2014  . AF (paroxysmal atrial fibrillation) ([place]) 05/23/2014  . Type 2 diabetes mellitus ([place]) 04/27/2014  . Essential hypertension 04/27/2014  . Hypothyroidism 04/27/2014  . Tobacco abuse 04/27/2014  . Bipolar I disorder ([place]) 04/20/2014  . Acute encephalopathy 04/18/2014  . Acute respiratory failure with hypoxia ([place])    . Encounter for feeding tube placement   . Shortness of breath   . Gout 08/25/2013  . 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 HLD (hyperlipidemia) 08/30/2009   Past Medical History:  Past Medical History:  Diagnosis Date  . Anxiety   . Bipolar 1 disorder ([place])   . Cancer [facility])    cancer of uterus...no chemo or radiation  . Chicken pox   . Depression   . Diabetes mellitus    dx within last yr....just takes pills  . Dyslipidemia   . Hypothyroid   . Retinal detachment   . Sleep apnea    Past Surgical History:  Past Surgical History:  Procedure Laterality Date  . ABDOMINAL HYSTERECTOMY    . IR 3D INDEPENDENT WKST  03/13/2017  . IR ANGIO INTRA EXTRACRAN SEL COM CAROTID INNOMINATE UNI L MOD SED  03/13/2017  . IR ANGIO INTRA EXTRACRAN SEL INTERNAL CAROTID UNI R MOD SED  03/13/2017  . IR ANGIO VERTEBRAL SEL SUBCLAVIAN INNOMINATE UNI R MOD SED  03/13/2017  . IR ANGIOGRAM FOLLOW UP STUDY  03/13/2017  . IR CT HEAD LTD  03/13/2017  . IR RADIOLOGIST EVAL & MGMT  02/19/2017  . IR TRANSCATH/EMBOLIZ  03/13/2017  . LUMBAR LAMINECTOMY/DECOMPRESSION MICRODISCECTOMY  07/17/2011   Procedure: LUMBAR LAMINECTOMY/DECOMPRESSION MICRODISCECTOMY;  Surgeon: Sinclair Ship, MD;  Location: [facility];  Service: Orthopedics;  Laterality: Left;  Left sided lumbar 4-5 microdisectomy  . RADIOLOGY WITH ANESTHESIA N/A 03/13/2017   Procedure: EMBOLIZATION;  Surgeon: Luanne Bras, MD;  Location: [facility];  Service: Radiology;  Laterality: N/A;  . TEE WITHOUT CARDIOVERSION N/A 03/17/2017   Procedure: TRANSESOPHAGEAL ECHOCARDIOGRAM (TEE);  Surgeon: Dorothy Spark, MD;  Location: [facility];  Service: Cardiovascular;  Laterality: N/A;  . TONSILLECTOMY    . TOTAL ABDOMINAL HYSTERECTOMY W/ BILATERAL SALPINGOOPHORECTOMY     HPI:  67 year old female with history of hypertension, hypothyroidism, OSA, diastolic CHF, diabetes mellitus type 2, bilateral ICA aneurysm presented with slurred speech and difficulty walking.  She  was found to be hypoxic and CT angiogram showed left-sided lower lobe pneumonia. MRI negative, TIA suspected   Assessment / Plan / Recommendation Clinical Impression  Pt demonstrates mild cognitive deficits in areas of short term memory primarily. Pt able to describe basic ADLs and baseline use of strategies for vision loss, financial management and organization. Speech and language WNL. Recommend increased level of supervision at d/c though dedicated SLP intervention not warranted. Expect ongoing improvement in mentation as acute illness improves. Will sign off for cognitive linguistic interventions.     SLP Assessment  SLP Recommendation/Assessment: Patient does not need any further Speech Lanaguage Pathology Services SLP Visit Diagnosis: Cognitive communication deficit (R41.841)    Follow Up Recommendations  None    Frequency and Duration           SLP Evaluation Cognition  Overall Cognitive Status: Impaired/Different from baseline Arousal/Alertness: Awake/alert Orientation Level: Oriented to person;Oriented to place;Oriented to time;Oriented to situation Attention: Sustained;Alternating Sustained Attention: Appears intact Memory: Impaired Memory Impairment: Retrieval deficit Awareness: Appears intact Problem Solving: Appears intact       Comprehension  Auditory Comprehension Overall Auditory Comprehension: Appears within functional limits for tasks assessed    Expression Verbal Expression Overall Verbal Expression: Appears within functional limits for tasks assessed   Oral / Motor  Oral Motor/Sensory Function Overall Oral Motor/Sensory Function: Within functional limits Motor Speech Overall Motor Speech: Appears within functional limits for tasks assessed   GO                   Herbie Baltimore, MA CCC-SLP [phone]  Lynann Beaver 05/04/2017, 11:31 AM

## 2017-05-04 NOTE — Evaluation (Signed)
Physical Therapy Evaluation Patient Details Name: Kelsey Hanna MRN: [id number] DOB: [birth year] Today's Date: 05/04/2017   History of Present Illness  67 year old female with history of hypertension, hypothyroidism, OSA, diastolic CHF, diabetes mellitus type 2, bilateral ICA aneurysm presented with slurred speech and difficulty walking.  She was found to be hypoxic and CT angiogram showed left-sided lower lobe pneumonia.   Clinical Impression  Pt admitted with/for s/s of stroke, but MRI negative.  Pt found to have PNA.  Pt is not at baseline, and is still weak and unsteady with gait, needing min guard for safety..  Pt currently limited functionally due to the problems listed below.  (see problems list.)  Pt will benefit from PT to maximize function and safety to be able to get home safely with available assist.     Follow Up Recommendations Home health PT;Supervision - Intermittent    Equipment Recommendations  None recommended by PT    Recommendations for Other Services       Precautions / Restrictions Precautions Precautions: Fall      Mobility  Bed Mobility Overal bed mobility: Needs Assistance Bed Mobility: Supine to Sit;Sit to Supine     Supine to sit: Min guard Sit to supine: Min guard   General bed mobility comments: HOB raised mildly with OOB, but no assist no rails.  Entered head first.  Transfers Overall transfer level: Needs assistance   Transfers: Sit to/from Stand Sit to Stand: Min guard            Ambulation/Gait Ambulation/Gait assistance: Min guard Ambulation Distance (Feet): 45 Feet Assistive device: None Gait Pattern/deviations: Step-through pattern   Gait velocity interpretation: Below normal speed for age/gender General Gait Details: pt wanted to stay close to the rail and reach for it several times.  Mildly unsteady overall, but no overt LOB.  Stairs            Wheelchair Mobility    Modified Rankin (Stroke Patients Only)        Balance Overall balance assessment: Needs assistance   Sitting balance-Leahy Scale: Fair Sitting balance - Comments: MMT was a challenge for her balance and soon pt was listing backward.   Standing balance support: No upper extremity supported Standing balance-Leahy Scale: Fair                               Pertinent Vitals/Pain Pain Assessment: No/denies pain    Home Living Family/patient expects to be discharged to:: Private residence Living Arrangements: Alone Available Help at Discharge: Personal care attendant;Other (Comment) Type of Home: House Home Access: Level entry     Home Layout: One level Home Equipment: Walker - 2 wheels;Cane - single point Additional Comments: Daughter lives two blocks away    Prior Function Level of Independence: Independent         Comments: pt reports using SCAT and  ACT for assist going places.     Hand Dominance        Extremity/Trunk Assessment   Upper Extremity Assessment Upper Extremity Assessment: Overall WFL for tasks assessed    Lower Extremity Assessment Lower Extremity Assessment: Overall WFL for tasks assessed(proximal weakness L>R)       Communication   Communication: No difficulties  Cognition Arousal/Alertness: Awake/alert Behavior During Therapy: WFL for tasks assessed/performed Overall Cognitive Status: Impaired/Different from baseline Area of Impairment: Orientation  Orientation Level: Place;Situation             General Comments: pt did not know she was in the hospital and thought she was out with her daughter last night.      General Comments General comments (skin integrity, edema, etc.): sats 95% on RA with activity and EHR 92 bpm    Exercises     Assessment/Plan    PT Assessment Patient needs continued PT services  PT Problem List Decreased activity tolerance;Decreased balance;Decreased mobility;Decreased strength       PT Treatment  Interventions Gait training;DME instruction;Functional mobility training;Therapeutic activities;Patient/family education    PT Goals (Current goals can be found in the Care Plan section)  Acute Rehab PT Goals Patient Stated Goal: be back to my normal level PT Goal Formulation: With patient Time For Goal Achievement: 05/11/17 Potential to Achieve Goals: Good    Frequency Min 3X/week   Barriers to discharge        Co-evaluation               AM-PAC PT "6 Clicks" Daily Activity  Outcome Measure Difficulty turning over in bed (including adjusting bedclothes, sheets and blankets)?: A Little Difficulty moving from lying on back to sitting on the side of the bed? : A Little Difficulty sitting down on and standing up from a chair with arms (e.g., wheelchair, bedside commode, etc,.)?: A Little Help needed moving to and from a bed to chair (including a wheelchair)?: A Little Help needed walking in hospital room?: A Little Help needed climbing 3-5 steps with a railing? : A Little 6 Click Score: 18    End of Session   Activity Tolerance: Patient tolerated treatment well Patient left: in bed;with call bell/phone within reach;with bed alarm set Nurse Communication: Mobility status PT Visit Diagnosis: Unsteadiness on feet (R26.81)    Time: 3491-7915 PT Time Calculation (min) (ACUTE ONLY): 26 min   Charges:   PT Evaluation $PT Eval Moderate Complexity: 1 Mod PT Treatments $Gait Training: 8-22 mins   PT G Codes:        May 09, 2017  Kelsey Hanna, PT [phone]  (pager)  Kelsey Hanna Kelsey Hanna 05-09-2017, 10:58 AM

## 2017-05-04 NOTE — Progress Notes (Addendum)
Patient ID: Kelsey Hanna, female   DOB: 25-Sep-1950, 67 y.o.   MRN: [id number]  PROGRESS NOTE    Kelsey Hanna  LOV:[id number] DOB: 1950-07-30 DOA: 05/02/2017 PCP: Lance Sell, NP   Brief Narrative:  67 year old female with history of hypertension, hypothyroidism, OSA, diastolic CHF, diabetes mellitus type 2, bilateral ICA aneurysm presented with slurred speech and difficulty walking.  She was found to be hypoxic and CT angiogram showed left-sided lower lobe pneumonia.  MRI of the brain was negative for acute infarct.  She was started on broad-spectrum antibiotics.   Assessment & Plan:   Principal Problem:   Acute respiratory failure with hypoxia (HCC) Active Problems:   Bipolar I disorder (HCC)   Type 2 diabetes mellitus (HCC)   Hypothyroidism   Chronic diastolic CHF (congestive heart failure) (HCC)   Sepsis ([place])  Acute respiratory failure with hypoxia -Probably second to pneumonia.  Resolved.  Currently on room air   SIRS/sepsis secondary to suspected aspiration  vs. healthcare associated pneumonia -Hemodynamically improving.  Will repeat AM labs.  -Antibiotics plan as below  -Cultures negative so far  Pneumonia: Probably aspiration versus healthcare associated pneumonia -Continue cefepime.  Discontinue vancomycin.  Probable switch to oral antibiotics tomorrow. -SLP evaluation. -Currently on room air  Diastolic CHF -Currently compensated.  Patient's last echocardiogram was on 1/29 showing EF of 65-70% with grade 1 diastolic dysfunction - Strict I&O's - Daily weights  Probable TIA causing ataxia and dysarthria in a patient with history of CVA: Now resolved.  -MRI negative for acute stroke.  I have spoken to Dr. Lindzen/neurology regarding the patient on phone on 05/03/2017 and since that MRI was negative and symptoms have resolved, patient did not need to be seen by neurology. -Outpatient follow-up with neurology -Continue aspirin and Plavix and  statin -PT/OT/SLP evaluation  -TEE on 03/17/2017 had shown EF of 65-70%  Elevated troponin -Probably secondary to hypoxia and pneumonia.  Troponins trended down.  No further workup.   H/O bilateral ICA aneurysms:  Status post IR guided coiling on 03/13/2017.  Outpatient follow-up  Hypothyroidism:  - Continue levothyroxine  Bipolar disorder - Continue olanzapine.  Outpatient follow-up with psychiatric  Hyperlipidemia - Continue atorvastatin   DVT prophylaxis: Lovenox Code Status: Full Family Communication: None at bedside Disposition Plan: Home in 1-2 days if clinically remains stable  Consultants: None  Procedures: None  Antimicrobials: Vancomycin and cefepime from 05/02/2017 onwards   Subjective: Patient seen and examined at bedside.  She feels much better.  Her breathing is improving.  No overnight fever, nausea or vomiting.  Objective: Vitals:   05/03/17 1648 05/03/17 1742 05/03/17 2148 05/04/17 0500  BP: 128/65 (!) 147/76 (!) 148/80 119/60  Pulse: 90 89 90 82  Resp: 19 20 18 18   Temp:  97.9 F (36.6 C) 98.2 F (36.8 C) (!) 97.4 F (36.3 C)  TempSrc:  Oral Oral Oral  SpO2: 99% 96% 99% 94%    Intake/Output Summary (Last 24 hours) at 05/04/2017 1001 Last data filed at 05/04/2017 0935 Gross per 24 hour  Intake 1040 ml  Output 450 ml  Net 590 ml   There were no vitals filed for this visit.  Examination:  General exam: Appears calm and comfortable  Respiratory system: Bilateral decreased breath sound at bases with scattered crackles Cardiovascular system: S1 & S2 heard, rate controlled  gastrointestinal system: Abdomen is nondistended, soft and nontender. Normal bowel sounds heard. Central nervous system: Alert and oriented. No focal neurological deficits. Moving extremities Extremities: No  cyanosis, clubbing, edema  Skin: No rashes, lesions or ulcers Psychiatry: Judgement and insight appear normal. Mood & affect appropriate.     Data Reviewed: I  have personally reviewed following labs and imaging studies  CBC: Recent Labs  Lab 05/02/17 2008  WBC 18.7*  HGB 12.8  HCT 41.0  MCV 92.1  PLT 119   Basic Metabolic Panel: Recent Labs  Lab 05/02/17 2008  NA 136  K 4.3  CL 103  CO2 21*  GLUCOSE 106*  BUN 10  CREATININE 0.94  CALCIUM 8.8*   GFR: Estimated Creatinine Clearance: 61.9 mL/min (by C-G formula based on SCr of 0.94 mg/dL). Liver Function Tests: No results for input(s): AST, ALT, ALKPHOS, BILITOT, PROT, ALBUMIN in the last 168 hours. No results for input(s): LIPASE, AMYLASE in the last 168 hours. Recent Labs  Lab 05/02/17 2008  AMMONIA 36*   Coagulation Profile: Recent Labs  Lab 05/02/17 2008  INR 1.10   Cardiac Enzymes: Recent Labs  Lab 05/03/17 0214 05/03/17 0856 05/03/17 1445  TROPONINI 0.09* 0.05* <0.03   BNP (last 3 results) No results for input(s): PROBNP in the last 8760 hours. HbA1C: No results for input(s): HGBA1C in the last 72 hours. CBG: Recent Labs  Lab 05/02/17 1942  GLUCAP 117*   Lipid Profile: No results for input(s): CHOL, HDL, LDLCALC, TRIG, CHOLHDL, LDLDIRECT in the last 72 hours. Thyroid Function Tests: Recent Labs    05/02/17 2008  TSH 0.030*   Anemia Panel: No results for input(s): VITAMINB12, FOLATE, FERRITIN, TIBC, IRON, RETICCTPCT in the last 72 hours. Sepsis Labs: Recent Labs  Lab 05/03/17 0227  LATICACIDVEN 0.78    No results found for this or any previous visit (from the past 240 hour(s)).       Radiology Studies: Dg Chest 2 View  Result Date: 05/02/2017 CLINICAL DATA:  Hypoxia and tachycardia EXAM: CHEST - 2 VIEW COMPARISON:  March 12, 2017 FINDINGS: There is atelectatic change in the posterior left base. Lungs elsewhere clear. Heart size and pulmonary vascularity are normal. No adenopathy. There is aortic atherosclerosis. No bone lesions. IMPRESSION: Posterior left base atelectasis. A small focus of pneumonia in this area cannot be entirely  excluded by radiography. Lungs elsewhere clear. No adenopathy. Heart size normal. There is aortic atherosclerosis. Aortic Atherosclerosis (ICD10-I70.0). Electronically Signed   By: Lowella Grip III M.D.   On: 05/02/2017 21:57   Ct Angio Chest Pe W And/or Wo Contrast  Result Date: 05/03/2017 CLINICAL DATA:  Hypoxia.  Leukocytosis. EXAM: CT ANGIOGRAPHY CHEST WITH CONTRAST TECHNIQUE: Multidetector CT imaging of the chest was performed using the standard protocol during bolus administration of intravenous contrast. Multiplanar CT image reconstructions and MIPs were obtained to evaluate the vascular anatomy. CONTRAST:  17mL ISOVUE-370 IOPAMIDOL (ISOVUE-370) INJECTION 76% COMPARISON:  Chest radiograph from one day prior. 10/04/2014 chest CT. FINDINGS: Cardiovascular: The study is moderate quality for the evaluation of pulmonary embolism, with evaluation of the segmental and subsegmental arteries limited by motion artifact. There are no filling defects in the central, lobar, segmental or subsegmental pulmonary artery branches to suggest acute pulmonary embolism. Atherosclerotic nonaneurysmal thoracic aorta. Normal caliber pulmonary arteries. Normal heart size. No significant pericardial fluid/thickening. Left main and three-vessel coronary atherosclerosis. Mediastinum/Nodes: No discrete thyroid nodules. Unremarkable esophagus. No pathologically enlarged axillary, mediastinal or hilar lymph nodes. Lungs/Pleura: No pneumothorax. No pleural effusion. Moderate centrilobular emphysema with mild diffuse bronchial wall thickening. Patchy left lower lobe consolidation and ground-glass opacity and tree-in-bud opacities. Subsegmental scarring versus atelectasis in the dependent  right lower lobe. No discrete lung masses or significant pulmonary nodules. Upper abdomen: No acute abnormality. Musculoskeletal: No aggressive appearing focal osseous lesions. Healed deformities throughout the posterior mid to lower left ribs. Mild  thoracic spondylosis. Review of the MIP images confirms the above findings. IMPRESSION: 1. Motion degraded scan.  No evidence of pulmonary embolism. 2. Patchy left lower lobe consolidation and tree-in-bud and ground-glass opacities, compatible with pneumonia. Follow-up post treatment chest radiographs advised to document resolution. 3. Left main and 3 vessel coronary atherosclerosis. Aortic Atherosclerosis (ICD10-I70.0) and Emphysema (ICD10-J43.9). Electronically Signed   By: Ilona Sorrel M.D.   On: 05/03/2017 01:19   Mr Angiogram Head Wo Contrast  Result Date: 05/03/2017 CLINICAL DATA:  Slurred speech and difficulty walking EXAM: MRI HEAD WITHOUT CONTRAST MRA HEAD WITHOUT CONTRAST TECHNIQUE: Multiplanar, multiecho pulse sequences of the brain and surrounding structures were obtained without intravenous contrast. Angiographic images of the head were obtained using MRA technique without contrast. COMPARISON:  Head CT 03/12/2017 FINDINGS: MRI HEAD FINDINGS Brain: The midline structures are normal. No focal diffusion restriction to indicate acute infarct. No intraparenchymal hemorrhage. Multifocal periventricular white matter hyperintensity, most often a result of chronic microvascular ischemia. No mass lesion. No chronic microhemorrhage or cerebral amyloid angiopathy. Mildly age advanced atrophy without lobar predilection. No dural abnormality or extra-axial collection. Skull and upper cervical spine: The visualized skull base, calvarium, upper cervical spine and extracranial soft tissues are normal. Sinuses/Orbits: No fluid levels or advanced mucosal thickening. No mastoid effusion. Normal orbits. MRA HEAD FINDINGS Intracranial internal carotid arteries: There is susceptibility artifact at the supraclinoid right ICA, at the site of known endovascular stent. Small amount of flow related enhancement at the medial aspect of the stent. 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 Anterior  cerebral arteries: Normal. Congenital variant diminutive right A1 segment. Middle cerebral arteries: Normal. Posterior communicating arteries: Present on the right. Posterior cerebral arteries: Fetal origin of the right PCA. Normal left PCA. Basilar artery: Normal. Vertebral arteries: Left dominant. Normal. Superior cerebellar arteries: Normal. Anterior inferior cerebellar arteries: Normal. Posterior inferior cerebellar arteries: Normal. IMPRESSION: 1. Chronic ischemic microangiopathy and mildly age advanced atrophy without acute intracranial abnormality. 2. Minimal residual flow related enhancement at the medial aspect of the treated right ICA ophthalmic segment aneurysm, possibly indicating a small amount of residual patency of the aneurysm sac. 3. 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 Electronically Signed   By: Ulyses Jarred M.D.   On: 05/03/2017 00:04   Mr Angiogram Neck W Or Wo Contrast  Result Date: 05/03/2017 CLINICAL DATA:  Difficulty walking.  Slurred speech. EXAM: MRA NECK WITHOUT AND WITH CONTRAST TECHNIQUE: Multiplanar and multiecho pulse sequences of the neck were obtained without and with intravenous contrast. Angiographic images of the neck were obtained using MRA technique without and with intravenous contrast. CONTRAST:  20mL MULTIHANCE GADOBENATE DIMEGLUMINE 529 MG/ML IV SOLN COMPARISON:  None. FINDINGS: There is a normal 3 vessel branching pattern of the aortic arch. The visualized proximal subclavian arteries are normal. The right common carotid artery origin is widely patent. There is no stenosis of the internal carotid artery. Normal appearance of the right external carotid artery. The origin of the left common carotid artery is widely patent. Normal carotid bifurcation. Normal left internal carotid artery without stenosis. Normal appearance of the left external carotid artery. The vertebral system is codominant. There is moderate-to-severe stenosis of both  vertebral artery origins. The vertebral arteries are otherwise normal to the vertebrobasilar confluence.  Limited images of the neck soft tissues are normal. IMPRESSION: 1. No internal carotid artery stenosis or acute abnormality. 2. 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 Otherwise normal vertebral arteries. Electronically Signed   By: Ulyses Jarred M.D.   On: 05/03/2017 00:33   Mr Brain Wo Contrast  Result Date: 05/03/2017 CLINICAL DATA:  Slurred speech and difficulty walking EXAM: MRI HEAD WITHOUT CONTRAST MRA HEAD WITHOUT CONTRAST TECHNIQUE: Multiplanar, multiecho pulse sequences of the brain and surrounding structures were obtained without intravenous contrast. Angiographic images of the head were obtained using MRA technique without contrast. COMPARISON:  Head CT 03/12/2017 FINDINGS: MRI HEAD FINDINGS Brain: The midline structures are normal. No focal diffusion restriction to indicate acute infarct. No intraparenchymal hemorrhage. Multifocal periventricular white matter hyperintensity, most often a result of chronic microvascular ischemia. No mass lesion. No chronic microhemorrhage or cerebral amyloid angiopathy. Mildly age advanced atrophy without lobar predilection. No dural abnormality or extra-axial collection. Skull and upper cervical spine: The visualized skull base, calvarium, upper cervical spine and extracranial soft tissues are normal. Sinuses/Orbits: No fluid levels or advanced mucosal thickening. No mastoid effusion. Normal orbits. MRA HEAD FINDINGS Intracranial internal carotid arteries: There is susceptibility artifact at the supraclinoid right ICA, at the site of known endovascular stent. Small amount of flow related enhancement at the medial aspect of the stent. 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 Anterior cerebral arteries: Normal. Congenital variant diminutive right A1 segment. Middle cerebral arteries: Normal. Posterior  communicating arteries: Present on the right. Posterior cerebral arteries: Fetal origin of the right PCA. Normal left PCA. Basilar artery: Normal. Vertebral arteries: Left dominant. Normal. Superior cerebellar arteries: Normal. Anterior inferior cerebellar arteries: Normal. Posterior inferior cerebellar arteries: Normal. IMPRESSION: 1. Chronic ischemic microangiopathy and mildly age advanced atrophy without acute intracranial abnormality. 2. Minimal residual flow related enhancement at the medial aspect of the treated right ICA ophthalmic segment aneurysm, possibly indicating a small amount of residual patency of the aneurysm sac. 3. 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 Electronically Signed   By: Ulyses Jarred M.D.   On: 05/03/2017 00:04        Scheduled Meds: .  stroke: mapping our early stages of recovery book   Does not apply Once  . aspirin  325 mg Oral Daily  . atorvastatin  40 mg Oral QPM  . clonazePAM  0.5 mg Oral BID  . clopidogrel  75 mg Oral Daily  . enoxaparin (LOVENOX) injection  40 mg Subcutaneous Daily  . levothyroxine  150 mcg Oral QAC breakfast  . OLANZapine  20 mg Oral QHS   Continuous Infusions: . ceFEPime (MAXIPIME) IV Stopped (05/04/17 0322)  . vancomycin Stopped (05/04/17 0053)     LOS: 1 day        Aline August, MD [facility] Pager [phone]  If 7PM-7AM, please contact night-coverage [email or web address] Password [email or web address] 05/04/2017, 10:01 AM

## 2017-05-04 NOTE — Evaluation (Signed)
Occupational Therapy Evaluation Patient Details Name: Kelsey Hanna MRN: [id number] DOB: 1950/08/01 Today's Date: 05/04/2017    History of Present Illness 67 year old female with history of hypertension, hypothyroidism, OSA, diastolic CHF, diabetes mellitus type 2, bilateral ICA aneurysm presented with slurred speech and difficulty walking.  She was found to be hypoxic and CT angiogram showed left-sided lower lobe pneumonia.    Clinical Impression   Pt was independent in self care and walked with a RW prior to admission. She reports her aide helped with IADL. Pt with baseline impaired cognition and impaired vision. Pt demonstrating ability to perform ADL at a min guard level. Will follow acutely. Pt needs 24 hour care at home.     Follow Up Recommendations  Home health OT;Supervision/Assistance - 24 hour    Equipment Recommendations  None recommended by OT    Recommendations for Other Services       Precautions / Restrictions Precautions Precautions: Fall Restrictions Weight Bearing Restrictions: No      Mobility Bed Mobility Overal bed mobility: Needs Assistance Bed Mobility: Supine to Sit     Supine to sit: Supervision     General bed mobility comments: no assist, HOB up slightly  Transfers Overall transfer level: Needs assistance Equipment used: Rolling walker (2 wheeled) Transfers: Sit to/from Stand Sit to Stand: Min guard         General transfer comment: pt standing before RW in front    Balance Overall balance assessment: Needs assistance   Sitting balance-Leahy Scale: Good Sitting balance - Comments: No LOB with donning socks     Standing balance-Leahy Scale: Fair                             ADL either performed or assessed with clinical judgement   ADL Overall ADL's : Needs assistance/impaired Eating/Feeding: Independent;Sitting   Grooming: Wash/dry hands;Min guard;Standing   Upper Body Bathing: Minimal assistance;Sitting    Lower Body Bathing: Minimal assistance;Sit to/from stand   Upper Body Dressing : Set up;Sitting   Lower Body Dressing: Sit to/from stand;Min guard Lower Body Dressing Details (indicate cue type and reason): can don socks Toilet Transfer: Ambulation;BSC;RW;Min guard   Toileting- Clothing Manipulation and Hygiene: Minimal assistance;Sit to/from stand       Functional mobility during ADLs: Rolling walker;Min guard       Vision Patient Visual Report: No change from baseline Pt with no vision in L and low vision in R       Perception     Praxis      Pertinent Vitals/Pain Pain Assessment: No/denies pain     Hand Dominance Right   Extremity/Trunk Assessment Upper Extremity Assessment Upper Extremity Assessment: Overall WFL for tasks assessed   Lower Extremity Assessment Lower Extremity Assessment: Defer to PT evaluation       Communication Communication Communication: No difficulties   Cognition Arousal/Alertness: Awake/alert Behavior During Therapy: WFL for tasks assessed/performed Overall Cognitive Status: No family/caregiver present to determine baseline cognitive functioning Area of Impairment: Orientation;Memory                 Orientation Level: Place;Situation   Memory: Decreased short-term memory         General Comments: pt unaware she was in the hospital   General Comments       Exercises     Shoulder Instructions      Home Living Family/patient expects to be discharged to:: Private residence Living Arrangements: Alone  Available Help at Discharge: Personal care attendant Type of Home: House Home Access: Level entry     Home Layout: One level     Bathroom Shower/Tub: Teacher, early years/pre: Handicapped height     Home Equipment: Environmental consultant - 2 wheels;Cane - single point   Additional Comments: plans to go to her daughter's home  Lives With: Alone    Prior Functioning/Environment Level of Independence: Needs  assistance  Gait / Transfers Assistance Needed: walks with a walker ADL's / Homemaking Assistance Needed: assisted for some meal prep and housekeeping   Comments: pt reports using SCAT and  ACT for assist going places.        OT Problem List: Impaired balance (sitting and/or standing);Decreased cognition;Decreased safety awareness;Decreased knowledge of use of DME or AE      OT Treatment/Interventions: Self-care/ADL training    OT Goals(Current goals can be found in the care plan section) Acute Rehab OT Goals Patient Stated Goal: be back to my normal level OT Goal Formulation: With patient Time For Goal Achievement: 05/18/17 Potential to Achieve Goals: Good ADL Goals Pt Will Perform Grooming: with supervision;standing Pt Will Perform Lower Body Bathing: with supervision;sit to/from stand Pt Will Perform Lower Body Dressing: with supervision;sit to/from stand Pt Will Transfer to Toilet: with supervision;ambulating Pt Will Perform Toileting - Clothing Manipulation and hygiene: with supervision;sit to/from stand  OT Frequency: Min 2X/week   Barriers to D/C:            Co-evaluation              AM-PAC PT "6 Clicks" Daily Activity     Outcome Measure Help from another person eating meals?: None Help from another person taking care of personal grooming?: A Little Help from another person toileting, which includes using toliet, bedpan, or urinal?: A Little Help from another person bathing (including washing, rinsing, drying)?: A Little Help from another person to put on and taking off regular upper body clothing?: None Help from another person to put on and taking off regular lower body clothing?: A Little 6 Click Score: 20   End of Session Equipment Utilized During Treatment: Rolling walker;Gait belt  Activity Tolerance: Patient tolerated treatment well Patient left: in chair;with call bell/phone within reach;with chair alarm set  OT Visit Diagnosis: Unsteadiness on  feet (R26.81)                Time: 9983-3825 OT Time Calculation (min): 30 min Charges:  OT General Charges $OT Visit: 1 Visit OT Evaluation $OT Eval Moderate Complexity: 1 Mod OT Treatments $Self Care/Home Management : 8-22 mins G-Codes:     05-31-2017 Nestor Lewandowsky, OTR/L Pager: [phone]  Werner Lean Haze Boyden 2017-05-31, 4:53 PM

## 2017-05-05 ENCOUNTER — Other Ambulatory Visit: Payer: Self-pay

## 2017-05-05 DIAGNOSIS — G9340 Encephalopathy, unspecified: Secondary | ICD-10-CM

## 2017-05-05 LAB — CBC WITH DIFFERENTIAL/PLATELET
Basophils Absolute: 0 10*3/uL (ref 0.0–0.1)
Basophils Relative: 0 %
EOS PCT: 1 %
Eosinophils Absolute: 0.1 10*3/uL (ref 0.0–0.7)
HCT: 33.3 % — ABNORMAL LOW (ref 36.0–46.0)
Hemoglobin: 10.7 g/dL — ABNORMAL LOW (ref 12.0–15.0)
LYMPHS ABS: 2 10*3/uL (ref 0.7–4.0)
LYMPHS PCT: 25 %
MCH: 28.8 pg (ref 26.0–34.0)
MCHC: 32.1 g/dL (ref 30.0–36.0)
MCV: 89.8 fL (ref 78.0–100.0)
MONO ABS: 0.4 10*3/uL (ref 0.1–1.0)
Monocytes Relative: 5 %
Neutro Abs: 5.5 10*3/uL (ref 1.7–7.7)
Neutrophils Relative %: 69 %
Platelets: 235 10*3/uL (ref 150–400)
RBC: 3.71 MIL/uL — ABNORMAL LOW (ref 3.87–5.11)
RDW: 14 % (ref 11.5–15.5)
WBC: 8 10*3/uL (ref 4.0–10.5)

## 2017-05-05 LAB — BASIC METABOLIC PANEL
Anion gap: 13 (ref 5–15)
BUN: 12 mg/dL (ref 6–20)
CO2: 22 mmol/L (ref 22–32)
Calcium: 9.3 mg/dL (ref 8.9–10.3)
Chloride: 108 mmol/L (ref 101–111)
Creatinine, Ser: 0.63 mg/dL (ref 0.44–1.00)
GFR calc Af Amer: >60 mL/min (ref >60)
GLUCOSE: 109 mg/dL — AB (ref 65–99)
POTASSIUM: 3.5 mmol/L (ref 3.5–5.1)
Sodium: 143 mmol/L (ref 135–145)

## 2017-05-05 LAB — MAGNESIUM: Magnesium: 1.7 mg/dL (ref 1.7–2.4)

## 2017-05-05 MED ORDER — OXYCODONE HCL 5 MG PO TABS
10.0000 mg | ORAL_TABLET | Freq: Four times a day (QID) | ORAL | Status: DC | PRN
  Administered 2017-05-05: 10 mg via ORAL
  Filled 2017-05-05: qty 2

## 2017-05-05 MED ORDER — AMOXICILLIN-POT CLAVULANATE 875-125 MG PO TABS: 1.0000 | ORAL_TABLET | Freq: Two times a day (BID) | ORAL | 0 refills | Status: AC

## 2017-05-05 MED ORDER — AMOXICILLIN-POT CLAVULANATE 875-125 MG PO TABS
1.0000 | ORAL_TABLET | Freq: Two times a day (BID) | ORAL | Status: DC
  Administered 2017-05-05: 1 via ORAL
  Filled 2017-05-05: qty 1

## 2017-05-05 NOTE — Care Management Note (Signed)
Case Management Note  Patient Details  Name: Kelsey Hanna MRN: [id number] Date of Birth: January 21, 1951  Subjective/Objective:   Acute respiratory failure.           PCP: Caesar Chestnut  Action/Plan: Transition to home with home health services to follow. Daughter to provide transportation to home.  Expected Discharge Date:  05/05/17               Expected Discharge Plan:  [place]  In-House Referral:     Discharge planning Services  CM Consult  Post Acute Care Choice:   N/A Choice offered to:  Patient  DME Arranged:   N/A DME Agency:    N/A HH Arranged:  RN, PT HH Agency:  [place]  Status of Service:  Completed, signed off  If discussed at [place] of Stay Meetings, dates discussed:    Additional Comments:  Sharin Mons, RN 05/05/2017, 10:12 AM

## 2017-05-05 NOTE — Progress Notes (Signed)
Kelsey Hanna to be D/C'd Home per MD order.  Discussed with the patient and all questions fully answered.  VSS, Skin clean, dry and intact without evidence of skin break down, no evidence of skin tears noted. IV catheter discontinued intact. Site without signs and symptoms of complications. Dressing and pressure applied.  An After Visit Summary was printed and given to the patient. Patient received prescription.  D/c education completed with patient/family including follow up instructions, medication list, d/c activities limitations if indicated, with other d/c instructions as indicated by MD - patient able to verbalize understanding, all questions fully answered.   Patient instructed to return to ED, call 911, or call MD for any changes in condition.   Pt waiting on family to pick her up. Will continue to assess.  [place] 05/05/2017 12:28 PM

## 2017-05-05 NOTE — Discharge Summary (Signed)
Physician Discharge Summary  Kelsey Hanna PFX:[id number] DOB: 01-09-1951 DOA: 05/02/2017  PCP: Lance Sell, NP  Admit date: 05/02/2017 Discharge date: 05/05/2017  Admitted From: Home Disposition:  Home  Recommendations for Outpatient Follow-up:  1. Follow up with PCP in 1 week with repeat CBC/BMP 2. Follow-up with neurology in 1-2 weeks and follow-up with psychiatry in 1-2 weeks 3. Follow-up with pain management as an outpatient   Home Health: Yes Equipment/Devices: None  Discharge Condition: Stable CODE STATUS: DNR.  Confirmed with the patient Diet recommendation: Heart Healthy   Brief/Interim Summary: 67 year old female with history of hypertension, hypothyroidism, OSA, diastolic CHF, diabetes mellitus type 2, bilateral ICA aneurysm presented with slurred speech and difficulty walking.  She was found to be hypoxic and CT angiogram showed left-sided lower lobe pneumonia.  MRI of the brain was negative for acute infarct.  She was started on broad-spectrum antibiotics.  Her respiratory status improved, currently on room air.  Cultures have been negative.  Afebrile.  Will discharge on oral antibiotics.   Discharge Diagnoses:  Principal Problem:   Acute respiratory failure with hypoxia (HCC) Active Problems:   Bipolar I disorder (HCC)   Type 2 diabetes mellitus (HCC)   Hypothyroidism   Chronic diastolic CHF (congestive heart failure) (HCC)   Sepsis ([place])  Acute respiratory failure with hypoxia -Probably secondary to pneumonia.  Resolved.  Currently on room air   SIRS/sepsis  with leukocytosis: secondary tosuspectedaspirationvs.healthcare associated pneumonia -Resolved.  Hemodynamically stable.  White count normal.   Antibiotics plan as below -Cultures negative so far  Pneumonia: Probably aspiration versus healthcare associated pneumonia -Started on cefepime and vancomycin.  Vancomycin was discontinued.  Discharge home on oral Augmentin for 5 more  days -Tolerating diet. -Currently on room air  Diastolic CHF -Currently compensated.  Patient's last echocardiogram was on 1/29 showing EF of 65-70% with grade 1 diastolic dysfunction -Outpatient follow-up  Probable TIA causing ataxia and dysarthria in a patient with history of CVA:Now resolved.  -MRI negative for acute stroke.  I haD spoken to Dr. Lindzen/neurology regarding the patient on phone on 05/03/2017 and since that MRI was negative and symptoms have resolved, patient did not need to be seen by neurology. -Outpatient follow-up with neurology -Continue aspirin and Plavix and statin -PT: Recommend home PT -TEE on 03/17/2017 had shown EF of 65-70% -Daughter Sharyn Lull was concerned on phone on 05/04/2017 that patient's overall cognition has been declining over the last few months.  Recommend outpatient neurology evaluation along with psychiatry evaluation.  Elevated troponin -Probably secondary to hypoxia and pneumonia.  Troponins trended down.  No further workup.  H/O bilateral ICA aneurysms: Status post IR guided coiling on 03/13/2017.  Outpatient follow-up  Hypothyroidism: -Continue levothyroxine  Bipolar disorder -Continue olanzapine.  Outpatient follow-up with psychiatrY  Hyperlipidemia -Continue atorvastatin     Discharge Instructions  Discharge Instructions    Ambulatory referral to Neurology   Complete by:  As directed    An appointment is requested in approximately: Altered mental status/?TIA; worsening cognition over few months as per daughter   Call MD for:  difficulty breathing, headache or visual disturbances   Complete by:  As directed    Call MD for:  extreme fatigue   Complete by:  As directed    Call MD for:  hives   Complete by:  As directed    Call MD for:  persistant dizziness or light-headedness   Complete by:  As directed    Call MD for:  persistant nausea and  vomiting   Complete by:  As directed    Call MD for:  severe uncontrolled  pain   Complete by:  As directed    Call MD for:  temperature >100.4   Complete by:  As directed    Diet - low sodium heart healthy   Complete by:  As directed    Increase activity slowly   Complete by:  As directed      Allergies as of 05/05/2017   No Known Allergies     Medication List    STOP taking these medications   cyclobenzaprine 5 MG tablet Commonly known as:  FLEXERIL   tiZANidine 4 MG tablet Commonly known as:  ZANAFLEX     TAKE these medications   amoxicillin-clavulanate 875-125 MG tablet Commonly known as:  AUGMENTIN Take 1 tablet by mouth every 12 (twelve) hours for 5 days.   aspirin 325 MG tablet Take 1 tablet (325 mg total) by mouth daily.   atorvastatin 40 MG tablet Commonly known as:  LIPITOR Take 1 tablet (40 mg total) by mouth every evening.   clonazePAM 0.5 MG tablet Commonly known as:  KLONOPIN Take 1 tablet (0.5 mg total) by mouth 2 (two) times daily. 1 in the morning and 1 midday   clopidogrel 75 MG tablet Commonly known as:  PLAVIX Take 75 mg by mouth daily.   docusate sodium 100 MG capsule Commonly known as:  DOCQLACE TAKE 1 TO 2 CAPSULES BY MOUTH TWICE A DAY AS NEEDED FOR MILD CONSTIPATION   gabapentin 100 MG capsule Commonly known as:  NEURONTIN Take 100 mg by mouth 3 (three) times daily.   levothyroxine 150 MCG tablet Commonly known as:  SYNTHROID, LEVOTHROID TAKE 1 TABLET BY MOUTH DAILY BEFORE BREAKFAST   linaclotide 145 MCG Caps capsule Commonly known as:  LINZESS Take 1 capsule (145 mcg total) by mouth daily before breakfast.   loratadine 10 MG tablet Commonly known as:  CLARITIN Take 1 tablet (10 mg total) by mouth daily.   OLANZapine 20 MG tablet Commonly known as:  ZYPREXA Take 20 mg by mouth at bedtime.   Oxycodone HCl 10 MG Tabs Take 1 tablet (10 mg total) by mouth every 6 (six) hours as needed (pain). What changed:  when to take this   zolpidem 5 MG tablet Commonly known as:  AMBIEN Take 1 tablet (5 mg  total) by mouth at bedtime as needed for sleep.      Follow-up Information    Lance Sell, NP. Schedule an appointment as soon as possible for a visit in 1 week(s).   Specialty:  Nurse Practitioner Why:  with CBC/BMP Contact information: [place] [id number] [phone]        Neurologist. Schedule an appointment as soon as possible for a visit in 1 week(s).        Psychiatrist. Schedule an appointment as soon as possible for a visit in 1 week(s).          No Known Allergies  Consultations:  None   Procedures/Studies: Dg Chest 2 View  Result Date: 05/02/2017 CLINICAL DATA:  Hypoxia and tachycardia EXAM: CHEST - 2 VIEW COMPARISON:  March 12, 2017 FINDINGS: There is atelectatic change in the posterior left base. Lungs elsewhere clear. Heart size and pulmonary vascularity are normal. No adenopathy. There is aortic atherosclerosis. No bone lesions. IMPRESSION: Posterior left base atelectasis. A small focus of pneumonia in this area cannot be entirely excluded by radiography. Lungs elsewhere clear. No  adenopathy. Heart size normal. There is aortic atherosclerosis. Aortic Atherosclerosis (ICD10-I70.0). Electronically Signed   By: Lowella Grip III M.D.   On: 05/02/2017 21:57   Ct Angio Chest Pe W And/or Wo Contrast  Result Date: 05/03/2017 CLINICAL DATA:  Hypoxia.  Leukocytosis. EXAM: CT ANGIOGRAPHY CHEST WITH CONTRAST TECHNIQUE: Multidetector CT imaging of the chest was performed using the standard protocol during bolus administration of intravenous contrast. Multiplanar CT image reconstructions and MIPs were obtained to evaluate the vascular anatomy. CONTRAST:  145mL ISOVUE-370 IOPAMIDOL (ISOVUE-370) INJECTION 76% COMPARISON:  Chest radiograph from one day prior. 10/04/2014 chest CT. FINDINGS: Cardiovascular: The study is moderate quality for the evaluation of pulmonary embolism, with evaluation of the segmental and subsegmental arteries limited by  motion artifact. There are no filling defects in the central, lobar, segmental or subsegmental pulmonary artery branches to suggest acute pulmonary embolism. Atherosclerotic nonaneurysmal thoracic aorta. Normal caliber pulmonary arteries. Normal heart size. No significant pericardial fluid/thickening. Left main and three-vessel coronary atherosclerosis. Mediastinum/Nodes: No discrete thyroid nodules. Unremarkable esophagus. No pathologically enlarged axillary, mediastinal or hilar lymph nodes. Lungs/Pleura: No pneumothorax. No pleural effusion. Moderate centrilobular emphysema with mild diffuse bronchial wall thickening. Patchy left lower lobe consolidation and ground-glass opacity and tree-in-bud opacities. Subsegmental scarring versus atelectasis in the dependent right lower lobe. No discrete lung masses or significant pulmonary nodules. Upper abdomen: No acute abnormality. Musculoskeletal: No aggressive appearing focal osseous lesions. Healed deformities throughout the posterior mid to lower left ribs. Mild thoracic spondylosis. Review of the MIP images confirms the above findings. IMPRESSION: 1. Motion degraded scan.  No evidence of pulmonary embolism. 2. Patchy left lower lobe consolidation and tree-in-bud and ground-glass opacities, compatible with pneumonia. Follow-up post treatment chest radiographs advised to document resolution. 3. Left main and 3 vessel coronary atherosclerosis. Aortic Atherosclerosis (ICD10-I70.0) and Emphysema (ICD10-J43.9). Electronically Signed   By: Ilona Sorrel M.D.   On: 05/03/2017 01:19   Mr Angiogram Head Wo Contrast  Result Date: 05/03/2017 CLINICAL DATA:  Slurred speech and difficulty walking EXAM: MRI HEAD WITHOUT CONTRAST MRA HEAD WITHOUT CONTRAST TECHNIQUE: Multiplanar, multiecho pulse sequences of the brain and surrounding structures were obtained without intravenous contrast. Angiographic images of the head were obtained using MRA technique without contrast.  COMPARISON:  Head CT 03/12/2017 FINDINGS: MRI HEAD FINDINGS Brain: The midline structures are normal. No focal diffusion restriction to indicate acute infarct. No intraparenchymal hemorrhage. Multifocal periventricular white matter hyperintensity, most often a result of chronic microvascular ischemia. No mass lesion. No chronic microhemorrhage or cerebral amyloid angiopathy. Mildly age advanced atrophy without lobar predilection. No dural abnormality or extra-axial collection. Skull and upper cervical spine: The visualized skull base, calvarium, upper cervical spine and extracranial soft tissues are normal. Sinuses/Orbits: No fluid levels or advanced mucosal thickening. No mastoid effusion. Normal orbits. MRA HEAD FINDINGS Intracranial internal carotid arteries: There is susceptibility artifact at the supraclinoid right ICA, at the site of known endovascular stent. Small amount of flow related enhancement at the medial aspect of the stent. 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 Anterior cerebral arteries: Normal. Congenital variant diminutive right A1 segment. Middle cerebral arteries: Normal. Posterior communicating arteries: Present on the right. Posterior cerebral arteries: Fetal origin of the right PCA. Normal left PCA. Basilar artery: Normal. Vertebral arteries: Left dominant. Normal. Superior cerebellar arteries: Normal. Anterior inferior cerebellar arteries: Normal. Posterior inferior cerebellar arteries: Normal. IMPRESSION: 1. Chronic ischemic microangiopathy and mildly age advanced atrophy without acute intracranial abnormality. 2. Minimal residual flow related enhancement at  the medial aspect of the treated right ICA ophthalmic segment aneurysm, possibly indicating a small amount of residual patency of the aneurysm sac. 3. 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 Electronically Signed   By: Ulyses Jarred M.D.   On: 05/03/2017 00:04   Mr Angiogram Neck W Or  Wo Contrast  Result Date: 05/03/2017 CLINICAL DATA:  Difficulty walking.  Slurred speech. EXAM: MRA NECK WITHOUT AND WITH CONTRAST TECHNIQUE: Multiplanar and multiecho pulse sequences of the neck were obtained without and with intravenous contrast. Angiographic images of the neck were obtained using MRA technique without and with intravenous contrast. CONTRAST:  76mL MULTIHANCE GADOBENATE DIMEGLUMINE 529 MG/ML IV SOLN COMPARISON:  None. FINDINGS: There is a normal 3 vessel branching pattern of the aortic arch. The visualized proximal subclavian arteries are normal. The right common carotid artery origin is widely patent. There is no stenosis of the internal carotid artery. Normal appearance of the right external carotid artery. The origin of the left common carotid artery is widely patent. Normal carotid bifurcation. Normal left internal carotid artery without stenosis. Normal appearance of the left external carotid artery. The vertebral system is codominant. There is moderate-to-severe stenosis of both vertebral artery origins. The vertebral arteries are otherwise normal to the vertebrobasilar confluence. Limited images of the neck soft tissues are normal. IMPRESSION: 1. No internal carotid artery stenosis or acute abnormality. 2. 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 Otherwise normal vertebral arteries. Electronically Signed   By: Ulyses Jarred M.D.   On: 05/03/2017 00:33   Mr Brain Wo Contrast  Result Date: 05/03/2017 CLINICAL DATA:  Slurred speech and difficulty walking EXAM: MRI HEAD WITHOUT CONTRAST MRA HEAD WITHOUT CONTRAST TECHNIQUE: Multiplanar, multiecho pulse sequences of the brain and surrounding structures were obtained without intravenous contrast. Angiographic images of the head were obtained using MRA technique without contrast. COMPARISON:  Head CT 03/12/2017 FINDINGS: MRI HEAD FINDINGS Brain: The midline structures are normal. No focal diffusion restriction to  indicate acute infarct. No intraparenchymal hemorrhage. Multifocal periventricular white matter hyperintensity, most often a result of chronic microvascular ischemia. No mass lesion. No chronic microhemorrhage or cerebral amyloid angiopathy. Mildly age advanced atrophy without lobar predilection. No dural abnormality or extra-axial collection. Skull and upper cervical spine: The visualized skull base, calvarium, upper cervical spine and extracranial soft tissues are normal. Sinuses/Orbits: No fluid levels or advanced mucosal thickening. No mastoid effusion. Normal orbits. MRA HEAD FINDINGS Intracranial internal carotid arteries: There is susceptibility artifact at the supraclinoid right ICA, at the site of known endovascular stent. Small amount of flow related enhancement at the medial aspect of the stent. 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 Anterior cerebral arteries: Normal. Congenital variant diminutive right A1 segment. Middle cerebral arteries: Normal. Posterior communicating arteries: Present on the right. Posterior cerebral arteries: Fetal origin of the right PCA. Normal left PCA. Basilar artery: Normal. Vertebral arteries: Left dominant. Normal. Superior cerebellar arteries: Normal. Anterior inferior cerebellar arteries: Normal. Posterior inferior cerebellar arteries: Normal. IMPRESSION: 1. Chronic ischemic microangiopathy and mildly age advanced atrophy without acute intracranial abnormality. 2. Minimal residual flow related enhancement at the medial aspect of the treated right ICA ophthalmic segment aneurysm, possibly indicating a small amount of residual patency of the aneurysm sac. 3. 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 Electronically Signed   By: Ulyses Jarred M.D.   On: 05/03/2017 00:04     Subjective: Patient seen and examined at bedside.  She feels much better and her  breathing has improved.  No overnight fever, nausea or  vomiting.  Discharge Exam: Vitals:   05/04/17 2114 05/05/17 0531  BP: (!) 166/85 (!) 153/85  Pulse: 85 79  Resp: (!) 22 19  Temp: 98.3 F (36.8 C) 98.3 F (36.8 C)  SpO2: 99% 96%   Vitals:   05/04/17 1509 05/04/17 2114 05/05/17 0500 05/05/17 0531  BP: (!) 170/76 (!) 166/85  (!) 153/85  Pulse: 91 85  79  Resp: 18 (!) 22  19  Temp: 97.8 F (36.6 C) 98.3 F (36.8 C)  98.3 F (36.8 C)  TempSrc: Oral Oral  Oral  SpO2: 96% 99%  96%  Weight:   93.2 kg (205 lb 7.5 oz)     General: Pt is alert, awake, not in acute distress Cardiovascular: Rate controlled, S1/S2 + Respiratory: Bilateral decreased breath sounds at bases  abdominal: Soft, NT, ND, bowel sounds + Extremities: no edema, no cyanosis    The results of significant diagnostics from this hospitalization (including imaging, microbiology, ancillary and laboratory) are listed below for reference.     Microbiology: Recent Results (from the past 240 hour(s))  Blood culture (routine x 2)     Status: None (Preliminary result)   Collection Time: 05/03/17  1:48 AM  Result Value Ref Range Status   Specimen Description BLOOD LEFT HAND  Final   Special Requests IN PEDIATRIC BOTTLE Blood Culture adequate volume  Final   Culture   Final    NO GROWTH 2 DAYS Performed at [facility], [address], [place], [place] [address]    Report Status PENDING  Incomplete  Blood culture (routine x 2)     Status: None (Preliminary result)   Collection Time: 05/03/17  2:15 AM  Result Value Ref Range Status   Specimen Description BLOOD LEFT ARM  Final   Special Requests IN PEDIATRIC BOTTLE Blood Culture adequate volume  Final   Culture   Final    NO GROWTH 2 DAYS Performed at [facility], [place] [address], [place], [place] [address]    Report Status PENDING  Incomplete     Labs: BNP (last 3 results) Recent Labs    05/02/17 2008  BNP 366.4*   Basic Metabolic Panel: Recent Labs  Lab 05/02/17 2008 05/04/17 0943  05/05/17 0525  NA 136 142 143  K 4.3 3.7 3.5  CL 103 108 108  CO2 21* 24 22  GLUCOSE 106* 139* 109*  BUN 10 9 12   CREATININE 0.94 0.63 0.63  CALCIUM 8.8* 9.3 9.3  MG  --  1.8 1.7   Liver Function Tests: Recent Labs  Lab 05/04/17 0943  AST 59*  ALT 25  ALKPHOS 67  BILITOT 0.9  PROT 6.4*  ALBUMIN 3.5   No results for input(s): LIPASE, AMYLASE in the last 168 hours. Recent Labs  Lab 05/02/17 2008  AMMONIA 36*   CBC: Recent Labs  Lab 05/02/17 2008 05/04/17 0943 05/05/17 0525  WBC 18.7* 9.3 8.0  NEUTROABS  --  7.0 5.5  HGB 12.8 11.2* 10.7*  HCT 41.0 34.8* 33.3*  MCV 92.1 90.4 89.8  PLT 233 243 235   Cardiac Enzymes: Recent Labs  Lab 05/03/17 0214 05/03/17 0856 05/03/17 1445  TROPONINI 0.09* 0.05* <0.03   BNP: Invalid input(s): POCBNP CBG: Recent Labs  Lab 05/02/17 1942  GLUCAP 117*   D-Dimer Recent Labs    05/02/17 2008  DDIMER 2.58*   Hgb A1c No results for input(s): HGBA1C in the last  72 hours. Lipid Profile No results for input(s): CHOL, HDL, LDLCALC, TRIG, CHOLHDL, LDLDIRECT in the last 72 hours. Thyroid function studies Recent Labs    05/02/17 2008  TSH 0.030*   Anemia work up No results for input(s): VITAMINB12, FOLATE, FERRITIN, TIBC, IRON, RETICCTPCT in the last 72 hours. Urinalysis    Component Value Date/Time   COLORURINE YELLOW 05/02/2017 2252   APPEARANCEUR HAZY (A) 05/02/2017 2252   LABSPEC 1.005 05/02/2017 2252   PHURINE 6.0 05/02/2017 2252   GLUCOSEU NEGATIVE 05/02/2017 2252   HGBUR SMALL (A) 05/02/2017 2252   BILIRUBINUR NEGATIVE 05/02/2017 2252   KETONESUR NEGATIVE 05/02/2017 2252   PROTEINUR NEGATIVE 05/02/2017 2252   UROBILINOGEN 0.2 10/20/2014 0814   NITRITE NEGATIVE 05/02/2017 2252   LEUKOCYTESUR NEGATIVE 05/02/2017 2252   Sepsis Labs Invalid input(s): PROCALCITONIN,  WBC,  LACTICIDVEN Microbiology Recent Results (from the past 240 hour(s))  Blood culture (routine x 2)     Status: None (Preliminary result)    Collection Time: 05/03/17  1:48 AM  Result Value Ref Range Status   Specimen Description BLOOD LEFT HAND  Final   Special Requests IN PEDIATRIC BOTTLE Blood Culture adequate volume  Final   Culture   Final    NO GROWTH 2 DAYS Performed at [facility], [place] [address] [place], [place] [address]    Report Status PENDING  Incomplete  Blood culture (routine x 2)     Status: None (Preliminary result)   Collection Time: 05/03/17  2:15 AM  Result Value Ref Range Status   Specimen Description BLOOD LEFT ARM  Final   Special Requests IN PEDIATRIC BOTTLE Blood Culture adequate volume  Final   Culture   Final    NO GROWTH 2 DAYS Performed at [facility], [place] [address], [place], [place] [address]    Report Status PENDING  Incomplete     Time coordinating discharge: 35 minutes  SIGNED:   Aline August, MD  [facility] 05/05/2017, 8:51 AM Pager: [phone]  If 7PM-7AM, please contact night-coverage [email or web address] Password [email or web address]

## 2017-05-05 NOTE — Progress Notes (Signed)
Pt escorted to exit via wheelchair and D/C'd home with daughter in private auto.

## 2017-05-06 ENCOUNTER — Telehealth: Payer: Self-pay | Admitting: *Deleted

## 2017-05-06 ENCOUNTER — Other Ambulatory Visit: Payer: Self-pay | Admitting: Family

## 2017-05-06 DIAGNOSIS — M48061 Spinal stenosis, lumbar region without neurogenic claudication: Secondary | ICD-10-CM | POA: Diagnosis not present

## 2017-05-06 DIAGNOSIS — I2510 Atherosclerotic heart disease of native coronary artery without angina pectoris: Secondary | ICD-10-CM | POA: Diagnosis not present

## 2017-05-06 DIAGNOSIS — M6281 Muscle weakness (generalized): Secondary | ICD-10-CM | POA: Diagnosis not present

## 2017-05-06 DIAGNOSIS — I110 Hypertensive heart disease with heart failure: Secondary | ICD-10-CM | POA: Diagnosis not present

## 2017-05-06 DIAGNOSIS — I480 Paroxysmal atrial fibrillation: Secondary | ICD-10-CM | POA: Diagnosis not present

## 2017-05-06 DIAGNOSIS — J439 Emphysema, unspecified: Secondary | ICD-10-CM | POA: Diagnosis not present

## 2017-05-06 DIAGNOSIS — E785 Hyperlipidemia, unspecified: Secondary | ICD-10-CM | POA: Diagnosis not present

## 2017-05-06 DIAGNOSIS — E039 Hypothyroidism, unspecified: Secondary | ICD-10-CM | POA: Diagnosis not present

## 2017-05-06 DIAGNOSIS — E119 Type 2 diabetes mellitus without complications: Secondary | ICD-10-CM | POA: Diagnosis not present

## 2017-05-06 DIAGNOSIS — G894 Chronic pain syndrome: Secondary | ICD-10-CM | POA: Diagnosis not present

## 2017-05-06 DIAGNOSIS — H548 Legal blindness, as defined in USA: Secondary | ICD-10-CM | POA: Diagnosis not present

## 2017-05-06 DIAGNOSIS — I5032 Chronic diastolic (congestive) heart failure: Secondary | ICD-10-CM | POA: Diagnosis not present

## 2017-05-06 DIAGNOSIS — I69354 Hemiplegia and hemiparesis following cerebral infarction affecting left non-dominant side: Secondary | ICD-10-CM | POA: Diagnosis not present

## 2017-05-06 DIAGNOSIS — G4733 Obstructive sleep apnea (adult) (pediatric): Secondary | ICD-10-CM | POA: Diagnosis not present

## 2017-05-06 NOTE — Telephone Encounter (Signed)
Transition Care Management Follow-up Telephone Call   Date discharged? 05/05/17   How have you been since you were released from the hospital? Pt states she is doing ok   Do you understand why you were in the hospital? YES   Do you understand the discharge instructions? YES   Where were you discharged to? Home   Items Reviewed:  Medications reviewed: YES, pt states she completed last antibiotic today   Allergies reviewed: YES  Dietary changes reviewed: YES  Referrals reviewed: She states she have not schedule neurology appt yet   Functional Questionnaire:   Activities of Daily Living (ADLs):   She states she are independent in the following: bathing and hygiene, feeding, continence, grooming, toileting and dressing States she require assistance with the following: ambulation   Any transportation issues/concerns?: NO   Any patient concerns? NO   Confirmed importance and date/time of follow-up visits scheduled YES, since [facility] doesn't have any openings schedule 05/13/17 w/ Mickel Baas since she has seen pt already   Provider Appointment booked with Jodi Mourning, NP  Confirmed with patient if condition begins to worsen call PCP or go to the ER.  Patient was given the office number and encouraged to call back with question or concerns.  : YES

## 2017-05-08 LAB — CULTURE, BLOOD (ROUTINE X 2)
CULTURE: NO GROWTH
CULTURE: NO GROWTH
Special Requests: ADEQUATE
Special Requests: ADEQUATE

## 2017-05-11 DIAGNOSIS — E785 Hyperlipidemia, unspecified: Secondary | ICD-10-CM | POA: Diagnosis not present

## 2017-05-11 DIAGNOSIS — G4733 Obstructive sleep apnea (adult) (pediatric): Secondary | ICD-10-CM | POA: Diagnosis not present

## 2017-05-11 DIAGNOSIS — I110 Hypertensive heart disease with heart failure: Secondary | ICD-10-CM | POA: Diagnosis not present

## 2017-05-11 DIAGNOSIS — I2510 Atherosclerotic heart disease of native coronary artery without angina pectoris: Secondary | ICD-10-CM | POA: Diagnosis not present

## 2017-05-11 DIAGNOSIS — I69354 Hemiplegia and hemiparesis following cerebral infarction affecting left non-dominant side: Secondary | ICD-10-CM | POA: Diagnosis not present

## 2017-05-11 DIAGNOSIS — H548 Legal blindness, as defined in USA: Secondary | ICD-10-CM | POA: Diagnosis not present

## 2017-05-11 DIAGNOSIS — I480 Paroxysmal atrial fibrillation: Secondary | ICD-10-CM | POA: Diagnosis not present

## 2017-05-11 DIAGNOSIS — I5032 Chronic diastolic (congestive) heart failure: Secondary | ICD-10-CM | POA: Diagnosis not present

## 2017-05-11 DIAGNOSIS — G894 Chronic pain syndrome: Secondary | ICD-10-CM | POA: Diagnosis not present

## 2017-05-11 DIAGNOSIS — M48061 Spinal stenosis, lumbar region without neurogenic claudication: Secondary | ICD-10-CM | POA: Diagnosis not present

## 2017-05-11 DIAGNOSIS — E039 Hypothyroidism, unspecified: Secondary | ICD-10-CM | POA: Diagnosis not present

## 2017-05-11 DIAGNOSIS — M6281 Muscle weakness (generalized): Secondary | ICD-10-CM | POA: Diagnosis not present

## 2017-05-11 DIAGNOSIS — J439 Emphysema, unspecified: Secondary | ICD-10-CM | POA: Diagnosis not present

## 2017-05-11 DIAGNOSIS — E119 Type 2 diabetes mellitus without complications: Secondary | ICD-10-CM | POA: Diagnosis not present

## 2017-05-13 ENCOUNTER — Ambulatory Visit (INDEPENDENT_AMBULATORY_CARE_PROVIDER_SITE_OTHER)
Admission: RE | Admit: 2017-05-13 | Discharge: 2017-05-13 | Disposition: A | Discharge status: Home / Self Care | Payer: Medicare Other | Source: Ambulatory Visit | Attending: Family | Admitting: Family

## 2017-05-13 ENCOUNTER — Ambulatory Visit (INDEPENDENT_AMBULATORY_CARE_PROVIDER_SITE_OTHER): Payer: Medicare Other | Admitting: Family

## 2017-05-13 ENCOUNTER — Other Ambulatory Visit (INDEPENDENT_AMBULATORY_CARE_PROVIDER_SITE_OTHER): Payer: Self-pay

## 2017-05-13 ENCOUNTER — Encounter: Payer: Self-pay | Admitting: Family

## 2017-05-13 VITALS — BP 122/82 | HR 98 | Temp 98.1°F | Ht 63.0 in | Wt 200.1 lb

## 2017-05-13 DIAGNOSIS — F5104 Psychophysiologic insomnia: Secondary | ICD-10-CM

## 2017-05-13 DIAGNOSIS — J189 Pneumonia, unspecified organism: Secondary | ICD-10-CM

## 2017-05-13 DIAGNOSIS — E039 Hypothyroidism, unspecified: Secondary | ICD-10-CM

## 2017-05-13 LAB — COMPREHENSIVE METABOLIC PANEL
ALBUMIN: 4.2 g/dL (ref 3.5–5.2)
ALT: 20 U/L (ref 0–35)
AST: 19 U/L (ref 0–37)
Alkaline Phosphatase: 70 U/L (ref 39–117)
BUN: 12 mg/dL (ref 6–23)
CALCIUM: 9.7 mg/dL (ref 8.4–10.5)
CO2: 25 mEq/L (ref 19–32)
Chloride: 105 mEq/L (ref 96–112)
Creatinine, Ser: 0.65 mg/dL (ref 0.40–1.20)
GFR: 96.29 mL/min (ref >60.00)
Glucose, Bld: 115 mg/dL — ABNORMAL HIGH (ref 70–99)
POTASSIUM: 4.3 meq/L (ref 3.5–5.1)
Sodium: 140 mEq/L (ref 135–145)
Total Bilirubin: 0.4 mg/dL (ref 0.2–1.2)
Total Protein: 7.3 g/dL (ref 6.0–8.3)

## 2017-05-13 LAB — CBC WITH DIFFERENTIAL/PLATELET
BASOS PCT: 0.8 % (ref 0.0–3.0)
Basophils Absolute: 0.1 10*3/uL (ref 0.0–0.1)
EOS PCT: 0.5 % (ref 0.0–5.0)
Eosinophils Absolute: 0.1 10*3/uL (ref 0.0–0.7)
HEMATOCRIT: 38.8 % (ref 36.0–46.0)
HEMOGLOBIN: 12.7 g/dL (ref 12.0–15.0)
LYMPHS PCT: 24.9 % (ref 12.0–46.0)
Lymphs Abs: 2.6 10*3/uL (ref 0.7–4.0)
MCHC: 32.8 g/dL (ref 30.0–36.0)
MCV: 87.1 fl (ref 78.0–100.0)
Monocytes Absolute: 0.6 10*3/uL (ref 0.1–1.0)
Monocytes Relative: 5.3 % (ref 3.0–12.0)
Neutro Abs: 7.3 10*3/uL (ref 1.4–7.7)
Neutrophils Relative %: 68.5 % (ref 43.0–77.0)
Platelets: 339 10*3/uL (ref 150.0–400.0)
RBC: 4.46 Mil/uL (ref 3.87–5.11)
RDW: 14.4 % (ref 11.5–15.5)
WBC: 10.6 10*3/uL — AB (ref 4.0–10.5)

## 2017-05-13 LAB — TSH: TSH: 0.1 u[IU]/mL — ABNORMAL LOW (ref 0.35–4.50)

## 2017-05-13 MED ORDER — ZOLPIDEM TARTRATE 5 MG PO TABS: 5.0000 mg | ORAL_TABLET | Freq: Every evening | ORAL | 1 refills | Status: DC | PRN

## 2017-05-13 NOTE — Progress Notes (Signed)
Kelsey Hanna is a 67 y.o. female with the following history as recorded in EpicCare:  Patient Active Problem List   Diagnosis Date Noted  . Sepsis ([place]) 05/03/2017  . MRSA bacteremia   . Brain aneurysm 03/13/2017  . Internal carotid aneurysm   . Slurred speech 03/12/2017  . Hypotension 03/12/2017  . Dysarthria   . Left sided numbness   . Acute left-sided muscle weakness 01/24/2017  . 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 Intracranial aneurysm 01/24/2017  . Encounter for medication review 02/07/2016  . Hypoxia, sleep related 09/07/2015  . Constipation 07/30/2015  . Dizziness 07/30/2015  . Chronic diastolic CHF (congestive heart failure) ([place]) 06/26/2015  . Bilateral lower extremity edema 06/26/2015  . Mitral regurgitation 05/17/2015  . Accidental drug overdose 05/17/2015  . Respiratory failure with hypoxia and hypercapnia ([place]) 05/09/2015  . OSA (obstructive sleep apnea) 05/09/2015  . Overdose 05/09/2015  . Chronic back pain 03/20/2015  . Right shoulder pain 11/14/2014  . Insomnia 11/14/2014  . Psychoses ([place])   . Bacteremia   . Pedal edema 09/12/2014  . Bipolar disorder, current episode manic without psychotic features, severe ([place])   . Bipolar disorder ([place]) 09/05/2014  . Bipolar affective disorder, current episode manic with psychotic symptoms ([place]) 09/05/2014  . Bipolar affective disorder, manic ([place]) 09/03/2014  . Encounter for preadmission testing   . Head revolving around 07/27/2014  . BP (high blood pressure) 05/23/2014  . AF (paroxysmal atrial fibrillation) ([place]) 05/23/2014  . Type 2 diabetes mellitus ([place]) 04/27/2014  . Essential hypertension 04/27/2014  . Hypothyroidism 04/27/2014  . Tobacco abuse 04/27/2014  . Bipolar I disorder ([place]) 04/20/2014  . Acute encephalopathy 04/18/2014  . Acute respiratory failure with hypoxia ([place])   . Encounter for feeding tube placement   . Shortness of breath   . Gout 08/25/2013  . 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  HLD (hyperlipidemia) 08/30/2009    Current Outpatient Medications  Medication Sig Dispense Refill  . aspirin 325 MG tablet Take 1 tablet (325 mg total) by mouth daily. 30 tablet 3  . atorvastatin (LIPITOR) 40 MG tablet Take 1 tablet (40 mg total) by mouth every evening. 90 tablet 1  . clonazePAM (KLONOPIN) 0.5 MG tablet Take 1 tablet (0.5 mg total) by mouth 2 (two) times daily. 1 in the morning and 1 midday 20 tablet 0  . clopidogrel (PLAVIX) 75 MG tablet Take 75 mg by mouth daily.    Marland Kitchen docusate sodium (DOCQLACE) 100 MG capsule TAKE 1 TO 2 CAPSULES BY MOUTH TWICE A DAY AS NEEDED FOR MILD CONSTIPATION 60 capsule 2  . gabapentin (NEURONTIN) 100 MG capsule Take 100 mg by mouth 3 (three) times daily.    Marland Kitchen levothyroxine (SYNTHROID, LEVOTHROID) 150 MCG tablet TAKE 1 TABLET BY MOUTH DAILY BEFORE BREAKFAST 90 tablet 0  . linaclotide (LINZESS) 145 MCG CAPS capsule Take 1 capsule (145 mcg total) by mouth daily before breakfast. 90 capsule 1  . loratadine (CLARITIN) 10 MG tablet Take 1 tablet (10 mg total) by mouth daily. 30 tablet 3  . OLANZapine (ZYPREXA) 20 MG tablet Take 20 mg by mouth at bedtime.     . Oxycodone HCl 10 MG TABS Take 1 tablet (10 mg total) by mouth every 6 (six) hours as needed (pain). (Patient taking differently: Take 10 mg by mouth 5 (five) times daily as needed (pain). ) 15 tablet 0  . zolpidem (AMBIEN) 5 MG tablet Take 1 tablet (5 mg total) by mouth at bedtime as needed for  sleep. 30 tablet 1   No current facility-administered medications for this visit.     Allergies: Patient has no known allergies.  Past Medical History:  Diagnosis Date  . Anxiety   . Bipolar 1 disorder ([place])   . Cancer [facility])    cancer of uterus...no chemo or radiation  . Chicken pox   . Depression   . Diabetes mellitus    dx within last yr....just takes pills  . Dyslipidemia   . Hypothyroid   . Retinal detachment   . Sleep apnea     Past Surgical History:  Procedure Laterality Date  . ABDOMINAL  HYSTERECTOMY    . IR 3D INDEPENDENT WKST  03/13/2017  . IR ANGIO INTRA EXTRACRAN SEL COM CAROTID INNOMINATE UNI L MOD SED  03/13/2017  . IR ANGIO INTRA EXTRACRAN SEL INTERNAL CAROTID UNI R MOD SED  03/13/2017  . IR ANGIO VERTEBRAL SEL SUBCLAVIAN INNOMINATE UNI R MOD SED  03/13/2017  . IR ANGIOGRAM FOLLOW UP STUDY  03/13/2017  . IR CT HEAD LTD  03/13/2017  . IR RADIOLOGIST EVAL & MGMT  02/19/2017  . IR TRANSCATH/EMBOLIZ  03/13/2017  . LUMBAR LAMINECTOMY/DECOMPRESSION MICRODISCECTOMY  07/17/2011   Procedure: LUMBAR LAMINECTOMY/DECOMPRESSION MICRODISCECTOMY;  Surgeon: Sinclair Ship, MD;  Location: [facility];  Service: Orthopedics;  Laterality: Left;  Left sided lumbar 4-5 microdisectomy  . RADIOLOGY WITH ANESTHESIA N/A 03/13/2017   Procedure: EMBOLIZATION;  Surgeon: Luanne Bras, MD;  Location: [facility];  Service: Radiology;  Laterality: N/A;  . TEE WITHOUT CARDIOVERSION N/A 03/17/2017   Procedure: TRANSESOPHAGEAL ECHOCARDIOGRAM (TEE);  Surgeon: Dorothy Spark, MD;  [facility] ENDOSCOPY;  Service: Cardiovascular;  Laterality: N/A;  . TONSILLECTOMY    . TOTAL ABDOMINAL HYSTERECTOMY W/ BILATERAL SALPINGOOPHORECTOMY      Family History  Problem Relation Age of Onset  . Healthy Mother   . Healthy Father     Social History   Tobacco Use  . Smoking status: Current Every Day Smoker    Packs/day: 0.25    Years: 35.00    Pack years: 8.75    Types: Cigarettes    Last attempt to quit: 07/09/1999    Years since quitting: 17.8  . Smokeless tobacco: Never Used  Substance Use Topics  . Alcohol use: No    Alcohol/week: 0.0 oz    Comment: socially    Subjective:  Patient presents for a follow-up for her recent hospitalization for LLL pneumonia; she was taken by ambulance on 05/02/2017 with slurred speech; while in the ER, was found to be very hypoxic as well; imaging reveavled LLL pneumonia; brain MRI did not show any acute changes; was treated with IV antibiotics and discharged on oral  antibiotics; has finished her oral antibiotics in the past few days; notes she is feeling "very good" today and has no concerns for cough/ congestion/ shortness of breath or recurrent fever; Per ER discharge, CBC and CMP need to be repeated; She is also requesting refills on her Ambien and Synthroid today;  Patient has recently had a nurse coordinator with her from [facility] at her visits; today she is alone but notes she continues to have regular help from CNA in her home; she asks for information regarding her pain management provider- she notes her phone book was lost with recent stay in hospital and she needs numbers; she states that she has seen her psychiatrist since leaving the hospital- they apparently come to her home;   Objective:  Vitals:   05/13/17 1504  BP: 122/82  Pulse: 98  Temp: 98.1 F (36.7 C)  TempSrc: Oral  SpO2: 95%  Weight: 200 lb 1.3 oz (90.8 kg)  Height: _0  (1.6 m)    General: Well developed, well nourished, in no acute distress  Skin : Warm and dry.  Head: Normocephalic and atraumatic  Lungs: Respirations unlabored; clear to auscultation bilaterally without wheeze, rales, rhonchi  CVS exam: normal rate and regular rhythm.  Neurologic: Alert and oriented; speech intact; face symmetrical; moves all extremities well; CNII-XII intact without focal deficit  Assessment:  1. Pneumonia due to infectious organism, unspecified laterality, unspecified part of lung   2. Hypothyroidism, unspecified type   3. Chronic insomnia     Plan:  1. Patient appears clinically stable; breathing is normal; update labs and CXR today; 2. Re-check TSh today; will adjust Synthroid accordingly; 3. Refill given on Ambien ( short-term refill);  There are concerning memory changes present with this patient; she notes she already has CNA coming to her home regularly and [facility] is involved; I have asked her to have her nurse coordinator come to appointments with her from now on;  stressed need to keep upcoming neurology appointment; may also need to consider repeat evaluation from Home health if she did not keep the one scheduled at time of discharged; close follow-up for this patient.   No follow-ups on file.  Orders Placed This Encounter  Procedures  . DG Chest 2 View    Standing Status:   Future    Number of Occurrences:   1    Standing Expiration Date:   07/14/2018    Order Specific Question:   Reason for Exam (SYMPTOM  OR DIAGNOSIS REQUIRED)    Answer:   follow-up on pneumonia    Order Specific Question:   Preferred imaging location?    Answer:   [facility]    Order Specific Question:   Radiology Contrast Protocol - do NOT remove file path    Answer:   [email or web address]  . CBC w/Diff    Standing Status:   Future    Number of Occurrences:   1    Standing Expiration Date:   05/13/2018  . Comp Met (CMET)    Standing Status:   Future    Number of Occurrences:   1    Standing Expiration Date:   05/13/2018  . TSH    Standing Status:   Future    Number of Occurrences:   1    Standing Expiration Date:   05/13/2018    Requested Prescriptions   Signed Prescriptions Disp Refills  . zolpidem (AMBIEN) 5 MG tablet 30 tablet 1    Sig: Take 1 tablet (5 mg total) by mouth at bedtime as needed for sleep.

## 2017-05-13 NOTE — Patient Instructions (Signed)
Your pain management specialist is [facility]; # [phone];

## 2017-05-14 DIAGNOSIS — I69354 Hemiplegia and hemiparesis following cerebral infarction affecting left non-dominant side: Secondary | ICD-10-CM | POA: Diagnosis not present

## 2017-05-14 DIAGNOSIS — M48061 Spinal stenosis, lumbar region without neurogenic claudication: Secondary | ICD-10-CM | POA: Diagnosis not present

## 2017-05-14 DIAGNOSIS — I2510 Atherosclerotic heart disease of native coronary artery without angina pectoris: Secondary | ICD-10-CM | POA: Diagnosis not present

## 2017-05-14 DIAGNOSIS — I110 Hypertensive heart disease with heart failure: Secondary | ICD-10-CM | POA: Diagnosis not present

## 2017-05-14 DIAGNOSIS — M6281 Muscle weakness (generalized): Secondary | ICD-10-CM | POA: Diagnosis not present

## 2017-05-14 DIAGNOSIS — I5032 Chronic diastolic (congestive) heart failure: Secondary | ICD-10-CM | POA: Diagnosis not present

## 2017-05-14 DIAGNOSIS — J439 Emphysema, unspecified: Secondary | ICD-10-CM | POA: Diagnosis not present

## 2017-05-14 DIAGNOSIS — E119 Type 2 diabetes mellitus without complications: Secondary | ICD-10-CM | POA: Diagnosis not present

## 2017-05-14 DIAGNOSIS — H548 Legal blindness, as defined in USA: Secondary | ICD-10-CM | POA: Diagnosis not present

## 2017-05-14 DIAGNOSIS — G4733 Obstructive sleep apnea (adult) (pediatric): Secondary | ICD-10-CM | POA: Diagnosis not present

## 2017-05-14 DIAGNOSIS — E785 Hyperlipidemia, unspecified: Secondary | ICD-10-CM | POA: Diagnosis not present

## 2017-05-14 DIAGNOSIS — E039 Hypothyroidism, unspecified: Secondary | ICD-10-CM | POA: Diagnosis not present

## 2017-05-14 DIAGNOSIS — G894 Chronic pain syndrome: Secondary | ICD-10-CM | POA: Diagnosis not present

## 2017-05-14 DIAGNOSIS — I480 Paroxysmal atrial fibrillation: Secondary | ICD-10-CM | POA: Diagnosis not present

## 2017-05-15 ENCOUNTER — Other Ambulatory Visit: Payer: Self-pay | Admitting: Family

## 2017-05-15 DIAGNOSIS — M48061 Spinal stenosis, lumbar region without neurogenic claudication: Secondary | ICD-10-CM | POA: Diagnosis not present

## 2017-05-15 DIAGNOSIS — J439 Emphysema, unspecified: Secondary | ICD-10-CM | POA: Diagnosis not present

## 2017-05-15 DIAGNOSIS — G4733 Obstructive sleep apnea (adult) (pediatric): Secondary | ICD-10-CM | POA: Diagnosis not present

## 2017-05-15 DIAGNOSIS — E039 Hypothyroidism, unspecified: Secondary | ICD-10-CM | POA: Diagnosis not present

## 2017-05-15 DIAGNOSIS — E119 Type 2 diabetes mellitus without complications: Secondary | ICD-10-CM | POA: Diagnosis not present

## 2017-05-15 DIAGNOSIS — G894 Chronic pain syndrome: Secondary | ICD-10-CM | POA: Diagnosis not present

## 2017-05-15 DIAGNOSIS — I2510 Atherosclerotic heart disease of native coronary artery without angina pectoris: Secondary | ICD-10-CM | POA: Diagnosis not present

## 2017-05-15 DIAGNOSIS — E785 Hyperlipidemia, unspecified: Secondary | ICD-10-CM | POA: Diagnosis not present

## 2017-05-15 DIAGNOSIS — H548 Legal blindness, as defined in USA: Secondary | ICD-10-CM | POA: Diagnosis not present

## 2017-05-15 DIAGNOSIS — I69354 Hemiplegia and hemiparesis following cerebral infarction affecting left non-dominant side: Secondary | ICD-10-CM | POA: Diagnosis not present

## 2017-05-15 DIAGNOSIS — M6281 Muscle weakness (generalized): Secondary | ICD-10-CM | POA: Diagnosis not present

## 2017-05-15 DIAGNOSIS — I480 Paroxysmal atrial fibrillation: Secondary | ICD-10-CM | POA: Diagnosis not present

## 2017-05-15 DIAGNOSIS — I5032 Chronic diastolic (congestive) heart failure: Secondary | ICD-10-CM | POA: Diagnosis not present

## 2017-05-15 DIAGNOSIS — I110 Hypertensive heart disease with heart failure: Secondary | ICD-10-CM | POA: Diagnosis not present

## 2017-05-15 MED ORDER — LEVOTHYROXINE SODIUM 150 MCG PO TABS: ORAL_TABLET | ORAL | 0 refills | Status: DC

## 2017-05-15 NOTE — Progress Notes (Signed)
I called today and left message for [name], RN with [facility] @ [phone] (cell) regarding patient. In terms of the Synthroid can you send in script to [facility] instructing  them to give her the Synthroid 6 days a week so she can start taking it? Did you want me to try and follow up with [name] on Monday?

## 2017-05-18 ENCOUNTER — Ambulatory Visit: Payer: Self-pay | Admitting: Adult Health

## 2017-05-19 ENCOUNTER — Telehealth: Payer: Self-pay

## 2017-05-19 ENCOUNTER — Ambulatory Visit: Payer: Medicare Other | Admitting: Neurology

## 2017-05-19 DIAGNOSIS — E039 Hypothyroidism, unspecified: Secondary | ICD-10-CM | POA: Diagnosis not present

## 2017-05-19 DIAGNOSIS — E119 Type 2 diabetes mellitus without complications: Secondary | ICD-10-CM | POA: Diagnosis not present

## 2017-05-19 DIAGNOSIS — I5032 Chronic diastolic (congestive) heart failure: Secondary | ICD-10-CM | POA: Diagnosis not present

## 2017-05-19 DIAGNOSIS — G4733 Obstructive sleep apnea (adult) (pediatric): Secondary | ICD-10-CM | POA: Diagnosis not present

## 2017-05-19 DIAGNOSIS — I480 Paroxysmal atrial fibrillation: Secondary | ICD-10-CM | POA: Diagnosis not present

## 2017-05-19 DIAGNOSIS — I2510 Atherosclerotic heart disease of native coronary artery without angina pectoris: Secondary | ICD-10-CM | POA: Diagnosis not present

## 2017-05-19 DIAGNOSIS — M6281 Muscle weakness (generalized): Secondary | ICD-10-CM | POA: Diagnosis not present

## 2017-05-19 DIAGNOSIS — M48061 Spinal stenosis, lumbar region without neurogenic claudication: Secondary | ICD-10-CM | POA: Diagnosis not present

## 2017-05-19 DIAGNOSIS — H548 Legal blindness, as defined in USA: Secondary | ICD-10-CM | POA: Diagnosis not present

## 2017-05-19 DIAGNOSIS — G894 Chronic pain syndrome: Secondary | ICD-10-CM | POA: Diagnosis not present

## 2017-05-19 DIAGNOSIS — I69354 Hemiplegia and hemiparesis following cerebral infarction affecting left non-dominant side: Secondary | ICD-10-CM | POA: Diagnosis not present

## 2017-05-19 DIAGNOSIS — I110 Hypertensive heart disease with heart failure: Secondary | ICD-10-CM | POA: Diagnosis not present

## 2017-05-19 DIAGNOSIS — J439 Emphysema, unspecified: Secondary | ICD-10-CM | POA: Diagnosis not present

## 2017-05-19 DIAGNOSIS — E785 Hyperlipidemia, unspecified: Secondary | ICD-10-CM | POA: Diagnosis not present

## 2017-05-19 NOTE — Telephone Encounter (Signed)
(  Routing to [place] as an [place])  Spoke with [place] today. Patient was suppose to have an apt today with neuro. Once they showed up they had been told apt had been cancelled due to "no transportation".  [name] seems to think once again patient's memory played role in apt being cancelled today as they probably called her to remind her of apt and she told them she didn't have transportation and had no recollection of appointment. Now appointment has been made to the end of May. [name] was wondering if there was any way we could call or get appointment moved up? I told her I would run it by you but that hospital had originally made referral as part of hospital follow up so not sure how much you would be able to do but would ask. [name] has now asked that she be contacted regarding all of patient's appointments as she is her [place] provider.

## 2017-05-19 NOTE — Progress Notes (Signed)
(  FYI)  Finally spoke with [name] from [facility] regarding patient. As of now she currently receives home health 2 hours a day 5 days a week. She was under the assumption that interm of patient's care that [facility] was taking over to direct her care. However she hasn't made it to see [facility] yet due to her schedule. I did mention that you were concerned about her well being/safety.  As of right now Medicaid pays for the CNA to come out and this is determined by the nurse assessment that takes place once a year. [name] said they had recommended previously that patient go to assisted living but patient has declined and doesn't want to go. They have also mentioned this to patient's daughter also who didn't think her mother needed to go. Per [name] patient has apt today to see neuro from her hospital follow up and she is hoping to address this again with this visit for assisted living in hopes that she can obtain enough info to perhaps inform daughter that she needs to go to assisted living. Please let me know if you need me to assist with anything else.  Thanks

## 2017-05-21 DIAGNOSIS — I110 Hypertensive heart disease with heart failure: Secondary | ICD-10-CM | POA: Diagnosis not present

## 2017-05-21 DIAGNOSIS — E039 Hypothyroidism, unspecified: Secondary | ICD-10-CM | POA: Diagnosis not present

## 2017-05-21 DIAGNOSIS — J439 Emphysema, unspecified: Secondary | ICD-10-CM | POA: Diagnosis not present

## 2017-05-21 DIAGNOSIS — I5032 Chronic diastolic (congestive) heart failure: Secondary | ICD-10-CM | POA: Diagnosis not present

## 2017-05-21 DIAGNOSIS — M6281 Muscle weakness (generalized): Secondary | ICD-10-CM | POA: Diagnosis not present

## 2017-05-21 DIAGNOSIS — G894 Chronic pain syndrome: Secondary | ICD-10-CM | POA: Diagnosis not present

## 2017-05-21 DIAGNOSIS — M48061 Spinal stenosis, lumbar region without neurogenic claudication: Secondary | ICD-10-CM | POA: Diagnosis not present

## 2017-05-21 DIAGNOSIS — G4733 Obstructive sleep apnea (adult) (pediatric): Secondary | ICD-10-CM | POA: Diagnosis not present

## 2017-05-21 DIAGNOSIS — I480 Paroxysmal atrial fibrillation: Secondary | ICD-10-CM | POA: Diagnosis not present

## 2017-05-21 DIAGNOSIS — H548 Legal blindness, as defined in USA: Secondary | ICD-10-CM | POA: Diagnosis not present

## 2017-05-21 DIAGNOSIS — I69354 Hemiplegia and hemiparesis following cerebral infarction affecting left non-dominant side: Secondary | ICD-10-CM | POA: Diagnosis not present

## 2017-05-21 DIAGNOSIS — I2510 Atherosclerotic heart disease of native coronary artery without angina pectoris: Secondary | ICD-10-CM | POA: Diagnosis not present

## 2017-05-21 DIAGNOSIS — E785 Hyperlipidemia, unspecified: Secondary | ICD-10-CM | POA: Diagnosis not present

## 2017-05-21 DIAGNOSIS — E119 Type 2 diabetes mellitus without complications: Secondary | ICD-10-CM | POA: Diagnosis not present

## 2017-05-26 ENCOUNTER — Telehealth: Payer: Self-pay | Admitting: Nurse Practitioner

## 2017-05-26 NOTE — Telephone Encounter (Signed)
Copied from [place]. Topic: Quick Communication - See Telephone Encounter >> May 26, 2017  4:01 PM Rutherford Nail, NT wrote: CRM for notification. See Telephone encounter for: 05/26/17. Patient called to see if [facility] could prescribe her depakote for her bipolar disorder. [place], [place] [phone] (Phone) [phone] (Fax)

## 2017-05-27 ENCOUNTER — Ambulatory Visit: Payer: Self-pay | Admitting: Family

## 2017-05-27 NOTE — Telephone Encounter (Signed)
(  FYI)   I called and left message for Kelsey Hanna with [name] informing her that patient had called in yesterday requesting [facility] to prescribe depakote. I called and left message for her to return call to clinic and informed her that we would not be taking over this medication and that patient should continue to get this med from her psychiatrist. Also made her aware that patient had made herself an appointment to be seen today at 10:00 am. CRM created incase she returns call to clinic.

## 2017-05-27 NOTE — Telephone Encounter (Signed)
I called and left message for Kelsey Hanna with [name] informing her that patient had called in yesterday requesting [facility]hel[facility] to prescribe depakote. I called and left message for her to return call to clinic and informed her that we would not be taking over this medication and that patient should continue to get this med from her psychiatrist. Also made her aware that patient had made herself an appointment to be seen today at 10:00 am. CRM created Kelsey Hanna calls back.

## 2017-05-27 NOTE — Telephone Encounter (Signed)
(  FYI)  She is currently still on our schedule for the appointment I made with [name] for 06/10/17 @ 1:00 pm.

## 2017-05-27 NOTE — Telephone Encounter (Signed)
Please let her case worker know that we will not be taking her over this prescription for her. She needs to stay with her psychiatrist.

## 2017-05-27 NOTE — Telephone Encounter (Signed)
Kelsey Bunker, RN called back and states that the pt will not be coming in for her appointment today. Pt did not even remember where she made an appointment with yesterday. Pt is no longer on depakote and will not need this medication. CB#: [phone]

## 2017-06-01 ENCOUNTER — Encounter (HOSPITAL_COMMUNITY): Payer: Self-pay | Admitting: Emergency Medicine

## 2017-06-01 ENCOUNTER — Other Ambulatory Visit (HOSPITAL_COMMUNITY): Payer: Self-pay | Admitting: Interventional Radiology

## 2017-06-01 DIAGNOSIS — Z5321 Procedure and treatment not carried out due to patient leaving prior to being seen by health care provider: Secondary | ICD-10-CM | POA: Insufficient documentation

## 2017-06-01 DIAGNOSIS — M5489 Other dorsalgia: Secondary | ICD-10-CM | POA: Diagnosis not present

## 2017-06-01 DIAGNOSIS — I729 Aneurysm of unspecified site: Secondary | ICD-10-CM

## 2017-06-01 DIAGNOSIS — M549 Dorsalgia, unspecified: Secondary | ICD-10-CM | POA: Insufficient documentation

## 2017-06-01 NOTE — ED Triage Notes (Signed)
CALLED PT FOR RECHECK OF V/S 1ST ATTEMPT NO RESPONSE

## 2017-06-01 NOTE — ED Triage Notes (Signed)
Patient here via EMS from home with complaints of lower back pain. Reports that she ran out of her oxycodone and would like a refill. Hx of same.

## 2017-06-02 ENCOUNTER — Emergency Department (HOSPITAL_COMMUNITY)
Admission: EM | Admit: 2017-06-02 | Discharge: 2017-06-02 | Disposition: A | Discharge status: Home / Self Care | Payer: Medicare Other | Attending: Emergency Medicine | Admitting: Emergency Medicine

## 2017-06-02 NOTE — ED Notes (Signed)
Pt called again for triage with no answer

## 2017-06-03 ENCOUNTER — Telehealth: Payer: Self-pay | Admitting: Nurse Practitioner

## 2017-06-03 ENCOUNTER — Other Ambulatory Visit: Payer: Self-pay

## 2017-06-03 DIAGNOSIS — F17210 Nicotine dependence, cigarettes, uncomplicated: Secondary | ICD-10-CM

## 2017-06-03 DIAGNOSIS — Z8673 Personal history of transient ischemic attack (TIA), and cerebral infarction without residual deficits: Secondary | ICD-10-CM

## 2017-06-03 DIAGNOSIS — I5032 Chronic diastolic (congestive) heart failure: Secondary | ICD-10-CM | POA: Diagnosis not present

## 2017-06-03 DIAGNOSIS — F319 Bipolar disorder, unspecified: Secondary | ICD-10-CM | POA: Diagnosis not present

## 2017-06-03 DIAGNOSIS — F419 Anxiety disorder, unspecified: Secondary | ICD-10-CM | POA: Diagnosis not present

## 2017-06-03 DIAGNOSIS — I2510 Atherosclerotic heart disease of native coronary artery without angina pectoris: Secondary | ICD-10-CM | POA: Diagnosis not present

## 2017-06-03 DIAGNOSIS — M6281 Muscle weakness (generalized): Secondary | ICD-10-CM | POA: Diagnosis not present

## 2017-06-03 DIAGNOSIS — J439 Emphysema, unspecified: Secondary | ICD-10-CM | POA: Diagnosis not present

## 2017-06-03 DIAGNOSIS — E119 Type 2 diabetes mellitus without complications: Secondary | ICD-10-CM | POA: Diagnosis not present

## 2017-06-03 DIAGNOSIS — E785 Hyperlipidemia, unspecified: Secondary | ICD-10-CM

## 2017-06-03 DIAGNOSIS — I110 Hypertensive heart disease with heart failure: Secondary | ICD-10-CM | POA: Diagnosis not present

## 2017-06-03 DIAGNOSIS — I480 Paroxysmal atrial fibrillation: Secondary | ICD-10-CM | POA: Diagnosis not present

## 2017-06-03 DIAGNOSIS — E039 Hypothyroidism, unspecified: Secondary | ICD-10-CM | POA: Diagnosis not present

## 2017-06-03 DIAGNOSIS — G4733 Obstructive sleep apnea (adult) (pediatric): Secondary | ICD-10-CM | POA: Diagnosis not present

## 2017-06-03 DIAGNOSIS — J189 Pneumonia, unspecified organism: Secondary | ICD-10-CM | POA: Diagnosis not present

## 2017-06-03 NOTE — Telephone Encounter (Signed)
Please advise.  She has an upcoming apt with you on 06/10/17 with nurse @ 1:00 pm.  Thanks

## 2017-06-03 NOTE — Telephone Encounter (Signed)
Copied from [place]. Topic: General - Other >> Jun 03, 2017 11:13 AM Yvette Rack wrote: Reason for CRM: Kelsey Hanna from [facility] calling to speak with Angela Nevin about pt Ambien 5mg  she states that she is not sleeping at night and want to know if it can be increased or changed to a new medication or do she need to bring pt into the office she also states that pt isn't on pain medicine any more

## 2017-06-04 DIAGNOSIS — E039 Hypothyroidism, unspecified: Secondary | ICD-10-CM | POA: Diagnosis not present

## 2017-06-04 DIAGNOSIS — J439 Emphysema, unspecified: Secondary | ICD-10-CM | POA: Diagnosis not present

## 2017-06-04 DIAGNOSIS — H548 Legal blindness, as defined in USA: Secondary | ICD-10-CM | POA: Diagnosis not present

## 2017-06-04 DIAGNOSIS — G894 Chronic pain syndrome: Secondary | ICD-10-CM | POA: Diagnosis not present

## 2017-06-04 DIAGNOSIS — M48061 Spinal stenosis, lumbar region without neurogenic claudication: Secondary | ICD-10-CM | POA: Diagnosis not present

## 2017-06-04 DIAGNOSIS — E119 Type 2 diabetes mellitus without complications: Secondary | ICD-10-CM | POA: Diagnosis not present

## 2017-06-04 DIAGNOSIS — I5032 Chronic diastolic (congestive) heart failure: Secondary | ICD-10-CM | POA: Diagnosis not present

## 2017-06-04 DIAGNOSIS — I69354 Hemiplegia and hemiparesis following cerebral infarction affecting left non-dominant side: Secondary | ICD-10-CM | POA: Diagnosis not present

## 2017-06-04 DIAGNOSIS — M6281 Muscle weakness (generalized): Secondary | ICD-10-CM | POA: Diagnosis not present

## 2017-06-04 DIAGNOSIS — I2510 Atherosclerotic heart disease of native coronary artery without angina pectoris: Secondary | ICD-10-CM | POA: Diagnosis not present

## 2017-06-04 DIAGNOSIS — I480 Paroxysmal atrial fibrillation: Secondary | ICD-10-CM | POA: Diagnosis not present

## 2017-06-04 DIAGNOSIS — G4733 Obstructive sleep apnea (adult) (pediatric): Secondary | ICD-10-CM | POA: Diagnosis not present

## 2017-06-04 DIAGNOSIS — E785 Hyperlipidemia, unspecified: Secondary | ICD-10-CM | POA: Diagnosis not present

## 2017-06-04 DIAGNOSIS — I110 Hypertensive heart disease with heart failure: Secondary | ICD-10-CM | POA: Diagnosis not present

## 2017-06-04 NOTE — Telephone Encounter (Signed)
Spoke with [name] today and info given and agreed to wait until visit on Wed to address.

## 2017-06-04 NOTE — Telephone Encounter (Signed)
Let's discuss at her OV please; not comfortable increasing sleep medications at this time based on memory issues we have recently seen.

## 2017-06-09 ENCOUNTER — Ambulatory Visit (HOSPITAL_COMMUNITY)
Admission: RE | Admit: 2017-06-09 | Discharge: 2017-06-09 | Disposition: A | Discharge status: Home / Self Care | Payer: Medicare Other | Source: Ambulatory Visit | Attending: Interventional Radiology | Admitting: Interventional Radiology

## 2017-06-09 DIAGNOSIS — I729 Aneurysm of unspecified site: Secondary | ICD-10-CM

## 2017-06-09 DIAGNOSIS — Z7982 Long term (current) use of aspirin: Secondary | ICD-10-CM | POA: Diagnosis not present

## 2017-06-09 DIAGNOSIS — Z79899 Other long term (current) drug therapy: Secondary | ICD-10-CM | POA: Diagnosis not present

## 2017-06-09 DIAGNOSIS — Z7902 Long term (current) use of antithrombotics/antiplatelets: Secondary | ICD-10-CM | POA: Diagnosis not present

## 2017-06-09 DIAGNOSIS — I671 Cerebral aneurysm, nonruptured: Secondary | ICD-10-CM | POA: Diagnosis not present

## 2017-06-09 HISTORY — PX: IR RADIOLOGIST EVAL & MGMT: IMG5224

## 2017-06-09 LAB — PLATELET INHIBITION P2Y12: Platelet Function  P2Y12: 5 [PRU] — ABNORMAL LOW (ref 194–418)

## 2017-06-09 NOTE — Consult Note (Signed)
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  Supervising Physician: Luanne Bras  Patient Status: [facility] - Out-pt  History of Present Illness: Kelsey Hanna is a 67 y.o. female  Hx diabetes mellitus, dyslipidemia, hypothyroidism, uterine cancer, and blindness of left eye. 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 Estanislado Pandy.  Patient presents today for follow-up regarding her right para ophthalmic aneurysm s/p embolization and for management of her left superior hypophyseal aneurysm. Complains of right visual field "shaking". States that this is associated with dizziness. Complains of easy bruising since procedure. Denies headache, weakness, numbness/tingling, hearing changes, tinnitus, or speech difficulty.  Patient is taking Plavix 75 mg once daily and Aspirin 325 mg once daily.  Past Medical History:  Diagnosis Date  . Anxiety   . Bipolar 1 disorder ([place])   . Cancer [facility])    cancer of uterus...no chemo or radiation  . Chicken pox   . Depression   . Diabetes mellitus    dx within last yr....just takes pills  . Dyslipidemia   . Hypothyroid   . Retinal detachment   . Sleep apnea     Past Surgical History:  Procedure Laterality Date  . ABDOMINAL HYSTERECTOMY    . IR 3D INDEPENDENT WKST  03/13/2017  . IR ANGIO INTRA EXTRACRAN SEL COM CAROTID INNOMINATE UNI L MOD SED  03/13/2017  . IR ANGIO INTRA EXTRACRAN SEL INTERNAL CAROTID UNI R MOD SED  03/13/2017  . IR ANGIO VERTEBRAL SEL SUBCLAVIAN INNOMINATE UNI R MOD SED  03/13/2017  . IR ANGIOGRAM FOLLOW UP STUDY  03/13/2017  . IR CT HEAD LTD  03/13/2017  . IR RADIOLOGIST EVAL & MGMT  02/19/2017  . IR TRANSCATH/EMBOLIZ  03/13/2017  . LUMBAR LAMINECTOMY/DECOMPRESSION MICRODISCECTOMY  07/17/2011   Procedure: LUMBAR LAMINECTOMY/DECOMPRESSION MICRODISCECTOMY;  Surgeon: Sinclair Ship, MD;   Location: [place];  Service: Orthopedics;  Laterality: Left;  Left sided lumbar 4-5 microdisectomy  . RADIOLOGY WITH ANESTHESIA N/A 03/13/2017   Procedure: EMBOLIZATION;  Surgeon: Luanne Bras, MD;  Location: [place];  Service: Radiology;  Laterality: N/A;  . TEE WITHOUT CARDIOVERSION N/A 03/17/2017   Procedure: TRANSESOPHAGEAL ECHOCARDIOGRAM (TEE);  Surgeon: Dorothy Spark, MD;  [facility] ENDOSCOPY;  Service: Cardiovascular;  Laterality: N/A;  . TONSILLECTOMY    . TOTAL ABDOMINAL HYSTERECTOMY W/ BILATERAL SALPINGOOPHORECTOMY      Allergies: Patient has no known allergies.  Medications: Prior to Admission medications   Medication Sig Start Date End Date Taking? Authorizing Provider  aspirin 325 MG tablet Take 1 tablet (325 mg total) by mouth daily. 04/22/17   [name], FNP  atorvastatin (LIPITOR) 40 MG tablet Take 1 tablet (40 mg total) by mouth every evening. 03/04/17   [name], FNP  clonazePAM (KLONOPIN) 0.5 MG tablet Take 1 tablet (0.5 mg total) by mouth 2 (two) times daily. 1 in the morning and 1 midday 03/18/17   Velvet Bathe, MD  clopidogrel (PLAVIX) 75 MG tablet Take 75 mg by mouth daily.    [provider]  docusate sodium (DOCQLACE) 100 MG capsule TAKE 1 TO 2 CAPSULES BY MOUTH TWICE A DAY AS NEEDED FOR MILD CONSTIPATION 04/22/17   [name], FNP  gabapentin (NEURONTIN) 100 MG capsule Take 100 mg by mouth 3 (three) times daily.    [provider]  levothyroxine (SYNTHROID, LEVOTHROID) 150 MCG tablet TAKE 1 TABLET BY MOUTH DAILY BEFORE BREAKFAST on Monday  through Saturday 05/15/17   [name], FNP  linaclotide [facility]) 145 MCG CAPS capsule Take 1 capsule (145 mcg total) by mouth daily before breakfast. 03/04/17   [name], FNP  loratadine (CLARITIN) 10 MG tablet Take 1 tablet (10 mg total) by mouth daily. 04/22/17   [name], FNP  OLANZapine (ZYPREXA) 20 MG tablet Take 20 mg by mouth at  bedtime.  02/11/17   [provider]  Oxycodone HCl 10 MG TABS Take 1 tablet (10 mg total) by mouth every 6 (six) hours as needed (pain). Patient taking differently: Take 10 mg by mouth 5 (five) times daily as needed (pain).  03/18/17   Velvet Bathe, MD  zolpidem (AMBIEN) 5 MG tablet Take 1 tablet (5 mg total) by mouth at bedtime as needed for sleep. 05/13/17   [name], FNP     Family History  Problem Relation Age of Onset  . Healthy Mother   . Healthy Father     Social History   Socioeconomic History  . Marital status: Divorced    Spouse name: Not on file  . Number of children: 2  . Years of education: 53  . Highest education level: Not on file  Occupational History  . Occupation: Disability  Social Needs  . Financial resource strain: Not on file  . Food insecurity:    Worry: Not on file    Inability: Not on file  . Transportation needs:    Medical: Not on file    Non-medical: Not on file  Tobacco Use  . Smoking status: Current Every Day Smoker    Packs/day: 0.25    Years: 35.00    Pack years: 8.75    Types: Cigarettes    Last attempt to quit: 07/09/1999    Years since quitting: 17.9  . Smokeless tobacco: Never Used  Substance and Sexual Activity  . Alcohol use: No    Alcohol/week: 0.0 oz    Comment: socially  . Drug use: No  . Sexual activity: Not on file  Lifestyle  . Physical activity:    Days per week: Not on file    Minutes per session: Not on file  . Stress: Not on file  Relationships  . Social connections:    Talks on phone: Not on file    Gets together: Not on file    Attends religious service: Not on file    Active member of club or organization: Not on file    Attends meetings of clubs or organizations: Not on file    Relationship status: Not on file  Other Topics Concern  . Not on file  Social History Narrative   Denies abuse and feels safe at home.      Review of Systems: A 12 point ROS discussed and pertinent  positives are indicated in the HPI above.  All other systems are negative.  Review of Systems  Constitutional: Negative for activity change and fever.  HENT: Negative for hearing loss and tinnitus.   Eyes: Positive for visual disturbance.  Respiratory: Negative for cough and wheezing.   Cardiovascular: Negative for chest pain and palpitations.  Neurological: Positive for dizziness. Negative for speech difficulty, weakness, numbness and headaches.  Hematological: Bruises/bleeds easily.  Psychiatric/Behavioral: Negative for behavioral problems and confusion.    Vital Signs: There were no vitals taken for this visit.  Physical Exam  Constitutional: She is oriented to person, place, and time. She appears well-developed and well-nourished. No distress.  Neurological: She  is alert and oriented to person, place, and time.  Psychiatric: She has a normal mood and affect. Her behavior is normal. Judgment and thought content normal.     Imaging: Dg Chest 2 View  Result Date: 05/14/2017 CLINICAL DATA:  Follow-up of pneumonia. No current complaints. Former smoker. EXAM: CHEST - 2 VIEW COMPARISON:  Chest x-ray of May 02, 2017 and chest CT scan of May 03, 2017. FINDINGS: The lungs are adequately inflated. Density in the left lower lobe has improved. The heart and pulmonary vascularity are normal. The mediastinum is all normal in width. There is calcification in the wall of the aortic arch. There are old deformities of the posterior aspects of the left sixth and seventh ribs. The trachea is midline. The bony thorax exhibits no acute abnormality. IMPRESSION: Interval clearing of infiltrate in the left lower lobe. No acute cardiopulmonary abnormality. Thoracic aortic atherosclerosis. Electronically Signed   By: David  Martinique M.D.   On: 05/14/2017 07:59    Labs:  CBC: Recent Labs    05/02/17 2008 05/04/17 0943 05/05/17 0525 05/13/17 1550  WBC 18.7* 9.3 8.0 10.6*  HGB 12.8 11.2* 10.7* 12.7    HCT 41.0 34.8* 33.3* 38.8  PLT 233 243 235 339.0    COAGS: Recent Labs    01/23/17 2236 03/12/17 1400 05/02/17 2008  INR 1.01 1.10 1.10  APTT 30 30  --     BMP: Recent Labs    03/17/17 0527 05/02/17 2008 05/04/17 0943 05/05/17 0525 05/13/17 1550  NA 142 136 142 143 140  K 4.2 4.3 3.7 3.5 4.3  CL 108 103 108 108 105  CO2 24 21* 24 22 25   GLUCOSE 121* 106* 139* 109* 115*  BUN 9 10 9 12 12   CALCIUM 9.0 8.8* 9.3 9.3 9.7  CREATININE 0.70 0.94 0.63 0.63 0.65  GFRNONAA >60 >60 >60 >60  --   GFRAA >60 >60 >60 >60  --     LIVER FUNCTION TESTS: Recent Labs    03/04/17 1112 03/12/17 1400 05/04/17 0943 05/13/17 1550  BILITOT 0.4 0.4 0.9 0.4  AST 19 18 59* 19  ALT 22 17 25 20   ALKPHOS 71 55 67 70  PROT 7.3 5.1* 6.4* 7.3  ALBUMIN 4.4 3.0* 3.5 4.2    TUMOR MARKERS: No results for input(s): AFPTM, CEA, CA199, CHROMGRNA in the last 8760 hours.  Assessment and Plan:  Right para ophthalmic aneurysm s/p embolization 03/13/2017. Left superior hypophyseal aneurysm. Reviewed imaging with patient. Informed patient that her right para ophthalmic aneurysm is stable since embolization. Informed patient that next management course would be treatment of left superior hypophyseal aneurysm. Explained that this aneurysm will be treated the same as her previous aneurysm.  Plan for image-guided cerebral angiogram with intent to treat left superior hypophyseal aneurysm in June/July with Dr. Estanislado Pandy. Informed patient that schedulers will call her to set up this procedure. Will order P2Y12 level today due to patient's complaint of easy bruising. Instructed patient to continue taking Plavix 75 mg once daily. Instructed patient to discontinue taking Aspirin 325 mg, and to begin taking Aspirin 81 mg once daily.  All questions answered and concerns addressed. Patient conveys understanding and agrees with plan.  Thank you for this interesting consult.  I greatly enjoyed meeting Kelsey Hanna and look forward to participating in their care.  A copy of this report was sent to the requesting provider on this date.  Electronically Signed: Earley Abide, PA-C 06/09/2017, 3:23 PM   I spent  a total of 30 minutes in face to face in clinical consultation, greater than 50% of which was counseling/coordinating care for right para ophthalmic aneurysm s/p embolization and left superior hypophyseal aneurysm.

## 2017-06-10 ENCOUNTER — Ambulatory Visit (INDEPENDENT_AMBULATORY_CARE_PROVIDER_SITE_OTHER): Payer: Medicare Other | Admitting: Family

## 2017-06-10 ENCOUNTER — Encounter: Payer: Self-pay | Admitting: Family

## 2017-06-10 ENCOUNTER — Emergency Department (HOSPITAL_COMMUNITY): Payer: Medicare Other

## 2017-06-10 ENCOUNTER — Ambulatory Visit: Payer: Self-pay | Admitting: Family

## 2017-06-10 ENCOUNTER — Telehealth: Payer: Self-pay | Admitting: Student

## 2017-06-10 ENCOUNTER — Encounter (HOSPITAL_COMMUNITY): Payer: Self-pay | Admitting: *Deleted

## 2017-06-10 ENCOUNTER — Observation Stay (HOSPITAL_COMMUNITY)
Admission: EM | Admit: 2017-06-10 | Discharge: 2017-06-12 | Disposition: A | Discharge status: Home / Self Care | Payer: Medicare Other | Attending: Internal Medicine | Admitting: Internal Medicine

## 2017-06-10 ENCOUNTER — Ambulatory Visit: Payer: Self-pay | Admitting: *Deleted

## 2017-06-10 VITALS — BP 138/80 | HR 101 | Temp 98.5°F | Ht 63.0 in

## 2017-06-10 DIAGNOSIS — R251 Tremor, unspecified: Secondary | ICD-10-CM | POA: Diagnosis not present

## 2017-06-10 DIAGNOSIS — F319 Bipolar disorder, unspecified: Secondary | ICD-10-CM | POA: Diagnosis not present

## 2017-06-10 DIAGNOSIS — R2689 Other abnormalities of gait and mobility: Secondary | ICD-10-CM | POA: Insufficient documentation

## 2017-06-10 DIAGNOSIS — S22040D Wedge compression fracture of fourth thoracic vertebra, subsequent encounter for fracture with routine healing: Secondary | ICD-10-CM | POA: Diagnosis not present

## 2017-06-10 DIAGNOSIS — I671 Cerebral aneurysm, nonruptured: Secondary | ICD-10-CM | POA: Diagnosis present

## 2017-06-10 DIAGNOSIS — E039 Hypothyroidism, unspecified: Secondary | ICD-10-CM | POA: Diagnosis present

## 2017-06-10 DIAGNOSIS — R569 Unspecified convulsions: Secondary | ICD-10-CM | POA: Diagnosis not present

## 2017-06-10 DIAGNOSIS — Z765 Malingerer [conscious simulation]: Secondary | ICD-10-CM

## 2017-06-10 DIAGNOSIS — G8929 Other chronic pain: Secondary | ICD-10-CM | POA: Diagnosis not present

## 2017-06-10 DIAGNOSIS — S22030D Wedge compression fracture of third thoracic vertebra, subsequent encounter for fracture with routine healing: Secondary | ICD-10-CM | POA: Diagnosis not present

## 2017-06-10 DIAGNOSIS — R29898 Other symptoms and signs involving the musculoskeletal system: Secondary | ICD-10-CM

## 2017-06-10 DIAGNOSIS — H539 Unspecified visual disturbance: Secondary | ICD-10-CM

## 2017-06-10 DIAGNOSIS — M5126 Other intervertebral disc displacement, lumbar region: Secondary | ICD-10-CM | POA: Diagnosis not present

## 2017-06-10 DIAGNOSIS — R42 Dizziness and giddiness: Secondary | ICD-10-CM | POA: Diagnosis not present

## 2017-06-10 DIAGNOSIS — R2681 Unsteadiness on feet: Secondary | ICD-10-CM | POA: Insufficient documentation

## 2017-06-10 DIAGNOSIS — I6789 Other cerebrovascular disease: Secondary | ICD-10-CM | POA: Diagnosis not present

## 2017-06-10 DIAGNOSIS — R531 Weakness: Secondary | ICD-10-CM | POA: Diagnosis not present

## 2017-06-10 DIAGNOSIS — G4489 Other headache syndrome: Secondary | ICD-10-CM

## 2017-06-10 HISTORY — DX: Low back pain: M54.5

## 2017-06-10 HISTORY — DX: Pneumonia, unspecified organism: J18.9

## 2017-06-10 HISTORY — DX: Malignant neoplasm of uterus, part unspecified: C55

## 2017-06-10 HISTORY — DX: Type 2 diabetes mellitus without complications: E11.9

## 2017-06-10 HISTORY — DX: Other chronic pain: G89.29

## 2017-06-10 HISTORY — DX: Conversion disorder with seizures or convulsions: F44.5

## 2017-06-10 HISTORY — DX: Unspecified chronic bronchitis: J42

## 2017-06-10 HISTORY — DX: Personal history of other medical treatment: Z92.89

## 2017-06-10 HISTORY — DX: Obstructive sleep apnea (adult) (pediatric): G47.33

## 2017-06-10 HISTORY — DX: Dependence on other enabling machines and devices: Z99.89

## 2017-06-10 HISTORY — DX: Gastro-esophageal reflux disease without esophagitis: K21.9

## 2017-06-10 HISTORY — DX: Low back pain, unspecified: M54.50

## 2017-06-10 LAB — URINALYSIS, ROUTINE W REFLEX MICROSCOPIC
Bilirubin Urine: NEGATIVE
Glucose, UA: NEGATIVE mg/dL
HGB URINE DIPSTICK: NEGATIVE
KETONES UR: NEGATIVE mg/dL
Leukocytes, UA: NEGATIVE
NITRITE: NEGATIVE
PROTEIN: NEGATIVE mg/dL
Specific Gravity, Urine: 1.006 (ref 1.005–1.030)
pH: 5 (ref 5.0–8.0)

## 2017-06-10 LAB — COMPREHENSIVE METABOLIC PANEL
ALK PHOS: 63 U/L (ref 38–126)
ALT: 25 U/L (ref 14–54)
AST: 31 U/L (ref 15–41)
Albumin: 4 g/dL (ref 3.5–5.0)
Anion gap: 8 (ref 5–15)
BUN: 8 mg/dL (ref 6–20)
CALCIUM: 9.7 mg/dL (ref 8.9–10.3)
CO2: 25 mmol/L (ref 22–32)
CREATININE: 0.76 mg/dL (ref 0.44–1.00)
Chloride: 111 mmol/L (ref 101–111)
Glucose, Bld: 85 mg/dL (ref 65–99)
Potassium: 3.5 mmol/L (ref 3.5–5.1)
Sodium: 144 mmol/L (ref 135–145)
Total Bilirubin: 0.7 mg/dL (ref 0.3–1.2)
Total Protein: 6.8 g/dL (ref 6.5–8.1)

## 2017-06-10 LAB — CBC WITH DIFFERENTIAL/PLATELET
BASOS PCT: 0 %
Basophils Absolute: 0 10*3/uL (ref 0.0–0.1)
Eosinophils Absolute: 0.1 10*3/uL (ref 0.0–0.7)
Eosinophils Relative: 1 %
HEMATOCRIT: 38.5 % (ref 36.0–46.0)
HEMOGLOBIN: 12.1 g/dL (ref 12.0–15.0)
LYMPHS PCT: 35 %
Lymphs Abs: 3.2 10*3/uL (ref 0.7–4.0)
MCH: 28.1 pg (ref 26.0–34.0)
MCHC: 31.4 g/dL (ref 30.0–36.0)
MCV: 89.5 fL (ref 78.0–100.0)
MONOS PCT: 6 %
Monocytes Absolute: 0.6 10*3/uL (ref 0.1–1.0)
NEUTROS ABS: 5.3 10*3/uL (ref 1.7–7.7)
NEUTROS PCT: 58 %
Platelets: 298 10*3/uL (ref 150–400)
RBC: 4.3 MIL/uL (ref 3.87–5.11)
RDW: 15.1 % (ref 11.5–15.5)
WBC: 9.1 10*3/uL (ref 4.0–10.5)

## 2017-06-10 LAB — RAPID URINE DRUG SCREEN, HOSP PERFORMED
Amphetamines: NOT DETECTED
Barbiturates: NOT DETECTED
Benzodiazepines: NOT DETECTED
Cocaine: NOT DETECTED
OPIATES: NOT DETECTED
Tetrahydrocannabinol: NOT DETECTED

## 2017-06-10 LAB — ETHANOL

## 2017-06-10 MED ORDER — OXYCODONE-ACETAMINOPHEN 5-325 MG PO TABS
1.0000 | ORAL_TABLET | Freq: Once | ORAL | Status: AC
  Administered 2017-06-10: 1 via ORAL
  Filled 2017-06-10: qty 1

## 2017-06-10 NOTE — ED Notes (Signed)
Patient complained that nurse has given her ice chips not ice cubes .

## 2017-06-10 NOTE — Telephone Encounter (Signed)
Received patient's P2Y12 lab value (5 PRU) from 06/09/2017. 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 Estanislado Pandy. Patient planned for image-guided cerebral angiogram with intent to treat left superior hypophyseal aneurysm in June/July with Dr. Estanislado Pandy.  Informed patient of lab results. Instructed patient, per Dr. Estanislado Pandy, that we will be changing her medication doses based on this lab value. Instructed patient to discontinue current Plavix and Aspirin use (patient is currently taking Plavix 75 mg once daily and Aspirin 325 mg once daily). Instructed patient to begin taking Plavix 37.5 mg once daily and Aspirin 81 mg once daily. Patient states that she is legally blind and cannot cut her Plavix in half. Requests that I call her pharmacy who pre-packages all of her medications.  Phoned in prescriptions to [place] [phone])- Plavix 37.5 mg taken once daily and Aspirin 81 mg taken once daily.  Informed patient of prescription changes phoned into [place].  All questions answered and concerns addressed. Patient conveys understanding and agrees with plan.  Bea Graff Gaylin Bulthuis, PA-C 06/10/2017, 10:00 AM

## 2017-06-10 NOTE — ED Triage Notes (Signed)
Pt in from PCP office where she had a brief episode of tremors, pt c/o dizziness and sensitivity to light, pt A&O x4, MAES, no slurred speech, no unilateral weakness, follows commands, pt does not take blood thinners

## 2017-06-10 NOTE — ED Notes (Signed)
Patient currently at MRI

## 2017-06-10 NOTE — ED Provider Notes (Addendum)
[facility] EMERGENCY DEPARTMENT Provider Note   CSN: [id number] Arrival date & time: 06/10/17  1432     History   Chief Complaint Chief Complaint  Patient presents with  . Tremors    HPI JAEDAN SCHUMAN is a 67 y.o. female.  HPI   TRESSIA LABRUM is a 67 y.o. female, with a history of bipolar, ICA aneurysm, DM, dyslipidemia, presenting to the ED with weakness. States she began to feel weakness in her legs this afternoon, notes she was normal at 2 PM (upon initial interview, patient originally stated she only knew she was normal yesterday).  Notes a right posterior headache and neck pain, initially sharp and moderate, now dull and rated 3/10. Also endorses difficulty urinating and a feeling of overall weakness.  Denies vision loss, numbness, incontinence, falls/trauma, syncope, CP, SOB, fever, abdominal pain, acute neck/back pain, or any other complaints.     Past Medical History:  Diagnosis Date  . Anxiety   . Bipolar 1 disorder ([place])   . Cancer [facility])    cancer of uterus...no chemo or radiation  . Chicken pox   . Depression   . Diabetes mellitus    dx within last yr....just takes pills  . Dyslipidemia   . Hypothyroid   . Retinal detachment   . Sleep apnea     Patient Active Problem List   Diagnosis Date Noted  . Sepsis ([place]) 05/03/2017  . MRSA bacteremia   . Brain aneurysm 03/13/2017  . Internal carotid aneurysm   . Slurred speech 03/12/2017  . Hypotension 03/12/2017  . Dysarthria   . Left sided numbness   . Acute left-sided muscle weakness 01/24/2017  . 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 Intracranial aneurysm 01/24/2017  . Encounter for medication review 02/07/2016  . Hypoxia, sleep related 09/07/2015  . Constipation 07/30/2015  . Dizziness 07/30/2015  . Chronic diastolic CHF (congestive heart failure) ([place]) 06/26/2015  . Bilateral lower extremity edema 06/26/2015  . Mitral regurgitation 05/17/2015  . Accidental drug overdose  05/17/2015  . Respiratory failure with hypoxia and hypercapnia ([place]) 05/09/2015  . OSA (obstructive sleep apnea) 05/09/2015  . Overdose 05/09/2015  . Chronic back pain 03/20/2015  . Right shoulder pain 11/14/2014  . Insomnia 11/14/2014  . Psychoses ([place])   . Bacteremia   . Pedal edema 09/12/2014  . Bipolar disorder, current episode manic without psychotic features, severe ([place])   . Bipolar disorder ([place]) 09/05/2014  . Bipolar affective disorder, current episode manic with psychotic symptoms ([place]) 09/05/2014  . Bipolar affective disorder, manic ([place]) 09/03/2014  . Encounter for preadmission testing   . Head revolving around 07/27/2014  . BP (high blood pressure) 05/23/2014  . AF (paroxysmal atrial fibrillation) ([place]) 05/23/2014  . Type 2 diabetes mellitus ([place]) 04/27/2014  . Essential hypertension 04/27/2014  . Hypothyroidism 04/27/2014  . Tobacco abuse 04/27/2014  . Bipolar I disorder ([place]) 04/20/2014  . Acute encephalopathy 04/18/2014  . Acute respiratory failure with hypoxia ([place])   . Encounter for feeding tube placement   . Shortness of breath   . Gout 08/25/2013  . 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 HLD (hyperlipidemia) 08/30/2009    Past Surgical History:  Procedure Laterality Date  . ABDOMINAL HYSTERECTOMY    . IR 3D INDEPENDENT WKST  03/13/2017  . IR ANGIO INTRA EXTRACRAN SEL COM CAROTID INNOMINATE UNI L MOD SED  03/13/2017  . IR ANGIO INTRA EXTRACRAN SEL INTERNAL CAROTID UNI R MOD SED  03/13/2017  . IR ANGIO VERTEBRAL  SEL SUBCLAVIAN INNOMINATE UNI R MOD SED  03/13/2017  . IR ANGIOGRAM FOLLOW UP STUDY  03/13/2017  . IR CT HEAD LTD  03/13/2017  . IR RADIOLOGIST EVAL & MGMT  02/19/2017  . IR TRANSCATH/EMBOLIZ  03/13/2017  . LUMBAR LAMINECTOMY/DECOMPRESSION MICRODISCECTOMY  07/17/2011   Procedure: LUMBAR LAMINECTOMY/DECOMPRESSION MICRODISCECTOMY;  Surgeon: Sinclair Ship, MD;  Location: [facility];  Service: Orthopedics;  Laterality: Left;  Left sided lumbar 4-5 microdisectomy    . RADIOLOGY WITH ANESTHESIA N/A 03/13/2017   Procedure: EMBOLIZATION;  Surgeon: Luanne Bras, MD;  Location: [facility];  Service: Radiology;  Laterality: N/A;  . TEE WITHOUT CARDIOVERSION N/A 03/17/2017   Procedure: TRANSESOPHAGEAL ECHOCARDIOGRAM (TEE);  Surgeon: Dorothy Spark, MD;  Location[facility] ENDOSCOPY;  Service: Cardiovascular;  Laterality: N/A;  . TONSILLECTOMY    . TOTAL ABDOMINAL HYSTERECTOMY W/ BILATERAL SALPINGOOPHORECTOMY       OB History   None      Home Medications    Prior to Admission medications   Medication Sig Start Date End Date Taking? Authorizing Provider  acetaminophen (TYLENOL) 500 MG tablet Take 500 mg by mouth every 6 (six) hours as needed for mild pain.   Yes [provider]  aspirin 325 MG tablet Take 1 tablet (325 mg total) by mouth daily. 04/22/17  Yes [name], FNP  atorvastatin (LIPITOR) 40 MG tablet Take 1 tablet (40 mg total) by mouth every evening. 03/04/17  Yes [name], FNP  clonazePAM (KLONOPIN) 0.5 MG tablet Take 1 tablet (0.5 mg total) by mouth 2 (two) times daily. 1 in the morning and 1 midday 03/18/17  Yes Velvet Bathe, MD  clopidogrel (PLAVIX) 75 MG tablet Take 75 mg by mouth daily.   Yes [provider]  diphenhydrAMINE (BENADRYL) 25 MG tablet Take 25 mg by mouth every 6 (six) hours as needed for allergies.   Yes [provider]  docusate sodium (DOCQLACE) 100 MG capsule TAKE 1 TO 2 CAPSULES BY MOUTH TWICE A DAY AS NEEDED FOR MILD CONSTIPATION Patient taking differently: Take 200 mg by mouth at bedtime. TAKE 1 TO 2 CAPSULES BY MOUTH TWICE A DAY AS NEEDED FOR MILD CONSTIPATION 04/22/17  Yes [name], FNP  gabapentin (NEURONTIN) 100 MG capsule Take 100 mg by mouth 3 (three) times daily.   Yes [provider]  levothyroxine (SYNTHROID, LEVOTHROID) 150 MCG tablet TAKE 1 TABLET BY MOUTH DAILY BEFORE BREAKFAST on Monday through Saturday 05/15/17  Yes [name],  FNP  linaclotide [facility]) 145 MCG CAPS capsule Take 1 capsule (145 mcg total) by mouth daily before breakfast. 03/04/17  Yes [name], FNP  naproxen sodium (ALEVE) 220 MG tablet Take 220 mg by mouth 2 (two) times daily as needed (pain).   Yes [provider]  OLANZapine (ZYPREXA) 20 MG tablet Take 20 mg by mouth at bedtime.  02/11/17  Yes [provider]  Oxycodone HCl 10 MG TABS Take 1 tablet (10 mg total) by mouth every 6 (six) hours as needed (pain). Patient taking differently: Take 10 mg by mouth 4 (four) times daily.  03/18/17  Yes Velvet Bathe, MD  zolpidem (AMBIEN) 5 MG tablet Take 1 tablet (5 mg total) by mouth at bedtime as needed for sleep. Patient taking differently: Take 10 mg by mouth at bedtime.  05/13/17  Yes [name], FNP  loratadine (CLARITIN) 10 MG tablet Take 1 tablet (10 mg total) by mouth daily. Patient not taking: Reported on 06/10/2017 04/22/17  [name], FNP    Family History Family History  Problem Relation Age of Onset  . Healthy Mother   . Healthy Father     Social History Social History   Tobacco Use  . Smoking status: Current Every Day Smoker    Packs/day: 0.25    Years: 35.00    Pack years: 8.75    Types: Cigarettes    Last attempt to quit: 07/09/1999    Years since quitting: 17.9  . Smokeless tobacco: Never Used  Substance Use Topics  . Alcohol use: No    Alcohol/week: 0.0 oz    Comment: socially  . Drug use: No     Allergies   Patient has no known allergies.   Review of Systems Review of Systems  Constitutional: Negative for fever.  Respiratory: Negative for shortness of breath.   Cardiovascular: Negative for chest pain and leg swelling.  Gastrointestinal: Negative for abdominal pain, diarrhea, nausea and vomiting.  Neurological: Positive for weakness. Negative for dizziness, syncope, light-headedness and numbness.  All other systems reviewed and are negative.    Physical  Exam Updated Vital Signs BP 121/67   Pulse 83   Temp 98.2 F (36.8 C)   Resp (!) 28   Ht 5\' 3"  (1.6 m)   Wt 83.9 kg (185 lb)   SpO2 95%   BMI 32.77 kg/m   Physical Exam  Constitutional: She is oriented to person, place, and time. She appears well-developed and well-nourished. No distress.  HENT:  Head: Normocephalic and atraumatic.  Eyes: Conjunctivae and EOM are normal.  Patient states she has no functional vision in the left eye and only pinpoint vision in the right eye.  She was able to perform some EOM testing using the right eye.  Neck: Neck supple.  Cardiovascular: Normal rate, regular rhythm, normal heart sounds and intact distal pulses.  Pulmonary/Chest: Effort normal and breath sounds normal. No respiratory distress.  Abdominal: Soft. There is no tenderness. There is no guarding.  Musculoskeletal: She exhibits no edema.  No midline spinal tenderness.  No tenderness to the patient's upper or lower extremities.  Lymphadenopathy:    She has no cervical adenopathy.  Neurological: She is alert and oriented to person, place, and time.  No noted sensory deficits. Cranial nerves III through XII grossly intact. No noted speech or swallowing dysfunction. Grip strength equal bilaterally. No arm drift. No facial droop. Bicep/tricep strength 4/5 bilaterally. Strength in the bilateral lower extremities 2/5.   Skin: Skin is warm and dry. She is not diaphoretic.  Psychiatric: She has a normal mood and affect. Her behavior is normal.  Nursing note and vitals reviewed.    ED Treatments / Results  Labs (all labs ordered are listed, but only abnormal results are displayed) Labs Reviewed  URINALYSIS, ROUTINE W REFLEX MICROSCOPIC - Abnormal; Notable for the following components:      Result Value   Color, Urine STRAW (*)    All other components within normal limits  COMPREHENSIVE METABOLIC PANEL  ETHANOL  RAPID URINE DRUG SCREEN, HOSP PERFORMED  CBC WITH DIFFERENTIAL/PLATELET     EKG EKG Interpretation  Date/Time:  Wednesday June 10 2017 15:18:45 EDT Ventricular Rate:  90 PR Interval:    QRS Duration: 90 QT Interval:  384 QTC Calculation: 470 R Axis:   -21 Text Interpretation:  Sinus rhythm LAE, consider biatrial enlargement Borderline left axis deviation Baseline wander in lead(s) II III aVF No significant change since last tracing Confirmed by Merrily Pew [phone])  on 06/10/2017 11:17:36 PM   Radiology Mr Brain Wo Contrast  Result Date: 06/10/2017 CLINICAL DATA:  67 y/o F; brief episode of tremors with dizziness and sensitivity to light. EXAM: MRI HEAD WITHOUT CONTRAST TECHNIQUE: Multiplanar, multiecho pulse sequences of the brain and surrounding structures were obtained without intravenous contrast. COMPARISON:  05/02/2017 MRI of the head. FINDINGS: Brain: No acute infarction, hemorrhage, hydrocephalus, extra-axial collection or mass lesion. Cavum septum pellucidum. Few stablenonspecific foci of T2 FLAIR hyperintense signal abnormality in subcortical and periventricular white matter are compatible with chronic microvascular ischemic changes for age. Stablebrain parenchymal volume loss. Stable punctate focus of susceptibility hypointensity within the right frontal lobe compatible with hemosiderin deposition of chronic microhemorrhage. Vascular: Normal flow voids. Skull and upper cervical spine: Normal marrow signal. Sinuses/Orbits: Negative. Other: Bilateral intra-ocular lens replacement. IMPRESSION: 1. No acute intracranial abnormality identified. 2. Stable chronic microvascular ischemic changes and parenchymal volume loss of the brain. Electronically Signed   By: Kristine Garbe M.D.   On: 06/10/2017 19:40   Mr Thoracic Spine W Contrast  Result Date: 06/11/2017 CLINICAL DATA:  Bilateral lower extremity weakness. History of RIGHT ICA aneurysm, diabetes and lumbar spine surgery. EXAM: MRI THORACIC SPINE WITH CONTRAST TECHNIQUE: Multiplanar, multisequence  MR imaging of the thoracic spine was performed following the administration of intravenous contrast. COMPARISON:  MRI lumbar spine June 10, 2017 and MRI of the head June 10, 2017 and CT chest May 03, 2017. FINDINGS: ALIGNMENT: Maintenance of the thoracic kyphosis. No malalignment. VERTEBRAE/DISCS: Old mild-to-moderate T3 and T4 compression fractures with approximately 30% superior endplate height loss. Minimal old T5 superior endplate compression fracture. The remaining thoracic vertebral bodies intact. Intervertebral disc morphology and signal maintained. No abnormal or acute bone marrow signal. Congenitally capacious thoracic spinal canal. Degenerative change of the included cervical spine. Multiple old LEFT rib fractures. CORD: Thoracic spinal cord is normal morphology and signal characteristics to the level of the conus medullaris which terminates at below T12-L1. PREVERTEBRAL AND PARASPINAL SOFT TISSUES:  Nonacute. DISC LEVELS: No disc bulge, canal stenosis or neural foraminal narrowing at any level. IMPRESSION: 1. Old mild-to-moderate T3 and T4 compression fractures, minimal old T5 compression fracture without acute osseous process. 2. Congenitally capacious thoracic spinal canal without canal stenosis or neural foraminal narrowing. Electronically Signed   By: Elon Alas M.D.   On: 06/11/2017 00:43   Mr Lumbar Spine Wo Contrast  Result Date: 06/10/2017 CLINICAL DATA:  Initial evaluation for acute generalized muscle weakness. EXAM: MRI LUMBAR SPINE WITHOUT CONTRAST TECHNIQUE: Multiplanar, multisequence MR imaging of the lumbar spine was performed. No intravenous contrast was administered. COMPARISON:  Prior MRI from 07/28/2013. FINDINGS: Segmentation: Normal segmentation. Lowest well-formed disc labeled the L5-S1 level. Alignment: Vertebral bodies normally aligned with preservation of the normal lumbar lordosis. No listhesis. Vertebrae: Vertebral body heights maintained without evidence for acute  or interval fracture. Scattered chronic endplate Schmorl's nodes present within the lower lumbar spine. Bone marrow signal intensity within normal limits. No discrete or worrisome osseous lesions. Reactive endplate changes present about the L3-4 through L5-S1 interspaces. Conus medullaris and cauda equina: Conus extends to the L1 level. Conus and cauda equina appear normal. Paraspinal and other soft tissues: Remote postsurgical changes present within the lower paraspinous soft tissues. No acute soft tissue abnormality. Visualized visceral structures are normal. Disc levels: L1-2: Mild diffuse disc bulge. No significant canal or foraminal stenosis. L2-3: Mild diffuse disc bulge. Mild facet and ligament flavum hypertrophy. No canal or neural foraminal stenosis. L3-4: Diffuse disc bulge with  intervertebral disc space narrowing. Superimposed left extraforaminal disc protrusion contacts and displaces laterally the exiting left L3 nerve root. Mild facet and ligament flavum hypertrophy. Resultant mild spinal stenosis. Foramina remain patent. L4-5: Diffuse disc bulge with intervertebral disc space narrowing. Disc bulging slightly eccentric to the left. Associated chronic reactive endplate changes with marginal endplate osteophytic spurring. Patient status post left laminectomy with micro discectomy. Residual central/left subarticular disc protrusion extending into the left lateral recess with secondary moderate left lateral recess narrowing, similar to previous. Central canal remains patent. Mild to moderate left L4 foraminal stenosis. No significant right foraminal narrowing. L5-S1: Chronic intervertebral disc space narrowing with reactive endplate changes. Broad posterior disc osteophyte mildly flattens the ventral thecal sac. Prior bilateral laminectomies. No new or recurrent disc herniation. No central canal stenosis. Mild left-sided facet hypertrophy. Mild to moderate bilateral foraminal narrowing is unchanged.  IMPRESSION: 1. No acute abnormality within the lumbar spine. 2. Left extraforaminal disc protrusion at L3-4, displacing the exiting left L3 nerve root as it courses out of the neural foramen. 3. Postoperative changes from prior left laminectomy with micro discectomy at L4-5. Residual small central/left subarticular disc protrusion with associated left lateral recess stenosis is stable from previous. 4. Mild to moderate left L4 and bilateral L5 foraminal narrowing related to disc bulge and facet hypertrophy. Electronically Signed   By: Jeannine Boga M.D.   On: 06/10/2017 20:02    Procedures Procedures (including critical care time)  Medications Ordered in ED Medications  oxyCODONE-acetaminophen (PERCOCET/ROXICET) 5-325 MG per tablet 1 tablet (1 tablet Oral Given 06/10/17 2024)  gadobenate dimeglumine (MULTIHANCE) injection 18 mL (18 mLs Intravenous Contrast Given 06/11/17 0029)     Initial Impression / Assessment and Plan / ED Course  I have reviewed the triage vital signs and the nursing notes.  Pertinent labs & imaging results that were available during my care of the patient were reviewed by me and considered in my medical decision making (see chart for details).  Clinical Course as of Jun 11 212  Wed Jun 10, 2017  1739 Spoke with Dr. Lorraine Lax, neurologist.  Recommends MRI brain and MRI lumbar spine without contrast.  States he will pass this patient on to the oncoming neurologist this evening who will come evaluate the patient.   [SJ]  2204 Spoke with Dr. Cheral Marker, neurologist, to review this patient with him and see if he has had a chance to see the patient.  He has not had yet had a chance to evaluate the patient, however, based on patient's symptoms and lack of acute findings on the lumbar MRI, requests thoracic MRI with contrast.   [SJ]  2218 Patient again reevaluated.  Strength in right leg is now 5/5.  Able to elicit 2+ patellar and Achilles reflexes. Strength in left leg  continues to be 2/5.  Unable to elicit reflexes on the left.   [SJ]  2239 Dr. Cheral Marker, neurologist, examined the patient.  States his suspicion for true neurologic process is now low.  Recommends continuing with thoracic MRI.  If this is normal and patient can ambulate she may be discharged.  Otherwise she will need to be admitted for physical therapy.   [SJ]  Thu Jun 11, 2017  0142 Attempted to ambulate patient. States she is too weak to walk on her own. Required 2 person assist while ambulating.    [SJ]  0214 Spoke with Dr. Tamala Julian, hospitalist. Agrees to admit the patient.   [SJ]    Clinical Course User Index [SJ]  Lorayne Bender, PA-C    Patient presents with complaint of lower extremity weakness.  No acute abnormalities on the patient's imaging studies.  Patient weakness in the right lower extremity improve, however, maintained some weakness in the left lower extremity.  Patient lives alone and is currently requiring 2 person assist for ambulation, when she is normally ambulatory without assistance.  Admission for physical therapy evaluation and due to being unsafe for discharge.  Findings and plan of care discussed with Merrily Pew, MD.   Vitals:   06/10/17 2010 06/10/17 2030 06/10/17 2142 06/11/17 0050  BP:  (!) 157/76 114/74 121/67  Pulse:  91 88 83  Resp:  16 16 (!) 28  Temp: 98.2 F (36.8 C)     TempSrc:      SpO2:  100% 99% 95%  Weight:      Height:          Final Clinical Impressions(s) / ED Diagnoses   Final diagnoses:  Weakness    ED Discharge Orders    None         Layla Maw 06/11/17 [id number]    Mesner, Corene Cornea, MD 06/11/17 [phone]

## 2017-06-10 NOTE — Telephone Encounter (Signed)
RN with [facility] called to report pt has had loose stools x 1 week. Pt is present during call. Reports 6 episodes over last 24 hours; small to moderate amounts. States stool is watery with a few incontinent episodes. Denies nausea, abdominal pain. Mild lightheadedness "comes and goes",  not presently. States is staying hydrated. Afebrile; B/P 120/78 HRR 72.  States stool was "dark, tarry" at onset, now "grayish at times." No visible blood. Unable to make appt today, appt secured with A. Shambley for tomorrow 0930.  Care advise given per protocol. Appt for today at 1300 with L. Valere Dross cancelled; pt stated she was not aware and did not arrange transportation. Per RN pt has STMD's.  Reason for Disposition . [1] MODERATE diarrhea (e.g., 4-6 times / day more than normal) AND [2] present > 48 hours (2 days)  Answer Assessment - Initial Assessment Questions 1. DIARRHEA SEVERITY: "How bad is the diarrhea?" "How many extra stools have you had in the past 24 hours than normal?"    - MILD: Few loose or mushy BMs; increase of 1-3 stools over normal daily number of stools; mild increase in ostomy output.   - MODERATE: Increase of 4-6 stools daily over normal; moderate increase in ostomy output.   - SEVERE (or Worst Possible): Increase of 7 or more stools daily over normal; moderate increase in ostomy output; incontinence.     6 times 2. ONSET: "When did the diarrhea begin?"      1 week ago 3. BM CONSISTENCY: "How loose or watery is the diarrhea?"      watery 4. VOMITING: "Are you also vomiting?" If so, ask: "How many times in the past 24 hours?"      no 5. ABDOMINAL PAIN: "Are you having any abdominal pain?" If yes: "What does it feel like?" (e.g., crampy, dull, intermittent, constant)      no 6. ABDOMINAL PAIN SEVERITY: If present, ask: "How bad is the pain?"  (e.g., Scale 1-10; mild, moderate, or severe)    - MILD (1-3): doesn't interfere with normal activities, abdomen soft and  not tender to touch     - MODERATE (4-7): interferes with normal activities or awakens from sleep, tender to touch     - SEVERE (8-10): excruciating pain, doubled over, unable to do any normal activities       n/a 7. ORAL INTAKE: If vomiting, "Have you been able to drink liquids?" "How much fluids have you had in the past 24 hours?"     n/a 8. HYDRATION: "Any signs of dehydration?" (e.g., dry mouth [not just dry lips], too weak to stand, dizziness, new weight loss) "When did you last urinate?"     no  10. OTHER SYMPTOMS: "Do you have any other symptoms?" (e.g., fever, blood in stool)     Dizziness at times  Protocols used: DIARRHEA-A-AH

## 2017-06-10 NOTE — ED Notes (Signed)
Patient given ice chips. 

## 2017-06-10 NOTE — ED Notes (Signed)
Patient complained to the nurse that he is not moving fast enough to provide her food /drink , nurse gave her water and [place] sandwich to eat after she passed  swallowing evaluation . Nurse explained to pt.- process /delay and high census/acuity and that providers / nurses are trying their best to take care of patients.

## 2017-06-10 NOTE — ED Notes (Addendum)
PA explained plan of care and additional MRI test to pt.

## 2017-06-10 NOTE — Consult Note (Addendum)
NEURO HOSPITALIST CONSULT NOTE   Requestig physician: Dr. Dayna Barker  Reason for Consult: Lower extremity weakness  History obtained from:  Patient and Chart    HPI:                                                                                                                                          Kelsey Hanna is an 67 y.o. female with right para ophthalmic aneurysm s/p embolization and 1/25 by Dr. Estanislado Pandy. She was seen in his clinic yesterday for management of an additional aneurysm - left superior hypophyseal - with plan for cerebral angiogram with intent to treat in June/July. Today, due to P2Y12 lab value of 5 PRU, she was advised to lower her Plavix and ASA doses from 75-325 to 37.5-81.   Today, she presents to the ED from her PCPs office where she had a brief episode of tremors. She complained of dizziness and sensitivity to light and was A&O x4, without slurred speech or unilateral weakness in Triage.   During her work up, her complaints changed. At some point she endorsed a new chief complaint of bilateral lower extremity weakness. An MRI brain was obtained which was negative for acute stroke. MRI L-spine showed degenerative disc disease with nerve root displacement, but no compression and conus medullaris/cauda equina appeared normal. She has been sent back to MRI for a scan of her thoracic spine. She is seen in the MRI suite by neurology.   The patient's communication pattern is tangential, circumstantial and at times oppositional/accusatory. Therefore, Shawn Joy's note was also reviewed: "Kelsey Hanna is a 67 y.o. female, with a history of bipolar, ICA aneurysm, DM, dyslipidemia, presenting to the ED with weakness. States she began to feel weakness in her legs this afternoon, notes she was normal at 2 PM (upon initial interview, patient originally stated she only knew she was normal yesterday).  Notes a right posterior headache and neck pain, initially  sharp and moderate, now dull and rated 3/10. Also endorses difficulty urinating and a feeling of overall weakness. Denies vision loss, numbness, incontinence, falls/trauma, syncope, CP, SOB, fever, abdominal pain, acute neck/back pain, or any other complaints."   Past Medical History:  Diagnosis Date  . Anxiety   . Bipolar 1 disorder ([place])   . Cancer [facility])    cancer of uterus...no chemo or radiation  . Chicken pox   . Depression   . Diabetes mellitus    dx within last yr....just takes pills  . Dyslipidemia   . Hypothyroid   . Retinal detachment   . Sleep apnea     Past Surgical History:  Procedure Laterality Date  . ABDOMINAL HYSTERECTOMY    . IR 3D INDEPENDENT WKST  03/13/2017  . IR ANGIO INTRA EXTRACRAN SEL COM  CAROTID INNOMINATE UNI L MOD SED  03/13/2017  . IR ANGIO INTRA EXTRACRAN SEL INTERNAL CAROTID UNI R MOD SED  03/13/2017  . IR ANGIO VERTEBRAL SEL SUBCLAVIAN INNOMINATE UNI R MOD SED  03/13/2017  . IR ANGIOGRAM FOLLOW UP STUDY  03/13/2017  . IR CT HEAD LTD  03/13/2017  . IR RADIOLOGIST EVAL & MGMT  02/19/2017  . IR TRANSCATH/EMBOLIZ  03/13/2017  . LUMBAR LAMINECTOMY/DECOMPRESSION MICRODISCECTOMY  07/17/2011   Procedure: LUMBAR LAMINECTOMY/DECOMPRESSION MICRODISCECTOMY;  Surgeon: Sinclair Ship, MD;  Location: [place];  Service: Orthopedics;  Laterality: Left;  Left sided lumbar 4-5 microdisectomy  . RADIOLOGY WITH ANESTHESIA N/A 03/13/2017   Procedure: EMBOLIZATION;  Surgeon: Luanne Bras, MD;  Location: [place];  Service: Radiology;  Laterality: N/A;  . TEE WITHOUT CARDIOVERSION N/A 03/17/2017   Procedure: TRANSESOPHAGEAL ECHOCARDIOGRAM (TEE);  Surgeon: Dorothy Spark, MD;  Location: [facility] ENDOSCOPY;  Service: Cardiovascular;  Laterality: N/A;  . TONSILLECTOMY    . TOTAL ABDOMINAL HYSTERECTOMY W/ BILATERAL SALPINGOOPHORECTOMY      Family History  Problem Relation Age of Onset  . Healthy Mother   . Healthy Father               Social History:  reports that she has  been smoking cigarettes.  She has a 8.75 pack-year smoking history. She has never used smokeless tobacco. She reports that she does not drink alcohol or use drugs.  No Known Allergies  HOME MEDICATIONS:                                                                                                                       ROS:                                                                                                                                       As per HPI.    Blood pressure 114/74, pulse 88, temperature 98.2 F (36.8 C), resp. rate 16, height 5\' 3"  (1.6 m), weight 83.9 kg (185 lb), SpO2 99 %.   General Examination:  General: Morbidly obese HEENT-  [place]/AT    Lungs- Respirations unlabored   Extremities- No edema  Neurological Examination Mental Status: Alert, oriented. Some tangentiality and circumstantiality. Affect is somewhat pressured. Speech fluent without evidence of aphasia.  Able to follow all commands without difficulty. Some inappropriate comments such as "I'm into [employer]" and "you put a gash in my foot" after plantar stimulation (no gash noted when then inspected) and stating "are you going to do anything about that CNA?" in reference to her apparently not receiving a sandwich from him when she asked.  Cranial Nerves: II: Patch to left eye secondary to remote trauma per patient. Constricted visual fields OD all 4 quadrants except nasal upper when tested, but reacts normally to peripheral stimuli when distracted. Right pupil reactive.  III,IV, VI: Ptosis not present, EOMI.   V,VII: Smile symmetric, facial temp sensation decreased on the left VIII: hearing intact to voice IX,X: No hypophonia XI: Symmetric XII: midline tongue extension Motor: RUE 5/5 LUE with waxing and waning strength responding to coaching and rapid changes in direction and amplitude of counterforce  exerted by examiner; max strength elicited is 5/5 RLE: 5/5 LLE: Initially states it can't be moved, then reacts to right achilles reflex with purposeful internal rotation of there RLE, not resembling a crossed adductor response. When coached, will extend at knee with 4-/5 strength intermittently. With plantar stimulation, the patient withdraws LLE with 5/5 strength. Sensory: Subjectively decreased temp sensation RUE and RLE. Subjectively decreased FT sensation LLE.  Deep Tendon Reflexes: 2+ bilateral brachioradialis, biceps, patellae. 1+ achilles bilaterally.  Plantars: Right: downgoing   Left: Equivocal with light stimulation, vigorous withdrawal of LLE to moderate stimulation Cerebellar: Slow, laborious FNF bilaterally, alternately misses and correctly contacts targets bilaterally, which appears non-physiological and not consistent with an ataxia Gait: Unable to assess   Lab Results: Basic Metabolic Panel: Recent Labs  Lab 06/10/17 1756  NA 144  K 3.5  CL 111  CO2 25  GLUCOSE 85  BUN 8  CREATININE 0.76  CALCIUM 9.7    CBC: Recent Labs  Lab 06/10/17 1756  WBC 9.1  NEUTROABS 5.3  HGB 12.1  HCT 38.5  MCV 89.5  PLT 298    Cardiac Enzymes: No results for input(s): CKTOTAL, CKMB, CKMBINDEX, TROPONINI in the last 168 hours.  Lipid Panel: No results for input(s): CHOL, TRIG, HDL, CHOLHDL, VLDL, LDLCALC in the last 168 hours.  Imaging: Mr Brain Wo Contrast  Result Date: 06/10/2017 CLINICAL DATA:  67 y/o F; brief episode of tremors with dizziness and sensitivity to light. EXAM: MRI HEAD WITHOUT CONTRAST TECHNIQUE: Multiplanar, multiecho pulse sequences of the brain and surrounding structures were obtained without intravenous contrast. COMPARISON:  05/02/2017 MRI of the head. FINDINGS: Brain: No acute infarction, hemorrhage, hydrocephalus, extra-axial collection or mass lesion. Cavum septum pellucidum. Few stablenonspecific foci of T2 FLAIR hyperintense signal abnormality in  subcortical and periventricular white matter are compatible with chronic microvascular ischemic changes for age. Stablebrain parenchymal volume loss. Stable punctate focus of susceptibility hypointensity within the right frontal lobe compatible with hemosiderin deposition of chronic microhemorrhage. Vascular: Normal flow voids. Skull and upper cervical spine: Normal marrow signal. Sinuses/Orbits: Negative. Other: Bilateral intra-ocular lens replacement. IMPRESSION: 1. No acute intracranial abnormality identified. 2. Stable chronic microvascular ischemic changes and parenchymal volume loss of the brain. Electronically Signed   By: Kristine Garbe M.D.   On: 06/10/2017 19:40   Mr Lumbar Spine Wo Contrast  Result Date: 06/10/2017 CLINICAL DATA:  Initial evaluation  for acute generalized muscle weakness. EXAM: MRI LUMBAR SPINE WITHOUT CONTRAST TECHNIQUE: Multiplanar, multisequence MR imaging of the lumbar spine was performed. No intravenous contrast was administered. COMPARISON:  Prior MRI from 07/28/2013. FINDINGS: Segmentation: Normal segmentation. Lowest well-formed disc labeled the L5-S1 level. Alignment: Vertebral bodies normally aligned with preservation of the normal lumbar lordosis. No listhesis. Vertebrae: Vertebral body heights maintained without evidence for acute or interval fracture. Scattered chronic endplate Schmorl's nodes present within the lower lumbar spine. Bone marrow signal intensity within normal limits. No discrete or worrisome osseous lesions. Reactive endplate changes present about the L3-4 through L5-S1 interspaces. Conus medullaris and cauda equina: Conus extends to the L1 level. Conus and cauda equina appear normal. Paraspinal and other soft tissues: Remote postsurgical changes present within the lower paraspinous soft tissues. No acute soft tissue abnormality. Visualized visceral structures are normal. Disc levels: L1-2: Mild diffuse disc bulge. No significant canal or foraminal  stenosis. L2-3: Mild diffuse disc bulge. Mild facet and ligament flavum hypertrophy. No canal or neural foraminal stenosis. L3-4: Diffuse disc bulge with intervertebral disc space narrowing. Superimposed left extraforaminal disc protrusion contacts and displaces laterally the exiting left L3 nerve root. Mild facet and ligament flavum hypertrophy. Resultant mild spinal stenosis. Foramina remain patent. L4-5: Diffuse disc bulge with intervertebral disc space narrowing. Disc bulging slightly eccentric to the left. Associated chronic reactive endplate changes with marginal endplate osteophytic spurring. Patient status post left laminectomy with micro discectomy. Residual central/left subarticular disc protrusion extending into the left lateral recess with secondary moderate left lateral recess narrowing, similar to previous. Central canal remains patent. Mild to moderate left L4 foraminal stenosis. No significant right foraminal narrowing. L5-S1: Chronic intervertebral disc space narrowing with reactive endplate changes. Broad posterior disc osteophyte mildly flattens the ventral thecal sac. Prior bilateral laminectomies. No new or recurrent disc herniation. No central canal stenosis. Mild left-sided facet hypertrophy. Mild to moderate bilateral foraminal narrowing is unchanged. IMPRESSION: 1. No acute abnormality within the lumbar spine. 2. Left extraforaminal disc protrusion at L3-4, displacing the exiting left L3 nerve root as it courses out of the neural foramen. 3. Postoperative changes from prior left laminectomy with micro discectomy at L4-5. Residual small central/left subarticular disc protrusion with associated left lateral recess stenosis is stable from previous. 4. Mild to moderate left L4 and bilateral L5 foraminal narrowing related to disc bulge and facet hypertrophy. Electronically Signed   By: Jeannine Boga M.D.   On: 06/10/2017 20:02    Assessment: 67 year old female presenting with stated  complaint of bilateral lower extremity paralysis, evolving to stated LLE paralysis with intact RLE.  1. Neurological exam has multiple functional components. No definite lateralizing findings seen. Subjective sensory changes on the left.  2. MRI brain negative for acute infarction. Stable chronic microvascular ischemic changes and parenchymal volume loss of the brain are noted.  3. MRI L-spine reveals lumbar spondylosis and postoperative changes. No acute abnormality is noted. A left extraforaminal disc protrusion at L3-4 displaces the exiting left L3 nerve root as it courses out of the neural foramen.  Recommendations: 1. MRI thoracic spine.  2. If MRI T-spine is negative, discuss options for inpatient PT versus discharge home with patient.  3. Psychology consult   Electronically signed: Dr. Kerney Elbe 06/10/2017, 10:22 PM

## 2017-06-10 NOTE — Progress Notes (Signed)
Kelsey Hanna is a 66 y.o. female with the following history as recorded in EpicCare:  Patient Active Problem List   Diagnosis Date Noted  . Sepsis ([place]) 05/03/2017  . MRSA bacteremia   . Brain aneurysm 03/13/2017  . Internal carotid aneurysm   . Slurred speech 03/12/2017  . Hypotension 03/12/2017  . Dysarthria   . Left sided numbness   . Acute left-sided muscle weakness 01/24/2017  . 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 Intracranial aneurysm 01/24/2017  . Encounter for medication review 02/07/2016  . Hypoxia, sleep related 09/07/2015  . Constipation 07/30/2015  . Dizziness 07/30/2015  . Chronic diastolic CHF (congestive heart failure) ([place]) 06/26/2015  . Bilateral lower extremity edema 06/26/2015  . Mitral regurgitation 05/17/2015  . Accidental drug overdose 05/17/2015  . Respiratory failure with hypoxia and hypercapnia ([place]) 05/09/2015  . OSA (obstructive sleep apnea) 05/09/2015  . Overdose 05/09/2015  . Chronic back pain 03/20/2015  . Right shoulder pain 11/14/2014  . Insomnia 11/14/2014  . Psychoses ([place])   . Bacteremia   . Pedal edema 09/12/2014  . Bipolar disorder, current episode manic without psychotic features, severe ([place])   . Bipolar disorder ([employer]) 09/05/2014  . Bipolar affective disorder, current episode manic with psychotic symptoms ([place]) 09/05/2014  . Bipolar affective disorder, manic ([place]) 09/03/2014  . Encounter for preadmission testing   . Head revolving around 07/27/2014  . BP (high blood pressure) 05/23/2014  . AF (paroxysmal atrial fibrillation) ([place]) 05/23/2014  . Type 2 diabetes mellitus ([place]) 04/27/2014  . Essential hypertension 04/27/2014  . Hypothyroidism 04/27/2014  . Tobacco abuse 04/27/2014  . Bipolar I disorder ([place]) 04/20/2014  . Acute encephalopathy 04/18/2014  . Acute respiratory failure with hypoxia ([place])   . Encounter for feeding tube placement   . Shortness of breath   . Gout 08/25/2013  . 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  HLD (hyperlipidemia) 08/30/2009    Current Outpatient Medications  Medication Sig Dispense Refill  . acetaminophen (TYLENOL) 500 MG tablet Take 500 mg by mouth every 6 (six) hours as needed for mild pain.    Marland Kitchen aspirin 325 MG tablet Take 1 tablet (325 mg total) by mouth daily. 30 tablet 3  . atorvastatin (LIPITOR) 40 MG tablet Take 1 tablet (40 mg total) by mouth every evening. 90 tablet 1  . clonazePAM (KLONOPIN) 0.5 MG tablet Take 1 tablet (0.5 mg total) by mouth 2 (two) times daily. 1 in the morning and 1 midday 20 tablet 0  . clopidogrel (PLAVIX) 75 MG tablet Take 75 mg by mouth daily.    . diphenhydrAMINE (BENADRYL) 25 MG tablet Take 25 mg by mouth every 6 (six) hours as needed for allergies.    Marland Kitchen docusate sodium (DOCQLACE) 100 MG capsule TAKE 1 TO 2 CAPSULES BY MOUTH TWICE A DAY AS NEEDED FOR MILD CONSTIPATION (Patient taking differently: Take 200 mg by mouth at bedtime. TAKE 1 TO 2 CAPSULES BY MOUTH TWICE A DAY AS NEEDED FOR MILD CONSTIPATION) 60 capsule 2  . gabapentin (NEURONTIN) 100 MG capsule Take 100 mg by mouth 3 (three) times daily.    Marland Kitchen levothyroxine (SYNTHROID, LEVOTHROID) 150 MCG tablet TAKE 1 TABLET BY MOUTH DAILY BEFORE BREAKFAST on Monday through Saturday 90 tablet 0  . linaclotide (LINZESS) 145 MCG CAPS capsule Take 1 capsule (145 mcg total) by mouth daily before breakfast. 90 capsule 1  . loratadine (CLARITIN) 10 MG tablet Take 1 tablet (10 mg total) by mouth daily. (Patient not taking: Reported on 06/10/2017) 30  tablet 3  . naproxen sodium (ALEVE) 220 MG tablet Take 220 mg by mouth 2 (two) times daily as needed (pain).    Marland Kitchen OLANZapine (ZYPREXA) 20 MG tablet Take 20 mg by mouth at bedtime.     . Oxycodone HCl 10 MG TABS Take 1 tablet (10 mg total) by mouth every 6 (six) hours as needed (pain). (Patient taking differently: Take 10 mg by mouth 4 (four) times daily. ) 15 tablet 0  . zolpidem (AMBIEN) 5 MG tablet Take 1 tablet (5 mg total) by mouth at bedtime as needed for sleep.  (Patient taking differently: Take 10 mg by mouth at bedtime. ) 30 tablet 1   No current facility-administered medications for this visit.     Allergies: Patient has no known allergies.  Past Medical History:  Diagnosis Date  . Anxiety   . Bipolar 1 disorder ([place])   . Cancer [facility])    cancer of uterus...no chemo or radiation  . Chicken pox   . Depression   . Diabetes mellitus    dx within last yr....just takes pills  . Dyslipidemia   . Hypothyroid   . Retinal detachment   . Sleep apnea     Past Surgical History:  Procedure Laterality Date  . ABDOMINAL HYSTERECTOMY    . IR 3D INDEPENDENT WKST  03/13/2017  . IR ANGIO INTRA EXTRACRAN SEL COM CAROTID INNOMINATE UNI L MOD SED  03/13/2017  . IR ANGIO INTRA EXTRACRAN SEL INTERNAL CAROTID UNI R MOD SED  03/13/2017  . IR ANGIO VERTEBRAL SEL SUBCLAVIAN INNOMINATE UNI R MOD SED  03/13/2017  . IR ANGIOGRAM FOLLOW UP STUDY  03/13/2017  . IR CT HEAD LTD  03/13/2017  . IR RADIOLOGIST EVAL & MGMT  02/19/2017  . IR TRANSCATH/EMBOLIZ  03/13/2017  . LUMBAR LAMINECTOMY/DECOMPRESSION MICRODISCECTOMY  07/17/2011   Procedure: LUMBAR LAMINECTOMY/DECOMPRESSION MICRODISCECTOMY;  Surgeon: Sinclair Ship, MD;  Location: [place];  Service: Orthopedics;  Laterality: Left;  Left sided lumbar 4-5 microdisectomy  . RADIOLOGY WITH ANESTHESIA N/A 03/13/2017   Procedure: EMBOLIZATION;  Surgeon: Luanne Bras, MD;  Location: [place];  Service: Radiology;  Laterality: N/A;  . TEE WITHOUT CARDIOVERSION N/A 03/17/2017   Procedure: TRANSESOPHAGEAL ECHOCARDIOGRAM (TEE);  Surgeon: Dorothy Spark, MD;  Location[facility] ENDOSCOPY;  Service: Cardiovascular;  Laterality: N/A;  . TONSILLECTOMY    . TOTAL ABDOMINAL HYSTERECTOMY W/ BILATERAL SALPINGOOPHORECTOMY      Family History  Problem Relation Age of Onset  . Healthy Mother   . Healthy Father     Social History   Tobacco Use  . Smoking status: Current Every Day Smoker    Packs/day: 0.25    Years: 35.00    Pack  years: 8.75    Types: Cigarettes    Last attempt to quit: 07/09/1999    Years since quitting: 17.9  . Smokeless tobacco: Never Used  Substance Use Topics  . Alcohol use: No    Alcohol/week: 0.0 oz    Comment: socially    Subjective:  Patient is accompanied by her RN/ caregiver today; had been having problems with diarrhea in the past week- feels that those symptoms have resolved as of today; started with vertigo this am- has had in the past; feels like she is on a "merry go round." is experiencing blurred vision/ per caregiver, had overall "body shaking" on the way to office; per caregiver, they symptoms looked like patient was having a seizure but patient did not lose consciousness; feels like symptoms are worsening-  progressing into headache; patient states she is having a severe headache/ worsening vision in her right eye;   Objective:  Vitals:   06/10/17 1314  BP: 138/80  Pulse: (!) 101  Temp: 98.5 F (36.9 C)  TempSrc: Oral  SpO2: 95%  Height: 5\' 3"  (1.6 m)    General: Well developed, well nourished, in mild distress  Skin : Warm and dry.  Head: Normocephalic and atraumatic  Eyes: left eye is covered; right eye visibly twitches in office Ears: External normal; canals clear; tympanic membranes normal  Oropharynx: Pink, supple. No suspicious lesions  Neck: Supple without thyromegaly, adenopathy  Lungs: Respirations unlabored; clear to auscultation bilaterally without wheeze, rales, rhonchi  Neurologic: Exhibits some confusion in office; witnessed left sided body tremor in office;  Assessment:  1. Dizziness   2. Vision changes   3. Witnessed seizure-like activity (HCC)   4. Other headache syndrome     Plan:  Refer to ER for further evaluation; follow-up to be determined.   No follow-ups on file.  No orders of the defined types were placed in this encounter.   Requested Prescriptions    No prescriptions requested or ordered in this encounter

## 2017-06-11 ENCOUNTER — Ambulatory Visit: Payer: Self-pay | Admitting: Nurse Practitioner

## 2017-06-11 ENCOUNTER — Other Ambulatory Visit: Payer: Self-pay

## 2017-06-11 ENCOUNTER — Encounter (HOSPITAL_COMMUNITY): Payer: Self-pay | Admitting: Student

## 2017-06-11 DIAGNOSIS — S22030D Wedge compression fracture of third thoracic vertebra, subsequent encounter for fracture with routine healing: Secondary | ICD-10-CM | POA: Diagnosis not present

## 2017-06-11 DIAGNOSIS — R531 Weakness: Principal | ICD-10-CM

## 2017-06-11 DIAGNOSIS — G8929 Other chronic pain: Secondary | ICD-10-CM | POA: Diagnosis not present

## 2017-06-11 DIAGNOSIS — S22040D Wedge compression fracture of fourth thoracic vertebra, subsequent encounter for fracture with routine healing: Secondary | ICD-10-CM | POA: Diagnosis not present

## 2017-06-11 DIAGNOSIS — F319 Bipolar disorder, unspecified: Secondary | ICD-10-CM | POA: Diagnosis not present

## 2017-06-11 DIAGNOSIS — E039 Hypothyroidism, unspecified: Secondary | ICD-10-CM | POA: Diagnosis not present

## 2017-06-11 DIAGNOSIS — I671 Cerebral aneurysm, nonruptured: Secondary | ICD-10-CM | POA: Diagnosis not present

## 2017-06-11 LAB — BASIC METABOLIC PANEL
Anion gap: 9 (ref 5–15)
BUN: 10 mg/dL (ref 6–20)
CHLORIDE: 110 mmol/L (ref 101–111)
CO2: 22 mmol/L (ref 22–32)
CREATININE: 0.73 mg/dL (ref 0.44–1.00)
Calcium: 8.9 mg/dL (ref 8.9–10.3)
GFR calc Af Amer: >60 mL/min (ref >60)
GFR calc non Af Amer: >60 mL/min (ref >60)
Glucose, Bld: 124 mg/dL — ABNORMAL HIGH (ref 65–99)
Potassium: 3.5 mmol/L (ref 3.5–5.1)
SODIUM: 141 mmol/L (ref 135–145)

## 2017-06-11 LAB — CBC
HCT: 34.9 % — ABNORMAL LOW (ref 36.0–46.0)
HEMOGLOBIN: 11 g/dL — AB (ref 12.0–15.0)
MCH: 28.3 pg (ref 26.0–34.0)
MCHC: 31.5 g/dL (ref 30.0–36.0)
MCV: 89.7 fL (ref 78.0–100.0)
Platelets: 270 10*3/uL (ref 150–400)
RBC: 3.89 MIL/uL (ref 3.87–5.11)
RDW: 15.4 % (ref 11.5–15.5)
WBC: 7.3 10*3/uL (ref 4.0–10.5)

## 2017-06-11 LAB — TSH: TSH: 0.05 u[IU]/mL — AB (ref 0.350–4.500)

## 2017-06-11 LAB — MAGNESIUM: MAGNESIUM: 1.6 mg/dL — AB (ref 1.7–2.4)

## 2017-06-11 LAB — MRSA PCR SCREENING: MRSA BY PCR: NEGATIVE

## 2017-06-11 MED ORDER — ACETAMINOPHEN 325 MG PO TABS: 650.0000 mg | ORAL_TABLET | Freq: Four times a day (QID) | ORAL | Status: DC | PRN

## 2017-06-11 MED ORDER — ONDANSETRON HCL 4 MG PO TABS: 4.0000 mg | ORAL_TABLET | Freq: Four times a day (QID) | ORAL | Status: DC | PRN

## 2017-06-11 MED ORDER — ACETAMINOPHEN 650 MG RE SUPP: 650.0000 mg | Freq: Four times a day (QID) | RECTAL | Status: DC | PRN

## 2017-06-11 MED ORDER — MAGNESIUM SULFATE 2 GM/50ML IV SOLN
2.0000 g | Freq: Once | INTRAVENOUS | Status: AC
  Administered 2017-06-12: 2 g via INTRAVENOUS
  Filled 2017-06-11 (×2): qty 50

## 2017-06-11 MED ORDER — OXYCODONE HCL 5 MG PO TABS
10.0000 mg | ORAL_TABLET | Freq: Four times a day (QID) | ORAL | Status: DC | PRN
  Administered 2017-06-11 – 2017-06-12 (×4): 10 mg via ORAL
  Filled 2017-06-11 (×4): qty 2

## 2017-06-11 MED ORDER — LEVOTHYROXINE SODIUM 75 MCG PO TABS
150.0000 ug | ORAL_TABLET | Freq: Every day | ORAL | Status: DC
  Filled 2017-06-11: qty 2

## 2017-06-11 MED ORDER — LINACLOTIDE 145 MCG PO CAPS
145.0000 ug | ORAL_CAPSULE | Freq: Every day | ORAL | Status: DC
  Administered 2017-06-12: 145 ug via ORAL
  Filled 2017-06-11 (×2): qty 1

## 2017-06-11 MED ORDER — GADOBENATE DIMEGLUMINE 529 MG/ML IV SOLN
18.0000 mL | Freq: Once | INTRAVENOUS | Status: AC | PRN
  Administered 2017-06-11: 18 mL via INTRAVENOUS

## 2017-06-11 MED ORDER — ATORVASTATIN CALCIUM 40 MG PO TABS
40.0000 mg | ORAL_TABLET | Freq: Every evening | ORAL | Status: DC
  Administered 2017-06-11: 40 mg via ORAL
  Filled 2017-06-11 (×2): qty 1

## 2017-06-11 MED ORDER — IPRATROPIUM-ALBUTEROL 0.5-2.5 (3) MG/3ML IN SOLN: 3.0000 mL | Freq: Four times a day (QID) | RESPIRATORY_TRACT | Status: DC | PRN

## 2017-06-11 MED ORDER — ZOLPIDEM TARTRATE 5 MG PO TABS
5.0000 mg | ORAL_TABLET | Freq: Every day | ORAL | Status: DC
  Administered 2017-06-11 – 2017-06-12 (×2): 5 mg via ORAL
  Filled 2017-06-11 (×3): qty 1

## 2017-06-11 MED ORDER — DIPHENHYDRAMINE HCL 25 MG PO CAPS: 25.0000 mg | ORAL_CAPSULE | Freq: Four times a day (QID) | ORAL | Status: DC | PRN

## 2017-06-11 MED ORDER — OLANZAPINE 10 MG PO TABS
20.0000 mg | ORAL_TABLET | Freq: Every day | ORAL | Status: DC
  Administered 2017-06-11: 20 mg via ORAL
  Filled 2017-06-11: qty 2

## 2017-06-11 MED ORDER — GABAPENTIN 100 MG PO CAPS
100.0000 mg | ORAL_CAPSULE | Freq: Three times a day (TID) | ORAL | Status: DC
  Administered 2017-06-11 – 2017-06-12 (×4): 100 mg via ORAL
  Filled 2017-06-11 (×4): qty 1

## 2017-06-11 MED ORDER — ONDANSETRON HCL 4 MG/2ML IJ SOLN: 4.0000 mg | Freq: Four times a day (QID) | INTRAMUSCULAR | Status: DC | PRN

## 2017-06-11 MED ORDER — CLONAZEPAM 0.5 MG PO TABS
0.5000 mg | ORAL_TABLET | Freq: Two times a day (BID) | ORAL | Status: DC
  Administered 2017-06-11 – 2017-06-12 (×4): 0.5 mg via ORAL
  Filled 2017-06-11 (×4): qty 1

## 2017-06-11 MED ORDER — ENOXAPARIN SODIUM 40 MG/0.4ML ~~LOC~~ SOLN
40.0000 mg | Freq: Every day | SUBCUTANEOUS | Status: DC
  Administered 2017-06-11 – 2017-06-12 (×2): 40 mg via SUBCUTANEOUS
  Filled 2017-06-11 (×3): qty 0.4

## 2017-06-11 MED ORDER — ASPIRIN 325 MG PO TABS
325.0000 mg | ORAL_TABLET | Freq: Every day | ORAL | Status: DC
  Administered 2017-06-11 – 2017-06-12 (×2): 325 mg via ORAL
  Filled 2017-06-11 (×2): qty 1

## 2017-06-11 MED ORDER — LEVOTHYROXINE SODIUM 100 MCG PO TABS
100.0000 ug | ORAL_TABLET | Freq: Every day | ORAL | Status: DC
  Administered 2017-06-12: 100 ug via ORAL
  Filled 2017-06-11: qty 1

## 2017-06-11 NOTE — Progress Notes (Signed)
Pt admitted to unit via stretcher. Pt able to ambulate to bed with NAD, gait steady. Pt talked nonstop from arrival in room to time RN left the room. 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 Pt educated that would be contrary due to her c/o blindness. Pt stated "Well I can see with my glasses on".  Pt also asked if she would allow Rn to place an IV. Pt stated "No I would not. I don't want one".

## 2017-06-11 NOTE — Evaluation (Signed)
Physical Therapy Evaluation Patient Details Name: Kelsey Hanna MRN: [id number] DOB: 1951-02-10 Today's Date: 06/11/2017   History of Present Illness  Pt is a 67 y/o female admitted secondary to weakness and seizure like activity. Pt also presenting with dizziness upon eval. MRI of T spine revealed old T3-5 compression fx. MRI of L spine negative for acute abnormality and MRI of head negative for acute abnormality. PMH includes HTN, OSA, dCHF, DM, a fib, ICA aneurysm, and bipolar disorder. 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   Clinical Impression  Pt admitted secondary to problem above with deficits below. Pt limited in gait tolerance this session secondary to dizziness. Required min guard A and use of RW for steadying throughout. Pt concerned about falling at home, and feel pt is at an increased risk for falls given symptoms. Pt also lives alone, with a PCA that comes 5 days/week for 2 hours. Feel pt would benefit from short term SNF prior to return home given current deficits. Will continue to follow acutely to maximize functional mobility independence and safety.     Follow Up Recommendations SNF    Equipment Recommendations  Rolling walker with 5" wheels    Recommendations for Other Services OT consult     Precautions / Restrictions Precautions Precautions: Fall Restrictions Weight Bearing Restrictions: No      Mobility  Bed Mobility Overal bed mobility: Needs Assistance Bed Mobility: Supine to Sit;Sit to Supine     Supine to sit: Min guard Sit to supine: Min guard   General bed mobility comments: Min guard for safety.   Transfers Overall transfer level: Needs assistance Equipment used: Rolling walker (2 wheeled) Transfers: Sit to/from Stand Sit to Stand: Min guard         General transfer comment: Min guard for steadying assist.   Ambulation/Gait Ambulation/Gait assistance: Min guard Ambulation Distance (Feet): 40 Feet Assistive device: Rolling walker (2  wheeled) Gait Pattern/deviations: Step-through pattern;Decreased stride length Gait velocity: Decreased  Gait velocity interpretation: <1.31 ft/sec, indicative of household ambulator General Gait Details: Pt ambulating to [facility], however, reporting increased dizziness and required seated rest. After toileting able to ambulate short distance, however, had to return to room as pt with increased dizziness. Symptoms seemed to improve with seated rest. Min guard for steadying assist throughout.   Stairs            Wheelchair Mobility    Modified Rankin (Stroke Patients Only)       Balance Overall balance assessment: Needs assistance Sitting-balance support: No upper extremity supported;Feet supported Sitting balance-Leahy Scale: Fair     Standing balance support: Bilateral upper extremity supported;During functional activity Standing balance-Leahy Scale: Poor Standing balance comment: Reliant on BUE support.                              Pertinent Vitals/Pain Pain Assessment: No/denies pain    Home Living Family/patient expects to be discharged to:: Private residence Living Arrangements: Alone Available Help at Discharge: Personal care attendant(2 hours a day/5 X/wk ) Type of Home: Apartment Home Access: Level entry     Home Layout: One level Home Equipment: None      Prior Function Level of Independence: Needs assistance   Gait / Transfers Assistance Needed: Reports she does not use anything to walk.   ADL's / Homemaking Assistance Needed: assisted for some meal prep and housekeeping        Hand Dominance   Dominant Hand: Right  Extremity/Trunk Assessment   Upper Extremity Assessment Upper Extremity Assessment: Defer to OT evaluation    Lower Extremity Assessment Lower Extremity Assessment: Generalized weakness    Cervical / Trunk Assessment Cervical / Trunk Assessment: Kyphotic  Communication   Communication: No difficulties  Cognition  Arousal/Alertness: Awake/alert Behavior During Therapy: WFL for tasks assessed/performed Overall Cognitive Status: No family/caregiver present to determine baseline cognitive functioning                                 General Comments: Pt with short term memory deficits noted. Also loosing train of thought mid sentence.       General Comments General comments (skin integrity, edema, etc.): Pt concerned about going home given deficits and decreased support system. Educated about short term SNF. Pt asking about how long she would have to stay, and PT educated that we could not give a specific time frame and would be dependent on her progress.     Exercises     Assessment/Plan    PT Assessment Patient needs continued PT services  PT Problem List Decreased activity tolerance;Decreased balance;Decreased strength;Decreased cognition;Decreased knowledge of use of DME;Decreased knowledge of precautions       PT Treatment Interventions DME instruction;Gait training;Functional mobility training;Therapeutic activities;Therapeutic exercise;Neuromuscular re-education;Balance training;Cognitive remediation;Patient/family education    PT Goals (Current goals can be found in the Care Plan section)  Acute Rehab PT Goals Patient Stated Goal: to get better  PT Goal Formulation: With patient Time For Goal Achievement: 06/25/17 Potential to Achieve Goals: Good    Frequency Min 2X/week   Barriers to discharge Decreased caregiver support Lives alone with PCA that checks on her 2 hours/5 days/week    Co-evaluation               AM-PAC PT "6 Clicks" Daily Activity  Outcome Measure Difficulty turning over in bed (including adjusting bedclothes, sheets and blankets)?: A Little Difficulty moving from lying on back to sitting on the side of the bed? : Unable Difficulty sitting down on and standing up from a chair with arms (e.g., wheelchair, bedside commode, etc,.)?: Unable Help  needed moving to and from a bed to chair (including a wheelchair)?: A Little Help needed walking in hospital room?: A Little Help needed climbing 3-5 steps with a railing? : A Lot 6 Click Score: 13    End of Session Equipment Utilized During Treatment: Gait belt Activity Tolerance: Treatment limited secondary to medical complications (Comment)(dizziness ) Patient left: in bed;with call bell/phone within reach Nurse Communication: Mobility status PT Visit Diagnosis: Unsteadiness on feet (R26.81);Other abnormalities of gait and mobility (R26.89)    Time: 1100-1127 PT Time Calculation (min) (ACUTE ONLY): 27 min   Charges:   PT Evaluation $PT Eval Moderate Complexity: 1 Mod PT Treatments $Gait Training: 8-22 mins   PT G Codes:        Leighton Ruff, PT, DPT  Acute Rehabilitation Services  Pager: [phone]   Rudean Hitt 06/11/2017, 11:44 AM

## 2017-06-11 NOTE — Progress Notes (Addendum)
Subjective: Main complaint is that she has a lot of left ankle swelling and bruising. Initially she was talking about her aneurysm and when asked about her LE weakness she took a little while to think but then remembered she had weakness. She states it is much better now.   Exam: Vitals:   06/11/17 0805 06/11/17 1039  BP:  (!) 145/70  Pulse:    Resp:    Temp:    SpO2: 96%     Physical Exam   HEENT-  Normocephalic, no lesions, without obvious abnormality.  Normal right external eye and conjunctiva. Patch over left eye  Lungs-no rhonchi or wheezing noted, no excessive working breathing.  Saturations within normal limits Abdomen- All 4 quadrants palpated and nontender Extremities- Warm, dry with LE edema     Neuro:  Mental Status: Alert, oriented, thought content appropriate.  Speech fluent without evidence of aphasia.  Able to follow 3 step commands without difficulty. Cranial Nerves: II: Patch to left eye secondary to remote trauma per patient. Constricted visual fields OD all 4 quadrants.  Right pupil reactive.  III,IV, VI: Ptosis not present, EOMI.   V,VII: Smile symmetric, facial temp sensation decreased on the left VIII: hearing intact to voice IX,X: No hypophonia XI: Symmetric XII: midline tongue extension Motor: RUE 5/5 LUE  5/5 RLE: 5/5 LLE: intermittent give way but when distracted showed 5/5  Sensory: no significant temperature changes today Deep Tendon Reflexes: 2+ and symmetric throughout UE and KJ with 1+ in right AJ would not let me touch her left Plantars: Right: downgoing   Left: downgoing Cerebellar: normal finger-to-nose,    Medications:  Scheduled: . aspirin  325 mg Oral Daily  . atorvastatin  40 mg Oral QPM  . clonazePAM  0.5 mg Oral BID  . enoxaparin (LOVENOX) injection  40 mg Subcutaneous Daily  . gabapentin  100 mg Oral TID  . [START ON 06/12/2017] levothyroxine  100 mcg Oral QAC breakfast  . linaclotide  145 mcg Oral QAC breakfast  .  OLANZapine  20 mg Oral QHS  . zolpidem  5 mg Oral QHS    Pertinent Labs/Diagnostics:   Mr Brain Wo Contrast  Result Date: 06/10/2017 CLINICAL DATA:  67 y/o F; brief episode of tremors with dizziness and sensitivity to light. EXAM: MRI HEAD WITHOUT CONTRAST TECHNIQUE: Multiplanar, multiecho pulse sequences of the brain and surrounding structures were obtained without intravenous contrast. COMPARISON:  05/02/2017 MRI of the head. FINDINGS: Brain: No acute infarction, hemorrhage, hydrocephalus, extra-axial collection or mass lesion. Cavum septum pellucidum. Few stablenonspecific foci of T2 FLAIR hyperintense signal abnormality in subcortical and periventricular white matter are compatible with chronic microvascular ischemic changes for age. Stablebrain parenchymal volume loss. Stable punctate focus of susceptibility hypointensity within the right frontal lobe compatible with hemosiderin deposition of chronic microhemorrhage. Vascular: Normal flow voids. Skull and upper cervical spine: Normal marrow signal. Sinuses/Orbits: Negative. Other: Bilateral intra-ocular lens replacement. IMPRESSION: 1. No acute intracranial abnormality identified. 2. Stable chronic microvascular ischemic changes and parenchymal volume loss of the brain. Electronically Signed   By: Kristine Garbe M.D.   On: 06/10/2017 19:40   Mr Lumbar Spine Wo Contrast  Result Date: 06/10/2017 . IMPRESSION: 1. No acute abnormality within the lumbar spine. 2. Left extraforaminal disc protrusion at L3-4, displacing the exiting left L3 nerve root as it courses out of the neural foramen. 3. Postoperative changes from prior left laminectomy with micro discectomy at L4-5. Residual small central/left subarticular disc protrusion with associated left lateral recess  stenosis is stable from previous. 4. Mild to moderate left L4 and bilateral L5 foraminal narrowing related to disc bulge and facet hypertrophy. Electronically Signed   By: Jeannine Boga M.D.   On: 06/10/2017 20:02   Mr Thoracic Spine W Wo Contrast  Result Date: 06/11/2017  IMPRESSION: 1. Old mild-to-moderate T3 and T4 compression fractures, minimal old T5 compression fracture without acute osseous process. 2. Congenitally capacious thoracic spinal canal without canal stenosis or neural foraminal narrowing. Electronically Signed   By: Elon Alas M.D.   On: 06/11/2017 00:43       Impression:  67 year old female presenting with stated complaint of bilateral lower extremity paralysis, evolving to stated LLE paralysis with intact RLE. Much improved today with 5/5 strength in bilateral LE. Still has give way strength in the left LE. Again neurological exam has multiple functional components. No definite lateralizing findings seen.      Recommendations: 1) F/U PT/OT recommendations  --Neurology will S/O   Etta Quill PA-C [facility] [phone]   06/11/2017, 4:37 PM    NEUROHOSPITALIST ADDENDUM I have discussed the case with PAC. Case seen by Dr Cheral Marker thought to be effort dependent and plan was to follow up on MRI T spine. It appears patient has also clinically improved with minimal weakness.   I have reviewed the contents of history and physical exam as documented by PA/ARNP/Resident and agree with above documentation. I have reviewed the MRI Brain, MR T spine and L spine.     Karena Addison Oveda Dadamo MD [facility] [phone]   If 7pm to 7am, please call on call as listed on AMION.

## 2017-06-11 NOTE — ED Notes (Signed)
Heart Healthy diet lunch tray ordered.  

## 2017-06-11 NOTE — Plan of Care (Signed)
Educated on fall risk and to call don't fall. Pt verbalizes understanding.

## 2017-06-11 NOTE — Progress Notes (Addendum)
Patient is a 67 year old female with past medical history significant for hypertension, hypothyroidism, obstructive sleep apnea, diastolic CHF, diabetes type 2, paroxysmal A. fib, bilateral ICA aneurysm who presents to the emergency department with complaints of seizure like activity.  She presented with jerky movements of her limbs.  She also had multiple other associated symptoms including right-sided headache, neck pain, left ankle pain, multiple other joint pain, myalgia, nausea, vomiting and dizziness.  Patient ambulates with the help of rolling walker and has home health aides She also has history of chronic pain syndrome and follows with  pain management clinic. Patient underwent MRI of the brain and  lumbar/thoracic spine without any acute changes.  Neurology was consulted on presentation. Patient had previous admissions for generalized lower extremity weakness, gait ataxia. Patient's symptoms look to have improved this morning.  She was seen and examined at bedside this morning.  She says she feels fine now. She has a history of a stroke and has left-sided residual weakness.  Power is 4.5 left side.  Complains of back pain. She was evaluated by the physical therapy today and has been recommended skilled nursing facility on discharge.  Following problems are being addressed:  Tremor/weakness: Much improved this morning.  Brain imaging and spine imagings did not show any acute abnormalities.   MRI report: Left extraforaminal disc protrusion at L3-4, displacing the exiting left L3 nerve root as it courses out of the neural foramen. Postoperative changes from prior left laminectomy with micro discectomy at L4-5. Residual small central/left subarticular disc protrusion with associated left lateral recess stenosis is stable from previous. Mild to moderate left L4 and bilateral L5 foraminal narrowing related to disc bulge and facet hypertrophy. Old mild-to-moderate T3 and T4 compression  fractures, minimal old T5 compression fracture without acute osseous process.   Neurology is following.  Physical therapy recommended skilled nursing facility on discharge.SW consulted.  History of CVA: With left-sided residual weakness.  Continue aspirin,plavix and Lipitor. She has a history of right paraophthalmic aneurysm status post embolization.  Hypothyroidism:  TSH found to be low.She takes  Levothyroxine 150 mcg at home.  We will decrease the dose to 100.Check TSH in  3 months.  Bipolar disorder: Continue olanzapine Patient should follow-up with her psychiatrist on discharge.  Chronic pain: Patient reports being out of pain medications until follow-up with pain management clinic on May 1 Continue home pain management regimen.  Complains of back pain.  H/O bilateral carotid artery aneurysms:Patient status post repair of right internal carotid artery aneurysm. Left carotid artery aneurysm needs repair.  Follow-up with vascular surgery as an outpatient  Hyperlipidemia Continue atorvastatin

## 2017-06-11 NOTE — Progress Notes (Signed)
Provided forms for patient.  Chaplain look over both AD and Mental Health AD should be ready to sign.  She is legally blind and asked for help completing the forms.  She would like the Chaplain come not before 10am to complete signing things.  Please glance over mental health document due to so many questions it has.  Her son and daughter are listed as the Healthcare power of Attorney people and will be also on the [employer].   SHE CLEARLY SAID SHE WANTS TO BE DO NOT RECESSITATE SHE CLEARLY SAID Mental Health Treatment is fine, She has BIPOLAR  however NO SHOCK TREATMENTS-as listed in document.  Conard Novak, Chaplain   06/11/17 1900  Clinical Encounter Type  Visited With Patient;Family  Visit Type Initial;Other (Comment) (AD forms for AD and Mental Health POA)  Referral From Physician  Consult/Referral To Chaplain  Spiritual Encounters  Spiritual Needs Literature  Stress Factors  Patient Stress Factors None identified  Family Stress Factors None identified

## 2017-06-11 NOTE — ED Notes (Signed)
Pt called main ER desk and requested her morning meds and her pain meds.

## 2017-06-11 NOTE — H&P (Addendum)
History and Physical    Kelsey Hanna IOE:[id number] DOB: 12-23-1950 DOA: 06/10/2017  Referring MD/NP/PA: Arlean Hopping, PA-C PCP: [name], [place]  Patient coming from: From PCP office  Chief Complaint: Tremors  I have personally briefly reviewed patient's old medical records in [place]   HPI: Kelsey Hanna is a 67 y.o. female with medical history significant of HTN, hypothyroidism, OSA, diastolic CHF, DM type II, PAF, bilateral ICA aneurysm; who presents with complaints of having seizure-like activity.  Describes this as jerking of her limbs where patient is fully alert.  Patient reports jerking/tremors until she has to lay down and feels weak especially in her legs.  Associated symptoms include complaints of right-sided headache, neck pain, left ankle pain, multiple other joint pain, myalgias, nausea, vomiting, and dizziness.  At baseline patient reports ambulating with a rolling walker and home health aides, but reports needing a regular walker without wheels to get around her home.  Patient reports that she does not have any pain medications at home and does not have a follow-up appointment at the pain management clinic until May 1.    ED Course: Upon admission into the emergency department patient was seen to be afebrile, pulse 83-101, respirations 16-28, and all other vital signs relatively within normal limits. Initial lab workup was relatively unremarkable. UA was negative for any acute abnormalities as well as urine drug screen.  Neurology was initially consulted due to patient's complaints and reports of lower extremity weakness.  MRI of the brain showed no acute abnormalities.  Subsequently, patient had MRI of the lumbar spine and then of the thoracic spine per neurology recommendations.  MRI of the lumbar spine showed  Review of Systems  Constitutional: Positive for malaise/fatigue. Negative for chills.  Gastrointestinal: Positive for abdominal pain, nausea  and vomiting.  Musculoskeletal: Positive for joint pain and myalgias.  Neurological: Positive for tremors and weakness. Negative for loss of consciousness.    Past Medical History:  Diagnosis Date  . Anxiety   . Bipolar 1 disorder ([place])   . Cancer [facility])    cancer of uterus...no chemo or radiation  . Chicken pox   . Depression   . Diabetes mellitus    dx within last yr....just takes pills  . Dyslipidemia   . Hypothyroid   . Retinal detachment   . Sleep apnea     Past Surgical History:  Procedure Laterality Date  . ABDOMINAL HYSTERECTOMY    . IR 3D INDEPENDENT WKST  03/13/2017  . IR ANGIO INTRA EXTRACRAN SEL COM CAROTID INNOMINATE UNI L MOD SED  03/13/2017  . IR ANGIO INTRA EXTRACRAN SEL INTERNAL CAROTID UNI R MOD SED  03/13/2017  . IR ANGIO VERTEBRAL SEL SUBCLAVIAN INNOMINATE UNI R MOD SED  03/13/2017  . IR ANGIOGRAM FOLLOW UP STUDY  03/13/2017  . IR CT HEAD LTD  03/13/2017  . IR RADIOLOGIST EVAL & MGMT  02/19/2017  . IR TRANSCATH/EMBOLIZ  03/13/2017  . LUMBAR LAMINECTOMY/DECOMPRESSION MICRODISCECTOMY  07/17/2011   Procedure: LUMBAR LAMINECTOMY/DECOMPRESSION MICRODISCECTOMY;  Surgeon: Sinclair Ship, MD;  Location: [facility];  Service: Orthopedics;  Laterality: Left;  Left sided lumbar 4-5 microdisectomy  . RADIOLOGY WITH ANESTHESIA N/A 03/13/2017   Procedure: EMBOLIZATION;  Surgeon: Luanne Bras, MD;  Location: [facility];  Service: Radiology;  Laterality: N/A;  . TEE WITHOUT CARDIOVERSION N/A 03/17/2017   Procedure: TRANSESOPHAGEAL ECHOCARDIOGRAM (TEE);  Surgeon: Dorothy Spark, MD;  Location: [facility] ENDOSCOPY;  Service: Cardiovascular;  Laterality: N/A;  . TONSILLECTOMY    .  TOTAL ABDOMINAL HYSTERECTOMY W/ BILATERAL SALPINGOOPHORECTOMY       reports that she has been smoking cigarettes.  She has a 8.75 pack-year smoking history. She has never used smokeless tobacco. She reports that she does not drink alcohol or use drugs.  No Known Allergies  Family History  Problem Relation  Age of Onset  . Healthy Mother   . Healthy Father     Prior to Admission medications   Medication Sig Start Date End Date Taking? Authorizing Provider  acetaminophen (TYLENOL) 500 MG tablet Take 500 mg by mouth every 6 (six) hours as needed for mild pain.   Yes [provider]  aspirin 325 MG tablet Take 1 tablet (325 mg total) by mouth daily. 04/22/17  Yes [name], FNP  atorvastatin (LIPITOR) 40 MG tablet Take 1 tablet (40 mg total) by mouth every evening. 03/04/17  Yes [name], FNP  clonazePAM (KLONOPIN) 0.5 MG tablet Take 1 tablet (0.5 mg total) by mouth 2 (two) times daily. 1 in the morning and 1 midday 03/18/17  Yes Velvet Bathe, MD  clopidogrel (PLAVIX) 75 MG tablet Take 75 mg by mouth daily.   Yes [provider]  diphenhydrAMINE (BENADRYL) 25 MG tablet Take 25 mg by mouth every 6 (six) hours as needed for allergies.   Yes [provider]  docusate sodium (DOCQLACE) 100 MG capsule TAKE 1 TO 2 CAPSULES BY MOUTH TWICE A DAY AS NEEDED FOR MILD CONSTIPATION Patient taking differently: Take 200 mg by mouth at bedtime. TAKE 1 TO 2 CAPSULES BY MOUTH TWICE A DAY AS NEEDED FOR MILD CONSTIPATION 04/22/17  Yes [name], FNP  gabapentin (NEURONTIN) 100 MG capsule Take 100 mg by mouth 3 (three) times daily.   Yes [provider]  levothyroxine (SYNTHROID, LEVOTHROID) 150 MCG tablet TAKE 1 TABLET BY MOUTH DAILY BEFORE BREAKFAST on Monday through Saturday 05/15/17  Yes [name], FNP  linaclotide [facility]) 145 MCG CAPS capsule Take 1 capsule (145 mcg total) by mouth daily before breakfast. 03/04/17  Yes [name], FNP  naproxen sodium (ALEVE) 220 MG tablet Take 220 mg by mouth 2 (two) times daily as needed (pain).   Yes [provider]  OLANZapine (ZYPREXA) 20 MG tablet Take 20 mg by mouth at bedtime.  02/11/17  Yes [provider]  Oxycodone HCl 10 MG TABS Take 1 tablet (10 mg total) by  mouth every 6 (six) hours as needed (pain). Patient taking differently: Take 10 mg by mouth 4 (four) times daily.  03/18/17  Yes Velvet Bathe, MD  zolpidem (AMBIEN) 5 MG tablet Take 1 tablet (5 mg total) by mouth at bedtime as needed for sleep. Patient taking differently: Take 10 mg by mouth at bedtime.  05/13/17  Yes [name], FNP  loratadine (CLARITIN) 10 MG tablet Take 1 tablet (10 mg total) by mouth daily. Patient not taking: Reported on 06/10/2017 04/22/17   [name], FNP    Physical Exam:  Constitutional: Obese female who appears to be in no acute distress Vitals:   06/10/17 2010 06/10/17 2030 06/10/17 2142 06/11/17 0050  BP:  (!) 157/76 114/74 121/67  Pulse:  91 88 83  Resp:  16 16 (!) 28  Temp: 98.2 F (36.8 C)     TempSrc:      SpO2:  100% 99% 95%  Weight:      Height:       Eyes: Opacification of left eye with left eyelid lag.  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 ENMT: Mucous membranes are moist. Posterior pharynx clear of any exudate or lesions.  Neck: normal, supple, no masses, no thyromegaly Respiratory: Decreased overall aeration but no significant wheezes or rhonchi appreciated. Cardiovascular: Regular rate and rhythm, no murmurs / rubs / gallops.  Trace lower extremity   edema. 2+ pedal pulses. No carotid bruits.  Abdomen: no tenderness, no masses palpated. No hepatosplenomegaly. Bowel sounds positive.  Musculoskeletal: no clubbing / cyanosis. No joint deformity upper and lower extremities. Good ROM, no contractures. Normal muscle tone.  Skin: no rashes, lesions, ulcers. No induration Neurologic: CN 2-12 grossly intact.  Patient able to move all extremities at this time. Psychiatric: Normal judgment and insight. Alert and oriented x 3. Anxious mood.     Labs on Admission: I have personally reviewed following labs and imaging studies  CBC: Recent Labs  Lab 06/10/17 1756  WBC 9.1  NEUTROABS 5.3  HGB 12.1  HCT 38.5  MCV 89.5  PLT 643    Basic Metabolic Panel: Recent Labs  Lab 06/10/17 1756  NA 144  K 3.5  CL 111  CO2 25  GLUCOSE 85  BUN 8  CREATININE 0.76  CALCIUM 9.7   GFR: Estimated Creatinine Clearance: 69.1 mL/min (by C-G formula based on SCr of 0.76 mg/dL). Liver Function Tests: Recent Labs  Lab 06/10/17 1756  AST 31  ALT 25  ALKPHOS 63  BILITOT 0.7  PROT 6.8  ALBUMIN 4.0   No results for input(s): LIPASE, AMYLASE in the last 168 hours. No results for input(s): AMMONIA in the last 168 hours. Coagulation Profile: No results for input(s): INR, PROTIME in the last 168 hours. Cardiac Enzymes: No results for input(s): CKTOTAL, CKMB, CKMBINDEX, TROPONINI in the last 168 hours. BNP (last 3 results) No results for input(s): PROBNP in the last 8760 hours. HbA1C: No results for input(s): HGBA1C in the last 72 hours. CBG: No results for input(s): GLUCAP in the last 168 hours. Lipid Profile: No results for input(s): CHOL, HDL, LDLCALC, TRIG, CHOLHDL, LDLDIRECT in the last 72 hours. Thyroid Function Tests: No results for input(s): TSH, T4TOTAL, FREET4, T3FREE, THYROIDAB in the last 72 hours. Anemia Panel: No results for input(s): VITAMINB12, FOLATE, FERRITIN, TIBC, IRON, RETICCTPCT in the last 72 hours. Urine analysis:    Component Value Date/Time   COLORURINE STRAW (A) 06/10/2017 2015   APPEARANCEUR CLEAR 06/10/2017 2015   LABSPEC 1.006 06/10/2017 2015   PHURINE 5.0 06/10/2017 2015   GLUCOSEU NEGATIVE 06/10/2017 2015   HGBUR NEGATIVE 06/10/2017 2015   BILIRUBINUR NEGATIVE 06/10/2017 2015   KETONESUR NEGATIVE 06/10/2017 2015   PROTEINUR NEGATIVE 06/10/2017 2015   UROBILINOGEN 0.2 10/20/2014 0814   NITRITE NEGATIVE 06/10/2017 2015   LEUKOCYTESUR NEGATIVE 06/10/2017 2015   Sepsis Labs: No results found for this or any previous visit (from the past 240 hour(s)).   Radiological Exams on Admission: Mr Brain Wo Contrast  Result Date: 06/10/2017 CLINICAL DATA:  67 y/o F; brief episode of  tremors with dizziness and sensitivity to light. EXAM: MRI HEAD WITHOUT CONTRAST TECHNIQUE: Multiplanar, multiecho pulse sequences of the brain and surrounding structures were obtained without intravenous contrast. COMPARISON:  05/02/2017 MRI of the head. FINDINGS: Brain: No acute infarction, hemorrhage, hydrocephalus, extra-axial collection or mass lesion. Cavum septum pellucidum. Few stablenonspecific foci of T2 FLAIR hyperintense signal abnormality in subcortical and periventricular white matter are compatible with chronic microvascular ischemic changes for age. Stablebrain parenchymal volume loss. Stable punctate focus of susceptibility hypointensity within the right frontal  lobe compatible with hemosiderin deposition of chronic microhemorrhage. Vascular: Normal flow voids. Skull and upper cervical spine: Normal marrow signal. Sinuses/Orbits: Negative. Other: Bilateral intra-ocular lens replacement. IMPRESSION: 1. No acute intracranial abnormality identified. 2. Stable chronic microvascular ischemic changes and parenchymal volume loss of the brain. Electronically Signed   By: Kristine Garbe M.D.   On: 06/10/2017 19:40   Mr Thoracic Spine W Contrast  Result Date: 06/11/2017 CLINICAL DATA:  Bilateral lower extremity weakness. History of RIGHT ICA aneurysm, diabetes and lumbar spine surgery. EXAM: MRI THORACIC SPINE WITH CONTRAST TECHNIQUE: Multiplanar, multisequence MR imaging of the thoracic spine was performed following the administration of intravenous contrast. COMPARISON:  MRI lumbar spine June 10, 2017 and MRI of the head June 10, 2017 and CT chest May 03, 2017. FINDINGS: ALIGNMENT: Maintenance of the thoracic kyphosis. No malalignment. VERTEBRAE/DISCS: Old mild-to-moderate T3 and T4 compression fractures with approximately 30% superior endplate height loss. Minimal old T5 superior endplate compression fracture. The remaining thoracic vertebral bodies intact. Intervertebral disc morphology  and signal maintained. No abnormal or acute bone marrow signal. Congenitally capacious thoracic spinal canal. Degenerative change of the included cervical spine. Multiple old LEFT rib fractures. CORD: Thoracic spinal cord is normal morphology and signal characteristics to the level of the conus medullaris which terminates at below T12-L1. PREVERTEBRAL AND PARASPINAL SOFT TISSUES:  Nonacute. DISC LEVELS: No disc bulge, canal stenosis or neural foraminal narrowing at any level. IMPRESSION: 1. Old mild-to-moderate T3 and T4 compression fractures, minimal old T5 compression fracture without acute osseous process. 2. Congenitally capacious thoracic spinal canal without canal stenosis or neural foraminal narrowing. Electronically Signed   By: Elon Alas M.D.   On: 06/11/2017 00:43   Mr Lumbar Spine Wo Contrast  Result Date: 06/10/2017 CLINICAL DATA:  Initial evaluation for acute generalized muscle weakness. EXAM: MRI LUMBAR SPINE WITHOUT CONTRAST TECHNIQUE: Multiplanar, multisequence MR imaging of the lumbar spine was performed. No intravenous contrast was administered. COMPARISON:  Prior MRI from 07/28/2013. FINDINGS: Segmentation: Normal segmentation. Lowest well-formed disc labeled the L5-S1 level. Alignment: Vertebral bodies normally aligned with preservation of the normal lumbar lordosis. No listhesis. Vertebrae: Vertebral body heights maintained without evidence for acute or interval fracture. Scattered chronic endplate Schmorl's nodes present within the lower lumbar spine. Bone marrow signal intensity within normal limits. No discrete or worrisome osseous lesions. Reactive endplate changes present about the L3-4 through L5-S1 interspaces. Conus medullaris and cauda equina: Conus extends to the L1 level. Conus and cauda equina appear normal. Paraspinal and other soft tissues: Remote postsurgical changes present within the lower paraspinous soft tissues. No acute soft tissue abnormality. Visualized  visceral structures are normal. Disc levels: L1-2: Mild diffuse disc bulge. No significant canal or foraminal stenosis. L2-3: Mild diffuse disc bulge. Mild facet and ligament flavum hypertrophy. No canal or neural foraminal stenosis. L3-4: Diffuse disc bulge with intervertebral disc space narrowing. Superimposed left extraforaminal disc protrusion contacts and displaces laterally the exiting left L3 nerve root. Mild facet and ligament flavum hypertrophy. Resultant mild spinal stenosis. Foramina remain patent. L4-5: Diffuse disc bulge with intervertebral disc space narrowing. Disc bulging slightly eccentric to the left. Associated chronic reactive endplate changes with marginal endplate osteophytic spurring. Patient status post left laminectomy with micro discectomy. Residual central/left subarticular disc protrusion extending into the left lateral recess with secondary moderate left lateral recess narrowing, similar to previous. Central canal remains patent. Mild to moderate left L4 foraminal stenosis. No significant right foraminal narrowing. L5-S1: Chronic intervertebral disc space narrowing with reactive endplate  changes. Broad posterior disc osteophyte mildly flattens the ventral thecal sac. Prior bilateral laminectomies. No new or recurrent disc herniation. No central canal stenosis. Mild left-sided facet hypertrophy. Mild to moderate bilateral foraminal narrowing is unchanged. IMPRESSION: 1. No acute abnormality within the lumbar spine. 2. Left extraforaminal disc protrusion at L3-4, displacing the exiting left L3 nerve root as it courses out of the neural foramen. 3. Postoperative changes from prior left laminectomy with micro discectomy at L4-5. Residual small central/left subarticular disc protrusion with associated left lateral recess stenosis is stable from previous. 4. Mild to moderate left L4 and bilateral L5 foraminal narrowing related to disc bulge and facet hypertrophy. Electronically Signed   By:  Jeannine Boga M.D.   On: 06/10/2017 20:02    EKG: Independently reviewed.  Sinus rhythm at 90 bpm  Assessment/Plan Tremor, weakness, history of CVA: Acute.  Patient with previous admissions for generalized lower extremity weakness and gait ataxia.  MRI of the brain, thoracic, and lumbar spine revealed some bulging disks and spinal stenosis.  No otherwise clear cause for patient's symptoms.  Has had previous admissions for gait ataxia and at baseline she uses a rolling walker. - Admit to a telemetry - Neurochecks - Check magnesium - Continue aspirin - Physical  therapy to eval and treat - Will need to investigate why Plavix was reportedly discontinued. - Care management consult for equipment - Appreciate neurology recommendations  Hypothyroidism: Previous TSH noted to be 0.1 on 3/27 - Recheck TSH. - Continue levothyroxine  Bipolar disorder - Continue olanzapine - Patient may warrant psych eval  Chronic pain: Patient reports being out of pain medications until follow-up with pain management clinic on May 1 - Continue home pain management regimen  H/O bilateral carotid artery aneurysms: Patient status post repair of right internal carotid artery aneurysm.  Left carotid artery aneurysm needs repair.  Hyperlipidemia - Continue atorvastatin   DVT prophylaxis: Lovenox Code Status: DNR Family Communication: none Disposition Plan: Likely discharge home Consults called: Neurology Admission status: Observation  Norval Morton MD [facility] Pager [phone]   If 7PM-7AM, please contact night-coverage [email or web address] Password [email or web address]  06/11/2017, 2:07 AM

## 2017-06-11 NOTE — Progress Notes (Signed)
IV team at bedside to assess for IV. IV team will send a 2nd assessor back to check for optional sites. Pt currently eating dinner and denies needs.

## 2017-06-11 NOTE — ED Notes (Signed)
Pt was given a [place] sandwich graham crackers, milk, apple sauce, and diet coke per PA

## 2017-06-11 NOTE — NC FL2 (Addendum)
Friend LEVEL OF CARE SCREENING TOOL     IDENTIFICATION  Patient Name: Kelsey Hanna Birthdate: 22-Feb-1950 Sex: female Admission Date (Current Location): 06/10/2017  [facility] and [place] Number:  Herbalist and Address:  [facility] [place]. [facility], [place] [address], [place], [place] [address]      Provider Number: [id number]  Attending Physician Name and Address:  Norval Morton, MD  Relative Name and Phone Number:       Current Level of Care: Hospital Recommended Level of Care: [place] Prior Approval Number:    Date Approved/Denied:   PASRR Number: [id number]  Expires: 07/12/2017  Discharge Plan: SNF    Current Diagnoses: Patient Active Problem List   Diagnosis Date Noted  . Weakness 06/11/2017  . Chronic pain 06/11/2017  . Sepsis ([place]) 05/03/2017  . MRSA bacteremia   . Brain aneurysm 03/13/2017  . Internal carotid aneurysm   . Slurred speech 03/12/2017  . Hypotension 03/12/2017  . Dysarthria   . Left sided numbness   . Acute left-sided muscle weakness 01/24/2017  . Stroke (cerebrum) ([place]) 01/24/2017  . Intracranial aneurysm 01/24/2017  . Encounter for medication review 02/07/2016  . Hypoxia, sleep related 09/07/2015  . Constipation 07/30/2015  . Dizziness 07/30/2015  . Chronic diastolic CHF (congestive heart failure) ([place]) 06/26/2015  . Bilateral lower extremity edema 06/26/2015  . Mitral regurgitation 05/17/2015  . Accidental drug overdose 05/17/2015  . Respiratory failure with hypoxia and hypercapnia ([place]) 05/09/2015  . OSA (obstructive sleep apnea) 05/09/2015  . Overdose 05/09/2015  . Chronic back pain 03/20/2015  . Right shoulder pain 11/14/2014  . Insomnia 11/14/2014  . Psychoses ([place])   . Bacteremia   . Pedal edema 09/12/2014  . Bipolar disorder, current episode manic without psychotic features, severe ([place])   . Bipolar disorder ([place]) 09/05/2014  . Bipolar affective disorder, current  episode manic with psychotic symptoms ([place]) 09/05/2014  . Bipolar affective disorder, manic ([place]) 09/03/2014  . Encounter for preadmission testing   . Head revolving around 07/27/2014  . BP (high blood pressure) 05/23/2014  . AF (paroxysmal atrial fibrillation) ([place]) 05/23/2014  . Type 2 diabetes mellitus ([place]) 04/27/2014  . Essential hypertension 04/27/2014  . Hypothyroidism 04/27/2014  . Tobacco abuse 04/27/2014  . Bipolar I disorder ([place]) 04/20/2014  . Acute encephalopathy 04/18/2014  . Acute respiratory failure with hypoxia ([place])   . Encounter for feeding tube placement   . Shortness of breath   . Gout 08/25/2013  . 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 HLD (hyperlipidemia) 08/30/2009    Orientation RESPIRATION BLADDER Height & Weight     Self, Place  Normal Continent Weight: 83.9 kg (185 lb) Height:  5\' 3"  (160 cm)  BEHAVIORAL SYMPTOMS/MOOD NEUROLOGICAL BOWEL NUTRITION STATUS      Continent Diet(Please see DC Summary)  AMBULATORY STATUS COMMUNICATION OF NEEDS Skin   Limited Assist Verbally Normal                       Personal Care Assistance Level of Assistance  Bathing, Feeding, Dressing Bathing Assistance: Maximum assistance Feeding assistance: Limited assistance Dressing Assistance: Limited assistance     Functional Limitations Info   Sight    Impaired      SPECIAL CARE FACTORS FREQUENCY  PT (By licensed PT), OT (By licensed OT)     PT Frequency: 5x/week OT Frequency: 3x/week            Contractures  Additional Factors Info  Psychotropic, Isolation Precautions Code Status Info: DNR Allergies Info: NKA Psychotropic Info: Klonopin; Ambien   Isolation Precautions Info: MRSA positive in the nose in January     Current Medications (06/11/2017):  This is the current hospital active medication list Current Facility-Administered Medications  Medication Dose Route Frequency Provider Last Rate Last Dose  . acetaminophen (TYLENOL) tablet 650 mg   650 mg Oral Q6H PRN Fuller Plan A, MD       Or  . acetaminophen (TYLENOL) suppository 650 mg  650 mg Rectal Q6H PRN Fuller Plan A, MD      . aspirin tablet 325 mg  325 mg Oral Daily Tamala Julian, Rondell A, MD   325 mg at 06/11/17 1042  . atorvastatin (LIPITOR) tablet 40 mg  40 mg Oral QPM Smith, Rondell A, MD      . clonazePAM (KLONOPIN) tablet 0.5 mg  0.5 mg Oral BID Tamala Julian, Rondell A, MD   0.5 mg at 06/11/17 1042  . diphenhydrAMINE (BENADRYL) capsule 25 mg  25 mg Oral Q6H PRN Smith, Rondell A, MD      . enoxaparin (LOVENOX) injection 40 mg  40 mg Subcutaneous Daily Smith, Rondell A, MD      . gabapentin (NEURONTIN) capsule 100 mg  100 mg Oral TID Fuller Plan A, MD   100 mg at 06/11/17 1043  . ipratropium-albuterol (DUONEB) 0.5-2.5 (3) MG/3ML nebulizer solution 3 mL  3 mL Nebulization Q6H PRN Smith, Rondell A, MD      . levothyroxine (SYNTHROID, LEVOTHROID) tablet 150 mcg  150 mcg Oral QAC breakfast Tamala Julian, Rondell A, MD      . linaclotide (LINZESS) capsule 145 mcg  145 mcg Oral QAC breakfast Smith, Rondell A, MD      . OLANZapine (ZYPREXA) tablet 20 mg  20 mg Oral QHS Smith, Rondell A, MD      . ondansetron (ZOFRAN) tablet 4 mg  4 mg Oral Q6H PRN Fuller Plan A, MD       Or  . ondansetron (ZOFRAN) injection 4 mg  4 mg Intravenous Q6H PRN Smith, Rondell A, MD      . oxyCODONE (Oxy IR/ROXICODONE) immediate release tablet 10 mg  10 mg Oral Q6H PRN Fuller Plan A, MD   10 mg at 06/11/17 1041  . zolpidem (AMBIEN) tablet 5 mg  5 mg Oral QHS Fuller Plan A, MD   5 mg at 06/11/17 0414     Discharge Medications: Please see discharge summary for a list of discharge medications.  Relevant Imaging Results:  Relevant Lab Results:   Additional Information SS#: [id number]  Benard Halsted, LCSWA

## 2017-06-11 NOTE — Clinical Social Work Note (Signed)
Clinical Social Work Assessment  Patient Details  Name: Kelsey Hanna MRN: [id number] Date of Birth: 14-May-1950  Date of referral:  06/11/17               Reason for consult:  Facility Placement                Permission sought to share information with:  Other(none given ) Permission granted to share information::  No  Name::     none given   Agency::  none given  Relationship::  none given   Contact Information:  none given   Housing/Transportation Living arrangements for the past 2 months:  Single Family Home(alone. ) Source of Information:  Patient Patient Interpreter Needed:  None Criminal Activity/Legal Involvement Pertinent to Current Situation/Hospitalization:  No - Comment as needed Significant Relationships:  Significant Other Lives with:  Self Do you feel safe going back to the place where you live?  Yes Need for family participation in patient care:  Yes (Comment)  Care giving concerns:  CSW spoke with pt at bedside. Pt expressed being concerned with mobility and needing rehab in order to gain more independence.    Social Worker assessment / plan:  CSW spoke with pt at bedside. CSW was informed that pt is from home alone. Pt reports that pt is orgianfrom [place] but moved here awhile back. CSW was also informed that pt has a daughter but daughter is not a support for pt. Pt does express that [place] pt [place]. Pt is agreeable to SNF placement and doesn't have a set place at this time. Pt expresses beign open to any facility that is found for pt. During this assessment pt was sitting up in bed and spoke clearly to CSW.   Employment status:  Retired Nurse, adult PT Recommendations:  [place], [place] / Referral to community resources:  [facility] [facility](spoke with pt about SNF placment options in [place].)  Patient/Family's Response to  care:  Pt's response to care was understanding and pt presented no further questions to CSW at this time. CSW explained role and pt thanked CSW and asked when she would hear from [place] again. CSW expressed that pt was being admitted and would do best to keep pt up to date with placements that have been found.   Patient/Family's Understanding of and Emotional Response to Diagnosis, Current Treatment, and Prognosis:  No further questions or concerns have been presented to CSW at this time. Pt's emotional response to care was appropriate for diagnosis given at this time.   Emotional Assessment Appearance:  Appears stated age Attitude/Demeanor/Rapport:  Engaged Affect (typically observed):  Appropriate, Pleasant Orientation:  Oriented to Self, Oriented to Place, Oriented to  Time, Oriented to Situation Alcohol / Substance use:  Not Applicable Psych involvement (Current and /or in the community):  No (Comment)  Discharge Needs  Concerns to be addressed:  Care Coordination Readmission within the last 30 days:  No Current discharge risk:  Dependent with Mobility, Lack of support system Barriers to Discharge:  Continued Medical Work up   [employer], [place] 06/11/2017, 12:18 PM

## 2017-06-12 DIAGNOSIS — M549 Dorsalgia, unspecified: Secondary | ICD-10-CM | POA: Diagnosis not present

## 2017-06-12 DIAGNOSIS — E7849 Other hyperlipidemia: Secondary | ICD-10-CM | POA: Diagnosis not present

## 2017-06-12 DIAGNOSIS — G8929 Other chronic pain: Secondary | ICD-10-CM | POA: Diagnosis not present

## 2017-06-12 DIAGNOSIS — M545 Low back pain: Secondary | ICD-10-CM | POA: Diagnosis not present

## 2017-06-12 DIAGNOSIS — R278 Other lack of coordination: Secondary | ICD-10-CM | POA: Diagnosis not present

## 2017-06-12 DIAGNOSIS — I10 Essential (primary) hypertension: Secondary | ICD-10-CM | POA: Diagnosis not present

## 2017-06-12 DIAGNOSIS — Z79891 Long term (current) use of opiate analgesic: Secondary | ICD-10-CM | POA: Diagnosis not present

## 2017-06-12 DIAGNOSIS — M961 Postlaminectomy syndrome, not elsewhere classified: Secondary | ICD-10-CM | POA: Diagnosis not present

## 2017-06-12 DIAGNOSIS — Z8673 Personal history of transient ischemic attack (TIA), and cerebral infarction without residual deficits: Secondary | ICD-10-CM | POA: Diagnosis not present

## 2017-06-12 DIAGNOSIS — R531 Weakness: Secondary | ICD-10-CM | POA: Diagnosis not present

## 2017-06-12 DIAGNOSIS — I671 Cerebral aneurysm, nonruptured: Secondary | ICD-10-CM | POA: Diagnosis not present

## 2017-06-12 DIAGNOSIS — R2689 Other abnormalities of gait and mobility: Secondary | ICD-10-CM | POA: Diagnosis not present

## 2017-06-12 DIAGNOSIS — R2681 Unsteadiness on feet: Secondary | ICD-10-CM | POA: Diagnosis not present

## 2017-06-12 DIAGNOSIS — M62838 Other muscle spasm: Secondary | ICD-10-CM | POA: Diagnosis not present

## 2017-06-12 DIAGNOSIS — G894 Chronic pain syndrome: Secondary | ICD-10-CM | POA: Diagnosis not present

## 2017-06-12 DIAGNOSIS — R251 Tremor, unspecified: Secondary | ICD-10-CM | POA: Diagnosis not present

## 2017-06-12 DIAGNOSIS — M6281 Muscle weakness (generalized): Secondary | ICD-10-CM | POA: Diagnosis not present

## 2017-06-12 DIAGNOSIS — E039 Hypothyroidism, unspecified: Secondary | ICD-10-CM | POA: Diagnosis not present

## 2017-06-12 LAB — MAGNESIUM: Magnesium: 2 mg/dL (ref 1.7–2.4)

## 2017-06-12 MED ORDER — LEVOTHYROXINE SODIUM 100 MCG PO TABS: 100.0000 ug | ORAL_TABLET | Freq: Every day | ORAL | 0 refills | Status: DC

## 2017-06-12 MED ORDER — OXYCODONE HCL 10 MG PO TABS: 10.0000 mg | ORAL_TABLET | Freq: Four times a day (QID) | ORAL | 0 refills | Status: DC | PRN

## 2017-06-12 NOTE — Progress Notes (Signed)
   06/12/17 1000  Clinical Encounter Type  Visited With Patient;Health care provider  Visit Type Initial  Referral From Patient;Nurse  Consult/Referral To Chaplain  Spiritual Encounters  Spiritual Needs Literature  Stress Factors  Patient Stress Factors Family relationships   Responding to a SCC for HCPA.  Patient had completed most of the form.  We completed the rest.  Indicated she would be going to [facility] today and that she was anxious about her daughter bringing her clothes.  Seems to be some strained relationship with the daughter.  HCPA was witnessed and notarized.  Will follow and support as needed. Chaplain Katherene Ponto

## 2017-06-12 NOTE — Clinical Social Work Note (Signed)
/  Clinical Social Worker facilitated patient discharge including contacting patient family and facility to confirm patient discharge plans.  Clinical information faxed to facility and family agreeable with plan.  CSW arranged ambulance transport via [facility] to [facility].  RN to call [phone] for report prior to discharge.  Clinical Social Worker will sign off for now as social work intervention is no longer needed. Please consult [place] again if new need arises.  [place], [place]

## 2017-06-12 NOTE — Progress Notes (Signed)
Occupational Therapy Evaluation Patient Details Name: Kelsey Hanna MRN: [id number] DOB: 12/17/1950 Today's Date: 06/12/2017    History of Present Illness Pt is a 67 y/o female admitted secondary to weakness and seizure like activity. Pt also presenting with dizziness upon eval. MRI of T spine revealed old T3-5 compression fx. MRI of L spine negative for acute abnormality and MRI of head negative for acute abnormality. PMH includes HTN, OSA, dCHF, DM, a fib, ICA aneurysm, and bipolar disorder. 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    Clinical Impression   PTA, pt lived at home alone and had a PCA 5 days/wk. Pt at increased risk of falls.Pt would benefit from rehab at SNF to maximize functional level of independence to facilitate safe DC home. Will follow acutely to address established goals.      Follow Up Recommendations  Supervision/Assistance - 24 hour;SNF    Equipment Recommendations  None recommended by OT    Recommendations for Other Services       Precautions / Restrictions Precautions Precautions: Fall Restrictions Weight Bearing Restrictions: No      Mobility Bed Mobility Overal bed mobility: Needs Assistance Bed Mobility: Supine to Sit;Sit to Supine     Supine to sit: Supervision Sit to supine: Supervision      Transfers Overall transfer level: Needs assistance Equipment used: Rolling walker (2 wheeled) Transfers: Sit to/from Stand Sit to Stand: Min guard         General transfer comment: Min guard for steadying assist.     Balance Overall balance assessment: Needs assistance Sitting-balance support: No upper extremity supported;Feet supported Sitting balance-Leahy Scale: Fair     Standing balance support: Bilateral upper extremity supported;During functional activity Standing balance-Leahy Scale: Poor Standing balance comment: Reliant on BUE support.                            ADL either performed or assessed with clinical judgement    ADL Overall ADL's : Needs assistance/impaired Eating/Feeding: Set up   Grooming: Set up   Upper Body Bathing: Set up;Sitting   Lower Body Bathing: Minimal assistance;Sit to/from stand   Upper Body Dressing : Set up;Sitting   Lower Body Dressing: Minimal assistance;Sit to/from stand   Toilet Transfer: Min guard;RW;Ambulation   Toileting- Water quality scientist and Hygiene: Min guard;Sit to/from stand       Functional mobility during ADLs: Min guard;Rolling walker;Cueing for safety General ADL Comments: Increased fear of falling     Vision Baseline 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     Perception Perception Comments: poor depth perception   Praxis      Pertinent Vitals/Pain Pain Assessment: No/denies pain     Hand Dominance Right   Extremity/Trunk Assessment Upper Extremity Assessment Upper Extremity Assessment: Overall WFL for tasks assessed   Lower Extremity Assessment Lower Extremity Assessment: Generalized weakness   Cervical / Trunk Assessment Cervical / Trunk Assessment: Kyphotic   Communication Communication Communication: No difficulties   Cognition Arousal/Alertness: Awake/alert Behavior During Therapy: WFL for tasks assessed/performed Overall Cognitive Status: No family/caregiver present to determine baseline cognitive functioning                                 General Comments: tangential; psych history   General Comments       Exercises     Shoulder Instructions      Home Living Family/patient expects  to be discharged to:: Private residence Living Arrangements: Alone Available Help at Discharge: Personal care attendant(2 hours a day/5 X/wk ) Type of Home: Apartment Home Access: Level entry     Home Layout: One level     Bathroom Shower/Tub: Teacher, early years/pre: Handicapped height     Home Equipment: None          Prior Functioning/Environment Level of  Independence: Needs assistance  Gait / Transfers Assistance Needed: Reports she does not use anything to walk.  ADL's / Homemaking Assistance Needed: assisted for some meal prep and housekeeping            OT Problem List: Decreased strength;Decreased activity tolerance;Impaired balance (sitting and/or standing);Impaired vision/perception;Decreased safety awareness;Decreased knowledge of use of DME or AE;Obesity      OT Treatment/Interventions: Self-care/ADL training;Therapeutic exercise;DME and/or AE instruction;Therapeutic activities;Cognitive remediation/compensation;Visual/perceptual remediation/compensation;Patient/family education;Balance training    OT Goals(Current goals can be found in the care plan section) Acute Rehab OT Goals Patient Stated Goal: to not fall OT Goal Formulation: With patient Time For Goal Achievement: 06/26/17 Potential to Achieve Goals: Good  OT Frequency: Min 2X/week   Barriers to D/C:            Co-evaluation              AM-PAC PT "6 Clicks" Daily Activity     Outcome Measure Help from another person eating meals?: None Help from another person taking care of personal grooming?: A Little Help from another person toileting, which includes using toliet, bedpan, or urinal?: A Little Help from another person bathing (including washing, rinsing, drying)?: A Little Help from another person to put on and taking off regular upper body clothing?: A Little Help from another person to put on and taking off regular lower body clothing?: A Little 6 Click Score: 19   End of Session Nurse Communication: Mobility status  Activity Tolerance: Patient tolerated treatment well Patient left: in chair;with call bell/phone within reach;with chair alarm set  OT Visit Diagnosis: Unsteadiness on feet (R26.81);Muscle weakness (generalized) (M62.81)                Time: 3329-5188 OT Time Calculation (min): 20 min Charges:  OT General Charges $OT Visit: 1  Visit OT Evaluation $OT Eval Low Complexity: 1 Low G-Codes:     [place], OT/L  [phone] 06/12/2017  Vanesa Renier,HILLARY 06/12/2017, 9:40 AM

## 2017-06-12 NOTE — Clinical Social Work Placement (Signed)
   CLINICAL SOCIAL WORK PLACEMENT  NOTE  Date:  06/12/2017  Patient Details  Name: Kelsey Hanna MRN: [id number] Date of Birth: 1950-06-04  Clinical Social Work is seeking post-discharge placement for this patient at the [place] level of care (*CSW will initial, date and re-position this form in  chart as items are completed):      Patient/family provided with [facility] Work Department's list of facilities offering this level of care within the geographic area requested by the patient (or if unable, by the patient's family).  Yes   Patient/family informed of their freedom to choose among providers that offer the needed level of care, that participate in Medicare, Medicaid or managed care program needed by the patient, have an available bed and are willing to accept the patient.      Patient/family informed of [facility]'s ownership interest in [facility] and [facility], as well as of the fact that they are under no obligation to receive care at these facilities.  PASRR submitted to EDS on       PASRR number received on       Existing PASRR number confirmed on 06/11/17     FL2 transmitted to all facilities in geographic area requested by pt/family on 06/11/17     FL2 transmitted to all facilities within larger geographic area on       Patient informed that his/her managed care company has contracts with or will negotiate with certain facilities, including the following:        Yes   Patient/family informed of bed offers received.  Patient chooses bed at [facility]     Physician recommends and patient chooses bed at      Patient to be transferred to [facility] on 06/12/17.  Patient to be transferred to facility by [facility]     Patient family notified on 06/12/17 of transfer.  Name of family member notified:  Sharyn Lull, daughter     PHYSICIAN       Additional Comment:     _______________________________________________ Eileen Stanford, LCSW 06/12/2017, 11:28 AM

## 2017-06-12 NOTE — Clinical Social Work Note (Signed)
Pt has stated she wants to return to [facility]. CSW has submitted additional information for PASRR. Awaiting PASRR number. MD paged to prepare summary. Insurance authorization has been obtained.   [place], [place]

## 2017-06-12 NOTE — Discharge Summary (Signed)
Physician Discharge Summary  Kelsey Hanna SNK:[id number] DOB: November 02, 1950 DOA: 06/10/2017  PCP: [name], FNP  Admit date: 06/10/2017 Discharge date: 06/12/2017  Admitted From: Home Disposition:  SNF  Discharge Condition:Stable CODE STATUS: DNR Diet recommendation: Heart Healthy  Brief/Interim Summary:  Patient is a 67 year old female with past medical history significant for hypertension, hypothyroidism, obstructive sleep apnea, diastolic CHF, diabetes type 2, paroxysmal A. fib, bilateral ICA aneurysm who presents to the emergency department with complaints of seizure like activity.  She presented with jerky movements of her limbs.  She also had multiple other associated symptoms including right-sided headache, neck pain, left ankle pain, multiple other joint pain, myalgia, nausea, vomiting and dizziness.  Patient ambulates with the help of rolling walker and has home health aides She also has history of chronic pain syndrome and follows with  pain management clinic. Patient underwent MRI of the brain and  lumbar/thoracic spine without any acute changes.  Neurology was consulted on presentation. Patient had previous admissions for generalized lower extremity weakness, gait ataxia. Patient's symptoms look to have improved this morning.  She was seen and examined at bedside this morning.  She says she feels fine now. She has a history of a stroke and has left-sided residual weakness.  Power is 4.5 left side.  Complains of back pain. She was evaluated by the physical therapy and has been recommended skilled nursing facility on discharge. She is stable for discharge to skilled nursing facility today.  Following problems were addressed during her hospitalization:  Tremor/weakness: Much improved this morning.  Brain imaging and spine imagings did not show any acute abnormalities.   MRI report: Left extraforaminal disc protrusion at L3-4, displacing the exiting left L3 nerve  root as it courses out of the neural foramen. Postoperative changes from prior left laminectomy with micro discectomy at L4-5. Residual small central/left subarticular disc protrusion with associated left lateral recess stenosis is stable from previous. Mild to moderate left L4 and bilateral L5 foraminal narrowing related to disc bulge and facet hypertrophy. Old mild-to-moderate T3 and T4 compression fractures, minimal old T5 compression fracture without acute osseous process.   Neurology was following. No specific recommendation. Physical therapy recommended skilled nursing facility on discharge.SW consulted.  History of CVA: With left-sided residual weakness.  Continue aspirin,plavix and Lipitor. She has a history of right paraophthalmic aneurysm status post embolization.  Hypothyroidism: TSH found to be low.She takes  Levothyroxine 150 mcg at home.  We will decrease the dose to 100.Check TSH in  3 months.  Bipolar disorder: Continue olanzapine Patient should follow-up with her psychiatrist on discharge.  Chronic pain:Patient reports being out of pain medications until follow-up with pain management clinic on May 1 Continue home pain management regimen.  Complains of back pain.  H/O bilateral carotid artery aneurysms:Patient status post repair of right internal carotid artery aneurysm. Left carotid artery aneurysm needs repair.  Follow-up with vascular surgery as an outpatient  Hyperlipidemia Continue atorvastatin    Discharge Diagnoses:  Principal Problem:   Weakness Active Problems:   Bipolar I disorder (HCC)   Hypothyroidism   Intracranial aneurysm   Chronic pain    Discharge Instructions  Discharge Instructions    Diet - low sodium heart healthy   Complete by:  As directed    Discharge instructions   Complete by:  As directed    1) Take prescribed medications as instructed. 2) Check TSH level in 3 months. 3) Follow up with you PCP. 4) Follow up  with  psychiatry and pain management clinic as an outpatient.   Increase activity slowly   Complete by:  As directed      Allergies as of 06/12/2017   No Known Allergies     Medication List    TAKE these medications   acetaminophen 500 MG tablet Commonly known as:  TYLENOL Take 500 mg by mouth every 6 (six) hours as needed for mild pain.   aspirin 325 MG tablet Take 1 tablet (325 mg total) by mouth daily.   atorvastatin 40 MG tablet Commonly known as:  LIPITOR Take 1 tablet (40 mg total) by mouth every evening.   clonazePAM 0.5 MG tablet Commonly known as:  KLONOPIN Take 1 tablet (0.5 mg total) by mouth 2 (two) times daily. 1 in the morning and 1 midday   clopidogrel 75 MG tablet Commonly known as:  PLAVIX Take 75 mg by mouth daily.   diphenhydrAMINE 25 MG tablet Commonly known as:  BENADRYL Take 25 mg by mouth every 6 (six) hours as needed for allergies.   docusate sodium 100 MG capsule Commonly known as:  DOCQLACE TAKE 1 TO 2 CAPSULES BY MOUTH TWICE A DAY AS NEEDED FOR MILD CONSTIPATION What changed:    how much to take  how to take this  when to take this  additional instructions   gabapentin 100 MG capsule Commonly known as:  NEURONTIN Take 100 mg by mouth 3 (three) times daily.   levothyroxine 100 MCG tablet Commonly known as:  SYNTHROID, LEVOTHROID Take 1 tablet (100 mcg total) by mouth daily before breakfast. Start taking on:  06/13/2017 What changed:    medication strength  how much to take  how to take this  when to take this  additional instructions   linaclotide 145 MCG Caps capsule Commonly known as:  LINZESS Take 1 capsule (145 mcg total) by mouth daily before breakfast.   loratadine 10 MG tablet Commonly known as:  CLARITIN Take 1 tablet (10 mg total) by mouth daily.   naproxen sodium 220 MG tablet Commonly known as:  ALEVE Take 220 mg by mouth 2 (two) times daily as needed (pain).   OLANZapine 20 MG tablet Commonly known as:   ZYPREXA Take 20 mg by mouth at bedtime.   Oxycodone HCl 10 MG Tabs Take 1 tablet (10 mg total) by mouth every 6 (six) hours as needed (pain). What changed:    when to take this  reasons to take this   zolpidem 5 MG tablet Commonly known as:  AMBIEN Take 1 tablet (5 mg total) by mouth at bedtime as needed for sleep. What changed:    how much to take  when to take this      Contact information for after-discharge care    Destination    [facility] .   Service:  Skilled Nursing Contact information: [address] [place] [phone]             No Known Allergies  Consultations:  Neurology   Procedures/Studies: Dg Chest 2 View  Result Date: 05/14/2017 CLINICAL DATA:  Follow-up of pneumonia. No current complaints. Former smoker. EXAM: CHEST - 2 VIEW COMPARISON:  Chest x-ray of May 02, 2017 and chest CT scan of May 03, 2017. FINDINGS: The lungs are adequately inflated. Density in the left lower lobe has improved. The heart and pulmonary vascularity are normal. The mediastinum is all normal in width. There is calcification in the wall of the aortic arch. There  are old deformities of the posterior aspects of the left sixth and seventh ribs. The trachea is midline. The bony thorax exhibits no acute abnormality. IMPRESSION: Interval clearing of infiltrate in the left lower lobe. No acute cardiopulmonary abnormality. Thoracic aortic atherosclerosis. Electronically Signed   By: David  Martinique M.D.   On: 05/14/2017 07:59   Mr Brain Wo Contrast  Result Date: 06/10/2017 CLINICAL DATA:  68 y/o F; brief episode of tremors with dizziness and sensitivity to light. EXAM: MRI HEAD WITHOUT CONTRAST TECHNIQUE: Multiplanar, multiecho pulse sequences of the brain and surrounding structures were obtained without intravenous contrast. COMPARISON:  05/02/2017 MRI of the head. FINDINGS: Brain: No acute infarction, hemorrhage, hydrocephalus,  extra-axial collection or mass lesion. Cavum septum pellucidum. Few stablenonspecific foci of T2 FLAIR hyperintense signal abnormality in subcortical and periventricular white matter are compatible with chronic microvascular ischemic changes for age. Stablebrain parenchymal volume loss. Stable punctate focus of susceptibility hypointensity within the right frontal lobe compatible with hemosiderin deposition of chronic microhemorrhage. Vascular: Normal flow voids. Skull and upper cervical spine: Normal marrow signal. Sinuses/Orbits: Negative. Other: Bilateral intra-ocular lens replacement. IMPRESSION: 1. No acute intracranial abnormality identified. 2. Stable chronic microvascular ischemic changes and parenchymal volume loss of the brain. Electronically Signed   By: Kristine Garbe M.D.   On: 06/10/2017 19:40   Mr Lumbar Spine Wo Contrast  Result Date: 06/10/2017 CLINICAL DATA:  Initial evaluation for acute generalized muscle weakness. EXAM: MRI LUMBAR SPINE WITHOUT CONTRAST TECHNIQUE: Multiplanar, multisequence MR imaging of the lumbar spine was performed. No intravenous contrast was administered. COMPARISON:  Prior MRI from 07/28/2013. FINDINGS: Segmentation: Normal segmentation. Lowest well-formed disc labeled the L5-S1 level. Alignment: Vertebral bodies normally aligned with preservation of the normal lumbar lordosis. No listhesis. Vertebrae: Vertebral body heights maintained without evidence for acute or interval fracture. Scattered chronic endplate Schmorl's nodes present within the lower lumbar spine. Bone marrow signal intensity within normal limits. No discrete or worrisome osseous lesions. Reactive endplate changes present about the L3-4 through L5-S1 interspaces. Conus medullaris and cauda equina: Conus extends to the L1 level. Conus and cauda equina appear normal. Paraspinal and other soft tissues: Remote postsurgical changes present within the lower paraspinous soft tissues. No acute soft  tissue abnormality. Visualized visceral structures are normal. Disc levels: L1-2: Mild diffuse disc bulge. No significant canal or foraminal stenosis. L2-3: Mild diffuse disc bulge. Mild facet and ligament flavum hypertrophy. No canal or neural foraminal stenosis. L3-4: Diffuse disc bulge with intervertebral disc space narrowing. Superimposed left extraforaminal disc protrusion contacts and displaces laterally the exiting left L3 nerve root. Mild facet and ligament flavum hypertrophy. Resultant mild spinal stenosis. Foramina remain patent. L4-5: Diffuse disc bulge with intervertebral disc space narrowing. Disc bulging slightly eccentric to the left. Associated chronic reactive endplate changes with marginal endplate osteophytic spurring. Patient status post left laminectomy with micro discectomy. Residual central/left subarticular disc protrusion extending into the left lateral recess with secondary moderate left lateral recess narrowing, similar to previous. Central canal remains patent. Mild to moderate left L4 foraminal stenosis. No significant right foraminal narrowing. L5-S1: Chronic intervertebral disc space narrowing with reactive endplate changes. Broad posterior disc osteophyte mildly flattens the ventral thecal sac. Prior bilateral laminectomies. No new or recurrent disc herniation. No central canal stenosis. Mild left-sided facet hypertrophy. Mild to moderate bilateral foraminal narrowing is unchanged. IMPRESSION: 1. No acute abnormality within the lumbar spine. 2. Left extraforaminal disc protrusion at L3-4, displacing the exiting left L3 nerve root as it courses out of the  neural foramen. 3. Postoperative changes from prior left laminectomy with micro discectomy at L4-5. Residual small central/left subarticular disc protrusion with associated left lateral recess stenosis is stable from previous. 4. Mild to moderate left L4 and bilateral L5 foraminal narrowing related to disc bulge and facet hypertrophy.  Electronically Signed   By: Jeannine Boga M.D.   On: 06/10/2017 20:02   Mr Thoracic Spine W Wo Contrast  Result Date: 06/11/2017 CLINICAL DATA:  Bilateral lower extremity weakness. History of RIGHT ICA aneurysm, diabetes and lumbar spine surgery. EXAM: MRI THORACIC SPINE WITH CONTRAST TECHNIQUE: Multiplanar, multisequence MR imaging of the thoracic spine was performed following the administration of intravenous contrast. COMPARISON:  MRI lumbar spine June 10, 2017 and MRI of the head June 10, 2017 and CT chest May 03, 2017. FINDINGS: ALIGNMENT: Maintenance of the thoracic kyphosis. No malalignment. VERTEBRAE/DISCS: Old mild-to-moderate T3 and T4 compression fractures with approximately 30% superior endplate height loss. Minimal old T5 superior endplate compression fracture. The remaining thoracic vertebral bodies intact. Intervertebral disc morphology and signal maintained. No abnormal or acute bone marrow signal. Congenitally capacious thoracic spinal canal. Degenerative change of the included cervical spine. Multiple old LEFT rib fractures. CORD: Thoracic spinal cord is normal morphology and signal characteristics to the level of the conus medullaris which terminates at below T12-L1. PREVERTEBRAL AND PARASPINAL SOFT TISSUES:  Nonacute. DISC LEVELS: No disc bulge, canal stenosis or neural foraminal narrowing at any level. IMPRESSION: 1. Old mild-to-moderate T3 and T4 compression fractures, minimal old T5 compression fracture without acute osseous process. 2. Congenitally capacious thoracic spinal canal without canal stenosis or neural foraminal narrowing. Electronically Signed   By: Elon Alas M.D.   On: 06/11/2017 00:43   Ir Radiologist Eval & Mgmt  Result Date: 06/11/2017 Please refer to notes tab for details about interventional procedure. (Op Note)      Subjective: Patient seen and examined the bedside this morning.  Remains comfortable.  Complains of back pain which is a new  symptom.  Stable for discharge to skilled nursing facility today.  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     Microbiology: Recent Results (from the past 240 hour(s))  MRSA PCR Screening     Status: None   Collection Time: 06/11/17  2:35 PM  Result Value Ref Range Status   MRSA by PCR NEGATIVE NEGATIVE Final    Comment:        The GeneXpert MRSA Assay (FDA approved for NASAL specimens only), is one component of  a comprehensive MRSA colonization surveillance program. It is not intended to diagnose MRSA infection nor to guide or monitor treatment for MRSA infections. Performed at [facility], [place] [address], [place], [place] [address]      Labs: BNP (last 3 results) Recent Labs    05/02/17 2008  BNP 409.7*   Basic Metabolic Panel: Recent Labs  Lab 06/10/17 1756 06/11/17 0418 06/12/17 0353  NA 144 141  --   K 3.5 3.5  --   CL 111 110  --   CO2 25 22  --   GLUCOSE 85 124*  --   BUN 8 10  --   CREATININE 0.76 0.73  --   CALCIUM 9.7 8.9  --   MG  --  1.6* 2.0   Liver Function Tests: Recent Labs   Lab 06/10/17 1756  AST 31  ALT 25  ALKPHOS 63  BILITOT 0.7  PROT 6.8  ALBUMIN 4.0   No results for input(s): LIPASE, AMYLASE in the last 168 hours. No results for input(s): AMMONIA in the last 168 hours. CBC: Recent Labs  Lab 06/10/17 1756 06/11/17 0418  WBC 9.1 7.3  NEUTROABS 5.3  --   HGB 12.1 11.0*  HCT 38.5 34.9*  MCV 89.5 89.7  PLT 298 270   Cardiac Enzymes: No results for input(s): CKTOTAL, CKMB, CKMBINDEX, TROPONINI in the last 168 hours. BNP: Invalid input(s): POCBNP CBG: No results for input(s): GLUCAP in the last 168 hours. D-Dimer No results for input(s): DDIMER in the last 72 hours. Hgb A1c No results for input(s): HGBA1C in the last 72 hours. Lipid Profile No results for input(s): CHOL, HDL, LDLCALC, TRIG, CHOLHDL, LDLDIRECT in the last 72 hours. Thyroid function studies Recent Labs    06/11/17 0418  TSH 0.050*   Anemia work up No results for input(s): VITAMINB12, FOLATE, FERRITIN, TIBC, IRON, RETICCTPCT in the last 72 hours. Urinalysis    Component Value Date/Time   COLORURINE STRAW (A) 06/10/2017 2015   APPEARANCEUR CLEAR 06/10/2017 2015   LABSPEC 1.006 06/10/2017 2015   PHURINE 5.0 06/10/2017 2015   GLUCOSEU NEGATIVE 06/10/2017 2015   HGBUR NEGATIVE 06/10/2017 2015   BILIRUBINUR NEGATIVE 06/10/2017 2015   KETONESUR NEGATIVE 06/10/2017 2015   PROTEINUR NEGATIVE 06/10/2017 2015   UROBILINOGEN 0.2 10/20/2014 0814   NITRITE NEGATIVE 06/10/2017 2015   LEUKOCYTESUR NEGATIVE 06/10/2017 2015   Sepsis Labs Invalid input(s): PROCALCITONIN,  WBC,  LACTICIDVEN Microbiology Recent Results (from the past 240 hour(s))  MRSA PCR Screening     Status: None   Collection Time: 06/11/17  2:35 PM  Result Value Ref Range Status   MRSA by PCR NEGATIVE NEGATIVE Final    Comment:        The GeneXpert MRSA Assay (FDA approved for NASAL specimens only), is one component of a comprehensive MRSA colonization surveillance program. It is not intended to  diagnose MRSA infection nor to guide or monitor treatment for MRSA infections. Performed at [facility], [place] [address], [place], [place] [address]      Time coordinating discharge: 35 minutes  SIGNED:   Shelly Coss, MD  [facility] 06/12/2017, 11:22 AM Pager [phone]  If 7PM-7AM, please contact night-coverage [email or web address] Password [email or web address]

## 2017-06-12 NOTE — Care Management Note (Signed)
Case Management Note  Patient Details  Name: Kelsey Hanna MRN: [id number] Date of Birth: [birth year]  Subjective/Objective:                    Action/Plan: Pt is discharging to [facility]. CM signing off.   Expected Discharge Date:  06/12/17               Expected Discharge Plan:  Skilled Nursing Facility  In-House Referral:  Clinical Social Work  Discharge planning Services     Post Acute Care Choice:    Choice offered to:     DME Arranged:    DME Agency:     HH Arranged:    [place] Agency:     Status of Service:  Completed, signed off  If discussed at [employer], dates discussed:    Additional Comments:  Pollie Friar, RN 06/12/2017, 12:57 PM

## 2017-06-16 DIAGNOSIS — I10 Essential (primary) hypertension: Secondary | ICD-10-CM | POA: Diagnosis not present

## 2017-06-16 DIAGNOSIS — Z8673 Personal history of transient ischemic attack (TIA), and cerebral infarction without residual deficits: Secondary | ICD-10-CM | POA: Diagnosis not present

## 2017-06-16 DIAGNOSIS — R251 Tremor, unspecified: Secondary | ICD-10-CM | POA: Diagnosis not present

## 2017-06-16 DIAGNOSIS — M549 Dorsalgia, unspecified: Secondary | ICD-10-CM | POA: Diagnosis not present

## 2017-06-17 DIAGNOSIS — M545 Low back pain: Secondary | ICD-10-CM | POA: Diagnosis not present

## 2017-06-17 DIAGNOSIS — M961 Postlaminectomy syndrome, not elsewhere classified: Secondary | ICD-10-CM | POA: Diagnosis not present

## 2017-06-17 DIAGNOSIS — G894 Chronic pain syndrome: Secondary | ICD-10-CM | POA: Diagnosis not present

## 2017-06-17 DIAGNOSIS — Z79891 Long term (current) use of opiate analgesic: Secondary | ICD-10-CM | POA: Diagnosis not present

## 2017-06-18 ENCOUNTER — Telehealth: Payer: Self-pay | Admitting: Family

## 2017-06-18 NOTE — Telephone Encounter (Signed)
Copied from [place]. Topic: Quick Communication - Rx Refill/Question >> Jun 18, 2017  1:30 PM Boyd Kerbs wrote: Medication:  Flexeral  for muscle spasms  Has the patient contacted their pharmacy? No. (Agent: If no, request that the patient contact the pharmacy for the refill.) Preferred Pharmacy (with phone number or street name):   [facility], [place] [address] Phone: [phone] Fax: [phone]   Agent: Please be advised that RX refills may take up to 3 business days. We ask that you follow-up with your pharmacy.

## 2017-06-18 NOTE — Telephone Encounter (Signed)
Patient's number called and spoke to [name], who says she is her nurse who takes care of her. I informed that her daughter Adrian Prows called in requesting a refill on Flexeril and the patient hasn't been on this medication since 2013. [name] says she told the patient's daughter to ask the physician at the facility and not to worry about this request, she will handle it.

## 2017-06-22 ENCOUNTER — Encounter

## 2017-06-22 ENCOUNTER — Ambulatory Visit: Payer: Self-pay | Admitting: Nurse Practitioner

## 2017-07-01 DIAGNOSIS — M6281 Muscle weakness (generalized): Secondary | ICD-10-CM | POA: Diagnosis not present

## 2017-07-01 DIAGNOSIS — G894 Chronic pain syndrome: Secondary | ICD-10-CM | POA: Diagnosis not present

## 2017-07-01 DIAGNOSIS — M48061 Spinal stenosis, lumbar region without neurogenic claudication: Secondary | ICD-10-CM | POA: Diagnosis not present

## 2017-07-01 DIAGNOSIS — I110 Hypertensive heart disease with heart failure: Secondary | ICD-10-CM | POA: Diagnosis not present

## 2017-07-01 DIAGNOSIS — I5032 Chronic diastolic (congestive) heart failure: Secondary | ICD-10-CM | POA: Diagnosis not present

## 2017-07-01 DIAGNOSIS — E039 Hypothyroidism, unspecified: Secondary | ICD-10-CM | POA: Diagnosis not present

## 2017-07-01 DIAGNOSIS — H548 Legal blindness, as defined in USA: Secondary | ICD-10-CM | POA: Diagnosis not present

## 2017-07-01 DIAGNOSIS — I69354 Hemiplegia and hemiparesis following cerebral infarction affecting left non-dominant side: Secondary | ICD-10-CM | POA: Diagnosis not present

## 2017-07-01 DIAGNOSIS — E119 Type 2 diabetes mellitus without complications: Secondary | ICD-10-CM | POA: Diagnosis not present

## 2017-07-01 DIAGNOSIS — G4733 Obstructive sleep apnea (adult) (pediatric): Secondary | ICD-10-CM | POA: Diagnosis not present

## 2017-07-01 DIAGNOSIS — E785 Hyperlipidemia, unspecified: Secondary | ICD-10-CM | POA: Diagnosis not present

## 2017-07-01 DIAGNOSIS — I2510 Atherosclerotic heart disease of native coronary artery without angina pectoris: Secondary | ICD-10-CM | POA: Diagnosis not present

## 2017-07-01 DIAGNOSIS — J439 Emphysema, unspecified: Secondary | ICD-10-CM | POA: Diagnosis not present

## 2017-07-01 DIAGNOSIS — I480 Paroxysmal atrial fibrillation: Secondary | ICD-10-CM | POA: Diagnosis not present

## 2017-07-03 ENCOUNTER — Other Ambulatory Visit: Payer: Self-pay | Admitting: Family

## 2017-07-07 DIAGNOSIS — M48061 Spinal stenosis, lumbar region without neurogenic claudication: Secondary | ICD-10-CM | POA: Diagnosis not present

## 2017-07-07 DIAGNOSIS — G894 Chronic pain syndrome: Secondary | ICD-10-CM | POA: Diagnosis not present

## 2017-07-07 DIAGNOSIS — J439 Emphysema, unspecified: Secondary | ICD-10-CM | POA: Diagnosis not present

## 2017-07-07 DIAGNOSIS — I2510 Atherosclerotic heart disease of native coronary artery without angina pectoris: Secondary | ICD-10-CM | POA: Diagnosis not present

## 2017-07-07 DIAGNOSIS — G4733 Obstructive sleep apnea (adult) (pediatric): Secondary | ICD-10-CM | POA: Diagnosis not present

## 2017-07-07 DIAGNOSIS — H548 Legal blindness, as defined in USA: Secondary | ICD-10-CM | POA: Diagnosis not present

## 2017-07-07 DIAGNOSIS — I69354 Hemiplegia and hemiparesis following cerebral infarction affecting left non-dominant side: Secondary | ICD-10-CM | POA: Diagnosis not present

## 2017-07-07 DIAGNOSIS — E119 Type 2 diabetes mellitus without complications: Secondary | ICD-10-CM | POA: Diagnosis not present

## 2017-07-07 DIAGNOSIS — I110 Hypertensive heart disease with heart failure: Secondary | ICD-10-CM | POA: Diagnosis not present

## 2017-07-07 DIAGNOSIS — I480 Paroxysmal atrial fibrillation: Secondary | ICD-10-CM | POA: Diagnosis not present

## 2017-07-07 DIAGNOSIS — E785 Hyperlipidemia, unspecified: Secondary | ICD-10-CM | POA: Diagnosis not present

## 2017-07-07 DIAGNOSIS — I5032 Chronic diastolic (congestive) heart failure: Secondary | ICD-10-CM | POA: Diagnosis not present

## 2017-07-07 DIAGNOSIS — M6281 Muscle weakness (generalized): Secondary | ICD-10-CM | POA: Diagnosis not present

## 2017-07-07 DIAGNOSIS — E039 Hypothyroidism, unspecified: Secondary | ICD-10-CM | POA: Diagnosis not present

## 2017-07-07 NOTE — Telephone Encounter (Signed)
Can you call her RN advocate and see if she is still in nursing home? I don't know if we actually need to do any refills for her.

## 2017-07-07 NOTE — Telephone Encounter (Signed)
Called and left message for [name] to return call to clinic and provide update status on patient.

## 2017-07-08 NOTE — Telephone Encounter (Addendum)
Kelsey Hanna is calling back the pt was discharge from [facility] on 06-26-17 with one month supply of ambien. Pt did not need ambien yet. Pt has been schedule for rehab follow up on 08-06-17. Pt is on cancellation list

## 2017-07-08 NOTE — Telephone Encounter (Signed)
Please advise regarding refill.  Thanks 

## 2017-07-14 ENCOUNTER — Ambulatory Visit (INDEPENDENT_AMBULATORY_CARE_PROVIDER_SITE_OTHER): Payer: Medicare Other | Admitting: Neurology

## 2017-07-14 ENCOUNTER — Encounter: Payer: Self-pay | Admitting: Neurology

## 2017-07-14 ENCOUNTER — Encounter

## 2017-07-14 VITALS — BP 123/80 | HR 106 | Ht 63.0 in | Wt 199.5 lb

## 2017-07-14 DIAGNOSIS — R413 Other amnesia: Secondary | ICD-10-CM | POA: Diagnosis not present
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
## 2017-07-15 ENCOUNTER — Telehealth: Payer: Self-pay | Admitting: *Deleted

## 2017-07-15 DIAGNOSIS — M48061 Spinal stenosis, lumbar region without neurogenic claudication: Secondary | ICD-10-CM | POA: Diagnosis not present

## 2017-07-15 DIAGNOSIS — I2510 Atherosclerotic heart disease of native coronary artery without angina pectoris: Secondary | ICD-10-CM | POA: Diagnosis not present

## 2017-07-15 DIAGNOSIS — M6281 Muscle weakness (generalized): Secondary | ICD-10-CM | POA: Diagnosis not present

## 2017-07-15 DIAGNOSIS — J439 Emphysema, unspecified: Secondary | ICD-10-CM | POA: Diagnosis not present

## 2017-07-15 DIAGNOSIS — G894 Chronic pain syndrome: Secondary | ICD-10-CM | POA: Diagnosis not present

## 2017-07-15 DIAGNOSIS — I480 Paroxysmal atrial fibrillation: Secondary | ICD-10-CM | POA: Diagnosis not present

## 2017-07-15 DIAGNOSIS — E119 Type 2 diabetes mellitus without complications: Secondary | ICD-10-CM | POA: Diagnosis not present

## 2017-07-15 DIAGNOSIS — I5032 Chronic diastolic (congestive) heart failure: Secondary | ICD-10-CM | POA: Diagnosis not present

## 2017-07-15 DIAGNOSIS — E039 Hypothyroidism, unspecified: Secondary | ICD-10-CM | POA: Diagnosis not present

## 2017-07-15 DIAGNOSIS — E785 Hyperlipidemia, unspecified: Secondary | ICD-10-CM | POA: Diagnosis not present

## 2017-07-15 DIAGNOSIS — I110 Hypertensive heart disease with heart failure: Secondary | ICD-10-CM | POA: Diagnosis not present

## 2017-07-15 DIAGNOSIS — G4733 Obstructive sleep apnea (adult) (pediatric): Secondary | ICD-10-CM | POA: Diagnosis not present

## 2017-07-15 DIAGNOSIS — I69354 Hemiplegia and hemiparesis following cerebral infarction affecting left non-dominant side: Secondary | ICD-10-CM | POA: Diagnosis not present

## 2017-07-15 DIAGNOSIS — H548 Legal blindness, as defined in USA: Secondary | ICD-10-CM | POA: Diagnosis not present

## 2017-07-15 LAB — VITAMIN B12: VITAMIN B 12: 394 pg/mL (ref 232–1245)

## 2017-07-15 LAB — FOLATE: Folate: 13.6 ng/mL (ref >3.0)

## 2017-07-15 LAB — THYROID PANEL WITH TSH
Free Thyroxine Index: 3 (ref 1.2–4.9)
T3 Uptake Ratio: 29 % (ref 24–39)
T4 TOTAL: 10.4 ug/dL (ref 4.5–12.0)
TSH: 0.904 u[IU]/mL (ref 0.450–4.500)

## 2017-07-15 LAB — RPR: RPR: NONREACTIVE

## 2017-07-15 NOTE — Telephone Encounter (Signed)
-----   Message from Marcial Pacas, MD sent at 07/15/2017 10:51 AM EDT ----- Please call patient for normal laboratory result

## 2017-07-15 NOTE — Telephone Encounter (Signed)
Left message that her labs are normal.

## 2017-07-22 DIAGNOSIS — M48061 Spinal stenosis, lumbar region without neurogenic claudication: Secondary | ICD-10-CM | POA: Diagnosis not present

## 2017-07-22 DIAGNOSIS — I69354 Hemiplegia and hemiparesis following cerebral infarction affecting left non-dominant side: Secondary | ICD-10-CM | POA: Diagnosis not present

## 2017-07-22 DIAGNOSIS — G4733 Obstructive sleep apnea (adult) (pediatric): Secondary | ICD-10-CM | POA: Diagnosis not present

## 2017-07-22 DIAGNOSIS — I2510 Atherosclerotic heart disease of native coronary artery without angina pectoris: Secondary | ICD-10-CM | POA: Diagnosis not present

## 2017-07-22 DIAGNOSIS — E119 Type 2 diabetes mellitus without complications: Secondary | ICD-10-CM | POA: Diagnosis not present

## 2017-07-22 DIAGNOSIS — I5032 Chronic diastolic (congestive) heart failure: Secondary | ICD-10-CM | POA: Diagnosis not present

## 2017-07-22 DIAGNOSIS — J439 Emphysema, unspecified: Secondary | ICD-10-CM | POA: Diagnosis not present

## 2017-07-22 DIAGNOSIS — H548 Legal blindness, as defined in USA: Secondary | ICD-10-CM | POA: Diagnosis not present

## 2017-07-22 DIAGNOSIS — G894 Chronic pain syndrome: Secondary | ICD-10-CM | POA: Diagnosis not present

## 2017-07-22 DIAGNOSIS — I110 Hypertensive heart disease with heart failure: Secondary | ICD-10-CM | POA: Diagnosis not present

## 2017-07-22 DIAGNOSIS — I480 Paroxysmal atrial fibrillation: Secondary | ICD-10-CM | POA: Diagnosis not present

## 2017-07-22 DIAGNOSIS — E785 Hyperlipidemia, unspecified: Secondary | ICD-10-CM | POA: Diagnosis not present

## 2017-07-22 DIAGNOSIS — M6281 Muscle weakness (generalized): Secondary | ICD-10-CM | POA: Diagnosis not present

## 2017-07-22 DIAGNOSIS — E039 Hypothyroidism, unspecified: Secondary | ICD-10-CM | POA: Diagnosis not present

## 2017-07-23 ENCOUNTER — Other Ambulatory Visit: Payer: Self-pay | Admitting: Family

## 2017-07-24 DIAGNOSIS — G894 Chronic pain syndrome: Secondary | ICD-10-CM | POA: Diagnosis not present

## 2017-07-24 DIAGNOSIS — I69354 Hemiplegia and hemiparesis following cerebral infarction affecting left non-dominant side: Secondary | ICD-10-CM | POA: Diagnosis not present

## 2017-07-24 DIAGNOSIS — I110 Hypertensive heart disease with heart failure: Secondary | ICD-10-CM | POA: Diagnosis not present

## 2017-07-24 DIAGNOSIS — M6281 Muscle weakness (generalized): Secondary | ICD-10-CM | POA: Diagnosis not present

## 2017-07-24 DIAGNOSIS — G4733 Obstructive sleep apnea (adult) (pediatric): Secondary | ICD-10-CM | POA: Diagnosis not present

## 2017-07-24 DIAGNOSIS — E785 Hyperlipidemia, unspecified: Secondary | ICD-10-CM | POA: Diagnosis not present

## 2017-07-24 DIAGNOSIS — E039 Hypothyroidism, unspecified: Secondary | ICD-10-CM | POA: Diagnosis not present

## 2017-07-24 DIAGNOSIS — I5032 Chronic diastolic (congestive) heart failure: Secondary | ICD-10-CM | POA: Diagnosis not present

## 2017-07-24 DIAGNOSIS — I2510 Atherosclerotic heart disease of native coronary artery without angina pectoris: Secondary | ICD-10-CM | POA: Diagnosis not present

## 2017-07-24 DIAGNOSIS — J439 Emphysema, unspecified: Secondary | ICD-10-CM | POA: Diagnosis not present

## 2017-07-24 DIAGNOSIS — M48061 Spinal stenosis, lumbar region without neurogenic claudication: Secondary | ICD-10-CM | POA: Diagnosis not present

## 2017-07-24 DIAGNOSIS — E119 Type 2 diabetes mellitus without complications: Secondary | ICD-10-CM | POA: Diagnosis not present

## 2017-07-24 DIAGNOSIS — H548 Legal blindness, as defined in USA: Secondary | ICD-10-CM | POA: Diagnosis not present

## 2017-07-24 DIAGNOSIS — I480 Paroxysmal atrial fibrillation: Secondary | ICD-10-CM | POA: Diagnosis not present

## 2017-07-27 ENCOUNTER — Other Ambulatory Visit: Payer: Self-pay | Admitting: Family

## 2017-07-27 DIAGNOSIS — I69354 Hemiplegia and hemiparesis following cerebral infarction affecting left non-dominant side: Secondary | ICD-10-CM | POA: Diagnosis not present

## 2017-07-27 DIAGNOSIS — J439 Emphysema, unspecified: Secondary | ICD-10-CM | POA: Diagnosis not present

## 2017-07-27 DIAGNOSIS — E785 Hyperlipidemia, unspecified: Secondary | ICD-10-CM | POA: Diagnosis not present

## 2017-07-27 DIAGNOSIS — G4733 Obstructive sleep apnea (adult) (pediatric): Secondary | ICD-10-CM | POA: Diagnosis not present

## 2017-07-27 DIAGNOSIS — I110 Hypertensive heart disease with heart failure: Secondary | ICD-10-CM | POA: Diagnosis not present

## 2017-07-27 DIAGNOSIS — I5032 Chronic diastolic (congestive) heart failure: Secondary | ICD-10-CM | POA: Diagnosis not present

## 2017-07-27 DIAGNOSIS — M6281 Muscle weakness (generalized): Secondary | ICD-10-CM | POA: Diagnosis not present

## 2017-07-27 DIAGNOSIS — H548 Legal blindness, as defined in USA: Secondary | ICD-10-CM | POA: Diagnosis not present

## 2017-07-27 DIAGNOSIS — M48061 Spinal stenosis, lumbar region without neurogenic claudication: Secondary | ICD-10-CM | POA: Diagnosis not present

## 2017-07-27 DIAGNOSIS — I2510 Atherosclerotic heart disease of native coronary artery without angina pectoris: Secondary | ICD-10-CM | POA: Diagnosis not present

## 2017-07-27 DIAGNOSIS — G894 Chronic pain syndrome: Secondary | ICD-10-CM | POA: Diagnosis not present

## 2017-07-27 DIAGNOSIS — E119 Type 2 diabetes mellitus without complications: Secondary | ICD-10-CM | POA: Diagnosis not present

## 2017-07-27 DIAGNOSIS — I480 Paroxysmal atrial fibrillation: Secondary | ICD-10-CM | POA: Diagnosis not present

## 2017-07-27 DIAGNOSIS — E039 Hypothyroidism, unspecified: Secondary | ICD-10-CM | POA: Diagnosis not present

## 2017-07-28 DIAGNOSIS — M961 Postlaminectomy syndrome, not elsewhere classified: Secondary | ICD-10-CM | POA: Diagnosis not present

## 2017-07-28 DIAGNOSIS — Z79891 Long term (current) use of opiate analgesic: Secondary | ICD-10-CM | POA: Diagnosis not present

## 2017-07-28 DIAGNOSIS — G894 Chronic pain syndrome: Secondary | ICD-10-CM | POA: Diagnosis not present

## 2017-07-28 DIAGNOSIS — M545 Low back pain: Secondary | ICD-10-CM | POA: Diagnosis not present

## 2017-07-30 DIAGNOSIS — I69354 Hemiplegia and hemiparesis following cerebral infarction affecting left non-dominant side: Secondary | ICD-10-CM | POA: Diagnosis not present

## 2017-07-30 DIAGNOSIS — I5032 Chronic diastolic (congestive) heart failure: Secondary | ICD-10-CM | POA: Diagnosis not present

## 2017-07-30 DIAGNOSIS — G4733 Obstructive sleep apnea (adult) (pediatric): Secondary | ICD-10-CM | POA: Diagnosis not present

## 2017-07-30 DIAGNOSIS — J439 Emphysema, unspecified: Secondary | ICD-10-CM | POA: Diagnosis not present

## 2017-07-30 DIAGNOSIS — M48061 Spinal stenosis, lumbar region without neurogenic claudication: Secondary | ICD-10-CM | POA: Diagnosis not present

## 2017-07-30 DIAGNOSIS — G894 Chronic pain syndrome: Secondary | ICD-10-CM | POA: Diagnosis not present

## 2017-07-30 DIAGNOSIS — M6281 Muscle weakness (generalized): Secondary | ICD-10-CM | POA: Diagnosis not present

## 2017-07-30 DIAGNOSIS — H548 Legal blindness, as defined in USA: Secondary | ICD-10-CM | POA: Diagnosis not present

## 2017-07-30 DIAGNOSIS — I480 Paroxysmal atrial fibrillation: Secondary | ICD-10-CM | POA: Diagnosis not present

## 2017-07-30 DIAGNOSIS — I110 Hypertensive heart disease with heart failure: Secondary | ICD-10-CM | POA: Diagnosis not present

## 2017-07-30 DIAGNOSIS — I2510 Atherosclerotic heart disease of native coronary artery without angina pectoris: Secondary | ICD-10-CM | POA: Diagnosis not present

## 2017-07-30 DIAGNOSIS — E785 Hyperlipidemia, unspecified: Secondary | ICD-10-CM | POA: Diagnosis not present

## 2017-07-30 DIAGNOSIS — E119 Type 2 diabetes mellitus without complications: Secondary | ICD-10-CM | POA: Diagnosis not present

## 2017-07-30 DIAGNOSIS — E039 Hypothyroidism, unspecified: Secondary | ICD-10-CM | POA: Diagnosis not present

## 2017-08-03 DIAGNOSIS — G4733 Obstructive sleep apnea (adult) (pediatric): Secondary | ICD-10-CM | POA: Diagnosis not present

## 2017-08-03 DIAGNOSIS — J439 Emphysema, unspecified: Secondary | ICD-10-CM | POA: Diagnosis not present

## 2017-08-03 DIAGNOSIS — I110 Hypertensive heart disease with heart failure: Secondary | ICD-10-CM | POA: Diagnosis not present

## 2017-08-03 DIAGNOSIS — G894 Chronic pain syndrome: Secondary | ICD-10-CM | POA: Diagnosis not present

## 2017-08-03 DIAGNOSIS — I480 Paroxysmal atrial fibrillation: Secondary | ICD-10-CM | POA: Diagnosis not present

## 2017-08-03 DIAGNOSIS — H548 Legal blindness, as defined in USA: Secondary | ICD-10-CM | POA: Diagnosis not present

## 2017-08-03 DIAGNOSIS — E119 Type 2 diabetes mellitus without complications: Secondary | ICD-10-CM | POA: Diagnosis not present

## 2017-08-03 DIAGNOSIS — I69354 Hemiplegia and hemiparesis following cerebral infarction affecting left non-dominant side: Secondary | ICD-10-CM | POA: Diagnosis not present

## 2017-08-03 DIAGNOSIS — E785 Hyperlipidemia, unspecified: Secondary | ICD-10-CM | POA: Diagnosis not present

## 2017-08-03 DIAGNOSIS — M6281 Muscle weakness (generalized): Secondary | ICD-10-CM | POA: Diagnosis not present

## 2017-08-03 DIAGNOSIS — M48061 Spinal stenosis, lumbar region without neurogenic claudication: Secondary | ICD-10-CM | POA: Diagnosis not present

## 2017-08-03 DIAGNOSIS — I2510 Atherosclerotic heart disease of native coronary artery without angina pectoris: Secondary | ICD-10-CM | POA: Diagnosis not present

## 2017-08-03 DIAGNOSIS — E039 Hypothyroidism, unspecified: Secondary | ICD-10-CM | POA: Diagnosis not present

## 2017-08-03 DIAGNOSIS — I5032 Chronic diastolic (congestive) heart failure: Secondary | ICD-10-CM | POA: Diagnosis not present

## 2017-08-06 ENCOUNTER — Encounter: Payer: Self-pay | Admitting: Family

## 2017-08-06 ENCOUNTER — Encounter

## 2017-08-06 ENCOUNTER — Ambulatory Visit (INDEPENDENT_AMBULATORY_CARE_PROVIDER_SITE_OTHER): Payer: Medicare Other | Admitting: Family

## 2017-08-06 ENCOUNTER — Ambulatory Visit: Payer: Self-pay | Admitting: Nurse Practitioner

## 2017-08-06 VITALS — BP 124/82 | HR 92 | Temp 99.3°F | Ht 63.0 in | Wt 199.1 lb

## 2017-08-06 DIAGNOSIS — E039 Hypothyroidism, unspecified: Secondary | ICD-10-CM

## 2017-08-06 DIAGNOSIS — H6122 Impacted cerumen, left ear: Secondary | ICD-10-CM | POA: Diagnosis not present
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
## 2017-08-07 DIAGNOSIS — H548 Legal blindness, as defined in USA: Secondary | ICD-10-CM | POA: Diagnosis not present

## 2017-08-07 DIAGNOSIS — E785 Hyperlipidemia, unspecified: Secondary | ICD-10-CM | POA: Diagnosis not present

## 2017-08-07 DIAGNOSIS — J439 Emphysema, unspecified: Secondary | ICD-10-CM | POA: Diagnosis not present

## 2017-08-07 DIAGNOSIS — I5032 Chronic diastolic (congestive) heart failure: Secondary | ICD-10-CM | POA: Diagnosis not present

## 2017-08-07 DIAGNOSIS — M6281 Muscle weakness (generalized): Secondary | ICD-10-CM | POA: Diagnosis not present

## 2017-08-07 DIAGNOSIS — I480 Paroxysmal atrial fibrillation: Secondary | ICD-10-CM | POA: Diagnosis not present

## 2017-08-07 DIAGNOSIS — G894 Chronic pain syndrome: Secondary | ICD-10-CM | POA: Diagnosis not present

## 2017-08-07 DIAGNOSIS — I110 Hypertensive heart disease with heart failure: Secondary | ICD-10-CM | POA: Diagnosis not present

## 2017-08-07 DIAGNOSIS — I2510 Atherosclerotic heart disease of native coronary artery without angina pectoris: Secondary | ICD-10-CM | POA: Diagnosis not present

## 2017-08-07 DIAGNOSIS — E039 Hypothyroidism, unspecified: Secondary | ICD-10-CM | POA: Diagnosis not present

## 2017-08-07 DIAGNOSIS — M48061 Spinal stenosis, lumbar region without neurogenic claudication: Secondary | ICD-10-CM | POA: Diagnosis not present

## 2017-08-07 DIAGNOSIS — I69354 Hemiplegia and hemiparesis following cerebral infarction affecting left non-dominant side: Secondary | ICD-10-CM | POA: Diagnosis not present

## 2017-08-07 DIAGNOSIS — E119 Type 2 diabetes mellitus without complications: Secondary | ICD-10-CM | POA: Diagnosis not present

## 2017-08-07 DIAGNOSIS — G4733 Obstructive sleep apnea (adult) (pediatric): Secondary | ICD-10-CM | POA: Diagnosis not present

## 2017-08-10 DIAGNOSIS — G894 Chronic pain syndrome: Secondary | ICD-10-CM | POA: Diagnosis not present

## 2017-08-10 DIAGNOSIS — I5032 Chronic diastolic (congestive) heart failure: Secondary | ICD-10-CM | POA: Diagnosis not present

## 2017-08-10 DIAGNOSIS — M6281 Muscle weakness (generalized): Secondary | ICD-10-CM | POA: Diagnosis not present

## 2017-08-10 DIAGNOSIS — I2510 Atherosclerotic heart disease of native coronary artery without angina pectoris: Secondary | ICD-10-CM | POA: Diagnosis not present

## 2017-08-10 DIAGNOSIS — E785 Hyperlipidemia, unspecified: Secondary | ICD-10-CM | POA: Diagnosis not present

## 2017-08-10 DIAGNOSIS — J439 Emphysema, unspecified: Secondary | ICD-10-CM | POA: Diagnosis not present

## 2017-08-10 DIAGNOSIS — E039 Hypothyroidism, unspecified: Secondary | ICD-10-CM | POA: Diagnosis not present

## 2017-08-10 DIAGNOSIS — E119 Type 2 diabetes mellitus without complications: Secondary | ICD-10-CM | POA: Diagnosis not present

## 2017-08-10 DIAGNOSIS — I480 Paroxysmal atrial fibrillation: Secondary | ICD-10-CM | POA: Diagnosis not present

## 2017-08-10 DIAGNOSIS — I69354 Hemiplegia and hemiparesis following cerebral infarction affecting left non-dominant side: Secondary | ICD-10-CM | POA: Diagnosis not present

## 2017-08-10 DIAGNOSIS — H548 Legal blindness, as defined in USA: Secondary | ICD-10-CM | POA: Diagnosis not present

## 2017-08-10 DIAGNOSIS — I110 Hypertensive heart disease with heart failure: Secondary | ICD-10-CM | POA: Diagnosis not present

## 2017-08-10 DIAGNOSIS — G4733 Obstructive sleep apnea (adult) (pediatric): Secondary | ICD-10-CM | POA: Diagnosis not present

## 2017-08-10 DIAGNOSIS — M48061 Spinal stenosis, lumbar region without neurogenic claudication: Secondary | ICD-10-CM | POA: Diagnosis not present

## 2017-08-11 DIAGNOSIS — M48061 Spinal stenosis, lumbar region without neurogenic claudication: Secondary | ICD-10-CM | POA: Diagnosis not present

## 2017-08-11 DIAGNOSIS — I2510 Atherosclerotic heart disease of native coronary artery without angina pectoris: Secondary | ICD-10-CM | POA: Diagnosis not present

## 2017-08-11 DIAGNOSIS — G4733 Obstructive sleep apnea (adult) (pediatric): Secondary | ICD-10-CM | POA: Diagnosis not present

## 2017-08-11 DIAGNOSIS — E119 Type 2 diabetes mellitus without complications: Secondary | ICD-10-CM | POA: Diagnosis not present

## 2017-08-11 DIAGNOSIS — J439 Emphysema, unspecified: Secondary | ICD-10-CM | POA: Diagnosis not present

## 2017-08-11 DIAGNOSIS — E039 Hypothyroidism, unspecified: Secondary | ICD-10-CM | POA: Diagnosis not present

## 2017-08-11 DIAGNOSIS — M6281 Muscle weakness (generalized): Secondary | ICD-10-CM | POA: Diagnosis not present

## 2017-08-11 DIAGNOSIS — E785 Hyperlipidemia, unspecified: Secondary | ICD-10-CM | POA: Diagnosis not present

## 2017-08-11 DIAGNOSIS — I110 Hypertensive heart disease with heart failure: Secondary | ICD-10-CM | POA: Diagnosis not present

## 2017-08-11 DIAGNOSIS — I69354 Hemiplegia and hemiparesis following cerebral infarction affecting left non-dominant side: Secondary | ICD-10-CM | POA: Diagnosis not present

## 2017-08-11 DIAGNOSIS — G894 Chronic pain syndrome: Secondary | ICD-10-CM | POA: Diagnosis not present

## 2017-08-11 DIAGNOSIS — I5032 Chronic diastolic (congestive) heart failure: Secondary | ICD-10-CM | POA: Diagnosis not present

## 2017-08-11 DIAGNOSIS — H548 Legal blindness, as defined in USA: Secondary | ICD-10-CM | POA: Diagnosis not present

## 2017-08-11 DIAGNOSIS — I480 Paroxysmal atrial fibrillation: Secondary | ICD-10-CM | POA: Diagnosis not present

## 2017-08-14 ENCOUNTER — Encounter

## 2017-08-24 DIAGNOSIS — G894 Chronic pain syndrome: Secondary | ICD-10-CM | POA: Diagnosis not present

## 2017-08-24 DIAGNOSIS — M545 Low back pain: Secondary | ICD-10-CM | POA: Diagnosis not present

## 2017-08-24 DIAGNOSIS — M961 Postlaminectomy syndrome, not elsewhere classified: Secondary | ICD-10-CM | POA: Diagnosis not present

## 2017-08-24 DIAGNOSIS — Z79891 Long term (current) use of opiate analgesic: Secondary | ICD-10-CM | POA: Diagnosis not present

## 2017-08-31 ENCOUNTER — Other Ambulatory Visit: Payer: Self-pay | Admitting: Family

## 2017-08-31 MED ORDER — LORATADINE 10 MG PO TABS: 10.0000 mg | ORAL_TABLET | Freq: Every day | ORAL | 2 refills | Status: DC

## 2017-08-31 NOTE — Telephone Encounter (Signed)
Copied from [place] #[id number]. Topic: Quick Communication - Rx Refill/Question >> Aug 31, 2017  2:42 PM Gardiner Ramus wrote: Medication: zolpidem (AMBIEN) 5 MG tablet [[id number]] loratadine (CLARITIN) 10 MG tablet [[id number]]   Preferred Pharmacy (with phone number or street name): [place], [place] [phone] (Phone) [phone] (Fax)  Agent: Please be advised that RX refills may take up to 3 business days. We ask that you follow-up with your pharmacy.

## 2017-08-31 NOTE — Telephone Encounter (Signed)
Zolpidem refill Last Refill:07/23/17 #30 Last OV: 05/13/17 PCP: [place]: [facility]

## 2017-09-01 ENCOUNTER — Other Ambulatory Visit: Payer: Self-pay | Admitting: Family

## 2017-09-02 ENCOUNTER — Ambulatory Visit: Payer: Self-pay | Admitting: Nurse Practitioner

## 2017-09-02 ENCOUNTER — Encounter: Payer: Self-pay | Admitting: Nurse Practitioner

## 2017-09-02 MED ORDER — ZOLPIDEM TARTRATE 5 MG PO TABS: 5.0000 mg | ORAL_TABLET | Freq: Every evening | ORAL | 1 refills | Status: DC | PRN

## 2017-09-02 NOTE — Telephone Encounter (Signed)
Pt checking status on her zolpidem (AMBIEN) 5 MG tablet  Refill.

## 2017-09-02 NOTE — Telephone Encounter (Signed)
Please advise 

## 2017-09-08 ENCOUNTER — Telehealth: Payer: Self-pay

## 2017-09-08 NOTE — Telephone Encounter (Signed)
Spoke with patient and she was unsure who her psychiatrist was. She gave me number to reach Oley Balm her RN  @ [phone] to see if she could provide the name.

## 2017-09-08 NOTE — Telephone Encounter (Signed)
Due to her psychiatric needs, I want her psychiatrist to send me a letter stating they are okay with this. Thanks-

## 2017-09-08 NOTE — Telephone Encounter (Signed)
Copied from [place] [phone]. Topic: General - Other >> Sep 08, 2017  4:04 PM Carolyn Stare wrote:  Pt call to ask if her pcp will call her in Ch[place]ix to help her quit smoking , pt said she spoke with her insurance company and they will cover  [employer]

## 2017-09-10 NOTE — Telephone Encounter (Signed)
Kelsey Hanna returned your call. Patient's psychiatrist is Snezana Cvejin. Her office # is [phone] and she is in the office Tuesdays/Thursdays. Her psychiatrist is aware of the previous Chantix and is [place] with it.

## 2017-09-10 NOTE — Telephone Encounter (Signed)
Called again and left message for Oley Balm to return call with requested info.

## 2017-09-11 ENCOUNTER — Other Ambulatory Visit: Payer: Self-pay | Admitting: Family

## 2017-09-11 MED ORDER — VARENICLINE TARTRATE 0.5 MG PO TABS: ORAL_TABLET | ORAL | 0 refills | Status: DC

## 2017-09-17 ENCOUNTER — Telehealth: Payer: Self-pay | Admitting: Family

## 2017-09-17 NOTE — Telephone Encounter (Signed)
Copied from [place] [phone]. Topic: Quick Communication - Rx Refill/Question >> Sep 17, 2017  1:54 PM Keene Breath wrote: Medication: varenicline (CHANTIX) 0.5 MG tablet  Patient called to request a refill for the above medication.  CB# [phone]  Preferred Pharmacy (with phone number or street name): [place], [place] [phone] (Phone) [phone] (Fax)

## 2017-09-24 ENCOUNTER — Other Ambulatory Visit: Payer: Self-pay | Admitting: Family

## 2017-09-30 ENCOUNTER — Other Ambulatory Visit: Payer: Self-pay | Admitting: Family

## 2017-09-30 NOTE — Telephone Encounter (Signed)
Check [place] registry last filled 09/25/2017.Marland KitchenJohny Chess

## 2017-09-30 NOTE — Telephone Encounter (Signed)
Copied from [place] [id number]. Topic: Quick Communication - Rx Refill/Question >> Sep 30, 2017 12:13 PM Keene Breath wrote: Medication: zolpidem (AMBIEN) 5 MG tablet  Patient called to request a refill for the above medication.  CB# [phone]  Preferred Pharmacy (with phone number or street name): [place], [place] [phone] (Phone) [phone] (Fax)

## 2017-09-30 NOTE — Telephone Encounter (Signed)
Rx refill request: Ambien 5 mg       Last filled: 09/02/17  LOV: 08/06/17  PCP: [place]: verified

## 2017-10-12 ENCOUNTER — Other Ambulatory Visit: Payer: Self-pay | Admitting: Family

## 2017-10-12 DIAGNOSIS — M533 Sacrococcygeal disorders, not elsewhere classified: Secondary | ICD-10-CM | POA: Diagnosis not present

## 2017-10-12 DIAGNOSIS — Z79891 Long term (current) use of opiate analgesic: Secondary | ICD-10-CM | POA: Diagnosis not present

## 2017-10-12 DIAGNOSIS — G894 Chronic pain syndrome: Secondary | ICD-10-CM | POA: Diagnosis not present

## 2017-10-12 DIAGNOSIS — M545 Low back pain: Secondary | ICD-10-CM | POA: Diagnosis not present

## 2017-10-12 DIAGNOSIS — M961 Postlaminectomy syndrome, not elsewhere classified: Secondary | ICD-10-CM | POA: Diagnosis not present

## 2017-10-27 ENCOUNTER — Other Ambulatory Visit: Payer: Self-pay | Admitting: Family

## 2017-10-27 DIAGNOSIS — K5900 Constipation, unspecified: Secondary | ICD-10-CM

## 2017-11-01 ENCOUNTER — Encounter (HOSPITAL_COMMUNITY): Payer: Self-pay | Admitting: *Deleted

## 2017-11-01 ENCOUNTER — Emergency Department (HOSPITAL_COMMUNITY): Payer: Medicare Other

## 2017-11-01 ENCOUNTER — Emergency Department (HOSPITAL_COMMUNITY)
Admission: EM | Admit: 2017-11-01 | Discharge: 2017-11-01 | Disposition: A | Discharge status: Home / Self Care | Payer: Medicare Other | Attending: Emergency Medicine | Admitting: Emergency Medicine

## 2017-11-01 ENCOUNTER — Other Ambulatory Visit: Payer: Self-pay

## 2017-11-01 DIAGNOSIS — Z8542 Personal history of malignant neoplasm of other parts of uterus: Secondary | ICD-10-CM | POA: Insufficient documentation

## 2017-11-01 DIAGNOSIS — M5489 Other dorsalgia: Secondary | ICD-10-CM | POA: Diagnosis not present

## 2017-11-01 DIAGNOSIS — E119 Type 2 diabetes mellitus without complications: Secondary | ICD-10-CM | POA: Insufficient documentation

## 2017-11-01 DIAGNOSIS — Z79899 Other long term (current) drug therapy: Secondary | ICD-10-CM | POA: Diagnosis not present

## 2017-11-01 DIAGNOSIS — Z7982 Long term (current) use of aspirin: Secondary | ICD-10-CM | POA: Diagnosis not present

## 2017-11-01 DIAGNOSIS — Y998 Other external cause status: Secondary | ICD-10-CM | POA: Diagnosis not present

## 2017-11-01 DIAGNOSIS — W228XXA Striking against or struck by other objects, initial encounter: Secondary | ICD-10-CM | POA: Insufficient documentation

## 2017-11-01 DIAGNOSIS — I5032 Chronic diastolic (congestive) heart failure: Secondary | ICD-10-CM | POA: Insufficient documentation

## 2017-11-01 DIAGNOSIS — S99922A Unspecified injury of left foot, initial encounter: Secondary | ICD-10-CM | POA: Diagnosis present

## 2017-11-01 DIAGNOSIS — I110 Hypertensive heart disease with heart failure: Secondary | ICD-10-CM | POA: Insufficient documentation

## 2017-11-01 DIAGNOSIS — F419 Anxiety disorder, unspecified: Secondary | ICD-10-CM | POA: Diagnosis not present

## 2017-11-01 DIAGNOSIS — E039 Hypothyroidism, unspecified: Secondary | ICD-10-CM | POA: Diagnosis not present

## 2017-11-01 DIAGNOSIS — Y9389 Activity, other specified: Secondary | ICD-10-CM | POA: Diagnosis not present

## 2017-11-01 DIAGNOSIS — Z8673 Personal history of transient ischemic attack (TIA), and cerebral infarction without residual deficits: Secondary | ICD-10-CM | POA: Diagnosis not present

## 2017-11-01 DIAGNOSIS — F319 Bipolar disorder, unspecified: Secondary | ICD-10-CM | POA: Insufficient documentation

## 2017-11-01 DIAGNOSIS — F17210 Nicotine dependence, cigarettes, uncomplicated: Secondary | ICD-10-CM | POA: Insufficient documentation

## 2017-11-01 DIAGNOSIS — Y92003 Bedroom of unspecified non-institutional (private) residence as the place of occurrence of the external cause: Secondary | ICD-10-CM | POA: Diagnosis not present

## 2017-11-01 DIAGNOSIS — Z7902 Long term (current) use of antithrombotics/antiplatelets: Secondary | ICD-10-CM | POA: Insufficient documentation

## 2017-11-01 DIAGNOSIS — M545 Low back pain: Secondary | ICD-10-CM | POA: Diagnosis not present

## 2017-11-01 DIAGNOSIS — S9032XA Contusion of left foot, initial encounter: Secondary | ICD-10-CM | POA: Diagnosis not present

## 2017-11-01 NOTE — ED Triage Notes (Signed)
Per [facility], pt was walking & hit left foot on bed post.  C/o increased back pain, hx of chronic back pain.  Also c/o left great toe & left ankle pain.

## 2017-11-01 NOTE — Discharge Instructions (Addendum)
Use ice on the sore spots 3 times a day for 2 days after that use heat.  See your doctor as needed for problems.  If you need to, for pain, you can use Tylenol.

## 2017-11-01 NOTE — ED Notes (Signed)
Pt Refusing xray of foot.  Wants dressing only.  No wound noted on foot.  Placed coban dressing per patient desire.  Pt attempting to find ride home for d/c and is wanting to look into cab.  Caregiver is calling back.
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
## 2017-11-09 ENCOUNTER — Other Ambulatory Visit: Payer: Self-pay | Admitting: Family

## 2017-11-09 DIAGNOSIS — M545 Low back pain: Secondary | ICD-10-CM | POA: Diagnosis not present

## 2017-11-09 DIAGNOSIS — G894 Chronic pain syndrome: Secondary | ICD-10-CM | POA: Diagnosis not present

## 2017-11-09 DIAGNOSIS — M961 Postlaminectomy syndrome, not elsewhere classified: Secondary | ICD-10-CM | POA: Diagnosis not present

## 2017-11-09 DIAGNOSIS — M533 Sacrococcygeal disorders, not elsewhere classified: Secondary | ICD-10-CM | POA: Diagnosis not present

## 2017-11-09 DIAGNOSIS — Z79891 Long term (current) use of opiate analgesic: Secondary | ICD-10-CM | POA: Diagnosis not present

## 2017-11-11 ENCOUNTER — Encounter (HOSPITAL_COMMUNITY): Payer: Self-pay | Admitting: Emergency Medicine

## 2017-11-11 ENCOUNTER — Ambulatory Visit: Payer: Self-pay | Admitting: Family

## 2017-11-11 ENCOUNTER — Emergency Department (HOSPITAL_COMMUNITY)
Admission: EM | Admit: 2017-11-11 | Discharge: 2017-11-14 | Disposition: A | Discharge status: Other Acute Inpatient Hospital | Payer: Medicare Other | Attending: Emergency Medicine | Admitting: Emergency Medicine

## 2017-11-11 ENCOUNTER — Emergency Department (HOSPITAL_COMMUNITY): Payer: Medicare Other

## 2017-11-11 DIAGNOSIS — R410 Disorientation, unspecified: Secondary | ICD-10-CM | POA: Diagnosis not present

## 2017-11-11 DIAGNOSIS — F329 Major depressive disorder, single episode, unspecified: Secondary | ICD-10-CM | POA: Diagnosis not present

## 2017-11-11 DIAGNOSIS — Z7902 Long term (current) use of antithrombotics/antiplatelets: Secondary | ICD-10-CM | POA: Insufficient documentation

## 2017-11-11 DIAGNOSIS — E039 Hypothyroidism, unspecified: Secondary | ICD-10-CM | POA: Diagnosis not present

## 2017-11-11 DIAGNOSIS — F310 Bipolar disorder, current episode hypomanic: Secondary | ICD-10-CM | POA: Diagnosis not present

## 2017-11-11 DIAGNOSIS — R4182 Altered mental status, unspecified: Secondary | ICD-10-CM | POA: Diagnosis present

## 2017-11-11 DIAGNOSIS — I110 Hypertensive heart disease with heart failure: Secondary | ICD-10-CM | POA: Insufficient documentation

## 2017-11-11 DIAGNOSIS — E119 Type 2 diabetes mellitus without complications: Secondary | ICD-10-CM | POA: Diagnosis not present

## 2017-11-11 DIAGNOSIS — Z008 Encounter for other general examination: Secondary | ICD-10-CM | POA: Diagnosis not present

## 2017-11-11 DIAGNOSIS — Z743 Need for continuous supervision: Secondary | ICD-10-CM | POA: Diagnosis not present

## 2017-11-11 DIAGNOSIS — R0902 Hypoxemia: Secondary | ICD-10-CM | POA: Diagnosis not present

## 2017-11-11 DIAGNOSIS — Z7982 Long term (current) use of aspirin: Secondary | ICD-10-CM | POA: Insufficient documentation

## 2017-11-11 DIAGNOSIS — I5032 Chronic diastolic (congestive) heart failure: Secondary | ICD-10-CM | POA: Diagnosis not present

## 2017-11-11 DIAGNOSIS — F17210 Nicotine dependence, cigarettes, uncomplicated: Secondary | ICD-10-CM | POA: Diagnosis not present

## 2017-11-11 DIAGNOSIS — Z0289 Encounter for other administrative examinations: Secondary | ICD-10-CM

## 2017-11-11 DIAGNOSIS — R441 Visual hallucinations: Secondary | ICD-10-CM | POA: Diagnosis not present

## 2017-11-11 DIAGNOSIS — Z79899 Other long term (current) drug therapy: Secondary | ICD-10-CM | POA: Insufficient documentation

## 2017-11-11 DIAGNOSIS — J9811 Atelectasis: Secondary | ICD-10-CM | POA: Diagnosis not present

## 2017-11-11 DIAGNOSIS — Z8542 Personal history of malignant neoplasm of other parts of uterus: Secondary | ICD-10-CM | POA: Insufficient documentation

## 2017-11-11 DIAGNOSIS — Z8673 Personal history of transient ischemic attack (TIA), and cerebral infarction without residual deficits: Secondary | ICD-10-CM | POA: Insufficient documentation

## 2017-11-11 DIAGNOSIS — R404 Transient alteration of awareness: Secondary | ICD-10-CM | POA: Diagnosis not present

## 2017-11-11 LAB — URINALYSIS, COMPLETE (UACMP) WITH MICROSCOPIC
GLUCOSE, UA: NEGATIVE mg/dL
HGB URINE DIPSTICK: NEGATIVE
KETONES UR: 15 mg/dL — AB
Leukocytes, UA: NEGATIVE
NITRITE: NEGATIVE
PROTEIN: NEGATIVE mg/dL
RBC / HPF: NONE SEEN RBC/hpf (ref 0–5)
Specific Gravity, Urine: 1.025 (ref 1.005–1.030)
pH: 6 (ref 5.0–8.0)

## 2017-11-11 LAB — COMPREHENSIVE METABOLIC PANEL
ALBUMIN: 3.8 g/dL (ref 3.5–5.0)
ALT: 33 U/L (ref 0–44)
AST: 27 U/L (ref 15–41)
Alkaline Phosphatase: 70 U/L (ref 38–126)
Anion gap: 12 (ref 5–15)
BUN: 19 mg/dL (ref 8–23)
CHLORIDE: 108 mmol/L (ref 98–111)
CO2: 23 mmol/L (ref 22–32)
CREATININE: 0.68 mg/dL (ref 0.44–1.00)
Calcium: 9.6 mg/dL (ref 8.9–10.3)
GFR calc Af Amer: >60 mL/min (ref >60)
GLUCOSE: 119 mg/dL — AB (ref 70–99)
POTASSIUM: 3.8 mmol/L (ref 3.5–5.1)
SODIUM: 143 mmol/L (ref 135–145)
Total Bilirubin: 0.9 mg/dL (ref 0.3–1.2)
Total Protein: 7.2 g/dL (ref 6.5–8.1)

## 2017-11-11 LAB — CBC WITH DIFFERENTIAL/PLATELET
Abs Immature Granulocytes: 0 10*3/uL (ref 0.0–0.1)
Basophils Absolute: 0 10*3/uL (ref 0.0–0.1)
Basophils Relative: 0 %
Eosinophils Absolute: 0 10*3/uL (ref 0.0–0.7)
Eosinophils Relative: 0 %
HEMATOCRIT: 43.3 % (ref 36.0–46.0)
HEMOGLOBIN: 13.6 g/dL (ref 12.0–15.0)
Immature Granulocytes: 0 %
LYMPHS ABS: 0.7 10*3/uL (ref 0.7–4.0)
LYMPHS PCT: 7 %
MCH: 28.8 pg (ref 26.0–34.0)
MCHC: 31.4 g/dL (ref 30.0–36.0)
MCV: 91.7 fL (ref 78.0–100.0)
MONO ABS: 0.3 10*3/uL (ref 0.1–1.0)
MONOS PCT: 3 %
Neutro Abs: 8.6 10*3/uL — ABNORMAL HIGH (ref 1.7–7.7)
Neutrophils Relative %: 90 %
Platelets: 318 10*3/uL (ref 150–400)
RBC: 4.72 MIL/uL (ref 3.87–5.11)
RDW: 14.6 % (ref 11.5–15.5)
WBC: 9.6 10*3/uL (ref 4.0–10.5)

## 2017-11-11 LAB — PROTIME-INR
INR: 1.1
Prothrombin Time: 14.1 seconds (ref 11.4–15.2)

## 2017-11-11 LAB — ETHANOL

## 2017-11-11 LAB — CBG MONITORING, ED
Glucose-Capillary: 77 mg/dL (ref 70–99)
Glucose-Capillary: 74 mg/dL (ref 70–99)
Glucose-Capillary: 80 mg/dL (ref 70–99)

## 2017-11-11 MED ORDER — OXYCODONE ER 9 MG PO C12A: 9.0000 mg | EXTENDED_RELEASE_CAPSULE | Freq: Two times a day (BID) | ORAL | Status: DC

## 2017-11-11 MED ORDER — OLANZAPINE 10 MG PO TABS
20.0000 mg | ORAL_TABLET | Freq: Every day | ORAL | Status: DC
  Administered 2017-11-11 – 2017-11-13 (×3): 20 mg via ORAL
  Filled 2017-11-11: qty 2
  Filled 2017-11-11 (×2): qty 4
  Filled 2017-11-11 (×2): qty 2

## 2017-11-11 MED ORDER — ACETAMINOPHEN 325 MG PO TABS: 650.0000 mg | ORAL_TABLET | ORAL | Status: DC | PRN

## 2017-11-11 MED ORDER — LORATADINE 10 MG PO TABS
10.0000 mg | ORAL_TABLET | Freq: Every day | ORAL | Status: DC
  Administered 2017-11-12 – 2017-11-14 (×3): 10 mg via ORAL
  Filled 2017-11-11 (×3): qty 1

## 2017-11-11 MED ORDER — CLOPIDOGREL BISULFATE 75 MG PO TABS
37.5000 mg | ORAL_TABLET | Freq: Every day | ORAL | Status: DC
  Administered 2017-11-12 – 2017-11-14 (×3): 37.5 mg via ORAL
  Filled 2017-11-11 (×3): qty 1

## 2017-11-11 MED ORDER — OXYCODONE HCL 5 MG PO TABS
5.0000 mg | ORAL_TABLET | Freq: Four times a day (QID) | ORAL | Status: DC
  Administered 2017-11-12 – 2017-11-14 (×6): 5 mg via ORAL
  Filled 2017-11-11 (×6): qty 1

## 2017-11-11 MED ORDER — GABAPENTIN 100 MG PO CAPS
100.0000 mg | ORAL_CAPSULE | Freq: Three times a day (TID) | ORAL | Status: DC
  Administered 2017-11-11 – 2017-11-14 (×8): 100 mg via ORAL
  Filled 2017-11-11 (×8): qty 1

## 2017-11-11 MED ORDER — LEVOTHYROXINE SODIUM 150 MCG PO TABS
150.0000 ug | ORAL_TABLET | Freq: Every day | ORAL | Status: DC
  Administered 2017-11-12 – 2017-11-14 (×3): 150 ug via ORAL
  Filled 2017-11-11 (×3): qty 1

## 2017-11-11 MED ORDER — CLONAZEPAM 0.5 MG PO TABS
0.5000 mg | ORAL_TABLET | Freq: Two times a day (BID) | ORAL | Status: DC
  Administered 2017-11-11 – 2017-11-14 (×6): 0.5 mg via ORAL
  Filled 2017-11-11 (×6): qty 1

## 2017-11-11 MED ORDER — ONDANSETRON HCL 4 MG PO TABS: 4.0000 mg | ORAL_TABLET | Freq: Three times a day (TID) | ORAL | Status: DC | PRN

## 2017-11-11 MED ORDER — LINACLOTIDE 145 MCG PO CAPS
145.0000 ug | ORAL_CAPSULE | Freq: Every day | ORAL | Status: DC
  Administered 2017-11-13: 145 ug via ORAL
  Filled 2017-11-11 (×4): qty 1

## 2017-11-11 MED ORDER — FLUOXETINE HCL 20 MG PO CAPS
20.0000 mg | ORAL_CAPSULE | Freq: Every day | ORAL | Status: DC
  Administered 2017-11-12 – 2017-11-14 (×3): 20 mg via ORAL
  Filled 2017-11-11 (×3): qty 1

## 2017-11-11 NOTE — ED Triage Notes (Signed)
Pt here from home with c/io aloc , cbg 143 , pt had her clothes on backwards and trying to cook with a plastic pot on the stove

## 2017-11-11 NOTE — ED Notes (Signed)
Pt keeps talking to all her surroundings

## 2017-11-11 NOTE — ED Notes (Signed)
Patient transported to X-ray 
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
## 2017-11-11 NOTE — ED Notes (Signed)
Registration found a number for the daughter  Maybe   Number called  Answering machine  Picked up  Left a message there

## 2017-11-11 NOTE — ED Notes (Signed)
Pt reports that she has to go to the br   Small brown stool cleaned from the pt  She reports now that she has a mother and sister and a daughter that lives close to her
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
## 2017-11-11 NOTE — ED Notes (Signed)
She does not know the day of the week or where she is now no month she thinks its 1792 does not know the presidents name

## 2017-11-11 NOTE — ED Notes (Signed)
CBG was 77. Gave pt water. Pt tolerated well.

## 2017-11-11 NOTE — ED Notes (Signed)
confused

## 2017-11-11 NOTE — ED Notes (Signed)
Pt pulling off leads talking to herself , stating that she is talking to her "shadow " MD notified

## 2017-11-11 NOTE — ED Notes (Signed)
Pt came from b hw  With iv still in place lt a-c  Talking to people not present

## 2017-11-11 NOTE — ED Notes (Signed)
The pt has been eating for the past hour and continues to do so.  Very slowly

## 2017-11-11 NOTE — ED Notes (Signed)
The pt reports that she took her daily meds  Before she came today

## 2017-11-11 NOTE — ED Notes (Signed)
Clothes inventoried and placed in locker number 3  One earring given to security.  Pt not wearing another earring

## 2017-11-11 NOTE — ED Notes (Signed)
Pt talking to someone in the room  No one is in the room with  No attempt to get out of bed

## 2017-11-11 NOTE — ED Notes (Signed)
Pt states "I need to get up to go to the bathroom". Pt placed on bedpan. Pt rolls in bed with no problems

## 2017-11-12 ENCOUNTER — Encounter (HOSPITAL_COMMUNITY): Payer: Self-pay | Admitting: Registered Nurse

## 2017-11-12 DIAGNOSIS — F310 Bipolar disorder, current episode hypomanic: Secondary | ICD-10-CM | POA: Diagnosis not present

## 2017-11-12 LAB — CBG MONITORING, ED: Glucose-Capillary: 102 mg/dL — ABNORMAL HIGH (ref 70–99)

## 2017-11-12 LAB — URINE CULTURE: Culture: NO GROWTH

## 2017-11-12 NOTE — ED Notes (Signed)
The pt fell alseep 15 minutes ago  Awakened to take med she went right back to sleep

## 2017-11-12 NOTE — BH Assessment (Signed)
Met with pt for reassessment. Pt presents dressed in hospital gown lying in bed. She reports feeling tired and appears drowsy. When asked where pt was, she stated "[facility] is beautiful". Pt said the year was 2019 & the president is Sandy Salaam. When asked to repeat president's name, pt smiled slightly and repeated Sandy Salaam. Pt unaware at this time she is in the hospital. She continues to meet inpt criteria. Pt gave verbal permission to speak with her daughter Sharyn Lull to update on care and treatment.

## 2017-11-12 NOTE — ED Notes (Signed)
The pt slept maybe 20 minutes all night  She is talking to people nto in the room  constantly

## 2017-11-12 NOTE — ED Notes (Signed)
The pt has gotten quiet again

## 2017-11-12 NOTE — ED Notes (Signed)
Pt resting quietly at this time.  No complaints voiced

## 2017-11-12 NOTE — ED Notes (Signed)
The pt is fully alert she has taken her clothes off for the second time  She is loud yelling at someone she thinks is in her room.  She gets quiet for awhile then starts again

## 2017-11-12 NOTE — ED Notes (Signed)
[phone] Lull, daughter would like an update on the patients status

## 2017-11-12 NOTE — Consult Note (Signed)
  Medication consult/review  Kelsey Hanna, 67 y.o., female with history of bipolar disorder, anxiety, and depression was brought to [facility] with complaints of alter mental status; and home aid finding patient with clothing inside/out cooking with plastic utensils; and talking to dead family members. Patient's current home psychotropic medications are:    Prozac 20 mg daily Neurontin 100 mg Tid Zyprexa 20 mg Q hs Klonopin 0.5 mg Bid Ambien 5 mg Q hs prn  Patient also prescribed pain medication Xtampza ER 9 mg Q 12 hours Oxycodone 10 mg Q 6 hr prn   Medication recommendation:  No changes to current psychotropic medications related to not know if patient has been adherent to medications at home.  Patient is taking multiple medications that could depress CNS or altered mental status.  May want to get rid of medications not needed; or a possible alternative  (Benzodiazepine and opiates).  Will continue to seek gero-psych inpatient treatment.    Spoke with Dr. Sedonia Small; informed of above medication recommendation  Shuvon B. Rankin, NP

## 2017-11-12 NOTE — Progress Notes (Signed)
Pt. meets criteria for inpatient treatment per Lindon Romp, NP.  Referred out to the following hospitals: [facility]  [facility]     Disposition CSW will continue to follow for placement.  Areatha Keas. Judi Cong, MSW, [place] Disposition Clinical Social Work [phone] (cell) [phone] (office)

## 2017-11-12 NOTE — ED Notes (Signed)
The pt is wide awake again large brown loose stool cleaned from the pt.  All linen  changed

## 2017-11-12 NOTE — ED Notes (Signed)
Regular Diet was ordered for Breakfast. 

## 2017-11-13 DIAGNOSIS — F310 Bipolar disorder, current episode hypomanic: Secondary | ICD-10-CM | POA: Diagnosis not present

## 2017-11-13 LAB — CBG MONITORING, ED: GLUCOSE-CAPILLARY: 81 mg/dL (ref 70–99)

## 2017-11-13 LAB — RAPID URINE DRUG SCREEN, HOSP PERFORMED
Amphetamines: NOT DETECTED
BENZODIAZEPINES: NOT DETECTED
Barbiturates: NOT DETECTED
Cocaine: NOT DETECTED
OPIATES: NOT DETECTED
Tetrahydrocannabinol: NOT DETECTED

## 2017-11-13 NOTE — ED Notes (Signed)
Snack and drink was given at snack time.

## 2017-11-13 NOTE — Progress Notes (Signed)
Disposition CSW contacted by Lenna Sciara AC @[facility] who requests a UDS, Vitals. Information re: MRSA and whether patient is oriented  CSW contacted [facility] Psych ED with the following results:  -Patient had just voided prior to call.  RN asked to do a UDS next time patient has to urinate. -Vitals sent.  Asked if EDP could consider adding meds for BP as it has been elevated throughout stay in ED -Patient MRSA status was resolved 03/16/17 -Patient is not oriented at this time.  03/29/17. Areatha Keas, MSW, [place] Disposition Clinical Social Work [phone] (cell) [phone] (office)

## 2017-11-13 NOTE — ED Notes (Signed)
Patient having  TTS  Done at this time.

## 2017-11-13 NOTE — ED Notes (Signed)
Dinner order placed via [employer]

## 2017-11-13 NOTE — ED Notes (Signed)
Patient incont. Of urine cleaned patient and linens changed.

## 2017-11-13 NOTE — ED Notes (Signed)
Regular diet breakfast tray ordered 

## 2017-11-13 NOTE — ED Notes (Signed)
Spoke with daughter about patient status, states she will be coming later today to see patient .

## 2017-11-13 NOTE — ED Provider Notes (Signed)
Patient had been initially seen for altered mental status, medically cleared, psychiatry consult placed.  She was found appropriate for [facility] psych, and they were requesting IVC for transfer.  She has a history of bipolar, and her current disorientation makes her risk for her's risk to herself--IVC was filled out and she is awaiting transfer.   Gareth Morgan, MD 11/13/17 2350

## 2017-11-13 NOTE — ED Notes (Signed)
MD informed of pt's dribbling water out of mouth when given pill, no new neuro symptoms at this time

## 2017-11-13 NOTE — ED Notes (Addendum)
Daughter and POA Adrian Prows updated on plan of care [phone]

## 2017-11-13 NOTE — BH Assessment (Signed)
Tra[facility] Assessment Progress Note   TTS reassessment:  Patient continues to be disortiented and confused.  Patient states: "I just want silence." Patient states that she only wants her family around her "on the day that Jesus was born." Patient was not able to answer questions appropriately.  TTS spoke with patient's nurse, Claiborne Billings, who had spoken to patient's daughter who indicated that patient has been experiencing a change in mental status over the past three weeks.  Daughter states that she has seen mother like this before when she had a UTI, but states that since she does not have on on this occasion that she is not sure what is causing it.  TTS/Social Work to continue to seek placement for patient since she is not at her baseline and does not appear to be able to take care of herself on her own at this time.

## 2017-11-13 NOTE — ED Notes (Signed)
Pt noted to be intermittently laughing in her sleep. This RN checked on pt, found in NAD but continuing to laugh despite attempts to arouse, and pt responded "Yes ma'am" when asked if she was ok. Pt and linens still clean and dry at this time.

## 2017-11-13 NOTE — ED Notes (Signed)
Patient's daughter visiting at this time-Monique,RN

## 2017-11-13 NOTE — ED Notes (Signed)
Sitting up in bed still eating her lunch , states she is going to eat very slow

## 2017-11-13 NOTE — ED Notes (Signed)
Re-evaluated pt's ability to swallow via straw, no issues, MD states no formal swallow screen indicated at this time and may take POs

## 2017-11-13 NOTE — ED Notes (Signed)
Regular Diet was ordered for Lunch. 

## 2017-11-13 NOTE — Progress Notes (Signed)
Pt accepted to [facility]   Dr. Geanie Kenning, MD is the accepting/attending provider.  Call report to [phone] [place] ED notified.   Pt is to be IVC'd prior to transport.   Pt may be transported by [employer] Pt scheduled  to arrive at [facility] once IVC is complete and faxed to [place].  PT TO BE IVC'D. PLEASE FAX IVC TO [phone] PRIOR TO CALLING REPORT.  Areatha Keas. Judi Cong, MSW, [place] Disposition Clinical Social Work [phone] (cell) [phone] (office)

## 2017-11-14 ENCOUNTER — Other Ambulatory Visit: Payer: Self-pay

## 2017-11-14 DIAGNOSIS — F310 Bipolar disorder, current episode hypomanic: Secondary | ICD-10-CM | POA: Diagnosis not present

## 2017-11-14 DIAGNOSIS — M2550 Pain in unspecified joint: Secondary | ICD-10-CM | POA: Diagnosis not present

## 2017-11-14 DIAGNOSIS — R404 Transient alteration of awareness: Secondary | ICD-10-CM | POA: Diagnosis not present

## 2017-11-14 DIAGNOSIS — Z7401 Bed confinement status: Secondary | ICD-10-CM | POA: Diagnosis not present

## 2017-11-14 DIAGNOSIS — I10 Essential (primary) hypertension: Secondary | ICD-10-CM | POA: Diagnosis not present

## 2017-11-14 NOTE — ED Notes (Signed)
Left message for [facility] Deputy notifying of need for transport - will possibly need to be arranged w/PTAR d/t pt is non-ambulatory. Pt able to stand on side of bed briefly w/staff assistance x 2. NT assisting w/feeding pt breakfast.

## 2017-11-14 NOTE — ED Notes (Signed)
Deputy and PTAR transporting pt to [facility]. ALL belongings - 1 labeled belongings bag and 1 valuables envelope - deputy.

## 2017-11-14 NOTE — ED Notes (Signed)
Kelsey Hanna, aware may be delay w/transportation d/t pt non-ambulatory. Advised may call report when closer to transportation time.

## 2017-11-14 NOTE — ED Notes (Signed)
Deputy called and requesting for RN to request for PTAR to assist w/coordinating arrival time for 1130.

## 2017-11-14 NOTE — ED Notes (Signed)
I had the pleasure of assisting Mrs. Kelsey Hanna with breakfast this morning and she ate 3/4  Of her food. Pt stated that the bacon was too salty but ate everything else and drank her orange juice, her milk and about half of her coffee. Very pleasant and had much laughter during this time.

## 2017-11-14 NOTE — ED Notes (Signed)
IVC Papers has been served and faxed to [facility]; copy placed in medical records and original placed in red folder-Monique,RN

## 2017-11-15 DIAGNOSIS — G894 Chronic pain syndrome: Secondary | ICD-10-CM | POA: Diagnosis not present

## 2017-11-15 DIAGNOSIS — E785 Hyperlipidemia, unspecified: Secondary | ICD-10-CM | POA: Diagnosis not present

## 2017-11-15 DIAGNOSIS — D72829 Elevated white blood cell count, unspecified: Secondary | ICD-10-CM | POA: Diagnosis not present

## 2017-11-15 DIAGNOSIS — R918 Other nonspecific abnormal finding of lung field: Secondary | ICD-10-CM | POA: Diagnosis not present

## 2017-11-15 DIAGNOSIS — J310 Chronic rhinitis: Secondary | ICD-10-CM | POA: Diagnosis not present

## 2017-11-15 DIAGNOSIS — I10 Essential (primary) hypertension: Secondary | ICD-10-CM | POA: Diagnosis not present

## 2017-11-15 DIAGNOSIS — M109 Gout, unspecified: Secondary | ICD-10-CM | POA: Diagnosis not present

## 2017-11-16 ENCOUNTER — Ambulatory Visit: Payer: Medicare Other | Admitting: Neurology

## 2017-11-16 ENCOUNTER — Telehealth: Payer: Self-pay | Admitting: *Deleted

## 2017-11-16 DIAGNOSIS — K5909 Other constipation: Secondary | ICD-10-CM | POA: Diagnosis not present

## 2017-11-16 DIAGNOSIS — D72828 Other elevated white blood cell count: Secondary | ICD-10-CM | POA: Diagnosis not present

## 2017-11-16 DIAGNOSIS — E1159 Type 2 diabetes mellitus with other circulatory complications: Secondary | ICD-10-CM | POA: Diagnosis not present

## 2017-11-16 DIAGNOSIS — I480 Paroxysmal atrial fibrillation: Secondary | ICD-10-CM | POA: Diagnosis not present

## 2017-11-16 NOTE — Telephone Encounter (Signed)
No showed follow up appt. 

## 2017-11-17 ENCOUNTER — Encounter: Payer: Self-pay | Admitting: Neurology

## 2017-11-18 DIAGNOSIS — R410 Disorientation, unspecified: Secondary | ICD-10-CM | POA: Diagnosis not present

## 2017-11-18 DIAGNOSIS — R401 Stupor: Secondary | ICD-10-CM | POA: Diagnosis not present

## 2017-11-18 DIAGNOSIS — R9089 Other abnormal findings on diagnostic imaging of central nervous system: Secondary | ICD-10-CM | POA: Diagnosis not present

## 2017-11-18 DIAGNOSIS — I671 Cerebral aneurysm, nonruptured: Secondary | ICD-10-CM | POA: Diagnosis not present

## 2017-11-18 DIAGNOSIS — D72829 Elevated white blood cell count, unspecified: Secondary | ICD-10-CM | POA: Diagnosis not present

## 2017-11-18 DIAGNOSIS — M109 Gout, unspecified: Secondary | ICD-10-CM | POA: Diagnosis not present

## 2017-11-18 DIAGNOSIS — I480 Paroxysmal atrial fibrillation: Secondary | ICD-10-CM | POA: Diagnosis not present

## 2017-11-18 DIAGNOSIS — N3000 Acute cystitis without hematuria: Secondary | ICD-10-CM | POA: Diagnosis not present

## 2017-11-18 DIAGNOSIS — J9811 Atelectasis: Secondary | ICD-10-CM | POA: Diagnosis not present

## 2017-11-18 DIAGNOSIS — E860 Dehydration: Secondary | ICD-10-CM | POA: Diagnosis not present

## 2017-11-19 DIAGNOSIS — M109 Gout, unspecified: Secondary | ICD-10-CM | POA: Diagnosis not present

## 2017-11-19 DIAGNOSIS — I480 Paroxysmal atrial fibrillation: Secondary | ICD-10-CM | POA: Diagnosis not present

## 2017-11-19 DIAGNOSIS — E860 Dehydration: Secondary | ICD-10-CM | POA: Diagnosis not present

## 2017-11-19 DIAGNOSIS — N3000 Acute cystitis without hematuria: Secondary | ICD-10-CM | POA: Diagnosis not present

## 2017-11-19 DIAGNOSIS — D72829 Elevated white blood cell count, unspecified: Secondary | ICD-10-CM | POA: Diagnosis not present

## 2017-11-21 DIAGNOSIS — E860 Dehydration: Secondary | ICD-10-CM | POA: Diagnosis not present

## 2017-11-21 DIAGNOSIS — I480 Paroxysmal atrial fibrillation: Secondary | ICD-10-CM | POA: Diagnosis not present

## 2017-11-21 DIAGNOSIS — R5381 Other malaise: Secondary | ICD-10-CM | POA: Diagnosis not present

## 2017-11-21 DIAGNOSIS — N3000 Acute cystitis without hematuria: Secondary | ICD-10-CM | POA: Diagnosis not present

## 2017-11-21 DIAGNOSIS — D72829 Elevated white blood cell count, unspecified: Secondary | ICD-10-CM | POA: Diagnosis not present

## 2017-11-23 DIAGNOSIS — N3000 Acute cystitis without hematuria: Secondary | ICD-10-CM | POA: Diagnosis not present

## 2017-11-23 DIAGNOSIS — E1159 Type 2 diabetes mellitus with other circulatory complications: Secondary | ICD-10-CM | POA: Diagnosis not present

## 2017-11-23 DIAGNOSIS — R5381 Other malaise: Secondary | ICD-10-CM | POA: Diagnosis not present

## 2017-11-23 DIAGNOSIS — I480 Paroxysmal atrial fibrillation: Secondary | ICD-10-CM | POA: Diagnosis not present

## 2017-11-23 DIAGNOSIS — E860 Dehydration: Secondary | ICD-10-CM | POA: Diagnosis not present

## 2017-11-25 DIAGNOSIS — N3000 Acute cystitis without hematuria: Secondary | ICD-10-CM | POA: Diagnosis not present

## 2017-11-25 DIAGNOSIS — E1159 Type 2 diabetes mellitus with other circulatory complications: Secondary | ICD-10-CM | POA: Diagnosis not present

## 2017-11-25 DIAGNOSIS — E860 Dehydration: Secondary | ICD-10-CM | POA: Diagnosis not present

## 2017-11-25 DIAGNOSIS — R5381 Other malaise: Secondary | ICD-10-CM | POA: Diagnosis not present

## 2017-11-25 DIAGNOSIS — I480 Paroxysmal atrial fibrillation: Secondary | ICD-10-CM | POA: Diagnosis not present

## 2017-11-27 DIAGNOSIS — E860 Dehydration: Secondary | ICD-10-CM | POA: Diagnosis not present

## 2017-11-27 DIAGNOSIS — R5381 Other malaise: Secondary | ICD-10-CM | POA: Diagnosis not present

## 2017-11-27 DIAGNOSIS — N3000 Acute cystitis without hematuria: Secondary | ICD-10-CM | POA: Diagnosis not present

## 2017-11-27 DIAGNOSIS — I480 Paroxysmal atrial fibrillation: Secondary | ICD-10-CM | POA: Diagnosis not present

## 2017-11-27 DIAGNOSIS — E1159 Type 2 diabetes mellitus with other circulatory complications: Secondary | ICD-10-CM | POA: Diagnosis not present

## 2017-11-30 DIAGNOSIS — I480 Paroxysmal atrial fibrillation: Secondary | ICD-10-CM | POA: Diagnosis not present

## 2017-11-30 DIAGNOSIS — K5909 Other constipation: Secondary | ICD-10-CM | POA: Diagnosis not present

## 2017-11-30 DIAGNOSIS — D72829 Elevated white blood cell count, unspecified: Secondary | ICD-10-CM | POA: Diagnosis not present

## 2017-11-30 DIAGNOSIS — R5381 Other malaise: Secondary | ICD-10-CM | POA: Diagnosis not present

## 2017-11-30 DIAGNOSIS — E1159 Type 2 diabetes mellitus with other circulatory complications: Secondary | ICD-10-CM | POA: Diagnosis not present

## 2017-12-02 DIAGNOSIS — I480 Paroxysmal atrial fibrillation: Secondary | ICD-10-CM | POA: Diagnosis not present

## 2017-12-02 DIAGNOSIS — E1159 Type 2 diabetes mellitus with other circulatory complications: Secondary | ICD-10-CM | POA: Diagnosis not present

## 2017-12-02 DIAGNOSIS — J310 Chronic rhinitis: Secondary | ICD-10-CM | POA: Diagnosis not present

## 2017-12-02 DIAGNOSIS — G894 Chronic pain syndrome: Secondary | ICD-10-CM | POA: Diagnosis not present

## 2017-12-02 DIAGNOSIS — E860 Dehydration: Secondary | ICD-10-CM | POA: Diagnosis not present

## 2017-12-04 DIAGNOSIS — M109 Gout, unspecified: Secondary | ICD-10-CM | POA: Diagnosis not present

## 2017-12-04 DIAGNOSIS — J310 Chronic rhinitis: Secondary | ICD-10-CM | POA: Diagnosis not present

## 2017-12-04 DIAGNOSIS — E860 Dehydration: Secondary | ICD-10-CM | POA: Diagnosis not present

## 2017-12-04 DIAGNOSIS — G894 Chronic pain syndrome: Secondary | ICD-10-CM | POA: Diagnosis not present

## 2017-12-04 DIAGNOSIS — E7800 Pure hypercholesterolemia, unspecified: Secondary | ICD-10-CM | POA: Diagnosis not present

## 2017-12-05 DIAGNOSIS — E1159 Type 2 diabetes mellitus with other circulatory complications: Secondary | ICD-10-CM | POA: Diagnosis not present

## 2017-12-05 DIAGNOSIS — J310 Chronic rhinitis: Secondary | ICD-10-CM | POA: Diagnosis not present

## 2017-12-05 DIAGNOSIS — E860 Dehydration: Secondary | ICD-10-CM | POA: Diagnosis not present

## 2017-12-05 DIAGNOSIS — G894 Chronic pain syndrome: Secondary | ICD-10-CM | POA: Diagnosis not present

## 2017-12-05 DIAGNOSIS — I480 Paroxysmal atrial fibrillation: Secondary | ICD-10-CM | POA: Diagnosis not present

## 2017-12-07 DIAGNOSIS — E1159 Type 2 diabetes mellitus with other circulatory complications: Secondary | ICD-10-CM | POA: Diagnosis not present

## 2017-12-07 DIAGNOSIS — J310 Chronic rhinitis: Secondary | ICD-10-CM | POA: Diagnosis not present

## 2017-12-07 DIAGNOSIS — E860 Dehydration: Secondary | ICD-10-CM | POA: Diagnosis not present

## 2017-12-07 DIAGNOSIS — I480 Paroxysmal atrial fibrillation: Secondary | ICD-10-CM | POA: Diagnosis not present

## 2017-12-09 DIAGNOSIS — E1159 Type 2 diabetes mellitus with other circulatory complications: Secondary | ICD-10-CM | POA: Diagnosis not present

## 2017-12-09 DIAGNOSIS — I480 Paroxysmal atrial fibrillation: Secondary | ICD-10-CM | POA: Diagnosis not present

## 2017-12-09 DIAGNOSIS — E860 Dehydration: Secondary | ICD-10-CM | POA: Diagnosis not present

## 2017-12-09 DIAGNOSIS — K5909 Other constipation: Secondary | ICD-10-CM | POA: Diagnosis not present

## 2017-12-11 DIAGNOSIS — E1159 Type 2 diabetes mellitus with other circulatory complications: Secondary | ICD-10-CM | POA: Diagnosis not present

## 2017-12-11 DIAGNOSIS — I480 Paroxysmal atrial fibrillation: Secondary | ICD-10-CM | POA: Diagnosis not present

## 2017-12-11 DIAGNOSIS — K5909 Other constipation: Secondary | ICD-10-CM | POA: Diagnosis not present

## 2017-12-11 DIAGNOSIS — E860 Dehydration: Secondary | ICD-10-CM | POA: Diagnosis not present

## 2017-12-14 DIAGNOSIS — D649 Anemia, unspecified: Secondary | ICD-10-CM | POA: Diagnosis not present

## 2017-12-14 DIAGNOSIS — K5909 Other constipation: Secondary | ICD-10-CM | POA: Diagnosis not present

## 2017-12-14 DIAGNOSIS — E1159 Type 2 diabetes mellitus with other circulatory complications: Secondary | ICD-10-CM | POA: Diagnosis not present

## 2017-12-14 DIAGNOSIS — I480 Paroxysmal atrial fibrillation: Secondary | ICD-10-CM | POA: Diagnosis not present

## 2017-12-14 DIAGNOSIS — R2681 Unsteadiness on feet: Secondary | ICD-10-CM | POA: Diagnosis not present

## 2017-12-16 DIAGNOSIS — F5109 Other insomnia not due to a substance or known physiological condition: Secondary | ICD-10-CM | POA: Diagnosis not present

## 2017-12-16 DIAGNOSIS — Z79899 Other long term (current) drug therapy: Secondary | ICD-10-CM | POA: Diagnosis not present

## 2017-12-16 DIAGNOSIS — D6489 Other specified anemias: Secondary | ICD-10-CM | POA: Diagnosis not present

## 2017-12-16 DIAGNOSIS — G894 Chronic pain syndrome: Secondary | ICD-10-CM | POA: Diagnosis not present

## 2017-12-16 DIAGNOSIS — E039 Hypothyroidism, unspecified: Secondary | ICD-10-CM | POA: Diagnosis not present

## 2017-12-20 DIAGNOSIS — R609 Edema, unspecified: Secondary | ICD-10-CM | POA: Diagnosis not present

## 2017-12-21 ENCOUNTER — Emergency Department
Admission: EM | Admit: 2017-12-21 | Discharge: 2017-12-21 | Disposition: A | Discharge status: Skilled Nursing Facility | Payer: Medicare Other | Attending: Emergency Medicine | Admitting: Emergency Medicine

## 2017-12-21 ENCOUNTER — Other Ambulatory Visit: Payer: Self-pay

## 2017-12-21 ENCOUNTER — Emergency Department: Payer: Medicare Other

## 2017-12-21 ENCOUNTER — Encounter: Payer: Self-pay | Admitting: Emergency Medicine

## 2017-12-21 DIAGNOSIS — I5032 Chronic diastolic (congestive) heart failure: Secondary | ICD-10-CM | POA: Insufficient documentation

## 2017-12-21 DIAGNOSIS — E039 Hypothyroidism, unspecified: Secondary | ICD-10-CM | POA: Insufficient documentation

## 2017-12-21 DIAGNOSIS — I110 Hypertensive heart disease with heart failure: Secondary | ICD-10-CM | POA: Diagnosis not present

## 2017-12-21 DIAGNOSIS — R609 Edema, unspecified: Secondary | ICD-10-CM

## 2017-12-21 DIAGNOSIS — R600 Localized edema: Secondary | ICD-10-CM | POA: Insufficient documentation

## 2017-12-21 DIAGNOSIS — Z79899 Other long term (current) drug therapy: Secondary | ICD-10-CM | POA: Diagnosis not present

## 2017-12-21 DIAGNOSIS — Z8541 Personal history of malignant neoplasm of cervix uteri: Secondary | ICD-10-CM | POA: Diagnosis not present

## 2017-12-21 DIAGNOSIS — I509 Heart failure, unspecified: Secondary | ICD-10-CM

## 2017-12-21 DIAGNOSIS — R0600 Dyspnea, unspecified: Secondary | ICD-10-CM | POA: Diagnosis not present

## 2017-12-21 DIAGNOSIS — Z7982 Long term (current) use of aspirin: Secondary | ICD-10-CM | POA: Insufficient documentation

## 2017-12-21 DIAGNOSIS — E119 Type 2 diabetes mellitus without complications: Secondary | ICD-10-CM | POA: Insufficient documentation

## 2017-12-21 DIAGNOSIS — G8921 Chronic pain due to trauma: Secondary | ICD-10-CM | POA: Diagnosis not present

## 2017-12-21 DIAGNOSIS — F17210 Nicotine dependence, cigarettes, uncomplicated: Secondary | ICD-10-CM | POA: Insufficient documentation

## 2017-12-21 DIAGNOSIS — R0602 Shortness of breath: Secondary | ICD-10-CM | POA: Diagnosis present

## 2017-12-21 LAB — URINALYSIS, COMPLETE (UACMP) WITH MICROSCOPIC
Bilirubin Urine: NEGATIVE
Glucose, UA: NEGATIVE mg/dL
Hgb urine dipstick: NEGATIVE
KETONES UR: NEGATIVE mg/dL
LEUKOCYTES UA: NEGATIVE
Nitrite: NEGATIVE
PH: 7 (ref 5.0–8.0)
PROTEIN: NEGATIVE mg/dL
Specific Gravity, Urine: 1.011 (ref 1.005–1.030)

## 2017-12-21 LAB — CBC WITH DIFFERENTIAL/PLATELET
ABS IMMATURE GRANULOCYTES: 0.22 10*3/uL — AB (ref 0.00–0.07)
BASOS ABS: 0 10*3/uL (ref 0.0–0.1)
Basophils Relative: 0 %
Eosinophils Absolute: 0.2 10*3/uL (ref 0.0–0.5)
Eosinophils Relative: 2 %
HEMATOCRIT: 37 % (ref 36.0–46.0)
HEMOGLOBIN: 11.4 g/dL — AB (ref 12.0–15.0)
IMMATURE GRANULOCYTES: 2 %
LYMPHS ABS: 2.8 10*3/uL (ref 0.7–4.0)
LYMPHS PCT: 27 %
MCH: 29.5 pg (ref 26.0–34.0)
MCHC: 30.8 g/dL (ref 30.0–36.0)
MCV: 95.9 fL (ref 80.0–100.0)
Monocytes Absolute: 1 10*3/uL (ref 0.1–1.0)
Monocytes Relative: 9 %
NEUTROS ABS: 6.2 10*3/uL (ref 1.7–7.7)
NRBC: 0 % (ref 0.0–0.2)
Neutrophils Relative %: 60 %
Platelets: 282 10*3/uL (ref 150–400)
RBC: 3.86 MIL/uL — AB (ref 3.87–5.11)
RDW: 14.6 % (ref 11.5–15.5)
WBC: 10.4 10*3/uL (ref 4.0–10.5)

## 2017-12-21 LAB — COMPREHENSIVE METABOLIC PANEL
ALBUMIN: 3.1 g/dL — AB (ref 3.5–5.0)
ALK PHOS: 51 U/L (ref 38–126)
ALT: 12 U/L (ref 0–44)
ANION GAP: 7 (ref 5–15)
AST: 15 U/L (ref 15–41)
BILIRUBIN TOTAL: 0.3 mg/dL (ref 0.3–1.2)
BUN: 15 mg/dL (ref 8–23)
CO2: 29 mmol/L (ref 22–32)
Calcium: 8.8 mg/dL — ABNORMAL LOW (ref 8.9–10.3)
Chloride: 106 mmol/L (ref 98–111)
Creatinine, Ser: 0.66 mg/dL (ref 0.44–1.00)
GFR calc Af Amer: >60 mL/min (ref >60)
GFR calc non Af Amer: >60 mL/min (ref >60)
GLUCOSE: 113 mg/dL — AB (ref 70–99)
POTASSIUM: 4.2 mmol/L (ref 3.5–5.1)
SODIUM: 142 mmol/L (ref 135–145)
Total Protein: 5.7 g/dL — ABNORMAL LOW (ref 6.5–8.1)

## 2017-12-21 LAB — BRAIN NATRIURETIC PEPTIDE: B Natriuretic Peptide: 88 pg/mL (ref 0.0–100.0)

## 2017-12-21 LAB — TROPONIN I: Troponin I: <0.03 ng/mL (ref <0.03)

## 2017-12-21 MED ORDER — FUROSEMIDE 10 MG/ML IJ SOLN
40.0000 mg | Freq: Once | INTRAMUSCULAR | Status: AC
  Administered 2017-12-21: 40 mg via INTRAVENOUS
  Filled 2017-12-21: qty 4

## 2017-12-21 NOTE — ED Provider Notes (Signed)
-----------------------------------------   10:23 AM on 12/21/2017 -----------------------------------------  Patient care assumed from Dr. Owens Shark.  Patient's repeat troponin is negative.  I discussed this with the patient.  She continues to appear well in the emergency department with reassuring vitals, lab work has been overall reassuring.  I believe the patient is safe for discharge home at this time with PCP follow-up.   Harvest Dark, MD 12/21/17 1024

## 2017-12-21 NOTE — ED Notes (Signed)
Unable to obtain troponin after 2 attempts - lab called to come and collect sample

## 2017-12-21 NOTE — ED Triage Notes (Signed)
Pt arrived via EMS from an assisted living facility (pt recently moved there and does not know the name) with c/o shortness of breath for about 1 week; feels winded when speaking; pt with edema to lower extremities for several weeks; pt says she saw her doctor recently but they were not concerned about the swelling; pt says she is unsure what medication she is currently taken as someone at the facility gives her the medication; decreased urine output, "very concentrated"

## 2017-12-21 NOTE — ED Notes (Signed)
Patient updated regarding wait.

## 2017-12-21 NOTE — ED Notes (Signed)
Patient to waiting room via wheelchair by EMS.  Per EMS patient complains of difficulty breathing with pitting edema in lower extremities for approximately 6 weeks.  States vital signs are WNL.

## 2017-12-21 NOTE — ED Notes (Signed)
Lab came and collected blood

## 2017-12-21 NOTE — ED Provider Notes (Signed)
ED ECG REPORT I, Darel Hong, the attending physician, personally viewed and interpreted this ECG.  Date: 12/21/2017 EKG Time: 0019 Rate: 83 Rhythm: normal sinus rhythm QRS Axis: Leftward axis Intervals: normal ST/T Wave abnormalities: normal Narrative Interpretation: no evidence of acute ischemia.  When compared to previous EKG from November 11, 2017 the patient has no R wave progression concerning for an ischemic event in the meantime.    Darel Hong, MD 12/21/17 [phone]
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
## 2017-12-24 DIAGNOSIS — Z79899 Other long term (current) drug therapy: Secondary | ICD-10-CM | POA: Diagnosis not present

## 2017-12-28 ENCOUNTER — Telehealth: Payer: Self-pay | Admitting: Family

## 2017-12-28 ENCOUNTER — Ambulatory Visit: Payer: Self-pay | Admitting: Family

## 2017-12-28 NOTE — Telephone Encounter (Signed)
Patient did not show for her [facility] appointment on 12/28/17. Will attempt to reschedule.

## 2018-01-06 ENCOUNTER — Other Ambulatory Visit: Payer: Self-pay

## 2018-01-06 NOTE — Patient Outreach (Signed)
[place] [facility]) Care Management  01/06/2018  [name] 03-14-1950 [id number]   Medication Adherence call to Mrs. Kelsey Hanna patient did not answer and voice mail is full patient is due on Atorvastatin 40 mg under [place].   [place] Management Direct Dial [phone]  Fax [phone] [email or web address]

## 2018-01-27 DIAGNOSIS — I509 Heart failure, unspecified: Secondary | ICD-10-CM | POA: Diagnosis not present

## 2018-01-27 DIAGNOSIS — E039 Hypothyroidism, unspecified: Secondary | ICD-10-CM | POA: Diagnosis not present

## 2018-01-27 DIAGNOSIS — G8921 Chronic pain due to trauma: Secondary | ICD-10-CM | POA: Diagnosis not present

## 2018-01-27 DIAGNOSIS — R400 Somnolence: Secondary | ICD-10-CM | POA: Diagnosis not present

## 2018-01-27 DIAGNOSIS — R609 Edema, unspecified: Secondary | ICD-10-CM | POA: Diagnosis not present

## 2018-04-07 DIAGNOSIS — M1A9XX0 Chronic gout, unspecified, without tophus (tophi): Secondary | ICD-10-CM | POA: Diagnosis not present

## 2018-04-07 DIAGNOSIS — E7220 Disorder of urea cycle metabolism, unspecified: Secondary | ICD-10-CM | POA: Diagnosis not present

## 2018-04-08 DIAGNOSIS — D6489 Other specified anemias: Secondary | ICD-10-CM | POA: Diagnosis not present

## 2018-04-08 DIAGNOSIS — G894 Chronic pain syndrome: Secondary | ICD-10-CM | POA: Diagnosis not present

## 2018-04-10 DIAGNOSIS — E7220 Disorder of urea cycle metabolism, unspecified: Secondary | ICD-10-CM | POA: Diagnosis not present

## 2018-04-10 DIAGNOSIS — M1A9XX0 Chronic gout, unspecified, without tophus (tophi): Secondary | ICD-10-CM | POA: Diagnosis not present

## 2018-04-15 DIAGNOSIS — Z79899 Other long term (current) drug therapy: Secondary | ICD-10-CM | POA: Diagnosis not present

## 2018-04-21 DIAGNOSIS — R278 Other lack of coordination: Secondary | ICD-10-CM | POA: Diagnosis not present

## 2018-04-21 DIAGNOSIS — R2681 Unsteadiness on feet: Secondary | ICD-10-CM | POA: Diagnosis not present

## 2018-04-21 DIAGNOSIS — M6281 Muscle weakness (generalized): Secondary | ICD-10-CM | POA: Diagnosis not present

## 2018-04-27 DIAGNOSIS — M6281 Muscle weakness (generalized): Secondary | ICD-10-CM | POA: Diagnosis not present

## 2018-04-27 DIAGNOSIS — R278 Other lack of coordination: Secondary | ICD-10-CM | POA: Diagnosis not present

## 2018-04-27 DIAGNOSIS — R2681 Unsteadiness on feet: Secondary | ICD-10-CM | POA: Diagnosis not present

## 2018-04-28 DIAGNOSIS — R278 Other lack of coordination: Secondary | ICD-10-CM | POA: Diagnosis not present

## 2018-04-28 DIAGNOSIS — R2681 Unsteadiness on feet: Secondary | ICD-10-CM | POA: Diagnosis not present

## 2018-04-28 DIAGNOSIS — M6281 Muscle weakness (generalized): Secondary | ICD-10-CM | POA: Diagnosis not present

## 2018-04-29 ENCOUNTER — Other Ambulatory Visit: Payer: Self-pay

## 2018-04-29 DIAGNOSIS — G894 Chronic pain syndrome: Secondary | ICD-10-CM | POA: Diagnosis not present

## 2018-04-29 DIAGNOSIS — R29898 Other symptoms and signs involving the musculoskeletal system: Secondary | ICD-10-CM | POA: Diagnosis not present

## 2018-04-29 DIAGNOSIS — I10 Essential (primary) hypertension: Secondary | ICD-10-CM | POA: Diagnosis not present

## 2018-04-29 NOTE — Patient Outreach (Signed)
[facility]) Care Management  04/29/2018  Kelsey Hanna 1950/12/16 [id number]   Medication Adherence call to Kelsey Hanna patient did not want to speak ,patient hung up the phone Kelsey Hanna is due on Atorvastatin 40 mg under [place].   [place] Management Direct Dial [phone]  Fax [phone] [email or web address]

## 2018-05-03 DIAGNOSIS — R2681 Unsteadiness on feet: Secondary | ICD-10-CM | POA: Diagnosis not present

## 2018-05-03 DIAGNOSIS — Z79899 Other long term (current) drug therapy: Secondary | ICD-10-CM | POA: Diagnosis not present

## 2018-05-03 DIAGNOSIS — M6281 Muscle weakness (generalized): Secondary | ICD-10-CM | POA: Diagnosis not present

## 2018-05-03 DIAGNOSIS — R278 Other lack of coordination: Secondary | ICD-10-CM | POA: Diagnosis not present

## 2018-05-04 DIAGNOSIS — R2681 Unsteadiness on feet: Secondary | ICD-10-CM | POA: Diagnosis not present

## 2018-05-04 DIAGNOSIS — M6281 Muscle weakness (generalized): Secondary | ICD-10-CM | POA: Diagnosis not present

## 2018-05-04 DIAGNOSIS — R278 Other lack of coordination: Secondary | ICD-10-CM | POA: Diagnosis not present

## 2018-05-05 DIAGNOSIS — M6281 Muscle weakness (generalized): Secondary | ICD-10-CM | POA: Diagnosis not present

## 2018-05-05 DIAGNOSIS — E039 Hypothyroidism, unspecified: Secondary | ICD-10-CM | POA: Diagnosis not present

## 2018-05-05 DIAGNOSIS — M1A40X0 Other secondary chronic gout, unspecified site, without tophus (tophi): Secondary | ICD-10-CM | POA: Diagnosis not present

## 2018-05-05 DIAGNOSIS — R278 Other lack of coordination: Secondary | ICD-10-CM | POA: Diagnosis not present

## 2018-05-05 DIAGNOSIS — R609 Edema, unspecified: Secondary | ICD-10-CM | POA: Diagnosis not present

## 2018-05-05 DIAGNOSIS — E7220 Disorder of urea cycle metabolism, unspecified: Secondary | ICD-10-CM | POA: Diagnosis not present

## 2018-05-05 DIAGNOSIS — R2681 Unsteadiness on feet: Secondary | ICD-10-CM | POA: Diagnosis not present

## 2018-05-07 DIAGNOSIS — R2681 Unsteadiness on feet: Secondary | ICD-10-CM | POA: Diagnosis not present

## 2018-05-07 DIAGNOSIS — M6281 Muscle weakness (generalized): Secondary | ICD-10-CM | POA: Diagnosis not present

## 2018-05-07 DIAGNOSIS — R278 Other lack of coordination: Secondary | ICD-10-CM | POA: Diagnosis not present

## 2018-05-11 DIAGNOSIS — R278 Other lack of coordination: Secondary | ICD-10-CM | POA: Diagnosis not present

## 2018-05-11 DIAGNOSIS — R2681 Unsteadiness on feet: Secondary | ICD-10-CM | POA: Diagnosis not present

## 2018-05-11 DIAGNOSIS — M6281 Muscle weakness (generalized): Secondary | ICD-10-CM | POA: Diagnosis not present

## 2018-05-12 DIAGNOSIS — R278 Other lack of coordination: Secondary | ICD-10-CM | POA: Diagnosis not present

## 2018-05-12 DIAGNOSIS — M6281 Muscle weakness (generalized): Secondary | ICD-10-CM | POA: Diagnosis not present

## 2018-05-12 DIAGNOSIS — R2681 Unsteadiness on feet: Secondary | ICD-10-CM | POA: Diagnosis not present

## 2018-05-14 DIAGNOSIS — M6281 Muscle weakness (generalized): Secondary | ICD-10-CM | POA: Diagnosis not present

## 2018-05-14 DIAGNOSIS — R278 Other lack of coordination: Secondary | ICD-10-CM | POA: Diagnosis not present

## 2018-05-14 DIAGNOSIS — R2681 Unsteadiness on feet: Secondary | ICD-10-CM | POA: Diagnosis not present

## 2018-05-17 DIAGNOSIS — R609 Edema, unspecified: Secondary | ICD-10-CM | POA: Diagnosis not present

## 2018-05-17 DIAGNOSIS — R278 Other lack of coordination: Secondary | ICD-10-CM | POA: Diagnosis not present

## 2018-05-17 DIAGNOSIS — R2681 Unsteadiness on feet: Secondary | ICD-10-CM | POA: Diagnosis not present

## 2018-05-17 DIAGNOSIS — M6281 Muscle weakness (generalized): Secondary | ICD-10-CM | POA: Diagnosis not present

## 2018-05-17 DIAGNOSIS — E782 Mixed hyperlipidemia: Secondary | ICD-10-CM | POA: Diagnosis not present

## 2018-05-18 DIAGNOSIS — M6281 Muscle weakness (generalized): Secondary | ICD-10-CM | POA: Diagnosis not present

## 2018-05-18 DIAGNOSIS — R278 Other lack of coordination: Secondary | ICD-10-CM | POA: Diagnosis not present

## 2018-05-18 DIAGNOSIS — R2681 Unsteadiness on feet: Secondary | ICD-10-CM | POA: Diagnosis not present

## 2018-05-19 DIAGNOSIS — R609 Edema, unspecified: Secondary | ICD-10-CM | POA: Diagnosis not present

## 2018-05-19 DIAGNOSIS — R278 Other lack of coordination: Secondary | ICD-10-CM | POA: Diagnosis not present

## 2018-05-19 DIAGNOSIS — E039 Hypothyroidism, unspecified: Secondary | ICD-10-CM | POA: Diagnosis not present

## 2018-05-19 DIAGNOSIS — E782 Mixed hyperlipidemia: Secondary | ICD-10-CM | POA: Diagnosis not present

## 2018-05-19 DIAGNOSIS — M6281 Muscle weakness (generalized): Secondary | ICD-10-CM | POA: Diagnosis not present

## 2018-05-20 DIAGNOSIS — N19 Unspecified kidney failure: Secondary | ICD-10-CM | POA: Diagnosis not present

## 2018-05-20 DIAGNOSIS — E039 Hypothyroidism, unspecified: Secondary | ICD-10-CM | POA: Diagnosis not present

## 2018-05-20 DIAGNOSIS — E7220 Disorder of urea cycle metabolism, unspecified: Secondary | ICD-10-CM | POA: Diagnosis not present

## 2018-05-21 DIAGNOSIS — R278 Other lack of coordination: Secondary | ICD-10-CM | POA: Diagnosis not present

## 2018-05-21 DIAGNOSIS — M6281 Muscle weakness (generalized): Secondary | ICD-10-CM | POA: Diagnosis not present

## 2018-05-25 ENCOUNTER — Other Ambulatory Visit: Payer: Self-pay

## 2018-05-25 DIAGNOSIS — R278 Other lack of coordination: Secondary | ICD-10-CM | POA: Diagnosis not present

## 2018-05-25 DIAGNOSIS — M6281 Muscle weakness (generalized): Secondary | ICD-10-CM | POA: Diagnosis not present

## 2018-05-25 NOTE — Patient Outreach (Signed)
[facility]) Care Management  05/25/2018  Kelsey Hanna 01/26/1951 [id number]   Medication Adherence call to Mrs. Kelsey Hanna patients telephone number belongs to someone else patient is due on Atorvastatin 40 mg under [place].   [place] Management Direct Dial [phone]  Fax [phone] [email or web address]

## 2018-05-26 DIAGNOSIS — M6281 Muscle weakness (generalized): Secondary | ICD-10-CM | POA: Diagnosis not present

## 2018-05-26 DIAGNOSIS — E039 Hypothyroidism, unspecified: Secondary | ICD-10-CM | POA: Diagnosis not present

## 2018-05-26 DIAGNOSIS — I10 Essential (primary) hypertension: Secondary | ICD-10-CM | POA: Diagnosis not present

## 2018-05-26 DIAGNOSIS — R609 Edema, unspecified: Secondary | ICD-10-CM | POA: Diagnosis not present

## 2018-05-26 DIAGNOSIS — R278 Other lack of coordination: Secondary | ICD-10-CM | POA: Diagnosis not present

## 2018-05-26 DIAGNOSIS — F5101 Primary insomnia: Secondary | ICD-10-CM | POA: Diagnosis not present

## 2018-05-27 DIAGNOSIS — M6281 Muscle weakness (generalized): Secondary | ICD-10-CM | POA: Diagnosis not present

## 2018-05-27 DIAGNOSIS — R278 Other lack of coordination: Secondary | ICD-10-CM | POA: Diagnosis not present

## 2018-05-28 DIAGNOSIS — R278 Other lack of coordination: Secondary | ICD-10-CM | POA: Diagnosis not present

## 2018-05-28 DIAGNOSIS — M6281 Muscle weakness (generalized): Secondary | ICD-10-CM | POA: Diagnosis not present

## 2018-05-31 DIAGNOSIS — R278 Other lack of coordination: Secondary | ICD-10-CM | POA: Diagnosis not present

## 2018-05-31 DIAGNOSIS — M6281 Muscle weakness (generalized): Secondary | ICD-10-CM | POA: Diagnosis not present

## 2018-06-02 DIAGNOSIS — R278 Other lack of coordination: Secondary | ICD-10-CM | POA: Diagnosis not present

## 2018-06-02 DIAGNOSIS — M6281 Muscle weakness (generalized): Secondary | ICD-10-CM | POA: Diagnosis not present

## 2018-06-03 DIAGNOSIS — G894 Chronic pain syndrome: Secondary | ICD-10-CM | POA: Diagnosis not present

## 2018-06-03 DIAGNOSIS — I10 Essential (primary) hypertension: Secondary | ICD-10-CM | POA: Diagnosis not present

## 2018-06-04 DIAGNOSIS — R278 Other lack of coordination: Secondary | ICD-10-CM | POA: Diagnosis not present

## 2018-06-04 DIAGNOSIS — M6281 Muscle weakness (generalized): Secondary | ICD-10-CM | POA: Diagnosis not present

## 2018-06-05 DIAGNOSIS — R278 Other lack of coordination: Secondary | ICD-10-CM | POA: Diagnosis not present

## 2018-06-05 DIAGNOSIS — M6281 Muscle weakness (generalized): Secondary | ICD-10-CM | POA: Diagnosis not present

## 2018-06-07 DIAGNOSIS — M6281 Muscle weakness (generalized): Secondary | ICD-10-CM | POA: Diagnosis not present

## 2018-06-07 DIAGNOSIS — R278 Other lack of coordination: Secondary | ICD-10-CM | POA: Diagnosis not present

## 2018-06-08 DIAGNOSIS — E039 Hypothyroidism, unspecified: Secondary | ICD-10-CM | POA: Diagnosis not present

## 2018-06-08 DIAGNOSIS — I10 Essential (primary) hypertension: Secondary | ICD-10-CM | POA: Diagnosis not present

## 2018-06-08 DIAGNOSIS — F5101 Primary insomnia: Secondary | ICD-10-CM | POA: Diagnosis not present

## 2018-06-08 DIAGNOSIS — R609 Edema, unspecified: Secondary | ICD-10-CM | POA: Diagnosis not present

## 2018-06-09 DIAGNOSIS — R278 Other lack of coordination: Secondary | ICD-10-CM | POA: Diagnosis not present

## 2018-06-09 DIAGNOSIS — M6281 Muscle weakness (generalized): Secondary | ICD-10-CM | POA: Diagnosis not present

## 2018-06-11 DIAGNOSIS — R278 Other lack of coordination: Secondary | ICD-10-CM | POA: Diagnosis not present

## 2018-06-11 DIAGNOSIS — M6281 Muscle weakness (generalized): Secondary | ICD-10-CM | POA: Diagnosis not present

## 2018-06-14 DIAGNOSIS — R278 Other lack of coordination: Secondary | ICD-10-CM | POA: Diagnosis not present

## 2018-06-14 DIAGNOSIS — M6281 Muscle weakness (generalized): Secondary | ICD-10-CM | POA: Diagnosis not present

## 2018-06-28 DIAGNOSIS — M6281 Muscle weakness (generalized): Secondary | ICD-10-CM | POA: Diagnosis not present

## 2018-06-28 DIAGNOSIS — R2681 Unsteadiness on feet: Secondary | ICD-10-CM | POA: Diagnosis not present

## 2018-06-28 DIAGNOSIS — R278 Other lack of coordination: Secondary | ICD-10-CM | POA: Diagnosis not present

## 2018-06-30 DIAGNOSIS — R2681 Unsteadiness on feet: Secondary | ICD-10-CM | POA: Diagnosis not present

## 2018-06-30 DIAGNOSIS — M6281 Muscle weakness (generalized): Secondary | ICD-10-CM | POA: Diagnosis not present

## 2018-06-30 DIAGNOSIS — R278 Other lack of coordination: Secondary | ICD-10-CM | POA: Diagnosis not present

## 2018-07-02 DIAGNOSIS — M6281 Muscle weakness (generalized): Secondary | ICD-10-CM | POA: Diagnosis not present

## 2018-07-02 DIAGNOSIS — R278 Other lack of coordination: Secondary | ICD-10-CM | POA: Diagnosis not present

## 2018-07-02 DIAGNOSIS — R2681 Unsteadiness on feet: Secondary | ICD-10-CM | POA: Diagnosis not present

## 2018-07-05 DIAGNOSIS — R2681 Unsteadiness on feet: Secondary | ICD-10-CM | POA: Diagnosis not present

## 2018-07-05 DIAGNOSIS — G894 Chronic pain syndrome: Secondary | ICD-10-CM | POA: Diagnosis not present

## 2018-07-05 DIAGNOSIS — R278 Other lack of coordination: Secondary | ICD-10-CM | POA: Diagnosis not present

## 2018-07-05 DIAGNOSIS — I10 Essential (primary) hypertension: Secondary | ICD-10-CM | POA: Diagnosis not present

## 2018-07-05 DIAGNOSIS — M6281 Muscle weakness (generalized): Secondary | ICD-10-CM | POA: Diagnosis not present

## 2018-07-07 DIAGNOSIS — M6281 Muscle weakness (generalized): Secondary | ICD-10-CM | POA: Diagnosis not present

## 2018-07-07 DIAGNOSIS — R2681 Unsteadiness on feet: Secondary | ICD-10-CM | POA: Diagnosis not present

## 2018-07-07 DIAGNOSIS — R278 Other lack of coordination: Secondary | ICD-10-CM | POA: Diagnosis not present

## 2018-07-09 DIAGNOSIS — R278 Other lack of coordination: Secondary | ICD-10-CM | POA: Diagnosis not present

## 2018-07-09 DIAGNOSIS — M6281 Muscle weakness (generalized): Secondary | ICD-10-CM | POA: Diagnosis not present

## 2018-07-09 DIAGNOSIS — R2681 Unsteadiness on feet: Secondary | ICD-10-CM | POA: Diagnosis not present

## 2018-07-12 DIAGNOSIS — M6281 Muscle weakness (generalized): Secondary | ICD-10-CM | POA: Diagnosis not present

## 2018-07-12 DIAGNOSIS — R278 Other lack of coordination: Secondary | ICD-10-CM | POA: Diagnosis not present

## 2018-07-12 DIAGNOSIS — R2681 Unsteadiness on feet: Secondary | ICD-10-CM | POA: Diagnosis not present

## 2018-07-14 DIAGNOSIS — R278 Other lack of coordination: Secondary | ICD-10-CM | POA: Diagnosis not present

## 2018-07-14 DIAGNOSIS — R2681 Unsteadiness on feet: Secondary | ICD-10-CM | POA: Diagnosis not present

## 2018-07-14 DIAGNOSIS — M6281 Muscle weakness (generalized): Secondary | ICD-10-CM | POA: Diagnosis not present

## 2018-07-16 DIAGNOSIS — R278 Other lack of coordination: Secondary | ICD-10-CM | POA: Diagnosis not present

## 2018-07-16 DIAGNOSIS — M6281 Muscle weakness (generalized): Secondary | ICD-10-CM | POA: Diagnosis not present

## 2018-07-16 DIAGNOSIS — R2681 Unsteadiness on feet: Secondary | ICD-10-CM | POA: Diagnosis not present

## 2018-07-19 DIAGNOSIS — R278 Other lack of coordination: Secondary | ICD-10-CM | POA: Diagnosis not present

## 2018-07-19 DIAGNOSIS — R2681 Unsteadiness on feet: Secondary | ICD-10-CM | POA: Diagnosis not present

## 2018-07-19 DIAGNOSIS — M6281 Muscle weakness (generalized): Secondary | ICD-10-CM | POA: Diagnosis not present

## 2018-07-21 DIAGNOSIS — M6281 Muscle weakness (generalized): Secondary | ICD-10-CM | POA: Diagnosis not present

## 2018-07-21 DIAGNOSIS — R278 Other lack of coordination: Secondary | ICD-10-CM | POA: Diagnosis not present

## 2018-07-21 DIAGNOSIS — R2681 Unsteadiness on feet: Secondary | ICD-10-CM | POA: Diagnosis not present

## 2018-07-23 DIAGNOSIS — R2681 Unsteadiness on feet: Secondary | ICD-10-CM | POA: Diagnosis not present

## 2018-07-23 DIAGNOSIS — R278 Other lack of coordination: Secondary | ICD-10-CM | POA: Diagnosis not present

## 2018-07-23 DIAGNOSIS — M6281 Muscle weakness (generalized): Secondary | ICD-10-CM | POA: Diagnosis not present

## 2018-07-26 DIAGNOSIS — R2681 Unsteadiness on feet: Secondary | ICD-10-CM | POA: Diagnosis not present

## 2018-07-26 DIAGNOSIS — M6281 Muscle weakness (generalized): Secondary | ICD-10-CM | POA: Diagnosis not present

## 2018-07-26 DIAGNOSIS — R278 Other lack of coordination: Secondary | ICD-10-CM | POA: Diagnosis not present

## 2018-07-28 ENCOUNTER — Other Ambulatory Visit: Payer: Self-pay | Admitting: *Deleted

## 2018-07-28 DIAGNOSIS — R278 Other lack of coordination: Secondary | ICD-10-CM | POA: Diagnosis not present

## 2018-07-28 DIAGNOSIS — M6281 Muscle weakness (generalized): Secondary | ICD-10-CM | POA: Diagnosis not present

## 2018-07-28 DIAGNOSIS — I10 Essential (primary) hypertension: Secondary | ICD-10-CM | POA: Diagnosis not present

## 2018-07-28 DIAGNOSIS — R2681 Unsteadiness on feet: Secondary | ICD-10-CM | POA: Diagnosis not present

## 2018-07-28 DIAGNOSIS — G894 Chronic pain syndrome: Secondary | ICD-10-CM | POA: Diagnosis not present

## 2018-07-28 NOTE — Patient Outreach (Signed)
[facility]) Care Management  07/28/2018  Kelsey Hanna 1950-10-03 [id number]   Telephone screening call   Referral received 07/27/18 Referral source : [facility] Medicare, high claimant list    Outreach call to patient , person answering phone immediately hung up when asked to speak with patient .    Plan Will verify contact number and plan outreach in the next 4 business days.    Joylene Draft, RN, [place] Management Coordinator  [phone]- Mobile [phone]- Toll Free Main Office

## 2018-07-30 DIAGNOSIS — R278 Other lack of coordination: Secondary | ICD-10-CM | POA: Diagnosis not present

## 2018-07-30 DIAGNOSIS — M6281 Muscle weakness (generalized): Secondary | ICD-10-CM | POA: Diagnosis not present

## 2018-07-30 DIAGNOSIS — R2681 Unsteadiness on feet: Secondary | ICD-10-CM | POA: Diagnosis not present

## 2018-08-02 ENCOUNTER — Other Ambulatory Visit: Payer: Self-pay | Admitting: *Deleted

## 2018-08-02 DIAGNOSIS — R278 Other lack of coordination: Secondary | ICD-10-CM | POA: Diagnosis not present

## 2018-08-02 DIAGNOSIS — R2681 Unsteadiness on feet: Secondary | ICD-10-CM | POA: Diagnosis not present

## 2018-08-02 DIAGNOSIS — M6281 Muscle weakness (generalized): Secondary | ICD-10-CM | POA: Diagnosis not present

## 2018-08-02 NOTE — Patient Outreach (Addendum)
[place]) Care Management  08/02/2018  TALIAH PORCHE [birth year] [id number]   Referral received 07/27/18 Referral source : [facility] Medicare, high claimant list    Outreach call to patient , no answer at number in [place]. Call to PCP listed and verified contact information.  Reviewed in care everywhere after discharge from [facility] on 12/14/17, patient admitted to long term at [facility] facility. Placed call to patient daughter Adrian Prows, [place], HIPAA  Information verified.   she confirms that patient is a resident at [facility] and her care is managed there.   Plan  Will mark case as not active  : care managed by external program, [facility].    Joylene Draft, RN, [place] Management Coordinator  [phone]- Mobile [phone]- Toll Free Main Office

## 2018-08-04 DIAGNOSIS — M6281 Muscle weakness (generalized): Secondary | ICD-10-CM | POA: Diagnosis not present

## 2018-08-04 DIAGNOSIS — R2681 Unsteadiness on feet: Secondary | ICD-10-CM | POA: Diagnosis not present

## 2018-08-04 DIAGNOSIS — R278 Other lack of coordination: Secondary | ICD-10-CM | POA: Diagnosis not present

## 2018-08-06 DIAGNOSIS — M6281 Muscle weakness (generalized): Secondary | ICD-10-CM | POA: Diagnosis not present

## 2018-08-06 DIAGNOSIS — R2681 Unsteadiness on feet: Secondary | ICD-10-CM | POA: Diagnosis not present

## 2018-08-06 DIAGNOSIS — R278 Other lack of coordination: Secondary | ICD-10-CM | POA: Diagnosis not present

## 2018-08-09 DIAGNOSIS — R278 Other lack of coordination: Secondary | ICD-10-CM | POA: Diagnosis not present

## 2018-08-09 DIAGNOSIS — M6281 Muscle weakness (generalized): Secondary | ICD-10-CM | POA: Diagnosis not present

## 2018-08-09 DIAGNOSIS — R2681 Unsteadiness on feet: Secondary | ICD-10-CM | POA: Diagnosis not present

## 2018-08-11 DIAGNOSIS — R2681 Unsteadiness on feet: Secondary | ICD-10-CM | POA: Diagnosis not present

## 2018-08-11 DIAGNOSIS — M6281 Muscle weakness (generalized): Secondary | ICD-10-CM | POA: Diagnosis not present

## 2018-08-11 DIAGNOSIS — R278 Other lack of coordination: Secondary | ICD-10-CM | POA: Diagnosis not present

## 2018-08-13 DIAGNOSIS — M6281 Muscle weakness (generalized): Secondary | ICD-10-CM | POA: Diagnosis not present

## 2018-08-13 DIAGNOSIS — R278 Other lack of coordination: Secondary | ICD-10-CM | POA: Diagnosis not present

## 2018-08-13 DIAGNOSIS — R2681 Unsteadiness on feet: Secondary | ICD-10-CM | POA: Diagnosis not present

## 2018-08-15 DIAGNOSIS — M6281 Muscle weakness (generalized): Secondary | ICD-10-CM | POA: Diagnosis not present

## 2018-08-15 DIAGNOSIS — R2681 Unsteadiness on feet: Secondary | ICD-10-CM | POA: Diagnosis not present

## 2018-08-15 DIAGNOSIS — R278 Other lack of coordination: Secondary | ICD-10-CM | POA: Diagnosis not present

## 2018-08-17 ENCOUNTER — Other Ambulatory Visit: Payer: Self-pay | Admitting: *Deleted

## 2018-08-17 NOTE — Patient Outreach (Signed)
[facility]) Care Management  08/17/2018  LEWIS GRIVAS 24-Apr-1950 [id number]   Telephone Screen  Referral Date:  08/09/2018 Referral Source:  [facility] High Risk List Reason for Referral:  Assess Needs Insurance:  [employer]   Outreach Attempt:  Received referral from [facility] high risk patient list.  Upon chart review, patient has been outreached within this past month of June by [place] Coordinator.  Per 08/02/2018 [facility] note, patient is now resident of [facility] and daughter states her care is managed there.  Plan:  RN Health Coach will mark patient inactive with [facility] as care is being managed by external program.   Hubert Azure RN [place] [phone] Johneric [email or web address]

## 2018-09-01 DIAGNOSIS — G20 Parkinson's disease: Secondary | ICD-10-CM | POA: Diagnosis not present

## 2018-09-01 DIAGNOSIS — I10 Essential (primary) hypertension: Secondary | ICD-10-CM | POA: Diagnosis not present

## 2019-01-28 IMAGING — CT CT HEAD CODE STROKE
3 series · 14 of 47 positions shown, 16 images · non-contrast
Comparison: CTA neck 01/24/2017. Brain MRI and MRA 01/24/2017. Head
CT 01/23/2017. Sinuses

CLINICAL DATA: Code stroke. 67-year-old female with abnormal speech
and bilateral weakness. Left ICA paraophthalmic aneurysm.

EXAM:
CT HEAD WITHOUT CONTRAST
TECHNIQUE: Contiguous axial images were obtained from the base of the skull
through the vertex without intravenous contrast.

[Series 3: head 5.0 st · axial · 0.47mm/px · z∈[-137,-12]mm · 8 of 31 slices shown, 10 images]
[im 3/31  brain]
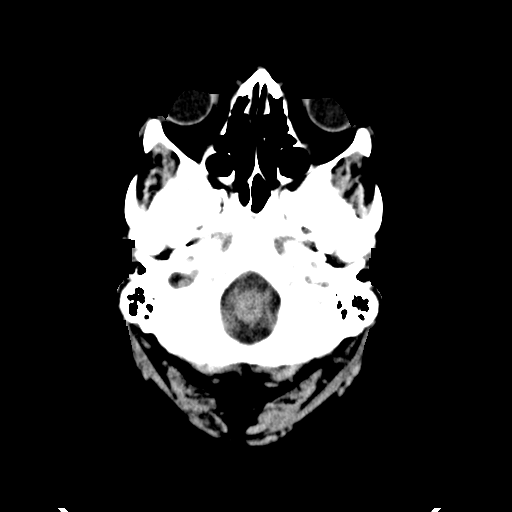
[im 3/31  bone]
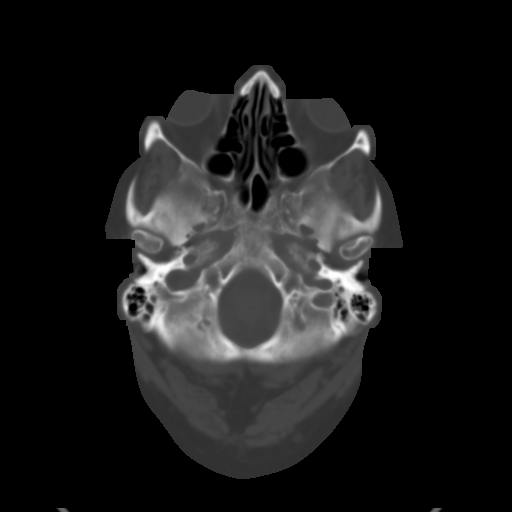
[im 7/31  brain]
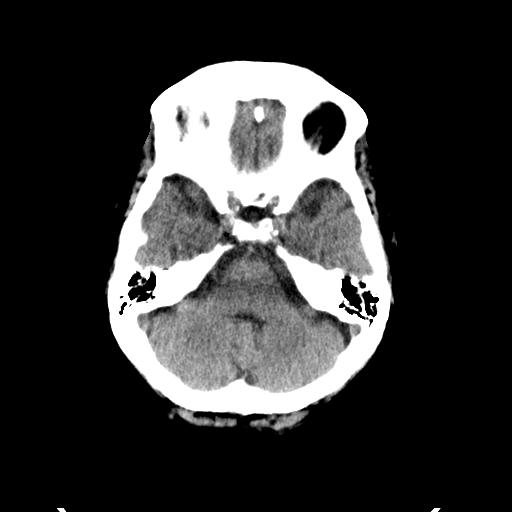
[im 10/31  brain]
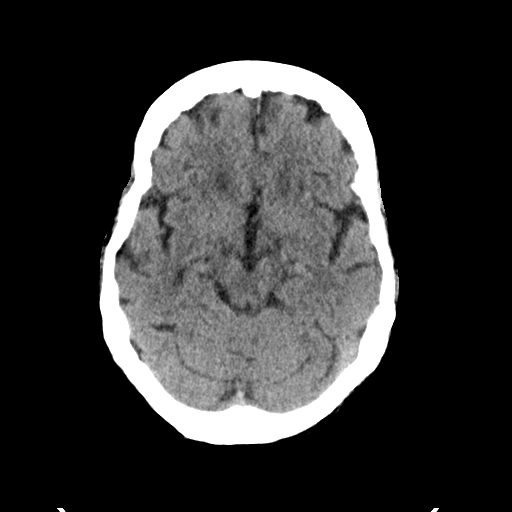
[im 14/31  brain]
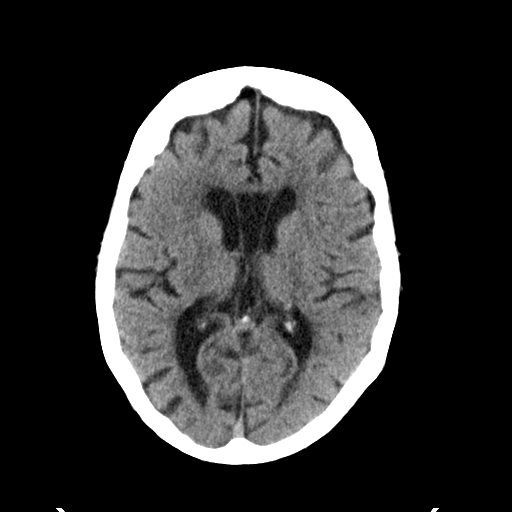
[im 17/31  brain]
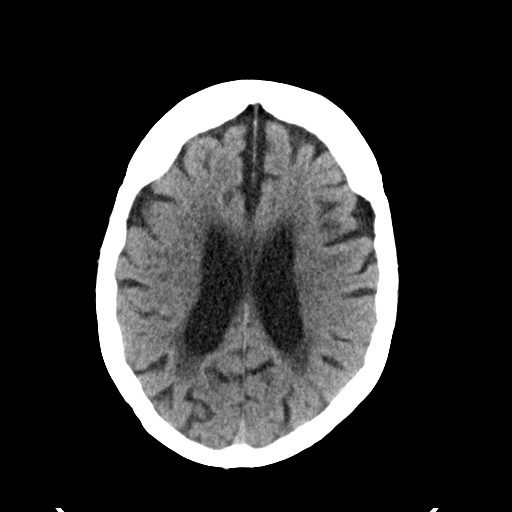
[im 17/31  bone]
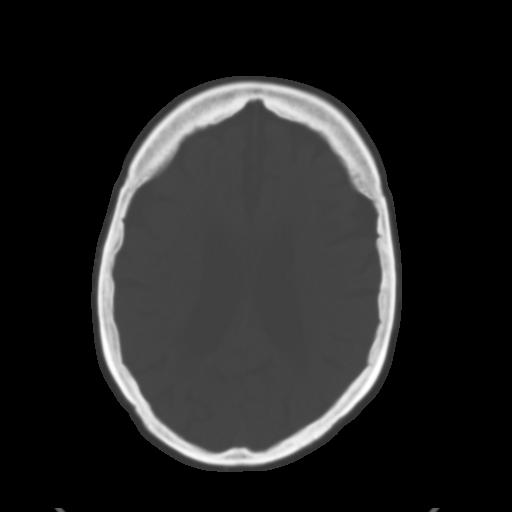
[im 21/31  brain]
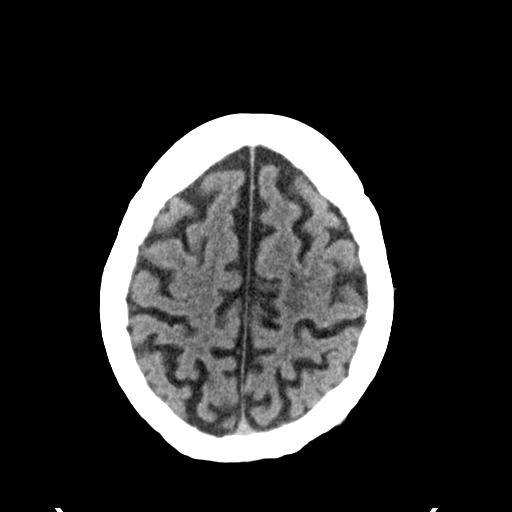
[im 24/31  brain]
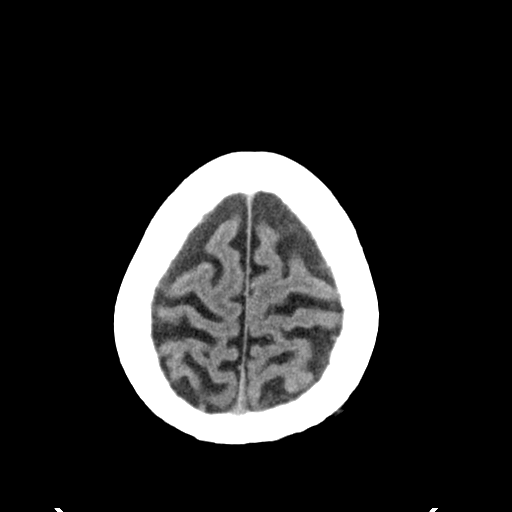
[im 28/31  brain]
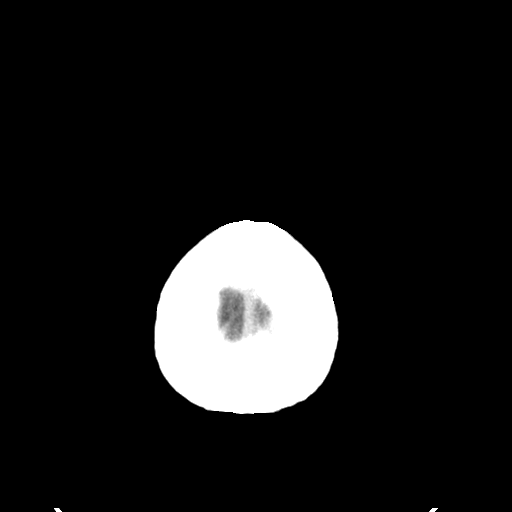

[Series 5: head 3.0 cor st · coronal · 0.29mm/px · 3 of 74 slices shown]
[im 25/74  brain]
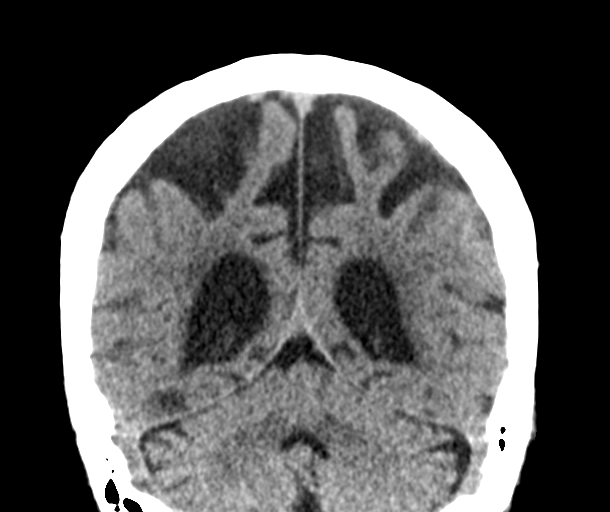
[im 33/74  brain]
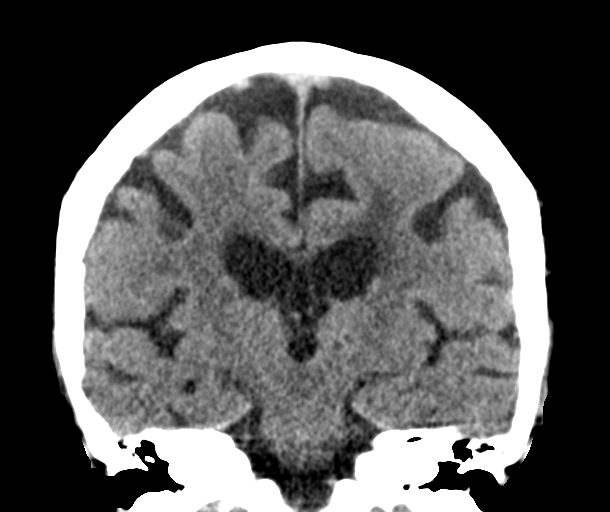
[im 41/74  brain]
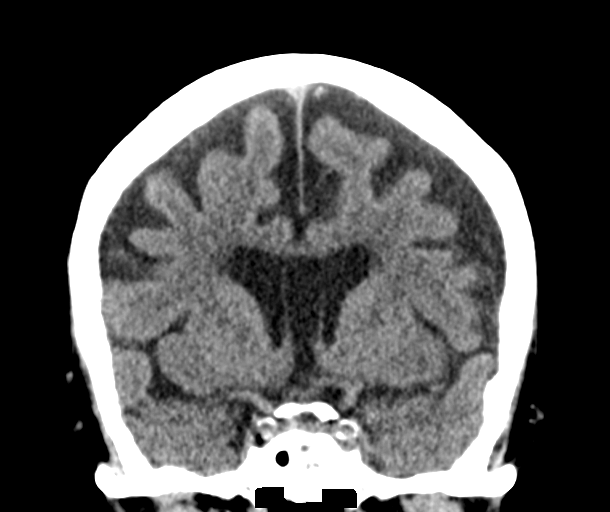

[Series 6: head 3.0 sag st · sagittal · 0.33mm/px · 3 of 58 slices shown]
[im 20/58  brain]
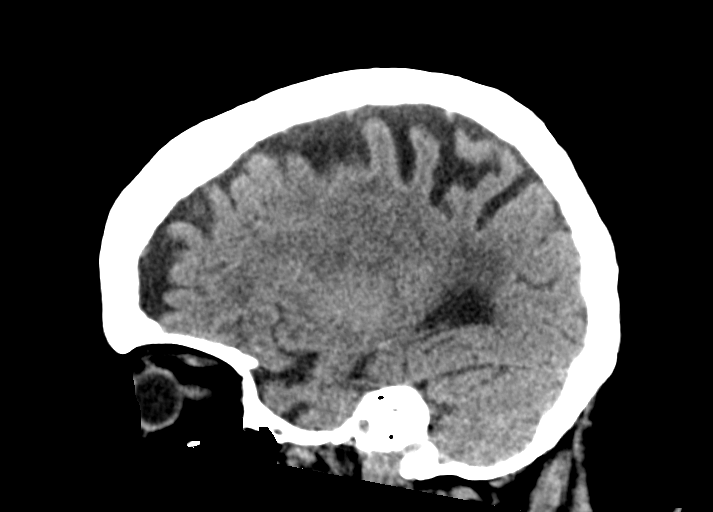
[im 29/58  brain]
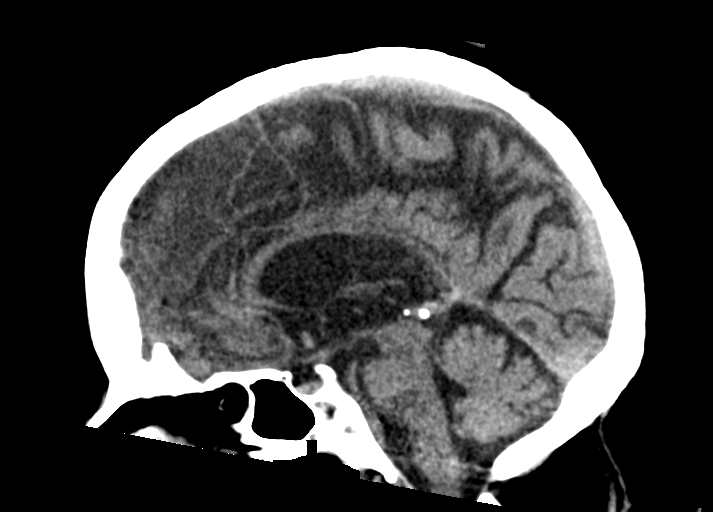
[im 39/58  brain]
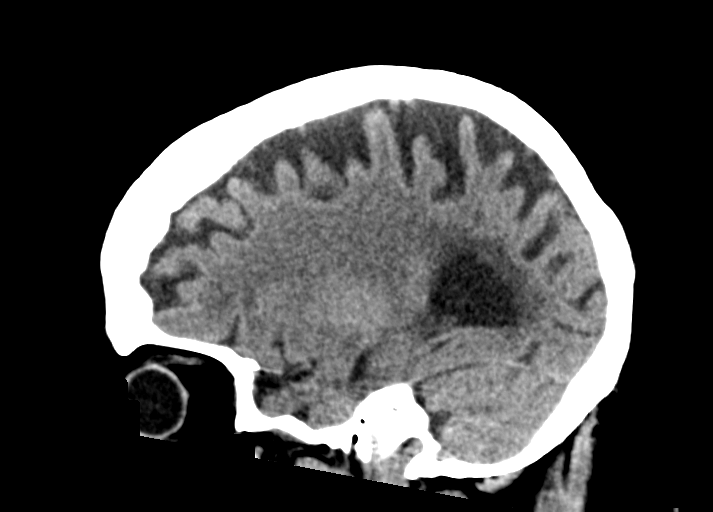

[14 of 47 positions shown; findings below may reference images not displayed]

FINDINGS: Brain: Stable cerebral volume. Cavum septum pellucidum, normal
variant. No ventriculomegaly. No midline shift, mass effect, or
evidence of intracranial mass lesion.

Patchy bilateral white matter hypodensity is stable. No acute
intracranial hemorrhage identified. No cortically based acute
infarct identified.

Vascular: Calcified atherosclerosis at the skull base. No suspicious
intracranial vascular hyperdensity.

Skull: No acute osseous abnormality identified. Hyperostosis
frontalis, normal variant.

Sinuses/Orbits: Stable and negative.

Other: Stable and negative.

ASPECTS (Alberta Stroke Program Early CT Score)

- Ganglionic level infarction (caudate, lentiform nuclei, internal
capsule, insula, M1-M3 cortex): 7

- Supraganglionic infarction (M4-M6 cortex): 3

Total score (0-10 with 10 being normal): 10
IMPRESSION: 1. Stable non contrast CT appearance of the brain since January 2017. ASPECTS is 10.
3. These results were communicated to Dr. Pozisa at [DATE] pmon
03/12/2017by text page via the AMION messaging system.

## 2019-04-05 ENCOUNTER — Other Ambulatory Visit: Payer: Self-pay

## 2019-04-05 ENCOUNTER — Emergency Department
Admission: EM | Admit: 2019-04-05 | Discharge: 2019-04-05 | Disposition: A | Discharge status: Home / Self Care | Payer: Medicare Other | Attending: Emergency Medicine | Admitting: Emergency Medicine

## 2019-04-05 ENCOUNTER — Emergency Department: Payer: Medicare Other

## 2019-04-05 ENCOUNTER — Encounter: Payer: Self-pay | Admitting: Emergency Medicine

## 2019-04-05 DIAGNOSIS — Z8673 Personal history of transient ischemic attack (TIA), and cerebral infarction without residual deficits: Secondary | ICD-10-CM | POA: Insufficient documentation

## 2019-04-05 DIAGNOSIS — E039 Hypothyroidism, unspecified: Secondary | ICD-10-CM | POA: Diagnosis not present

## 2019-04-05 DIAGNOSIS — R112 Nausea with vomiting, unspecified: Secondary | ICD-10-CM | POA: Diagnosis present

## 2019-04-05 DIAGNOSIS — E876 Hypokalemia: Secondary | ICD-10-CM | POA: Diagnosis not present

## 2019-04-05 DIAGNOSIS — Z79899 Other long term (current) drug therapy: Secondary | ICD-10-CM | POA: Insufficient documentation

## 2019-04-05 DIAGNOSIS — F17210 Nicotine dependence, cigarettes, uncomplicated: Secondary | ICD-10-CM | POA: Diagnosis not present

## 2019-04-05 DIAGNOSIS — Z7982 Long term (current) use of aspirin: Secondary | ICD-10-CM | POA: Insufficient documentation

## 2019-04-05 DIAGNOSIS — I5032 Chronic diastolic (congestive) heart failure: Secondary | ICD-10-CM | POA: Insufficient documentation

## 2019-04-05 DIAGNOSIS — Z8542 Personal history of malignant neoplasm of other parts of uterus: Secondary | ICD-10-CM | POA: Insufficient documentation

## 2019-04-05 DIAGNOSIS — R519 Headache, unspecified: Secondary | ICD-10-CM | POA: Insufficient documentation

## 2019-04-05 DIAGNOSIS — I110 Hypertensive heart disease with heart failure: Secondary | ICD-10-CM | POA: Insufficient documentation

## 2019-04-05 LAB — COMPREHENSIVE METABOLIC PANEL
ALT: 6 U/L (ref 0–44)
AST: 22 U/L (ref 15–41)
Albumin: 3.6 g/dL (ref 3.5–5.0)
Alkaline Phosphatase: 77 U/L (ref 38–126)
Anion gap: 16 — ABNORMAL HIGH (ref 5–15)
BUN: 19 mg/dL (ref 8–23)
CO2: 37 mmol/L — ABNORMAL HIGH (ref 22–32)
Calcium: 9.1 mg/dL (ref 8.9–10.3)
Chloride: 84 mmol/L — ABNORMAL LOW (ref 98–111)
Creatinine, Ser: 0.82 mg/dL (ref 0.44–1.00)
GFR calc Af Amer: >60 mL/min (ref >60)
GFR calc non Af Amer: >60 mL/min (ref >60)
Glucose, Bld: 171 mg/dL — ABNORMAL HIGH (ref 70–99)
Potassium: 2.7 mmol/L — CL (ref 3.5–5.1)
Sodium: 137 mmol/L (ref 135–145)
Total Bilirubin: 0.6 mg/dL (ref 0.3–1.2)
Total Protein: 6.9 g/dL (ref 6.5–8.1)

## 2019-04-05 LAB — CBC
HCT: 39.4 % (ref 36.0–46.0)
Hemoglobin: 13.3 g/dL (ref 12.0–15.0)
MCH: 30.6 pg (ref 26.0–34.0)
MCHC: 33.8 g/dL (ref 30.0–36.0)
MCV: 90.8 fL (ref 80.0–100.0)
Platelets: 253 10*3/uL (ref 150–400)
RBC: 4.34 MIL/uL (ref 3.87–5.11)
RDW: 12.4 % (ref 11.5–15.5)
WBC: 12.5 10*3/uL — ABNORMAL HIGH (ref 4.0–10.5)
nRBC: 0 % (ref 0.0–0.2)

## 2019-04-05 LAB — URINALYSIS, COMPLETE (UACMP) WITH MICROSCOPIC
Bilirubin Urine: NEGATIVE
Glucose, UA: NEGATIVE mg/dL
Hgb urine dipstick: NEGATIVE
Ketones, ur: NEGATIVE mg/dL
Leukocytes,Ua: NEGATIVE
Nitrite: NEGATIVE
Protein, ur: NEGATIVE mg/dL
Specific Gravity, Urine: 1.004 — ABNORMAL LOW (ref 1.005–1.030)
pH: 7 (ref 5.0–8.0)

## 2019-04-05 LAB — LIPASE, BLOOD: Lipase: 34 U/L (ref 11–51)

## 2019-04-05 LAB — MAGNESIUM: Magnesium: 1.3 mg/dL — ABNORMAL LOW (ref 1.7–2.4)

## 2019-04-05 MED ORDER — POTASSIUM CHLORIDE 10 MEQ/100ML IV SOLN
10.0000 meq | INTRAVENOUS | Status: DC
  Filled 2019-04-05 (×2): qty 100

## 2019-04-05 MED ORDER — POTASSIUM CHLORIDE CRYS ER 20 MEQ PO TBCR
40.0000 meq | EXTENDED_RELEASE_TABLET | Freq: Once | ORAL | Status: AC
  Administered 2019-04-05: 40 meq via ORAL

## 2019-04-05 MED ORDER — POTASSIUM CHLORIDE IN NACL 20-0.9 MEQ/L-% IV SOLN
Freq: Once | INTRAVENOUS | Status: AC
  Filled 2019-04-05: qty 1000

## 2019-04-05 MED ORDER — LACTATED RINGERS IV BOLUS
1000.0000 mL | Freq: Once | INTRAVENOUS | Status: AC
  Administered 2019-04-05: 1000 mL via INTRAVENOUS

## 2019-04-05 MED ORDER — SODIUM CHLORIDE 0.9% FLUSH: 3.0000 mL | Freq: Once | INTRAVENOUS | Status: DC

## 2019-04-05 MED ORDER — POTASSIUM CHLORIDE ER 10 MEQ PO TBCR: 20.0000 meq | EXTENDED_RELEASE_TABLET | Freq: Every day | ORAL | 0 refills | Status: DC

## 2019-04-05 MED ORDER — MAGNESIUM SULFATE 2 GM/50ML IV SOLN
2.0000 g | Freq: Once | INTRAVENOUS | Status: AC
  Administered 2019-04-05: 2 g via INTRAVENOUS
  Filled 2019-04-05: qty 50

## 2019-04-05 MED ORDER — IOHEXOL 300 MG/ML  SOLN
100.0000 mL | Freq: Once | INTRAMUSCULAR | Status: AC | PRN
  Administered 2019-04-05: 20:00:00 100 mL via INTRAVENOUS

## 2019-04-05 MED ORDER — ONDANSETRON 4 MG PO TBDP
4.0000 mg | ORAL_TABLET | Freq: Once | ORAL | Status: AC
  Administered 2019-04-05: 4 mg via ORAL
  Filled 2019-04-05: qty 1

## 2019-04-05 MED ORDER — ONDANSETRON HCL 4 MG/2ML IJ SOLN
4.0000 mg | Freq: Once | INTRAMUSCULAR | Status: AC
  Administered 2019-04-05: 18:00:00 4 mg via INTRAVENOUS
  Filled 2019-04-05: qty 2

## 2019-04-05 NOTE — Discharge Instructions (Addendum)
  Your imaging was re-assuring. Your Mag and K was low. Take the potassium for 3 days.  Follow up with PCP by Friday for recheck of bmp/mag.   Return to Er for continued vomiting.    IMPRESSION:  1. No CT evidence for acute intra-abdominal or pelvic abnormality.  2. 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 Large duodenal diverticulum  4. Hepatic steatosis

## 2019-04-05 NOTE — ED Notes (Signed)
Attempted to start PIV x 3 unsuccessful.

## 2019-04-05 NOTE — ED Notes (Signed)
Per CT pt needs IV larger than 24G. Pt has been stuck x3 in triage, once by this RN, once by Amy, RN. Asked charge RN to attempt.

## 2019-04-05 NOTE — ED Notes (Signed)
Pt given meal tray, tolerated PO intake well. Daughter updated on pt condition and plan of care.

## 2019-04-05 NOTE — ED Triage Notes (Signed)
C/O vomiting x 2 days.  States able to tolerate liquids.  From [employer]  AAOx3.  Skin warm and dry. NAD
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
## 2019-10-28 ENCOUNTER — Other Ambulatory Visit: Payer: Self-pay

## 2019-10-28 ENCOUNTER — Emergency Department: Payer: Medicare Other

## 2019-10-28 ENCOUNTER — Encounter: Payer: Self-pay | Admitting: Intensive Care

## 2019-10-28 ENCOUNTER — Emergency Department
Admission: EM | Admit: 2019-10-28 | Discharge: 2019-10-29 | Disposition: A | Discharge status: Home / Self Care | Payer: Medicare Other | Attending: Emergency Medicine | Admitting: Emergency Medicine

## 2019-10-28 DIAGNOSIS — R0902 Hypoxemia: Secondary | ICD-10-CM | POA: Insufficient documentation

## 2019-10-28 DIAGNOSIS — R4182 Altered mental status, unspecified: Secondary | ICD-10-CM | POA: Insufficient documentation

## 2019-10-28 DIAGNOSIS — R0600 Dyspnea, unspecified: Secondary | ICD-10-CM | POA: Insufficient documentation

## 2019-10-28 DIAGNOSIS — Z7982 Long term (current) use of aspirin: Secondary | ICD-10-CM | POA: Insufficient documentation

## 2019-10-28 DIAGNOSIS — R0602 Shortness of breath: Secondary | ICD-10-CM | POA: Diagnosis present

## 2019-10-28 DIAGNOSIS — F17210 Nicotine dependence, cigarettes, uncomplicated: Secondary | ICD-10-CM | POA: Diagnosis not present

## 2019-10-28 DIAGNOSIS — Z20822 Contact with and (suspected) exposure to covid-19: Secondary | ICD-10-CM | POA: Insufficient documentation

## 2019-10-28 DIAGNOSIS — Z79890 Hormone replacement therapy (postmenopausal): Secondary | ICD-10-CM | POA: Insufficient documentation

## 2019-10-28 DIAGNOSIS — E035 Myxedema coma: Secondary | ICD-10-CM

## 2019-10-28 LAB — CBC WITH DIFFERENTIAL/PLATELET
Abs Immature Granulocytes: 0.03 10*3/uL (ref 0.00–0.07)
Basophils Absolute: 0 10*3/uL (ref 0.0–0.1)
Basophils Relative: 0 %
Eosinophils Absolute: 0 10*3/uL (ref 0.0–0.5)
Eosinophils Relative: 1 %
HCT: 34.6 % — ABNORMAL LOW (ref 36.0–46.0)
Hemoglobin: 11.7 g/dL — ABNORMAL LOW (ref 12.0–15.0)
Immature Granulocytes: 1 %
Lymphocytes Relative: 41 %
Lymphs Abs: 2.6 10*3/uL (ref 0.7–4.0)
MCH: 34.4 pg — ABNORMAL HIGH (ref 26.0–34.0)
MCHC: 33.8 g/dL (ref 30.0–36.0)
MCV: 101.8 fL — ABNORMAL HIGH (ref 80.0–100.0)
Monocytes Absolute: 0.6 10*3/uL (ref 0.1–1.0)
Monocytes Relative: 9 %
Neutro Abs: 3.1 10*3/uL (ref 1.7–7.7)
Neutrophils Relative %: 48 %
Platelets: 125 10*3/uL — ABNORMAL LOW (ref 150–400)
RBC: 3.4 MIL/uL — ABNORMAL LOW (ref 3.87–5.11)
RDW: 13.1 % (ref 11.5–15.5)
WBC: 6.3 10*3/uL (ref 4.0–10.5)
nRBC: 0 % (ref 0.0–0.2)

## 2019-10-28 LAB — BLOOD GAS, VENOUS
Acid-Base Excess: 19.2 mmol/L — ABNORMAL HIGH (ref 0.0–2.0)
Acid-Base Excess: 17.8 mmol/L — ABNORMAL HIGH (ref 0.0–2.0)
Bicarbonate: 47.4 mmol/L — ABNORMAL HIGH (ref 20.0–28.0)
Bicarbonate: 46.7 mmol/L — ABNORMAL HIGH (ref 20.0–28.0)
FIO2: 100
O2 Saturation: 76.1 %
O2 Saturation: 62.2 %
Patient temperature: 37
Patient temperature: 37
pCO2, Ven: 73 mmHg (ref 44.0–60.0)
pCO2, Ven: 79 mmHg (ref 44.0–60.0)
pH, Ven: 7.42 (ref 7.250–7.430)
pH, Ven: 7.38 (ref 7.250–7.430)
pO2, Ven: 40 mmHg (ref 32.0–45.0)
pO2, Ven: 33 mmHg (ref 32.0–45.0)

## 2019-10-28 LAB — LACTIC ACID, PLASMA
Lactic Acid, Venous: 1.9 mmol/L (ref 0.5–1.9)
Lactic Acid, Venous: 1.6 mmol/L (ref 0.5–1.9)

## 2019-10-28 LAB — TSH: TSH: 107.086 u[IU]/mL — ABNORMAL HIGH (ref 0.350–4.500)

## 2019-10-28 LAB — COMPREHENSIVE METABOLIC PANEL
ALT: 11 U/L (ref 0–44)
AST: 24 U/L (ref 15–41)
Albumin: 3.9 g/dL (ref 3.5–5.0)
Alkaline Phosphatase: 59 U/L (ref 38–126)
Anion gap: 15 (ref 5–15)
BUN: 24 mg/dL — ABNORMAL HIGH (ref 8–23)
CO2: 37 mmol/L — ABNORMAL HIGH (ref 22–32)
Calcium: 9.4 mg/dL (ref 8.9–10.3)
Chloride: 91 mmol/L — ABNORMAL LOW (ref 98–111)
Creatinine, Ser: 1.39 mg/dL — ABNORMAL HIGH (ref 0.44–1.00)
GFR calc Af Amer: 44 mL/min — ABNORMAL LOW (ref >60)
GFR calc non Af Amer: 38 mL/min — ABNORMAL LOW (ref >60)
Glucose, Bld: 88 mg/dL (ref 70–99)
Potassium: 3.6 mmol/L (ref 3.5–5.1)
Sodium: 143 mmol/L (ref 135–145)
Total Bilirubin: 0.8 mg/dL (ref 0.3–1.2)
Total Protein: 6.9 g/dL (ref 6.5–8.1)

## 2019-10-28 LAB — VALPROIC ACID LEVEL: Valproic Acid Lvl: 58 ug/mL (ref 50.0–100.0)

## 2019-10-28 LAB — SARS CORONAVIRUS 2 BY RT PCR (HOSPITAL ORDER, PERFORMED IN ~~LOC~~ HOSPITAL LAB): SARS Coronavirus 2: NEGATIVE

## 2019-10-28 LAB — AMMONIA: Ammonia: 28 umol/L (ref 9–35)

## 2019-10-28 LAB — FIBRIN DERIVATIVES D-DIMER (ARMC ONLY): Fibrin derivatives D-dimer (ARMC): 517.24 ng/mL (FEU) — ABNORMAL HIGH (ref 0.00–499.00)

## 2019-10-28 LAB — T4, FREE: Free T4: <0.25 ng/dL — ABNORMAL LOW (ref 0.61–1.12)

## 2019-10-28 LAB — TROPONIN I (HIGH SENSITIVITY)
Troponin I (High Sensitivity): 5 ng/L (ref <18)
Troponin I (High Sensitivity): 6 ng/L (ref <18)

## 2019-10-28 LAB — CORTISOL: Cortisol, Plasma: 11.1 ug/dL

## 2019-10-28 LAB — BRAIN NATRIURETIC PEPTIDE: B Natriuretic Peptide: 29.6 pg/mL (ref 0.0–100.0)

## 2019-10-28 MED ORDER — SODIUM CHLORIDE 0.9 % IV SOLN: Freq: Once | INTRAVENOUS | Status: AC

## 2019-10-28 MED ORDER — LIOTHYRONINE SODIUM 5 MCG PO TABS
20.0000 ug | ORAL_TABLET | Freq: Once | ORAL | Status: AC
  Administered 2019-10-28: 20 ug via ORAL
  Filled 2019-10-28: qty 4

## 2019-10-28 MED ORDER — IPRATROPIUM-ALBUTEROL 0.5-2.5 (3) MG/3ML IN SOLN
3.0000 mL | Freq: Once | RESPIRATORY_TRACT | Status: AC
  Administered 2019-10-28: 3 mL via RESPIRATORY_TRACT
  Filled 2019-10-28: qty 3

## 2019-10-28 MED ORDER — HYDROCORTISONE NA SUCCINATE PF 100 MG IJ SOLR
100.0000 mg | Freq: Once | INTRAMUSCULAR | Status: AC
  Administered 2019-10-28: 100 mg via INTRAVENOUS
  Filled 2019-10-28: qty 2

## 2019-10-28 MED ORDER — LEVOTHYROXINE SODIUM 100 MCG/5ML IV SOLN
200.0000 ug | Freq: Every day | INTRAVENOUS | Status: DC
  Administered 2019-10-28: 200 ug via INTRAVENOUS
  Filled 2019-10-28 (×2): qty 10

## 2019-10-28 MED ORDER — LEVOTHYROXINE SODIUM 100 MCG/5ML IV SOLN
100.0000 ug | Freq: Every day | INTRAVENOUS | Status: DC
  Administered 2019-10-28: 100 ug via INTRAVENOUS
  Filled 2019-10-28 (×2): qty 5

## 2019-10-28 NOTE — ED Provider Notes (Addendum)
Patient received in signout from Dr. Cinda Quest.  On my evaluation patient does awaken to voice does appear confused and slightly drowsy.  She is maintaining saturations on nasal cannula.  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  Will order IV T4 T3 as well as IV cortisol.  I will consult [facility] as we do not have endocrinology specialty here.  ----------------------------------------- 8:15 PM on 10/28/2019 -----------------------------------------  Repeat VBG does show interval worsening.  She is still arousable but is still persistently confused.  Will trial BiPAP.  Unfortunately does not have that capacity to take patient is on BiPAP.  Will see if [facility] has bed capacity.   Merlyn Lot, MD 10/28/19 2114  Patient has been accepted to [facility] by Dr. Alveria Apley, ICU attending.  Patient remains ill but hemodynamically stable.  I have ordered additional IV levothyroxine.  .Critical Care Performed by: Merlyn Lot, MD Authorized by: Merlyn Lot, MD   Critical care provider statement:    Critical care time (minutes):  45   Critical care time was exclusive of:  Separately billable procedures and treating other patients   Critical care was necessary to treat or prevent imminent or life-threatening deterioration of the following conditions:  Respiratory failure and endocrine crisis   Critical care was time spent personally by me on the following activities:  Development of treatment plan with patient or surrogate, discussions with consultants, evaluation of patient's response to treatment, examination of patient, obtaining history from patient or surrogate, ordering and performing treatments and interventions, ordering and review of laboratory studies, ordering and review of radiographic studies, pulse oximetry, re-evaluation of patient's condition and review of old charts       Merlyn Lot, MD 10/28/19 2226    Merlyn Lot, MD 10/28/19 2226

## 2019-10-28 NOTE — ED Triage Notes (Signed)
Patient arrived from [facility] for sob. Oxygen on room air noted to be 86%

## 2019-10-28 NOTE — ED Notes (Signed)
RT notified of order for Bi-Pap.
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
## 2019-10-28 NOTE — ED Notes (Addendum)
Patient arrived by EMS to room 13. This RN noticed one necklace on patient which is at bedside at this time. Patient reports she is missing another necklace. When asked to describe necklace patient is unsure. Patient has been confused intermittently upon arrival. Confirmed with CT and samantha in Radiology they also saw one necklace also. Radiology had placed necklace on bedside table and this RN put necklace in bag

## 2019-10-28 NOTE — ED Notes (Signed)
Pt back from ct

## 2019-10-29 NOTE — ED Notes (Signed)
Pt unable to sign for tranfer due to AMS.

## 2019-11-12 ENCOUNTER — Emergency Department: Payer: Medicare Other

## 2019-11-12 ENCOUNTER — Inpatient Hospital Stay
Admission: EM | Admit: 2019-11-12 | Discharge: 2019-11-16 | DRG: 643 | Disposition: A | Discharge status: Skilled Nursing Facility | Payer: Medicare Other | Attending: Hospitalist | Admitting: Hospitalist

## 2019-11-12 ENCOUNTER — Other Ambulatory Visit: Payer: Self-pay

## 2019-11-12 DIAGNOSIS — E039 Hypothyroidism, unspecified: Secondary | ICD-10-CM | POA: Diagnosis present

## 2019-11-12 DIAGNOSIS — E1142 Type 2 diabetes mellitus with diabetic polyneuropathy: Secondary | ICD-10-CM | POA: Diagnosis present

## 2019-11-12 DIAGNOSIS — J9611 Chronic respiratory failure with hypoxia: Secondary | ICD-10-CM | POA: Diagnosis present

## 2019-11-12 DIAGNOSIS — M792 Neuralgia and neuritis, unspecified: Secondary | ICD-10-CM | POA: Diagnosis present

## 2019-11-12 DIAGNOSIS — E876 Hypokalemia: Secondary | ICD-10-CM | POA: Diagnosis present

## 2019-11-12 DIAGNOSIS — I480 Paroxysmal atrial fibrillation: Secondary | ICD-10-CM | POA: Diagnosis present

## 2019-11-12 DIAGNOSIS — G20 Parkinson's disease: Secondary | ICD-10-CM | POA: Diagnosis present

## 2019-11-12 DIAGNOSIS — Z79890 Hormone replacement therapy (postmenopausal): Secondary | ICD-10-CM

## 2019-11-12 DIAGNOSIS — F419 Anxiety disorder, unspecified: Secondary | ICD-10-CM | POA: Diagnosis present

## 2019-11-12 DIAGNOSIS — Z888 Allergy status to other drugs, medicaments and biological substances status: Secondary | ICD-10-CM

## 2019-11-12 DIAGNOSIS — K219 Gastro-esophageal reflux disease without esophagitis: Secondary | ICD-10-CM | POA: Diagnosis present

## 2019-11-12 DIAGNOSIS — F0280 Dementia in other diseases classified elsewhere without behavioral disturbance: Secondary | ICD-10-CM | POA: Diagnosis present

## 2019-11-12 DIAGNOSIS — Z801 Family history of malignant neoplasm of trachea, bronchus and lung: Secondary | ICD-10-CM

## 2019-11-12 DIAGNOSIS — Z8673 Personal history of transient ischemic attack (TIA), and cerebral infarction without residual deficits: Secondary | ICD-10-CM

## 2019-11-12 DIAGNOSIS — G4733 Obstructive sleep apnea (adult) (pediatric): Secondary | ICD-10-CM | POA: Diagnosis present

## 2019-11-12 DIAGNOSIS — M549 Dorsalgia, unspecified: Secondary | ICD-10-CM | POA: Diagnosis present

## 2019-11-12 DIAGNOSIS — F17200 Nicotine dependence, unspecified, uncomplicated: Secondary | ICD-10-CM | POA: Diagnosis present

## 2019-11-12 DIAGNOSIS — Z66 Do not resuscitate: Secondary | ICD-10-CM | POA: Diagnosis present

## 2019-11-12 DIAGNOSIS — Z20822 Contact with and (suspected) exposure to covid-19: Secondary | ICD-10-CM | POA: Diagnosis present

## 2019-11-12 DIAGNOSIS — R4182 Altered mental status, unspecified: Secondary | ICD-10-CM | POA: Diagnosis present

## 2019-11-12 DIAGNOSIS — M109 Gout, unspecified: Secondary | ICD-10-CM | POA: Diagnosis present

## 2019-11-12 DIAGNOSIS — E785 Hyperlipidemia, unspecified: Secondary | ICD-10-CM | POA: Diagnosis present

## 2019-11-12 DIAGNOSIS — I110 Hypertensive heart disease with heart failure: Secondary | ICD-10-CM | POA: Diagnosis present

## 2019-11-12 DIAGNOSIS — Z9114 Patient's other noncompliance with medication regimen: Secondary | ICD-10-CM

## 2019-11-12 DIAGNOSIS — E038 Other specified hypothyroidism: Secondary | ICD-10-CM | POA: Diagnosis not present

## 2019-11-12 DIAGNOSIS — I5032 Chronic diastolic (congestive) heart failure: Secondary | ICD-10-CM | POA: Diagnosis present

## 2019-11-12 DIAGNOSIS — R54 Age-related physical debility: Secondary | ICD-10-CM | POA: Diagnosis present

## 2019-11-12 DIAGNOSIS — Z79899 Other long term (current) drug therapy: Secondary | ICD-10-CM

## 2019-11-12 DIAGNOSIS — F319 Bipolar disorder, unspecified: Secondary | ICD-10-CM | POA: Diagnosis present

## 2019-11-12 DIAGNOSIS — Z7982 Long term (current) use of aspirin: Secondary | ICD-10-CM

## 2019-11-12 DIAGNOSIS — G8929 Other chronic pain: Secondary | ICD-10-CM | POA: Diagnosis present

## 2019-11-12 DIAGNOSIS — R531 Weakness: Secondary | ICD-10-CM

## 2019-11-12 DIAGNOSIS — G9341 Metabolic encephalopathy: Secondary | ICD-10-CM | POA: Diagnosis present

## 2019-11-12 LAB — CBC
HCT: 37.6 % (ref 36.0–46.0)
Hemoglobin: 12.8 g/dL (ref 12.0–15.0)
MCH: 33.9 pg (ref 26.0–34.0)
MCHC: 34 g/dL (ref 30.0–36.0)
MCV: 99.5 fL (ref 80.0–100.0)
Platelets: 204 10*3/uL (ref 150–400)
RBC: 3.78 MIL/uL — ABNORMAL LOW (ref 3.87–5.11)
RDW: 13.2 % (ref 11.5–15.5)
WBC: 9.3 10*3/uL (ref 4.0–10.5)
nRBC: 0 % (ref 0.0–0.2)

## 2019-11-12 LAB — BLOOD GAS, VENOUS
Acid-Base Excess: 14.4 mmol/L — ABNORMAL HIGH (ref 0.0–2.0)
Bicarbonate: 41.8 mmol/L — ABNORMAL HIGH (ref 20.0–28.0)
O2 Saturation: 43.4 %
Patient temperature: 37
pCO2, Ven: 66 mmHg — ABNORMAL HIGH (ref 44.0–60.0)
pH, Ven: 7.41 (ref 7.250–7.430)
pO2, Ven: <31 mmHg — CL (ref 32.0–45.0)

## 2019-11-12 LAB — BASIC METABOLIC PANEL
Anion gap: 17 — ABNORMAL HIGH (ref 5–15)
BUN: 19 mg/dL (ref 8–23)
CO2: 31 mmol/L (ref 22–32)
Calcium: 9.7 mg/dL (ref 8.9–10.3)
Chloride: 94 mmol/L — ABNORMAL LOW (ref 98–111)
Creatinine, Ser: 0.94 mg/dL (ref 0.44–1.00)
GFR calc Af Amer: >60 mL/min (ref >60)
GFR calc non Af Amer: >60 mL/min (ref >60)
Glucose, Bld: 126 mg/dL — ABNORMAL HIGH (ref 70–99)
Potassium: 3.2 mmol/L — ABNORMAL LOW (ref 3.5–5.1)
Sodium: 142 mmol/L (ref 135–145)

## 2019-11-12 LAB — TSH: TSH: 62.077 u[IU]/mL — ABNORMAL HIGH (ref 0.350–4.500)

## 2019-11-12 LAB — T4, FREE: Free T4: 0.56 ng/dL — ABNORMAL LOW (ref 0.61–1.12)

## 2019-11-12 MED ORDER — LEVOTHYROXINE SODIUM 100 MCG/5ML IV SOLN
200.0000 ug | Freq: Every day | INTRAVENOUS | Status: DC
  Administered 2019-11-12 – 2019-11-14 (×3): 200 ug via INTRAVENOUS
  Filled 2019-11-12 (×4): qty 10

## 2019-11-12 MED ORDER — TRAZODONE HCL 50 MG PO TABS
25.0000 mg | ORAL_TABLET | Freq: Every evening | ORAL | Status: DC | PRN
  Administered 2019-11-15: 25 mg via ORAL
  Filled 2019-11-12: qty 1

## 2019-11-12 MED ORDER — ACETAMINOPHEN 650 MG RE SUPP: 650.0000 mg | Freq: Four times a day (QID) | RECTAL | Status: DC | PRN

## 2019-11-12 MED ORDER — ONDANSETRON HCL 4 MG PO TABS: 4.0000 mg | ORAL_TABLET | Freq: Four times a day (QID) | ORAL | Status: DC | PRN

## 2019-11-12 MED ORDER — ACETAMINOPHEN 325 MG PO TABS
650.0000 mg | ORAL_TABLET | Freq: Four times a day (QID) | ORAL | Status: DC | PRN
  Administered 2019-11-13: 650 mg via ORAL
  Filled 2019-11-12 (×2): qty 2

## 2019-11-12 MED ORDER — ENOXAPARIN SODIUM 40 MG/0.4ML ~~LOC~~ SOLN
40.0000 mg | Freq: Two times a day (BID) | SUBCUTANEOUS | Status: DC
  Administered 2019-11-13: 40 mg via SUBCUTANEOUS
  Filled 2019-11-12 (×2): qty 0.4

## 2019-11-12 MED ORDER — SODIUM CHLORIDE 0.9 % IV SOLN: INTRAVENOUS | Status: DC

## 2019-11-12 MED ORDER — LACTATED RINGERS IV BOLUS
1000.0000 mL | Freq: Once | INTRAVENOUS | Status: AC
  Administered 2019-11-12: 1000 mL via INTRAVENOUS

## 2019-11-12 MED ORDER — POTASSIUM CHLORIDE CRYS ER 20 MEQ PO TBCR
40.0000 meq | EXTENDED_RELEASE_TABLET | Freq: Once | ORAL | Status: AC
  Administered 2019-11-12: 40 meq via ORAL
  Filled 2019-11-12: qty 2

## 2019-11-12 MED ORDER — ONDANSETRON HCL 4 MG/2ML IJ SOLN: 4.0000 mg | Freq: Four times a day (QID) | INTRAMUSCULAR | Status: DC | PRN

## 2019-11-12 MED ORDER — MAGNESIUM HYDROXIDE 400 MG/5ML PO SUSP: 30.0000 mL | Freq: Every day | ORAL | Status: DC | PRN

## 2019-11-12 NOTE — ED Triage Notes (Signed)
First RN Note: pt presents to ED via [facility] with c/o AMS, per EMS pt has not eaten in several days, c/o increased weakness. VSS, currently on 3L via [place].  130/80 CBG 131 HR 77

## 2019-11-12 NOTE — Progress Notes (Signed)
PHARMACIST - PHYSICIAN COMMUNICATION  CONCERNING:  Enoxaparin (Lovenox) for DVT Prophylaxis    RECOMMENDATION: Patient was prescribed enoxaparin 40mg  q24 hours for VTE prophylaxis.   Filed Weights   11/12/19 1348  Weight: 109 kg (240 lb 4.8 oz)    Body mass index is 42.57 kg/m.  Estimated Creatinine Clearance: 65.9 mL/min (by C-G formula based on SCr of 0.94 mg/dL).   Based on [place] patient is candidate for enoxaparin 40mg  every 12 hours due to BMI being >40.  DESCRIPTION: Pharmacy has adjusted enoxaparin dose per [facility] policy.  Patient is now receiving enoxaparin 40 mg every 12 hours    Benita Gutter 11/12/2019 11:41 PM

## 2019-11-12 NOTE — ED Triage Notes (Signed)
See first nurse note. Pt does not know why she's here. Pt states that she feels fine except her chronic back pain.
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
## 2019-11-13 DIAGNOSIS — I480 Paroxysmal atrial fibrillation: Secondary | ICD-10-CM | POA: Diagnosis present

## 2019-11-13 DIAGNOSIS — Z8673 Personal history of transient ischemic attack (TIA), and cerebral infarction without residual deficits: Secondary | ICD-10-CM | POA: Diagnosis not present

## 2019-11-13 DIAGNOSIS — R54 Age-related physical debility: Secondary | ICD-10-CM | POA: Diagnosis present

## 2019-11-13 DIAGNOSIS — E785 Hyperlipidemia, unspecified: Secondary | ICD-10-CM | POA: Diagnosis not present

## 2019-11-13 DIAGNOSIS — F319 Bipolar disorder, unspecified: Secondary | ICD-10-CM | POA: Diagnosis present

## 2019-11-13 DIAGNOSIS — F419 Anxiety disorder, unspecified: Secondary | ICD-10-CM | POA: Diagnosis present

## 2019-11-13 DIAGNOSIS — E876 Hypokalemia: Secondary | ICD-10-CM | POA: Diagnosis present

## 2019-11-13 DIAGNOSIS — Z888 Allergy status to other drugs, medicaments and biological substances status: Secondary | ICD-10-CM | POA: Diagnosis not present

## 2019-11-13 DIAGNOSIS — Z79890 Hormone replacement therapy (postmenopausal): Secondary | ICD-10-CM | POA: Diagnosis not present

## 2019-11-13 DIAGNOSIS — M792 Neuralgia and neuritis, unspecified: Secondary | ICD-10-CM | POA: Diagnosis present

## 2019-11-13 DIAGNOSIS — Z20822 Contact with and (suspected) exposure to covid-19: Secondary | ICD-10-CM | POA: Diagnosis present

## 2019-11-13 DIAGNOSIS — R531 Weakness: Secondary | ICD-10-CM

## 2019-11-13 DIAGNOSIS — G9341 Metabolic encephalopathy: Secondary | ICD-10-CM | POA: Diagnosis not present

## 2019-11-13 DIAGNOSIS — Z9114 Patient's other noncompliance with medication regimen: Secondary | ICD-10-CM | POA: Diagnosis not present

## 2019-11-13 DIAGNOSIS — M109 Gout, unspecified: Secondary | ICD-10-CM | POA: Diagnosis present

## 2019-11-13 DIAGNOSIS — G8929 Other chronic pain: Secondary | ICD-10-CM | POA: Diagnosis present

## 2019-11-13 DIAGNOSIS — G20 Parkinson's disease: Secondary | ICD-10-CM | POA: Diagnosis present

## 2019-11-13 DIAGNOSIS — F0280 Dementia in other diseases classified elsewhere without behavioral disturbance: Secondary | ICD-10-CM | POA: Diagnosis present

## 2019-11-13 DIAGNOSIS — E039 Hypothyroidism, unspecified: Secondary | ICD-10-CM | POA: Diagnosis present

## 2019-11-13 DIAGNOSIS — M549 Dorsalgia, unspecified: Secondary | ICD-10-CM | POA: Diagnosis present

## 2019-11-13 DIAGNOSIS — Z66 Do not resuscitate: Secondary | ICD-10-CM | POA: Diagnosis present

## 2019-11-13 DIAGNOSIS — I5032 Chronic diastolic (congestive) heart failure: Secondary | ICD-10-CM | POA: Diagnosis present

## 2019-11-13 DIAGNOSIS — I110 Hypertensive heart disease with heart failure: Secondary | ICD-10-CM | POA: Diagnosis present

## 2019-11-13 DIAGNOSIS — E038 Other specified hypothyroidism: Principal | ICD-10-CM

## 2019-11-13 DIAGNOSIS — K219 Gastro-esophageal reflux disease without esophagitis: Secondary | ICD-10-CM | POA: Diagnosis present

## 2019-11-13 DIAGNOSIS — R404 Transient alteration of awareness: Secondary | ICD-10-CM

## 2019-11-13 DIAGNOSIS — J9611 Chronic respiratory failure with hypoxia: Secondary | ICD-10-CM | POA: Diagnosis present

## 2019-11-13 LAB — URINALYSIS, COMPLETE (UACMP) WITH MICROSCOPIC
Bilirubin Urine: NEGATIVE
Glucose, UA: NEGATIVE mg/dL
Hgb urine dipstick: NEGATIVE
Ketones, ur: 5 mg/dL — AB
Leukocytes,Ua: NEGATIVE
Nitrite: NEGATIVE
Protein, ur: 30 mg/dL — AB
Specific Gravity, Urine: 1.025 (ref 1.005–1.030)
pH: 6 (ref 5.0–8.0)

## 2019-11-13 LAB — BASIC METABOLIC PANEL
Anion gap: 11 (ref 5–15)
BUN: 18 mg/dL (ref 8–23)
CO2: 34 mmol/L — ABNORMAL HIGH (ref 22–32)
Calcium: 9.2 mg/dL (ref 8.9–10.3)
Chloride: 98 mmol/L (ref 98–111)
Creatinine, Ser: 0.92 mg/dL (ref 0.44–1.00)
GFR calc Af Amer: >60 mL/min (ref >60)
GFR calc non Af Amer: >60 mL/min (ref >60)
Glucose, Bld: 105 mg/dL — ABNORMAL HIGH (ref 70–99)
Potassium: 3.1 mmol/L — ABNORMAL LOW (ref 3.5–5.1)
Sodium: 143 mmol/L (ref 135–145)

## 2019-11-13 LAB — CBC
HCT: 35.6 % — ABNORMAL LOW (ref 36.0–46.0)
Hemoglobin: 11.6 g/dL — ABNORMAL LOW (ref 12.0–15.0)
MCH: 34.2 pg — ABNORMAL HIGH (ref 26.0–34.0)
MCHC: 32.6 g/dL (ref 30.0–36.0)
MCV: 105 fL — ABNORMAL HIGH (ref 80.0–100.0)
Platelets: 169 10*3/uL (ref 150–400)
RBC: 3.39 MIL/uL — ABNORMAL LOW (ref 3.87–5.11)
RDW: 13.2 % (ref 11.5–15.5)
WBC: 8.6 10*3/uL (ref 4.0–10.5)
nRBC: 0 % (ref 0.0–0.2)

## 2019-11-13 LAB — TSH: TSH: 62 u[IU]/mL — ABNORMAL HIGH (ref 0.350–4.500)

## 2019-11-13 LAB — RESPIRATORY PANEL BY RT PCR (FLU A&B, COVID)
Influenza A by PCR: NEGATIVE
Influenza B by PCR: NEGATIVE
SARS Coronavirus 2 by RT PCR: NEGATIVE

## 2019-11-13 LAB — T4, FREE: Free T4: 0.73 ng/dL (ref 0.61–1.12)

## 2019-11-13 MED ORDER — ATORVASTATIN CALCIUM 20 MG PO TABS
40.0000 mg | ORAL_TABLET | Freq: Every evening | ORAL | Status: DC
  Administered 2019-11-13 – 2019-11-15 (×2): 40 mg via ORAL
  Filled 2019-11-13 (×3): qty 2

## 2019-11-13 MED ORDER — ACETAMINOPHEN 500 MG PO TABS: 500.0000 mg | ORAL_TABLET | Freq: Four times a day (QID) | ORAL | Status: DC | PRN

## 2019-11-13 MED ORDER — ONDANSETRON HCL 4 MG PO TABS: 8.0000 mg | ORAL_TABLET | Freq: Two times a day (BID) | ORAL | Status: DC | PRN

## 2019-11-13 MED ORDER — DIVALPROEX SODIUM 125 MG PO CSDR
500.0000 mg | DELAYED_RELEASE_CAPSULE | Freq: Two times a day (BID) | ORAL | Status: DC
  Administered 2019-11-13 – 2019-11-16 (×7): 500 mg via ORAL
  Filled 2019-11-13 (×9): qty 4

## 2019-11-13 MED ORDER — LINACLOTIDE 145 MCG PO CAPS
145.0000 ug | ORAL_CAPSULE | Freq: Every day | ORAL | Status: DC
  Administered 2019-11-14 – 2019-11-16 (×3): 145 ug via ORAL
  Filled 2019-11-13 (×4): qty 1

## 2019-11-13 MED ORDER — POLYETHYLENE GLYCOL 3350 17 G PO PACK
17.0000 g | PACK | Freq: Every day | ORAL | Status: DC
  Administered 2019-11-13 – 2019-11-16 (×4): 17 g via ORAL
  Filled 2019-11-13 (×4): qty 1

## 2019-11-13 MED ORDER — CARBIDOPA-LEVODOPA 25-100 MG PO TABS
2.0000 | ORAL_TABLET | Freq: Three times a day (TID) | ORAL | Status: DC
  Administered 2019-11-13 – 2019-11-16 (×9): 2 via ORAL
  Filled 2019-11-13 (×12): qty 2

## 2019-11-13 MED ORDER — MEMANTINE HCL 5 MG PO TABS
5.0000 mg | ORAL_TABLET | Freq: Every day | ORAL | Status: DC
  Administered 2019-11-13 – 2019-11-15 (×3): 5 mg via ORAL
  Filled 2019-11-13 (×3): qty 1

## 2019-11-13 MED ORDER — GABAPENTIN 100 MG PO CAPS
100.0000 mg | ORAL_CAPSULE | Freq: Three times a day (TID) | ORAL | Status: DC
  Administered 2019-11-13 – 2019-11-14 (×3): 100 mg via ORAL
  Filled 2019-11-13 (×4): qty 1

## 2019-11-13 MED ORDER — OLANZAPINE 10 MG PO TBDP
20.0000 mg | ORAL_TABLET | Freq: Every day | ORAL | Status: DC
  Administered 2019-11-14 – 2019-11-15 (×3): 20 mg via ORAL
  Filled 2019-11-13 (×4): qty 2

## 2019-11-13 MED ORDER — ALLOPURINOL 100 MG PO TABS
100.0000 mg | ORAL_TABLET | Freq: Every day | ORAL | Status: DC
  Administered 2019-11-13 – 2019-11-16 (×4): 100 mg via ORAL
  Filled 2019-11-13 (×5): qty 1

## 2019-11-13 NOTE — ED Notes (Signed)
Pt moved to Rm 37. Pt resting with eyes closed, would respond to questions. No complaints at current time.

## 2019-11-13 NOTE — Progress Notes (Addendum)
PROGRESS NOTE    Kelsey Hanna  HTD:[id number] DOB: [birth year] DOA: 11/12/2019 PCP: [name], FNP    Brief Narrative:  69 year old Caucasian female long-term resident at [facility] assisted living facility, known history of hypothyroidism depression bipolar disorder and dementia who was brought to the emergency department with acute onset of generalized weakness.  He was level of responsiveness noted at her assisted living.  Also diminished p.o. intake over the last couple days and new onset left-sided upper extremity tremors.  The patient has a history of nonadherence to her Synthroid assisted living.  She has previously been admitted to [facility] for myxedema coma.  [facility] was contacted by ED provider on admission and declined transfer.  Patient was admitted and started on IV Synthroid for her severe hypothyroidism.  After initiation of IV Synthroid patient's mental status started to improve.  On my evaluation she states she feels better.  She speaks complete sentences.  Tremors persistent.   Assessment & Plan:   Active Problems:   AMS (altered mental status)  Severe hypothyroidism Acute metabolic encephalopathy secondary to above Medication nonadherence Patient has a known history of medication nonadherence with her Synthroid assisted living History of admission to [facility] for myxedema coma in the past IV Synthroid started in emergency room Plan: Continue IV Synthroid for now And likely transition back to oral Synthroid once mental status improves Continue with IV fluids Follow T3 and T4  Possible Parkinson's disease Patient is on Sinemet but has no knowledge of a diagnosis of Parkinson's She does have a resting tremor but she states this is new Adherence to Sinemet therapy is also in question We will continue Sinemet for now  Chronic hypoxic respiratory failure Patient on baseline 3 L oxygen  Hyperlipidemia Statin  Gout Allopurinol  Bipolar  disorder Depakote and Zyprexa  Dementia On Namenda  Neuropathic pain Continue Neurontin as to not precipitate withdrawals   DVT prophylaxis: Lovenox Code Status: DNR Family Communication: None today.  Offered to call patient's family but patient declined Disposition Plan: Status is: Observation  The patient will require care spanning > 2 midnights and should be moved to inpatient because: Inpatient level of care appropriate due to severity of illness  Dispo: The patient is from: ALF              Anticipated d/c is to: ALF              Anticipated d/c date is: 1 day              Patient currently is not medically stable to d/c.  Still with symptoms consistent with severe hypothyroidism.  Anticipate 1 additional day of inpatient treatment monitoring prior to disposition planning.  Anticipate return to previous leg environment.  No disposition barriers identified at this time.   Consultants:   None  Procedures:   None  Antimicrobials:   None   Subjective: Seen and examined.  More awake and conversant this morning.  No pain complaints.  Endorses some tremor.  Objective: Vitals:   11/13/19 0215 11/13/19 0730 11/13/19 0930 11/13/19 1100  BP:  (!) 113/58 130/66 113/69  Pulse: 75 77 77 69  Resp:  18 18 14   Temp:      TempSrc:      SpO2: 100% 93% 96% 95%  Weight:      Height:        Intake/Output Summary (Last 24 hours) at 11/13/2019 1225 Last data filed at 11/13/2019 1112 Gross per  24 hour  Intake 1811.52 ml  Output 500 ml  Net 1311.52 ml   Filed Weights   11/12/19 1348  Weight: 109 kg    Examination:  General exam: Appears calm and comfortable.  Appears fatigued Respiratory system: Poor respiratory effort.  Lung sounds decreased at bases.  3 L Cardiovascular system: S1-S2.  Regular rate and rhythm.  No murmurs.  No pedal edema  gastrointestinal system: Obese, nontender, nondistended, positive bowel sounds Central nervous system: Alert and oriented.   Resting tremor of upper extremities noted Extremities: Diffusely decreased muscle strength bilaterally Skin: Pale, scattered excoriations Psychiatry: Judgment and insight normal.  Affect flattened    Data Reviewed: I have personally reviewed following labs and imaging studies  CBC: Recent Labs  Lab 11/12/19 1400 11/13/19 0457  WBC 9.3 8.6  HGB 12.8 11.6*  HCT 37.6 35.6*  MCV 99.5 105.0*  PLT 204 662   Basic Metabolic Panel: Recent Labs  Lab 11/12/19 1400 11/13/19 0457  NA 142 143  K 3.2* 3.1*  CL 94* 98  CO2 31 34*  GLUCOSE 126* 105*  BUN 19 18  CREATININE 0.94 0.92  CALCIUM 9.7 9.2   GFR: Estimated Creatinine Clearance: 67.4 mL/min (by C-G formula based on SCr of 0.92 mg/dL). Liver Function Tests: No results for input(s): AST, ALT, ALKPHOS, BILITOT, PROT, ALBUMIN in the last 168 hours. No results for input(s): LIPASE, AMYLASE in the last 168 hours. No results for input(s): AMMONIA in the last 168 hours. Coagulation Profile: No results for input(s): INR, PROTIME in the last 168 hours. Cardiac Enzymes: No results for input(s): CKTOTAL, CKMB, CKMBINDEX, TROPONINI in the last 168 hours. BNP (last 3 results) No results for input(s): PROBNP in the last 8760 hours. HbA1C: No results for input(s): HGBA1C in the last 72 hours. CBG: No results for input(s): GLUCAP in the last 168 hours. Lipid Profile: No results for input(s): CHOL, HDL, LDLCALC, TRIG, CHOLHDL, LDLDIRECT in the last 72 hours. Thyroid Function Tests: Recent Labs    11/13/19 0457  TSH 62.000*  FREET4 0.73   Anemia Panel: No results for input(s): VITAMINB12, FOLATE, FERRITIN, TIBC, IRON, RETICCTPCT in the last 72 hours. Sepsis Labs: No results for input(s): PROCALCITON, LATICACIDVEN in the last 168 hours.  Recent Results (from the past 240 hour(s))  Respiratory Panel by RT PCR (Flu A&B, Covid) - Nasopharyngeal Swab     Status: None   Collection Time: 11/13/19 12:15 AM   Specimen: Nasopharyngeal  Swab  Result Value Ref Range Status   SARS Coronavirus 2 by RT PCR NEGATIVE NEGATIVE Final    Comment: (NOTE) SARS-CoV-2 target nucleic acids are NOT DETECTED.  The SARS-CoV-2 RNA is generally detectable in upper respiratoy specimens during the acute phase of infection. The lowest concentration of SARS-CoV-2 viral copies this assay can detect is 131 copies/mL. A negative result does not preclude SARS-Cov-2 infection and should not be used as the sole basis for treatment or other patient management decisions. A negative result may occur with  improper specimen collection/handling, submission of specimen other than nasopharyngeal swab, presence of viral mutation(s) within the areas targeted by this assay, and inadequate number of viral copies (<131 copies/mL). A negative result must be combined with clinical observations, patient history, and epidemiological information. The expected result is Negative.  Fact Sheet for Patients:  PinkCheek.be  Fact Sheet for Healthcare Providers:  GravelBags.it  This test is no t yet approved or cleared by the [place] FDA and  has been authorized for detection and/or  diagnosis of SARS-CoV-2 by FDA under an Emergency Use Authorization (EUA). This EUA will remain  in effect (meaning this test can be used) for the duration of the COVID-19 declaration under Section 564(b)(1) of the Act, 21 U.S.C. section 360bbb-3(b)(1), unless the authorization is terminated or revoked sooner.     Influenza A by PCR NEGATIVE NEGATIVE Final   Influenza B by PCR NEGATIVE NEGATIVE Final    Comment: (NOTE) The Xpert Xpress SARS-CoV-2/FLU/RSV assay is intended as an aid in  the diagnosis of influenza from Nasopharyngeal swab specimens and  should not be used as a sole basis for treatment. Nasal washings and  aspirates are unacceptable for Xpert Xpress SARS-CoV-2/FLU/RSV  testing.  Fact Sheet for  Patients: PinkCheek.be  Fact Sheet for Healthcare Providers: GravelBags.it  This test is not yet approved or cleared by the [place] FDA and  has been authorized for detection and/or diagnosis of SARS-CoV-2 by  FDA under an Emergency Use Authorization (EUA). This EUA will remain  in effect (meaning this test can be used) for the duration of the  Covid-19 declaration under Section 564(b)(1) of the Act, 21  U.S.C. section 360bbb-3(b)(1), unless the authorization is  terminated or revoked. Performed at [facility], [address], [place], [place] [address]          Radiology Studies: CT Head Wo Contrast  Result Date: 11/12/2019 CLINICAL DATA:  Mental status change, increasing weakness and confusion EXAM: CT HEAD WITHOUT CONTRAST TECHNIQUE: Contiguous axial images were obtained from the base of the skull through the vertex without intravenous contrast. COMPARISON:  CT 10/28/2019 FINDINGS: Brain: No evidence of acute infarction, hemorrhage, hydrocephalus, extra-axial collection, visible mass lesion or mass effect. Cavum septum pellucidum is noted, benign incidental finding. Symmetric prominence of the ventricles, cisterns and sulci compatible with parenchymal volume loss. Patchy areas of white matter hypoattenuation are most compatible with moderate chronic microvascular angiopathy. Vascular: Atherosclerotic calcification of the carotid siphons and intradural vertebral arteries. No hyperdense vessel. Skull: Mild residual scalp thickening of the left frontoparietal scalp (2/20). No subjacent calvarial fracture or other acute or suspicious osseous abnormality. Sinuses/Orbits: Paranasal sinuses and mastoid air cells are predominantly clear. Orbital structures are unremarkable aside from prior lens extractions. Other: None IMPRESSION: 1. No acute intracranial findings. 2. Moderate parenchymal volume loss and chronic  microvascular angiopathy. 3. Mild residual scalp thickening of the left frontoparietal scalp. No subjacent calvarial fracture. Electronically Signed   By: Lovena Le M.D.   On: 11/12/2019 20:00   DG Chest Portable 1 View  Result Date: 11/12/2019 CLINICAL DATA:  Weakness. EXAM: PORTABLE CHEST 1 VIEW COMPARISON:  October 28, 2019 FINDINGS: Cardiomediastinal silhouette is normal. Mediastinal contours appear intact. Calcific atherosclerotic disease of the aorta. Chronic elevation of left hemidiaphragm. There is no evidence of focal airspace consolidation, pleural effusion or pneumothorax. Osseous structures are without acute abnormality. Soft tissues are grossly normal. IMPRESSION: 1. No active disease. 2. Chronic elevation of left hemidiaphragm. Electronically Signed   By: Fidela Salisbury M.D.   On: 11/12/2019 19:25        Scheduled Meds: . enoxaparin (LOVENOX) injection  40 mg Subcutaneous Q12H  . levothyroxine  200 mcg Intravenous Daily   Continuous Infusions: . sodium chloride 100 mL/hr at 11/13/19 1014     LOS: 0 days    Time spent: 25 minutes    Sidney Ace, MD [facility] Pager [phone]  If 7PM-7AM, please contact night-coverage 11/13/2019, 12:25 PM

## 2019-11-13 NOTE — ED Notes (Signed)
Daughter updated. Daughter concerned about pt not taking her synthroid, but pleasant.

## 2019-11-13 NOTE — ED Notes (Signed)
MD notified about retention

## 2019-11-13 NOTE — ED Notes (Signed)
Pt refused lunch and dinner. Remain A&O x3, No complaint of pain or illness. No urine output during shift

## 2019-11-13 NOTE — ED Notes (Signed)
Pt resting in NAD. Fluids running. VSS.

## 2019-11-13 NOTE — Progress Notes (Signed)
PT Cancellation Note  Patient Details Name: Kelsey Hanna MRN: [id number] DOB: Nov 15, 1950   Cancelled Treatment:    Reason Eval/Treat Not Completed: Other (comment).  PT consult received.  Chart reviewed.  Upon arrival to pt's room, pt resting in bed with eyes closed; lunch tray in front of pt but no food appeared to have been eaten.  Pt initially opening her eyes and saying hi back to therapist but then pt stopped talking with therapist and closed her eyes.  Eventually able to get pt to talk to therapist but pt refusing any activity (pt just stating that "this is dumb" and "this is lame" with any therapy suggestion).  With encouragement pt briefly appearing willing to participate but then pt quickly refusing to do anything with therapist.  Pt then c/o 10/10 headache and requesting pain medication (nurse notified).  Will re-attempt PT evaluation at a later date/time.  Leitha Bleak, PT 11/13/19, 4:27 PM

## 2019-11-14 LAB — BASIC METABOLIC PANEL
Anion gap: 8 (ref 5–15)
BUN: 14 mg/dL (ref 8–23)
CO2: 31 mmol/L (ref 22–32)
Calcium: 8.8 mg/dL — ABNORMAL LOW (ref 8.9–10.3)
Chloride: 106 mmol/L (ref 98–111)
Creatinine, Ser: 0.84 mg/dL (ref 0.44–1.00)
GFR calc Af Amer: >60 mL/min (ref >60)
GFR calc non Af Amer: >60 mL/min (ref >60)
Glucose, Bld: 92 mg/dL (ref 70–99)
Potassium: 3.5 mmol/L (ref 3.5–5.1)
Sodium: 145 mmol/L (ref 135–145)

## 2019-11-14 LAB — CBC WITH DIFFERENTIAL/PLATELET
Abs Immature Granulocytes: 0.02 10*3/uL (ref 0.00–0.07)
Basophils Absolute: 0 10*3/uL (ref 0.0–0.1)
Basophils Relative: 1 %
Eosinophils Absolute: 0.1 10*3/uL (ref 0.0–0.5)
Eosinophils Relative: 1 %
HCT: 32.5 % — ABNORMAL LOW (ref 36.0–46.0)
Hemoglobin: 10.6 g/dL — ABNORMAL LOW (ref 12.0–15.0)
Immature Granulocytes: 0 %
Lymphocytes Relative: 33 %
Lymphs Abs: 2.1 10*3/uL (ref 0.7–4.0)
MCH: 33.3 pg (ref 26.0–34.0)
MCHC: 32.6 g/dL (ref 30.0–36.0)
MCV: 102.2 fL — ABNORMAL HIGH (ref 80.0–100.0)
Monocytes Absolute: 0.5 10*3/uL (ref 0.1–1.0)
Monocytes Relative: 8 %
Neutro Abs: 3.6 10*3/uL (ref 1.7–7.7)
Neutrophils Relative %: 57 %
Platelets: 169 10*3/uL (ref 150–400)
RBC: 3.18 MIL/uL — ABNORMAL LOW (ref 3.87–5.11)
RDW: 13 % (ref 11.5–15.5)
WBC: 6.4 10*3/uL (ref 4.0–10.5)
nRBC: 0 % (ref 0.0–0.2)

## 2019-11-14 LAB — HIV ANTIBODY (ROUTINE TESTING W REFLEX): HIV Screen 4th Generation wRfx: NONREACTIVE

## 2019-11-14 LAB — TSH: TSH: 43.358 u[IU]/mL — ABNORMAL HIGH (ref 0.350–4.500)

## 2019-11-14 MED ORDER — SODIUM CHLORIDE 0.9 % IV BOLUS
1000.0000 mL | Freq: Once | INTRAVENOUS | Status: AC
  Administered 2019-11-14: 1000 mL via INTRAVENOUS

## 2019-11-14 MED ORDER — ENOXAPARIN SODIUM 40 MG/0.4ML ~~LOC~~ SOLN
40.0000 mg | SUBCUTANEOUS | Status: DC
  Administered 2019-11-14 – 2019-11-15 (×2): 40 mg via SUBCUTANEOUS
  Filled 2019-11-14 (×2): qty 0.4

## 2019-11-14 MED ORDER — TAMSULOSIN HCL 0.4 MG PO CAPS
0.4000 mg | ORAL_CAPSULE | Freq: Every day | ORAL | Status: DC
  Administered 2019-11-15: 0.4 mg via ORAL
  Filled 2019-11-14 (×2): qty 1

## 2019-11-14 MED ORDER — LEVOTHYROXINE SODIUM 150 MCG PO TABS
150.0000 ug | ORAL_TABLET | Freq: Every day | ORAL | Status: DC
  Administered 2019-11-15 – 2019-11-16 (×2): 150 ug via ORAL
  Filled 2019-11-14 (×2): qty 1
  Filled 2019-11-14: qty 3
  Filled 2019-11-14: qty 1
  Filled 2019-11-14: qty 3

## 2019-11-14 NOTE — Progress Notes (Signed)
PROGRESS NOTE    Kelsey Hanna  NTZ:[id number] DOB: [birth year] DOA: 11/12/2019 PCP: [name], FNP    Brief Narrative:  69 year old Caucasian female long-term resident at [facility] assisted [facility] facility, known history of hypothyroidism depression bipolar disorder and dementia who was brought to the emergency department with acute onset of generalized weakness.  He was level of responsiveness noted at her assisted living.  Also diminished p.o. intake over the last couple days and new onset left-sided upper extremity tremors.  The patient has a history of nonadherence to her Synthroid assisted living.  She has previously been admitted to [facility] for myxedema coma.  [facility] was contacted by ED provider on admission and declined transfer.  Patient was admitted and started on IV Synthroid for her severe hypothyroidism.  After initiation of IV Synthroid patient's mental status started to improve.  On my evaluation she states she feels better.  She speaks complete sentences.  Tremors persistent.  9/27: Patient was little bit more lethargic and confused this morning.  Does answer all questions appropriately but apparently is only alert and oriented x1.  Still tremulous.   Assessment & Plan:   Active Problems:   AMS (altered mental status)   Severe hypothyroidism  Severe hypothyroidism Acute metabolic encephalopathy secondary to above Medication nonadherence Patient has a known history of medication nonadherence with her Synthroid History of admission to [facility] for myxedema coma in the past IV Synthroid started in emergency room Seems to be slowly responding to treatment Plan: Continue IV Synthroid for now.  Last dose of IV Synthroid today.  Can resume home regimen of oral Synthroid tomorrow Hold IV fluids  Possible Parkinson's disease Patient is on Sinemet but has no knowledge of a diagnosis of Parkinson's She does have a resting tremor but she states this is  new Adherence to Sinemet therapy is also in question We will continue Sinemet for now Monitor tremulousness  Chronic hypoxic respiratory failure Patient on baseline 3 L oxygen  Hyperlipidemia Statin  Gout Allopurinol  Bipolar disorder Depakote and Zyprexa  Dementia On Namenda  Neuropathic pain Continue Neurontin as to not precipitate withdrawals   DVT prophylaxis: Lovenox Code Status: DNR Family Communication:  Disposition Plan: Status is: Inpatient  Remains inpatient appropriate because:Altered mental status and Inpatient level of care appropriate due to severity of illness   Dispo: The patient is from: ALF              Anticipated d/c is to: SNF              Anticipated d/c date is: 1 day              Patient currently is not medically stable to d/c.   Still with symptoms concerning for acute severe hypothyroidism.  Continues on IV Synthroid.  Anticipate medical readiness for discharge within 1 to 2 days.  Need mental status to return to baseline patient tolerate p.o. without issues.  Consultants:   None  Procedures:   None  Antimicrobials:   None   Subjective: Seen and examined.  Complaining of pain in bilateral shoulders this morning  Objective: Vitals:   11/14/19 0024 11/14/19 0459 11/14/19 0700 11/14/19 0830  BP: (!) 127/99 (!) 125/59  (!) 142/70  Pulse: 73 66  74  Resp: 20 16  18   Temp: 97.7 F (36.5 C) (!) 97.5 F (36.4 C) 97.8 F (36.6 C)   TempSrc: Oral Oral Oral   SpO2: 99% 100%  100%  Weight:      Height:        Intake/Output Summary (Last 24 hours) at 11/14/2019 1142 Last data filed at 11/14/2019 0600 Gross per 24 hour  Intake 976.7 ml  Output 500 ml  Net 476.7 ml   Filed Weights   11/12/19 1348 11/13/19 2229  Weight: 109 kg 98.8 kg    Examination:  General exam: Appears calm and comfortable.  Appears tremulous Respiratory system: Poor respiratory effort.  Lung sounds decreased at bases.  3 L  cardiovascular system:  S1-S2.  Regular rate and rhythm.  No murmurs.  No pedal edema  gastrointestinal system: Obese, nontender, nondistended, positive bowel sounds Central nervous system: Alert, oriented to person and place.  Resting tremor noted  extremities: Diffusely decreased muscle strength bilaterally Skin: Pale, scattered excoriations Psychiatry: Judgment and insight normal.  Affect flattened    Data Reviewed: I have personally reviewed following labs and imaging studies  CBC: Recent Labs  Lab 11/12/19 1400 11/13/19 0457 11/14/19 0851  WBC 9.3 8.6 6.4  NEUTROABS  --   --  3.6  HGB 12.8 11.6* 10.6*  HCT 37.6 35.6* 32.5*  MCV 99.5 105.0* 102.2*  PLT 204 169 734   Basic Metabolic Panel: Recent Labs  Lab 11/12/19 1400 11/13/19 0457 11/14/19 0851  NA 142 143 145  K 3.2* 3.1* 3.5  CL 94* 98 106  CO2 31 34* 31  GLUCOSE 126* 105* 92  BUN 19 18 14   CREATININE 0.94 0.92 0.84  CALCIUM 9.7 9.2 8.8*   GFR: Estimated Creatinine Clearance: 69.8 mL/min (by C-G formula based on SCr of 0.84 mg/dL). Liver Function Tests: No results for input(s): AST, ALT, ALKPHOS, BILITOT, PROT, ALBUMIN in the last 168 hours. No results for input(s): LIPASE, AMYLASE in the last 168 hours. No results for input(s): AMMONIA in the last 168 hours. Coagulation Profile: No results for input(s): INR, PROTIME in the last 168 hours. Cardiac Enzymes: No results for input(s): CKTOTAL, CKMB, CKMBINDEX, TROPONINI in the last 168 hours. BNP (last 3 results) No results for input(s): PROBNP in the last 8760 hours. HbA1C: No results for input(s): HGBA1C in the last 72 hours. CBG: No results for input(s): GLUCAP in the last 168 hours. Lipid Profile: No results for input(s): CHOL, HDL, LDLCALC, TRIG, CHOLHDL, LDLDIRECT in the last 72 hours. Thyroid Function Tests: Recent Labs    11/13/19 0457 11/13/19 0457 11/14/19 0851  TSH 62.000*   < > 43.358*  FREET4 0.73  --   --    < > = values in this interval not displayed.    Anemia Panel: No results for input(s): VITAMINB12, FOLATE, FERRITIN, TIBC, IRON, RETICCTPCT in the last 72 hours. Sepsis Labs: No results for input(s): PROCALCITON, LATICACIDVEN in the last 168 hours.  Recent Results (from the past 240 hour(s))  Respiratory Panel by RT PCR (Flu A&B, Covid) - Nasopharyngeal Swab     Status: None   Collection Time: 11/13/19 12:15 AM   Specimen: Nasopharyngeal Swab  Result Value Ref Range Status   SARS Coronavirus 2 by RT PCR NEGATIVE NEGATIVE Final    Comment: (NOTE) SARS-CoV-2 target nucleic acids are NOT DETECTED.  The SARS-CoV-2 RNA is generally detectable in upper respiratoy specimens during the acute phase of infection. The lowest concentration of SARS-CoV-2 viral copies this assay can detect is 131 copies/mL. A negative result does not preclude SARS-Cov-2 infection and should not be used as the sole basis for treatment or other patient management decisions. A negative result may occur  with  improper specimen collection/handling, submission of specimen other than nasopharyngeal swab, presence of viral mutation(s) within the areas targeted by this assay, and inadequate number of viral copies (<131 copies/mL). A negative result must be combined with clinical observations, patient history, and epidemiological information. The expected result is Negative.  Fact Sheet for Patients:  PinkCheek.be  Fact Sheet for Healthcare Providers:  GravelBags.it  This test is no t yet approved or cleared by the [place] FDA and  has been authorized for detection and/or diagnosis of SARS-CoV-2 by FDA under an Emergency Use Authorization (EUA). This EUA will remain  in effect (meaning this test can be used) for the duration of the COVID-19 declaration under Section 564(b)(1) of the Act, 21 U.S.C. section 360bbb-3(b)(1), unless the authorization is terminated or revoked sooner.     Influenza A by  PCR NEGATIVE NEGATIVE Final   Influenza B by PCR NEGATIVE NEGATIVE Final    Comment: (NOTE) The Xpert Xpress SARS-CoV-2/FLU/RSV assay is intended as an aid in  the diagnosis of influenza from Nasopharyngeal swab specimens and  should not be used as a sole basis for treatment. Nasal washings and  aspirates are unacceptable for Xpert Xpress SARS-CoV-2/FLU/RSV  testing.  Fact Sheet for Patients: PinkCheek.be  Fact Sheet for Healthcare Providers: GravelBags.it  This test is not yet approved or cleared by the [place] FDA and  has been authorized for detection and/or diagnosis of SARS-CoV-2 by  FDA under an Emergency Use Authorization (EUA). This EUA will remain  in effect (meaning this test can be used) for the duration of the  Covid-19 declaration under Section 564(b)(1) of the Act, 21  U.S.C. section 360bbb-3(b)(1), unless the authorization is  terminated or revoked. Performed at [facility], [address], [place], [place] [address]          Radiology Studies: CT Head Wo Contrast  Result Date: 11/12/2019 CLINICAL DATA:  Mental status change, increasing weakness and confusion EXAM: CT HEAD WITHOUT CONTRAST TECHNIQUE: Contiguous axial images were obtained from the base of the skull through the vertex without intravenous contrast. COMPARISON:  CT 10/28/2019 FINDINGS: Brain: No evidence of acute infarction, hemorrhage, hydrocephalus, extra-axial collection, visible mass lesion or mass effect. Cavum septum pellucidum is noted, benign incidental finding. Symmetric prominence of the ventricles, cisterns and sulci compatible with parenchymal volume loss. Patchy areas of white matter hypoattenuation are most compatible with moderate chronic microvascular angiopathy. Vascular: Atherosclerotic calcification of the carotid siphons and intradural vertebral arteries. No hyperdense vessel. Skull: Mild residual scalp  thickening of the left frontoparietal scalp (2/20). No subjacent calvarial fracture or other acute or suspicious osseous abnormality. Sinuses/Orbits: Paranasal sinuses and mastoid air cells are predominantly clear. Orbital structures are unremarkable aside from prior lens extractions. Other: None IMPRESSION: 1. No acute intracranial findings. 2. Moderate parenchymal volume loss and chronic microvascular angiopathy. 3. Mild residual scalp thickening of the left frontoparietal scalp. No subjacent calvarial fracture. Electronically Signed   By: Lovena Le M.D.   On: 11/12/2019 20:00   DG Chest Portable 1 View  Result Date: 11/12/2019 CLINICAL DATA:  Weakness. EXAM: PORTABLE CHEST 1 VIEW COMPARISON:  October 28, 2019 FINDINGS: Cardiomediastinal silhouette is normal. Mediastinal contours appear intact. Calcific atherosclerotic disease of the aorta. Chronic elevation of left hemidiaphragm. There is no evidence of focal airspace consolidation, pleural effusion or pneumothorax. Osseous structures are without acute abnormality. Soft tissues are grossly normal. IMPRESSION: 1. No active disease. 2. Chronic elevation of left hemidiaphragm. Electronically Signed   By:  Fidela Salisbury M.D.   On: 11/12/2019 19:25        Scheduled Meds:  allopurinol  100 mg Oral Daily   atorvastatin  40 mg Oral QPM   carbidopa-levodopa  2 tablet Oral TID   divalproex  500 mg Oral BID   enoxaparin (LOVENOX) injection  40 mg Subcutaneous Q24H   gabapentin  100 mg Oral TID   levothyroxine  200 mcg Intravenous Daily   linaclotide  145 mcg Oral QAC breakfast   memantine  5 mg Oral QHS   OLANZapine zydis  20 mg Oral QHS   polyethylene glycol  17 g Oral Daily   Continuous Infusions:    LOS: 1 day    Time spent: 25 minutes    Sidney Ace, MD [facility] Pager [phone]  If 7PM-7AM, please contact night-coverage 11/14/2019, 11:42 AM

## 2019-11-14 NOTE — Progress Notes (Signed)
Patient very drowsy.  Easily awakes and then falls back to sleep.  Patient bladder scanned for 553ml and straight catheterized for 522ml. Patient tolerated well. Patient took PO meds and fell back to sleep.  MD notified of drowsiness and urinary retention.

## 2019-11-14 NOTE — Evaluation (Signed)
Occupational Therapy Evaluation Patient Details Name: Kelsey Hanna MRN: [id number] DOB: 04/30/1950 Today's Date: 11/14/2019    History of Present Illness Ayda Tancredi is a 69 year old Caucasian female long-term resident at [facility] assisted [facility] facility, known history of hypothyroidism, legal blindness, depression, bipolar disorder, and dementia who was brought to the emergency department with acute onset of generalized weakness. Also diminished p.o. intake over the last couple days and new onset left-sided upper extremity tremors.  The patient has a history of nonadherence to her [facility] assisted living.  She has previously been admitted to [facility] for myxedema coma. Patient was admitted and started on IV Synthroid for her severe hypothyroidism.   Clinical Impression   Ms Kowal was seen for OT/PT co-evaluation this date. Prior to hospital admission, pt was resident at [facility]. Pt reports requiring assist for ADLs 2/2 being legally blind and Independent with w/c transfers. Pt oriented to self only. Briefly states no/yes to questions however inconsistent t/o session. Pt presents to acute OT demonstrating impaired ADL performance, functional cognition, and functional mobility 2/2 decreased acitvity tolerance, pain, functional strength/ROM/balance deficits, and decreased safety awareness. Pt currently requires MIN A self-drinking seated EOB - assist for tremor. MAX A for LBD at bed level. MOD A doff briefs at bed level - pt bridges w/o assistance. MOD A x2 for bed mobility. Pt initiates standing trial c RW and +2 then stops stating pain in B feet - attempted x3 trials and unable to clear rear. Pt would benefit from skilled OT to address noted impairments and functional limitations (see below for any additional details) in order to maximize safety and independence while minimizing falls risk and caregiver burden. Upon hospital discharge, recommend STR to maximize pt  safety and return to PLOF.     Follow Up Recommendations  SNF    Equipment Recommendations  Other (comment) (TBD at next venue of care)    Recommendations for Other Services       Precautions / Restrictions Precautions Precautions: Fall Precaution Comments: Legally blind Restrictions Weight Bearing Restrictions: No      Mobility Bed Mobility Overal bed mobility: Needs Assistance Bed Mobility: Rolling;Supine to Sit;Sit to Supine Rolling: Mod assist;+2 for physical assistance   Supine to sit: Mod assist;+2 for physical assistance;HOB elevated Sit to supine: Mod assist;+2 for physical assistance   General bed mobility comments: Initiates BLE movement c VCs however difficulty following 2 step commands - ultimately requires MOD A x2 to sit EOB  Transfers Overall transfer level: Needs assistance Equipment used: Rolling walker (2 wheeled) Transfers: Sit to/from Stand Sit to Stand: +2 physical assistance   General transfer comment: Pt initiates then stops stating pain in B feet - attempted x3 trials and unable to clear rear with +2    Balance Overall balance assessment: Needs assistance Sitting-balance support: No upper extremity supported;Feet supported Sitting balance-Leahy Scale: Fair Sitting balance - Comments: Initial posteroir lean requiring MOD A decreasing to SBA for static sitting EOB once feet able to touch floor         ADL either performed or assessed with clinical judgement   ADL Overall ADL's : Needs assistance/impaired        General ADL Comments: MIN A self-drinking seated EOB - assist for tremor. MAX A for LBD at bed level. MOD A doff briefs at bed level - pt bridges w/o assistance     Vision Baseline Vision/History: Legally blind  Pertinent Vitals/Pain Pain Assessment: Faces Faces Pain Scale: Hurts even more Pain Location: B feet Pain Descriptors / Indicators: Discomfort;Headache;Guarding Pain Intervention(s): Limited activity  within patient's tolerance;Monitored during session;Repositioned;Other (comment) (RN notified pt reporting pain c WBing in feet )     Hand Dominance Left   Extremity/Trunk Assessment Upper Extremity Assessment Upper Extremity Assessment: RUE deficits/detail;LUE deficits/detail RUE Deficits / Details: Severe tremors noted. Grip 3/5. AROM shoulder flexion ~90*, elbow flexion WFL  LUE Deficits / Details: Severe tremors noted. c/o L shoulder pain c mobility. Grip 3/5. AROM shoulder flexion ~80*, elbow flexion [facility]    Lower Extremity Assessment Lower Extremity Assessment: Generalized weakness       Communication Communication Communication: Other (comment) (Largely states no/yes)   Cognition Arousal/Alertness: Awake/alert Behavior During Therapy: Flat affect Overall Cognitive Status: No family/caregiver present to determine baseline cognitive functioning        General Comments: Oriented to self only. Will state yes/no for questions but changes answers t/o session    General Comments  Unable to read SpO2 this date 2/2 BUE tremors - appears in NAD     Exercises Exercises: Other exercises Other Exercises Other Exercises: Pt educated re: OT role, falls prevention, ECS, importance of mobility for functional strengthening  Other Exercises: Self-drinking, LBD, sup<>sit, rolling, doff briefs, sitting balance/tolerance        Home Living Family/patient expects to be discharged to:: Assisted living       Additional Comments: Pt is long term resident of [facility].       Prior Functioning/Environment Level of Independence: Needs assistance  Gait / Transfers Assistance Needed: Pt is unreliable historian but reports chair and w/c transfers w/o AD.  ADL's / Homemaking Assistance Needed: Pt reports requiring assist for ADLs r/t legal blindness            OT Problem List: Decreased strength;Decreased range of motion;Decreased activity tolerance;Impaired balance (sitting  and/or standing);Decreased safety awareness;Decreased knowledge of use of DME or AE;Impaired UE functional use      OT Treatment/Interventions: Self-care/ADL training;Therapeutic exercise;Energy conservation;DME and/or AE instruction;Therapeutic activities;Patient/family education;Balance training    OT Goals(Current goals can be found in the care plan section) Acute Rehab OT Goals Patient Stated Goal: To eat breakfast  OT Goal Formulation: With patient Time For Goal Achievement: 11/28/19 Potential to Achieve Goals: Fair ADL Goals Pt Will Perform Eating: with set-up;with min assist;bed level Pt Will Transfer to Toilet: with mod assist (rolling at bed level) Pt/caregiver will Perform Home Exercise Program: Increased ROM;Increased strength;Both right and left upper extremity (c VCs PRN)  OT Frequency: Min 1X/week   Barriers to D/C: Decreased caregiver support          Co-evaluation PT/OT/SLP Co-Evaluation/Treatment: Yes Reason for Co-Treatment: Complexity of the patient's impairments (multi-system involvement);Necessary to address cognition/behavior during functional activity;To address functional/ADL transfers PT goals addressed during session: Mobility/safety with mobility;Balance;Proper use of DME OT goals addressed during session: ADL's and self-care;Proper use of Adaptive equipment and DME      AM-PAC OT "6 Clicks" Daily Activity     Outcome Measure Help from another person eating meals?: A Lot Help from another person taking care of personal grooming?: A Lot Help from another person toileting, which includes using toliet, bedpan, or urinal?: A Lot Help from another person bathing (including washing, rinsing, drying)?: A Lot Help from another person to put on and taking off regular upper body clothing?: A Lot Help from another person to put on and taking off regular lower body  clothing?: A Lot 6 Click Score: 12   End of Session Equipment Utilized During Treatment: Rolling  walker;Oxygen (2L [place]) Nurse Communication: Mobility status;Patient requests pain meds  Activity Tolerance: Patient limited by pain Patient left: in bed;with call bell/phone within reach;with bed alarm set;with nursing/sitter in room  OT Visit Diagnosis: Other abnormalities of gait and mobility (R26.89);Muscle weakness (generalized) (M62.81)                Time: 1610-9604 OT Time Calculation (min): 29 min Charges:  OT General Charges $OT Visit: 1 Visit OT Evaluation $OT Eval Moderate Complexity: 1 Mod OT Treatments $Self Care/Home Management : 8-22 mins  Dessie Coma, M.S. OTR/L  11/14/19, 10:15 AM  ascom [phone]

## 2019-11-14 NOTE — Progress Notes (Signed)
PHARMACIST - PHYSICIAN COMMUNICATION  CONCERNING:  Enoxaparin (Lovenox) for DVT Prophylaxis    RECOMMENDATION: Patient was prescribed enoxaparin 40mg  q12 hours for VTE prophylaxis.   Filed Weights   11/12/19 1348 11/13/19 2229  Weight: 109 kg (240 lb 4.8 oz) 98.8 kg (217 lb 13 oz)    Body mass index is 38.58 kg/m.  Estimated Creatinine Clearance: 63.8 mL/min (by C-G formula based on SCr of 0.92 mg/dL).   Based on [place] patient is candidate for enoxaparin 40mg  every 24 hours due to BMI being now <40.  DESCRIPTION: Pharmacy has adjusted enoxaparin dose per [facility] policy.  Patient is now receiving enoxaparin 40 mg every 24 hours    Leen Tworek A 11/14/2019 8:36 AM

## 2019-11-14 NOTE — TOC Initial Note (Signed)
Transition of Care [facility]) - Initial/Assessment Note    Patient Details  Name: Kelsey Hanna MRN: [id number] Date of Birth: June 15, 1950  Transition of Care [facility]) CM/SW Contact:    Candie Chroman, LCSW Phone Number: 11/14/2019, 11:47 AM  Clinical Narrative: Patient not fully oriented. Per chart, patient is a resident at [facility]. CSW called and spoke to her med tech, [place], who confirmed. Patient was not getting home health services prior to admission. She used a rolling walker at baseline and is on 3 L oxygen chronic. CSW notified Monique that OT is recommending SNF placement. PT evaluation still pending. Will follow up with family and ALF once recommendations are made. No further concerns. CSW will continue to follow patient for support and facilitate discharge to ALF vs. SNF once medically stable.               Expected Discharge Plan: Skilled Nursing Facility Barriers to Discharge: Continued Medical Work up   Patient Goals and CMS Choice Patient states their goals for this hospitalization and ongoing recovery are:: Patient not fully oriented.      Expected Discharge Plan and Services Expected Discharge Plan: [place] arrangements for the past 2 months: [place]                                      Prior Living Arrangements/Services Living arrangements for the past 2 months: [place] Lives with:: Facility Resident Patient language and need for interpreter reviewed:: Yes Do you feel safe going back to the place where you live?: Yes      Need for Family Participation in Patient Care: Yes (Comment) Care giver support system in place?: Yes (comment) Current home services: DME Criminal Activity/Legal Involvement Pertinent to Current Situation/Hospitalization: No - Comment as needed  Activities of Daily Living Home Assistive Devices/Equipment: None ADL Screening (condition at time of  admission) Patient's cognitive ability adequate to safely complete daily activities?: Yes Is the patient deaf or have difficulty hearing?: No Does the patient have difficulty seeing, even when wearing glasses/contacts?: Yes Does the patient have difficulty concentrating, remembering, or making decisions?: Yes Patient able to express need for assistance with ADLs?: Yes Does the patient have difficulty dressing or bathing?: Yes Independently performs ADLs?: Yes (appropriate for developmental age) Does the patient have difficulty walking or climbing stairs?: Yes Weakness of Legs: Both Weakness of Arms/Hands: Both  Permission Sought/Granted Permission sought to share information with : Investment banker, corporate granted to share info w AGENCY:  House ALF        Emotional Assessment Appearance:: Appears stated age Attitude/Demeanor/Rapport: Unable to Assess Affect (typically observed): Unable to Assess Orientation: : Oriented to Self Alcohol / Substance Use: Not Applicable Psych Involvement: No (comment)  Admission diagnosis:  Severe hypothyroidism [E03.8] Generalized weakness [R53.1] Hypothyroidism, unspecified type [E03.9] AMS (altered mental status) [R41.82] Patient Active Problem List   Diagnosis Date Noted  . Severe hypothyroidism 11/13/2019  . AMS (altered mental status) 11/12/2019  . Memory loss 07/14/2017  . Weakness 06/11/2017  . Chronic pain 06/11/2017  . Sepsis ([place]) 05/03/2017  . MRSA bacteremia   . Brain aneurysm 03/13/2017  . Internal carotid aneurysm   . Slurred speech 03/12/2017  . Hypotension 03/12/2017  . Dysarthria   . Left sided numbness   . Acute  left-sided muscle weakness 01/24/2017  . Stroke (cerebrum) ([place]) 01/24/2017  . Intracranial aneurysm 01/24/2017  . Encounter for medication review 02/07/2016  . Hypoxia, sleep related 09/07/2015  . Constipation 07/30/2015  . Dizziness 07/30/2015  . Chronic diastolic CHF  (congestive heart failure) ([place]) 06/26/2015  . Bilateral lower extremity edema 06/26/2015  . Mitral regurgitation 05/17/2015  . Accidental drug overdose 05/17/2015  . Respiratory failure with hypoxia and hypercapnia ([place]) 05/09/2015  . OSA (obstructive sleep apnea) 05/09/2015  . Overdose 05/09/2015  . Chronic back pain 03/20/2015  . Right shoulder pain 11/14/2014  . Insomnia 11/14/2014  . Psychoses ([place])   . Bacteremia   . Pedal edema 09/12/2014  . Bipolar disorder, current episode manic without psychotic features, severe ([place])   . Bipolar disorder ([place]) 09/05/2014  . Bipolar affective disorder, current episode manic with psychotic symptoms ([place]) 09/05/2014  . Bipolar affective disorder, manic ([place]) 09/03/2014  . Encounter for preadmission testing   . Head revolving around 07/27/2014  . BP (high blood pressure) 05/23/2014  . AF (paroxysmal atrial fibrillation) ([place]) 05/23/2014  . Type 2 diabetes mellitus ([place]) 04/27/2014  . Essential hypertension 04/27/2014  . Hypothyroidism 04/27/2014  . Tobacco abuse 04/27/2014  . Bipolar I disorder ([place]) 04/20/2014  . Acute encephalopathy 04/18/2014  . Acute respiratory failure with hypoxia ([place])   . Encounter for feeding tube placement   . Shortness of breath   . Gout 08/25/2013  . 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 HLD (hyperlipidemia) 08/30/2009   PCP:  [name], [place] Pharmacy:   [place], [address] [place] [address] Phone: [phone] Fax: [phone]     Social Determinants of Health (SDOH) Interventions    Readmission Risk Interventions No flowsheet data found.

## 2019-11-14 NOTE — Evaluation (Signed)
Physical Therapy Evaluation Patient Details Name: Kelsey Hanna MRN: [id number] DOB: [birth year] Today's Date: 11/14/2019   History of Present Illness  Pt is a 69 year old Caucasian female long-term resident at [facility] assisted [facility] facility, known history of hypothyroidism, legal blindness, depression, bipolar disorder, 3 L chronic home O2, and dementia who was brought to the emergency department with acute onset of generalized weakness. Also diminished p.o. intake over the last couple days and new onset left-sided upper extremity tremors.  The patient has a history of nonadherence to her [facility] assisted living.  She has previously been admitted to [facility] for myxedema coma. Patient was admitted and started on IV Synthroid for her severe hypothyroidism.  Clinical Impression  PT/OT co-evaluation performed.  Per chart pt long term resident at [facility] called pt's facility to obtain PLOF but no staff (who knew pt's PLOF) available to talk with therapist and staff who answered therapists phone call said staff would call therapist back later when they had time (therapist never received phone call back).  Per care management note today pt used a rolling walker at baseline and is on 3 L chronic O2; also pt not getting any HH services prior to admission. Pt oriented to self only; often stating yes/no to questions and inconsistent with information during session; pt reported she was independent with w/c transfers and required assistance with ADL's d/t being legally blind.  Currently pt is mod assist x2 with bed mobility; sitting balance improved from mod assist to close SBA.  Attempted to have pt stand up to walker (x3 trials) from bed--pt would initiate briefly before stopping each time d/t c/o pain in B feet (pt then refused further attempts so pt assisted back to bed).  Pt would benefit from skilled PT to address noted impairments and functional limitations (see below for  any additional details).  Upon hospital discharge, pt would benefit from STR.    Follow Up Recommendations SNF    Equipment Recommendations  Other (comment) (TBD at next facility)    Recommendations for Other Services       Precautions / Restrictions Precautions Precautions: Fall Precaution Comments: Legally blind Restrictions Weight Bearing Restrictions: No      Mobility  Bed Mobility Overal bed mobility: Needs Assistance Bed Mobility: Rolling;Supine to Sit;Sit to Supine Rolling: Mod assist;+2 for physical assistance   Supine to sit: Mod assist;+2 for physical assistance;HOB elevated Sit to supine: Mod assist;+2 for physical assistance   General bed mobility comments: initiates B LE movement towards edge of bed but then brings LE's back to middle of bed; difficulty following multi-step commands; able to bridge mildly in bed to adjust sheets under pt  Transfers Overall transfer level: Needs assistance Equipment used: Rolling walker (2 wheeled) Transfers: Sit to/from Stand Sit to Stand: +2 physical assistance         General transfer comment: pt initiates movement to stand but then quickly stops d/t reporting pain in B feet (x3 trials; unable to clear bottom from bed any of the trials)  Ambulation/Gait             General Gait Details: unable to stand to attempt  Stairs            Wheelchair Mobility    Modified Rankin (Stroke Patients Only)       Balance Overall balance assessment: Needs assistance Sitting-balance support: No upper extremity supported;Feet supported Sitting balance-Leahy Scale: Fair Sitting balance - Comments: initial posterior lean requiring mod assist  for balance but improved to close SBA for static sitting edge of bed (with feet touching floor)                                     Pertinent Vitals/Pain Pain Assessment: 0-10  6/10 pain B feet (with [place] activities) and headache (nurse notified)    Home  Living Family/patient expects to be discharged to:: Assisted living               Home Equipment: Gilford Rile - 2 wheels Additional Comments: Pt is long term resident of [facility].     Prior Function Level of Independence: Needs assistance   Gait / Transfers Assistance Needed: Pt appearing to be an unreliable historian but reports chair and w/c transfers w/o AD (per care management note pt uses walker baseline)  ADL's / Homemaking Assistance Needed: Pt reports requiring assist for ADLs r/t legal blindness        Hand Dominance   Dominant Hand: Left    Extremity/Trunk Assessment   Upper Extremity Assessment Upper Extremity Assessment: Defer to OT evaluation RUE Deficits / Details: Per OT eval: "Severe tremors noted. Grip 3/5. AROM shoulder flexion ~90*, elbow flexion WFL " LUE Deficits / Details: Per OT eval: "Severe tremors noted. c/o L shoulder pain c mobility. Grip 3/5. AROM shoulder flexion ~80*, elbow flexion WFL"    Lower Extremity Assessment Lower Extremity Assessment: Generalized weakness    Cervical / Trunk Assessment Cervical / Trunk Assessment: Other exceptions (forward head/shoulders)  Communication   Communication: Other (comment) (Mostly states yes/no)  Cognition Arousal/Alertness: Awake/alert Behavior During Therapy: Flat affect Overall Cognitive Status: No family/caregiver present to determine baseline cognitive functioning                                 General Comments: Oriented to self only. States yes/no for questions but changes answers throughout session      General Comments General comments (skin integrity, edema, etc.): Difficulty obtaining O2 reading secondary to B UE tremors (pt appears in NAD)--NT notified    Exercises     Assessment/Plan    PT Assessment Patient needs continued PT services  PT Problem List Decreased strength;Decreased activity tolerance;Decreased balance;Decreased mobility;Decreased knowledge of  use of DME;Pain       PT Treatment Interventions DME instruction;Gait training;Functional mobility training;Therapeutic activities;Therapeutic exercise;Balance training;Patient/family education    PT Goals (Current goals can be found in the Care Plan section)  Acute Rehab PT Goals Patient Stated Goal: To eat breakfast  PT Goal Formulation: With patient Time For Goal Achievement: 11/28/19 Potential to Achieve Goals: Good    Frequency Min 2X/week   Barriers to discharge Decreased caregiver support      Co-evaluation PT/OT/SLP Co-Evaluation/Treatment: Yes Reason for Co-Treatment: Complexity of the patient's impairments (multi-system involvement);Necessary to address cognition/behavior during functional activity;To address functional/ADL transfers PT goals addressed during session: Mobility/safety with mobility;Balance;Proper use of DME OT goals addressed during session: ADL's and self-care;Proper use of Adaptive equipment and DME       AM-PAC PT "6 Clicks" Mobility  Outcome Measure Help needed turning from your back to your side while in a flat bed without using bedrails?: Total Help needed moving from lying on your back to sitting on the side of a flat bed without using bedrails?: Total Help needed moving to and from a bed  to a chair (including a wheelchair)?: Total Help needed standing up from a chair using your arms (e.g., wheelchair or bedside chair)?: Total Help needed to walk in hospital room?: Total Help needed climbing 3-5 steps with a railing? : Total 6 Click Score: 6    End of Session Equipment Utilized During Treatment: Gait belt;Oxygen (3 L O2 via nasal cannula) Activity Tolerance: Patient limited by pain Patient left: in bed;with call bell/phone within reach;with bed alarm set;with nursing/sitter in room;Other (comment) (NT present assisting pt with breakfast) Nurse Communication: Mobility status;Precautions;Other (comment) (pt's pain status) PT Visit Diagnosis:  Other abnormalities of gait and mobility (R26.89);Muscle weakness (generalized) (M62.81);Difficulty in walking, not elsewhere classified (R26.2);Pain Pain - Right/Left: Right (Left) Pain - part of body: Ankle and joints of foot    Time: 3202-3343 PT Time Calculation (min) (ACUTE ONLY): 29 min   Charges:   PT Evaluation $PT Eval Low Complexity: 1 Low PT Treatments $Therapeutic Activity: 8-22 mins        Leitha Bleak, PT 11/14/19, 4:12 PM

## 2019-11-15 NOTE — TOC Progression Note (Signed)
Transition of Care [facility]) - Progression Note    Patient Details  Name: Kelsey Hanna MRN: [id number] Date of Birth: 12-30-1950  [facility] Care [facility]) CM/SW [place], LCSW Phone Number: 11/15/2019, 3:50 PM  Clinical Narrative:    CSW met with patient at bedside to discuss discharge plan. CSW explained OT/PT recommendations are for Skilled nursing facility for rehab. CSW ask patient if that is an option she is willing to consider. Patient agreed to having her information sent out to SNF's in the area to see if there are any beds available, then CSW will come back and discuss options of bed offers/location. CSW ask patient if there are family members patient would like to give input? Patient stated no. CSW completed PASRR#, FL2 and sent out for possible bed offers.    Expected Discharge Plan: [place] Barriers to Discharge: Continued Medical Work up  Expected Discharge Plan and Services Expected Discharge Plan: [place] arrangements for the past 2 months: [place]                                       Social Determinants of Health (SDOH) Interventions    Readmission Risk Interventions No flowsheet data found.

## 2019-11-15 NOTE — Progress Notes (Signed)
Physical Therapy Treatment Patient Details Name: Kelsey Hanna MRN: [id number] DOB: [birth year] Today's Date: 11/15/2019    History of Present Illness Pt is a 69 year old Caucasian female long-term resident at [facility] assisted living facility, known history of hypothyroidism, legal blindness, depression, bipolar disorder, 3 L chronic home O2, and dementia who was brought to the emergency department with acute onset of generalized weakness. Also diminished p.o. intake over the last couple days and new onset left-sided upper extremity tremors.  The patient has a history of nonadherence to her Synthroid assisted living.  She has previously been admitted to [facility] for myxedema coma. Patient was admitted and started on IV Synthroid for her severe hypothyroidism.    PT Comments    Pt needed assist with most aspects of mobility, but per previous sessions she did much better and needed far less assist.  Pt was able to bridge nicely w/o assist, showed good effort with rolling, though she did need increasing assist with rolling, sitting from supine and finally heavy assist with return to supine after standing/mobility.  She needed consistent and extensive cuing due to some apparent confusion as well as her blindness.  Pt was able to take some small shuffling steps at EOB (unable to shift more than ~1" each time) and despite c/o pain in R ankle appeared to maintain [place] on R more confidently than L.  She was pleasant but slow to do most exercises, mobility and other requested acts.  Pt overall showing good improvement.   Follow Up Recommendations  SNF     Equipment Recommendations  Other (comment) (TBD at next venue of care)    Recommendations for Other Services       Precautions / Restrictions Precautions Precautions: Fall Precaution Comments: Legally blind Restrictions Weight Bearing Restrictions: No    Mobility  Bed Mobility Overal bed mobility: Needs Assistance Bed  Mobility: Rolling;Supine to Sit;Sit to Supine Rolling: Min assist   Supine to sit: Mod assist Sit to supine: Max assist   General bed mobility comments: Pt showed good ability to bridge and scoot toward EOB, needed assist to roll and get R LE to EOB  Transfers Overall transfer level: Needs assistance Equipment used: Rolling walker (2 wheeled) Transfers: Sit to/from Stand Sit to Stand: Min assist         General transfer comment: Pt was able to rise to standing from standard height bed w/ only light assist.  Heavy use of the walker to maintain standing but pt did well with getting to standing w/o assist  Ambulation/Gait             General Gait Details: Pt was able to take many small side shuffling steps at EOB, but c/o R ankle pain and though she did not need more than min assist to maintain standing she could not tolerate prolonged upright   Stairs             Wheelchair Mobility    Modified Rankin (Stroke Patients Only)       Balance Overall balance assessment: Needs assistance Sitting-balance support: No upper extremity supported;Feet supported Sitting balance-Leahy Scale: Good       Standing balance-Leahy Scale: Fair Standing balance comment: reliant on walker, no overt LOBs, consistent CGA (with rare min assist) but poor tolerance siting R ankle pain (though R LE appears to tolerate weight better than L during shuffling side steps)  Cognition Arousal/Alertness: Awake/alert Behavior During Therapy: Flat affect Overall Cognitive Status: Difficult to assess                                 General Comments: inconsistent ability answer questions appropriately, stay on task, assuming at least some baseline confusion      Exercises General Exercises - Lower Extremity Ankle Circles/Pumps: 10 reps;AROM Heel Slides: AROM;10 reps (with lightly resisted leg extensions) Hip ABduction/ADduction: 10  reps;AROM;Strengthening Straight Leg Raises: AROM;10 reps    General Comments        Pertinent Vitals/Pain Pain Assessment: Faces Faces Pain Scale: Hurts little more Pain Location: R ankle/foot    Home Living                      Prior Function            PT Goals (current goals can now be found in the care plan section) Progress towards PT goals: Progressing toward goals    Frequency    Min 2X/week      PT Plan Current plan remains appropriate    Co-evaluation              AM-PAC PT "6 Clicks" Mobility   Outcome Measure  Help needed turning from your back to your side while in a flat bed without using bedrails?: A Little Help needed moving from lying on your back to sitting on the side of a flat bed without using bedrails?: A Lot Help needed moving to and from a bed to a chair (including a wheelchair)?: A Lot Help needed standing up from a chair using your arms (e.g., wheelchair or bedside chair)?: A Little Help needed to walk in hospital room?: A Lot Help needed climbing 3-5 steps with a railing? : Total 6 Click Score: 13    End of Session Equipment Utilized During Treatment: Gait belt;Oxygen (2L) Activity Tolerance: Patient limited by pain Patient left: with call bell/phone within reach;with bed alarm set;with nursing/sitter in room Nurse Communication: Mobility status (needs assist with dinner) PT Visit Diagnosis: Other abnormalities of gait and mobility (R26.89);Muscle weakness (generalized) (M62.81);Difficulty in walking, not elsewhere classified (R26.2);Pain Pain - Right/Left: Right Pain - part of body: Ankle and joints of foot     Time: 6213-0865 PT Time Calculation (min) (ACUTE ONLY): 40 min  Charges:  $Therapeutic Exercise: 8-22 mins $Therapeutic Activity: 23-37 mins                     Kreg Shropshire, DPT 11/15/2019, 5:47 PM

## 2019-11-15 NOTE — Progress Notes (Signed)
PROGRESS NOTE    MAGGI HERSHKOWITZ  JQZ:[id number] DOB: 07-12-1950 DOA: 11/12/2019 PCP: [name], FNP    Brief Narrative:  69 year old Caucasian female long-term resident at [facility] assisted living facility, known history of hypothyroidism depression bipolar disorder and dementia who was brought to the emergency department with acute onset of generalized weakness.  He was level of responsiveness noted at her assisted living.  Also diminished p.o. intake over the last couple days and new onset left-sided upper extremity tremors.  The patient has a history of nonadherence to her Synthroid assisted living.  She has previously been admitted to [facility] for myxedema coma.  [facility] was contacted by ED provider on admission and declined transfer.  Patient was admitted and started on IV Synthroid for her severe hypothyroidism.  After initiation of IV Synthroid patient's mental status started to improve.  On my evaluation she states she feels better.  She speaks complete sentences.  Tremors persistent.  9/27: Patient was little bit more lethargic and confused this morning.  Does answer all questions appropriately but apparently is only alert and oriented x1.  Still tremulous.  Starting to wake up more.  Mental status approaching baseline today.  Today with the first day she told me she actually does have Parkinson's well the night the last few days.  No pain complaints this morning.   Assessment & Plan:   Active Problems:   AMS (altered mental status)   Severe hypothyroidism  Severe hypothyroidism Acute metabolic encephalopathy secondary to above, improved Medication nonadherence Patient has a known history of medication nonadherence with her Synthroid History of admission to [facility] for myxedema coma in the past IV Synthroid started in emergency room Seems to be slowly responding to treatment Plan: Can resume home regimen of Synthroid 150 mcg daily Patient got 3 doses of  IV Synthroid 200 mcg daily TSH improving Mental status picking up Anticipate 1 additional day of inpatient treatment monitoring and disposition planning within 24 hours  Parkinson's disease Patient appeared to have been confused previously.  I asked her on admission and subsequent days whether she had a diagnosis of Parkinson's.  I told her she was on Sinemet.  She repeatedly denied having Parkinson's however this morning, she in fact did have Parkinson's.  Unclear why the confusion.  Perhaps related to her untreated hypothyroidism. -Continue Sinemet  Chronic hypoxic respiratory failure Patient on baseline 3 L oxygen  Hyperlipidemia Statin  Gout Allopurinol  Bipolar disorder Depakote and Zyprexa  Dementia On [place]  Neuropathic pain Continue Neurontin as to not precipitate withdrawals   DVT prophylaxis: Lovenox Code Status: DNR Family Communication:  Disposition Plan: Status is: Inpatient  Remains inpatient appropriate because:Inpatient level of care appropriate due to severity of illness   Dispo: The patient is from: ALF              Anticipated d/c is to: SNF              Anticipated d/c date is: 1 day              Patient currently is not medically stable to d/c.   Mental status started to improve.  Patient approaching baseline today.  Patient remained stable or continues to improve over the next 24 hours anticipate she will be medically ready for discharge on 2019-11-16.  Consultants:   None  Procedures:   None  Antimicrobials:   None   Subjective: Seen and examined.  No pain complaints this morning.  Patient more lucid.  Objective: Vitals:   11/14/19 1244 11/14/19 1245 11/14/19 2014 11/15/19 0417  BP: (!) 214/156 (!) 142/101 122/62 (!) 118/58  Pulse: (!) 59 88 80 81  Resp: 16  (!) 22 20  Temp: 98.4 F (36.9 C)  97.6 F (36.4 C) 98.3 F (36.8 C)  TempSrc: Oral  Oral Oral  SpO2: (!) 75% 96% 100% 98%  Weight:      Height:         Intake/Output Summary (Last 24 hours) at 11/15/2019 1207 Last data filed at 11/14/2019 1600 Gross per 24 hour  Intake 500 ml  Output 550 ml  Net -50 ml   Filed Weights   11/12/19 1348 11/13/19 2229  Weight: 109 kg 98.8 kg    Examination:  General exam: Appears calm and comfortable.  Appears tremulous Respiratory system: Poor respiratory effort.  Lung sounds decreased at bases.  3 L  cardiovascular system: S1-S2.  Regular rate and rhythm.  No murmurs.  No pedal edema  gastrointestinal system: Obese, nontender, nondistended, positive bowel sounds Central nervous system: Alert, oriented to person and place.  Resting tremor noted  extremities: Diffusely decreased muscle strength bilaterally Skin: Pale, scattered excoriations Psychiatry: Judgment and insight normal.  Affect flattened    Data Reviewed: I have personally reviewed following labs and imaging studies  CBC: Recent Labs  Lab 11/12/19 1400 11/13/19 0457 11/14/19 0851  WBC 9.3 8.6 6.4  NEUTROABS  --   --  3.6  HGB 12.8 11.6* 10.6*  HCT 37.6 35.6* 32.5*  MCV 99.5 105.0* 102.2*  PLT 204 169 235   Basic Metabolic Panel: Recent Labs  Lab 11/12/19 1400 11/13/19 0457 11/14/19 0851  NA 142 143 145  K 3.2* 3.1* 3.5  CL 94* 98 106  CO2 31 34* 31  GLUCOSE 126* 105* 92  BUN 19 18 14   CREATININE 0.94 0.92 0.84  CALCIUM 9.7 9.2 8.8*   GFR: Estimated Creatinine Clearance: 69.8 mL/min (by C-G formula based on SCr of 0.84 mg/dL). Liver Function Tests: No results for input(s): AST, ALT, ALKPHOS, BILITOT, PROT, ALBUMIN in the last 168 hours. No results for input(s): LIPASE, AMYLASE in the last 168 hours. No results for input(s): AMMONIA in the last 168 hours. Coagulation Profile: No results for input(s): INR, PROTIME in the last 168 hours. Cardiac Enzymes: No results for input(s): CKTOTAL, CKMB, CKMBINDEX, TROPONINI in the last 168 hours. BNP (last 3 results) No results for input(s): PROBNP in the last 8760  hours. HbA1C: No results for input(s): HGBA1C in the last 72 hours. CBG: No results for input(s): GLUCAP in the last 168 hours. Lipid Profile: No results for input(s): CHOL, HDL, LDLCALC, TRIG, CHOLHDL, LDLDIRECT in the last 72 hours. Thyroid Function Tests: Recent Labs    11/13/19 0457 11/13/19 0457 11/14/19 0851  TSH 62.000*   < > 43.358*  FREET4 0.73  --   --    < > = values in this interval not displayed.   Anemia Panel: No results for input(s): VITAMINB12, FOLATE, FERRITIN, TIBC, IRON, RETICCTPCT in the last 72 hours. Sepsis Labs: No results for input(s): PROCALCITON, LATICACIDVEN in the last 168 hours.  Recent Results (from the past 240 hour(s))  Respiratory Panel by RT PCR (Flu A&B, Covid) - Nasopharyngeal Swab     Status: None   Collection Time: 11/13/19 12:15 AM   Specimen: Nasopharyngeal Swab  Result Value Ref Range Status   SARS Coronavirus 2 by RT PCR NEGATIVE NEGATIVE Final    Comment: (  NOTE) SARS-CoV-2 target nucleic acids are NOT DETECTED.  The SARS-CoV-2 RNA is generally detectable in upper respiratoy specimens during the acute phase of infection. The lowest concentration of SARS-CoV-2 viral copies this assay can detect is 131 copies/mL. A negative result does not preclude SARS-Cov-2 infection and should not be used as the sole basis for treatment or other patient management decisions. A negative result may occur with  improper specimen collection/handling, submission of specimen other than nasopharyngeal swab, presence of viral mutation(s) within the areas targeted by this assay, and inadequate number of viral copies (<131 copies/mL). A negative result must be combined with clinical observations, patient history, and epidemiological information. The expected result is Negative.  Fact Sheet for Patients:  PinkCheek.be  Fact Sheet for Healthcare Providers:  GravelBags.it  This test is no t yet  approved or cleared by the [place] FDA and  has been authorized for detection and/or diagnosis of SARS-CoV-2 by FDA under an Emergency Use Authorization (EUA). This EUA will remain  in effect (meaning this test can be used) for the duration of the COVID-19 declaration under Section 564(b)(1) of the Act, 21 U.S.C. section 360bbb-3(b)(1), unless the authorization is terminated or revoked sooner.     Influenza A by PCR NEGATIVE NEGATIVE Final   Influenza B by PCR NEGATIVE NEGATIVE Final    Comment: (NOTE) The Xpert Xpress SARS-CoV-2/FLU/RSV assay is intended as an aid in  the diagnosis of influenza from Nasopharyngeal swab specimens and  should not be used as a sole basis for treatment. Nasal washings and  aspirates are unacceptable for Xpert Xpress SARS-CoV-2/FLU/RSV  testing.  Fact Sheet for Patients: PinkCheek.be  Fact Sheet for Healthcare Providers: GravelBags.it  This test is not yet approved or cleared by the [place] FDA and  has been authorized for detection and/or diagnosis of SARS-CoV-2 by  FDA under an Emergency Use Authorization (EUA). This EUA will remain  in effect (meaning this test can be used) for the duration of the  Covid-19 declaration under Section 564(b)(1) of the Act, 21  U.S.C. section 360bbb-3(b)(1), unless the authorization is  terminated or revoked. Performed at [facility], [address], [place], [place] [address]          Radiology Studies: No results found.      Scheduled Meds: . allopurinol  100 mg Oral Daily  . atorvastatin  40 mg Oral QPM  . carbidopa-levodopa  2 tablet Oral TID  . divalproex  500 mg Oral BID  . enoxaparin (LOVENOX) injection  40 mg Subcutaneous Q24H  . levothyroxine  150 mcg Oral Q0600  . linaclotide  145 mcg Oral QAC breakfast  . memantine  5 mg Oral QHS  . OLANZapine zydis  20 mg Oral QHS  . polyethylene glycol  17 g Oral Daily  .  tamsulosin  0.4 mg Oral QPC supper   Continuous Infusions:    LOS: 2 days    Time spent: 15 minutes    Sidney Ace, MD [facility] Pager [phone]  If 7PM-7AM, please contact night-coverage 11/15/2019, 12:07 PM

## 2019-11-15 NOTE — NC FL2 (Signed)
[place] LEVEL OF CARE SCREENING TOOL     IDENTIFICATION  Patient Name: Kelsey Hanna Birthdate: [birth year] Sex: female Admission Date (Current Location): 11/12/2019  [place] and [place] Number:  Engineering geologist and Address:  [facility], [address] [place], [place] [address]      Provider Number:    Attending Physician Name and Address:  Sidney Ace, MD  Relative Name and Phone Number:  Patient requested CSW not speak with family. Discussed with patient at bedside    Current Level of Care: Hospital Recommended Level of Care: [place] Prior Approval Number:    Date Approved/Denied:   PASRR Number: [id number]  Discharge Plan: SNF    Current Diagnoses: Patient Active Problem List   Diagnosis Date Noted  . Severe hypothyroidism 11/13/2019  . AMS (altered mental status) 11/12/2019  . Memory loss 07/14/2017  . Weakness 06/11/2017  . Chronic pain 06/11/2017  . Sepsis ([place]) 05/03/2017  . MRSA bacteremia   . Brain aneurysm 03/13/2017  . Internal carotid aneurysm   . Slurred speech 03/12/2017  . Hypotension 03/12/2017  . Dysarthria   . Left sided numbness   . Acute left-sided muscle weakness 01/24/2017  . Stroke (cerebrum) ([place]) 01/24/2017  . Intracranial aneurysm 01/24/2017  . Encounter for medication review 02/07/2016  . Hypoxia, sleep related 09/07/2015  . Constipation 07/30/2015  . Dizziness 07/30/2015  . Chronic diastolic CHF (congestive heart failure) ([place]) 06/26/2015  . Bilateral lower extremity edema 06/26/2015  . Mitral regurgitation 05/17/2015  . Accidental drug overdose 05/17/2015  . Respiratory failure with hypoxia and hypercapnia ([place]) 05/09/2015  . OSA (obstructive sleep apnea) 05/09/2015  . Overdose 05/09/2015  . Chronic back pain 03/20/2015  . Right shoulder pain 11/14/2014  . Insomnia 11/14/2014  . Psychoses ([place])   . Bacteremia   . Pedal edema 09/12/2014  .  Bipolar disorder, current episode manic without psychotic features, severe ([place])   . Bipolar disorder ([place]) 09/05/2014  . Bipolar affective disorder, current episode manic with psychotic symptoms ([place]) 09/05/2014  . Bipolar affective disorder, manic ([place]) 09/03/2014  . Encounter for preadmission testing   . Head revolving around 07/27/2014  . BP (high blood pressure) 05/23/2014  . AF (paroxysmal atrial fibrillation) ([place]) 05/23/2014  . Type 2 diabetes mellitus ([place]) 04/27/2014  . Essential hypertension 04/27/2014  . Hypothyroidism 04/27/2014  . Tobacco abuse 04/27/2014  . Bipolar I disorder ([place]) 04/20/2014  . Acute encephalopathy 04/18/2014  . Acute respiratory failure with hypoxia ([place])   . Encounter for feeding tube placement   . Shortness of breath   . Gout 08/25/2013  . 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 HLD (hyperlipidemia) 08/30/2009    Orientation RESPIRATION BLADDER Height & Weight     Self, Place  O2 Incontinent Weight: 217 lb 13 oz (98.8 kg) Height:  5\' 3"  (160 cm)  BEHAVIORAL SYMPTOMS/MOOD NEUROLOGICAL BOWEL NUTRITION STATUS      Incontinent    AMBULATORY STATUS COMMUNICATION OF NEEDS Skin   Total Care Verbally Other (Comment) (Ecchymosis abdomin, arm)                       Personal Care Assistance Level of Assistance  Bathing, Feeding, Dressing, Total care Bathing Assistance: Maximum assistance Feeding assistance: Maximum assistance Dressing Assistance: Maximum assistance Total Care Assistance: Maximum assistance   Functional Limitations Info             SPECIAL CARE FACTORS FREQUENCY  PT (  By licensed PT), OT (By licensed OT)     PT Frequency: 5 x weekly OT Frequency: 5 x weekly            Contractures      Additional Factors Info  Code Status Code Status Info: DNR             Current Medications (11/15/2019):  This is the current hospital active medication list Current Facility-Administered Medications  Medication Dose Route  Frequency Provider Last Rate Last Admin  . acetaminophen (TYLENOL) tablet 650 mg  650 mg Oral Q6H PRN Mansy, Jan A, MD   650 mg at 11/13/19 2336   Or  . acetaminophen (TYLENOL) suppository 650 mg  650 mg Rectal Q6H PRN Mansy, Jan A, MD      . allopurinol (ZYLOPRIM) tablet 100 mg  100 mg Oral Daily Priscella Mann, Sudheer B, MD   100 mg at 11/15/19 0913  . atorvastatin (LIPITOR) tablet 40 mg  40 mg Oral QPM Ralene Muskrat B, MD   40 mg at 11/13/19 1846  . carbidopa-levodopa (SINEMET IR) 25-100 MG per tablet immediate release 2 tablet  2 tablet Oral TID Sidney Ace, MD   2 tablet at 11/15/19 0914  . divalproex (DEPAKOTE SPRINKLE) capsule 500 mg  500 mg Oral BID Ralene Muskrat B, MD   500 mg at 11/15/19 0913  . enoxaparin (LOVENOX) injection 40 mg  40 mg Subcutaneous Q24H Ralene Muskrat B, MD   40 mg at 11/14/19 2059  . levothyroxine (SYNTHROID) tablet 150 mcg  150 mcg Oral Q0600 Ralene Muskrat B, MD   150 mcg at 11/15/19 0458  . linaclotide (LINZESS) capsule 145 mcg  145 mcg Oral QAC breakfast Ralene Muskrat B, MD   145 mcg at 11/15/19 0914  . magnesium hydroxide (MILK OF MAGNESIA) suspension 30 mL  30 mL Oral Daily PRN Mansy, Jan A, MD      . memantine [facility]) tablet 5 mg  5 mg Oral QHS Ralene Muskrat B, MD   5 mg at 11/14/19 2059  . OLANZapine zydis (ZYPREXA) disintegrating tablet 20 mg  20 mg Oral QHS Ralene Muskrat B, MD   20 mg at 11/14/19 2059  . ondansetron (ZOFRAN) tablet 4 mg  4 mg Oral Q6H PRN Mansy, Jan A, MD       Or  . ondansetron [facility]) injection 4 mg  4 mg Intravenous Q6H PRN Mansy, Jan A, MD      . polyethylene glycol (MIRALAX / GLYCOLAX) packet 17 g  17 g Oral Daily Ralene Muskrat B, MD   17 g at 11/15/19 0914  . tamsulosin (FLOMAX) capsule 0.4 mg  0.4 mg Oral QPC supper Ralene Muskrat B, MD      . traZODone (DESYREL) tablet 25 mg  25 mg Oral QHS PRN Mansy, Arvella Merles, MD         Discharge Medications: Please see discharge summary for a list of  discharge medications.  Relevant Imaging Results:  Relevant Lab Results:   Additional Information SS#: [id number]  Meriel Flavors, LCSW

## 2019-11-16 NOTE — Care Management Important Message (Signed)
Important Message  Patient Details  Name: Kelsey Hanna MRN: [id number] Date of Birth: 1950/11/23   Medicare Important Message Given:  Yes     Dannette Barbara 11/16/2019, 11:14 AM

## 2019-11-16 NOTE — TOC Progression Note (Addendum)
Transition of Care [facility]) - Progression Note    Patient Details  Name: Kelsey Hanna MRN: [id number] Date of Birth: Oct 25, 1950  Transition of Care [facility]) CM/SW [place], LCSW Phone Number: 11/16/2019, 10:04 AM  Clinical Narrative: According to Advanced Directives which were scanned into patients chart in 2019, son is primary HCPOA and daughter is secondary. Left son a Advertising account executive. Will discuss SNF recommendation when he calls back.  11:39 am: Per RN, daughter requesting a call. CSW called and left a voicemail.  2:25 pm: CSW called daughter and discussed SNF recommendation. Only bed offer is from [facility]. Daughter is agreeable. Patient has been fully vaccinated. [facility] admissions coordinator said they should have a bed tomorrow. CSW working remote today. Attempted calling patient in room. No answer. Will try again this afternoon and if she doesn't answer CSW will stop by room in the morning.   4:07 pm: [facility] had a bed open up for today. Daughter is aware and agreeable.   Expected Discharge Plan: [place] Barriers to Discharge: Continued Medical Work up  Expected Discharge Plan and Services Expected Discharge Plan: [place] arrangements for the past 2 months: [place]                                       Social Determinants of Health (SDOH) Interventions    Readmission Risk Interventions No flowsheet data found.

## 2019-11-16 NOTE — TOC Transition Note (Signed)
Transition of Care [facility]) - CM/SW Discharge Note   Patient Details  Name: Kelsey Hanna MRN: [id number] Date of Birth: 07-29-1950  Transition of Care [facility]) CM/SW Contact:  Candie Chroman, LCSW Phone Number: 11/16/2019, 3:58 PM   Clinical Narrative: Patient has orders to discharge to [facility] today. RN will call report to [phone]. EMS transport has been set up and she is second on the list. Tried calling patient in the room twice per daughter request to provide update but no answer. Per RN assessment patient only oriented to self for the past few days. No further concerns. CSW signing off.    Final next level of care: Skilled Nursing Facility Barriers to Discharge: Barriers Resolved   Patient Goals and CMS Choice Patient states their goals for this hospitalization and ongoing recovery are:: Patient not fully oriented. CMS Medicare.gov Compare Post Acute Care list provided to:: Patient Represenative (must comment) (Daughter) Choice offered to / list presented to : Adult Children  Discharge Placement PASRR number recieved: 11/15/19            Patient chooses bed at: [facility]  Patient to be transferred to facility by: EMS Name of family member notified: Adrian Prows Patient and family notified of of transfer: 11/16/19  Discharge Plan and Services                                     Social Determinants of Health (SDOH) Interventions     Readmission Risk Interventions No flowsheet data found.

## 2019-11-16 NOTE — Plan of Care (Signed)

## 2019-11-16 NOTE — Progress Notes (Signed)
   11/16/19 1628  Vitals  BP (!) 158/72  MAP (mmHg) 96  BP Location Left Arm  BP Method Automatic  Patient Position (if appropriate) Lying  MEWS COLOR  MEWS Score Color Green  Oxygen Therapy  SpO2 96 %  O2 Device Room Air  MEWS Score  MEWS Temp 0  MEWS Systolic 0  MEWS Pulse 0  MEWS RR 0  MEWS LOC 0  MEWS Score 0  Patient VS in request of EMS. Manually taken at this time. Patient tremors inhibited dinamap reading.

## 2019-11-16 NOTE — Discharge Summary (Signed)
Physician Discharge Summary   Kelsey Hanna  female DOB: April 23, 1950  IRS:[id number]  PCP: [name], FNP  Admit date: 11/12/2019 Discharge date: 11/16/2019  Admitted From: assisted living Disposition:  SNF CODE STATUS: DNR   Hospital Course:  For full details, please see H&P, progress notes, consult notes and ancillary notes.  Briefly,  Kelsey Hanna is a 69 year old Caucasian female long-term resident at [place] assisted living facility, known history of hypothyroidism depression bipolar disorder and dementia who was brought to the emergency department with acute onset of generalized weakness and AMS.  Also diminished p.o. intake over the last couple days and new onset left-sided upper extremity tremors.  The patient has a history of nonadherence to her Synthroid.  She had previously been admitted to [facility] for myxedema coma.  [facility] was contacted by ED provider on admission and declined transfer.  Patient was admitted and started on IV Synthroid for her severe hypothyroidism.  Severe hypothyroidism Acute metabolic encephalopathy secondary to above, improved Medication nonadherence Patient has a known history of medication nonadherence with her Synthroid History of admission to [facility] for myxedema coma in the past.  On presentation, TSH 62 and free T4 0.56.  Patient got 3 doses of IV Synthroid 200 mcg daily.  After initiation of IV Synthroid, patient's mental status started to improve.  Pt was then resumed on her home regimen of Synthroid 150 mcg daily.  Pt will need outpatient followup to check TSH.  Parkinson's disease Tremors likely due to Parkinson's.  Pt continued on her home Sinemet.    Chronic hypoxic respiratory failure Stable on baseline 3 L oxygen  Hyperlipidemia Continued home Statin  Gout Continued home Allopurinol  Bipolar disorder Continued home Depakote and Zyprexa  Dementia Continued home Namenda  Neuropathic  pain Home Neurontin was held during hospitalization and resumed after discharge.   Discharge Diagnoses:  Active Problems:   AMS (altered mental status)   Severe hypothyroidism    Discharge Instructions:  Allergies as of 11/16/2019      Reactions   Lithium Other (See Comments)   "makes me feel spaced out" slurred speech   Naproxen       Medication List    STOP taking these medications   aspirin 81 MG EC tablet   Klor-Con 10 10 MEQ tablet Generic drug: potassium chloride     TAKE these medications   acetaminophen 500 MG tablet Commonly known as: TYLENOL Take 500 mg by mouth every 6 (six) hours as needed for mild pain.   allopurinol 100 MG tablet Commonly known as: ZYLOPRIM Take 100 mg by mouth daily.   atorvastatin 40 MG tablet Commonly known as: LIPITOR Take 1 tablet (40 mg total) by mouth every evening.   bumetanide 2 MG tablet Commonly known as: BUMEX Take 2-4 mg by mouth daily. Give two tablets (4 mg) by mouth daily at 9 am and one tablet (2 mg) daily at 2 pm   carbidopa-levodopa 25-100 MG tablet Commonly known as: SINEMET IR Take 2 tablets by mouth 3 (three) times daily.   cholecalciferol 25 MCG (1000 UNIT) tablet Commonly known as: VITAMIN D3 Take 2,000 Units by mouth daily.   divalproex 125 MG capsule Commonly known as: DEPAKOTE SPRINKLE Take 500 mg by mouth 2 (two) times daily.   Ferrex 150 Forte 150-1-25 MG-MG-MCG Caps Generic drug: Polysacchar Iron-FA-B12 Take by mouth.   gabapentin 100 MG capsule Commonly known as: NEURONTIN Take 100 mg by mouth 3 (three) times daily.  gabapentin 600 MG tablet Commonly known as: NEURONTIN Take 600 mg by mouth at bedtime.   levothyroxine 150 MCG tablet Commonly known as: SYNTHROID Take 150 mcg by mouth daily before breakfast.   Linzess 145 MCG Caps capsule Generic drug: linaclotide TAKE 1 CAPSULE BY MOUTH DAILY What changed:   how much to take  when to take this   memantine 5 MG  tablet Commonly known as: NAMENDA Take 5 mg by mouth at bedtime.   Narcan 4 MG/0.1ML Liqd nasal spray kit Generic drug: naloxone Place 0.4 mg into the nose once.   OLANZapine zydis 10 MG disintegrating tablet Commonly known as: ZYPREXA Take 20 mg by mouth at bedtime.   ondansetron 8 MG tablet Commonly known as: ZOFRAN Take 8 mg by mouth every 12 (twelve) hours as needed for nausea or vomiting.   polyethylene glycol 17 g packet Commonly known as: MIRALAX / GLYCOLAX Take 17 g by mouth daily.   vitamin B-12 500 MCG tablet Commonly known as: CYANOCOBALAMIN Take 500 mcg by mouth daily.        Contact information for follow-up providers    [name], [place]. Schedule an appointment as soon as possible for a visit in 1 week(s).   Specialty: Internal Medicine Contact information: [place] [address] [phone]            Contact information for after-discharge care    Destination    [facility] Preferred SNF .   Service: Skilled Nursing Contact information: [place] [phone]                  Allergies  Allergen Reactions  . Lithium Other (See Comments)    "makes me feel spaced out" slurred speech  . Naproxen      The results of significant diagnostics from this hospitalization (including imaging, microbiology, ancillary and laboratory) are listed below for reference.   Consultations:   Procedures/Studies: CT Head Wo Contrast  Result Date: 11/12/2019 CLINICAL DATA:  Mental status change, increasing weakness and confusion EXAM: CT HEAD WITHOUT CONTRAST TECHNIQUE: Contiguous axial images were obtained from the base of the skull through the vertex without intravenous contrast. COMPARISON:  CT 10/28/2019 FINDINGS: Brain: No evidence of acute infarction, hemorrhage, hydrocephalus, extra-axial collection, visible mass lesion or mass effect. Cavum septum pellucidum is  noted, benign incidental finding. Symmetric prominence of the ventricles, cisterns and sulci compatible with parenchymal volume loss. Patchy areas of white matter hypoattenuation are most compatible with moderate chronic microvascular angiopathy. Vascular: Atherosclerotic calcification of the carotid siphons and intradural vertebral arteries. No hyperdense vessel. Skull: Mild residual scalp thickening of the left frontoparietal scalp (2/20). No subjacent calvarial fracture or other acute or suspicious osseous abnormality. Sinuses/Orbits: Paranasal sinuses and mastoid air cells are predominantly clear. Orbital structures are unremarkable aside from prior lens extractions. Other: None IMPRESSION: 1. No acute intracranial findings. 2. Moderate parenchymal volume loss and chronic microvascular angiopathy. 3. Mild residual scalp thickening of the left frontoparietal scalp. No subjacent calvarial fracture. Electronically Signed   By: Lovena Le M.D.   On: 11/12/2019 20:00   CT Head Wo Contrast  Result Date: 10/28/2019 CLINICAL DATA:  Head trauma EXAM: CT HEAD WITHOUT CONTRAST CT CERVICAL SPINE WITHOUT CONTRAST TECHNIQUE: Multidetector CT imaging of the head and cervical spine was performed following the standard protocol without intravenous contrast. Multiplanar CT image reconstructions of the cervical spine were also generated. COMPARISON:  04/05/2019 FINDINGS: CT HEAD  FINDINGS Brain: No evidence of acute infarction, hemorrhage, hydrocephalus, extra-axial collection or mass lesion/mass effect. Prominent generalized cortical atrophy. Chronic small vessel ischemia in the periventricular white matter, relatively mild. Vascular: No hyperdense vessel or unexpected calcification. Skull: Left lateral and posterior scalp contusions are possible. No calvarial fracture. Sinuses/Orbits: No evidence of injury CT CERVICAL SPINE FINDINGS Alignment: No traumatic malalignment Skull base and vertebrae: No acute fracture Soft  tissues and spinal canal: No prevertebral fluid or swelling. No visible canal hematoma. Disc levels: Disc narrowing and ridging from C3-4 to C6-7 with foraminal impingement most notable at C5-6. Upper chest: No acute finding.  Apical emphysema. IMPRESSION: 1. No evidence of acute intracranial or cervical spine injury. 2. Advanced cerebral cortical atrophy. Electronically Signed   By: Monte Fantasia M.D.   On: 10/28/2019 10:55   CT Cervical Spine Wo Contrast  Result Date: 10/28/2019 CLINICAL DATA:  Head trauma EXAM: CT HEAD WITHOUT CONTRAST CT CERVICAL SPINE WITHOUT CONTRAST TECHNIQUE: Multidetector CT imaging of the head and cervical spine was performed following the standard protocol without intravenous contrast. Multiplanar CT image reconstructions of the cervical spine were also generated. COMPARISON:  04/05/2019 FINDINGS: CT HEAD FINDINGS Brain: No evidence of acute infarction, hemorrhage, hydrocephalus, extra-axial collection or mass lesion/mass effect. Prominent generalized cortical atrophy. Chronic small vessel ischemia in the periventricular white matter, relatively mild. Vascular: No hyperdense vessel or unexpected calcification. Skull: Left lateral and posterior scalp contusions are possible. No calvarial fracture. Sinuses/Orbits: No evidence of injury CT CERVICAL SPINE FINDINGS Alignment: No traumatic malalignment Skull base and vertebrae: No acute fracture Soft tissues and spinal canal: No prevertebral fluid or swelling. No visible canal hematoma. Disc levels: Disc narrowing and ridging from C3-4 to C6-7 with foraminal impingement most notable at C5-6. Upper chest: No acute finding.  Apical emphysema. IMPRESSION: 1. No evidence of acute intracranial or cervical spine injury. 2. Advanced cerebral cortical atrophy. Electronically Signed   By: Monte Fantasia M.D.   On: 10/28/2019 10:55   DG Chest Portable 1 View  Result Date: 11/12/2019 CLINICAL DATA:  Weakness. EXAM: PORTABLE CHEST 1 VIEW  COMPARISON:  October 28, 2019 FINDINGS: Cardiomediastinal silhouette is normal. Mediastinal contours appear intact. Calcific atherosclerotic disease of the aorta. Chronic elevation of left hemidiaphragm. There is no evidence of focal airspace consolidation, pleural effusion or pneumothorax. Osseous structures are without acute abnormality. Soft tissues are grossly normal. IMPRESSION: 1. No active disease. 2. Chronic elevation of left hemidiaphragm. Electronically Signed   By: Fidela Salisbury M.D.   On: 11/12/2019 19:25   DG Chest Portable 1 View  Result Date: 10/28/2019 CLINICAL DATA:  Shortness of breath EXAM: PORTABLE CHEST 1 VIEW COMPARISON:  December 21, 2017 FINDINGS: There is atelectatic change in the left base. The lungs elsewhere are clear. The heart size and pulmonary vascularity are normal. No adenopathy. No bone lesions. IMPRESSION: Left base atelectasis. Lungs elsewhere clear. Cardiac silhouette normal. No adenopathy. Electronically Signed   By: Lowella Grip III M.D.   On: 10/28/2019 10:34      Labs: BNP (last 3 results) Recent Labs    10/28/19 1033  BNP 14.4   Basic Metabolic Panel: Recent Labs  Lab 11/12/19 1400 11/13/19 0457 11/14/19 0851  NA 142 143 145  K 3.2* 3.1* 3.5  CL 94* 98 106  CO2 31 34* 31  GLUCOSE 126* 105* 92  BUN $Re'19 18 14  'cbq$ CREATININE 0.94 0.92 0.84  CALCIUM 9.7 9.2 8.8*   Liver Function Tests: No results for input(s): AST, ALT,  ALKPHOS, BILITOT, PROT, ALBUMIN in the last 168 hours. No results for input(s): LIPASE, AMYLASE in the last 168 hours. No results for input(s): AMMONIA in the last 168 hours. CBC: Recent Labs  Lab 11/12/19 1400 11/13/19 0457 11/14/19 0851  WBC 9.3 8.6 6.4  NEUTROABS  --   --  3.6  HGB 12.8 11.6* 10.6*  HCT 37.6 35.6* 32.5*  MCV 99.5 105.0* 102.2*  PLT 204 169 169   Cardiac Enzymes: No results for input(s): CKTOTAL, CKMB, CKMBINDEX, TROPONINI in the last 168 hours. BNP: Invalid input(s): POCBNP CBG: No  results for input(s): GLUCAP in the last 168 hours. D-Dimer No results for input(s): DDIMER in the last 72 hours. Hgb A1c No results for input(s): HGBA1C in the last 72 hours. Lipid Profile No results for input(s): CHOL, HDL, LDLCALC, TRIG, CHOLHDL, LDLDIRECT in the last 72 hours. Thyroid function studies Recent Labs    11/14/19 0851  TSH 43.358*   Anemia work up No results for input(s): VITAMINB12, FOLATE, FERRITIN, TIBC, IRON, RETICCTPCT in the last 72 hours. Urinalysis    Component Value Date/Time   COLORURINE AMBER (A) 11/13/2019 1040   APPEARANCEUR CLOUDY (A) 11/13/2019 1040   LABSPEC 1.025 11/13/2019 1040   PHURINE 6.0 11/13/2019 1040   GLUCOSEU NEGATIVE 11/13/2019 1040   HGBUR NEGATIVE 11/13/2019 1040   BILIRUBINUR NEGATIVE 11/13/2019 1040   KETONESUR 5 (A) 11/13/2019 1040   PROTEINUR 30 (A) 11/13/2019 1040   UROBILINOGEN 0.2 10/20/2014 0814   NITRITE NEGATIVE 11/13/2019 1040   LEUKOCYTESUR NEGATIVE 11/13/2019 1040   Sepsis Labs Invalid input(s): PROCALCITONIN,  WBC,  LACTICIDVEN Microbiology Recent Results (from the past 240 hour(s))  Respiratory Panel by RT PCR (Flu A&B, Covid) - Nasopharyngeal Swab     Status: None   Collection Time: 11/13/19 12:15 AM   Specimen: Nasopharyngeal Swab  Result Value Ref Range Status   SARS Coronavirus 2 by RT PCR NEGATIVE NEGATIVE Final    Comment: (NOTE) SARS-CoV-2 target nucleic acids are NOT DETECTED.  The SARS-CoV-2 RNA is generally detectable in upper respiratoy specimens during the acute phase of infection. The lowest concentration of SARS-CoV-2 viral copies this assay can detect is 131 copies/mL. A negative result does not preclude SARS-Cov-2 infection and should not be used as the sole basis for treatment or other patient management decisions. A negative result may occur with  improper specimen collection/handling, submission of specimen other than nasopharyngeal swab, presence of viral mutation(s) within the areas  targeted by this assay, and inadequate number of viral copies (<131 copies/mL). A negative result must be combined with clinical observations, patient history, and epidemiological information. The expected result is Negative.  Fact Sheet for Patients:  PinkCheek.be  Fact Sheet for Healthcare Providers:  GravelBags.it  This test is no t yet approved or cleared by the [place] FDA and  has been authorized for detection and/or diagnosis of SARS-CoV-2 by FDA under an Emergency Use Authorization (EUA). This EUA will remain  in effect (meaning this test can be used) for the duration of the COVID-19 declaration under Section 564(b)(1) of the Act, 21 U.S.C. section 360bbb-3(b)(1), unless the authorization is terminated or revoked sooner.     Influenza A by PCR NEGATIVE NEGATIVE Final   Influenza B by PCR NEGATIVE NEGATIVE Final    Comment: (NOTE) The Xpert Xpress SARS-CoV-2/FLU/RSV assay is intended as an aid in  the diagnosis of influenza from Nasopharyngeal swab specimens and  should not be used as a sole basis for treatment. Nasal washings and  aspirates are unacceptable for Xpert Xpress SARS-CoV-2/FLU/RSV  testing.  Fact Sheet for Patients: PinkCheek.be  Fact Sheet for Healthcare Providers: GravelBags.it  This test is not yet approved or cleared by the [place] FDA and  has been authorized for detection and/or diagnosis of SARS-CoV-2 by  FDA under an Emergency Use Authorization (EUA). This EUA will remain  in effect (meaning this test can be used) for the duration of the  Covid-19 declaration under Section 564(b)(1) of the Act, 21  U.S.C. section 360bbb-3(b)(1), unless the authorization is  terminated or revoked. Performed at [facility], [address] [place], [place] [address]      Total time spend on discharging this patient, including  the last patient exam, discussing the hospital stay, instructions for ongoing care as it relates to all pertinent caregivers, as well as preparing the medical discharge records, prescriptions, and/or referrals as applicable, is 35 minutes.    Enzo Bi, MD  [facility] 11/16/2019, 3:12 PM  If 7PM-7AM, please contact night-coverage

## 2019-11-16 NOTE — Care Plan (Signed)
Report called to Kim at [facility]. [phone]

## 2021-11-20 ENCOUNTER — Emergency Department (HOSPITAL_COMMUNITY): Payer: Medicare Other

## 2021-11-20 ENCOUNTER — Observation Stay (HOSPITAL_COMMUNITY): Payer: Medicare Other

## 2021-11-20 ENCOUNTER — Other Ambulatory Visit: Payer: Self-pay

## 2021-11-20 ENCOUNTER — Encounter (HOSPITAL_COMMUNITY): Payer: Self-pay

## 2021-11-20 ENCOUNTER — Inpatient Hospital Stay (HOSPITAL_COMMUNITY)
Admission: EM | Admit: 2021-11-20 | Discharge: 2021-11-28 | DRG: 871 | Disposition: A | Discharge status: Hospice | Payer: Medicare Other | Source: Skilled Nursing Facility | Attending: Internal Medicine | Admitting: Internal Medicine

## 2021-11-20 DIAGNOSIS — Z79890 Hormone replacement therapy (postmenopausal): Secondary | ICD-10-CM

## 2021-11-20 DIAGNOSIS — N3000 Acute cystitis without hematuria: Secondary | ICD-10-CM | POA: Diagnosis present

## 2021-11-20 DIAGNOSIS — Z66 Do not resuscitate: Secondary | ICD-10-CM | POA: Diagnosis present

## 2021-11-20 DIAGNOSIS — Z8542 Personal history of malignant neoplasm of other parts of uterus: Secondary | ICD-10-CM

## 2021-11-20 DIAGNOSIS — E1142 Type 2 diabetes mellitus with diabetic polyneuropathy: Secondary | ICD-10-CM | POA: Diagnosis present

## 2021-11-20 DIAGNOSIS — G9341 Metabolic encephalopathy: Secondary | ICD-10-CM | POA: Diagnosis not present

## 2021-11-20 DIAGNOSIS — H548 Legal blindness, as defined in USA: Secondary | ICD-10-CM | POA: Diagnosis present

## 2021-11-20 DIAGNOSIS — I5022 Chronic systolic (congestive) heart failure: Secondary | ICD-10-CM | POA: Diagnosis present

## 2021-11-20 DIAGNOSIS — F0283 Dementia in other diseases classified elsewhere, unspecified severity, with mood disturbance: Secondary | ICD-10-CM | POA: Diagnosis present

## 2021-11-20 DIAGNOSIS — E876 Hypokalemia: Secondary | ICD-10-CM | POA: Diagnosis present

## 2021-11-20 DIAGNOSIS — G4733 Obstructive sleep apnea (adult) (pediatric): Secondary | ICD-10-CM | POA: Diagnosis present

## 2021-11-20 DIAGNOSIS — Z9981 Dependence on supplemental oxygen: Secondary | ICD-10-CM

## 2021-11-20 DIAGNOSIS — F319 Bipolar disorder, unspecified: Secondary | ICD-10-CM | POA: Diagnosis present

## 2021-11-20 DIAGNOSIS — K219 Gastro-esophageal reflux disease without esophagitis: Secondary | ICD-10-CM | POA: Diagnosis present

## 2021-11-20 DIAGNOSIS — R4182 Altered mental status, unspecified: Secondary | ICD-10-CM

## 2021-11-20 DIAGNOSIS — G8929 Other chronic pain: Secondary | ICD-10-CM | POA: Diagnosis present

## 2021-11-20 DIAGNOSIS — M545 Low back pain, unspecified: Secondary | ICD-10-CM | POA: Diagnosis present

## 2021-11-20 DIAGNOSIS — G20B1 Parkinson's disease with dyskinesia, without mention of fluctuations: Secondary | ICD-10-CM | POA: Diagnosis present

## 2021-11-20 DIAGNOSIS — J9611 Chronic respiratory failure with hypoxia: Secondary | ICD-10-CM

## 2021-11-20 DIAGNOSIS — Z79899 Other long term (current) drug therapy: Secondary | ICD-10-CM

## 2021-11-20 DIAGNOSIS — E669 Obesity, unspecified: Secondary | ICD-10-CM | POA: Diagnosis present

## 2021-11-20 DIAGNOSIS — F0153 Vascular dementia, unspecified severity, with mood disturbance: Secondary | ICD-10-CM | POA: Diagnosis present

## 2021-11-20 DIAGNOSIS — Z886 Allergy status to analgesic agent status: Secondary | ICD-10-CM

## 2021-11-20 DIAGNOSIS — A4159 Other Gram-negative sepsis: Secondary | ICD-10-CM | POA: Diagnosis not present

## 2021-11-20 DIAGNOSIS — R627 Adult failure to thrive: Secondary | ICD-10-CM | POA: Diagnosis present

## 2021-11-20 DIAGNOSIS — G928 Other toxic encephalopathy: Secondary | ICD-10-CM

## 2021-11-20 DIAGNOSIS — Z95828 Presence of other vascular implants and grafts: Secondary | ICD-10-CM

## 2021-11-20 DIAGNOSIS — D6489 Other specified anemias: Secondary | ICD-10-CM | POA: Diagnosis present

## 2021-11-20 DIAGNOSIS — R4701 Aphasia: Secondary | ICD-10-CM | POA: Diagnosis present

## 2021-11-20 DIAGNOSIS — N390 Urinary tract infection, site not specified: Secondary | ICD-10-CM | POA: Diagnosis present

## 2021-11-20 DIAGNOSIS — E039 Hypothyroidism, unspecified: Secondary | ICD-10-CM | POA: Diagnosis present

## 2021-11-20 DIAGNOSIS — Z888 Allergy status to other drugs, medicaments and biological substances status: Secondary | ICD-10-CM

## 2021-11-20 DIAGNOSIS — Z6830 Body mass index (BMI) 30.0-30.9, adult: Secondary | ICD-10-CM

## 2021-11-20 DIAGNOSIS — F17210 Nicotine dependence, cigarettes, uncomplicated: Secondary | ICD-10-CM | POA: Diagnosis present

## 2021-11-20 DIAGNOSIS — Z515 Encounter for palliative care: Secondary | ICD-10-CM

## 2021-11-20 DIAGNOSIS — E785 Hyperlipidemia, unspecified: Secondary | ICD-10-CM | POA: Diagnosis present

## 2021-11-20 DIAGNOSIS — F419 Anxiety disorder, unspecified: Secondary | ICD-10-CM | POA: Diagnosis present

## 2021-11-20 DIAGNOSIS — A419 Sepsis, unspecified organism: Secondary | ICD-10-CM | POA: Diagnosis present

## 2021-11-20 DIAGNOSIS — Z1152 Encounter for screening for COVID-19: Secondary | ICD-10-CM

## 2021-11-20 DIAGNOSIS — Z1621 Resistance to vancomycin: Secondary | ICD-10-CM | POA: Diagnosis present

## 2021-11-20 DIAGNOSIS — I671 Cerebral aneurysm, nonruptured: Secondary | ICD-10-CM | POA: Diagnosis present

## 2021-11-20 LAB — PROTIME-INR
INR: 1.1 (ref 0.8–1.2)
Prothrombin Time: 13.6 seconds (ref 11.4–15.2)

## 2021-11-20 LAB — CBC
HCT: 32.3 % — ABNORMAL LOW (ref 36.0–46.0)
Hemoglobin: 10.3 g/dL — ABNORMAL LOW (ref 12.0–15.0)
MCH: 31.1 pg (ref 26.0–34.0)
MCHC: 31.9 g/dL (ref 30.0–36.0)
MCV: 97.6 fL (ref 80.0–100.0)
Platelets: 315 10*3/uL (ref 150–400)
RBC: 3.31 MIL/uL — ABNORMAL LOW (ref 3.87–5.11)
RDW: 20.4 % — ABNORMAL HIGH (ref 11.5–15.5)
WBC: 10.5 10*3/uL (ref 4.0–10.5)
nRBC: 0.4 % — ABNORMAL HIGH (ref 0.0–0.2)

## 2021-11-20 LAB — URINALYSIS, ROUTINE W REFLEX MICROSCOPIC
Glucose, UA: NEGATIVE mg/dL
Hgb urine dipstick: NEGATIVE
Ketones, ur: 20 mg/dL — AB
Nitrite: NEGATIVE
Protein, ur: 100 mg/dL — AB
Specific Gravity, Urine: >1.046 — ABNORMAL HIGH (ref 1.005–1.030)
WBC, UA: >50 WBC/hpf — ABNORMAL HIGH (ref 0–5)
pH: 7 (ref 5.0–8.0)

## 2021-11-20 LAB — DIFFERENTIAL
Abs Immature Granulocytes: 0.07 10*3/uL (ref 0.00–0.07)
Basophils Absolute: 0 10*3/uL (ref 0.0–0.1)
Basophils Relative: 0 %
Eosinophils Absolute: 0 10*3/uL (ref 0.0–0.5)
Eosinophils Relative: 0 %
Immature Granulocytes: 1 %
Lymphocytes Relative: 5 %
Lymphs Abs: 0.6 10*3/uL — ABNORMAL LOW (ref 0.7–4.0)
Monocytes Absolute: 0.5 10*3/uL (ref 0.1–1.0)
Monocytes Relative: 5 %
Neutro Abs: 9.3 10*3/uL — ABNORMAL HIGH (ref 1.7–7.7)
Neutrophils Relative %: 89 %

## 2021-11-20 LAB — COMPREHENSIVE METABOLIC PANEL
ALT: 6 U/L (ref 0–44)
AST: 35 U/L (ref 15–41)
Albumin: 2.7 g/dL — ABNORMAL LOW (ref 3.5–5.0)
Alkaline Phosphatase: 72 U/L (ref 38–126)
Anion gap: 14 (ref 5–15)
BUN: 16 mg/dL (ref 8–23)
CO2: 24 mmol/L (ref 22–32)
Calcium: 9.2 mg/dL (ref 8.9–10.3)
Chloride: 103 mmol/L (ref 98–111)
Creatinine, Ser: 0.68 mg/dL (ref 0.44–1.00)
GFR, Estimated: >60 mL/min (ref >60)
Glucose, Bld: 117 mg/dL — ABNORMAL HIGH (ref 70–99)
Potassium: 4.1 mmol/L (ref 3.5–5.1)
Sodium: 141 mmol/L (ref 135–145)
Total Bilirubin: 1.2 mg/dL (ref 0.3–1.2)
Total Protein: 5.8 g/dL — ABNORMAL LOW (ref 6.5–8.1)

## 2021-11-20 LAB — I-STAT CHEM 8, ED
BUN: 19 mg/dL (ref 8–23)
Calcium, Ion: 1.08 mmol/L — ABNORMAL LOW (ref 1.15–1.40)
Chloride: 103 mmol/L (ref 98–111)
Creatinine, Ser: 0.5 mg/dL (ref 0.44–1.00)
Glucose, Bld: 107 mg/dL — ABNORMAL HIGH (ref 70–99)
HCT: 31 % — ABNORMAL LOW (ref 36.0–46.0)
Hemoglobin: 10.5 g/dL — ABNORMAL LOW (ref 12.0–15.0)
Potassium: 4.1 mmol/L (ref 3.5–5.1)
Sodium: 138 mmol/L (ref 135–145)
TCO2: 26 mmol/L (ref 22–32)

## 2021-11-20 LAB — LACTIC ACID, PLASMA: Lactic Acid, Venous: 1.4 mmol/L (ref 0.5–1.9)

## 2021-11-20 LAB — RAPID URINE DRUG SCREEN, HOSP PERFORMED
Amphetamines: NOT DETECTED
Barbiturates: NOT DETECTED
Benzodiazepines: NOT DETECTED
Cocaine: NOT DETECTED
Opiates: NOT DETECTED
Tetrahydrocannabinol: NOT DETECTED

## 2021-11-20 LAB — APTT: aPTT: 27 seconds (ref 24–36)

## 2021-11-20 LAB — TSH: TSH: 11.535 u[IU]/mL — ABNORMAL HIGH (ref 0.350–4.500)

## 2021-11-20 LAB — ETHANOL: Alcohol, Ethyl (B): <10 mg/dL (ref <10)

## 2021-11-20 LAB — POC OCCULT BLOOD, ED: Fecal Occult Bld: NEGATIVE

## 2021-11-20 LAB — RESP PANEL BY RT-PCR (FLU A&B, COVID) ARPGX2
Influenza A by PCR: NEGATIVE
Influenza B by PCR: NEGATIVE
SARS Coronavirus 2 by RT PCR: NEGATIVE

## 2021-11-20 LAB — CBG MONITORING, ED: Glucose-Capillary: 100 mg/dL — ABNORMAL HIGH (ref 70–99)

## 2021-11-20 LAB — VALPROIC ACID LEVEL: Valproic Acid Lvl: 50 ug/mL (ref 50.0–100.0)

## 2021-11-20 MED ORDER — DIVALPROEX SODIUM 125 MG PO CSDR
500.0000 mg | DELAYED_RELEASE_CAPSULE | Freq: Two times a day (BID) | ORAL | Status: DC
  Administered 2021-11-21 (×2): 500 mg via ORAL
  Filled 2021-11-20 (×3): qty 4

## 2021-11-20 MED ORDER — LACTATED RINGERS IV SOLN: INTRAVENOUS | Status: DC

## 2021-11-20 MED ORDER — MEMANTINE HCL 10 MG PO TABS: 5.0000 mg | ORAL_TABLET | Freq: Every day | ORAL | Status: DC

## 2021-11-20 MED ORDER — LACTATED RINGERS IV BOLUS (SEPSIS)
1000.0000 mL | Freq: Once | INTRAVENOUS | Status: AC
  Administered 2021-11-20: 1000 mL via INTRAVENOUS

## 2021-11-20 MED ORDER — ATORVASTATIN CALCIUM 10 MG PO TABS: 20.0000 mg | ORAL_TABLET | Freq: Every evening | ORAL | Status: DC

## 2021-11-20 MED ORDER — ONDANSETRON HCL 4 MG/2ML IJ SOLN: 4.0000 mg | Freq: Four times a day (QID) | INTRAMUSCULAR | Status: DC | PRN

## 2021-11-20 MED ORDER — OLANZAPINE 5 MG PO TBDP: 20.0000 mg | ORAL_TABLET | Freq: Every day | ORAL | Status: DC

## 2021-11-20 MED ORDER — SODIUM CHLORIDE 0.9% FLUSH
3.0000 mL | Freq: Two times a day (BID) | INTRAVENOUS | Status: DC
  Administered 2021-11-21 – 2021-11-27 (×11): 3 mL via INTRAVENOUS

## 2021-11-20 MED ORDER — HYDROCODONE-ACETAMINOPHEN 5-325 MG PO TABS: 1.0000 | ORAL_TABLET | ORAL | Status: DC | PRN

## 2021-11-20 MED ORDER — ACETAMINOPHEN 325 MG PO TABS
650.0000 mg | ORAL_TABLET | Freq: Four times a day (QID) | ORAL | Status: DC | PRN
  Administered 2021-11-25: 650 mg via ORAL
  Filled 2021-11-20: qty 2

## 2021-11-20 MED ORDER — IOHEXOL 350 MG/ML SOLN
75.0000 mL | Freq: Once | INTRAVENOUS | Status: AC | PRN
  Administered 2021-11-20: 75 mL via INTRAVENOUS

## 2021-11-20 MED ORDER — CARBIDOPA-LEVODOPA 25-100 MG PO TABS: 2.0000 | ORAL_TABLET | Freq: Three times a day (TID) | ORAL | Status: DC

## 2021-11-20 MED ORDER — SODIUM CHLORIDE 0.9 % IV SOLN
500.0000 mg | Freq: Once | INTRAVENOUS | Status: AC
  Administered 2021-11-20: 500 mg via INTRAVENOUS
  Filled 2021-11-20: qty 5

## 2021-11-20 MED ORDER — SODIUM CHLORIDE 0.9 % IV SOLN
2.0000 g | INTRAVENOUS | Status: AC
  Administered 2021-11-20 – 2021-11-26 (×7): 2 g via INTRAVENOUS
  Filled 2021-11-20 (×7): qty 20

## 2021-11-20 MED ORDER — LEVOTHYROXINE SODIUM 112 MCG PO TABS: 112.0000 ug | ORAL_TABLET | Freq: Every day | ORAL | Status: DC

## 2021-11-20 MED ORDER — ONDANSETRON HCL 4 MG PO TABS: 4.0000 mg | ORAL_TABLET | Freq: Four times a day (QID) | ORAL | Status: DC | PRN

## 2021-11-20 MED ORDER — ACETAMINOPHEN 650 MG RE SUPP: 650.0000 mg | Freq: Four times a day (QID) | RECTAL | Status: DC | PRN

## 2021-11-20 NOTE — ED Notes (Signed)
Unable to obtain TSH or Valproic acid blood

## 2021-11-20 NOTE — Code Documentation (Signed)
Jacquelynn Friend is a 71 yr old female with history of AF, OSA, DM, Bipolar disorder and parkinson's residing in a nursing home. She is on no thinners.     Per EMS, staff said pt was in usual state of health today at 1130, after which time she became bilaterally weak, mute and had a left sided gaze. Code stroke activated by EMS. Pt met at bridge by Stroke Team. CBG, labs drawn, airway cleared by EDP.     Pt to CT with team. Pt still with left gaze, aphasia, and not following any commands. See documentation for timeline and NIHSS details.  CTNC done, and was negative for acute hemorrhage per Dr Rory Percy. CTA done. CTA neg for LVO.     Pt returned to room 31 where her workup will continue. She will need q 2 hr VS and NIHSS per Dr Rory Percy. No Thrombolysis as low suspicion for stroke and intracranial aneurysm. No thrombectomy as LVO negative. Bedside handoff with [facility].

## 2021-11-20 NOTE — ED Notes (Signed)
Pt to CT at this time.

## 2021-11-20 NOTE — Sepsis Progress Note (Signed)
Code sepsis protocol being monitored by eLink. 

## 2021-11-20 NOTE — ED Provider Notes (Addendum)
[place] EMERGENCY DEPARTMENT Provider Note   CSN: [id number] Arrival date & time: 11/20/21  1246  An emergency department physician performed an initial assessment on this suspected stroke patient at 1250.  History  Chief Complaint  Patient presents with   Code Stroke    Kelsey Hanna is a 71 y.o. female.  HPI   71 year old female with medical history significant for DM 2, bipolar disorder, hypothyroidism, HLD, depression, anxiety, OSA on CPAP, GERD, pseudoseizures who presents to the emergency department with altered mental status and as a code stroke.  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  She also has a history of Parkinson's disease for which she is on Sinemet.  She has chronic respiratory failure on hypoxia and is chronically on home oxygen 2 L O2.  EMS was called for sudden onset unresponsiveness with noted gaze deviation to the left.  She was nonverbal and nonparticipatory in the exam.  LVO screen was positive and she was brought in as a code stroke where her airway was cleared at the bridge and she was taken to the CT scanner for code stroke imaging.  She was found to be diffusely weak in all 4 extremities and was noncontributory for the HPI.  Last known well was reportedly 11:30 AM.  Home Medications Prior to Admission medications   Medication Sig Start Date End Date Taking? Authorizing Provider  acetaminophen (TYLENOL) 500 MG tablet Take 500 mg by mouth every 6 (six) hours as needed for mild pain.    [provider]  allopurinol (ZYLOPRIM) 100 MG tablet Take 100 mg by mouth daily.    [provider]  atorvastatin (LIPITOR) 40 MG tablet Take 1 tablet (40 mg total) by mouth every evening. 03/04/17   [name], FNP  bumetanide (BUMEX) 2 MG tablet Take 2-4 mg by mouth daily. Give two tablets (4 mg) by mouth daily at 9 am and one tablet (2 mg) daily at 2 pm    [provider]   carbidopa-levodopa (SINEMET IR) 25-100 MG tablet Take 2 tablets by mouth 3 (three) times daily.    [provider]  cholecalciferol (VITAMIN D3) 25 MCG (1000 UNIT) tablet Take 2,000 Units by mouth daily.    [provider]  divalproex (DEPAKOTE SPRINKLE) 125 MG capsule Take 500 mg by mouth 2 (two) times daily. 02/25/19   [provider]  gabapentin (NEURONTIN) 100 MG capsule Take 100 mg by mouth 3 (three) times daily.     [provider]  gabapentin (NEURONTIN) 600 MG tablet Take 600 mg by mouth at bedtime.     [provider]  levothyroxine (SYNTHROID) 150 MCG tablet Take 150 mcg by mouth daily before breakfast.    [provider]  LINZESS 145 MCG CAPS capsule TAKE 1 CAPSULE BY MOUTH DAILY Patient taking differently: Take 145 mcg by mouth daily before breakfast.  10/27/17   [name], FNP  memantine (NAMENDA) 5 MG tablet Take 5 mg by mouth at bedtime. 10/07/19   [provider]  NARCAN 4 MG/0.1ML LIQD nasal spray kit Place 0.4 mg into the nose once.  07/28/17   [provider]  OLANZapine zydis (ZYPREXA) 10 MG disintegrating tablet Take 20 mg by mouth at bedtime.  02/11/17   [provider]  ondansetron (ZOFRAN) 8 MG tablet Take 8 mg by mouth every 12 (twelve) hours as needed for nausea or vomiting.    [provider]  polyethylene  glycol (MIRALAX / GLYCOLAX) 17 g packet Take 17 g by mouth daily.    [provider]  Polysacchar Iron-FA-B12 ([place] 150 FORTE) 150-1-25 MG-MG-MCG CAPS Take by mouth.    [provider]  vitamin B-12 (CYANOCOBALAMIN) 500 MCG tablet Take 500 mcg by mouth daily.    [provider]      Allergies    Lithium and Naproxen    Review of Systems   Review of Systems  Unable to perform ROS: Mental status change    Physical Exam Updated Vital Signs BP 137/68   Pulse 88   Temp 99.3 F (37.4 C) (Oral)   Resp (!) 8   Ht $R'5\' 3"'ZS$  (1.6 m)   Wt 78  kg   SpO2 99%   BMI 30.47 kg/m  Physical Exam Vitals and nursing note reviewed.  Constitutional:      Comments: GCS 8 (4-3-1)  HENT:     Head: Normocephalic and atraumatic.  Eyes:     Conjunctiva/sclera: Conjunctivae normal.     Pupils: Pupils are equal, round, and reactive to light.  Cardiovascular:     Rate and Rhythm: Normal rate and regular rhythm.  Pulmonary:     Effort: Pulmonary effort is normal. No respiratory distress.  Abdominal:     General: There is no distension.     Tenderness: There is no guarding.  Musculoskeletal:        General: No deformity or signs of injury.     Cervical back: Neck supple.  Skin:    Findings: No lesion or rash.  Neurological:     Comments: GCS 8, eyes open spontaneously, will say yes no to questions with EMS, no motor response all 4 extremities, no clear cranial nerve deficit, diffusely weak all 4 extremities with no focal deficit     ED Results / Procedures / Treatments   Labs (all labs ordered are listed, but only abnormal results are displayed) Labs Reviewed  CBC - Abnormal; Notable for the following components:      Result Value   RBC 3.31 (*)    Hemoglobin 10.3 (*)    HCT 32.3 (*)    RDW 20.4 (*)    nRBC 0.4 (*)    All other components within normal limits  DIFFERENTIAL - Abnormal; Notable for the following components:   Neutro Abs 9.3 (*)    Lymphs Abs 0.6 (*)    All other components within normal limits  COMPREHENSIVE METABOLIC PANEL - Abnormal; Notable for the following components:   Glucose, Bld 117 (*)    Total Protein 5.8 (*)    Albumin 2.7 (*)    All other components within normal limits  URINALYSIS, ROUTINE W REFLEX MICROSCOPIC - Abnormal; Notable for the following components:   Color, Urine AMBER (*)    APPearance CLOUDY (*)    Specific Gravity, Urine >1.046 (*)    Bilirubin Urine SMALL (*)    Ketones, ur 20 (*)    Protein, ur 100 (*)    Leukocytes,Ua MODERATE (*)    WBC, UA >50 (*)    Bacteria, UA MANY  (*)    All other components within normal limits  I-STAT CHEM 8, ED - Abnormal; Notable for the following components:   Glucose, Bld 107 (*)    Calcium, Ion 1.08 (*)    Hemoglobin 10.5 (*)    HCT 31.0 (*)    All other components within normal limits  CBG MONITORING, ED - Abnormal; Notable for the following components:  Glucose-Capillary 100 (*)    All other components within normal limits  RESP PANEL BY RT-PCR (FLU A&B, COVID) ARPGX2  CULTURE, BLOOD (ROUTINE X 2)  CULTURE, BLOOD (ROUTINE X 2)  URINE CULTURE  ETHANOL  PROTIME-INR  APTT  RAPID URINE DRUG SCREEN, HOSP PERFORMED  LACTIC ACID, PLASMA  VALPROIC ACID LEVEL  TSH  POC OCCULT BLOOD, ED    EKG EKG Interpretation  Date/Time:  Wednesday November 20 2021 13:13:49 EDT Ventricular Rate:  89 PR Interval:  139 QRS Duration: 92 QT Interval:  374 QTC Calculation: 456 R Axis:   7 Text Interpretation: Sinus rhythm Probable left atrial enlargement Abnormal R-wave progression, early transition Confirmed by Regan Lemming ([id number]) on 11/20/2021 3:09:34 PM  Radiology DG Chest Portable 1 View  Result Date: 11/20/2021 CLINICAL DATA:  Reason for exam: AMS EXAM: PORTABLE CHEST - 1 VIEW COMPARISON:  11/12/2019 FINDINGS: Low lung volumes. Linear subsegmental atelectasis versus infiltrate in the right lower lung. Atelectasis or consolidation obscures the left diaphragmatic leaflet. Can not exclude small left effusion. Heart size normal. Fracture deformity of left humerus partially visualized. IMPRESSION: Low volumes with bibasilar atelectasis or infiltrates, left worse than right. Electronically Signed   By: Lucrezia Europe M.D.   On: 11/20/2021 16:48   EEG adult  Result Date: 11/20/2021 Lora Havens, MD     11/20/2021  4:26 PM Patient Name: SINDEE STUCKER MRN: [id number] Epilepsy Attending: Lora Havens Referring Physician/Provider: Amie Portland, MD Date: 11/20/2021 Duration: 34.39 mins Patient history: 71 year old woman brought in from  nursing facility for evaluation of sudden onset of unresponsiveness with EMS concerned with a leftward gaze. EEG to evaluate for seizure Level of alertness: Awake AEDs during EEG study: None Technical aspects: This EEG study was done with scalp electrodes positioned according to the 10-20 International system of electrode placement. Electrical activity was reviewed with band pass filter of 1-$RemoveBef'70Hz'UNDEIbUtnF$ , sensitivity of 7 uV/mm, display speed of 82mm/sec with a $Remo'60Hz'RqMya$  notched filter applied as appropriate. EEG data were recorded continuously and digitally stored.  Video monitoring was available and reviewed as appropriate. Description: No clear posterior dominant rhythm was seen. EEG showed continuous generalized 3 to 6 Hz theta-delta slowing. Patient was noted to have episodes of left upper extremity tremor like movements intermittently. Concomitant EEG before, during and after the study didn't show any eeg changes to suggest seizure. Hyperventilation and photic stimulation were not performed.   ABNORMALITY - Continuous slow, generalized IMPRESSION: This study is suggestive of moderate diffuse encephalopathy, nonspecific etiology. No seizures or epileptiform discharges were seen throughout the recording. Patient was noted to have episodes of left upper extremity tremor like movements intermittently without concomitant EEG change. These episodes were most likely NOT epileptic. Priyanka Barbra Sarks   CT ANGIO HEAD NECK W WO CM (CODE STROKE)  Result Date: 11/20/2021 CLINICAL DATA:  Stroke suspected EXAM: CT ANGIOGRAPHY HEAD AND NECK TECHNIQUE: Multidetector CT imaging of the head and neck was performed using the standard protocol during bolus administration of intravenous contrast. Multiplanar CT image reconstructions and MIPs were obtained to evaluate the vascular anatomy. Carotid stenosis measurements (when applicable) are obtained utilizing NASCET criteria, using the distal internal carotid diameter as the denominator.  RADIATION DOSE REDUCTION: This exam was performed according to the departmental dose-optimization program which includes automated exposure control, adjustment of the mA and/or kV according to patient size and/or use of iterative reconstruction technique. CONTRAST:  68mL OMNIPAQUE IOHEXOL 350 MG/ML SOLN COMPARISON:  None Available. FINDINGS: CT HEAD FINDINGS Brain:  No evidence of acute infarction, hemorrhage, hydrocephalus, extra-axial collection or mass lesion/mass effect. Vascular: See below Skull: Normal. Negative for fracture or focal lesion. Sinuses/Orbits: Bilateral lens replacements. Other: None. Review of the MIP images confirms the above findings CTA NECK FINDINGS Aortic arch: Standard branching. Imaged portion shows no evidence of aneurysm or dissection. No significant stenosis of the major arch vessel origins. Right carotid system: No evidence of dissection, stenosis (50% or greater), or occlusion. Left carotid system: No evidence of dissection, stenosis (50% or greater), or occlusion. Vertebral arteries: Codominant. No evidence of dissection, stenosis (50% or greater), or occlusion. Skeleton: Negative. Other neck: Negative. Upper chest: Small left pleural effusion. Mild centrilobular emphysema. Review of the MIP images confirms the above findings CTA HEAD FINDINGS Anterior circulation: Redemonstrated right cavernous ICA stent with mild narrowing of the distal end. 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 Redemonstrated mild narrowing of the proximal aspect of the M2 segment on the right (series 7, image 126 Posterior circulation: Right is hypoplastic. The right P2 is predominantly supplied by the right PCOM. This is unchanged compared to MRA from 2019. Venous sinuses: As permitted by contrast timing, patent. Anatomic variants: As above Review of the MIP images confirms the above findings IMPRESSION: 1.  No acute intracranial process. 2. No hemodynamically significant stenosis in the neck. 3. Redemonstrated right cavernous ICA stent with mild narrowing of the distal end. 4. 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 5. Visualized upper chest is notable for centrilobular emphysema and a small left pleural effusion. Emphysema (ICD10-J43.9). Electronically Signed   By: Marin Roberts M.D.   On: 11/20/2021 13:25   CT HEAD CODE STROKE WO CONTRAST  Result Date: 11/20/2021 CLINICAL DATA:  Code stroke. Neuro deficit, acute, stroke suspected. Left gaze. Generalized weakness. Patient is nonverbal. EXAM: CT HEAD WITHOUT CONTRAST TECHNIQUE: Contiguous axial images were obtained from the base of the skull through the vertex without intravenous contrast. RADIATION DOSE REDUCTION: This exam was performed according to the departmental dose-optimization program which includes automated exposure control, adjustment of the mA and/or kV according to patient size and/or use of iterative reconstruction technique. COMPARISON:  CT head without contrast 11/12/2019 FINDINGS: Brain: Moderate generalized atrophy and white matter disease is stable. No acute infarct, hemorrhage, or mass lesion is present. Basal ganglia are within normal limits. Insular ribbon is normal. No acute or focal cortical abnormality is present. Cavum septum pellucidum is present. The ventricles are proportionate to the degree of atrophy. No significant extraaxial fluid collection is present. The brainstem and cerebellum are within normal limits. Vascular: A stent is present within the cavernous right ICA. Hyperdense vessel is present. Skull: Calvarium is intact. No focal lytic or blastic lesions are present. No significant extracranial soft tissue lesion is present. Sinuses/Orbits: The paranasal sinuses and mastoid air cells are clear. Bilateral lens replacements are noted. Globes and  orbits are otherwise unremarkable. ASPECTS [facility] Stroke Program Early CT Score) - Ganglionic level infarction (caudate, lentiform nuclei, internal capsule, insula, M1-M3 cortex): 7/7 - Supraganglionic infarction (M4-M6 cortex): 3/3 Total score (0-10 with 10 being normal): 10/10 IMPRESSION: 1. No acute intracranial abnormality or significant interval change. 2. Stable atrophy and white matter disease. This likely reflects the sequela of chronic microvascular ischemia. 3. Stent within the cavernous right ICA. 4. Aspects is 10/10. The above was relayed via text pager to  Dr. Rory Percy on 11/20/2021 at 13:05 . Electronically Signed   By: San Morelle M.D.   On: 11/20/2021 13:05    Procedures Procedures    Medications Ordered in ED Medications  lactated ringers infusion ( Intravenous New Bag/Given 11/20/21 1702)  cefTRIAXone (ROCEPHIN) 2 g in sodium chloride 0.9 % 100 mL IVPB (0 g Intravenous Stopped 11/20/21 1540)  azithromycin (ZITHROMAX) 500 mg in sodium chloride 0.9 % 250 mL IVPB (has no administration in time range)  iohexol (OMNIPAQUE) 350 MG/ML injection 75 mL (75 mLs Intravenous Contrast Given 11/20/21 1307)  lactated ringers bolus 1,000 mL (0 mLs Intravenous Stopped 11/20/21 1601)    ED Course/ Medical Decision Making/ A&P Clinical Course as of 11/20/21 1928  Wed Nov 20, 2021  1423 Chalmers GuestMarland Kitchen): MODERATE [JL]  1423 WBC, UA(!): >50 [JL]  1423 Bacteria, UA(!): MANY [JL]  1527 AMS UTI.  [CC]    Clinical Course User Index [CC] Tretha Sciara, MD [JL] Regan Lemming, MD                           Medical Decision Making Amount and/or Complexity of Data Reviewed Labs: ordered. Decision-making details documented in ED Course. Radiology: ordered.  Risk Prescription drug management. Decision regarding hospitalization.   71 year old female with medical history significant for DM 2, bipolar disorder, hypothyroidism, HLD, depression, anxiety, OSA on CPAP, GERD, pseudoseizures who  presents to the emergency department with altered mental status and as a code stroke.  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  She also has a history of Parkinson's disease for which she is on Sinemet.  She has chronic respiratory failure on hypoxia and is chronically on home oxygen 2 L O2.  EMS was called for sudden onset unresponsiveness with noted gaze deviation to the left.  She was nonverbal and nonparticipatory in the exam.  LVO screen was positive and she was brought in as a code stroke where her airway was cleared at the bridge and she was taken to the CT scanner for code stroke imaging.  She was found to be diffusely weak in all 4 extremities and was noncontributory for the HPI.  Last known well was reportedly 11:30 AM.  On arrival, the patient was vitally stable, afebrile, temperature 99.5, tachypneic RR 24, saturating 92% on 2 L O2 via nasal cannula, subsequent improved to 99%, BP 122/70, pulse 94.  The patient was taken for code stroke imaging to include CT angio head and neck which resulted negative for LVO.  CT head revealed extensive atrophy and chronic white matter disease with no acute changes, a redemonstrated to millimeter by 3 mm inferior medially projecting saccular aneurysm was present along the anterior cavernous segment of the left ICA with redemonstrated right cavernous ICA stent with mild narrowing at the distal end.  Neurology notes that she has a history of PNES in the past.  They feel that she is improving in her mental status.  She had nonfocal neurologic accept findings and appeared encephalopathic.  Some concern for toxic metabolic encephalopathy by neurology, lower concern for acute CVA.  Neurology recommended MRI of the brain without contrast, routine EEG.  Remainder patient work-up significant for CBG 100 on arrival, CBC without a leukocytosis, anemia to 10.3, ethanol level normal, PT/INR negative, CMP without significant electrolyte  derangement, UDS negative, COVID-19 influenza PCR testing negative, urinalysis grossly positive for urinary tract infection with moderate leukocytes, greater than 50 WBCs and many bacteria  present, the patient was administered an IV fluid bolus and IV Rocephin.  Due to tachycardia, tachypnea, possible source of infection, code sepsis was initiated.  Chest x-ray pending.   Concern for acute encephalopathy in the setting of a urinary tract infection.  Neurology plans to follow the patient's MRI imaging inpatient.   Signout given to Dr. Oswald Hillock pending chest x-ray, plan for admission.  Chest x-ray resulted with low lung volumes with bibasilar atelectasis or infiltrates, left worse than right, IV azithromycin added to the patient's antibiotic regimen.      Final Clinical Impression(s) / ED Diagnoses Final diagnoses:  Altered mental status, unspecified altered mental status type  Acute cystitis without hematuria    Rx / DC Orders ED Discharge Orders     None         Regan Lemming, MD 11/20/21 Judith Blonder    Regan Lemming, MD 11/20/21 1931

## 2021-11-20 NOTE — Progress Notes (Signed)
STAT EEG complete - results pending. ? ?
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
## 2021-11-20 NOTE — Procedures (Signed)
Patient Name: Kelsey Hanna  MRN: [id number]  Epilepsy Attending: Lora Havens  Referring Physician/Provider: Amie Portland, MD  Date: 11/20/2021 Duration: 34.39 mins  Patient history: 71 year old woman brought in from nursing facility for evaluation of sudden onset of unresponsiveness with EMS concerned with a leftward gaze. EEG to evaluate for seizure  Level of alertness: Awake  AEDs during EEG study: None  Technical aspects: This EEG study was done with scalp electrodes positioned according to the 10-20 International system of electrode placement. Electrical activity was reviewed with band pass filter of 1-'70Hz'$ , sensitivity of 7 uV/mm, display speed of 73m/sec with a '60Hz'$  notched filter applied as appropriate. EEG data were recorded continuously and digitally stored.  Video monitoring was available and reviewed as appropriate.  Description: No clear posterior dominant rhythm was seen. EEG showed continuous generalized 3 to 6 Hz theta-delta slowing.  Patient was noted to have episodes of left upper extremity tremor like movements intermittently. Concomitant EEG before, during and after the study didn't show any eeg changes to suggest seizure.   Hyperventilation and photic stimulation were not performed.     ABNORMALITY - Continuous slow, generalized  IMPRESSION: This study is suggestive of moderate diffuse encephalopathy, nonspecific etiology. No seizures or epileptiform discharges were seen throughout the recording.  Patient was noted to have episodes of left upper extremity tremor like movements intermittently without concomitant EEG change. These episodes were most likely NOT epileptic.   Hasna Stefanik OBarbra Sarks
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
## 2021-11-20 NOTE — Progress Notes (Signed)
Responded to ED Code Stroke page to support staff and patient.  Patient gone to CT for Scan.  Chaplain will follow as needed.  Jaclynn Major, [place], [facility], [place] ger [phone]

## 2021-11-20 NOTE — ED Notes (Signed)
Large liquid BM pericare and linen change performed.  Patient only able to answer yes and no questions.

## 2021-11-20 NOTE — ED Provider Notes (Signed)
Care patient received from prior provider at 1630, please see their note for complete H&P and physical exam findings.  In short, this is a 71 year old female presenting for altered mental status.  Patient presented from a skilled nursing facility as a code stroke.  Patient was suffering from left gaze, aphasia and gross altered mental status.  Evaluated by neurology at bedside, thought to be less likely to be a stroke.  They think likely toxic metabolic encephalopathy.  She does have a history of Parkinson's and dementia.  Neurology recommended EEG which was completed and ultimately nondiagnostic. Medically, patient has a urinary tract infection and is a code sepsis.  Sepsis protocol initiated by prior provider currently undergoing therapy with improving mental status.  Patient hemodynamically stable in no acute distress.   Tretha Sciara, MD 11/20/21 [phone]

## 2021-11-21 DIAGNOSIS — I5022 Chronic systolic (congestive) heart failure: Secondary | ICD-10-CM | POA: Diagnosis present

## 2021-11-21 DIAGNOSIS — F0153 Vascular dementia, unspecified severity, with mood disturbance: Secondary | ICD-10-CM | POA: Diagnosis present

## 2021-11-21 DIAGNOSIS — G20B1 Parkinson's disease with dyskinesia, without mention of fluctuations: Secondary | ICD-10-CM | POA: Diagnosis present

## 2021-11-21 DIAGNOSIS — G9341 Metabolic encephalopathy: Secondary | ICD-10-CM

## 2021-11-21 DIAGNOSIS — E785 Hyperlipidemia, unspecified: Secondary | ICD-10-CM | POA: Diagnosis present

## 2021-11-21 DIAGNOSIS — D6489 Other specified anemias: Secondary | ICD-10-CM | POA: Diagnosis present

## 2021-11-21 DIAGNOSIS — A4159 Other Gram-negative sepsis: Secondary | ICD-10-CM | POA: Diagnosis present

## 2021-11-21 DIAGNOSIS — N3000 Acute cystitis without hematuria: Secondary | ICD-10-CM | POA: Diagnosis present

## 2021-11-21 DIAGNOSIS — R627 Adult failure to thrive: Secondary | ICD-10-CM | POA: Diagnosis present

## 2021-11-21 DIAGNOSIS — R4182 Altered mental status, unspecified: Secondary | ICD-10-CM | POA: Diagnosis not present

## 2021-11-21 DIAGNOSIS — Z1621 Resistance to vancomycin: Secondary | ICD-10-CM | POA: Diagnosis present

## 2021-11-21 DIAGNOSIS — E039 Hypothyroidism, unspecified: Secondary | ICD-10-CM | POA: Diagnosis present

## 2021-11-21 DIAGNOSIS — I671 Cerebral aneurysm, nonruptured: Secondary | ICD-10-CM | POA: Diagnosis present

## 2021-11-21 DIAGNOSIS — R4701 Aphasia: Secondary | ICD-10-CM | POA: Diagnosis present

## 2021-11-21 DIAGNOSIS — J9611 Chronic respiratory failure with hypoxia: Secondary | ICD-10-CM | POA: Diagnosis present

## 2021-11-21 DIAGNOSIS — F319 Bipolar disorder, unspecified: Secondary | ICD-10-CM | POA: Diagnosis present

## 2021-11-21 DIAGNOSIS — E669 Obesity, unspecified: Secondary | ICD-10-CM | POA: Diagnosis present

## 2021-11-21 DIAGNOSIS — Z515 Encounter for palliative care: Secondary | ICD-10-CM | POA: Diagnosis not present

## 2021-11-21 DIAGNOSIS — E1142 Type 2 diabetes mellitus with diabetic polyneuropathy: Secondary | ICD-10-CM | POA: Diagnosis present

## 2021-11-21 DIAGNOSIS — F0283 Dementia in other diseases classified elsewhere, unspecified severity, with mood disturbance: Secondary | ICD-10-CM | POA: Diagnosis present

## 2021-11-21 DIAGNOSIS — E876 Hypokalemia: Secondary | ICD-10-CM | POA: Diagnosis present

## 2021-11-21 DIAGNOSIS — Z1152 Encounter for screening for COVID-19: Secondary | ICD-10-CM | POA: Diagnosis not present

## 2021-11-21 DIAGNOSIS — Z66 Do not resuscitate: Secondary | ICD-10-CM | POA: Diagnosis present

## 2021-11-21 DIAGNOSIS — Z95828 Presence of other vascular implants and grafts: Secondary | ICD-10-CM | POA: Diagnosis not present

## 2021-11-21 LAB — BASIC METABOLIC PANEL
Anion gap: 13 (ref 5–15)
BUN: 17 mg/dL (ref 8–23)
CO2: 26 mmol/L (ref 22–32)
Calcium: 9 mg/dL (ref 8.9–10.3)
Chloride: 105 mmol/L (ref 98–111)
Creatinine, Ser: 0.34 mg/dL — ABNORMAL LOW (ref 0.44–1.00)
GFR, Estimated: >60 mL/min (ref >60)
Glucose, Bld: 77 mg/dL (ref 70–99)
Potassium: 3.8 mmol/L (ref 3.5–5.1)
Sodium: 144 mmol/L (ref 135–145)

## 2021-11-21 LAB — CBC
HCT: 29 % — ABNORMAL LOW (ref 36.0–46.0)
Hemoglobin: 9.2 g/dL — ABNORMAL LOW (ref 12.0–15.0)
MCH: 31.4 pg (ref 26.0–34.0)
MCHC: 31.7 g/dL (ref 30.0–36.0)
MCV: 99 fL (ref 80.0–100.0)
Platelets: 273 10*3/uL (ref 150–400)
RBC: 2.93 MIL/uL — ABNORMAL LOW (ref 3.87–5.11)
RDW: 20.9 % — ABNORMAL HIGH (ref 11.5–15.5)
WBC: 6.6 10*3/uL (ref 4.0–10.5)
nRBC: 0.5 % — ABNORMAL HIGH (ref 0.0–0.2)

## 2021-11-21 LAB — T4, FREE: Free T4: 1.18 ng/dL — ABNORMAL HIGH (ref 0.61–1.12)

## 2021-11-21 MED ORDER — SERTRALINE HCL 50 MG PO TABS
50.0000 mg | ORAL_TABLET | Freq: Every day | ORAL | Status: DC
  Administered 2021-11-21 – 2021-11-27 (×6): 50 mg via ORAL
  Filled 2021-11-21 (×8): qty 1

## 2021-11-21 MED ORDER — LEVOTHYROXINE SODIUM 100 MCG PO TABS
100.0000 ug | ORAL_TABLET | Freq: Every day | ORAL | Status: DC
  Administered 2021-11-22: 100 ug via ORAL
  Filled 2021-11-21: qty 1

## 2021-11-21 MED ORDER — PANTOPRAZOLE SODIUM 20 MG PO TBEC
20.0000 mg | DELAYED_RELEASE_TABLET | Freq: Every day | ORAL | Status: DC
  Administered 2021-11-21: 20 mg via ORAL
  Filled 2021-11-21 (×2): qty 1

## 2021-11-21 MED ORDER — MIRTAZAPINE 15 MG PO TABS
7.5000 mg | ORAL_TABLET | Freq: Every day | ORAL | Status: DC
  Administered 2021-11-21: 7.5 mg via ORAL
  Filled 2021-11-21: qty 1

## 2021-11-21 MED ORDER — OLANZAPINE 5 MG PO TABS
20.0000 mg | ORAL_TABLET | Freq: Every day | ORAL | Status: DC
  Administered 2021-11-21: 20 mg via ORAL
  Filled 2021-11-21: qty 4

## 2021-11-21 MED ORDER — CARBIDOPA-LEVODOPA ER 50-200 MG PO TBCR
1.0000 | EXTENDED_RELEASE_TABLET | Freq: Three times a day (TID) | ORAL | Status: DC
  Administered 2021-11-21 – 2021-11-28 (×19): 1 via ORAL
  Filled 2021-11-21 (×25): qty 1

## 2021-11-21 MED ORDER — ENTACAPONE 200 MG PO TABS
200.0000 mg | ORAL_TABLET | Freq: Three times a day (TID) | ORAL | Status: DC
  Administered 2021-11-21 – 2021-11-28 (×19): 200 mg via ORAL
  Filled 2021-11-21 (×24): qty 1

## 2021-11-21 MED ORDER — ATORVASTATIN CALCIUM 10 MG PO TABS
20.0000 mg | ORAL_TABLET | Freq: Every day | ORAL | Status: DC
  Administered 2021-11-21 – 2021-11-28 (×7): 20 mg via ORAL
  Filled 2021-11-21 (×8): qty 2

## 2021-11-21 MED ORDER — ENTACAPONE 200 MG PO TABS
200.0000 mg | ORAL_TABLET | Freq: Three times a day (TID) | ORAL | Status: DC
  Administered 2021-11-21: 200 mg via ORAL
  Filled 2021-11-21 (×3): qty 1

## 2021-11-21 MED ORDER — CARBIDOPA-LEVODOPA-ENTACAPONE 50-200-200 MG PO TABS: 1.0000 | ORAL_TABLET | Freq: Three times a day (TID) | ORAL | Status: DC

## 2021-11-21 MED ORDER — CARBIDOPA-LEVODOPA ER 50-200 MG PO TBCR
1.0000 | EXTENDED_RELEASE_TABLET | Freq: Three times a day (TID) | ORAL | Status: DC
  Administered 2021-11-21: 1 via ORAL
  Filled 2021-11-21 (×3): qty 1

## 2021-11-21 NOTE — Progress Notes (Signed)
Patient arrives to the hospital from [facility] for rehab. CSW will continue to follow for medical readiness.   Gilmore Laroche, MSW, [facility]

## 2021-11-21 NOTE — Progress Notes (Signed)
Following up on consult recs. MRI and EEG negative for acute process. Likely toxic metabolic encephalopathy from UTI in the setting of pre-existing underlying neurodegenerative process  No further inpatient neurological work up at this time. Please call with questions PRN.  -- Amie Portland, MD Neurologist [facility] Pager: [phone]
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
## 2021-11-21 NOTE — Progress Notes (Addendum)
PROGRESS NOTE    Kelsey Hanna  ZOX:[id number] DOB: 23-Nov-1950 DOA: 11/20/2021 PCP: Pcp, No     Brief Narrative:  Kelsey Hanna is a 71 y.o. female with medical history significant of vascular dementia, hypothyroidism, depression, bipolar disorder, dyslipidemia, chronic hypoxemic respiratory failure on 2 L oxygen, Parkinson's disease who was sent to the emergency department from her skilled nursing facility on 10/4.  Per report, patient was in a nursing facility due to being unresponsive.  EMS noted her to be gazing to the left, was nonverbal and non participatory.  She was sent to the emergency department as code stroke.    Neurology evaluated patient in the emergency department.  Patient underwent CT head, CTA head and neck which were unremarkable.  She also underwent stat EEG which was negative for epileptic seizure.  Neurology thought that her presentation was more likely to be toxic metabolic encephalopathy.  UA was grossly positive and patient started on antibiotics.  New events last 24 hours / Subjective: Patient seen with daughter at bedside.  Patient is much more interactive this morning, although not back to baseline.  She continues to repeat "smy open" over and over again.  She was able to smile at certain questions, responds yes appropriately to other questions.  She was able to follow simple commands to wiggle her toes and squeeze my hands.   Assessment & Plan:  Principal Problem:   Acute metabolic encephalopathy Active Problems:   Hypothyroidism   Bipolar disorder ([place])   HLD (hyperlipidemia)   Legal blindness [place]   Sepsis secondary to UTI ([place])   Chronic respiratory failure with hypoxia ([facility])   Acute metabolic encephalopathy -Could be secondary to UTI -CT head: No acute intracranial abnormality or significant interval change. -CTA head and neck: No acute intracranial process. No hemodynamically significant stenosis in the neck. -EEG: This study is suggestive of  moderate diffuse encephalopathy, nonspecific etiology. No seizures or epileptiform discharges were seen throughout the recording. Patient was noted to have episodes of left upper extremity tremor like movements intermittently without concomitant EEG change. These episodes were most likely NOT epileptic.  -UDS negative -MRI brain: No acute intracranial abnormality -Hold gabapentin -Appreciate neurology -Improved but not to baseline    Sepsis due to proteus mirabilis UTI -Sepsis present on admission with heart rate > 90, respiratory rate > 20 -UA positive for moderate leukocytes, many bacteria, >50 WBC -Blood culture pending -Urine culture > 100,000 proteus mirabilis  -Rocephin -IV fluid   Chronic hypoxic respiratory failure -Uses 2 L oxygen at baseline   Parkinson's -Carbidopa-Levodopa-Entacapone    Hypothyroidism -Synthroid --> lowered dose, free T4 marginally elevated, TSH elevated. Needs repeat TSH in 4-6 weeks    Vascular dementia -Namenda   Hyperlipidemia -Lipitor   Bipolar disorder -Depakote, Zyprexa   Question GI bleed -FOBT positive at facility and they set up outpatient GI referral -Hgb stable -FOBT --> negative here   DVT prophylaxis:  SCDs Start: 11/20/21 2235  Code Status: DNR, confirmed with daughter Family Communication: Daughter at bedside Disposition Plan:  Status is: Inpatient Remains inpatient appropriate because: Remains on IV antibiotics   Antimicrobials:  Anti-infectives (From admission, onward)    Start     Dose/Rate Route Frequency Ordered Stop   11/20/21 1930  azithromycin (ZITHROMAX) 500 mg in sodium chloride 0.9 % 250 mL IVPB        500 mg 250 mL/hr over 60 Minutes Intravenous  Once 11/20/21 1926 11/20/21 2239   11/20/21 1430  cefTRIAXone (ROCEPHIN)  2 g in sodium chloride 0.9 % 100 mL IVPB        2 g 200 mL/hr over 30 Minutes Intravenous Every 24 hours 11/20/21 1423 11/27/21 1429        Objective: Vitals:   11/21/21 1130  11/21/21 1145 11/21/21 1200 11/21/21 1251  BP: (!) 123/97 118/67 135/85 131/73  Pulse: 86 87 97 88  Resp: '17 20 19 16  '$ Temp:    97.7 F (36.5 C)  TempSrc:    Oral  SpO2: 100% 99% 99% 100%  Weight:      Height:        Intake/Output Summary (Last 24 hours) at 11/21/2021 1356 Last data filed at 11/20/2021 1601 Gross per 24 hour  Intake 1100 ml  Output --  Net 1100 ml   Filed Weights   11/20/21 1200 11/20/21 1305  Weight: 78.4 kg 78 kg    Examination:  General exam: Appears calm and comfortable  Respiratory system: Clear to auscultation. Respiratory effort normal. No respiratory distress.  Cardiovascular system: S1 & S2 heard, RRR. No murmurs. No pedal edema. Gastrointestinal system: Abdomen is nondistended, soft and nontender.  Central nervous system: Alert, answers mostly yes/no, repeats "smy open". Follows some commands to squeeze hands and wiggle toes  Extremities: Symmetric in appearance  Skin: No rashes, lesions or ulcers on exposed skin   Data Reviewed: I have personally reviewed following labs and imaging studies  CBC: Recent Labs  Lab 11/20/21 1255 11/20/21 1259 11/21/21 0358  WBC 10.5  --  6.6  NEUTROABS 9.3*  --   --   HGB 10.3* 10.5* 9.2*  HCT 32.3* 31.0* 29.0*  MCV 97.6  --  99.0  PLT 315  --  254   Basic Metabolic Panel: Recent Labs  Lab 11/20/21 1255 11/20/21 1259 11/21/21 0358  NA 141 138 144  K 4.1 4.1 3.8  CL 103 103 105  CO2 24  --  26  GLUCOSE 117* 107* 77  BUN '16 19 17  '$ CREATININE 0.68 0.50 0.34*  CALCIUM 9.2  --  9.0   GFR: Estimated Creatinine Clearance: 62.8 mL/min (A) (by C-G formula based on SCr of 0.34 mg/dL (L)). Liver Function Tests: Recent Labs  Lab 11/20/21 1255  AST 35  ALT 6  ALKPHOS 72  BILITOT 1.2  PROT 5.8*  ALBUMIN 2.7*   No results for input(s): "LIPASE", "AMYLASE" in the last 168 hours. No results for input(s): "AMMONIA" in the last 168 hours. Coagulation Profile: Recent Labs  Lab 11/20/21 1255  INR 1.1    Cardiac Enzymes: No results for input(s): "CKTOTAL", "CKMB", "CKMBINDEX", "TROPONINI" in the last 168 hours. BNP (last 3 results) No results for input(s): "PROBNP" in the last 8760 hours. HbA1C: No results for input(s): "HGBA1C" in the last 72 hours. CBG: Recent Labs  Lab 11/20/21 1248  GLUCAP 100*   Lipid Profile: No results for input(s): "CHOL", "HDL", "LDLCALC", "TRIG", "CHOLHDL", "LDLDIRECT" in the last 72 hours. Thyroid Function Tests: Recent Labs    11/20/21 1828 11/21/21 0358  TSH 11.535*  --   FREET4  --  1.18*   Anemia Panel: No results for input(s): "VITAMINB12", "FOLATE", "FERRITIN", "TIBC", "IRON", "RETICCTPCT" in the last 72 hours. Sepsis Labs: Recent Labs  Lab 11/20/21 1316  LATICACIDVEN 1.4    Recent Results (from the past 240 hour(s))  Blood culture (routine x 2)     Status: None (Preliminary result)   Collection Time: 11/20/21  1:16 PM   Specimen: BLOOD LEFT FOREARM  Result Value Ref Range Status   Specimen Description BLOOD LEFT FOREARM  Final   Special Requests   Final    BOTTLES DRAWN AEROBIC AND ANAEROBIC Blood Culture adequate volume   Culture   Final    NO GROWTH < 24 HOURS Performed at [facility], [address], [place], [place] [address]    Report Status PENDING  Incomplete  Urine Culture     Status: Abnormal (Preliminary result)   Collection Time: 11/20/21  1:16 PM   Specimen: In/Out Cath Urine  Result Value Ref Range Status   Specimen Description IN/OUT CATH URINE  Final   Special Requests   Final    NONE Performed at [facility], [place] [address], [place], [place] [address]    Culture >=100,000 COLONIES/mL PROTEUS MIRABILIS (A)  Final   Report Status PENDING  Incomplete  Blood culture (routine x 2)     Status: None (Preliminary result)   Collection Time: 11/20/21  1:21 PM   Specimen: BLOOD RIGHT FOREARM  Result Value Ref Range Status   Specimen Description BLOOD RIGHT FOREARM  Final   Special Requests   Final     BOTTLES DRAWN AEROBIC AND ANAEROBIC Blood Culture adequate volume   Culture   Final    NO GROWTH < 24 HOURS Performed at [facility], [place] [address] [place], [place] [address]    Report Status PENDING  Incomplete  Resp Panel by RT-PCR (Flu A&B, Covid) Anterior Nasal Swab     Status: None   Collection Time: 11/20/21  3:22 PM   Specimen: Anterior Nasal Swab  Result Value Ref Range Status   SARS Coronavirus 2 by RT PCR NEGATIVE NEGATIVE Final    Comment: (NOTE) SARS-CoV-2 target nucleic acids are NOT DETECTED.  The SARS-CoV-2 RNA is generally detectable in upper respiratory specimens during the acute phase of infection. The lowest concentration of SARS-CoV-2 viral copies this assay can detect is 138 copies/mL. A negative result does not preclude SARS-Cov-2 infection and should not be used as the sole basis for treatment or other patient management decisions. A negative result may occur with  improper specimen collection/handling, submission of specimen other than nasopharyngeal swab, presence of viral mutation(s) within the areas targeted by this assay, and inadequate number of viral copies(<138 copies/mL). A negative result must be combined with clinical observations, patient history, and epidemiological information. The expected result is Negative.  Fact Sheet for Patients:  EntrepreneurPulse.com.au  Fact Sheet for Healthcare Providers:  IncredibleEmployment.be  This test is no t yet approved or cleared by the [place] FDA and  has been authorized for detection and/or diagnosis of SARS-CoV-2 by FDA under an Emergency Use Authorization (EUA). This EUA will remain  in effect (meaning this test can be used) for the duration of the COVID-19 declaration under Section 564(b)(1) of the Act, 21 U.S.C.section 360bbb-3(b)(1), unless the authorization is terminated  or revoked sooner.       Influenza A by PCR NEGATIVE NEGATIVE Final    Influenza B by PCR NEGATIVE NEGATIVE Final    Comment: (NOTE) The Xpert Xpress SARS-CoV-2/FLU/RSV plus assay is intended as an aid in the diagnosis of influenza from Nasopharyngeal swab specimens and should not be used as a sole basis for treatment. Nasal washings and aspirates are unacceptable for Xpert Xpress SARS-CoV-2/FLU/RSV testing.  Fact Sheet for Patients: EntrepreneurPulse.com.au  Fact Sheet for Healthcare Providers: IncredibleEmployment.be  This test is not yet approved or cleared by the [place]  FDA and has been authorized for detection and/or diagnosis of SARS-CoV-2 by FDA under an Emergency Use Authorization (EUA). This EUA will remain in effect (meaning this test can be used) for the duration of the COVID-19 declaration under Section 564(b)(1) of the Act, 21 U.S.C. section 360bbb-3(b)(1), unless the authorization is terminated or revoked.  Performed at [facility], [place] [address] [place], [place] [address]       Radiology Studies: MR BRAIN WO CONTRAST  Result Date: 11/20/2021 CLINICAL DATA:  Neuro deficit, acute, stroke suspected. EXAM: MRI HEAD WITHOUT CONTRAST TECHNIQUE: Multiplanar, multiecho pulse sequences of the brain and surrounding structures were obtained without intravenous contrast. COMPARISON:  Head CT and CTA 11/20/2021.  Head MRI 06/10/2017. FINDINGS: The study is intermittently up to moderately motion degraded. Brain: There is no evidence of an acute infarct, intracranial hemorrhage, mass, midline shift, or extra-axial fluid collection. T2 hyperintensities in the cerebral white matter bilaterally are unchanged from the prior MRI and are nonspecific but compatible with mild chronic small vessel ischemic disease. Moderately advanced cerebral atrophy has mildly progressed from the prior MRI. A cavum septum pellucidum is noted. Vascular: Major intracranial vascular flow voids are preserved. Skull and upper  cervical spine: No suspicious marrow lesion. Sinuses/Orbits: Bilateral cataract extraction. Paranasal sinuses and mastoid air cells are clear. Other: None. IMPRESSION: 1. No acute intracranial abnormality. 2. Mild chronic small vessel ischemic disease and moderately advanced cerebral atrophy. Electronically Signed   By: Logan Bores M.D.   On: 11/20/2021 21:27   DG Chest Portable 1 View  Result Date: 11/20/2021 CLINICAL DATA:  Reason for exam: AMS EXAM: PORTABLE CHEST - 1 VIEW COMPARISON:  11/12/2019 FINDINGS: Low lung volumes. Linear subsegmental atelectasis versus infiltrate in the right lower lung. Atelectasis or consolidation obscures the left diaphragmatic leaflet. Can not exclude small left effusion. Heart size normal. Fracture deformity of left humerus partially visualized. IMPRESSION: Low volumes with bibasilar atelectasis or infiltrates, left worse than right. Electronically Signed   By: Lucrezia Europe M.D.   On: 11/20/2021 16:48   EEG adult  Result Date: 11/20/2021 Lora Havens, MD     11/20/2021  4:26 PM Patient Name: Kelsey Hanna MRN: [id number] Epilepsy Attending: Lora Havens Referring Physician/Provider: Amie Portland, MD Date: 11/20/2021 Duration: 34.39 mins Patient history: 71 year old woman brought in from nursing facility for evaluation of sudden onset of unresponsiveness with EMS concerned with a leftward gaze. EEG to evaluate for seizure Level of alertness: Awake AEDs during EEG study: None Technical aspects: This EEG study was done with scalp electrodes positioned according to the 10-20 International system of electrode placement. Electrical activity was reviewed with band pass filter of 1-'70Hz'$ , sensitivity of 7 uV/mm, display speed of 38m/sec with a '60Hz'$  notched filter applied as appropriate. EEG data were recorded continuously and digitally stored.  Video monitoring was available and reviewed as appropriate. Description: No clear posterior dominant rhythm was seen. EEG showed  continuous generalized 3 to 6 Hz theta-delta slowing. Patient was noted to have episodes of left upper extremity tremor like movements intermittently. Concomitant EEG before, during and after the study didn't show any eeg changes to suggest seizure. Hyperventilation and photic stimulation were not performed.   ABNORMALITY - Continuous slow, generalized IMPRESSION: This study is suggestive of moderate diffuse encephalopathy, nonspecific etiology. No seizures or epileptiform discharges were seen throughout the recording. Patient was noted to have episodes of left upper extremity tremor like movements intermittently without concomitant EEG change. These episodes were most likely  NOT epileptic. Priyanka Barbra Sarks   CT ANGIO HEAD NECK W WO CM (CODE STROKE)  Result Date: 11/20/2021 CLINICAL DATA:  Stroke suspected EXAM: CT ANGIOGRAPHY HEAD AND NECK TECHNIQUE: Multidetector CT imaging of the head and neck was performed using the standard protocol during bolus administration of intravenous contrast. Multiplanar CT image reconstructions and MIPs were obtained to evaluate the vascular anatomy. Carotid stenosis measurements (when applicable) are obtained utilizing NASCET criteria, using the distal internal carotid diameter as the denominator. RADIATION DOSE REDUCTION: This exam was performed according to the departmental dose-optimization program which includes automated exposure control, adjustment of the mA and/or kV according to patient size and/or use of iterative reconstruction technique. CONTRAST:  35m OMNIPAQUE IOHEXOL 350 MG/ML SOLN COMPARISON:  None Available. FINDINGS: CT HEAD FINDINGS Brain: No evidence of acute infarction, hemorrhage, hydrocephalus, extra-axial collection or mass lesion/mass effect. Vascular: See below Skull: Normal. Negative for fracture or focal lesion. Sinuses/Orbits: Bilateral lens replacements. Other: None. Review of the MIP images confirms the above findings CTA NECK FINDINGS Aortic arch:  Standard branching. Imaged portion shows no evidence of aneurysm or dissection. No significant stenosis of the major arch vessel origins. Right carotid system: No evidence of dissection, stenosis (50% or greater), or occlusion. Left carotid system: No evidence of dissection, stenosis (50% or greater), or occlusion. Vertebral arteries: Codominant. No evidence of dissection, stenosis (50% or greater), or occlusion. Skeleton: Negative. Other neck: Negative. Upper chest: Small left pleural effusion. Mild centrilobular emphysema. Review of the MIP images confirms the above findings CTA HEAD FINDINGS Anterior circulation: Redemonstrated right cavernous ICA stent with mild narrowing of the distal end. 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 Redemonstrated mild narrowing of the proximal aspect of the M2 segment on the right (series 7, image 126 Posterior circulation: Right is hypoplastic. The right P2 is predominantly supplied by the right PCOM. This is unchanged compared to MRA from 2019. Venous sinuses: As permitted by contrast timing, patent. Anatomic variants: As above Review of the MIP images confirms the above findings IMPRESSION: 1. No acute intracranial process. 2. No hemodynamically significant stenosis in the neck. 3. Redemonstrated right cavernous ICA stent with mild narrowing of the distal end. 4. 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 5. Visualized upper chest is notable for centrilobular emphysema and a small left pleural effusion. Emphysema (ICD10-J43.9). Electronically Signed   By: HMarin RobertsM.D.   On: 11/20/2021 13:25   CT HEAD CODE STROKE WO CONTRAST  Result Date: 11/20/2021 CLINICAL DATA:  Code stroke. Neuro deficit, acute, stroke suspected. Left gaze. Generalized weakness. Patient is nonverbal. EXAM: CT HEAD  WITHOUT CONTRAST TECHNIQUE: Contiguous axial images were obtained from the base of the skull through the vertex without intravenous contrast. RADIATION DOSE REDUCTION: This exam was performed according to the departmental dose-optimization program which includes automated exposure control, adjustment of the mA and/or kV according to patient size and/or use of iterative reconstruction technique. COMPARISON:  CT head without contrast 11/12/2019 FINDINGS: Brain: Moderate generalized atrophy and white matter disease is stable. No acute infarct, hemorrhage, or mass lesion is present. Basal ganglia are within normal limits. Insular ribbon is normal. No acute or focal cortical abnormality is present. Cavum septum pellucidum is present. The ventricles are proportionate to the degree of atrophy. No significant extraaxial fluid collection is present. The brainstem and cerebellum are within normal  limits. Vascular: A stent is present within the cavernous right ICA. Hyperdense vessel is present. Skull: Calvarium is intact. No focal lytic or blastic lesions are present. No significant extracranial soft tissue lesion is present. Sinuses/Orbits: The paranasal sinuses and mastoid air cells are clear. Bilateral lens replacements are noted. Globes and orbits are otherwise unremarkable. ASPECTS [facility] Stroke Program Early CT Score) - Ganglionic level infarction (caudate, lentiform nuclei, internal capsule, insula, M1-M3 cortex): 7/7 - Supraganglionic infarction (M4-M6 cortex): 3/3 Total score (0-10 with 10 being normal): 10/10 IMPRESSION: 1. No acute intracranial abnormality or significant interval change. 2. Stable atrophy and white matter disease. This likely reflects the sequela of chronic microvascular ischemia. 3. Stent within the cavernous right ICA. 4. Aspects is 10/10. The above was relayed via text pager to Dr. Rory Percy on 11/20/2021 at 13:05 . Electronically Signed   By: San Morelle M.D.   On: 11/20/2021 13:05       Scheduled Meds:  atorvastatin  20 mg Oral Daily   carbidopa-levodopa-entacapone  1 tablet Oral TID   divalproex  500 mg Oral BID   [START ON 11/22/2021] levothyroxine  100 mcg Oral QAC breakfast   mirtazapine  7.5 mg Oral QHS   OLANZapine  20 mg Oral QHS   pantoprazole  20 mg Oral Daily   sertraline  50 mg Oral Daily   sodium chloride flush  3 mL Intravenous Q12H   Continuous Infusions:  cefTRIAXone (ROCEPHIN)  IV Stopped (11/20/21 1540)   lactated ringers 100 mL/hr at 11/21/21 1041     LOS: 0 days     Dessa Phi, DO [facility] 11/21/2021, 1:56 PM   Available via Epic secure chat 7am-7pm After these hours, please refer to coverage provider listed on [email or web address]

## 2021-11-21 NOTE — ED Notes (Signed)
Choi MD made aware that pt did not pass swallow screen and is unable to tolerate PO med intake. Speech consult placed.

## 2021-11-21 NOTE — ED Notes (Signed)
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 above was relayed via text pager to Dr. Rory Percy on 11/20/2021 at 13:05 . Electronically Signed   By: San Morelle M.D.   On: 11/20/2021 13:05    Pending Labs Unresulted Labs (From admission, onward)     Start     Ordered   Unscheduled  Occult blood card to lab, stool  As needed,   R      11/20/21 2235   Unscheduled  Occult blood card to lab, stool  As needed,   R      11/20/21 1733            Vitals/Pain Today's Vitals   11/21/21 1030 11/21/21 1041 11/21/21 1045 11/21/21 1100  BP: 122/71  (!) 130/112 124/67  Pulse: 86  93 85  Resp: '18  20 19   '$ Temp:  (!) 97.3 F (36.3 C)    TempSrc:  Oral    SpO2: 99%  99% 99%  Weight:      Height:      PainSc:        Isolation Precautions No active isolations  Medications Medications  cefTRIAXone (ROCEPHIN) 2 g in sodium chloride 0.9 % 100 mL IVPB (0 g Intravenous Stopped 11/20/21 1540)  lactated ringers infusion ( Intravenous New Bag/Given 11/21/21 1041)  atorvastatin (LIPITOR) tablet 20 mg (40 mg Oral Not Given 11/20/21 2245)  OLANZapine zydis (ZYPREXA) disintegrating tablet 20 mg (20 mg Oral Not Given 11/20/21 2245)  levothyroxine (SYNTHROID) tablet 112 mcg (0 mcg Oral Hold 11/21/21 1037)  divalproex (DEPAKOTE SPRINKLE) capsule 500 mg (500 mg Oral Not Given 11/20/21 2245)  sodium chloride flush (NS) 0.9 % injection 3 mL (3 mLs Intravenous Given 11/21/21 1036)  acetaminophen (TYLENOL) tablet 650 mg (has no administration in time range)    Or  acetaminophen (TYLENOL) suppository 650 mg (has no administration in time range)  HYDROcodone-acetaminophen (  NORCO/VICODIN) 5-325 MG per tablet 1-2 tablet (has no administration in time range)  ondansetron (ZOFRAN) tablet 4 mg (has no administration in time range)    Or  ondansetron (ZOFRAN) injection 4 mg (has no administration in time range)  carbidopa-levodopa (SINEMET CR) 50-200 MG per tablet controlled release 1 tablet (has no administration in time range)  entacapone (COMTAN) tablet 200 mg (has no administration in time range)  iohexol (OMNIPAQUE) 350 MG/ML injection 75 mL (75 mLs Intravenous Contrast Given 11/20/21 1307)  lactated ringers bolus 1,000 mL (0 mLs Intravenous Stopped 11/20/21 1601)  azithromycin (ZITHROMAX) 500 mg in sodium chloride 0.9 % 250 mL IVPB (0 mg Intravenous Stopped 11/20/21 2239)    Mobility non-ambulatory High fall risk   Focused Assessments Neuro Assessment Handoff:  Swallow screen pass? Yes  Cardiac Rhythm: Normal sinus rhythm NIH Stroke Scale ( + Modified Stroke Scale Criteria)  Interval: Initial Level of  Consciousness (1a.)   : Not alert, requires repeated stimulation to attend, or is obtunded and requires strong or painful stimulation to make movements (not stereotyped) LOC Questions (1b. )   +: Answers neither question correctly LOC Commands (1c. )   + : Performs neither task correctly Best Gaze (2. )  +: Forced deviation (visually impaired) Visual (3. 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 )    : Normal symmetrical movements Motor Arm, Left (5a. )   +: No movement Motor Arm, Right (5b. )   +: No movement Motor Leg, Left (6a. )   +: No movement Motor Leg, Right (6b. )   +: No movement Limb Ataxia (7. ): Absent Sensory (8. )   +: Normal, no sensory loss Best Language (9. )   +: Severe aphasia Dysarthria (10. ): Severe dysarthria, patient's speech is so slurred as to be unintelligible in the absence of or out of proportion to any dysphasia, or is mute/anarthric Extinction/Inattention (11.)   +: No Abnormality Modified SS Total  +: 27 Complete NIHSS TOTAL: 31 Last date known well: 11/20/21 Last time known well: 1130 Neuro Assessment:   Neuro Checks:   Initial (11/20/21 1300)  Last Documented NIHSS Modified Score: 27 (11/21/21 1041) Has TPA been given? No If patient is a Neuro Trauma and patient is going to OR before floor call report to [place] nurse: [phone] or [phone]   R Recommendations: See Admitting Provider Note  Report given to:   Additional Notes:

## 2021-11-22 DIAGNOSIS — G9341 Metabolic encephalopathy: Secondary | ICD-10-CM | POA: Diagnosis not present

## 2021-11-22 MED ORDER — LEVOTHYROXINE SODIUM 100 MCG/5ML IV SOLN: 50.0000 ug | Freq: Every day | INTRAVENOUS | Status: DC

## 2021-11-22 MED ORDER — VALPROATE SODIUM 100 MG/ML IV SOLN
500.0000 mg | Freq: Two times a day (BID) | INTRAVENOUS | Status: DC
  Administered 2021-11-22 – 2021-11-23 (×4): 500 mg via INTRAVENOUS
  Filled 2021-11-22 (×7): qty 5

## 2021-11-22 NOTE — Plan of Care (Signed)

## 2021-11-22 NOTE — Progress Notes (Signed)
PROGRESS NOTE    Kelsey Hanna  LGX:[id number] DOB: 1950/06/06 DOA: 11/20/2021 PCP: Pcp, No     Brief Narrative:  Kelsey Hanna is a 71 y.o. female with medical history significant of vascular dementia, hypothyroidism, depression, bipolar disorder, dyslipidemia, chronic hypoxemic respiratory failure on 2 L oxygen, Parkinson's disease who was sent to the emergency department from her skilled nursing facility on 10/4.  Per report, patient was in a nursing facility due to being unresponsive.  EMS noted her to be gazing to the left, was nonverbal and non participatory.  She was sent to the emergency department as code stroke.    Neurology evaluated patient in the emergency department.  Patient underwent CT head, CTA head and neck which were unremarkable.  She also underwent stat EEG which was negative for epileptic seizure.  Neurology thought that her presentation was more likely to be toxic metabolic encephalopathy.  UA was grossly positive and patient started on antibiotics.  New events last 24 hours / Subjective: Patient alert and able to answer some yes/no and says "ok" but unable to hold any meaningful conversation. She doesn't follow commands as well as she did yesterday. Per nursing, she refused breakfast and meds and lab was unable to draw blood work x 3 attempts.   Assessment & Plan:  Principal Problem:   Acute metabolic encephalopathy Active Problems:   Hypothyroidism   Bipolar disorder ([place])   HLD (hyperlipidemia)   Legal blindness [place]   Sepsis secondary to UTI ([place])   Chronic respiratory failure with hypoxia ([facility])   Acute metabolic encephalopathy -Could be secondary to UTI -CT head: No acute intracranial abnormality or significant interval change. -CTA head and neck: No acute intracranial process. No hemodynamically significant stenosis in the neck. -EEG: This study is suggestive of moderate diffuse encephalopathy, nonspecific etiology. No seizures or epileptiform  discharges were seen throughout the recording. Patient was noted to have episodes of left upper extremity tremor like movements intermittently without concomitant EEG change. These episodes were most likely NOT epileptic.  -UDS negative -MRI brain: No acute intracranial abnormality -Hold gabapentin, zyprexa, remeron  -Appreciate neurology -Improved from admission but not to baseline    Sepsis due to proteus mirabilis UTI -Sepsis present on admission with heart rate > 90, respiratory rate > 20 -UA positive for moderate leukocytes, many bacteria, >50 WBC -Blood culture negative to date -Urine culture > 100,000 proteus mirabilis, sensitive to cephalosporin, Cipro -Continue Rocephin -IV fluid, lower rate today    Chronic hypoxic respiratory failure -Uses 2 L oxygen at baseline   Parkinson's -Carbidopa-Levodopa-Entacapone    Hypothyroidism -Synthroid --> lowered dose, free T4 marginally elevated, TSH elevated. Needs repeat TSH in 4-6 weeks.  Changed to IV   Vascular dementia -Namenda   Hyperlipidemia -Lipitor   Bipolar disorder -Depakote changed to IV   Question GI bleed -FOBT positive at facility and they set up outpatient GI referral -Hgb stable -FOBT --> negative here   DVT prophylaxis:  SCDs Start: 11/20/21 2235  Code Status: DNR, confirmed with daughter Family Communication: Left voicemail for daughter to call us back for update today Disposition Plan:  Status is: Inpatient Remains inpatient appropriate because: Remains on IV antibiotics   Antimicrobials:  Anti-infectives (From admission, onward)    Start     Dose/Rate Route Frequency Ordered Stop   11/20/21 1930  azithromycin (ZITHROMAX) 500 mg in sodium chloride 0.9 % 250 mL IVPB        500 mg 250 mL/hr over 60 Minutes Intravenous  Once 11/20/21 1926 11/20/21 2239   11/20/21 1430  cefTRIAXone (ROCEPHIN) 2 g in sodium chloride 0.9 % 100 mL IVPB        2 g 200 mL/hr over 30 Minutes Intravenous Every 24 hours  11/20/21 1423 11/27/21 1429        Objective: Vitals:   11/21/21 2000 11/21/21 2339 11/22/21 0355 11/22/21 0826  BP: (!) 122/98 (!) 137/102 101/76 (!) 123/93  Pulse: 91 86 86   Resp: (!) '21 19 19 14  '$ Temp: 97.9 F (36.6 C) 97.7 F (36.5 C) (!) 97.5 F (36.4 C) 97.6 F (36.4 C)  TempSrc: Oral Oral Oral Axillary  SpO2: 98% 99% 92% 99%  Weight:      Height:        Intake/Output Summary (Last 24 hours) at 11/22/2021 1214 Last data filed at 11/22/2021 0500 Gross per 24 hour  Intake 2113.09 ml  Output 200 ml  Net 1913.09 ml    Filed Weights   11/20/21 1200 11/20/21 1305  Weight: 78.4 kg 78 kg    Examination:  General exam: Appears calm  Respiratory system: Clear to auscultation. Respiratory effort normal. No respiratory distress.  Cardiovascular system: S1 & S2 heard, RRR. No murmurs.  Gastrointestinal system: Abdomen is nondistended, soft and nontender.  Central nervous system: Alert, answers mostly yes/no Extremities: + Edema upper extremities  Data Reviewed: I have personally reviewed following labs and imaging studies  CBC: Recent Labs  Lab 11/20/21 1255 11/20/21 1259 11/21/21 0358  WBC 10.5  --  6.6  NEUTROABS 9.3*  --   --   HGB 10.3* 10.5* 9.2*  HCT 32.3* 31.0* 29.0*  MCV 97.6  --  99.0  PLT 315  --  161    Basic Metabolic Panel: Recent Labs  Lab 11/20/21 1255 11/20/21 1259 11/21/21 0358  NA 141 138 144  K 4.1 4.1 3.8  CL 103 103 105  CO2 24  --  26  GLUCOSE 117* 107* 77  BUN '16 19 17  '$ CREATININE 0.68 0.50 0.34*  CALCIUM 9.2  --  9.0    GFR: Estimated Creatinine Clearance: 62.8 mL/min (A) (by C-G formula based on SCr of 0.34 mg/dL (L)). Liver Function Tests: Recent Labs  Lab 11/20/21 1255  AST 35  ALT 6  ALKPHOS 72  BILITOT 1.2  PROT 5.8*  ALBUMIN 2.7*    No results for input(s): "LIPASE", "AMYLASE" in the last 168 hours. No results for input(s): "AMMONIA" in the last 168 hours. Coagulation Profile: Recent Labs  Lab  11/20/21 1255  INR 1.1    Cardiac Enzymes: No results for input(s): "CKTOTAL", "CKMB", "CKMBINDEX", "TROPONINI" in the last 168 hours. BNP (last 3 results) No results for input(s): "PROBNP" in the last 8760 hours. HbA1C: No results for input(s): "HGBA1C" in the last 72 hours. CBG: Recent Labs  Lab 11/20/21 1248  GLUCAP 100*    Lipid Profile: No results for input(s): "CHOL", "HDL", "LDLCALC", "TRIG", "CHOLHDL", "LDLDIRECT" in the last 72 hours. Thyroid Function Tests: Recent Labs    11/20/21 1828 11/21/21 0358  TSH 11.535*  --   FREET4  --  1.18*    Anemia Panel: No results for input(s): "VITAMINB12", "FOLATE", "FERRITIN", "TIBC", "IRON", "RETICCTPCT" in the last 72 hours. Sepsis Labs: Recent Labs  Lab 11/20/21 1316  LATICACIDVEN 1.4     Recent Results (from the past 240 hour(s))  Blood culture (routine x 2)     Status: None (Preliminary result)   Collection Time: 11/20/21  1:16 PM  Specimen: BLOOD LEFT FOREARM  Result Value Ref Range Status   Specimen Description BLOOD LEFT FOREARM  Final   Special Requests   Final    BOTTLES DRAWN AEROBIC AND ANAEROBIC Blood Culture adequate volume   Culture   Final    NO GROWTH 2 DAYS Performed at [facility], [address], [place], [place] [address]    Report Status PENDING  Incomplete  Urine Culture     Status: Abnormal (Preliminary result)   Collection Time: 11/20/21  1:16 PM   Specimen: In/Out Cath Urine  Result Value Ref Range Status   Specimen Description IN/OUT CATH URINE  Final   Special Requests NONE  Final   Culture >=100,000 COLONIES/mL PROTEUS MIRABILIS (A)  Final   Report Status PENDING  Incomplete   Organism ID, Bacteria PROTEUS MIRABILIS (A)  Final      Susceptibility   Proteus mirabilis - MIC*    AMPICILLIN >=32 RESISTANT Resistant     CEFAZOLIN 8 SENSITIVE Sensitive     CEFEPIME <=0.12 SENSITIVE Sensitive     CEFTRIAXONE <=0.25 SENSITIVE Sensitive     CIPROFLOXACIN <=0.25 SENSITIVE  Sensitive     GENTAMICIN <=1 SENSITIVE Sensitive     IMIPENEM 2 SENSITIVE Sensitive     NITROFURANTOIN 128 RESISTANT Resistant     TRIMETH/SULFA >=320 RESISTANT Resistant     AMPICILLIN/SULBACTAM >=32 RESISTANT Resistant     PIP/TAZO Value in next row Sensitive      <=4 SENSITIVEPerformed at [place] [address], [place], [place] [address]    * >=100,000 COLONIES/mL PROTEUS MIRABILIS  Blood culture (routine x 2)     Status: None (Preliminary result)   Collection Time: 11/20/21  1:21 PM   Specimen: BLOOD RIGHT FOREARM  Result Value Ref Range Status   Specimen Description BLOOD RIGHT FOREARM  Final   Special Requests   Final    BOTTLES DRAWN AEROBIC AND ANAEROBIC Blood Culture adequate volume   Culture   Final    NO GROWTH 2 DAYS Performed at [facility], [place] [address], [place],  [address]    Report Status PENDING  Incomplete  Resp Panel by RT-PCR (Flu A&B, Covid) Anterior Nasal Swab     Status: None   Collection Time: 11/20/21  3:22 PM   Specimen: Anterior Nasal Swab  Result Value Ref Range Status   SARS Coronavirus 2 by RT PCR NEGATIVE NEGATIVE Final    Comment: (NOTE) SARS-CoV-2 target nucleic acids are NOT DETECTED.  The SARS-CoV-2 RNA is generally detectable in upper respiratory specimens during the acute phase of infection. The lowest concentration of SARS-CoV-2 viral copies this assay can detect is 138 copies/mL. A negative result does not preclude SARS-Cov-2 infection and should not be used as the sole basis for treatment or other patient management decisions. A negative result may occur with  improper specimen collection/handling, submission of specimen other than nasopharyngeal swab, presence of viral mutation(s) within the areas targeted by this assay, and inadequate number of viral copies(<138 copies/mL). A negative result must be combined with clinical observations, patient history, and epidemiological information. The expected result is  Negative.  Fact Sheet for Patients:  EntrepreneurPulse.com.au  Fact Sheet for Healthcare Providers:  IncredibleEmployment.be  This test is no t yet approved or cleared by the [place] FDA and  has been authorized for detection and/or diagnosis of SARS-CoV-2 by FDA under an Emergency Use Authorization (EUA). This EUA will remain  in effect (meaning this test  can be used) for the duration of the COVID-19 declaration under Section 564(b)(1) of the Act, 21 U.S.C.section 360bbb-3(b)(1), unless the authorization is terminated  or revoked sooner.       Influenza A by PCR NEGATIVE NEGATIVE Final   Influenza B by PCR NEGATIVE NEGATIVE Final    Comment: (NOTE) The Xpert Xpress SARS-CoV-2/FLU/RSV plus assay is intended as an aid in the diagnosis of influenza from Nasopharyngeal swab specimens and should not be used as a sole basis for treatment. Nasal washings and aspirates are unacceptable for Xpert Xpress SARS-CoV-2/FLU/RSV testing.  Fact Sheet for Patients: EntrepreneurPulse.com.au  Fact Sheet for Healthcare Providers: IncredibleEmployment.be  This test is not yet approved or cleared by the [place] FDA and has been authorized for detection and/or diagnosis of SARS-CoV-2 by FDA under an Emergency Use Authorization (EUA). This EUA will remain in effect (meaning this test can be used) for the duration of the COVID-19 declaration under Section 564(b)(1) of the Act, 21 U.S.C. section 360bbb-3(b)(1), unless the authorization is terminated or revoked.  Performed at [facility], [place] [address], [place], [place] [address]       Radiology Studies: MR BRAIN WO CONTRAST  Result Date: 11/20/2021 CLINICAL DATA:  Neuro deficit, acute, stroke suspected. EXAM: MRI HEAD WITHOUT CONTRAST TECHNIQUE: Multiplanar, multiecho pulse sequences of the brain and surrounding structures were obtained without  intravenous contrast. COMPARISON:  Head CT and CTA 11/20/2021.  Head MRI 06/10/2017. FINDINGS: The study is intermittently up to moderately motion degraded. Brain: There is no evidence of an acute infarct, intracranial hemorrhage, mass, midline shift, or extra-axial fluid collection. T2 hyperintensities in the cerebral white matter bilaterally are unchanged from the prior MRI and are nonspecific but compatible with mild chronic small vessel ischemic disease. Moderately advanced cerebral atrophy has mildly progressed from the prior MRI. A cavum septum pellucidum is noted. Vascular: Major intracranial vascular flow voids are preserved. Skull and upper cervical spine: No suspicious marrow lesion. Sinuses/Orbits: Bilateral cataract extraction. Paranasal sinuses and mastoid air cells are clear. Other: None. IMPRESSION: 1. No acute intracranial abnormality. 2. Mild chronic small vessel ischemic disease and moderately advanced cerebral atrophy. Electronically Signed   By: Logan Bores M.D.   On: 11/20/2021 21:27   DG Chest Portable 1 View  Result Date: 11/20/2021 CLINICAL DATA:  Reason for exam: AMS EXAM: PORTABLE CHEST - 1 VIEW COMPARISON:  11/12/2019 FINDINGS: Low lung volumes. Linear subsegmental atelectasis versus infiltrate in the right lower lung. Atelectasis or consolidation obscures the left diaphragmatic leaflet. Can not exclude small left effusion. Heart size normal. Fracture deformity of left humerus partially visualized. IMPRESSION: Low volumes with bibasilar atelectasis or infiltrates, left worse than right. Electronically Signed   By: Lucrezia Europe M.D.   On: 11/20/2021 16:48   EEG adult  Result Date: 11/20/2021 Lora Havens, MD     11/20/2021  4:26 PM Patient Name: Kelsey Hanna MRN: [id number] Epilepsy Attending: Lora Havens Referring Physician/Provider: Amie Portland, MD Date: 11/20/2021 Duration: 34.39 mins Patient history: 71 year old woman brought in from nursing facility for evaluation  of sudden onset of unresponsiveness with EMS concerned with a leftward gaze. EEG to evaluate for seizure Level of alertness: Awake AEDs during EEG study: None Technical aspects: This EEG study was done with scalp electrodes positioned according to the 10-20 International system of electrode placement. Electrical activity was reviewed with band pass filter of 1-'70Hz'$ , sensitivity of 7 uV/mm, display speed of 62m/sec with a '60Hz'$  notched filter applied as appropriate.  EEG data were recorded continuously and digitally stored.  Video monitoring was available and reviewed as appropriate. Description: No clear posterior dominant rhythm was seen. EEG showed continuous generalized 3 to 6 Hz theta-delta slowing. Patient was noted to have episodes of left upper extremity tremor like movements intermittently. Concomitant EEG before, during and after the study didn't show any eeg changes to suggest seizure. Hyperventilation and photic stimulation were not performed.   ABNORMALITY - Continuous slow, generalized IMPRESSION: This study is suggestive of moderate diffuse encephalopathy, nonspecific etiology. No seizures or epileptiform discharges were seen throughout the recording. Patient was noted to have episodes of left upper extremity tremor like movements intermittently without concomitant EEG change. These episodes were most likely NOT epileptic. Priyanka Barbra Sarks   CT ANGIO HEAD NECK W WO CM (CODE STROKE)  Result Date: 11/20/2021 CLINICAL DATA:  Stroke suspected EXAM: CT ANGIOGRAPHY HEAD AND NECK TECHNIQUE: Multidetector CT imaging of the head and neck was performed using the standard protocol during bolus administration of intravenous contrast. Multiplanar CT image reconstructions and MIPs were obtained to evaluate the vascular anatomy. Carotid stenosis measurements (when applicable) are obtained utilizing NASCET criteria, using the distal internal carotid diameter as the denominator. RADIATION DOSE REDUCTION: This exam  was performed according to the departmental dose-optimization program which includes automated exposure control, adjustment of the mA and/or kV according to patient size and/or use of iterative reconstruction technique. CONTRAST:  13m OMNIPAQUE IOHEXOL 350 MG/ML SOLN COMPARISON:  None Available. FINDINGS: CT HEAD FINDINGS Brain: No evidence of acute infarction, hemorrhage, hydrocephalus, extra-axial collection or mass lesion/mass effect. Vascular: See below Skull: Normal. Negative for fracture or focal lesion. Sinuses/Orbits: Bilateral lens replacements. Other: None. Review of the MIP images confirms the above findings CTA NECK FINDINGS Aortic arch: Standard branching. Imaged portion shows no evidence of aneurysm or dissection. No significant stenosis of the major arch vessel origins. Right carotid system: No evidence of dissection, stenosis (50% or greater), or occlusion. Left carotid system: No evidence of dissection, stenosis (50% or greater), or occlusion. Vertebral arteries: Codominant. No evidence of dissection, stenosis (50% or greater), or occlusion. Skeleton: Negative. Other neck: Negative. Upper chest: Small left pleural effusion. Mild centrilobular emphysema. Review of the MIP images confirms the above findings CTA HEAD FINDINGS Anterior circulation: Redemonstrated right cavernous ICA stent with mild narrowing of the distal end. 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 Redemonstrated mild narrowing of the proximal aspect of the M2 segment on the right (series 7, image 126 Posterior circulation: Right is hypoplastic. The right P2 is predominantly supplied by the right PCOM. This is unchanged compared to MRA from 2019. Venous sinuses: As permitted by contrast timing, patent. Anatomic variants: As above Review of the MIP images confirms the above findings IMPRESSION: 1. No acute intracranial process. 2. No  hemodynamically significant stenosis in the neck. 3. Redemonstrated right cavernous ICA stent with mild narrowing of the distal end. 4. 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 5. Visualized upper chest is notable for centrilobular emphysema and a small left pleural effusion. Emphysema (ICD10-J43.9). Electronically Signed   By: HMarin RobertsM.D.   On: 11/20/2021 13:25   CT HEAD CODE STROKE WO CONTRAST  Result Date: 11/20/2021 CLINICAL DATA:  Code stroke. Neuro deficit, acute, stroke suspected. Left gaze. Generalized weakness. Patient is nonverbal. EXAM: CT HEAD WITHOUT CONTRAST TECHNIQUE: Contiguous  axial images were obtained from the base of the skull through the vertex without intravenous contrast. RADIATION DOSE REDUCTION: This exam was performed according to the departmental dose-optimization program which includes automated exposure control, adjustment of the mA and/or kV according to patient size and/or use of iterative reconstruction technique. COMPARISON:  CT head without contrast 11/12/2019 FINDINGS: Brain: Moderate generalized atrophy and white matter disease is stable. No acute infarct, hemorrhage, or mass lesion is present. Basal ganglia are within normal limits. Insular ribbon is normal. No acute or focal cortical abnormality is present. Cavum septum pellucidum is present. The ventricles are proportionate to the degree of atrophy. No significant extraaxial fluid collection is present. The brainstem and cerebellum are within normal limits. Vascular: A stent is present within the cavernous right ICA. Hyperdense vessel is present. Skull: Calvarium is intact. No focal lytic or blastic lesions are present. No significant extracranial soft tissue lesion is present. Sinuses/Orbits: The paranasal sinuses and mastoid air cells are clear. Bilateral lens replacements are noted. Globes and orbits are otherwise unremarkable.  ASPECTS [facility] Stroke Program Early CT Score) - Ganglionic level infarction (caudate, lentiform nuclei, internal capsule, insula, M1-M3 cortex): 7/7 - Supraganglionic infarction (M4-M6 cortex): 3/3 Total score (0-10 with 10 being normal): 10/10 IMPRESSION: 1. No acute intracranial abnormality or significant interval change. 2. Stable atrophy and white matter disease. This likely reflects the sequela of chronic microvascular ischemia. 3. Stent within the cavernous right ICA. 4. Aspects is 10/10. The above was relayed via text pager to Dr. Rory Percy on 11/20/2021 at 13:05 . Electronically Signed   By: San Morelle M.D.   On: 11/20/2021 13:05      Scheduled Meds:  atorvastatin  20 mg Oral Daily   entacapone  200 mg Oral TID   And   carbidopa-levodopa  1 tablet Oral TID   [START ON 11/29/2021] levothyroxine  50 mcg Intravenous Daily   sertraline  50 mg Oral Daily   sodium chloride flush  3 mL Intravenous Q12H   Continuous Infusions:  cefTRIAXone (ROCEPHIN)  IV Stopped (11/21/21 1624)   lactated ringers 100 mL/hr at 11/22/21 0102   valproate sodium       LOS: 1 day     Dessa Phi, DO [facility] 11/22/2021, 12:14 PM   Available via Epic secure chat 7am-7pm After these hours, please refer to coverage provider listed on [email or web address]

## 2021-11-22 NOTE — Progress Notes (Signed)
Alana of phlebotomy states this the 3rd unsuccessful attempt to collect CBC. Per [facility] policy lab will not be attempted for a 24 hour period. Alana states Maylene Roes, MD was at bedside and is aware of non collection. RN Eldred Manges, MD and Fransisco Beau, charge RN of situation.

## 2021-11-23 DIAGNOSIS — G9341 Metabolic encephalopathy: Secondary | ICD-10-CM | POA: Diagnosis not present

## 2021-11-23 LAB — GLUCOSE, CAPILLARY: Glucose-Capillary: 93 mg/dL (ref 70–99)

## 2021-11-23 LAB — CBC
HCT: 29.2 % — ABNORMAL LOW (ref 36.0–46.0)
Hemoglobin: 9.6 g/dL — ABNORMAL LOW (ref 12.0–15.0)
MCH: 31.2 pg (ref 26.0–34.0)
MCHC: 32.9 g/dL (ref 30.0–36.0)
MCV: 94.8 fL (ref 80.0–100.0)
Platelets: 299 10*3/uL (ref 150–400)
RBC: 3.08 MIL/uL — ABNORMAL LOW (ref 3.87–5.11)
RDW: 19.9 % — ABNORMAL HIGH (ref 11.5–15.5)
WBC: 6.9 10*3/uL (ref 4.0–10.5)
nRBC: 0 % (ref 0.0–0.2)

## 2021-11-23 LAB — BASIC METABOLIC PANEL
Anion gap: 10 (ref 5–15)
BUN: 10 mg/dL (ref 8–23)
CO2: 26 mmol/L (ref 22–32)
Calcium: 8.8 mg/dL — ABNORMAL LOW (ref 8.9–10.3)
Chloride: 102 mmol/L (ref 98–111)
Creatinine, Ser: 0.43 mg/dL — ABNORMAL LOW (ref 0.44–1.00)
GFR, Estimated: >60 mL/min (ref >60)
Glucose, Bld: 107 mg/dL — ABNORMAL HIGH (ref 70–99)
Potassium: 3.4 mmol/L — ABNORMAL LOW (ref 3.5–5.1)
Sodium: 138 mmol/L (ref 135–145)

## 2021-11-23 MED ORDER — LEVOTHYROXINE SODIUM 100 MCG PO TABS
100.0000 ug | ORAL_TABLET | Freq: Every day | ORAL | Status: DC
  Administered 2021-11-24 – 2021-11-28 (×5): 100 ug via ORAL
  Filled 2021-11-23 (×5): qty 1

## 2021-11-23 MED ORDER — POTASSIUM CHLORIDE 20 MEQ PO PACK
40.0000 meq | PACK | Freq: Once | ORAL | Status: AC
  Administered 2021-11-23: 40 meq via ORAL
  Filled 2021-11-23: qty 2

## 2021-11-23 NOTE — Progress Notes (Signed)
PROGRESS NOTE    Kelsey Hanna  EPP:[id number] DOB: [birth year] DOA: 11/20/2021 PCP: Pcp, No     Brief Narrative:  Kelsey Hanna is a 71 y.o. female with medical history significant of vascular dementia, hypothyroidism, depression, bipolar disorder, dyslipidemia, chronic hypoxemic respiratory failure on 2 L oxygen, Parkinson's disease who was sent to the emergency department from her skilled nursing facility on 10/4.  Per report, patient was in a nursing facility due to being unresponsive.  EMS noted her to be gazing to the left, was nonverbal and non participatory.  She was sent to the emergency department as code stroke.    Neurology evaluated patient in the emergency department.  Patient underwent CT head, CTA head and neck which were unremarkable.  She also underwent stat EEG which was negative for epileptic seizure.  Neurology thought that her presentation was more likely to be toxic metabolic encephalopathy.  UA was grossly positive and patient started on antibiotics.  New events last 24 hours / Subjective: Patient seen and examined, spoke with daughter outside of patient's room.  Daughter states that patient seems to be improved since yesterday, was able to form sentences better.  She was able to eat a good amount of breakfast and drink fluids this morning.  On my examination, she is not as interactive, but is alert.  She does not answer questions appropriately.  Assessment & Plan:  Principal Problem:   Acute metabolic encephalopathy Active Problems:   Hypothyroidism   Bipolar disorder ([place])   HLD (hyperlipidemia)   Legal blindness [place]   Sepsis secondary to UTI ([place])   Chronic respiratory failure with hypoxia ([facility])   Acute metabolic encephalopathy -Could be secondary to UTI -CT head: No acute intracranial abnormality or significant interval change. -CTA head and neck: No acute intracranial process. No hemodynamically significant stenosis in the neck. -EEG: This study is  suggestive of moderate diffuse encephalopathy, nonspecific etiology. No seizures or epileptiform discharges were seen throughout the recording. Patient was noted to have episodes of left upper extremity tremor like movements intermittently without concomitant EEG change. These episodes were most likely NOT epileptic.  -UDS negative -MRI brain: No acute intracranial abnormality -Hold gabapentin, zyprexa, remeron  -Appreciate neurology -Improved from admission but not to baseline    Sepsis due to proteus mirabilis UTI -Sepsis present on admission with heart rate > 90, respiratory rate > 20 -UA positive for moderate leukocytes, many bacteria, >50 WBC -Blood culture negative to date -Urine culture > 100,000 proteus mirabilis, sensitive to cephalosporin, Cipro -Continue Rocephin  Cervical dystonia -Trial Robaxin -PT OT   Hypokalemia -Replace   Chronic hypoxic respiratory failure -Uses 2 L oxygen at baseline   Parkinson's -Carbidopa-Levodopa-Entacapone    Hypothyroidism -Synthroid --> lowered dose, free T4 marginally elevated, TSH elevated. Needs repeat TSH in 4-6 weeks.    Vascular dementia -Namenda   Hyperlipidemia -Lipitor   Bipolar disorder -Depakote changed to IV   Question GI bleed -FOBT positive at facility and they set up outpatient GI referral -Hgb stable -FOBT --> negative here   DVT prophylaxis:  SCDs Start: 11/20/21 2235  Code Status: DNR, confirmed with daughter Family Communication: Daughter Disposition Plan:  Status is: Inpatient Remains inpatient appropriate because: Remains on IV antibiotics   Antimicrobials:  Anti-infectives (From admission, onward)    Start     Dose/Rate Route Frequency Ordered Stop   11/20/21 1930  azithromycin (ZITHROMAX) 500 mg in sodium chloride 0.9 % 250 mL IVPB  500 mg 250 mL/hr over 60 Minutes Intravenous  Once 11/20/21 1926 11/20/21 2239   11/20/21 1430  cefTRIAXone (ROCEPHIN) 2 g in sodium chloride 0.9 % 100 mL  IVPB        2 g 200 mL/hr over 30 Minutes Intravenous Every 24 hours 11/20/21 1423 11/27/21 1429        Objective: Vitals:   11/22/21 1949 11/22/21 2308 11/23/21 0400 11/23/21 0832  BP: 120/72 (!) 152/81 131/73 (!) 126/52  Pulse: 88 85 90   Resp: 19 17 (!) 21 18  Temp: 97.9 F (36.6 C) 98 F (36.7 C) 97.8 F (36.6 C) 97.7 F (36.5 C)  TempSrc: Axillary Oral Oral Oral  SpO2: 96% 96% 97% 99%  Weight:      Height:        Intake/Output Summary (Last 24 hours) at 11/23/2021 1110 Last data filed at 11/22/2021 2300 Gross per 24 hour  Intake 530 ml  Output --  Net 530 ml    Filed Weights   11/20/21 1200 11/20/21 1305  Weight: 78.4 kg 78 kg    Examination:  General exam: Appears calm  Respiratory system: Clear to auscultation. Respiratory effort normal. No respiratory distress.  Cardiovascular system: S1 & S2 heard, RRR. No murmurs.  Gastrointestinal system: Abdomen is nondistended, soft and nontender.  Central nervous system: Alert Extremities: + Edema upper extremities  Data Reviewed: I have personally reviewed following labs and imaging studies  CBC: Recent Labs  Lab 11/20/21 1255 11/20/21 1259 11/21/21 0358 11/23/21 0959  WBC 10.5  --  6.6 6.9  NEUTROABS 9.3*  --   --   --   HGB 10.3* 10.5* 9.2* 9.6*  HCT 32.3* 31.0* 29.0* 29.2*  MCV 97.6  --  99.0 94.8  PLT 315  --  273 591    Basic Metabolic Panel: Recent Labs  Lab 11/20/21 1255 11/20/21 1259 11/21/21 0358 11/23/21 0959  NA 141 138 144 138  K 4.1 4.1 3.8 3.4*  CL 103 103 105 102  CO2 24  --  26 26  GLUCOSE 117* 107* 77 107*  BUN '16 19 17 10  '$ CREATININE 0.68 0.50 0.34* 0.43*  CALCIUM 9.2  --  9.0 8.8*    GFR: Estimated Creatinine Clearance: 62.8 mL/min (A) (by C-G formula based on SCr of 0.43 mg/dL (L)). Liver Function Tests: Recent Labs  Lab 11/20/21 1255  AST 35  ALT 6  ALKPHOS 72  BILITOT 1.2  PROT 5.8*  ALBUMIN 2.7*    No results for input(s): "LIPASE", "AMYLASE" in the last  168 hours. No results for input(s): "AMMONIA" in the last 168 hours. Coagulation Profile: Recent Labs  Lab 11/20/21 1255  INR 1.1    Cardiac Enzymes: No results for input(s): "CKTOTAL", "CKMB", "CKMBINDEX", "TROPONINI" in the last 168 hours. BNP (last 3 results) No results for input(s): "PROBNP" in the last 8760 hours. HbA1C: No results for input(s): "HGBA1C" in the last 72 hours. CBG: Recent Labs  Lab 11/20/21 1248 11/23/21 0833  GLUCAP 100* 93    Lipid Profile: No results for input(s): "CHOL", "HDL", "LDLCALC", "TRIG", "CHOLHDL", "LDLDIRECT" in the last 72 hours. Thyroid Function Tests: Recent Labs    11/20/21 1828 11/21/21 0358  TSH 11.535*  --   FREET4  --  1.18*    Anemia Panel: No results for input(s): "VITAMINB12", "FOLATE", "FERRITIN", "TIBC", "IRON", "RETICCTPCT" in the last 72 hours. Sepsis Labs: Recent Labs  Lab 11/20/21 1316  LATICACIDVEN 1.4     Recent Results (from  the past 240 hour(s))  Blood culture (routine x 2)     Status: None (Preliminary result)   Collection Time: 11/20/21  1:16 PM   Specimen: BLOOD LEFT FOREARM  Result Value Ref Range Status   Specimen Description BLOOD LEFT FOREARM  Final   Special Requests   Final    BOTTLES DRAWN AEROBIC AND ANAEROBIC Blood Culture adequate volume   Culture   Final    NO GROWTH 3 DAYS Performed at [facility], [address] [place], [place] [address]    Report Status PENDING  Incomplete  Urine Culture     Status: Abnormal (Preliminary result)   Collection Time: 11/20/21  1:16 PM   Specimen: In/Out Cath Urine  Result Value Ref Range Status   Specimen Description IN/OUT CATH URINE  Final   Special Requests NONE  Final   Culture >=100,000 COLONIES/mL PROTEUS MIRABILIS (A)  Final   Report Status PENDING  Incomplete   Organism ID, Bacteria PROTEUS MIRABILIS (A)  Final      Susceptibility   Proteus mirabilis - MIC*    AMPICILLIN >=32 RESISTANT Resistant     CEFAZOLIN 8 SENSITIVE  Sensitive     CEFEPIME <=0.12 SENSITIVE Sensitive     CEFTRIAXONE <=0.25 SENSITIVE Sensitive     CIPROFLOXACIN <=0.25 SENSITIVE Sensitive     GENTAMICIN <=1 SENSITIVE Sensitive     IMIPENEM 2 SENSITIVE Sensitive     NITROFURANTOIN 128 RESISTANT Resistant     TRIMETH/SULFA >=320 RESISTANT Resistant     AMPICILLIN/SULBACTAM >=32 RESISTANT Resistant     PIP/TAZO Value in next row Sensitive      <=4 SENSITIVEPerformed at [facility] [address], [place], [place] [address]    * >=100,000 COLONIES/mL PROTEUS MIRABILIS  Blood culture (routine x 2)     Status: None (Preliminary result)   Collection Time: 11/20/21  1:21 PM   Specimen: BLOOD RIGHT FOREARM  Result Value Ref Range Status   Specimen Description BLOOD RIGHT FOREARM  Final   Special Requests   Final    BOTTLES DRAWN AEROBIC AND ANAEROBIC Blood Culture adequate volume   Culture   Final    NO GROWTH 3 DAYS Performed at [facility], [place] [address] [place], [place] [address]    Report Status PENDING  Incomplete  Resp Panel by RT-PCR (Flu A&B, Covid) Anterior Nasal Swab     Status: None   Collection Time: 11/20/21  3:22 PM   Specimen: Anterior Nasal Swab  Result Value Ref Range Status   SARS Coronavirus 2 by RT PCR NEGATIVE NEGATIVE Final    Comment: (NOTE) SARS-CoV-2 target nucleic acids are NOT DETECTED.  The SARS-CoV-2 RNA is generally detectable in upper respiratory specimens during the acute phase of infection. The lowest concentration of SARS-CoV-2 viral copies this assay can detect is 138 copies/mL. A negative result does not preclude SARS-Cov-2 infection and should not be used as the sole basis for treatment or other patient management decisions. A negative result may occur with  improper specimen collection/handling, submission of specimen other than nasopharyngeal swab, presence of viral mutation(s) within the areas targeted by this assay, and inadequate number of viral copies(<138 copies/mL). A  negative result must be combined with clinical observations, patient history, and epidemiological information. The expected result is Negative.  Fact Sheet for Patients:  EntrepreneurPulse.com.au  Fact Sheet for Healthcare Providers:  IncredibleEmployment.be  This test is no t yet approved or cleared by the [place] and  has been authorized for detection and/or diagnosis of SARS-CoV-2 by FDA under an Emergency Use Authorization (EUA). This EUA will remain  in effect (meaning this test can be used) for the duration of the COVID-19 declaration under Section 564(b)(1) of the Act, 21 U.S.C.section 360bbb-3(b)(1), unless the authorization is terminated  or revoked sooner.       Influenza A by PCR NEGATIVE NEGATIVE Final   Influenza B by PCR NEGATIVE NEGATIVE Final    Comment: (NOTE) The Xpert Xpress SARS-CoV-2/FLU/RSV plus assay is intended as an aid in the diagnosis of influenza from Nasopharyngeal swab specimens and should not be used as a sole basis for treatment. Nasal washings and aspirates are unacceptable for Xpert Xpress SARS-CoV-2/FLU/RSV testing.  Fact Sheet for Patients: EntrepreneurPulse.com.au  Fact Sheet for Healthcare Providers: IncredibleEmployment.be  This test is not yet approved or cleared by the [place] FDA and has been authorized for detection and/or diagnosis of SARS-CoV-2 by FDA under an Emergency Use Authorization (EUA). This EUA will remain in effect (meaning this test can be used) for the duration of the COVID-19 declaration under Section 564(b)(1) of the Act, 21 U.S.C. section 360bbb-3(b)(1), unless the authorization is terminated or revoked.  Performed at [facility], [place] [address], [place], [place] [address]       Radiology Studies: No results found.    Scheduled Meds:  atorvastatin  20 mg Oral Daily   entacapone  200 mg Oral TID   And    carbidopa-levodopa  1 tablet Oral TID   [START ON 11/24/2021] levothyroxine  100 mcg Oral Q0600   sertraline  50 mg Oral Daily   sodium chloride flush  3 mL Intravenous Q12H   Continuous Infusions:  cefTRIAXone (ROCEPHIN)  IV Stopped (11/22/21 2237)   lactated ringers 75 mL/hr at 11/22/21 2244   valproate sodium 500 mg (11/23/21 1002)     LOS: 2 days     Dessa Phi, DO [facility] 11/23/2021, 11:10 AM   Available via Epic secure chat 7am-7pm After these hours, please refer to coverage provider listed on [email or web address]

## 2021-11-23 NOTE — Progress Notes (Signed)
PT Cancellation Note  Patient Details Name: Kelsey Hanna MRN: [id number] DOB: October 02, 1950   Cancelled Treatment:    Reason Eval/Treat Not Completed: Patient declined, no reason specified. Pt reports being cold, initially agrees to mobilize with PT once having a warm blanket, then declines any mobility. Pt communicating primarily with yes/no's which appear very accurate >75% of the time. PT will attempt to follow up as time allows and when the pt is more agreeable to participating.   Zenaida Niece 11/23/2021, 5:01 PM

## 2021-11-24 DIAGNOSIS — N3000 Acute cystitis without hematuria: Secondary | ICD-10-CM

## 2021-11-24 DIAGNOSIS — G9341 Metabolic encephalopathy: Secondary | ICD-10-CM | POA: Diagnosis not present

## 2021-11-24 LAB — MAGNESIUM: Magnesium: 1.3 mg/dL — ABNORMAL LOW (ref 1.7–2.4)

## 2021-11-24 LAB — BASIC METABOLIC PANEL
Anion gap: 12 (ref 5–15)
BUN: 10 mg/dL (ref 8–23)
CO2: 26 mmol/L (ref 22–32)
Calcium: 9.2 mg/dL (ref 8.9–10.3)
Chloride: 99 mmol/L (ref 98–111)
Creatinine, Ser: 0.5 mg/dL (ref 0.44–1.00)
GFR, Estimated: >60 mL/min (ref >60)
Glucose, Bld: 87 mg/dL (ref 70–99)
Potassium: 2.9 mmol/L — ABNORMAL LOW (ref 3.5–5.1)
Sodium: 137 mmol/L (ref 135–145)

## 2021-11-24 LAB — PHOSPHORUS: Phosphorus: 2.8 mg/dL (ref 2.5–4.6)

## 2021-11-24 MED ORDER — ENOXAPARIN SODIUM 40 MG/0.4ML IJ SOSY
40.0000 mg | PREFILLED_SYRINGE | Freq: Every day | INTRAMUSCULAR | Status: DC
  Administered 2021-11-24 – 2021-11-27 (×4): 40 mg via SUBCUTANEOUS
  Filled 2021-11-24 (×5): qty 0.4

## 2021-11-24 MED ORDER — POTASSIUM CHLORIDE 10 MEQ/100ML IV SOLN
10.0000 meq | INTRAVENOUS | Status: AC
  Administered 2021-11-24 (×2): 10 meq via INTRAVENOUS
  Filled 2021-11-24 (×2): qty 100

## 2021-11-24 MED ORDER — MAGNESIUM SULFATE 4 GM/100ML IV SOLN
4.0000 g | Freq: Once | INTRAVENOUS | Status: AC
  Administered 2021-11-24: 4 g via INTRAVENOUS
  Filled 2021-11-24 (×2): qty 100

## 2021-11-24 MED ORDER — POTASSIUM CHLORIDE CRYS ER 20 MEQ PO TBCR
40.0000 meq | EXTENDED_RELEASE_TABLET | Freq: Four times a day (QID) | ORAL | Status: AC
  Administered 2021-11-24 (×2): 40 meq via ORAL
  Filled 2021-11-24 (×2): qty 2

## 2021-11-24 MED ORDER — DIVALPROEX SODIUM 125 MG PO CSDR
500.0000 mg | DELAYED_RELEASE_CAPSULE | Freq: Two times a day (BID) | ORAL | Status: DC
  Administered 2021-11-24 – 2021-11-28 (×8): 500 mg via ORAL
  Filled 2021-11-24 (×9): qty 4

## 2021-11-24 NOTE — Evaluation (Signed)
Physical Therapy Evaluation Patient Details Name: Kelsey Hanna MRN: [id number] DOB: 23-Mar-1950 Today's Date: 11/24/2021  History of Present Illness  71 y.o. female who arrived 10/4 via EMS from her SNF with L gaze, nonverbal, and decreased responsiveness. CT negative. Pt admitted with dx of acute metabolic encephalopathy. PMH:  vascular dementia, hypothyroidism, depression, bipolar disorder, dyslipidemia, chronic hypoxemic respiratory failure on 2 L oxygen, Parkinson's disease   Clinical Impression  Pt admitted with above diagnosis. PTA pt resides in SNF. Unsure of mobility status. Pt currently with functional limitations due to the deficits listed below (see PT Problem List). On eval, she required +2 total assist bed mobility, and +2 total assist to maintain sitting balance EOB. Pt with heavy push into extension during sit. Unable to safely remain EOB, requiring return to supine after less than 1 minute. Eyes open but no eye contact and minimal verbalizations. Answering yes/no questions inconsistently toward end of session. Pt will benefit from skilled PT to increase their independence and safety with mobility to allow discharge to the venue listed below.          Recommendations for follow up therapy are one component of a multi-disciplinary discharge planning process, led by the attending physician.  Recommendations may be updated based on patient status, additional functional criteria and insurance authorization.  Follow Up Recommendations Skilled nursing-short term rehab (<3 hours/day) Can patient physically be transported by private vehicle: No    Assistance Recommended at Discharge Frequent or constant Supervision/Assistance  Patient can return home with the following       Equipment Recommendations None recommended by PT  Recommendations for Other Services       Functional Status Assessment Patient has had a recent decline in their functional status and/or demonstrates limited  ability to make significant improvements in function in a reasonable and predictable amount of time     Precautions / Restrictions Precautions Precautions: Fall      Mobility  Bed Mobility Overal bed mobility: Needs Assistance Bed Mobility: Rolling, Supine to Sit, Sit to Supine Rolling: +2 for physical assistance, Total assist   Supine to sit: +2 for physical assistance, Total assist Sit to supine: Total assist, +2 for physical assistance   General bed mobility comments: helicopter method using bed pad to pivot to EOB. Pt with heavy push into extension at trunk, hips, and knees. Unable to safely maintain sitting position, requiring return to supine after less than a minute.    Transfers                   General transfer comment: unable    Ambulation/Gait                  Stairs            Wheelchair Mobility    Modified Rankin (Stroke Patients Only)       Balance Overall balance assessment: Needs assistance Sitting-balance support: Feet supported, No upper extremity supported Sitting balance-Leahy Scale: Zero Sitting balance - Comments: heavy push into extension Postural control: Posterior lean                                   Pertinent Vitals/Pain Pain Assessment Pain Assessment: Faces Faces Pain Scale: No hurt    Home Living Family/patient expects to be discharged to:: Skilled nursing facility  Additional Comments: [facility] resident at [facility]    Prior Function               Mobility Comments: unsure of mobility status at baseline       Hand Dominance        Extremity/Trunk Assessment   Upper Extremity Assessment Upper Extremity Assessment: Defer to OT evaluation    Lower Extremity Assessment Lower Extremity Assessment:  (unable to elicit any active movement BLE. Stiff rigid, with pt pulling into extension.)       Communication   Communication: Other (comment)  (mostly states yes/no with inconsistent accuracy)  Cognition Arousal/Alertness: Awake/alert Behavior During Therapy: Flat affect Overall Cognitive Status: No family/caregiver present to determine baseline cognitive functioning                                 General Comments: Minimally verbal. No interaction initially but then began answering yes/no questions inconsistently toward end of session. Eyes open but no eye contact with staff. Easily agitated.        General Comments General comments (skin integrity, edema, etc.): [place] in place on arrival but O2 turned off. SpO2 90-93% on RA.    Exercises     Assessment/Plan    PT Assessment Patient needs continued PT services  PT Problem List Decreased mobility;Decreased activity tolerance;Decreased cognition;Decreased balance;Obesity       PT Treatment Interventions Therapeutic activities;Cognitive remediation;Therapeutic exercise;Patient/family education;Balance training;Functional mobility training    PT Goals (Current goals can be found in the Care Plan section)  Acute Rehab PT Goals Patient Stated Goal: unable to state PT Goal Formulation: Patient unable to participate in goal setting Time For Goal Achievement: 12/08/21 Potential to Achieve Goals: Poor    Frequency Min 2X/week     Co-evaluation               AM-PAC PT "6 Clicks" Mobility  Outcome Measure Help needed turning from your back to your side while in a flat bed without using bedrails?: Total Help needed moving from lying on your back to sitting on the side of a flat bed without using bedrails?: Total Help needed moving to and from a bed to a chair (including a wheelchair)?: Total Help needed standing up from a chair using your arms (e.g., wheelchair or bedside chair)?: Total Help needed to walk in hospital room?: Total Help needed climbing 3-5 steps with a railing? : Total 6 Click Score: 6    End of Session   Activity Tolerance: Other  (comment) (cognitive status, possible bed bound at baseline) Patient left: in bed;with call bell/phone within reach;with bed alarm set Nurse Communication: Mobility status PT Visit Diagnosis: Other abnormalities of gait and mobility (R26.89)    Time: 6283-6629 PT Time Calculation (min) (ACUTE ONLY): 17 min   Charges:   PT Evaluation $PT Eval Moderate Complexity: 1 Mod          Lorrin Goodell, PT  Office # [phone] Pager [phone]   Lorriane Shire 11/24/2021, 9:37 AM

## 2021-11-24 NOTE — Evaluation (Signed)
Occupational Therapy Evaluation Patient Details Name: Kelsey Hanna MRN: [id number] DOB: 1950-04-03 Today's Date: 11/24/2021   History of Present Illness 71 y.o. female who arrived 10/4 via EMS from her SNF with L gaze, nonverbal, and decreased responsiveness. CT negative. Pt admitted with dx of acute metabolic encephalopathy. PMH:  vascular dementia, hypothyroidism, depression, bipolar disorder, dyslipidemia, chronic hypoxemic respiratory failure on 2 L oxygen, Parkinson's disease   Clinical Impression   Pt resides at [facility], pt unable to provide PLOF. Pt with BUE edema and minimal AROM noted in LUE. Pt able to respond to name and answers yes/no questions inconsistently, becoming increasingly agitated with communication/instructions. Pt total A +2 for all ADLs, and aspects of bed mobility, unable to sit EOB due to posterior lean into extension. Pt presenting with impairments listed below, will follow acutely. Recommend SNF at d/c.     Recommendations for follow up therapy are one component of a multi-disciplinary discharge planning process, led by the attending physician.  Recommendations may be updated based on patient status, additional functional criteria and insurance authorization.   Follow Up Recommendations  Skilled nursing-short term rehab (<3 hours/day)    Assistance Recommended at Discharge Frequent or constant Supervision/Assistance  Patient can return home with the following Two people to help with walking and/or transfers;Two people to help with bathing/dressing/bathroom;Assistance with cooking/housework;Assistance with feeding;Direct supervision/assist for medications management;Help with stairs or ramp for entrance;Assist for transportation;Direct supervision/assist for financial management    Functional Status Assessment  Patient has had a recent decline in their functional status and demonstrates the ability to make significant improvements in function in a  reasonable and predictable amount of time.  Equipment Recommendations  None recommended by OT (defer)    Recommendations for Other Services PT consult     Precautions / Restrictions Precautions Precautions: Fall Restrictions Weight Bearing Restrictions: Yes      Mobility Bed Mobility Overal bed mobility: Needs Assistance Bed Mobility: Rolling, Supine to Sit, Sit to Supine Rolling: +2 for physical assistance, Total assist   Supine to sit: +2 for physical assistance, Total assist Sit to supine: Total assist, +2 for physical assistance   General bed mobility comments: helicopter method using bed pad to pivot to EOB. Pt with heavy push into extension at trunk, hips, and knees. Unable to safely maintain sitting position, requiring return to supine after less than a minute.    Transfers                   General transfer comment: unable      Balance Overall balance assessment: Needs assistance Sitting-balance support: Feet supported, No upper extremity supported Sitting balance-Leahy Scale: Zero Sitting balance - Comments: heavy push into extension Postural control: Posterior lean                                 ADL either performed or assessed with clinical judgement   ADL Overall ADL's : Needs assistance/impaired Eating/Feeding: Total assistance   Grooming: Total assistance   Upper Body Bathing: Total assistance   Lower Body Bathing: Total assistance   Upper Body Dressing : Total assistance   Lower Body Dressing: Total assistance   Toilet Transfer: Total assistance   Toileting- Clothing Manipulation and Hygiene: Total assistance       Functional mobility during ADLs: Total assistance;+2 for physical assistance       Vision   Additional Comments: L gaze preference; needs further  assessment     Perception     Praxis      Pertinent Vitals/Pain Pain Assessment Pain Assessment: Faces Pain Score: 0-No pain     Hand Dominance      Extremity/Trunk Assessment Upper Extremity Assessment Upper Extremity Assessment: RUE deficits/detail;LUE deficits/detail;Difficult to assess due to impaired cognition RUE Deficits / Details: no active movement noted, tone in R hand, edematous. difficult to assess due to cognition RUE Coordination: decreased fine motor;decreased gross motor LUE Deficits / Details: able to hold forearm up against gravity when placed, PROM WFL, edematous, 1/5 grasp. difficult to assess due to cognition LUE Coordination: decreased fine motor;decreased gross motor   Lower Extremity Assessment Lower Extremity Assessment: Defer to PT evaluation       Communication Communication Communication: Other (comment) (consistently nods yes/no)   Cognition Arousal/Alertness: Awake/alert Behavior During Therapy: Flat affect Overall Cognitive Status: No family/caregiver present to determine baseline cognitive functioning                                 General Comments: Minimally verbal. No interaction initially but then began answering yes/no questions inconsistently toward end of session. Eyes open but no eye contact with staff. Easily agitated.     General Comments  VSS on RA    Exercises     Shoulder Instructions      Home Living Family/patient expects to be discharged to:: Skilled nursing facility                                 Additional Comments: Long term resident at [facility]      Prior Functioning/Environment               Mobility Comments: unsure of mobility status at baseline ADLs Comments: pt unable to provide, assume depenedent for ADLs        OT Problem List: Decreased strength;Decreased activity tolerance;Decreased range of motion;Impaired balance (sitting and/or standing);Decreased cognition;Decreased safety awareness;Impaired UE functional use      OT Treatment/Interventions: Self-care/ADL training;Therapeutic exercise;Energy  conservation;DME and/or AE instruction;Therapeutic activities;Patient/family education;Balance training;Cognitive remediation/compensation    OT Goals(Current goals can be found in the care plan section) Acute Rehab OT Goals Patient Stated Goal: none stated OT Goal Formulation: Patient unable to participate in goal setting Time For Goal Achievement: 12/08/21 Potential to Achieve Goals: Fair ADL Goals Pt Will Perform Eating: with min assist;with adaptive utensils;bed level;sitting;with caregiver independent in assisting Pt Will Perform Grooming: with min assist;bed level;with adaptive equipment;sitting;with caregiver independent in assisting Additional ADL Goal #1: pt will perform bed mobility max +2 in prep for ADLs  OT Frequency: Min 2X/week    Co-evaluation PT/OT/SLP Co-Evaluation/Treatment: Yes Reason for Co-Treatment: Complexity of the patient's impairments (multi-system involvement);For patient/therapist safety;To address functional/ADL transfers   OT goals addressed during session: ADL's and self-care;Strengthening/ROM      AM-PAC OT "6 Clicks" Daily Activity     Outcome Measure Help from another person eating meals?: Total Help from another person taking care of personal grooming?: Total Help from another person toileting, which includes using toliet, bedpan, or urinal?: Total Help from another person bathing (including washing, rinsing, drying)?: Total Help from another person to put on and taking off regular upper body clothing?: Total Help from another person to put on and taking off regular lower body clothing?: Total 6 Click Score: 6   End  of Session Equipment Utilized During Treatment: Gait belt;Rolling walker (2 wheels) Nurse Communication: Mobility status  Activity Tolerance: Patient tolerated treatment well Patient left: in bed;with call bell/phone within reach;with bed alarm set  OT Visit Diagnosis: Unsteadiness on feet (R26.81);Other abnormalities of gait and  mobility (R26.89);Muscle weakness (generalized) (M62.81);Other symptoms and signs involving cognitive function                Time: 6294-7654 OT Time Calculation (min): 17 min Charges:  OT General Charges $OT Visit: 1 Visit OT Evaluation $OT Eval Moderate Complexity: 1 [address] OTD, OTR/L Acute Rehab [phone]) 832 - [place] 11/24/2021, 12:44 PM

## 2021-11-24 NOTE — Progress Notes (Addendum)
PROGRESS NOTE    SHANIGUA GIBB  DGU:[id number] DOB: [birth year] DOA: 11/20/2021 PCP: Pcp, No     Brief Narrative:   Kelsey Hanna is a 71 y.o. female with medical history significant of vascular dementia, hypothyroidism, depression, bipolar disorder, dyslipidemia, chronic hypoxemic respiratory failure on 2 L oxygen, Parkinson's disease who was sent to the emergency department from her skilled nursing facility on 10/4.  Per report, patient was in a nursing facility due to being unresponsive.  EMS noted her to be gazing to the left, was nonverbal and non participatory.  She was sent to the emergency department as code stroke.    Neurology evaluated patient in the emergency department.  Patient underwent CT head, CTA head and neck which were unremarkable.  She also underwent stat EEG which was negative for epileptic seizure.  Neurology thought that her presentation was more likely to be toxic metabolic encephalopathy.  UA was grossly positive and patient started on antibiotics.  New events last 24 hours / Subjective:  No significant events over last 24 hours as discussed with staff, patient unable to provide any reliable complaints   Assessment & Plan:  Principal Problem:   Acute metabolic encephalopathy Active Problems:   Hypothyroidism   Bipolar disorder ([place])   HLD (hyperlipidemia)   Legal blindness [place]   Sepsis secondary to UTI ([place])   Chronic respiratory failure with hypoxia ([place])   Acute metabolic encephalopathy -Most likely related to UTI -CT head: No acute intracranial abnormality or significant interval change. -CTA head and neck: No acute intracranial process. No hemodynamically significant stenosis in the neck. -EEG: This study is suggestive of moderate diffuse encephalopathy, nonspecific etiology. No seizures or epileptiform discharges were seen throughout the recording. Patient was noted to have episodes of left upper extremity tremor like movements intermittently  without concomitant EEG change. These episodes were most likely NOT epileptic.  -UDS negative -MRI brain: No acute intracranial abnormality -Continue to hold sedative medicines including gabapentin, Zyprexa, and Remeron . -Appreciate neurology -Improved from admission but not to baseline    Sepsis due to proteus mirabilis UTI -Sepsis present on admission with heart rate > 90, respiratory rate > 20 -UA positive for moderate leukocytes, many bacteria, >50 WBC -Blood culture negative to date -Urine culture > 100,000 proteus mirabilis, sensitive to cephalosporin, Cipro -Continue Rocephin  Cervical dystonia -Trial Robaxin -PT OT   Hypokalemia -Replace   Chronic hypoxic respiratory failure -Uses 2 L oxygen at baseline   Parkinson's -Carbidopa-Levodopa-Entacapone    Hypothyroidism -Synthroid --> lowered dose, free T4 marginally elevated, TSH elevated. Needs repeat TSH in 4-6 weeks.    Vascular dementia -Namenda   Hyperlipidemia -Lipitor  Hypokalemia Hypomagnesemia -Repleted , recheck in a.m.   Bipolar disorder -Depakote changed to IV   Question GI bleed -FOBT positive at facility and they set up outpatient GI referral -Hgb stable -FOBT --> negative here   DVT prophylaxis:  enoxaparin (LOVENOX) injection 40 mg Start: 11/24/21 1000 SCDs Start: 11/20/21 2235  Code Status: DNR,  Family Communication: None at bedside Disposition Plan:  Status is: Inpatient Remains inpatient appropriate because: Remains on IV antibiotics   Antimicrobials:  Anti-infectives (From admission, onward)    Start     Dose/Rate Route Frequency Ordered Stop   11/20/21 1930  azithromycin (ZITHROMAX) 500 mg in sodium chloride 0.9 % 250 mL IVPB        500 mg 250 mL/hr over 60 Minutes Intravenous  Once 11/20/21 1926 11/20/21 2239   11/20/21 1430  cefTRIAXone (  ROCEPHIN) 2 g in sodium chloride 0.9 % 100 mL IVPB        2 g 200 mL/hr over 30 Minutes Intravenous Every 24 hours 11/20/21 1423  11/27/21 1429        Objective: Vitals:   11/23/21 1535 11/23/21 1952 11/23/21 2310 11/24/21 0749  BP: 98/72 (!) 89/43 101/62 121/74  Pulse: 76 (!) 104 81   Resp:   16 18  Temp: 97.7 F (36.5 C) 97.8 F (36.6 C) 97.8 F (36.6 C) 97.9 F (36.6 C)  TempSrc: Oral Oral Oral Oral  SpO2: 97%   93%  Weight:      Height:        Intake/Output Summary (Last 24 hours) at 11/24/2021 1119 Last data filed at 11/23/2021 1800 Gross per 24 hour  Intake 1143.98 ml  Output 500 ml  Net 643.98 ml   Filed Weights   11/20/21 1200 11/20/21 1305  Weight: 78.4 kg 78 kg    Examination:   Awake Alert, vented x1 demented, conversant, pleasant, but impaired cognition and insight symmetrical Chest wall movement,  CTAB RRR,No Gallops,Rubs or new Murmurs, No Parasternal Heave +ve B.Sounds, Abd Soft, No tenderness, No rebound - guarding or rigidity. No Cyanosis, Clubbing or + edema, No new Rash or bruise     Data Reviewed: I have personally reviewed following labs and imaging studies  CBC: Recent Labs  Lab 11/20/21 1255 11/20/21 1259 11/21/21 0358 11/23/21 0959  WBC 10.5  --  6.6 6.9  NEUTROABS 9.3*  --   --   --   HGB 10.3* 10.5* 9.2* 9.6*  HCT 32.3* 31.0* 29.0* 29.2*  MCV 97.6  --  99.0 94.8  PLT 315  --  273 283   Basic Metabolic Panel: Recent Labs  Lab 11/20/21 1255 11/20/21 1259 11/21/21 0358 11/23/21 0959 11/24/21 0508  NA 141 138 144 138 137  K 4.1 4.1 3.8 3.4* 2.9*  CL 103 103 105 102 99  CO2 24  --  '26 26 26  '$ GLUCOSE 117* 107* 77 107* 87  BUN '16 19 17 10 10  '$ CREATININE 0.68 0.50 0.34* 0.43* 0.50  CALCIUM 9.2  --  9.0 8.8* 9.2  MG  --   --   --   --  1.3*  PHOS  --   --   --   --  2.8   GFR: Estimated Creatinine Clearance: 62.8 mL/min (by C-G formula based on SCr of 0.5 mg/dL). Liver Function Tests: Recent Labs  Lab 11/20/21 1255  AST 35  ALT 6  ALKPHOS 72  BILITOT 1.2  PROT 5.8*  ALBUMIN 2.7*   No results for input(s): "LIPASE", "AMYLASE" in the last  168 hours. No results for input(s): "AMMONIA" in the last 168 hours. Coagulation Profile: Recent Labs  Lab 11/20/21 1255  INR 1.1   Cardiac Enzymes: No results for input(s): "CKTOTAL", "CKMB", "CKMBINDEX", "TROPONINI" in the last 168 hours. BNP (last 3 results) No results for input(s): "PROBNP" in the last 8760 hours. HbA1C: No results for input(s): "HGBA1C" in the last 72 hours. CBG: Recent Labs  Lab 11/20/21 1248 11/23/21 0833  GLUCAP 100* 93   Lipid Profile: No results for input(s): "CHOL", "HDL", "LDLCALC", "TRIG", "CHOLHDL", "LDLDIRECT" in the last 72 hours. Thyroid Function Tests: No results for input(s): "TSH", "T4TOTAL", "FREET4", "T3FREE", "THYROIDAB" in the last 72 hours. Anemia Panel: No results for input(s): "VITAMINB12", "FOLATE", "FERRITIN", "TIBC", "IRON", "RETICCTPCT" in the last 72 hours. Sepsis Labs: Recent Labs  Lab 11/20/21 1316  LATICACIDVEN 1.4    Recent Results (from the past 240 hour(s))  Blood culture (routine x 2)     Status: None (Preliminary result)   Collection Time: 11/20/21  1:16 PM   Specimen: BLOOD LEFT FOREARM  Result Value Ref Range Status   Specimen Description BLOOD LEFT FOREARM  Final   Special Requests   Final    BOTTLES DRAWN AEROBIC AND ANAEROBIC Blood Culture adequate volume   Culture   Final    NO GROWTH 4 DAYS Performed at [facility], [place] [address] [place], [place] [address]    Report Status PENDING  Incomplete  Urine Culture     Status: Abnormal (Preliminary result)   Collection Time: 11/20/21  1:16 PM   Specimen: In/Out Cath Urine  Result Value Ref Range Status   Specimen Description IN/OUT CATH URINE  Final   Special Requests NONE  Final   Culture (A)  Final    >=100,000 COLONIES/mL PROTEUS MIRABILIS CULTURE REINCUBATED FOR BETTER GROWTH Performed at [facility], [place] [address] [place], [place] [address]    Report Status PENDING  Incomplete   Organism ID, Bacteria PROTEUS MIRABILIS (A)  Final       Susceptibility   Proteus mirabilis - MIC*    AMPICILLIN >=32 RESISTANT Resistant     CEFAZOLIN 8 SENSITIVE Sensitive     CEFEPIME <=0.12 SENSITIVE Sensitive     CEFTRIAXONE <=0.25 SENSITIVE Sensitive     CIPROFLOXACIN <=0.25 SENSITIVE Sensitive     GENTAMICIN <=1 SENSITIVE Sensitive     IMIPENEM 2 SENSITIVE Sensitive     NITROFURANTOIN 128 RESISTANT Resistant     TRIMETH/SULFA >=320 RESISTANT Resistant     AMPICILLIN/SULBACTAM >=32 RESISTANT Resistant     PIP/TAZO <=4 SENSITIVE Sensitive     * >=100,000 COLONIES/mL PROTEUS MIRABILIS  Blood culture (routine x 2)     Status: None (Preliminary result)   Collection Time: 11/20/21  1:21 PM   Specimen: BLOOD RIGHT FOREARM  Result Value Ref Range Status   Specimen Description BLOOD RIGHT FOREARM  Final   Special Requests   Final    BOTTLES DRAWN AEROBIC AND ANAEROBIC Blood Culture adequate volume   Culture   Final    NO GROWTH 4 DAYS Performed at [facility], [address], [place], [place] [address]    Report Status PENDING  Incomplete  Resp Panel by RT-PCR (Flu A&B, Covid) Anterior Nasal Swab     Status: None   Collection Time: 11/20/21  3:22 PM   Specimen: Anterior Nasal Swab  Result Value Ref Range Status   SARS Coronavirus 2 by RT PCR NEGATIVE NEGATIVE Final    Comment: (NOTE) SARS-CoV-2 target nucleic acids are NOT DETECTED.  The SARS-CoV-2 RNA is generally detectable in upper respiratory specimens during the acute phase of infection. The lowest concentration of SARS-CoV-2 viral copies this assay can detect is 138 copies/mL. A negative result does not preclude SARS-Cov-2 infection and should not be used as the sole basis for treatment or other patient management decisions. A negative result may occur with  improper specimen collection/handling, submission of specimen other than nasopharyngeal swab, presence of viral mutation(s) within the areas targeted by this assay, and inadequate number of viral copies(<138  copies/mL). A negative result must be combined with clinical observations, patient history, and epidemiological information. The expected result is Negative.  Fact Sheet for Patients:  EntrepreneurPulse.com.au  Fact Sheet for Healthcare Providers:  IncredibleEmployment.be  This test is no t  yet approved or cleared by the [place] and  has been authorized for detection and/or diagnosis of SARS-CoV-2 by FDA under an Emergency Use Authorization (EUA). This EUA will remain  in effect (meaning this test can be used) for the duration of the COVID-19 declaration under Section 564(b)(1) of the Act, 21 U.S.C.section 360bbb-3(b)(1), unless the authorization is terminated  or revoked sooner.       Influenza A by PCR NEGATIVE NEGATIVE Final   Influenza B by PCR NEGATIVE NEGATIVE Final    Comment: (NOTE) The Xpert Xpress SARS-CoV-2/FLU/RSV plus assay is intended as an aid in the diagnosis of influenza from Nasopharyngeal swab specimens and should not be used as a sole basis for treatment. Nasal washings and aspirates are unacceptable for Xpert Xpress SARS-CoV-2/FLU/RSV testing.  Fact Sheet for Patients: EntrepreneurPulse.com.au  Fact Sheet for Healthcare Providers: IncredibleEmployment.be  This test is not yet approved or cleared by the [place] FDA and has been authorized for detection and/or diagnosis of SARS-CoV-2 by FDA under an Emergency Use Authorization (EUA). This EUA will remain in effect (meaning this test can be used) for the duration of the COVID-19 declaration under Section 564(b)(1) of the Act, 21 U.S.C. section 360bbb-3(b)(1), unless the authorization is terminated or revoked.  Performed at [facility], [place] [address] [place], [place] [address]       Radiology Studies: No results found.    Scheduled Meds:  atorvastatin  20 mg Oral Daily   entacapone  200 mg Oral TID    And   carbidopa-levodopa  1 tablet Oral TID   divalproex  500 mg Oral BID   enoxaparin (LOVENOX) injection  40 mg Subcutaneous Daily   levothyroxine  100 mcg Oral Q0600   potassium chloride  40 mEq Oral Q6H   sertraline  50 mg Oral Daily   sodium chloride flush  3 mL Intravenous Q12H   Continuous Infusions:  cefTRIAXone (ROCEPHIN)  IV 2 g (11/23/21 1416)   magnesium sulfate bolus IVPB       LOS: 3 days     Phillips Climes, MD [facility] 11/24/2021, 11:19 AM   Available via Epic secure chat 7am-7pm After these hours, please refer to coverage provider listed on [email or web address]

## 2021-11-25 DIAGNOSIS — F319 Bipolar disorder, unspecified: Secondary | ICD-10-CM

## 2021-11-25 DIAGNOSIS — J9611 Chronic respiratory failure with hypoxia: Secondary | ICD-10-CM

## 2021-11-25 DIAGNOSIS — R4182 Altered mental status, unspecified: Secondary | ICD-10-CM | POA: Diagnosis not present

## 2021-11-25 DIAGNOSIS — G9341 Metabolic encephalopathy: Secondary | ICD-10-CM | POA: Diagnosis not present

## 2021-11-25 LAB — CBC
HCT: 24.3 % — ABNORMAL LOW (ref 36.0–46.0)
Hemoglobin: 8.1 g/dL — ABNORMAL LOW (ref 12.0–15.0)
MCH: 30.8 pg (ref 26.0–34.0)
MCHC: 33.3 g/dL (ref 30.0–36.0)
MCV: 92.4 fL (ref 80.0–100.0)
Platelets: 321 10*3/uL (ref 150–400)
RBC: 2.63 MIL/uL — ABNORMAL LOW (ref 3.87–5.11)
RDW: 20 % — ABNORMAL HIGH (ref 11.5–15.5)
WBC: 7.7 10*3/uL (ref 4.0–10.5)
nRBC: 0 % (ref 0.0–0.2)

## 2021-11-25 LAB — BASIC METABOLIC PANEL
Anion gap: 9 (ref 5–15)
BUN: 8 mg/dL (ref 8–23)
CO2: 23 mmol/L (ref 22–32)
Calcium: 8.4 mg/dL — ABNORMAL LOW (ref 8.9–10.3)
Chloride: 103 mmol/L (ref 98–111)
Creatinine, Ser: 0.47 mg/dL (ref 0.44–1.00)
GFR, Estimated: >60 mL/min (ref >60)
Glucose, Bld: 91 mg/dL (ref 70–99)
Potassium: 4.4 mmol/L (ref 3.5–5.1)
Sodium: 135 mmol/L (ref 135–145)

## 2021-11-25 LAB — CULTURE, BLOOD (ROUTINE X 2)
Culture: NO GROWTH
Culture: NO GROWTH
Special Requests: ADEQUATE
Special Requests: ADEQUATE

## 2021-11-25 LAB — URINE CULTURE: Culture: >=100000 — AB

## 2021-11-25 LAB — PHOSPHORUS: Phosphorus: 2.5 mg/dL (ref 2.5–4.6)

## 2021-11-25 LAB — MAGNESIUM: Magnesium: 2.1 mg/dL (ref 1.7–2.4)

## 2021-11-25 MED ORDER — LINACLOTIDE 145 MCG PO CAPS
145.0000 ug | ORAL_CAPSULE | Freq: Every day | ORAL | Status: DC
  Administered 2021-11-26 – 2021-11-27 (×2): 145 ug via ORAL
  Filled 2021-11-25 (×3): qty 1

## 2021-11-25 MED ORDER — GABAPENTIN 100 MG PO CAPS
200.0000 mg | ORAL_CAPSULE | Freq: Every day | ORAL | Status: DC
  Administered 2021-11-25 – 2021-11-26 (×2): 200 mg via ORAL
  Filled 2021-11-25 (×3): qty 2

## 2021-11-25 MED ORDER — HYDROCODONE-ACETAMINOPHEN 5-325 MG PO TABS: 1.0000 | ORAL_TABLET | Freq: Four times a day (QID) | ORAL | Status: DC | PRN

## 2021-11-25 MED ORDER — GABAPENTIN 100 MG PO CAPS
100.0000 mg | ORAL_CAPSULE | Freq: Two times a day (BID) | ORAL | Status: DC
  Administered 2021-11-25 – 2021-11-27 (×5): 100 mg via ORAL
  Filled 2021-11-25 (×6): qty 1

## 2021-11-25 MED ORDER — AMOXICILLIN 500 MG PO CAPS
500.0000 mg | ORAL_CAPSULE | Freq: Three times a day (TID) | ORAL | Status: DC
  Administered 2021-11-25 – 2021-11-28 (×8): 500 mg via ORAL
  Filled 2021-11-25 (×10): qty 1

## 2021-11-25 MED ORDER — GABAPENTIN 100 MG PO CAPS: 100.0000 mg | ORAL_CAPSULE | ORAL | Status: DC

## 2021-11-25 MED ORDER — METHOCARBAMOL 500 MG PO TABS
500.0000 mg | ORAL_TABLET | Freq: Three times a day (TID) | ORAL | Status: DC | PRN
  Administered 2021-11-25 – 2021-11-26 (×2): 500 mg via ORAL
  Filled 2021-11-25 (×3): qty 1

## 2021-11-25 MED ORDER — GABAPENTIN 100 MG PO CAPS
100.0000 mg | ORAL_CAPSULE | Freq: Once | ORAL | Status: AC
  Administered 2021-11-25: 100 mg via ORAL
  Filled 2021-11-25: qty 1

## 2021-11-25 NOTE — Progress Notes (Signed)
Speech Language Pathology Treatment: Dysphagia  Patient Details Name: Kelsey Hanna MRN: [id number] DOB: September 22, 1950 Today's Date: 11/25/2021 Time: 0917-0940 SLP Time Calculation (min) (ACUTE ONLY): 23 min  Assessment / Plan / Recommendation Clinical Impression  Pt seen for f/u dysphagia tx/diet check with current diet of Dysphagia 3/thin liquids with pt refusing some consistencies and oral care prior to intake.  Swab utilized for oral sweep of buccal cavities and min oral/lingual residue noted in oral cavity.  Pt followed simple directives intermittently with mod multimodal cues provided by SLP partially d/t lethargy.  Pt spoke with nonsensical words during interactions, but did answer yes/no when asked for preference of food/liquids/ice chips.  When pt given environmental cues, she did open her mouth/consume liquids with small volumes via straw with mid-lingual pressure provided for increased sensory awareness.  Pt's overall awareness/alertness level places her at mild-mod risk for aspiration with deconditioning taken into account.  Pt would benefit from downgraded diet of Dysphagia 2 (minced)/thin liquids with general swallowing precautions in place. ST will continue to f/u for diet tolerance/education while in acute setting with f/u beneficial at next venue of care.    HPI HPI: Pt is a 71 y.o. female who presented at ED from her skilled nursing facility on 10/4.  Per report, pt found unresponsive in facility. EMS noted her to be gazing to the left, was nonverbal and non participatory. She was sent to ED as code stroke. Per MD note 10/5 "MRI and EEG negative for acute process. Likely toxic metabolic encephalopathy from UTI in the setting of pre-existing underlying neurodegenerative process". Failed yale swallow screen x2, once due to coughing and once due to lethargy. PMH: vascular dementia, hypothyroidism, depression, bipolar disorder, dyslipidemia, chronic hypoxemic respiratory failure on 2 L  oxygen, Parkinson's disease; ST recommended D3/thin at BSE completion; ST f/u for diet tolerance      SLP Plan  Goals updated      Recommendations for follow up therapy are one component of a multi-disciplinary discharge planning process, led by the attending physician.  Recommendations may be updated based on patient status, additional functional criteria and insurance authorization.    Recommendations  Diet recommendations: Dysphagia 2 (fine chop);Thin liquid Liquids provided via: Straw (guided sips) Medication Administration: Whole meds with puree Supervision: Full supervision/cueing for compensatory strategies;Trained caregiver to feed patient Compensations: Slow rate;Small sips/bites;Minimize environmental distractions;Lingual sweep for clearance of pocketing Postural Changes and/or Swallow Maneuvers: Seated upright 90 degrees                Oral Care Recommendations: Oral care BID;Staff/trained caregiver to provide oral care Follow Up Recommendations: Skilled nursing-short term rehab (<3 hours/day) Assistance recommended at discharge: Frequent or constant Supervision/Assistance SLP Visit Diagnosis: Dysphagia, oropharyngeal phase (R13.12) Plan: Goals updated           Elvina Sidle, M.S., CCC-SLP  11/25/2021, 9:39 AM

## 2021-11-25 NOTE — Progress Notes (Addendum)
PROGRESS NOTE        PATIENT DETAILS Name: Kelsey Hanna Age: 71 y.o. Sex: female Date of Birth: 09-Oct-1950 Admit Date: 11/20/2021 Admitting Physician Dessa Phi, DO PCP:Pcp, No  Brief Summary: Patient is a 71 y.o.  female with history of dementia, Parkinson's disease, chronic hypoxic respiratory failure on 2 L of oxygen, bipolar disorder-who presented to the ED with acute metabolic encephalopathy in the setting of complicated UTI.  Significant events: 10/4>> admit to [facility]-severe encephalopathy in the setting of complicated UTI.  Significant studies: 10/4>>CXR: bibasilar infiltrates left> right 10/4>> CT head: No acute intracranial abnormality. 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 10/4>> MRI brain: No acute CVA. 10/4>> EEG: No seizures.  Significant microbiology data: 10/4>> urine culture: VRE, Proteus mirabilis 10/4>> blood culture: No growth 10/4>> COVID/influenza PCR: Negative  Procedures: None  Consults: None  Subjective: Awake-confused-no family at bedside.  Objective: Vitals: Blood pressure 116/74, pulse (!) 101, temperature 99 F (37.2 C), temperature source Oral, resp. rate 18, height '5\' 3"'$  (1.6 m), weight 78 kg, SpO2 93 %.   Exam: Gen Exam:not in any distress HEENT:atraumatic, normocephalic Chest: B/L clear to auscultation anteriorly CVS:S1S2 regular Abdomen:soft non tender, non distended Extremities:no edema Neurology: Seems to move all 4 extremities. Skin: no rash  Pertinent Labs/Radiology:    Latest Ref Rng & Units 11/25/2021    1:38 AM 11/23/2021    9:59 AM 11/21/2021    3:58 AM  CBC  WBC 4.0 - 10.5 K/uL 7.7  6.9  6.6   Hemoglobin 12.0 - 15.0 g/dL 8.1  9.6  9.2   Hematocrit 36.0 - 46.0 % 24.3  29.2  29.0   Platelets 150 - 400 K/uL 321  299  273     Lab Results  Component  Value Date   NA 135 11/25/2021   K 4.4 11/25/2021   CL 103 11/25/2021   CO2 23 11/25/2021     Assessment/Plan: Acute metabolic encephalopathy Probably 2/2 UTI-superimposed on dementia Neuroimaging negative for CVA, EEG negative for seizures. Discussed with daughter Sharyn Lull over the phone-although improved not yet at baseline.   Daughter aware that she would likely need to be back in usual surroundings to see if she will return to her baseline.   Patient will continue to have delirium as long as she is hospitalized.   Sepsis due to complicated UTI (VRE/Proteus) Sepsis physiology resolved Cultures as above-we will discuss with pharmacy team and adjust antibiotic regimen accordingly  Parkinson's disease Continue Sinemet/Comtan  History of vascular dementia Bipolar disorder Maintain delirium precautions Continue Depakote/Zoloft  Cervical dystonia Seems to be somewhat better-poor historian Continue Robaxin Mobilize with PT/OT  Chronic hypoxic respiratory failure On 2 L of oxygen at baseline  Hypothyroidism Continue Synthroid-dosage has been adjusted-repeat TSH in 4-6 weeks  HLD Statin  Peripheral neuropathy Resume Neurontin  FOBT positive at SNF Outpatient GI referral apparently has been set up a prior notes Interestingly-FOBT negative here Hb trending down  Obesity: Estimated body mass index is 30.47 kg/m as calculated from the following:   Height as of this encounter: '5\' 3"'$  (1.6 m).   Weight as of this encounter: 78 kg.   Code status:   Code Status: DNR   DVT Prophylaxis: enoxaparin (LOVENOX) injection  40 mg Start: 11/24/21 1000 SCDs Start: 11/20/21 2235    Family Communication:  Daughter-Michelle-[phone]-updated on 10/9   Disposition Plan: Status is: Inpatient Remains inpatient appropriate because: Resolving encephalopathy-not yet at baseline.   Planned Discharge Destination:Skilled nursing facility   Diet: Diet Order             DIET  DYS 2 Room service appropriate? Yes; Fluid consistency: Thin  Diet effective now                     Antimicrobial agents: Anti-infectives (From admission, onward)    Start     Dose/Rate Route Frequency Ordered Stop   11/20/21 1930  azithromycin (ZITHROMAX) 500 mg in sodium chloride 0.9 % 250 mL IVPB        500 mg 250 mL/hr over 60 Minutes Intravenous  Once 11/20/21 1926 11/20/21 2239   11/20/21 1430  cefTRIAXone (ROCEPHIN) 2 g in sodium chloride 0.9 % 100 mL IVPB        2 g 200 mL/hr over 30 Minutes Intravenous Every 24 hours 11/20/21 1423 11/27/21 1429        MEDICATIONS: Scheduled Meds:  atorvastatin  20 mg Oral Daily   entacapone  200 mg Oral TID   And   carbidopa-levodopa  1 tablet Oral TID   divalproex  500 mg Oral BID   enoxaparin (LOVENOX) injection  40 mg Subcutaneous Daily   levothyroxine  100 mcg Oral Q0600   sertraline  50 mg Oral Daily   sodium chloride flush  3 mL Intravenous Q12H   Continuous Infusions:  cefTRIAXone (ROCEPHIN)  IV 2 g (11/24/21 1400)   PRN Meds:.acetaminophen **OR** acetaminophen, HYDROcodone-acetaminophen, ondansetron **OR** ondansetron (ZOFRAN) IV   I have personally reviewed following labs and imaging studies  LABORATORY DATA: CBC: Recent Labs  Lab 11/20/21 1255 11/20/21 1259 11/21/21 0358 11/23/21 0959 11/25/21 0138  WBC 10.5  --  6.6 6.9 7.7  NEUTROABS 9.3*  --   --   --   --   HGB 10.3* 10.5* 9.2* 9.6* 8.1*  HCT 32.3* 31.0* 29.0* 29.2* 24.3*  MCV 97.6  --  99.0 94.8 92.4  PLT 315  --  273 299 536    Basic Metabolic Panel: Recent Labs  Lab 11/20/21 1255 11/20/21 1259 11/21/21 0358 11/23/21 0959 11/24/21 0508 11/25/21 0138  NA 141 138 144 138 137 135  K 4.1 4.1 3.8 3.4* 2.9* 4.4  CL 103 103 105 102 99 103  CO2 24  --  '26 26 26 23  '$ GLUCOSE 117* 107* 77 107* 87 91  BUN '16 19 17 10 10 8  '$ CREATININE 0.68 0.50 0.34* 0.43* 0.50 0.47  CALCIUM 9.2  --  9.0 8.8* 9.2 8.4*  MG  --   --   --   --  1.3* 2.1  PHOS   --   --   --   --  2.8 2.5    GFR: Estimated Creatinine Clearance: 62.8 mL/min (by C-G formula based on SCr of 0.47 mg/dL).  Liver Function Tests: Recent Labs  Lab 11/20/21 1255  AST 35  ALT 6  ALKPHOS 72  BILITOT 1.2  PROT 5.8*  ALBUMIN 2.7*   No results for input(s): "LIPASE", "AMYLASE" in the last 168 hours. No results for input(s): "AMMONIA" in the last 168 hours.  Coagulation Profile: Recent Labs  Lab 11/20/21 1255  INR 1.1    Cardiac Enzymes: No results for input(s): "CKTOTAL", "CKMB", "CKMBINDEX", "TROPONINI" in the last 168  hours.  BNP (last 3 results) No results for input(s): "PROBNP" in the last 8760 hours.  Lipid Profile: No results for input(s): "CHOL", "HDL", "LDLCALC", "TRIG", "CHOLHDL", "LDLDIRECT" in the last 72 hours.  Thyroid Function Tests: No results for input(s): "TSH", "T4TOTAL", "FREET4", "T3FREE", "THYROIDAB" in the last 72 hours.  Anemia Panel: No results for input(s): "VITAMINB12", "FOLATE", "FERRITIN", "TIBC", "IRON", "RETICCTPCT" in the last 72 hours.  Urine analysis:    Component Value Date/Time   COLORURINE AMBER (A) 11/20/2021 1330   APPEARANCEUR CLOUDY (A) 11/20/2021 1330   LABSPEC >1.046 (H) 11/20/2021 1330   PHURINE 7.0 11/20/2021 1330   GLUCOSEU NEGATIVE 11/20/2021 1330   HGBUR NEGATIVE 11/20/2021 1330   BILIRUBINUR SMALL (A) 11/20/2021 1330   KETONESUR 20 (A) 11/20/2021 1330   PROTEINUR 100 (A) 11/20/2021 1330   UROBILINOGEN 0.2 10/20/2014 0814   NITRITE NEGATIVE 11/20/2021 1330   LEUKOCYTESUR MODERATE (A) 11/20/2021 1330    Sepsis Labs: Lactic Acid, Venous    Component Value Date/Time   LATICACIDVEN 1.4 11/20/2021 1316    MICROBIOLOGY: Recent Results (from the past 240 hour(s))  Blood culture (routine x 2)     Status: None (Preliminary result)   Collection Time: 11/20/21  1:16 PM   Specimen: BLOOD LEFT FOREARM  Result Value Ref Range Status   Specimen Description BLOOD LEFT FOREARM  Final   Special Requests    Final    BOTTLES DRAWN AEROBIC AND ANAEROBIC Blood Culture adequate volume   Culture   Final    NO GROWTH 4 DAYS Performed at [facility], [place] [address] [place], [place] [address]    Report Status PENDING  Incomplete  Urine Culture     Status: Abnormal   Collection Time: 11/20/21  1:16 PM   Specimen: In/Out Cath Urine  Result Value Ref Range Status   Specimen Description IN/OUT CATH URINE  Final   Special Requests   Final    NONE Performed at [facility], [place] [address], [place], [place] [address]    Culture (A)  Final    >=100,000 COLONIES/mL PROTEUS MIRABILIS >=100,000 COLONIES/mL ENTEROCOCCUS FAECALIS VANCOMYCIN RESISTANT ENTEROCOCCUS ISOLATED    Report Status 11/25/2021 FINAL  Final   Organism ID, Bacteria PROTEUS MIRABILIS (A)  Final   Organism ID, Bacteria ENTEROCOCCUS FAECALIS (A)  Final      Susceptibility   Enterococcus faecalis - MIC*    AMPICILLIN <=2 SENSITIVE Sensitive     NITROFURANTOIN <=16 SENSITIVE Sensitive     VANCOMYCIN >=32 RESISTANT Resistant     LINEZOLID 2 SENSITIVE Sensitive     * >=100,000 COLONIES/mL ENTEROCOCCUS FAECALIS   Proteus mirabilis - MIC*    AMPICILLIN >=32 RESISTANT Resistant     CEFAZOLIN 8 SENSITIVE Sensitive     CEFEPIME <=0.12 SENSITIVE Sensitive     CEFTRIAXONE <=0.25 SENSITIVE Sensitive     CIPROFLOXACIN <=0.25 SENSITIVE Sensitive     GENTAMICIN <=1 SENSITIVE Sensitive     IMIPENEM 2 SENSITIVE Sensitive     NITROFURANTOIN 128 RESISTANT Resistant     TRIMETH/SULFA >=320 RESISTANT Resistant     AMPICILLIN/SULBACTAM >=32 RESISTANT Resistant     PIP/TAZO <=4 SENSITIVE Sensitive     * >=100,000 COLONIES/mL PROTEUS MIRABILIS  Blood culture (routine x 2)     Status: None (Preliminary result)   Collection Time: 11/20/21  1:21 PM   Specimen: BLOOD RIGHT FOREARM  Result Value Ref Range Status   Specimen Description BLOOD RIGHT FOREARM  Final   Special Requests  Final    BOTTLES DRAWN AEROBIC AND ANAEROBIC Blood  Culture adequate volume   Culture   Final    NO GROWTH 4 DAYS Performed at [facility], [place] [address], [place], [place] [address]    Report Status PENDING  Incomplete  Resp Panel by RT-PCR (Flu A&B, Covid) Anterior Nasal Swab     Status: None   Collection Time: 11/20/21  3:22 PM   Specimen: Anterior Nasal Swab  Result Value Ref Range Status   SARS Coronavirus 2 by RT PCR NEGATIVE NEGATIVE Final    Comment: (NOTE) SARS-CoV-2 target nucleic acids are NOT DETECTED.  The SARS-CoV-2 RNA is generally detectable in upper respiratory specimens during the acute phase of infection. The lowest concentration of SARS-CoV-2 viral copies this assay can detect is 138 copies/mL. A negative result does not preclude SARS-Cov-2 infection and should not be used as the sole basis for treatment or other patient management decisions. A negative result may occur with  improper specimen collection/handling, submission of specimen other than nasopharyngeal swab, presence of viral mutation(s) within the areas targeted by this assay, and inadequate number of viral copies(<138 copies/mL). A negative result must be combined with clinical observations, patient history, and epidemiological information. The expected result is Negative.  Fact Sheet for Patients:  EntrepreneurPulse.com.au  Fact Sheet for Healthcare Providers:  IncredibleEmployment.be  This test is no t yet approved or cleared by the [place] FDA and  has been authorized for detection and/or diagnosis of SARS-CoV-2 by FDA under an Emergency Use Authorization (EUA). This EUA will remain  in effect (meaning this test can be used) for the duration of the COVID-19 declaration under Section 564(b)(1) of the Act, 21 U.S.C.section 360bbb-3(b)(1), unless the authorization is terminated  or revoked sooner.       Influenza A by PCR NEGATIVE NEGATIVE Final   Influenza B by PCR NEGATIVE NEGATIVE Final     Comment: (NOTE) The Xpert Xpress SARS-CoV-2/FLU/RSV plus assay is intended as an aid in the diagnosis of influenza from Nasopharyngeal swab specimens and should not be used as a sole basis for treatment. Nasal washings and aspirates are unacceptable for Xpert Xpress SARS-CoV-2/FLU/RSV testing.  Fact Sheet for Patients: EntrepreneurPulse.com.au  Fact Sheet for Healthcare Providers: IncredibleEmployment.be  This test is not yet approved or cleared by the [place] FDA and has been authorized for detection and/or diagnosis of SARS-CoV-2 by FDA under an Emergency Use Authorization (EUA). This EUA will remain in effect (meaning this test can be used) for the duration of the COVID-19 declaration under Section 564(b)(1) of the Act, 21 U.S.C. section 360bbb-3(b)(1), unless the authorization is terminated or revoked.  Performed at [facility], [place] [address] [place], [place] [address]     RADIOLOGY STUDIES/RESULTS: No results found.   LOS: 4 days   Oren Binet, MD  [facility]    To contact the attending provider between 7A-7P or the covering provider during after hours 7P-7A, please log into the web site [email or web address] and access using universal [place] password for that web site. If you do not have the password, please call the hospital operator.  11/25/2021, 10:25 AM

## 2021-11-25 NOTE — NC FL2 (Signed)
[place] LEVEL OF CARE SCREENING TOOL     IDENTIFICATION  Patient Name: Kelsey Hanna Birthdate: 1950/04/23 Sex: female Admission Date (Current Location): 11/20/2021  [facility] and [place] Number:  Engineering geologist and Address:  [facility], [place] [address], [place], [place] [address]      Provider Number: [id number]  Attending Physician Name and Address:  Jonetta Osgood, MD  Relative Name and Phone Number:       Current Level of Care: Hospital Recommended Level of Care: [place] Prior Approval Number:    Date Approved/Denied:   PASRR Number: [id number]  Discharge Plan: SNF    Current Diagnoses: Patient Active Problem List   Diagnosis Date Noted   Acute metabolic encephalopathy 78/67/6720   Chronic respiratory failure with hypoxia ([place]) 11/20/2021   Severe hypothyroidism 11/13/2019   AMS (altered mental status) 11/12/2019   Memory loss 07/14/2017   Weakness 06/11/2017   Chronic pain 06/11/2017   Sepsis secondary to UTI ([place]) 05/03/2017   MRSA bacteremia    Brain aneurysm 03/13/2017   Internal carotid aneurysm    Slurred speech 03/12/2017   Hypotension 03/12/2017   Dysarthria    Left sided numbness    Acute left-sided muscle weakness 01/24/2017   Stroke (cerebrum) ([place]) 01/24/2017   Intracranial aneurysm 01/24/2017   Encounter for medication review 02/07/2016   Hypoxia, sleep related 09/07/2015   Constipation 07/30/2015   Dizziness 07/30/2015   Chronic diastolic CHF (congestive heart failure) ([place]) 06/26/2015   Bilateral lower extremity edema 06/26/2015   Mitral regurgitation 05/17/2015   Accidental drug overdose 05/17/2015   Respiratory failure with hypoxia and hypercapnia ([place]) 05/09/2015   OSA (obstructive sleep apnea) 05/09/2015   Overdose 05/09/2015   Chronic back pain 03/20/2015   Right shoulder pain 11/14/2014   Insomnia 11/14/2014   Psychoses ([place])    Bacteremia    Pedal edema  09/12/2014   Bipolar disorder, current episode manic without psychotic features, severe ([place])    Bipolar disorder ([place]) 09/05/2014   Bipolar affective disorder, current episode manic with psychotic symptoms ([place]) 09/05/2014   Bipolar affective disorder, manic ([place]) 09/03/2014   Encounter for preadmission testing    Head revolving around 07/27/2014   BP (high blood pressure) 05/23/2014   AF (paroxysmal atrial fibrillation) ([place]) 05/23/2014   Type 2 diabetes mellitus ([place]) 04/27/2014   Essential hypertension 04/27/2014   Hypothyroidism 04/27/2014   Tobacco abuse 04/27/2014   Bipolar I disorder ([place]) 04/20/2014   Acute respiratory failure with hypoxia ([place])    Encounter for feeding tube placement    Shortness of breath    Gout 08/25/2013   Legal blindness [place] 09/13/2009   HLD (hyperlipidemia) 08/30/2009    Orientation RESPIRATION BLADDER Height & Weight     Self  Normal Incontinent, External catheter Weight: 172 lb (78 kg) Height:  '5\' 3"'$  (160 cm)  BEHAVIORAL SYMPTOMS/MOOD NEUROLOGICAL BOWEL NUTRITION STATUS      Continent Diet (See dc summary)  AMBULATORY STATUS COMMUNICATION OF NEEDS Skin   Extensive Assist Verbally Other (Comment) (wound on lower back)                       Personal Care Assistance Level of Assistance  Bathing, Feeding, Dressing Bathing Assistance: Maximum assistance Feeding assistance: Maximum assistance Dressing Assistance: Maximum assistance     Functional Limitations Info  Sight Sight Info: Impaired        SPECIAL CARE FACTORS FREQUENCY  Contractures Contractures Info: Not present    Additional Factors Info  Code Status, Allergies, Isolation Precautions Code Status Info: DNR Allergies Info: Lithobid (Lithium), Naprosyn (Naproxen)     Isolation Precautions Info: VRE     Current Medications (11/25/2021):  This is the current hospital active medication list Current Facility-Administered Medications   Medication Dose Route Frequency Provider Last Rate Last Admin   acetaminophen (TYLENOL) tablet 650 mg  650 mg Oral Q6H PRN Dessa Phi, DO       Or   acetaminophen (TYLENOL) suppository 650 mg  650 mg Rectal Q6H PRN Dessa Phi, DO       atorvastatin (LIPITOR) tablet 20 mg  20 mg Oral Daily Dessa Phi, DO   20 mg at 11/25/21 0802   entacapone (COMTAN) tablet 200 mg  200 mg Oral TID Ardyth Harps B, RPH   200 mg at 11/25/21 8588   And   carbidopa-levodopa (SINEMET CR) 50-200 MG per tablet controlled release 1 tablet  1 tablet Oral TID Ardyth Harps B, RPH   1 tablet at 11/25/21 0802   cefTRIAXone (ROCEPHIN) 2 g in sodium chloride 0.9 % 100 mL IVPB  2 g Intravenous Q24H Dessa Phi, DO 200 mL/hr at 11/24/21 1400 2 g at 11/24/21 1400   divalproex (DEPAKOTE SPRINKLE) capsule 500 mg  500 mg Oral BID Elgergawy, Silver Huguenin, MD   500 mg at 11/25/21 0803   enoxaparin (LOVENOX) injection 40 mg  40 mg Subcutaneous Daily Elgergawy, Silver Huguenin, MD   40 mg at 11/25/21 0803   HYDROcodone-acetaminophen (NORCO/VICODIN) 5-325 MG per tablet 1-2 tablet  1-2 tablet Oral Q4H PRN Dessa Phi, DO       levothyroxine (SYNTHROID) tablet 100 mcg  100 mcg Oral Q0600 Dessa Phi, DO   100 mcg at 11/25/21 0802   methocarbamol (ROBAXIN) tablet 500 mg  500 mg Oral Q8H PRN Jonetta Osgood, MD       ondansetron [facility]) tablet 4 mg  4 mg Oral Q6H PRN Dessa Phi, DO       Or   ondansetron [facility]) injection 4 mg  4 mg Intravenous Q6H PRN Dessa Phi, DO       sertraline (ZOLOFT) tablet 50 mg  50 mg Oral Daily Dessa Phi, DO   50 mg at 11/25/21 5027   sodium chloride flush (NS) 0.9 % injection 3 mL  3 mL Intravenous Q12H Dessa Phi, DO   3 mL at 11/25/21 0805     Discharge Medications: Please see discharge summary for a list of discharge medications.  Relevant Imaging Results:  Relevant Lab Results:   Additional Information SS#: [id number]  Benard Halsted, LCSW

## 2021-11-25 NOTE — Progress Notes (Signed)
   11/25/21 1259  Assess: MEWS Score  Temp 99.3 F (37.4 C)  BP 93/72  MAP (mmHg) 80  Pulse Rate (!) 109  SpO2 90 %  O2 Device Room Air  Assess: MEWS Score  MEWS Temp 0  MEWS Systolic 1  MEWS Pulse 1  MEWS RR 0  MEWS LOC 1  MEWS Score 3  MEWS Score Color Yellow  Assess: if the MEWS score is Yellow or Red  Were vital signs taken at a resting state? Yes  Focused Assessment No change from prior assessment  Does the patient meet 2 or more of the SIRS criteria? Yes  Does the patient have a confirmed or suspected source of infection? Yes  Provider and Rapid Response Notified? Yes (MD S. Ghimire)  Treat  Pain Scale Faces  Faces Pain Scale 4  Take Vital Signs  Increase Vital Sign Frequency  Yellow: Q 2hr X 2 then Q 4hr X 2, if remains yellow, continue Q 4hrs  Escalate  MEWS: Escalate Yellow: discuss with charge nurse/RN and consider discussing with provider and RRT  Notify: Charge Nurse/RN  Name of Charge Nurse/RN Notified [place]  Date Charge Nurse/RN Notified 11/25/21  Time Charge Nurse/RN Notified 1440  Notify: Provider  Provider Name/Title MD S. Ghimire  Date Provider Notified 11/25/21  Time Provider Notified 1440  Method of Notification Page  Notification Reason Other (Comment) (low bp)  Provider response No new orders  Document  Progress note created (see row info) Yes  Assess: SIRS CRITERIA  SIRS Temperature  0  SIRS Pulse 1  SIRS Respirations  0  SIRS WBC 1  SIRS Score Sum  2

## 2021-11-26 DIAGNOSIS — J9611 Chronic respiratory failure with hypoxia: Secondary | ICD-10-CM | POA: Diagnosis not present

## 2021-11-26 DIAGNOSIS — F319 Bipolar disorder, unspecified: Secondary | ICD-10-CM | POA: Diagnosis not present

## 2021-11-26 DIAGNOSIS — G9341 Metabolic encephalopathy: Secondary | ICD-10-CM | POA: Diagnosis not present

## 2021-11-26 DIAGNOSIS — N3000 Acute cystitis without hematuria: Secondary | ICD-10-CM | POA: Diagnosis not present

## 2021-11-26 NOTE — Progress Notes (Signed)
Speech Language Pathology Treatment: Dysphagia  Patient Details Name: Kelsey Hanna MRN: [id number] DOB: 19-Dec-1950 Today's Date: 11/26/2021 Time: 0935-0950 SLP Time Calculation (min) (ACUTE ONLY): 15 min  Assessment / Plan / Recommendation Clinical Impression  Patient seen for f/u diet tolerance assessment after necessitating diet downgrade during treatment yesterday. Patient alert, gaze preference to the right which RN reports is not new, confused with intermittent incoherent speech. She was agreeable to po intake however requires total assist with feeding due to cognitive deficits. Able to consume both pureed and dysphagia 3 solids with significantly prolonged mastication time but eventual oral clearance of bolus, improved slightly with verbal  and visual cueing (presentation of next bite). No overt s/s of aspiration observed with solids or sips of thin liquid via straw. Following 3-4 bites of solids, patient politely declined additional po intake. Current diet remains appropriate given mastication time/decreased attention to task in order to maximize po intake. Recommend crushing pills as able to increase safety with intake given oral deficits and decreased sustained attention. Prognosis for ability to advance diet good with improved mentation. SLP will continue to f/u.    HPI HPI: Pt is a 71 y.o. female who presented at ED from her skilled nursing facility on 10/4.  Per report, pt found unresponsive in facility. EMS noted her to be gazing to the left, was nonverbal and non participatory. She was sent to ED as code stroke. Per MD note 10/5 "MRI and EEG negative for acute process. Likely toxic metabolic encephalopathy from UTI in the setting of pre-existing underlying neurodegenerative process". Failed yale swallow screen x2, once due to coughing and once due to lethargy. PMH: vascular dementia, hypothyroidism, depression, bipolar disorder, dyslipidemia, chronic hypoxemic respiratory failure on 2 L  oxygen, Parkinson's disease; ST recommended D3/thin at BSE completion; ST f/u for diet tolerance         Recommendations for follow up therapy are one component of a multi-disciplinary discharge planning process, led by the attending physician.  Recommendations may be updated based on patient status, additional functional criteria and insurance authorization.    Recommendations  Diet recommendations: Dysphagia 2 (fine chop);Thin liquid Liquids provided via: Straw Medication Administration: Crushed with puree Supervision: Full supervision/cueing for compensatory strategies;Trained caregiver to feed patient Compensations: Slow rate;Small sips/bites;Minimize environmental distractions;Lingual sweep for clearance of pocketing Postural Changes and/or Swallow Maneuvers: Seated upright 90 degrees                Oral Care Recommendations: Oral care BID;Staff/trained caregiver to provide oral care Follow Up Recommendations: Skilled nursing-short term rehab (<3 hours/day) Assistance recommended at discharge: Frequent or constant Supervision/Assistance SLP Visit Diagnosis: Dysphagia, oropharyngeal phase (R13.12)         Kelsey Stopher MA, CCC-SLP  Kelsey Hanna Kelsey Hanna  11/26/2021, 9:59 AM

## 2021-11-26 NOTE — Progress Notes (Signed)
Phlebotomy unable to obtain AM labs. 2 phlebotomists made several unsuccessful attempts. Message sent to on call provider.

## 2021-11-26 NOTE — Progress Notes (Signed)
PROGRESS NOTE        PATIENT DETAILS Name: Kelsey Hanna Age: 71 y.o. Sex: female Date of Birth: 1950-05-12 Admit Date: 11/20/2021 Admitting Physician Dessa Phi, DO PCP:Pcp, No  Brief Summary: Patient is a 71 y.o.  female with history of dementia, Parkinson's disease, chronic hypoxic respiratory failure on 2 L of oxygen, bipolar disorder-who presented to the ED with acute metabolic encephalopathy in the setting of complicated UTI.  Significant events: 10/4>> admit to [facility]-severe encephalopathy in the setting of complicated UTI.  Significant studies: 10/4>>CXR: bibasilar infiltrates left> right 10/4>> CT head: No acute intracranial abnormality. 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 10/4>> MRI brain: No acute CVA. 10/4>> EEG: No seizures.  Significant microbiology data: 10/4>> urine culture: VRE, Proteus mirabilis 10/4>> blood culture: No growth 10/4>> COVID/influenza PCR: Negative  Procedures: None  Consults: None  Subjective: Seems more awake than yesterday.  Per nursing staff Speaks in 1-2 words Spoke with daughter over the phone-not yet at baseline.  Objective: Vitals: Blood pressure 121/63, pulse 71, temperature 97.7 F (36.5 C), temperature source Axillary, resp. rate 18, height '5\' 3"'$  (1.6 m), weight 78 kg, SpO2 96 %.   Exam: Gen Exam:Speaking only in 1-2 words-still somewhat confused. HEENT:atraumatic, normocephalic Chest: B/L clear to auscultation anteriorly CVS:S1S2 regular Abdomen:soft non tender, non distended Extremities:no edema Neurology: Has generalized weakness but nonfocal. Skin: no rash   Pertinent Labs/Radiology:    Latest Ref Rng & Units 11/25/2021    1:38 AM 11/23/2021    9:59 AM 11/21/2021    3:58 AM  CBC  WBC 4.0 - 10.5 K/uL 7.7  6.9  6.6   Hemoglobin 12.0 - 15.0 g/dL  8.1  9.6  9.2   Hematocrit 36.0 - 46.0 % 24.3  29.2  29.0   Platelets 150 - 400 K/uL 321  299  273     Lab Results  Component Value Date   NA 135 11/25/2021   K 4.4 11/25/2021   CL 103 11/25/2021   CO2 23 11/25/2021     Assessment/Plan: Acute metabolic encephalopathy Probably 2/2 UTI-superimposed on dementia Neuroimaging negative for CVA, EEG negative for seizures. Although more awake/alert-only speaking in 1-2 word sentences-spoke with daughter Kelsey Hanna over the phone-not yet at baseline. Continue to treat underlying UTI-we will give it a few more days-if her speech/mentation does not improve-May need to discuss further with neurology. Continue to maintain delirium precautions Family aware that as long as she is hospitalized she will continue to have some amount of delirium.  Sepsis due to complicated UTI (VRE/Proteus) Sepsis physiology resolved Continue Rocephin/amoxicillin  Parkinson's disease Continue Sinemet/Comtan  History of vascular dementia Bipolar disorder Maintain delirium precautions Continue Depakote/Zoloft  Cervical dystonia Seems to be somewhat better-poor historian Continue Robaxin Mobilize with PT/OT  Chronic hypoxic respiratory failure On 2 L of oxygen at baseline  Hypothyroidism Continue Synthroid-dosage has been adjusted-repeat TSH in 4-6 weeks  HLD Statin  Peripheral neuropathy Resume Neurontin  FOBT positive at SNF Outpatient GI referral apparently has been set up a prior notes Interestingly-FOBT negative here Hb trending down-follow periodically.   Obesity: Estimated body mass index is 30.47 kg/m as calculated from the following:   Height as of this encounter: '5\' 3"'$  (1.6 m).  Weight as of this encounter: 78 kg.   Code status:   Code Status: DNR   DVT Prophylaxis: enoxaparin (LOVENOX) injection 40 mg Start: 11/24/21 1000 SCDs Start: 11/20/21 2235    Family Communication:  Daughter-Kelsey Hanna-[phone]-updated on  10/10   Disposition Plan: Status is: Inpatient Remains inpatient appropriate because: Resolving encephalopathy-not yet at baseline.   Planned Discharge Destination:Skilled nursing facility   Diet: Diet Order             DIET DYS 2 Room service appropriate? No; Fluid consistency: Thin  Diet effective now                     Antimicrobial agents: Anti-infectives (From admission, onward)    Start     Dose/Rate Route Frequency Ordered Stop   11/25/21 1215  amoxicillin (AMOXIL) capsule 500 mg        500 mg Oral Every 8 hours 11/25/21 1118     11/20/21 1930  azithromycin (ZITHROMAX) 500 mg in sodium chloride 0.9 % 250 mL IVPB        500 mg 250 mL/hr over 60 Minutes Intravenous  Once 11/20/21 1926 11/20/21 2239   11/20/21 1430  cefTRIAXone (ROCEPHIN) 2 g in sodium chloride 0.9 % 100 mL IVPB        2 g 200 mL/hr over 30 Minutes Intravenous Every 24 hours 11/20/21 1423 11/27/21 1429        MEDICATIONS: Scheduled Meds:  amoxicillin  500 mg Oral Q8H   atorvastatin  20 mg Oral Daily   entacapone  200 mg Oral TID   And   carbidopa-levodopa  1 tablet Oral TID   divalproex  500 mg Oral BID   enoxaparin (LOVENOX) injection  40 mg Subcutaneous Daily   gabapentin  100 mg Oral BID   And   gabapentin  200 mg Oral QHS   levothyroxine  100 mcg Oral Q0600   linaclotide  145 mcg Oral QAC breakfast   sertraline  50 mg Oral Daily   sodium chloride flush  3 mL Intravenous Q12H   Continuous Infusions:  cefTRIAXone (ROCEPHIN)  IV 2 g (11/25/21 1323)   PRN Meds:.acetaminophen **OR** acetaminophen, HYDROcodone-acetaminophen, methocarbamol, ondansetron **OR** ondansetron (ZOFRAN) IV   I have personally reviewed following labs and imaging studies  LABORATORY DATA: CBC: Recent Labs  Lab 11/20/21 1255 11/20/21 1259 11/21/21 0358 11/23/21 0959 11/25/21 0138  WBC 10.5  --  6.6 6.9 7.7  NEUTROABS 9.3*  --   --   --   --   HGB 10.3* 10.5* 9.2* 9.6* 8.1*  HCT 32.3* 31.0* 29.0*  29.2* 24.3*  MCV 97.6  --  99.0 94.8 92.4  PLT 315  --  273 299 321     Basic Metabolic Panel: Recent Labs  Lab 11/20/21 1255 11/20/21 1259 11/21/21 0358 11/23/21 0959 11/24/21 0508 11/25/21 0138  NA 141 138 144 138 137 135  K 4.1 4.1 3.8 3.4* 2.9* 4.4  CL 103 103 105 102 99 103  CO2 24  --  '26 26 26 23  '$ GLUCOSE 117* 107* 77 107* 87 91  BUN '16 19 17 10 10 8  '$ CREATININE 0.68 0.50 0.34* 0.43* 0.50 0.47  CALCIUM 9.2  --  9.0 8.8* 9.2 8.4*  MG  --   --   --   --  1.3* 2.1  PHOS  --   --   --   --  2.8 2.5     GFR: Estimated Creatinine Clearance: 62.8 mL/min (  by C-G formula based on SCr of 0.47 mg/dL).  Liver Function Tests: Recent Labs  Lab 11/20/21 1255  AST 35  ALT 6  ALKPHOS 72  BILITOT 1.2  PROT 5.8*  ALBUMIN 2.7*    No results for input(s): "LIPASE", "AMYLASE" in the last 168 hours. No results for input(s): "AMMONIA" in the last 168 hours.  Coagulation Profile: Recent Labs  Lab 11/20/21 1255  INR 1.1     Cardiac Enzymes: No results for input(s): "CKTOTAL", "CKMB", "CKMBINDEX", "TROPONINI" in the last 168 hours.  BNP (last 3 results) No results for input(s): "PROBNP" in the last 8760 hours.  Lipid Profile: No results for input(s): "CHOL", "HDL", "LDLCALC", "TRIG", "CHOLHDL", "LDLDIRECT" in the last 72 hours.  Thyroid Function Tests: No results for input(s): "TSH", "T4TOTAL", "FREET4", "T3FREE", "THYROIDAB" in the last 72 hours.  Anemia Panel: No results for input(s): "VITAMINB12", "FOLATE", "FERRITIN", "TIBC", "IRON", "RETICCTPCT" in the last 72 hours.  Urine analysis:    Component Value Date/Time   COLORURINE AMBER (A) 11/20/2021 1330   APPEARANCEUR CLOUDY (A) 11/20/2021 1330   LABSPEC >1.046 (H) 11/20/2021 1330   PHURINE 7.0 11/20/2021 1330   GLUCOSEU NEGATIVE 11/20/2021 1330   HGBUR NEGATIVE 11/20/2021 1330   BILIRUBINUR SMALL (A) 11/20/2021 1330   KETONESUR 20 (A) 11/20/2021 1330   PROTEINUR 100 (A) 11/20/2021 1330   UROBILINOGEN  0.2 10/20/2014 0814   NITRITE NEGATIVE 11/20/2021 1330   LEUKOCYTESUR MODERATE (A) 11/20/2021 1330    Sepsis Labs: Lactic Acid, Venous    Component Value Date/Time   LATICACIDVEN 1.4 11/20/2021 1316    MICROBIOLOGY: Recent Results (from the past 240 hour(s))  Blood culture (routine x 2)     Status: None   Collection Time: 11/20/21  1:16 PM   Specimen: BLOOD LEFT FOREARM  Result Value Ref Range Status   Specimen Description BLOOD LEFT FOREARM  Final   Special Requests   Final    BOTTLES DRAWN AEROBIC AND ANAEROBIC Blood Culture adequate volume   Culture   Final    NO GROWTH 5 DAYS Performed at [facility], [place] [address] [place], [place] [address]    Report Status 11/25/2021 FINAL  Final  Urine Culture     Status: Abnormal   Collection Time: 11/20/21  1:16 PM   Specimen: In/Out Cath Urine  Result Value Ref Range Status   Specimen Description IN/OUT CATH URINE  Final   Special Requests   Final    NONE Performed at [facility], [place] [address], [place], [place] [address]    Culture (A)  Final    >=100,000 COLONIES/mL PROTEUS MIRABILIS >=100,000 COLONIES/mL ENTEROCOCCUS FAECALIS VANCOMYCIN RESISTANT ENTEROCOCCUS ISOLATED    Report Status 11/25/2021 FINAL  Final   Organism ID, Bacteria PROTEUS MIRABILIS (A)  Final   Organism ID, Bacteria ENTEROCOCCUS FAECALIS (A)  Final      Susceptibility   Enterococcus faecalis - MIC*    AMPICILLIN <=2 SENSITIVE Sensitive     NITROFURANTOIN <=16 SENSITIVE Sensitive     VANCOMYCIN >=32 RESISTANT Resistant     LINEZOLID 2 SENSITIVE Sensitive     * >=100,000 COLONIES/mL ENTEROCOCCUS FAECALIS   Proteus mirabilis - MIC*    AMPICILLIN >=32 RESISTANT Resistant     CEFAZOLIN 8 SENSITIVE Sensitive     CEFEPIME <=0.12 SENSITIVE Sensitive     CEFTRIAXONE <=0.25 SENSITIVE Sensitive     CIPROFLOXACIN <=0.25 SENSITIVE Sensitive     GENTAMICIN <=1 SENSITIVE Sensitive     IMIPENEM 2  SENSITIVE Sensitive     NITROFURANTOIN 128  RESISTANT Resistant     TRIMETH/SULFA >=320 RESISTANT Resistant     AMPICILLIN/SULBACTAM >=32 RESISTANT Resistant     PIP/TAZO <=4 SENSITIVE Sensitive     * >=100,000 COLONIES/mL PROTEUS MIRABILIS  Blood culture (routine x 2)     Status: None   Collection Time: 11/20/21  1:21 PM   Specimen: BLOOD RIGHT FOREARM  Result Value Ref Range Status   Specimen Description BLOOD RIGHT FOREARM  Final   Special Requests   Final    BOTTLES DRAWN AEROBIC AND ANAEROBIC Blood Culture adequate volume   Culture   Final    NO GROWTH 5 DAYS Performed at [facility], [place] [address] [place], [place] [address]    Report Status 11/25/2021 FINAL  Final  Resp Panel by RT-PCR (Flu A&B, Covid) Anterior Nasal Swab     Status: None   Collection Time: 11/20/21  3:22 PM   Specimen: Anterior Nasal Swab  Result Value Ref Range Status   SARS Coronavirus 2 by RT PCR NEGATIVE NEGATIVE Final    Comment: (NOTE) SARS-CoV-2 target nucleic acids are NOT DETECTED.  The SARS-CoV-2 RNA is generally detectable in upper respiratory specimens during the acute phase of infection. The lowest concentration of SARS-CoV-2 viral copies this assay can detect is 138 copies/mL. A negative result does not preclude SARS-Cov-2 infection and should not be used as the sole basis for treatment or other patient management decisions. A negative result may occur with  improper specimen collection/handling, submission of specimen other than nasopharyngeal swab, presence of viral mutation(s) within the areas targeted by this assay, and inadequate number of viral copies(<138 copies/mL). A negative result must be combined with clinical observations, patient history, and epidemiological information. The expected result is Negative.  Fact Sheet for Patients:  EntrepreneurPulse.com.au  Fact Sheet for Healthcare Providers:  IncredibleEmployment.be  This test is no t yet approved or cleared by the [place] FDA and  has been authorized for detection and/or diagnosis of SARS-CoV-2 by FDA under an Emergency Use Authorization (EUA). This EUA will remain  in effect (meaning this test can be used) for the duration of the COVID-19 declaration under Section 564(b)(1) of the Act, 21 U.S.C.section 360bbb-3(b)(1), unless the authorization is terminated  or revoked sooner.       Influenza A by PCR NEGATIVE NEGATIVE Final   Influenza B by PCR NEGATIVE NEGATIVE Final    Comment: (NOTE) The Xpert Xpress SARS-CoV-2/FLU/RSV plus assay is intended as an aid in the diagnosis of influenza from Nasopharyngeal swab specimens and should not be used as a sole basis for treatment. Nasal washings and aspirates are unacceptable for Xpert Xpress SARS-CoV-2/FLU/RSV testing.  Fact Sheet for Patients: EntrepreneurPulse.com.au  Fact Sheet for Healthcare Providers: IncredibleEmployment.be  This test is not yet approved or cleared by the [place] FDA and has been authorized for detection and/or diagnosis of SARS-CoV-2 by FDA under an Emergency Use Authorization (EUA). This EUA will remain in effect (meaning this test can be used) for the duration of the COVID-19 declaration under Section 564(b)(1) of the Act, 21 U.S.C. section 360bbb-3(b)(1), unless the authorization is terminated or revoked.  Performed at [facility], [place] [address] [place] [employer], [place] [address]     RADIOLOGY STUDIES/RESULTS: No results found.   LOS: 5 days   Oren Binet, MD  [facility]    To contact the attending provider between 7A-7P or the covering provider during after hours 7P-7A,  please log into the web site [email or web address] and access using universal [place] password for that web site. If you do not have the password, please call the hospital operator.  11/26/2021, 11:16 AM

## 2021-11-27 DIAGNOSIS — G9341 Metabolic encephalopathy: Secondary | ICD-10-CM | POA: Diagnosis not present

## 2021-11-27 DIAGNOSIS — F319 Bipolar disorder, unspecified: Secondary | ICD-10-CM | POA: Diagnosis not present

## 2021-11-27 DIAGNOSIS — N3000 Acute cystitis without hematuria: Secondary | ICD-10-CM | POA: Diagnosis not present

## 2021-11-27 DIAGNOSIS — J9611 Chronic respiratory failure with hypoxia: Secondary | ICD-10-CM | POA: Diagnosis not present

## 2021-11-27 LAB — CBC
HCT: 26.8 % — ABNORMAL LOW (ref 36.0–46.0)
Hemoglobin: 8.5 g/dL — ABNORMAL LOW (ref 12.0–15.0)
MCH: 30.4 pg (ref 26.0–34.0)
MCHC: 31.7 g/dL (ref 30.0–36.0)
MCV: 95.7 fL (ref 80.0–100.0)
Platelets: 308 10*3/uL (ref 150–400)
RBC: 2.8 MIL/uL — ABNORMAL LOW (ref 3.87–5.11)
RDW: 19.7 % — ABNORMAL HIGH (ref 11.5–15.5)
WBC: 8.6 10*3/uL (ref 4.0–10.5)
nRBC: 0 % (ref 0.0–0.2)

## 2021-11-27 NOTE — Progress Notes (Signed)
Neurology Progress Note   S:// Kelsey Hanna is a 71 y.o. female being brought in from a nursing facility with information obtained from EMS, history of bipolar disorder anxiety depression dyslipidemia documented history of PNES, vascular dementia, Parkinson's for which she is on Sinemet, chronic systolic heart failure, hypothyroidism, chronic respiratory failure with hypoxia on home oxygen 2 L each day, and polyneuropathy who presented for evaluation of sudden onset of unresponsiveness 11/20/21 with left gaze preference , nonverbal and nonparticipatory in the exam.  LVO screen was positive and they brought her as a code stroke.  She was weak in all 4 extremities and did not move any of the extremities but had tremors on the left arm and was not able to provide history.  Neurology had seen this patient this admission on this date for stroke code and recommended EEG and MRI brain, both of which were unremarkable for anything acute. CTA head and neck also negative for LVO, but patient has known right cavernous ICA stent and known saccular aneurysm of left ICA. Overall impression is for complicated UTI causing encephalopathy.   Neurology is asked to re-evaluate patient for altered mental status.   O:// Current vital signs: BP (!) 93/57 (BP Location: Right Arm)   Pulse 71   Temp (!) 97.3 F (36.3 C) (Axillary)   Resp 17   Ht '5\' 3"'$  (1.6 m)   Wt 78 kg   SpO2 100%   BMI 30.47 kg/m  Vital signs in last 24 hours: Temp:  [97.1 F (36.2 C)-97.9 F (36.6 C)] 97.3 F (36.3 C) (10/11 1200) Pulse Rate:  [66-74] 71 (10/11 1200) Resp:  [16-18] 17 (10/11 1200) BP: (93-148)/(42-132) 93/57 (10/11 1200) SpO2:  [96 %-100 %] 100 % (10/11 0903) GENERAL: sleeping, takes multiple verbal attempts to arouse, general edema both UE and LES BL  HEENT: - Normocephalic and atraumatic, dry mm, no LN++ LUNGS - defer to primary team  CV - defer to primary team  ABDOMEN - Soft, nontender, nondistended with  normoactive BS Ext: warm, well perfused, intact peripheral pulses NEURO:  Mental Status/language: when Pryor Curia states her name she clearly says "yes" but no other utilizable speech noted throughout examination  Cranial Nerves: PERRL. EOMI, visual fields blink to threat in tact, no facial asymmetry Motor: +notable left UE tremor at rest, improves somewhat with light touch stimulation. Intermittently rpesent in legs as well, left greater than right. Does not follow commands for power testing but BLUEs at least 4/5 strength with passive movement of author, arm does not hit bed when placed over head.  Tone: is normal and bulk is normal  Medications  Current Facility-Administered Medications:    acetaminophen (TYLENOL) tablet 650 mg, 650 mg, Oral, Q6H PRN, 650 mg at 11/25/21 1259 **OR** acetaminophen (TYLENOL) suppository 650 mg, 650 mg, Rectal, Q6H PRN, Dessa Phi, DO   amoxicillin (AMOXIL) capsule 500 mg, 500 mg, Oral, Q8H, Ghimire, Shanker M, MD, 500 mg at 11/27/21 0547   atorvastatin (LIPITOR) tablet 20 mg, 20 mg, Oral, Daily, Dessa Phi, DO, 20 mg at 11/27/21 8250   entacapone (COMTAN) tablet 200 mg, 200 mg, Oral, TID, 200 mg at 11/27/21 1206 **AND** carbidopa-levodopa (SINEMET CR) 50-200 MG per tablet controlled release 1 tablet, 1 tablet, Oral, TID, Ardyth Harps B, RPH, 1 tablet at 11/27/21 1206   divalproex (DEPAKOTE SPRINKLE) capsule 500 mg, 500 mg, Oral, BID, Elgergawy, Silver Huguenin, MD, 500 mg at 11/27/21 0907   enoxaparin (LOVENOX) injection 40 mg, 40 mg, Subcutaneous, Daily, Elgergawy,  Silver Huguenin, MD, 40 mg at 11/27/21 0906   gabapentin (NEURONTIN) capsule 100 mg, 100 mg, Oral, BID, 100 mg at 11/27/21 0906 **AND** gabapentin (NEURONTIN) capsule 200 mg, 200 mg, Oral, QHS, Ghimire, Henreitta Leber, MD, 200 mg at 11/26/21 2217   HYDROcodone-acetaminophen (NORCO/VICODIN) 5-325 MG per tablet 1 tablet, 1 tablet, Oral, Q6H PRN, Jonetta Osgood, MD   levothyroxine (SYNTHROID) tablet 100 mcg,  100 mcg, Oral, Q0600, Dessa Phi, DO, 100 mcg at 11/27/21 0547   linaclotide (LINZESS) capsule 145 mcg, 145 mcg, Oral, QAC breakfast, Ghimire, Henreitta Leber, MD, 145 mcg at 11/27/21 0906   methocarbamol (ROBAXIN) tablet 500 mg, 500 mg, Oral, Q8H PRN, Jonetta Osgood, MD, 500 mg at 11/26/21 1852   ondansetron (ZOFRAN) tablet 4 mg, 4 mg, Oral, Q6H PRN **OR** ondansetron (ZOFRAN) injection 4 mg, 4 mg, Intravenous, Q6H PRN, Dessa Phi, DO   sertraline (ZOLOFT) tablet 50 mg, 50 mg, Oral, Daily, Dessa Phi, DO, 50 mg at 11/27/21 0906   sodium chloride flush (NS) 0.9 % injection 3 mL, 3 mL, Intravenous, Q12H, Dessa Phi, DO, 3 mL at 11/27/21 0929 Labs CBC    Component Value Date/Time   WBC 8.6 11/27/2021 0016   RBC 2.80 (L) 11/27/2021 0016   HGB 8.5 (L) 11/27/2021 0016   HCT 26.8 (L) 11/27/2021 0016   PLT 308 11/27/2021 0016   MCV 95.7 11/27/2021 0016   MCH 30.4 11/27/2021 0016   MCHC 31.7 11/27/2021 0016   RDW 19.7 (H) 11/27/2021 0016   LYMPHSABS 0.6 (L) 11/20/2021 1255   MONOABS 0.5 11/20/2021 1255   EOSABS 0.0 11/20/2021 1255   BASOSABS 0.0 11/20/2021 1255    CMP     Component Value Date/Time   NA 135 11/25/2021 0138   K 4.4 11/25/2021 0138   CL 103 11/25/2021 0138   CO2 23 11/25/2021 0138   GLUCOSE 91 11/25/2021 0138   BUN 8 11/25/2021 0138   CREATININE 0.47 11/25/2021 0138   CALCIUM 8.4 (L) 11/25/2021 0138   PROT 5.8 (L) 11/20/2021 1255   ALBUMIN 2.7 (L) 11/20/2021 1255   AST 35 11/20/2021 1255   ALT 6 11/20/2021 1255   ALKPHOS 72 11/20/2021 1255   BILITOT 1.2 11/20/2021 1255   GFRNONAA >60 11/25/2021 0138   GFRAA >60 11/14/2019 0851    glycosylated hemoglobin  Lipid Panel     Component Value Date/Time   CHOL 160 03/04/2017 1112   TRIG 93.0 03/04/2017 1112   HDL 58.00 03/04/2017 1112   CHOLHDL 3 03/04/2017 1112   VLDL 18.6 03/04/2017 1112   LDLCALC 84 03/04/2017 1112   Valproic acid level within normal limits   Imaging I have reviewed images  in epic and the results pertinent to this consultation are:  CT-scan of the brain and MRI examination of the brain unremarkable for acute process, severe generalized atrophy BL  EEG unremarkable   Assessment:  With a history of bipolar disorder on valproic acid and levels within normal limits this admission, anxiety, depression, hyperlipidemia, documented history of PN ES, vascular and Parkinson's dementia on Sinemet, chronic systolic heart failure, hypothyroidism, chronic respiratory failure with hypoxia on home oxygen 2 L, and peripheral polyneuropathy maintained on gabapentin who presented for alteration in mental status and is found to have complicated UTI, hypomagnesemia, hypokalemia, and hypothyroidism.  Her neurological work-up has been unremarkable, including head CT, MRI brain, and EEG.  There is no current suspicion for seizure activity, stroke, or other primary neurological entity.  Valproic acid toxicity also ruled  out.   Impression: Hypoactive delirium in setting of metabolic disturbances, thyroid dysfunction, and infection (UTI) in setting of poor substrate with severe cerebral atrophy from vascular/Parkinson's dementia  Recommendations: -No further neurological testing is warranted at this juncture -Continue education to family appreciated: Accelerated dementia process without return to baseline as well as delayed recovery to baseline both possible in this patient's situation. -serum ammonia level could be considered but transaminases within normal limits, concomitant hepatic encephalopathy less likely and this is likely low yield -author attempted to reach patient's daughter to discuss above but was unsuccessful   Please call with any questions or changes This case was discussed and seen with Dr. Curly Shores. -- Lennie Hummer, PA-C  Neurology  Attending Neurologist's note:  I personally saw this patient, gathering history, performing a full neurologic examination, reviewing  relevant labs, personally reviewing relevant imaging including  MRI brain, and formulated the assessment and plan, adding the note above for completeness and clarity to accurately reflect my thoughts  Lesleigh Noe MD-PhD [facility] [phone] Available 7 AM to 7 PM, outside these hours please contact Neurologist on call listed on AMION   Greater than 35 min spent in care of this patient today, > 50% at bedside

## 2021-11-27 NOTE — Progress Notes (Signed)
Dr. Bridgett Larsson infored via chat of bp 83/55 and that patient refused her Depakote 500 mg tonight

## 2021-11-27 NOTE — Progress Notes (Signed)
PROGRESS NOTE        PATIENT DETAILS Name: Kelsey Hanna Age: 71 y.o. Sex: female Date of Birth: 08-07-1950 Admit Date: 11/20/2021 Admitting Physician Dessa Phi, DO PCP:Pcp, No  Brief Summary: Patient is a 71 y.o.  female with history of dementia, Parkinson's disease, chronic hypoxic respiratory failure on 2 L of oxygen, bipolar disorder-who presented to the ED with acute metabolic encephalopathy in the setting of complicated UTI.  Significant events: 10/4>> admit to [facility]-severe encephalopathy in the setting of complicated UTI.  Significant studies: 10/4>>CXR: bibasilar infiltrates left> right 10/4>> CT head: No acute intracranial abnormality. 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 10/4>> MRI brain: No acute CVA. 10/4>> EEG: No seizures.  Significant microbiology data: 10/4>> urine culture: VRE, Proteus mirabilis 10/4>> blood culture: No growth 10/4>> COVID/influenza PCR: Negative  Procedures: None  Consults: None  Subjective: More awake Daughter on the speaker phone when I was in the room-patient mostly answers in one-word sentences-yes or no.  Barely able to string up a sentence.  Objective: Vitals: Blood pressure 131/68, pulse 74, temperature (!) 97.1 F (36.2 C), temperature source Axillary, resp. rate 16, height '5\' 3"'$  (1.6 m), weight 78 kg, SpO2 100 %.   Exam: Gen Exam:not in any distress HEENT:atraumatic, normocephalic Chest: B/L clear to auscultation anteriorly CVS:S1S2 regular Abdomen:soft non tender, non distended Extremities:no edema Neurology: Non focal Skin: no rash   Pertinent Labs/Radiology:    Latest Ref Rng & Units 11/27/2021   12:16 AM 11/25/2021    1:38 AM 11/23/2021    9:59 AM  CBC  WBC 4.0 - 10.5 K/uL 8.6  7.7  6.9   Hemoglobin 12.0 - 15.0 g/dL 8.5  8.1  9.6    Hematocrit 36.0 - 46.0 % 26.8  24.3  29.2   Platelets 150 - 400 K/uL 308  321  299     Lab Results  Component Value Date   NA 135 11/25/2021   K 4.4 11/25/2021   CL 103 11/25/2021   CO2 23 11/25/2021     Assessment/Plan: Acute metabolic encephalopathy Probably 2/2 UTI-superimposed on dementia Neuroimaging negative for CVA, EEG negative for seizures. Encephalopathy overall better-more awake-but speech not yet at baseline-only speaking 1-2 word sentences.  Daughter seems to think that patient unable to express herself.   MRI brain/EEG negative.  Have reconsulted neurology. Continue to treat underlying UTI to see if encephalopathy improves.  Continue to maintain delirium precautions Family aware that as long as she is hospitalized she will continue to have some amount of delirium.  Sepsis due to complicated UTI (VRE/Proteus) Sepsis physiology resolved Continue Rocephin/amoxicillin  Normocytic anemia Due to acute illness Hb fluctuating but relatively stable Evidence of blood loss  Parkinson's disease Continue Sinemet/Comtan  History of vascular dementia Bipolar disorder Maintain delirium precautions Continue Depakote/Zoloft  Cervical dystonia Seems to be somewhat better-poor historian Continue Robaxin Mobilize with PT/OT  Chronic hypoxic respiratory failure On 2 L of oxygen at baseline  Hypothyroidism Continue Synthroid-dosage has been adjusted-repeat TSH in 4-6 weeks  HLD Statin  Peripheral neuropathy Resume Neurontin  FOBT positive at SNF Outpatient GI referral apparently has been set up a prior notes Interestingly-FOBT negative here    Obesity: Estimated body mass index is 30.47 kg/m as  calculated from the following:   Height as of this encounter: '5\' 3"'$  (1.6 m).   Weight as of this encounter: 78 kg.   Code status:   Code Status: DNR   DVT Prophylaxis: enoxaparin (LOVENOX) injection 40 mg Start: 11/24/21 1000 SCDs Start: 11/20/21 2235     Family Communication:  Daughter-[phone]-updated on 10/11   Disposition Plan: Status is: Inpatient Remains inpatient appropriate because: Resolving encephalopathy-not yet at baseline.   Planned Discharge Destination:Skilled nursing facility   Diet: Diet Order             DIET DYS 2 Room service appropriate? No; Fluid consistency: Thin  Diet effective now                     Antimicrobial agents: Anti-infectives (From admission, onward)    Start     Dose/Rate Route Frequency Ordered Stop   11/25/21 1215  amoxicillin (AMOXIL) capsule 500 mg        500 mg Oral Every 8 hours 11/25/21 1118     11/20/21 1930  azithromycin (ZITHROMAX) 500 mg in sodium chloride 0.9 % 250 mL IVPB        500 mg 250 mL/hr over 60 Minutes Intravenous  Once 11/20/21 1926 11/20/21 2239   11/20/21 1430  cefTRIAXone (ROCEPHIN) 2 g in sodium chloride 0.9 % 100 mL IVPB        2 g 200 mL/hr over 30 Minutes Intravenous Every 24 hours 11/20/21 1423 11/26/21 1616        MEDICATIONS: Scheduled Meds:  amoxicillin  500 mg Oral Q8H   atorvastatin  20 mg Oral Daily   entacapone  200 mg Oral TID   And   carbidopa-levodopa  1 tablet Oral TID   divalproex  500 mg Oral BID   enoxaparin (LOVENOX) injection  40 mg Subcutaneous Daily   gabapentin  100 mg Oral BID   And   gabapentin  200 mg Oral QHS   levothyroxine  100 mcg Oral Q0600   linaclotide  145 mcg Oral QAC breakfast   sertraline  50 mg Oral Daily   sodium chloride flush  3 mL Intravenous Q12H   Continuous Infusions:   PRN Meds:.acetaminophen **OR** acetaminophen, HYDROcodone-acetaminophen, methocarbamol, ondansetron **OR** ondansetron (ZOFRAN) IV   I have personally reviewed following labs and imaging studies  LABORATORY DATA: CBC: Recent Labs  Lab 11/20/21 1255 11/20/21 1259 11/21/21 0358 11/23/21 0959 11/25/21 0138 11/27/21 0016  WBC 10.5  --  6.6 6.9 7.7 8.6  NEUTROABS 9.3*  --   --   --   --   --   HGB 10.3*  10.5* 9.2* 9.6* 8.1* 8.5*  HCT 32.3* 31.0* 29.0* 29.2* 24.3* 26.8*  MCV 97.6  --  99.0 94.8 92.4 95.7  PLT 315  --  273 299 321 308     Basic Metabolic Panel: Recent Labs  Lab 11/20/21 1255 11/20/21 1259 11/21/21 0358 11/23/21 0959 11/24/21 0508 11/25/21 0138  NA 141 138 144 138 137 135  K 4.1 4.1 3.8 3.4* 2.9* 4.4  CL 103 103 105 102 99 103  CO2 24  --  '26 26 26 23  '$ GLUCOSE 117* 107* 77 107* 87 91  BUN '16 19 17 10 10 8  '$ CREATININE 0.68 0.50 0.34* 0.43* 0.50 0.47  CALCIUM 9.2  --  9.0 8.8* 9.2 8.4*  MG  --   --   --   --  1.3* 2.1  PHOS  --   --   --   --  2.8 2.5     GFR: Estimated Creatinine Clearance: 62.8 mL/min (by C-G formula based on SCr of 0.47 mg/dL).  Liver Function Tests: Recent Labs  Lab 11/20/21 1255  AST 35  ALT 6  ALKPHOS 72  BILITOT 1.2  PROT 5.8*  ALBUMIN 2.7*    No results for input(s): "LIPASE", "AMYLASE" in the last 168 hours. No results for input(s): "AMMONIA" in the last 168 hours.  Coagulation Profile: Recent Labs  Lab 11/20/21 1255  INR 1.1     Cardiac Enzymes: No results for input(s): "CKTOTAL", "CKMB", "CKMBINDEX", "TROPONINI" in the last 168 hours.  BNP (last 3 results) No results for input(s): "PROBNP" in the last 8760 hours.  Lipid Profile: No results for input(s): "CHOL", "HDL", "LDLCALC", "TRIG", "CHOLHDL", "LDLDIRECT" in the last 72 hours.  Thyroid Function Tests: No results for input(s): "TSH", "T4TOTAL", "FREET4", "T3FREE", "THYROIDAB" in the last 72 hours.  Anemia Panel: No results for input(s): "VITAMINB12", "FOLATE", "FERRITIN", "TIBC", "IRON", "RETICCTPCT" in the last 72 hours.  Urine analysis:    Component Value Date/Time   COLORURINE AMBER (A) 11/20/2021 1330   APPEARANCEUR CLOUDY (A) 11/20/2021 1330   LABSPEC >1.046 (H) 11/20/2021 1330   PHURINE 7.0 11/20/2021 1330   GLUCOSEU NEGATIVE 11/20/2021 1330   HGBUR NEGATIVE 11/20/2021 1330   BILIRUBINUR SMALL (A) 11/20/2021 1330   KETONESUR 20 (A)  11/20/2021 1330   PROTEINUR 100 (A) 11/20/2021 1330   UROBILINOGEN 0.2 10/20/2014 0814   NITRITE NEGATIVE 11/20/2021 1330   LEUKOCYTESUR MODERATE (A) 11/20/2021 1330    Sepsis Labs: Lactic Acid, Venous    Component Value Date/Time   LATICACIDVEN 1.4 11/20/2021 1316    MICROBIOLOGY: Recent Results (from the past 240 hour(s))  Blood culture (routine x 2)     Status: None   Collection Time: 11/20/21  1:16 PM   Specimen: BLOOD LEFT FOREARM  Result Value Ref Range Status   Specimen Description BLOOD LEFT FOREARM  Final   Special Requests   Final    BOTTLES DRAWN AEROBIC AND ANAEROBIC Blood Culture adequate volume   Culture   Final    NO GROWTH 5 DAYS Performed at [facility], [place] [address] [place], [place] [address]    Report Status 11/25/2021 FINAL  Final  Urine Culture     Status: Abnormal   Collection Time: 11/20/21  1:16 PM   Specimen: In/Out Cath Urine  Result Value Ref Range Status   Specimen Description IN/OUT CATH URINE  Final   Special Requests   Final    NONE Performed at [facility], [place] [address], [place], [place] [address]    Culture (A)  Final    >=100,000 COLONIES/mL PROTEUS MIRABILIS >=100,000 COLONIES/mL ENTEROCOCCUS FAECALIS VANCOMYCIN RESISTANT ENTEROCOCCUS ISOLATED    Report Status 11/25/2021 FINAL  Final   Organism ID, Bacteria PROTEUS MIRABILIS (A)  Final   Organism ID, Bacteria ENTEROCOCCUS FAECALIS (A)  Final      Susceptibility   Enterococcus faecalis - MIC*    AMPICILLIN <=2 SENSITIVE Sensitive     NITROFURANTOIN <=16 SENSITIVE Sensitive     VANCOMYCIN >=32 RESISTANT Resistant     LINEZOLID 2 SENSITIVE Sensitive     * >=100,000 COLONIES/mL ENTEROCOCCUS FAECALIS   Proteus mirabilis - MIC*    AMPICILLIN >=32 RESISTANT Resistant     CEFAZOLIN 8 SENSITIVE Sensitive     CEFEPIME <=0.12 SENSITIVE Sensitive     CEFTRIAXONE <=0.25 SENSITIVE Sensitive     CIPROFLOXACIN <=0.25 SENSITIVE Sensitive  GENTAMICIN <=1 SENSITIVE  Sensitive     IMIPENEM 2 SENSITIVE Sensitive     NITROFURANTOIN 128 RESISTANT Resistant     TRIMETH/SULFA >=320 RESISTANT Resistant     AMPICILLIN/SULBACTAM >=32 RESISTANT Resistant     PIP/TAZO <=4 SENSITIVE Sensitive     * >=100,000 COLONIES/mL PROTEUS MIRABILIS  Blood culture (routine x 2)     Status: None   Collection Time: 11/20/21  1:21 PM   Specimen: BLOOD RIGHT FOREARM  Result Value Ref Range Status   Specimen Description BLOOD RIGHT FOREARM  Final   Special Requests   Final    BOTTLES DRAWN AEROBIC AND ANAEROBIC Blood Culture adequate volume   Culture   Final    NO GROWTH 5 DAYS Performed at [facility], [place] [address] [place], [place] [address]    Report Status 11/25/2021 FINAL  Final  Resp Panel by RT-PCR (Flu A&B, Covid) Anterior Nasal Swab     Status: None   Collection Time: 11/20/21  3:22 PM   Specimen: Anterior Nasal Swab  Result Value Ref Range Status   SARS Coronavirus 2 by RT PCR NEGATIVE NEGATIVE Final    Comment: (NOTE) SARS-CoV-2 target nucleic acids are NOT DETECTED.  The SARS-CoV-2 RNA is generally detectable in upper respiratory specimens during the acute phase of infection. The lowest concentration of SARS-CoV-2 viral copies this assay can detect is 138 copies/mL. A negative result does not preclude SARS-Cov-2 infection and should not be used as the sole basis for treatment or other patient management decisions. A negative result may occur with  improper specimen collection/handling, submission of specimen other than nasopharyngeal swab, presence of viral mutation(s) within the areas targeted by this assay, and inadequate number of viral copies(<138 copies/mL). A negative result must be combined with clinical observations, patient history, and epidemiological information. The expected result is Negative.  Fact Sheet for Patients:  EntrepreneurPulse.com.au  Fact Sheet for Healthcare Providers:   IncredibleEmployment.be  This test is no t yet approved or cleared by the [place] FDA and  has been authorized for detection and/or diagnosis of SARS-CoV-2 by FDA under an Emergency Use Authorization (EUA). This EUA will remain  in effect (meaning this test can be used) for the duration of the COVID-19 declaration under Section 564(b)(1) of the Act, 21 U.S.C.section 360bbb-3(b)(1), unless the authorization is terminated  or revoked sooner.       Influenza A by PCR NEGATIVE NEGATIVE Final   Influenza B by PCR NEGATIVE NEGATIVE Final    Comment: (NOTE) The Xpert Xpress SARS-CoV-2/FLU/RSV plus assay is intended as an aid in the diagnosis of influenza from Nasopharyngeal swab specimens and should not be used as a sole basis for treatment. Nasal washings and aspirates are unacceptable for Xpert Xpress SARS-CoV-2/FLU/RSV testing.  Fact Sheet for Patients: EntrepreneurPulse.com.au  Fact Sheet for Healthcare Providers: IncredibleEmployment.be  This test is not yet approved or cleared by the [place] FDA and has been authorized for detection and/or diagnosis of SARS-CoV-2 by FDA under an Emergency Use Authorization (EUA). This EUA will remain in effect (meaning this test can be used) for the duration of the COVID-19 declaration under Section 564(b)(1) of the Act, 21 U.S.C. section 360bbb-3(b)(1), unless the authorization is terminated or revoked.  Performed at [facility], [place] [address], [place], [place] [address]     RADIOLOGY STUDIES/RESULTS: No results found.   LOS: 6 days   Oren Binet, MD  [facility]    To contact the attending provider  between 7A-7P or the covering provider during after hours 7P-7A, please log into the web site [email or web address] and access using universal [place] password for that web site. If you do not have the password, please call the hospital  operator.  11/27/2021, 10:34 AM

## 2021-11-27 NOTE — Progress Notes (Signed)
   11/27/21 1613  Height and Weight  Height '5\' 3"'$  (1.6 m)  Weight 81.1 kg  Type of Scale Used Bed  BSA (Calculated - sq m) 1.9 sq meters  BMI (Calculated) 31.68  Weight in (lb) to have BMI = 25 140.8

## 2021-11-28 DIAGNOSIS — F319 Bipolar disorder, unspecified: Secondary | ICD-10-CM | POA: Diagnosis not present

## 2021-11-28 DIAGNOSIS — J9611 Chronic respiratory failure with hypoxia: Secondary | ICD-10-CM | POA: Diagnosis not present

## 2021-11-28 DIAGNOSIS — G9341 Metabolic encephalopathy: Secondary | ICD-10-CM | POA: Diagnosis not present

## 2021-11-28 DIAGNOSIS — E039 Hypothyroidism, unspecified: Secondary | ICD-10-CM

## 2021-11-28 MED ORDER — SODIUM CHLORIDE 0.9% FLUSH: 3.0000 mL | INTRAVENOUS | Status: DC | PRN

## 2021-11-28 MED ORDER — HALOPERIDOL LACTATE 5 MG/ML IJ SOLN: 0.5000 mg | INTRAMUSCULAR | Status: DC | PRN

## 2021-11-28 MED ORDER — LORAZEPAM 2 MG/ML PO CONC: 1.0000 mg | ORAL | 0 refills | Status: AC | PRN

## 2021-11-28 MED ORDER — SODIUM CHLORIDE 0.9 % IV SOLN: 250.0000 mL | INTRAVENOUS | Status: DC | PRN

## 2021-11-28 MED ORDER — POLYVINYL ALCOHOL 1.4 % OP SOLN
1.0000 [drp] | Freq: Four times a day (QID) | OPHTHALMIC | Status: DC | PRN
  Filled 2021-11-28: qty 15

## 2021-11-28 MED ORDER — GLYCOPYRROLATE 0.2 MG/ML IJ SOLN: 0.2000 mg | INTRAMUSCULAR | Status: DC | PRN

## 2021-11-28 MED ORDER — NYSTATIN 100000 UNIT/GM EX POWD: Freq: Three times a day (TID) | CUTANEOUS | Status: DC | PRN

## 2021-11-28 MED ORDER — MORPHINE SULFATE (PF) 2 MG/ML IV SOLN: 1.0000 mg | INTRAVENOUS | 0 refills | Status: AC | PRN

## 2021-11-28 MED ORDER — HALOPERIDOL LACTATE 2 MG/ML PO CONC: 0.5000 mg | ORAL | 0 refills | Status: AC | PRN

## 2021-11-28 MED ORDER — MORPHINE SULFATE (CONCENTRATE) 10 MG/0.5ML PO SOLN: 5.0000 mg | Freq: Three times a day (TID) | ORAL | Status: DC

## 2021-11-28 MED ORDER — MORPHINE SULFATE (PF) 2 MG/ML IV SOLN: 1.0000 mg | INTRAVENOUS | Status: DC | PRN

## 2021-11-28 MED ORDER — LORAZEPAM 2 MG/ML IJ SOLN: 1.0000 mg | INTRAMUSCULAR | Status: DC | PRN

## 2021-11-28 MED ORDER — LORAZEPAM 1 MG PO TABS: 1.0000 mg | ORAL_TABLET | ORAL | Status: DC | PRN

## 2021-11-28 MED ORDER — HALOPERIDOL 0.5 MG PO TABS: 0.5000 mg | ORAL_TABLET | ORAL | Status: DC | PRN

## 2021-11-28 MED ORDER — MORPHINE SULFATE (CONCENTRATE) 10 MG/0.5ML PO SOLN: 5.0000 mg | Freq: Three times a day (TID) | ORAL | 0 refills | Status: AC

## 2021-11-28 MED ORDER — MORPHINE SULFATE (CONCENTRATE) 10 MG/0.5ML PO SOLN
5.0000 mg | Freq: Three times a day (TID) | ORAL | Status: DC
  Administered 2021-11-28: 5 mg via SUBLINGUAL
  Filled 2021-11-28: qty 0.5

## 2021-11-28 MED ORDER — HALOPERIDOL LACTATE 2 MG/ML PO CONC: 0.5000 mg | ORAL | Status: DC | PRN

## 2021-11-28 MED ORDER — SODIUM CHLORIDE 0.9% FLUSH
3.0000 mL | Freq: Two times a day (BID) | INTRAVENOUS | Status: DC
  Administered 2021-11-28: 3 mL via INTRAVENOUS

## 2021-11-28 MED ORDER — LORAZEPAM 2 MG/ML PO CONC: 1.0000 mg | ORAL | Status: DC | PRN

## 2021-11-28 MED ORDER — GLYCOPYRROLATE 1 MG PO TABS: 1.0000 mg | ORAL_TABLET | ORAL | Status: DC | PRN

## 2021-11-28 MED ORDER — OLANZAPINE 5 MG PO TABS: 20.0000 mg | ORAL_TABLET | Freq: Every day | ORAL | Status: DC

## 2021-11-28 NOTE — TOC Initial Note (Signed)
Transition of Care [facility]) - Initial/Assessment Note    Patient Details  Name: MCKENNA GAMM MRN: [id number] Date of Birth: 12-31-1950  Transition of Care [facility]) CM/SW Contact:    Benard Halsted, LCSW Phone Number: 11/28/2021, 3:05 PM  Clinical Narrative:                 CSW received request from MD after he spoke with patient's daughter for patient to be assessed for [name]. CSW sent referral to [facility] for review.   Expected Discharge Plan: [place] Barriers to Discharge: Continued Medical Work up, Hospice Bed not available   Patient Goals and CMS Choice Patient states their goals for this hospitalization and ongoing recovery are:: Comfort CMS Medicare.gov Compare Post Acute Care list provided to:: Patient Represenative (must comment) Choice offered to / list presented to : Adult Children  Expected Discharge Plan and Services Expected Discharge Plan: [place] In-house Referral: Clinical Social Work, Hospice / [place] Acute Care Choice: Hospice Living arrangements for the past 2 months: [place]                                      Prior Living Arrangements/Services Living arrangements for the past 2 months: [place] Lives with:: Facility Resident Patient language and need for interpreter reviewed:: Yes Do you feel safe going back to the place where you live?: Yes      Need for Family Participation in Patient Care: Yes (Comment) Care giver support system in place?: Yes (comment) Current home services: DME Criminal Activity/Legal Involvement Pertinent to Current Situation/Hospitalization: No - Comment as needed  Activities of Daily Living      Permission Sought/Granted Permission sought to share information with : Facility Sport and exercise psychologist, Family Supports Permission granted to share information with : Yes, Verbal Permission Granted  Share  Information with NAME: Sharyn Lull  Permission granted to share info w AGENCY: Hospice  Permission granted to share info w Relationship: Daughter  Permission granted to share info w Contact Information: [phone]  Emotional Assessment Appearance:: Appears stated age Attitude/Demeanor/Rapport: Unable to Assess Affect (typically observed): Unable to Assess Orientation: :  (Disoriented x 4) Alcohol / Substance Use: Not Applicable Psych Involvement: No (comment)  Admission diagnosis:  Acute cystitis without hematuria [N30.00] Altered mental status, unspecified altered mental status type [P82.42] Acute metabolic encephalopathy [P53.61] Patient Active Problem List   Diagnosis Date Noted   Acute metabolic encephalopathy 44/31/5400   Chronic respiratory failure with hypoxia ([place]) 11/20/2021   Severe hypothyroidism 11/13/2019   AMS (altered mental status) 11/12/2019   Memory loss 07/14/2017   Weakness 06/11/2017   Chronic pain 06/11/2017   Sepsis secondary to UTI ([place]) 05/03/2017   MRSA bacteremia    Brain aneurysm 03/13/2017   Internal carotid aneurysm    Slurred speech 03/12/2017   Hypotension 03/12/2017   Dysarthria    Left sided numbness    Acute left-sided muscle weakness 01/24/2017   Stroke (cerebrum) ([place]) 01/24/2017   Intracranial aneurysm 01/24/2017   Encounter for medication review 02/07/2016   Hypoxia, sleep related 09/07/2015   Constipation 07/30/2015   Dizziness 07/30/2015   Chronic diastolic CHF (congestive heart failure) ([place]) 06/26/2015   Bilateral lower extremity edema 06/26/2015   Mitral regurgitation 05/17/2015   Accidental drug overdose 05/17/2015   Respiratory failure with hypoxia and hypercapnia ([place]) 05/09/2015   OSA (  obstructive sleep apnea) 05/09/2015   Overdose 05/09/2015   Chronic back pain 03/20/2015   Right shoulder pain 11/14/2014   Insomnia 11/14/2014   Psychoses ([place])    Bacteremia    Pedal edema 09/12/2014   Bipolar disorder, current  episode manic without psychotic features, severe ([place])    Bipolar disorder ([place]) 09/05/2014   Bipolar affective disorder, current episode manic with psychotic symptoms ([place]) 09/05/2014   Bipolar affective disorder, manic ([place]) 09/03/2014   Encounter for preadmission testing    Head revolving around 07/27/2014   BP (high blood pressure) 05/23/2014   AF (paroxysmal atrial fibrillation) ([place]) 05/23/2014   Type 2 diabetes mellitus ([place]) 04/27/2014   Essential hypertension 04/27/2014   Hypothyroidism 04/27/2014   Tobacco abuse 04/27/2014   Bipolar I disorder ([place]) 04/20/2014   Acute respiratory failure with hypoxia ([place])    Encounter for feeding tube placement    Shortness of breath    Gout 08/25/2013   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

## 2021-11-28 NOTE — Progress Notes (Addendum)
PROGRESS NOTE        PATIENT DETAILS Name: Kelsey Hanna Age: 71 y.o. Sex: female Date of Birth: 08-14-1950 Admit Date: 11/20/2021 Admitting Physician Dessa Phi, DO PCP:Pcp, No  Brief Summary: Patient is a 71 y.o.  female with history of dementia, Parkinson's disease, chronic hypoxic respiratory failure on 2 L of oxygen, bipolar disorder-who presented to the ED with acute metabolic encephalopathy in the setting of complicated UTI.  Significant events: 10/4>> admit to [facility]-severe encephalopathy in the setting of complicated UTI. 06/30>> more lethargic/waxing/waning encephalopathy-no oral intake-discussed with family-transition to comfort care.  Significant studies: 10/4>>CXR: bibasilar infiltrates left> right 10/4>> CT head: No acute intracranial abnormality. 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 10/4>> MRI brain: No acute CVA. 10/4>> EEG: No seizures.  Significant microbiology data: 10/4>> urine culture: VRE, Proteus mirabilis 10/4>> blood culture: No growth 10/4>> COVID/influenza PCR: Negative  Procedures: None  Consults: None  Subjective: Lethargic Per RN-hardly any oral intake yesterday Spoke with daughter-more lethargic yesterday evening-not as interactive.  Objective: Vitals: Blood pressure (!) 102/49, pulse 79, temperature 98.6 F (37 C), temperature source Axillary, resp. rate 18, height '5\' 3"'$  (1.6 m), weight 77.6 kg, SpO2 93 %.   Exam: Gen Exam: Comfortable but hard to arouse this morning. HEENT:atraumatic, normocephalic Chest: B/L clear to auscultation anteriorly CVS:S1S2 regular Abdomen:soft non tender, non distended Extremities:no edema Neurology: Difficult exam due to mentation. Skin: no rash   Pertinent Labs/Radiology:    Latest Ref Rng & Units 11/27/2021   12:16 AM 11/25/2021     1:38 AM 11/23/2021    9:59 AM  CBC  WBC 4.0 - 10.5 K/uL 8.6  7.7  6.9   Hemoglobin 12.0 - 15.0 g/dL 8.5  8.1  9.6   Hematocrit 36.0 - 46.0 % 26.8  24.3  29.2   Platelets 150 - 400 K/uL 308  321  299     Lab Results  Component Value Date   NA 135 11/25/2021   K 4.4 11/25/2021   CL 103 11/25/2021   CO2 23 11/25/2021     Assessment/Plan: Acute metabolic encephalopathy Probably 2/2 UTI-superimposed on dementia Neuroimaging negative for CVA, EEG negative for seizures. Unfortunately-encephalopathy has worsened further over the past day or so. Discussed with neurology yesterday-has hypoactive delirium-apart from supportive care-no options Per nursing staff-hardly any oral intake yesterday-now today. Continue to maintain delirium precautions Extensive discussion with daughter Sharyn Lull on 10/12-patient has been in the hospital for 8-9 days with no clinical improvement, she had a very poor quality of life to begin with-patient had very clear-cut instructions about not wanting heroic/aggressive measures.  We both agree that we should transition to comfort measures at this point-as apart from supportive care really no other options.  See palliative care discussion below.  Sepsis due to complicated UTI (VRE/Proteus) Sepsis physiology resolved Stop Rocephin/amoxicillin-comfort care starting today.    Normocytic anemia Due to acute illness Hb fluctuating but relatively stable No evidence of blood loss No role to monitor CBC-as comfort care.  Parkinson's disease Continue Sinemet/Comtan-as much as possible-comfort care in progress.  History of vascular dementia Bipolar disorder Maintain delirium precautions Continue Depakote/Zoloft/Zyprexa-as long as possible-comfort care in progress.  Cervical dystonia Seems to be somewhat better-poor historian Continue Robaxin for comfort.  Add narcotics-as she still appears different somewhat when attempting to move her  neck to the right  side Mobilize with PT/OT  Chronic hypoxic respiratory failure On 2 L of oxygen at baseline  Hypothyroidism Stop Synthroid-comfort care started today.    HLD Stop--comfort care.  Peripheral neuropathy Stop Neurontin-comfort care  FOBT positive at SNF Outpatient GI referral apparently has been set up a prior notes Interestingly-FOBT negative here  Palliative care Failure to thrive syndrome DNR in place-Per patient's daughter Michelle-patient had very poor quality of life even prior to hospitalization Patient apparently had very clear-cut instructions-DNR-and did not want any life prolonging measures, Unfortunately-even after 8-9 days in the hospital-no clinical improvement.  She continues to have waxing/waning encephalopathy-hardly any oral intake for the past several days.  She is severely debilitated.  Even if she were to survive this acute illness-it is highly likely that she will not be back to her prior baseline (very poor to begin with).  After extensive discussion with patient's daughter-Michelle over the phone on 10/12-based on patient's prior expressed desire-it is reasonable to transition to full comfort measures. Given her level of frailty-debility-encephalopathy/lethargy lack of oral intake for the past several days-suspect her life expectancy is likely less than 2 weeks.  Overall prognosis is very grim.  Have asked social worker to see if we can get into residential hospice.  Obesity: Estimated body mass index is 30.3 kg/m as calculated from the following:   Height as of this encounter: '5\' 3"'$  (1.6 m).   Weight as of this encounter: 77.6 kg.   Code status:   Code Status: DNR   DVT Prophylaxis: SCDs Start: 11/20/21 2235    Family Communication:  Daughter-Michelle-[phone]-updated on 10/12   Disposition Plan: Status is: Inpatient Remains inpatient appropriate because: Waxing/waning encephalopathy-severe failure to thrive syndrome-transitioning to comfort care  on 10/12-likely residential hospice on discharge.   Planned Discharge Destination:Skilled nursing facility vs residential hospice   Diet: Diet Order             DIET DYS 2 Room service appropriate? No; Fluid consistency: Thin  Diet effective now                     Antimicrobial agents: Anti-infectives (From admission, onward)    Start     Dose/Rate Route Frequency Ordered Stop   11/25/21 1215  amoxicillin (AMOXIL) capsule 500 mg  Status:  Discontinued        500 mg Oral Every 8 hours 11/25/21 1118 11/28/21 1151   11/20/21 1930  azithromycin (ZITHROMAX) 500 mg in sodium chloride 0.9 % 250 mL IVPB        500 mg 250 mL/hr over 60 Minutes Intravenous  Once 11/20/21 1926 11/20/21 2239   11/20/21 1430  cefTRIAXone (ROCEPHIN) 2 g in sodium chloride 0.9 % 100 mL IVPB        2 g 200 mL/hr over 30 Minutes Intravenous Every 24 hours 11/20/21 1423 11/26/21 1616        MEDICATIONS: Scheduled Meds:  entacapone  200 mg Oral TID   And   carbidopa-levodopa  1 tablet Oral TID   divalproex  500 mg Oral BID   gabapentin  100 mg Oral BID   And   gabapentin  200 mg Oral QHS   morphine CONCENTRATE  5 mg Oral Q8H   Or   morphine CONCENTRATE  5 mg Sublingual Q8H   OLANZapine  20 mg Oral QHS   sertraline  50 mg Oral Daily  sodium chloride flush  3 mL Intravenous Q12H   sodium chloride flush  3 mL Intravenous Q12H   Continuous Infusions:  sodium chloride      PRN Meds:.sodium chloride, acetaminophen **OR** acetaminophen, glycopyrrolate **OR** glycopyrrolate **OR** glycopyrrolate, haloperidol **OR** haloperidol **OR** haloperidol lactate, LORazepam **OR** LORazepam **OR** LORazepam, methocarbamol, morphine injection, nystatin, ondansetron **OR** ondansetron (ZOFRAN) IV, polyvinyl alcohol, sodium chloride flush   I have personally reviewed following labs and imaging studies  LABORATORY DATA: CBC: Recent Labs  Lab 11/23/21 0959 11/25/21 0138 11/27/21 0016  WBC 6.9 7.7 8.6   HGB 9.6* 8.1* 8.5*  HCT 29.2* 24.3* 26.8*  MCV 94.8 92.4 95.7  PLT 299 321 308     Basic Metabolic Panel: Recent Labs  Lab 11/23/21 0959 11/24/21 0508 11/25/21 0138  NA 138 137 135  K 3.4* 2.9* 4.4  CL 102 99 103  CO2 '26 26 23  '$ GLUCOSE 107* 87 91  BUN '10 10 8  '$ CREATININE 0.43* 0.50 0.47  CALCIUM 8.8* 9.2 8.4*  MG  --  1.3* 2.1  PHOS  --  2.8 2.5     GFR: Estimated Creatinine Clearance: 62.7 mL/min (by C-G formula based on SCr of 0.47 mg/dL).  Liver Function Tests: No results for input(s): "AST", "ALT", "ALKPHOS", "BILITOT", "PROT", "ALBUMIN" in the last 168 hours.  No results for input(s): "LIPASE", "AMYLASE" in the last 168 hours. No results for input(s): "AMMONIA" in the last 168 hours.  Coagulation Profile: No results for input(s): "INR", "PROTIME" in the last 168 hours.   Cardiac Enzymes: No results for input(s): "CKTOTAL", "CKMB", "CKMBINDEX", "TROPONINI" in the last 168 hours.  BNP (last 3 results) No results for input(s): "PROBNP" in the last 8760 hours.  Lipid Profile: No results for input(s): "CHOL", "HDL", "LDLCALC", "TRIG", "CHOLHDL", "LDLDIRECT" in the last 72 hours.  Thyroid Function Tests: No results for input(s): "TSH", "T4TOTAL", "FREET4", "T3FREE", "THYROIDAB" in the last 72 hours.  Anemia Panel: No results for input(s): "VITAMINB12", "FOLATE", "FERRITIN", "TIBC", "IRON", "RETICCTPCT" in the last 72 hours.  Urine analysis:    Component Value Date/Time   COLORURINE AMBER (A) 11/20/2021 1330   APPEARANCEUR CLOUDY (A) 11/20/2021 1330   LABSPEC >1.046 (H) 11/20/2021 1330   PHURINE 7.0 11/20/2021 1330   GLUCOSEU NEGATIVE 11/20/2021 1330   HGBUR NEGATIVE 11/20/2021 1330   BILIRUBINUR SMALL (A) 11/20/2021 1330   KETONESUR 20 (A) 11/20/2021 1330   PROTEINUR 100 (A) 11/20/2021 1330   UROBILINOGEN 0.2 10/20/2014 0814   NITRITE NEGATIVE 11/20/2021 1330   LEUKOCYTESUR MODERATE (A) 11/20/2021 1330    Sepsis Labs: Lactic Acid, Venous     Component Value Date/Time   LATICACIDVEN 1.4 11/20/2021 1316    MICROBIOLOGY: Recent Results (from the past 240 hour(s))  Blood culture (routine x 2)     Status: None   Collection Time: 11/20/21  1:16 PM   Specimen: BLOOD LEFT FOREARM  Result Value Ref Range Status   Specimen Description BLOOD LEFT FOREARM  Final   Special Requests   Final    BOTTLES DRAWN AEROBIC AND ANAEROBIC Blood Culture adequate volume   Culture   Final    NO GROWTH 5 DAYS Performed at [facility], [place] [address] [place], [place] [address]    Report Status 11/25/2021 FINAL  Final  Urine Culture     Status: Abnormal   Collection Time: 11/20/21  1:16 PM   Specimen: In/Out Cath Urine  Result Value Ref Range Status   Specimen Description IN/OUT CATH URINE  Final   Special Requests   Final    NONE Performed at [facility], [place] [address], [place], [place] [address]    Culture (A)  Final    >=100,000 COLONIES/mL PROTEUS MIRABILIS >=100,000 COLONIES/mL ENTEROCOCCUS FAECALIS VANCOMYCIN RESISTANT ENTEROCOCCUS ISOLATED    Report Status 11/25/2021 FINAL  Final   Organism ID, Bacteria PROTEUS MIRABILIS (A)  Final   Organism ID, Bacteria ENTEROCOCCUS FAECALIS (A)  Final      Susceptibility   Enterococcus faecalis - MIC*    AMPICILLIN <=2 SENSITIVE Sensitive     NITROFURANTOIN <=16 SENSITIVE Sensitive     VANCOMYCIN >=32 RESISTANT Resistant     LINEZOLID 2 SENSITIVE Sensitive     * >=100,000 COLONIES/mL ENTEROCOCCUS FAECALIS   Proteus mirabilis - MIC*    AMPICILLIN >=32 RESISTANT Resistant     CEFAZOLIN 8 SENSITIVE Sensitive     CEFEPIME <=0.12 SENSITIVE Sensitive     CEFTRIAXONE <=0.25 SENSITIVE Sensitive     CIPROFLOXACIN <=0.25 SENSITIVE Sensitive     GENTAMICIN <=1 SENSITIVE Sensitive     IMIPENEM 2 SENSITIVE Sensitive     NITROFURANTOIN 128 RESISTANT Resistant     TRIMETH/SULFA >=320 RESISTANT Resistant     AMPICILLIN/SULBACTAM >=32 RESISTANT Resistant     PIP/TAZO <=4 SENSITIVE  Sensitive     * >=100,000 COLONIES/mL PROTEUS MIRABILIS  Blood culture (routine x 2)     Status: None   Collection Time: 11/20/21  1:21 PM   Specimen: BLOOD RIGHT FOREARM  Result Value Ref Range Status   Specimen Description BLOOD RIGHT FOREARM  Final   Special Requests   Final    BOTTLES DRAWN AEROBIC AND ANAEROBIC Blood Culture adequate volume   Culture   Final    NO GROWTH 5 DAYS Performed at [facility], [address] [place], [place] [address]    Report Status 11/25/2021 FINAL  Final  Resp Panel by RT-PCR (Flu A&B, Covid) Anterior Nasal Swab     Status: None   Collection Time: 11/20/21  3:22 PM   Specimen: Anterior Nasal Swab  Result Value Ref Range Status   SARS Coronavirus 2 by RT PCR NEGATIVE NEGATIVE Final    Comment: (NOTE) SARS-CoV-2 target nucleic acids are NOT DETECTED.  The SARS-CoV-2 RNA is generally detectable in upper respiratory specimens during the acute phase of infection. The lowest concentration of SARS-CoV-2 viral copies this assay can detect is 138 copies/mL. A negative result does not preclude SARS-Cov-2 infection and should not be used as the sole basis for treatment or other patient management decisions. A negative result may occur with  improper specimen collection/handling, submission of specimen other than nasopharyngeal swab, presence of viral mutation(s) within the areas targeted by this assay, and inadequate number of viral copies(<138 copies/mL). A negative result must be combined with clinical observations, patient history, and epidemiological information. The expected result is Negative.  Fact Sheet for Patients:  EntrepreneurPulse.com.au  Fact Sheet for Healthcare Providers:  IncredibleEmployment.be  This test is no t yet approved or cleared by the [place] FDA and  has been authorized for detection and/or diagnosis of SARS-CoV-2 by FDA under an Emergency Use Authorization (EUA). This EUA  will remain  in effect (meaning this test can be used) for the duration of the COVID-19 declaration under Section 564(b)(1) of the Act, 21 U.S.C.section 360bbb-3(b)(1), unless the authorization is terminated  or revoked sooner.       Influenza A by PCR NEGATIVE NEGATIVE Final   Influenza B  by PCR NEGATIVE NEGATIVE Final    Comment: (NOTE) The Xpert Xpress SARS-CoV-2/FLU/RSV plus assay is intended as an aid in the diagnosis of influenza from Nasopharyngeal swab specimens and should not be used as a sole basis for treatment. Nasal washings and aspirates are unacceptable for Xpert Xpress SARS-CoV-2/FLU/RSV testing.  Fact Sheet for Patients: EntrepreneurPulse.com.au  Fact Sheet for Healthcare Providers: IncredibleEmployment.be  This test is not yet approved or cleared by the [place] FDA and has been authorized for detection and/or diagnosis of SARS-CoV-2 by FDA under an Emergency Use Authorization (EUA). This EUA will remain in effect (meaning this test can be used) for the duration of the COVID-19 declaration under Section 564(b)(1) of the Act, 21 U.S.C. section 360bbb-3(b)(1), unless the authorization is terminated or revoked.  Performed at [facility], [place] [address], [place], [place] [address]     RADIOLOGY STUDIES/RESULTS: No results found.   LOS: 7 days   Oren Binet, MD  [facility]    To contact the attending provider between 7A-7P or the covering provider during after hours 7P-7A, please log into the web site [email or web address] and access using universal [place] password for that web site. If you do not have the password, please call the hospital operator.  11/28/2021, 11:55 AM

## 2021-11-28 NOTE — Discharge Summary (Signed)
PATIENT DETAILS Name: Kelsey Hanna Age: 71 y.o. Sex: female Date of Birth: 01/24/1951 MRN: [id number]. Admitting Physician: Dessa Phi, DO PCP:Pcp, No  Admit Date: 11/20/2021 Discharge date: 11/28/2021  Recommendations for Outpatient Follow-up:  Optimize comfort measures  Admitted From:  SNF  Disposition: Hospice care   Discharge Condition: poor  CODE STATUS:   Code Status: DNR   Diet recommendation:  Diet Order             Diet general           DIET DYS 2 Room service appropriate? No; Fluid consistency: Thin  Diet effective now                    Brief Summary: Patient is a 71 y.o.  female with history of dementia, Parkinson's disease, chronic hypoxic respiratory failure on 2 L of oxygen, bipolar disorder-who presented to the ED with acute metabolic encephalopathy in the setting of complicated UTI.   Significant events: 10/4>> admit to [facility]-severe encephalopathy in the setting of complicated UTI. 53/66>> more lethargic/waxing/waning encephalopathy-no oral intake-discussed with family-transition to comfort care.   Significant studies: 10/4>>CXR: bibasilar infiltrates left> right 10/4>> CT head: No acute intracranial abnormality. 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 10/4>> MRI brain: No acute CVA. 10/4>> EEG: No seizures.   Significant microbiology data: 10/4>> urine culture: VRE, Proteus mirabilis 10/4>> blood culture: No growth 10/4>> COVID/influenza PCR: Negative   Procedures: None   Consults: None  Brief Hospital Course: Acute metabolic encephalopathy Probably 2/2 UTI-superimposed on dementia Neuroimaging negative for CVA, EEG negative for seizures. Hospital course complicated by waxing/waning encephalopathy, patient never returned to baseline.  Had ongoing delirium and some amount of difficulty  putting words together.  Neurology was reconsulted-thought to have hypoactive delirium-apart from supportively-no other recommendations from neurology.   Unfortunately on 10/11 evening-patient's encephalopathy worsened.  Extensive discussion with daughter over the phone by this MD on [id number] patient's explicit prior desire for no aggressive/heroic care-lack of any meaningful clinical improvement after more than a week in the hospital-patient was transitioned to full comfort measures.  Please see prior notes.     Sepsis due to complicated UTI (VRE/Proteus) Sepsis physiology resolved Stop Rocephin/amoxicillin-comfort care starting today.     Normocytic anemia Due to acute illness Hb fluctuating but relatively stable No evidence of blood loss No role to monitor CBC-as comfort care.   Parkinson's disease Continue Sinemet/Comtan for comfort-as long as possible.  History of vascular dementia Bipolar disorder Maintain delirium precautions Continue Depakote/Zoloft/Zyprexa-as long as possible-comfort care in progress.   Cervical dystonia Seems to be somewhat better-poor historian Continue Robaxin for comfort. Add narcotics-as she still appears different somewhat when attempting to move her  neck to the right side    Chronic hypoxic respiratory failure On 2 L of oxygen at baseline   Hypothyroidism Stop Synthroid-comfort care started today.     HLD Stop statin--comfort care.   Peripheral neuropathy Stop Neurontin-comfort care   FOBT positive at SNF Outpatient GI referral apparently has been set up a prior notes Interestingly-FOBT negative here   Palliative care Failure to thrive syndrome DNR in place-Per patient's daughter Michelle-patient had very poor quality of life even prior to hospitalization Patient apparently had very clear-cut instructions-DNR-and did not want any life prolonging measures, Unfortunately-even after 8-9 days in the hospital-no clinical improvement.  She  continues to  have waxing/waning encephalopathy-hardly any oral intake for the past several days.  She is severely debilitated.  Even if she were to survive this acute illness-it is highly likely that she will not be back to her prior baseline (very poor to begin with).  After extensive discussion with patient's daughter-Michelle over the phone on 10/12-based on patient's prior expressed desire-it is reasonable to transition to full comfort measures. Given her level of frailty-debility-encephalopathy/lethargy lack of oral intake for the past several days-suspect her life expectancy is likely less than 2 weeks.  Overall prognosis is very grim.  Being transitioned to residential hospice.   Obesity: Estimated body mass index is 30.3 kg/m as calculated from the following:   Height as of this encounter: '5\' 3"'$  (1.6 m).   Weight as of this encounter: 77.6 kg.   Discharge Diagnoses:  Principal Problem:   Acute metabolic encephalopathy Active Problems:   Hypothyroidism   Bipolar disorder ([place])   HLD (hyperlipidemia)   Legal blindness [place]   Sepsis secondary to UTI ([place])   Chronic respiratory failure with hypoxia [facility])   Discharge Instructions:  Activity:  As tolerated   Discharge Instructions     Diet general   Complete by: As directed    Increase activity slowly   Complete by: As directed       Allergies as of 11/28/2021       Reactions   Lithobid [lithium] Other (See Comments)   Hallucinations, "makes me feel spaced out" and slurred speech   Naprosyn [naproxen] Other (See Comments)   Patient doesn't remember what reaction was but remembers it caused a lot of pain        Medication List     STOP taking these medications    acetaminophen 500 MG tablet Commonly known as: TYLENOL   allopurinol 100 MG tablet Commonly known as: ZYLOPRIM   atorvastatin 20 MG tablet Commonly known as: LIPITOR   ergocalciferol 1.25 MG (50000 UT) capsule Commonly known as: VITAMIN D2    gabapentin 100 MG capsule Commonly known as: NEURONTIN   levothyroxine 112 MCG tablet Commonly known as: SYNTHROID   Linzess 145 MCG Caps capsule Generic drug: linaclotide   mirtazapine 7.5 MG tablet Commonly known as: REMERON   ondansetron 4 MG tablet Commonly known as: ZOFRAN   pantoprazole 20 MG tablet Commonly known as: PROTONIX   polycarbophil 625 MG tablet Commonly known as: FIBERCON   polyethylene glycol 17 g packet Commonly known as: MIRALAX / GLYCOLAX   senna 8.6 MG Tabs tablet Commonly known as: SENOKOT   Systane Ultra PF 0.4-0.3 % Soln Generic drug: Polyethyl Glyc-Propyl Glyc PF   traMADol 50 MG tablet Commonly known as: ULTRAM   vitamin B-12 250 MCG tablet Commonly known as: CYANOCOBALAMIN       TAKE these medications    carbidopa-levodopa-entacapone 50-200-200 MG tablet Commonly known as: STALEVO Take 1 tablet by mouth 3 (three) times daily.   divalproex 125 MG capsule Commonly known as: DEPAKOTE SPRINKLE Take 500 mg by mouth 2 (two) times daily.   haloperidol 2 MG/ML solution Commonly known as: HALDOL Place 0.3 mLs (0.6 mg total) under the tongue every 4 (four) hours as needed for agitation (or delirium).   LORazepam 2 MG/ML concentrated solution Commonly known as: ATIVAN Place 0.5 mLs (1 mg total) under the tongue every 4 (four) hours as needed for anxiety.   morphine CONCENTRATE 10 MG/0.5ML Soln concentrated solution Take 0.25 mLs (5 mg total) by mouth every 8 (eight) hours.  morphine (PF) 2 MG/ML injection Inject 0.5 mLs (1 mg total) into the vein every 2 (two) hours as needed (or dyspnea).   OLANZapine 10 MG tablet Commonly known as: ZYPREXA Take 20 mg by mouth at bedtime.   sertraline 50 MG tablet Commonly known as: ZOLOFT Take 50 mg by mouth daily.        Allergies  Allergen Reactions   Lithobid [Lithium] Other (See Comments)    Hallucinations, "makes me feel spaced out" and slurred speech    Naprosyn [Naproxen]  Other (See Comments)    Patient doesn't remember what reaction was but remembers it caused a lot of pain     Other Procedures/Studies: MR BRAIN WO CONTRAST  Result Date: 11/20/2021 CLINICAL DATA:  Neuro deficit, acute, stroke suspected. EXAM: MRI HEAD WITHOUT CONTRAST TECHNIQUE: Multiplanar, multiecho pulse sequences of the brain and surrounding structures were obtained without intravenous contrast. COMPARISON:  Head CT and CTA 11/20/2021.  Head MRI 06/10/2017. FINDINGS: The study is intermittently up to moderately motion degraded. Brain: There is no evidence of an acute infarct, intracranial hemorrhage, mass, midline shift, or extra-axial fluid collection. T2 hyperintensities in the cerebral white matter bilaterally are unchanged from the prior MRI and are nonspecific but compatible with mild chronic small vessel ischemic disease. Moderately advanced cerebral atrophy has mildly progressed from the prior MRI. A cavum septum pellucidum is noted. Vascular: Major intracranial vascular flow voids are preserved. Skull and upper cervical spine: No suspicious marrow lesion. Sinuses/Orbits: Bilateral cataract extraction. Paranasal sinuses and mastoid air cells are clear. Other: None. IMPRESSION: 1. No acute intracranial abnormality. 2. Mild chronic small vessel ischemic disease and moderately advanced cerebral atrophy. Electronically Signed   By: Logan Bores M.D.   On: 11/20/2021 21:27   DG Chest Portable 1 View  Result Date: 11/20/2021 CLINICAL DATA:  Reason for exam: AMS EXAM: PORTABLE CHEST - 1 VIEW COMPARISON:  11/12/2019 FINDINGS: Low lung volumes. Linear subsegmental atelectasis versus infiltrate in the right lower lung. Atelectasis or consolidation obscures the left diaphragmatic leaflet. Can not exclude small left effusion. Heart size normal. Fracture deformity of left humerus partially visualized. IMPRESSION: Low volumes with bibasilar atelectasis or infiltrates, left worse than right. Electronically  Signed   By: Lucrezia Europe M.D.   On: 11/20/2021 16:48   EEG adult  Result Date: 11/20/2021 Lora Havens, MD     11/20/2021  4:26 PM Patient Name: SHARONLEE NINE MRN: [id number] Epilepsy Attending: Lora Havens Referring Physician/Provider: Amie [place], MD Date: 11/20/2021 Duration: 34.39 mins Patient history: 71 year old woman brought in from nursing facility for evaluation of sudden onset of unresponsiveness with EMS concerned with a leftward gaze. EEG to evaluate for seizure Level of alertness: Awake AEDs during EEG study: None Technical aspects: This EEG study was done with scalp electrodes positioned according to the 10-20 International system of electrode placement. Electrical activity was reviewed with band pass filter of 1-'70Hz'$ , sensitivity of 7 uV/mm, display speed of 20m/sec with a '60Hz'$  notched filter applied as appropriate. EEG data were recorded continuously and digitally stored.  Video monitoring was available and reviewed as appropriate. Description: No clear posterior dominant rhythm was seen. EEG showed continuous generalized 3 to 6 Hz theta-delta slowing. Patient was noted to have episodes of left upper extremity tremor like movements intermittently. Concomitant EEG before, during and after the study didn't show any eeg changes to suggest seizure. Hyperventilation and photic stimulation were not performed.   ABNORMALITY - Continuous slow, generalized IMPRESSION: This study is suggestive  of moderate diffuse encephalopathy, nonspecific etiology. No seizures or epileptiform discharges were seen throughout the recording. Patient was noted to have episodes of left upper extremity tremor like movements intermittently without concomitant EEG change. These episodes were most likely NOT epileptic. Priyanka Barbra Sarks   CT ANGIO HEAD NECK W WO CM (CODE STROKE)  Result Date: 11/20/2021 CLINICAL DATA:  Stroke suspected EXAM: CT ANGIOGRAPHY HEAD AND NECK TECHNIQUE: Multidetector CT imaging of the  head and neck was performed using the standard protocol during bolus administration of intravenous contrast. Multiplanar CT image reconstructions and MIPs were obtained to evaluate the vascular anatomy. Carotid stenosis measurements (when applicable) are obtained utilizing NASCET criteria, using the distal internal carotid diameter as the denominator. RADIATION DOSE REDUCTION: This exam was performed according to the departmental dose-optimization program which includes automated exposure control, adjustment of the mA and/or kV according to patient size and/or use of iterative reconstruction technique. CONTRAST:  85m OMNIPAQUE IOHEXOL 350 MG/ML SOLN COMPARISON:  None Available. FINDINGS: CT HEAD FINDINGS Brain: No evidence of acute infarction, hemorrhage, hydrocephalus, extra-axial collection or mass lesion/mass effect. Vascular: See below Skull: Normal. Negative for fracture or focal lesion. Sinuses/Orbits: Bilateral lens replacements. Other: None. Review of the MIP images confirms the above findings CTA NECK FINDINGS Aortic arch: Standard branching. Imaged portion shows no evidence of aneurysm or dissection. No significant stenosis of the major arch vessel origins. Right carotid system: No evidence of dissection, stenosis (50% or greater), or occlusion. Left carotid system: No evidence of dissection, stenosis (50% or greater), or occlusion. Vertebral arteries: Codominant. No evidence of dissection, stenosis (50% or greater), or occlusion. Skeleton: Negative. Other neck: Negative. Upper chest: Small left pleural effusion. Mild centrilobular emphysema. Review of the MIP images confirms the above findings CTA HEAD FINDINGS Anterior circulation: Redemonstrated right cavernous ICA stent with mild narrowing of the distal end. 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 Redemonstrated mild narrowing of the proximal aspect  of the M2 segment on the right (series 7, image 126 Posterior circulation: Right is hypoplastic. The right P2 is predominantly supplied by the right PCOM. This is unchanged compared to MRA from 2019. Venous sinuses: As permitted by contrast timing, patent. Anatomic variants: As above Review of the MIP images confirms the above findings IMPRESSION: 1. No acute intracranial process. 2. No hemodynamically significant stenosis in the neck. 3. Redemonstrated right cavernous ICA stent with mild narrowing of the distal end. 4. 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 5. Visualized upper chest is notable for centrilobular emphysema and a small left pleural effusion. Emphysema (ICD10-J43.9). Electronically Signed   By: HMarin RobertsM.D.   On: 11/20/2021 13:25   CT HEAD CODE STROKE WO CONTRAST  Result Date: 11/20/2021 CLINICAL DATA:  Code stroke. Neuro deficit, acute, stroke suspected. Left gaze. Generalized weakness. Patient is nonverbal. EXAM: CT HEAD WITHOUT CONTRAST TECHNIQUE: Contiguous axial images were obtained from the base of the skull through the vertex without intravenous contrast. RADIATION DOSE REDUCTION: This exam was performed according to the departmental dose-optimization program which includes automated exposure control, adjustment of the mA and/or kV according to patient size and/or use of iterative reconstruction technique. COMPARISON:  CT head without contrast 11/12/2019 FINDINGS: Brain: Moderate generalized atrophy and white matter disease is stable. No acute infarct, hemorrhage, or mass lesion is present. Basal ganglia are within normal limits. Insular  ribbon is normal. No acute or focal cortical abnormality is present. Cavum septum pellucidum is present. The ventricles are proportionate to the degree of atrophy. No significant extraaxial fluid collection is present. The brainstem and cerebellum are within normal  limits. Vascular: A stent is present within the cavernous right ICA. Hyperdense vessel is present. Skull: Calvarium is intact. No focal lytic or blastic lesions are present. No significant extracranial soft tissue lesion is present. Sinuses/Orbits: The paranasal sinuses and mastoid air cells are clear. Bilateral lens replacements are noted. Globes and orbits are otherwise unremarkable. ASPECTS [facility] Stroke Program Early CT Score) - Ganglionic level infarction (caudate, lentiform nuclei, internal capsule, insula, M1-M3 cortex): 7/7 - Supraganglionic infarction (M4-M6 cortex): 3/3 Total score (0-10 with 10 being normal): 10/10 IMPRESSION: 1. No acute intracranial abnormality or significant interval change. 2. Stable atrophy and white matter disease. This likely reflects the sequela of chronic microvascular ischemia. 3. Stent within the cavernous right ICA. 4. Aspects is 10/10. The above was relayed via text pager to Dr. Rory Percy on 11/20/2021 at 13:05 . Electronically Signed   By: San Morelle M.D.   On: 11/20/2021 13:05     TODAY-DAY OF DISCHARGE:  Subjective:   Claudell Rhody today remains lethargic.  Objective:   Blood pressure (!) 103/57, pulse 75, temperature 98.8 F (37.1 C), temperature source Axillary, resp. rate 18, height '5\' 3"'$  (1.6 m), weight 77.6 kg, SpO2 94 %.  Intake/Output Summary (Last 24 hours) at 11/28/2021 1656 Last data filed at 11/28/2021 0600 Gross per 24 hour  Intake --  Output 436 ml  Net -436 ml   Filed Weights   11/20/21 1305 11/27/21 1613 11/28/21 0600  Weight: 78 kg 81.1 kg 77.6 kg    Exam: Lethargic/hard to arouse [place].AT,PERRAL Supple Neck,No JVD, No cervical lymphadenopathy appriciated.  Symmetrical Chest wall movement, Good air movement bilaterally, CTAB RRR,No Gallops,Rubs or new Murmurs, No Parasternal Heave +ve B.Sounds, Abd Soft, Non tender, No organomegaly appriciated, No rebound -guarding or rigidity. No Cyanosis, Clubbing or edema, No new  Rash or bruise   PERTINENT RADIOLOGIC STUDIES: No results found.   PERTINENT LAB RESULTS: CBC: Recent Labs    11/27/21 0016  WBC 8.6  HGB 8.5*  HCT 26.8*  PLT 308   CMET CMP     Component Value Date/Time   NA 135 11/25/2021 0138   K 4.4 11/25/2021 0138   CL 103 11/25/2021 0138   CO2 23 11/25/2021 0138   GLUCOSE 91 11/25/2021 0138   BUN 8 11/25/2021 0138   CREATININE 0.47 11/25/2021 0138   CALCIUM 8.4 (L) 11/25/2021 0138   PROT 5.8 (L) 11/20/2021 1255   ALBUMIN 2.7 (L) 11/20/2021 1255   AST 35 11/20/2021 1255   ALT 6 11/20/2021 1255   ALKPHOS 72 11/20/2021 1255   BILITOT 1.2 11/20/2021 1255   GFRNONAA >60 11/25/2021 0138   GFRAA >60 11/14/2019 0851    GFR Estimated Creatinine Clearance: 62.7 mL/min (by C-G formula based on SCr of 0.47 mg/dL). No results for input(s): "LIPASE", "AMYLASE" in the last 72 hours. No results for input(s): "CKTOTAL", "CKMB", "CKMBINDEX", "TROPONINI" in the last 72 hours. Invalid input(s): "POCBNP" No results for input(s): "DDIMER" in the last 72 hours. No results for input(s): "HGBA1C" in the last 72 hours. No results for input(s): "CHOL", "HDL", "LDLCALC", "TRIG", "CHOLHDL", "LDLDIRECT" in the last 72 hours. No results for input(s): "TSH", "T4TOTAL", "T3FREE", "THYROIDAB" in the last 72 hours.  Invalid input(s): "FREET3" No results for input(s): "VITAMINB12", "FOLATE", "FERRITIN", "TIBC", "  IRON", "RETICCTPCT" in the last 72 hours. Coags: No results for input(s): "INR" in the last 72 hours.  Invalid input(s): "PT" Microbiology: Recent Results (from the past 240 hour(s))  Blood culture (routine x 2)     Status: None   Collection Time: 11/20/21  1:16 PM   Specimen: BLOOD LEFT FOREARM  Result Value Ref Range Status   Specimen Description BLOOD LEFT FOREARM  Final   Special Requests   Final    BOTTLES DRAWN AEROBIC AND ANAEROBIC Blood Culture adequate volume   Culture   Final    NO GROWTH 5 DAYS Performed at [facility], [address], [place], [place] [address]    Report Status 11/25/2021 FINAL  Final  Urine Culture     Status: Abnormal   Collection Time: 11/20/21  1:16 PM   Specimen: In/Out Cath Urine  Result Value Ref Range Status   Specimen Description IN/OUT CATH URINE  Final   Special Requests   Final    NONE Performed at [facility], [place] [address] [place], [place] [address]    Culture (A)  Final    >=100,000 COLONIES/mL PROTEUS MIRABILIS >=100,000 COLONIES/mL ENTEROCOCCUS FAECALIS VANCOMYCIN RESISTANT ENTEROCOCCUS ISOLATED    Report Status 11/25/2021 FINAL  Final   Organism ID, Bacteria PROTEUS MIRABILIS (A)  Final   Organism ID, Bacteria ENTEROCOCCUS FAECALIS (A)  Final      Susceptibility   Enterococcus faecalis - MIC*    AMPICILLIN <=2 SENSITIVE Sensitive     NITROFURANTOIN <=16 SENSITIVE Sensitive     VANCOMYCIN >=32 RESISTANT Resistant     LINEZOLID 2 SENSITIVE Sensitive     * >=100,000 COLONIES/mL ENTEROCOCCUS FAECALIS   Proteus mirabilis - MIC*    AMPICILLIN >=32 RESISTANT Resistant     CEFAZOLIN 8 SENSITIVE Sensitive     CEFEPIME <=0.12 SENSITIVE Sensitive     CEFTRIAXONE <=0.25 SENSITIVE Sensitive     CIPROFLOXACIN <=0.25 SENSITIVE Sensitive     GENTAMICIN <=1 SENSITIVE Sensitive     IMIPENEM 2 SENSITIVE Sensitive     NITROFURANTOIN 128 RESISTANT Resistant     TRIMETH/SULFA >=320 RESISTANT Resistant     AMPICILLIN/SULBACTAM >=32 RESISTANT Resistant     PIP/TAZO <=4 SENSITIVE Sensitive     * >=100,000 COLONIES/mL PROTEUS MIRABILIS  Blood culture (routine x 2)     Status: None   Collection Time: 11/20/21  1:21 PM   Specimen: BLOOD RIGHT FOREARM  Result Value Ref Range Status   Specimen Description BLOOD RIGHT FOREARM  Final   Special Requests   Final    BOTTLES DRAWN AEROBIC AND ANAEROBIC Blood Culture adequate volume   Culture   Final    NO GROWTH 5 DAYS Performed at [facility], [address] [place], [place] [address]    Report Status 11/25/2021  FINAL  Final  Resp Panel by RT-PCR (Flu A&B, Covid) Anterior Nasal Swab     Status: None   Collection Time: 11/20/21  3:22 PM   Specimen: Anterior Nasal Swab  Result Value Ref Range Status   SARS Coronavirus 2 by RT PCR NEGATIVE NEGATIVE Final    Comment: (NOTE) SARS-CoV-2 target nucleic acids are NOT DETECTED.  The SARS-CoV-2 RNA is generally detectable in upper respiratory specimens during the acute phase of infection. The lowest concentration of SARS-CoV-2 viral copies this assay can detect is 138 copies/mL. A negative result does not preclude SARS-Cov-2 infection and should not be used as the sole basis for treatment or  other patient management decisions. A negative result may occur with  improper specimen collection/handling, submission of specimen other than nasopharyngeal swab, presence of viral mutation(s) within the areas targeted by this assay, and inadequate number of viral copies(<138 copies/mL). A negative result must be combined with clinical observations, patient history, and epidemiological information. The expected result is Negative.  Fact Sheet for Patients:  EntrepreneurPulse.com.au  Fact Sheet for Healthcare Providers:  IncredibleEmployment.be  This test is no t yet approved or cleared by the [place] FDA and  has been authorized for detection and/or diagnosis of SARS-CoV-2 by FDA under an Emergency Use Authorization (EUA). This EUA will remain  in effect (meaning this test can be used) for the duration of the COVID-19 declaration under Section 564(b)(1) of the Act, 21 U.S.C.section 360bbb-3(b)(1), unless the authorization is terminated  or revoked sooner.       Influenza A by PCR NEGATIVE NEGATIVE Final   Influenza B by PCR NEGATIVE NEGATIVE Final    Comment: (NOTE) The Xpert Xpress SARS-CoV-2/FLU/RSV plus assay is intended as an aid in the diagnosis of influenza from Nasopharyngeal swab specimens and should not  be used as a sole basis for treatment. Nasal washings and aspirates are unacceptable for Xpert Xpress SARS-CoV-2/FLU/RSV testing.  Fact Sheet for Patients: EntrepreneurPulse.com.au  Fact Sheet for Healthcare Providers: IncredibleEmployment.be  This test is not yet approved or cleared by the [place] FDA and has been authorized for detection and/or diagnosis of SARS-CoV-2 by FDA under an Emergency Use Authorization (EUA). This EUA will remain in effect (meaning this test can be used) for the duration of the COVID-19 declaration under Section 564(b)(1) of the Act, 21 U.S.C. section 360bbb-3(b)(1), unless the authorization is terminated or revoked.  Performed at [facility], [place] [address], [place], [place] [address]     FURTHER DISCHARGE INSTRUCTIONS:  Get Medicines reviewed and adjusted: Please take all your medications with you for your next visit with your Primary MD  Laboratory/radiological data: Please request your Primary MD to go over all hospital tests and procedure/radiological results at the follow up, please ask your Primary MD to get all Hospital records sent to his/her office.  In some cases, they will be blood work, cultures and biopsy results pending at the time of your discharge. Please request that your primary care M.D. goes through all the records of your hospital data and follows up on these results.  Also Note the following: If you experience worsening of your admission symptoms, develop shortness of breath, life threatening emergency, suicidal or homicidal thoughts you must seek medical attention immediately by calling 911 or calling your MD immediately  if symptoms less severe.  You must read complete instructions/literature along with all the possible adverse reactions/side effects for all the Medicines you take and that have been prescribed to you. Take any new Medicines after you have completely understood and  accpet all the possible adverse reactions/side effects.   Do not drive when taking Pain medications or sleeping medications (Benzodaizepines)  Do not take more than prescribed Pain, Sleep and Anxiety Medications. It is not advisable to combine anxiety,sleep and pain medications without talking with your primary care practitioner  Special Instructions: If you have smoked or chewed Tobacco  in the last 2 yrs please stop smoking, stop any regular Alcohol  and or any Recreational drug use.  Wear Seat belts while driving.  Please note: You were cared for by a hospitalist during your hospital stay. Once you are discharged, your  primary care physician will handle any further medical issues. Please note that NO REFILLS for any discharge medications will be authorized once you are discharged, as it is imperative that you return to your primary care physician (or establish a relationship with a primary care physician if you do not have one) for your post hospital discharge needs so that they can reassess your need for medications and monitor your lab values.  Total Time spent coordinating discharge including counseling, education and face to face time equals greater than 30 minutes.  SignedOren Binet 11/28/2021 4:56 PM

## 2021-11-28 NOTE — Progress Notes (Signed)
Dr Bridgett Larsson update bp 110/70 manual and left arm

## 2021-11-28 NOTE — TOC Transition Note (Signed)
Transition of Care [facility]) - CM/SW Discharge Note   Patient Details  Name: Kelsey Hanna MRN: [id number] Date of Birth: July 20, 1950  [facility] CM/SW Contact:  Benard Halsted, LCSW Phone Number: 11/28/2021, 5:37 PM   Clinical Narrative:    Patient will DC to: [facility] Anticipated DC date: 11/28/21 Family notified: Daughter, Arts administrator by: Corey Harold   Per MD patient ready for DC to [facility]. RN to call report prior to discharge [phone] RN, patient, patient's family, and facility notified of DC. Discharge Summary sent to facility. DC packet on chart. Ambulance transport requested for patient.   CSW will sign off for now as social work intervention is no longer needed. Please consult [place] again if new needs arise.     Final next level of care: [place] Barriers to Discharge: Barriers Resolved   Patient Goals and CMS Choice Patient states their goals for this hospitalization and ongoing recovery are:: Comfort CMS Medicare.gov Compare Post Acute Care list provided to:: Patient Represenative (must comment) Choice offered to / list presented to : Adult Children  Discharge Placement                Patient to be transferred to facility by: [place] Name of family member notified: Daughter Patient and family notified of of transfer: 11/28/21  Discharge Plan and Services In-house Referral: Clinical Social Work, Hospice / Palliative Care   Post Acute Care Choice: Hospice                               Social Determinants of Health (SDOH) Interventions     Readmission Risk Interventions     No data to display

## 2021-11-28 NOTE — Progress Notes (Signed)
Occupational Therapy Treatment Patient Details Name: Kelsey Hanna MRN: [id number] DOB: [birth year] Today's Date: 11/28/2021   History of present illness 71 y.o. female who arrived 10/4 via EMS from her SNF with L gaze, nonverbal, and decreased responsiveness. CT negative. Pt admitted with dx of acute metabolic encephalopathy. PMH:  vascular dementia, hypothyroidism, depression, bipolar disorder, dyslipidemia, chronic hypoxemic respiratory failure on 2 L oxygen, Parkinson's disease   OT comments  Pt limited by increased agitation during session; PAINAD 6. Planned to trial [facility] lift to chair but ultimately deemed inappropriate this session. Pt answering most questions with terse "no." Completed some bed level activity. Rolled to left side to reposition. Pt may benefit from Prevalon boots and pressure redistributing mattress/bed d/t essentially being bed bound at this time. Still unclear PLOF. Attempted to reach out to therapy team at [facility] but no one answered. D/c recommendation remains appropriate   Recommendations for follow up therapy are one component of a multi-disciplinary discharge planning process, led by the attending physician.  Recommendations may be updated based on patient status, additional functional criteria and insurance authorization.    Follow Up Recommendations  Skilled nursing-short term rehab (<3 hours/day)    Assistance Recommended at Discharge Frequent or constant Supervision/Assistance  Patient can return home with the following  Two people to help with walking and/or transfers;Two people to help with bathing/dressing/bathroom;Assistance with cooking/housework;Assistance with feeding;Direct supervision/assist for medications management;Help with stairs or ramp for entrance;Assist for transportation;Direct supervision/assist for financial management   Equipment Recommendations  Other (comment) (defer to next venue)    Recommendations for Other Services       Precautions / Restrictions Precautions Precautions: Fall Restrictions Weight Bearing Restrictions: No       Mobility Bed Mobility Overal bed mobility: Needs Assistance Bed Mobility: Rolling Rolling: Total assist, +2 for physical assistance, +2 for safety/equipment         General bed mobility comments: rolled to left side for repositioning and pressure relief    Transfers                   General transfer comment: unable     Balance                                           ADL either performed or assessed with clinical judgement   ADL Overall ADL's : Needs assistance/impaired     Grooming: Wash/dry hands;Total assistance;Bed level                                 General ADL Comments: Hands washed and rolled washcloth placed in each hand d/t clenched fists position    Extremity/Trunk Assessment Upper Extremity Assessment Upper Extremity Assessment: RUE deficits/detail;LUE deficits/detail;Difficult to assess due to impaired cognition RUE Deficits / Details: no active movement noted, tone in R hand, edematous. difficult to assess due to cognition RUE Coordination: decreased fine motor;decreased gross motor LUE Deficits / Details: able to hold forearm up against gravity when placed, PROM WFL, edematous, 1/5 grasp. difficult to assess due to cognition LUE Coordination: decreased fine motor;decreased gross motor            Vision   Additional Comments: L gaze preference. 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   Perception     Praxis  Cognition Arousal/Alertness: Awake/alert Behavior During Therapy: Flat affect, Agitated, Anxious (Irritated) Overall Cognitive Status: No family/caregiver present to determine baseline cognitive functioning                                 General Comments: Inititally relaxed demeanor becoming increasingly irritated during bed level assessment. Tried to  incoporate quiet moments of rest d/t cognition with limited result. Answering no to most questions even when rephrased.        Exercises      Shoulder Instructions       General Comments      Pertinent Vitals/ Pain       Pain Assessment Pain Assessment: PAINAD Faces Pain Scale: Hurts even more Breathing: normal Negative Vocalization: none Facial Expression: facial grimacing Body Language: rigid, fists clenched, knees up, pushing/pulling away, strikes out Consolability: unable to console, distract or reassure PAINAD Score: 6 Pain Location: unspecified Pain Descriptors / Indicators: Grimacing, Guarding, Discomfort Pain Intervention(s): Monitored during session, Limited activity within patient's tolerance, Repositioned, Other (comment) (notified nursing)  Home Living                                          Prior Functioning/Environment              Frequency  Min 2X/week        Progress Toward Goals  OT Goals(current goals can now be found in the care plan section)  Progress towards OT goals: Not progressing toward goals - comment  Acute Rehab OT Goals Patient Stated Goal: none stated OT Goal Formulation: Patient unable to participate in goal setting Time For Goal Achievement: 12/08/21 Potential to Achieve Goals: Fair ADL Goals Pt Will Perform Eating: with min assist;with adaptive utensils;bed level;sitting;with caregiver independent in assisting Pt Will Perform Grooming: with min assist;bed level;with adaptive equipment;sitting;with caregiver independent in assisting Additional ADL Goal #1: pt will perform bed mobility max +2 in prep for ADLs  Plan Discharge plan remains appropriate    Co-evaluation    PT/OT/SLP Co-Evaluation/Treatment: Yes Reason for Co-Treatment: Complexity of the patient's impairments (multi-system involvement);Necessary to address cognition/behavior during functional activity;For patient/therapist safety   OT goals  addressed during session: ADL's and self-care;Strengthening/ROM      AM-PAC OT "6 Clicks" Daily Activity     Outcome Measure   Help from another person eating meals?: Total Help from another person taking care of personal grooming?: Total Help from another person toileting, which includes using toliet, bedpan, or urinal?: Total Help from another person bathing (including washing, rinsing, drying)?: Total Help from another person to put on and taking off regular upper body clothing?: Total Help from another person to put on and taking off regular lower body clothing?: Total 6 Click Score: 6    End of Session    OT Visit Diagnosis: Unsteadiness on feet (R26.81);Other abnormalities of gait and mobility (R26.89);Muscle weakness (generalized) (M62.81);Other symptoms and signs involving cognitive function   Activity Tolerance Treatment limited secondary to agitation;Patient limited by pain;Other (comment) (PAINAD 6)   Patient Left in bed;with call bell/phone within reach;with bed alarm set   Nurse Communication Precautions;Other (comment) (consider prevalon boots and air pressure distributing bed d/t currently bed bound)        Time: 7092-9574 OT Time Calculation (min): 23 min  Charges: OT General Charges $OT Visit: 1  Visit OT Treatments $Self Care/Home Management : 8-22 mins  Tyrone Schimke, OT Acute Rehabilitation Services Office: [phone]   Hortencia Pilar 11/28/2021, 11:39 AM

## 2021-11-28 NOTE — Progress Notes (Signed)
   11/28/21 1755  Hand-Off documentation  Handoff Given Given to Transfer Unit/facility [facility])  Report given to (Full Name) Jeannene Patella, RN

## 2021-11-28 NOTE — Progress Notes (Signed)
Physical Therapy Treatment Patient Details Name: Kelsey Hanna MRN: [id number] DOB: 1950/05/25 Today's Date: 11/28/2021   History of Present Illness 71 y.o. female who arrived 10/4 via EMS from her SNF with L gaze, nonverbal, and decreased responsiveness. CT negative. Pt admitted with dx of acute metabolic encephalopathy. PMH:  vascular dementia, hypothyroidism, depression, bipolar disorder, dyslipidemia, chronic hypoxemic respiratory failure on 2 L oxygen, Parkinson's disease.    PT Comments    Pt received in supine, agreeable to therapy session initially with stating "yes" in response to questions about moving in bed and repositioning. Pt responding to <10% of simple 1-step commands and maintains L cx posture/gaze throughout, pt repositioned to her L side for pressure relief and becoming more agitated with UE/LE AAROM. RN notified pt may need pain medicine as agitation occurred after ROM. Pt unable to localize pain location and bed linens appear dry. Pt may benefit from Prevalon boots and pressure redistributing mattress (air bed) due to limited mobility and pt unable to relieve pressure on her own given cognition. Unclear PLOF and [facility] not answering phone when called this date. Pt continues to benefit from PT services to progress toward functional mobility goals.   Recommendations for follow up therapy are one component of a multi-disciplinary discharge planning process, led by the attending physician.  Recommendations may be updated based on patient status, additional functional criteria and insurance authorization.  Follow Up Recommendations  Skilled nursing-short term rehab (<3 hours/day) Can patient physically be transported by private vehicle: No   Assistance Recommended at Discharge Frequent or constant Supervision/Assistance  Patient can return home with the following Other (comment) (totalA +2 for all care tasks)   Equipment Recommendations  None recommended by  PT;Other (comment) (mechanical lift/hospital bed with air cushion for pressure relief would be beneficial depending on disposition plan)    Recommendations for Other Services       Precautions / Restrictions Precautions Precautions: Fall Precaution Comments: Contact; skin breakdown on LUE under BP cuff; consistent L gaze Restrictions Weight Bearing Restrictions: No     Mobility  Bed Mobility Overal bed mobility: Needs Assistance Bed Mobility: Rolling Rolling: Total assist, +2 for physical assistance, +2 for safety/equipment         General bed mobility comments: rolled to left side for repositioning and pressure relief, pillow placed behind her R side; totalA +2 for posterior supine scooting toward [facility]    Transfers                   General transfer comment: unable; pt not following commands and agitated with minimal bed mobility tasks    Ambulation/Gait                   Stairs             Wheelchair Mobility    Modified Rankin (Stroke Patients Only)       Balance Overall balance assessment: Needs assistance   Sitting balance-Leahy Scale: Zero Sitting balance - Comments: heavy push into extension while repositioning in supine, unsafe to attempt EOB, bed in chair posture to promote pt wakefulness                                    Cognition Arousal/Alertness: Awake/alert Behavior During Therapy: Flat affect, Agitated, Anxious (Irritated) Overall Cognitive Status: No family/caregiver present to determine baseline cognitive functioning  General Comments: Inititally relaxed demeanor becoming increasingly irritated during bed level assessment. Tried to incoporate quiet moments of rest d/t cognition with limited result. Answering no to most questions even when rephrased. Pt frequently moving BUE and B ankles with clenched fists throughout, likely restlessness related to  parkinsons/dementia.        Exercises Other Exercises Other Exercises: supine BLE AROM: ankle pumps >20, appearing spontaneous after pt initially cued to demonstrate, heel slides AA x5 reps, hip abd ~3 reps, pt resistant Other Exercises: supine BUE AAROM: elbow flex/ext, shoulder flex (limited ROM within tolerance for repositioning/comfort), finger extension AAROM  x5 reps ea    General Comments General comments (skin integrity, edema, etc.): Reviewed pressure relief strategies with pt and RN, offloaded her toward her L side and RN/NT encouraged to roll her to to R in ~2 hours, heels floated and BUE elevated for comfort given edema; cleaned with antibacterial wipe between her fingers which remain clenched and had yeasty odor and washcloths in B hands to protect her skin given B hand clenching.      Pertinent Vitals/Pain Pain Assessment Pain Assessment: PAINAD Faces Pain Scale: Hurts even more Breathing: normal Negative Vocalization: occasional moan/groan, low speech, negative/disapproving quality Facial Expression: facial grimacing Body Language: rigid, fists clenched, knees up, pushing/pulling away, strikes out Consolability: unable to console, distract or reassure PAINAD Score: 7 Pain Location: unspecified Pain Descriptors / Indicators: Grimacing, Guarding, Discomfort Pain Intervention(s): Monitored during session, Limited activity within patient's tolerance, Repositioned (RN notified of pt likely in pain given PAINAD score)     PT Goals (current goals can now be found in the care plan section) Acute Rehab PT Goals Patient Stated Goal: unable to state PT Goal Formulation: Patient unable to participate in goal setting Time For Goal Achievement: 12/08/21 Progress towards PT goals: Progressing toward goals    Frequency    Min 2X/week      PT Plan Current plan remains appropriate    Co-evaluation PT/OT/SLP Co-Evaluation/Treatment: Yes Reason for Co-Treatment: Complexity  of the patient's impairments (multi-system involvement);Necessary to address cognition/behavior during functional activity;For patient/therapist safety PT goals addressed during session: Mobility/safety with mobility;Strengthening/ROM OT goals addressed during session: ADL's and self-care;Strengthening/ROM      AM-PAC PT "6 Clicks" Mobility   Outcome Measure  Help needed turning from your back to your side while in a flat bed without using bedrails?: Total Help needed moving from lying on your back to sitting on the side of a flat bed without using bedrails?: Total Help needed moving to and from a bed to a chair (including a wheelchair)?: Total Help needed standing up from a chair using your arms (e.g., wheelchair or bedside chair)?: Total Help needed to walk in hospital room?: Total Help needed climbing 3-5 steps with a railing? : Total 6 Click Score: 6    End of Session   Activity Tolerance: Treatment limited secondary to agitation;Patient limited by pain;Other (comment) (per PAINAD pt agitated maybe due to pain/discomfort, repositioned as able for pressure relief) Patient left: in bed;with call bell/phone within reach;with bed alarm set;Other (comment) (HOB >30 deg to prevent risk of apiration and promote wakefulness, heels floated, BUE elevated for edema reduction, pillow under L side of head and posterior to promote neutral cx posture, sidelying partially to her L) Nurse Communication: Mobility status;Need for lift equipment;Patient requests pain meds;Other (comment) (appearing uncomfortable; mildly agitated; needs to be rolled q2H; lift pad in the room) PT Visit Diagnosis: Other abnormalities of gait and mobility (R26.89)  Time: 1020-1052 PT Time Calculation (min) (ACUTE ONLY): 32 min  Charges:  $Therapeutic Activity: 8-22 mins                     Christop Hippert P., PTA Acute Rehabilitation Services Secure Chat Preferred 9a-5:30pm Office: [place] 11/28/2021,  1:06 PM

## 2021-11-28 NOTE — Plan of Care (Signed)

## 2021-11-28 NOTE — H&P (Addendum)
[place] [facility]) Hosptial Referral Note:  Referral received from Dr. Sloan Leiter for Kelsey Hanna, diagnosed with Acute Metabolic Encephalopathy, sepsis r/t UTI.  Patient has been treated with IV antibiotics for UTI but has not improved.  Patients daughter has agreed to comfort care measures.    Visited Kelsey Hanna at the bedside. Patient was resting comfortably, eyes closed.  Patient appeared pale, skin cool to touch.  Patient is slow to respond to questions, only answering yes or no.  Food on bedside table untouched.    Call to daughter Kelsey Hanna to confirm interest in hospice care at inpatient unit, [facility].  Hospice services and philosophy explained. Bed has been accepted. TOC, Nadia aware of transfer this evening.  RN please call report to [facility] [phone] prior to patient transport.    Please send signed and completed DNR with patient at discharge.     Thank you for the opportunity to participate in this patient's care.    Kenna Gilbert BSN, RN  [facility] [phone]

## 2021-11-28 NOTE — Progress Notes (Signed)
   11/28/21 1122  Provider Notification  Provider Name/Title Sloan Leiter, MD  Date Provider Notified 11/28/21  Time Provider Notified 1123  Method of Notification  (secure chat)  Notification Reason Red med refusal

## 2021-11-28 NOTE — Progress Notes (Signed)
Transportation arrived to take patient to facility. Patient in bed and [place] removed. All other lines were removed on prior shift. Patient does not appear in any distress and VSS. All belongings sent with patient and AVS printed for transport.

## 2021-11-28 NOTE — Progress Notes (Signed)
Telephone call to [phone] left message to call if necessary

## 2021-12-16 ENCOUNTER — Ambulatory Visit: Payer: Medicare Other | Admitting: Gastroenterology

## 2021-12-18 DEATH — deceased
